# Patient Record
Sex: Female | Born: 1960 | Race: White | Hispanic: No | Marital: Married | Smoking: Former smoker
Health system: Southern US, Community
[De-identification: ages and names within clinical notes are randomized; demographics above are authoritative.]

## PROBLEM LIST (undated history)

## (undated) DIAGNOSIS — R1900 Intra-abdominal and pelvic swelling, mass and lump, unspecified site: Secondary | ICD-10-CM

## (undated) DIAGNOSIS — B029 Zoster without complications: Secondary | ICD-10-CM

## (undated) DIAGNOSIS — R05 Cough: Secondary | ICD-10-CM

## (undated) DIAGNOSIS — N200 Calculus of kidney: Secondary | ICD-10-CM

## (undated) DIAGNOSIS — M1990 Unspecified osteoarthritis, unspecified site: Secondary | ICD-10-CM

## (undated) DIAGNOSIS — B379 Candidiasis, unspecified: Secondary | ICD-10-CM

## (undated) DIAGNOSIS — C9001 Multiple myeloma in remission: Secondary | ICD-10-CM

## (undated) DIAGNOSIS — C9002 Multiple myeloma in relapse: Secondary | ICD-10-CM

## (undated) DIAGNOSIS — R300 Dysuria: Secondary | ICD-10-CM

## (undated) HISTORY — DX: Intra-abdominal and pelvic swelling, mass and lump, unspecified site: R19.00

## (undated) HISTORY — DX: Zoster without complications: B02.9

## (undated) HISTORY — DX: Multiple myeloma in remission: C90.01

## (undated) HISTORY — DX: Candidiasis, unspecified: B37.9

## (undated) HISTORY — DX: Multiple myeloma in relapse: C90.02

## (undated) HISTORY — PX: OTHER SURGICAL HISTORY: SHX169

## (undated) HISTORY — DX: Dysuria: R30.0

## (undated) HISTORY — PX: BACK SURGERY: SHX140

## (undated) HISTORY — DX: Cough: R05

---

## 1989-03-10 HISTORY — PX: PILONIDAL CYST DRAINAGE: SHX743

## 1998-09-10 ENCOUNTER — Inpatient Hospital Stay (HOSPITAL_COMMUNITY): Admission: EM | Admit: 1998-09-10 | Discharge: 1998-09-11 | Payer: Self-pay | Admitting: Emergency Medicine

## 2007-07-12 ENCOUNTER — Encounter: Admission: RE | Admit: 2007-07-12 | Discharge: 2007-07-12 | Payer: Self-pay | Admitting: Family Medicine

## 2007-07-15 ENCOUNTER — Ambulatory Visit: Payer: Self-pay | Admitting: Oncology

## 2007-07-15 ENCOUNTER — Inpatient Hospital Stay (HOSPITAL_COMMUNITY): Admission: AD | Admit: 2007-07-15 | Discharge: 2007-07-23 | Payer: Self-pay | Admitting: Neurosurgery

## 2007-07-21 ENCOUNTER — Encounter: Payer: Self-pay | Admitting: Oncology

## 2007-07-21 ENCOUNTER — Ambulatory Visit: Payer: Self-pay | Admitting: Oncology

## 2007-07-22 ENCOUNTER — Encounter (INDEPENDENT_AMBULATORY_CARE_PROVIDER_SITE_OTHER): Payer: Self-pay | Admitting: Neurological Surgery

## 2007-07-30 ENCOUNTER — Encounter: Admission: RE | Admit: 2007-07-30 | Discharge: 2007-07-30 | Payer: Self-pay | Admitting: Neurosurgery

## 2007-08-06 ENCOUNTER — Ambulatory Visit (HOSPITAL_COMMUNITY): Admission: RE | Admit: 2007-08-06 | Discharge: 2007-08-06 | Payer: Self-pay | Admitting: Oncology

## 2007-08-09 LAB — CBC WITH DIFFERENTIAL/PLATELET
Basophils Absolute: 0 10*3/uL (ref 0.0–0.1)
Eosinophils Absolute: 0 10*3/uL (ref 0.0–0.5)
HGB: 9.6 g/dL — ABNORMAL LOW (ref 11.6–15.9)
MONO#: 0.2 10*3/uL (ref 0.1–0.9)
NEUT#: 5.3 10*3/uL (ref 1.5–6.5)
RBC: 3.11 10*6/uL — ABNORMAL LOW (ref 3.70–5.32)
RDW: 15.9 % — ABNORMAL HIGH (ref 11.3–14.5)
WBC: 6.1 10*3/uL (ref 3.9–10.0)

## 2007-08-09 LAB — HCG, SERUM, QUALITATIVE: Preg, Serum: NEGATIVE

## 2007-08-12 LAB — PROTHROMBIN TIME
INR: 5 — ABNORMAL HIGH (ref 0.0–1.5)
Prothrombin Time: 48.9 seconds — ABNORMAL HIGH (ref 11.6–15.2)

## 2007-08-12 LAB — PROTIME-INR

## 2007-08-16 LAB — HCG, SERUM, QUALITATIVE: Preg, Serum: NEGATIVE

## 2007-08-16 LAB — CBC WITH DIFFERENTIAL/PLATELET
BASO%: 0.3 % (ref 0.0–2.0)
EOS%: 0.1 % (ref 0.0–7.0)
HCT: 29 % — ABNORMAL LOW (ref 34.8–46.6)
MCH: 30.7 pg (ref 26.0–34.0)
MCHC: 34 g/dL (ref 32.0–36.0)
MONO#: 0.2 10*3/uL (ref 0.1–0.9)
NEUT%: 84.4 % — ABNORMAL HIGH (ref 39.6–76.8)
RBC: 3.21 10*6/uL — ABNORMAL LOW (ref 3.70–5.32)
RDW: 15.9 % — ABNORMAL HIGH (ref 11.3–14.5)
WBC: 5.3 10*3/uL (ref 3.9–10.0)
lymph#: 0.6 10*3/uL — ABNORMAL LOW (ref 0.9–3.3)

## 2007-08-16 LAB — PROTIME-INR

## 2007-08-17 LAB — CBC WITH DIFFERENTIAL/PLATELET
Basophils Absolute: 0 10*3/uL (ref 0.0–0.1)
Eosinophils Absolute: 0.6 10*3/uL — ABNORMAL HIGH (ref 0.0–0.5)
HCT: 28.3 % — ABNORMAL LOW (ref 34.8–46.6)
HGB: 9.5 g/dL — ABNORMAL LOW (ref 11.6–15.9)
LYMPH%: 8.8 % — ABNORMAL LOW (ref 14.0–48.0)
MCV: 91.1 fL (ref 81.0–101.0)
MONO#: 0.5 10*3/uL (ref 0.1–0.9)
MONO%: 6.1 % (ref 0.0–13.0)
NEUT#: 6.1 10*3/uL (ref 1.5–6.5)
NEUT%: 77.4 % — ABNORMAL HIGH (ref 39.6–76.8)
Platelets: 101 10*3/uL — ABNORMAL LOW (ref 145–400)
WBC: 7.8 10*3/uL (ref 3.9–10.0)

## 2007-08-17 LAB — MORPHOLOGY: PLT EST: DECREASED

## 2007-08-19 LAB — PROTIME-INR
INR: 2.6 (ref 2.00–3.50)
Protime: 31.2 Seconds — ABNORMAL HIGH (ref 10.6–13.4)

## 2007-08-23 LAB — COMPREHENSIVE METABOLIC PANEL
ALT: 16 U/L (ref 0–35)
AST: 12 U/L (ref 0–37)
Albumin: 4.3 g/dL (ref 3.5–5.2)
Alkaline Phosphatase: 94 U/L (ref 39–117)
BUN: 8 mg/dL (ref 6–23)
Calcium: 8.8 mg/dL (ref 8.4–10.5)
Chloride: 98 mEq/L (ref 96–112)
Potassium: 3.4 mEq/L — ABNORMAL LOW (ref 3.5–5.3)
Sodium: 136 mEq/L (ref 135–145)

## 2007-08-23 LAB — LACTATE DEHYDROGENASE: LDH: 179 U/L (ref 94–250)

## 2007-08-23 LAB — PROTIME-INR
INR: 4.2 — ABNORMAL HIGH (ref 2.00–3.50)
Protime: 50.4 Seconds — ABNORMAL HIGH (ref 10.6–13.4)

## 2007-08-23 LAB — KAPPA/LAMBDA LIGHT CHAINS: Kappa:Lambda Ratio: 31.15 — ABNORMAL HIGH (ref 0.26–1.65)

## 2007-08-27 ENCOUNTER — Emergency Department (HOSPITAL_COMMUNITY): Admission: EM | Admit: 2007-08-27 | Discharge: 2007-08-27 | Payer: Self-pay | Admitting: Family Medicine

## 2007-08-30 LAB — CBC WITH DIFFERENTIAL/PLATELET
Basophils Absolute: 0.2 10*3/uL — ABNORMAL HIGH (ref 0.0–0.1)
EOS%: 1.9 % (ref 0.0–7.0)
HGB: 10.4 g/dL — ABNORMAL LOW (ref 11.6–15.9)
MCH: 31.1 pg (ref 26.0–34.0)
MCV: 90.3 fL (ref 81.0–101.0)
MONO%: 13 % (ref 0.0–13.0)
RBC: 3.35 10*6/uL — ABNORMAL LOW (ref 3.70–5.32)
RDW: 15.5 % — ABNORMAL HIGH (ref 11.3–14.5)

## 2007-08-30 LAB — PROTIME-INR: INR: 3.1 (ref 2.00–3.50)

## 2007-08-31 ENCOUNTER — Ambulatory Visit: Payer: Self-pay | Admitting: Oncology

## 2007-09-02 LAB — PROTIME-INR: INR: 1.4 — ABNORMAL LOW (ref 2.00–3.50)

## 2007-09-06 LAB — CBC WITH DIFFERENTIAL/PLATELET
Basophils Absolute: 0.1 10*3/uL (ref 0.0–0.1)
EOS%: 6.6 % (ref 0.0–7.0)
HCT: 27.8 % — ABNORMAL LOW (ref 34.8–46.6)
HGB: 9.5 g/dL — ABNORMAL LOW (ref 11.6–15.9)
LYMPH%: 16.9 % (ref 14.0–48.0)
MCH: 30.6 pg (ref 26.0–34.0)
MCV: 89.5 fL (ref 81.0–101.0)
MONO%: 8.2 % (ref 0.0–13.0)
NEUT%: 66.5 % (ref 39.6–76.8)
Platelets: 176 10*3/uL (ref 145–400)
lymph#: 1.3 10*3/uL (ref 0.9–3.3)

## 2007-09-13 LAB — COMPREHENSIVE METABOLIC PANEL
AST: 17 U/L (ref 0–37)
Alkaline Phosphatase: 102 U/L (ref 39–117)
BUN: 8 mg/dL (ref 6–23)
Calcium: 7.3 mg/dL — ABNORMAL LOW (ref 8.4–10.5)
Creatinine, Ser: 0.62 mg/dL (ref 0.40–1.20)

## 2007-09-13 LAB — CBC WITH DIFFERENTIAL/PLATELET
Basophils Absolute: 0.1 10*3/uL (ref 0.0–0.1)
EOS%: 4.9 % (ref 0.0–7.0)
HCT: 28.8 % — ABNORMAL LOW (ref 34.8–46.6)
HGB: 9.7 g/dL — ABNORMAL LOW (ref 11.6–15.9)
LYMPH%: 7.8 % — ABNORMAL LOW (ref 14.0–48.0)
MCH: 30.9 pg (ref 26.0–34.0)
MCV: 91.6 fL (ref 81.0–101.0)
MONO%: 6.3 % (ref 0.0–13.0)
NEUT%: 79.8 % — ABNORMAL HIGH (ref 39.6–76.8)

## 2007-09-14 LAB — KAPPA/LAMBDA LIGHT CHAINS: Lambda Free Lght Chn: <0.5 mg/dL — ABNORMAL LOW (ref 0.57–2.63)

## 2007-09-20 LAB — PROTIME-INR
INR: 1.4 — ABNORMAL LOW (ref 2.00–3.50)
Protime: 16.8 Seconds — ABNORMAL HIGH (ref 10.6–13.4)

## 2007-09-20 LAB — HCG, SERUM, QUALITATIVE: Preg, Serum: NEGATIVE

## 2007-09-20 LAB — CBC WITH DIFFERENTIAL/PLATELET
BASO%: 3 % — ABNORMAL HIGH (ref 0.0–2.0)
EOS%: 14.1 % — ABNORMAL HIGH (ref 0.0–7.0)
HCT: 26.2 % — ABNORMAL LOW (ref 34.8–46.6)
LYMPH%: 17.2 % (ref 14.0–48.0)
MCH: 31.7 pg (ref 26.0–34.0)
MCHC: 33.3 g/dL (ref 32.0–36.0)
MONO#: 0.7 10*3/uL (ref 0.1–0.9)
MONO%: 11.9 % (ref 0.0–13.0)
NEUT%: 53.8 % (ref 39.6–76.8)
Platelets: 223 10*3/uL (ref 145–400)
RBC: 2.75 10*6/uL — ABNORMAL LOW (ref 3.70–5.32)
WBC: 6.1 10*3/uL (ref 3.9–10.0)

## 2007-09-20 LAB — BASIC METABOLIC PANEL
BUN: 6 mg/dL (ref 6–23)
Calcium: 6.5 mg/dL — ABNORMAL LOW (ref 8.4–10.5)
Chloride: 102 mEq/L (ref 96–112)
Creatinine, Ser: 0.56 mg/dL (ref 0.40–1.20)

## 2007-09-27 LAB — BASIC METABOLIC PANEL
BUN: 5 mg/dL — ABNORMAL LOW (ref 6–23)
CO2: 25 mEq/L (ref 19–32)
Calcium: 7.3 mg/dL — ABNORMAL LOW (ref 8.4–10.5)
Creatinine, Ser: 0.64 mg/dL (ref 0.40–1.20)

## 2007-09-27 LAB — CBC WITH DIFFERENTIAL/PLATELET
Eosinophils Absolute: 1.2 10*3/uL — ABNORMAL HIGH (ref 0.0–0.5)
HGB: 9.6 g/dL — ABNORMAL LOW (ref 11.6–15.9)
LYMPH%: 16.2 % (ref 14.0–48.0)
MONO#: 0.4 10*3/uL (ref 0.1–0.9)
NEUT%: 51.6 % (ref 39.6–76.8)
Platelets: 184 10*3/uL (ref 145–400)

## 2007-09-27 LAB — PROTIME-INR
INR: 1.5 — ABNORMAL LOW (ref 2.00–3.50)
Protime: 18 Seconds — ABNORMAL HIGH (ref 10.6–13.4)

## 2007-09-29 LAB — CREATININE CLEARANCE, URINE, 24 HOUR
Collection Interval-CRCL: 24 hours
Urine Total Volume-CRCL: 1600 mL

## 2007-09-29 LAB — UIFE/LIGHT CHAINS/TP QN, 24-HR UR
Albumin, U: DETECTED
Beta, Urine: DETECTED — AB
Free Lambda Lt Chains,Ur: <0.05 mg/dL (ref 0.08–1.01)
Total Protein, Urine-Ur/day: 29 mg/d (ref 10–140)
Total Protein, Urine: 1.8 mg/dL
Volume, Urine: 1600 mL

## 2007-10-04 LAB — CBC WITH DIFFERENTIAL/PLATELET
BASO%: 2.8 % — ABNORMAL HIGH (ref 0.0–2.0)
EOS%: 7.7 % — ABNORMAL HIGH (ref 0.0–7.0)
HCT: 29.5 % — ABNORMAL LOW (ref 34.8–46.6)
MCH: 30.9 pg (ref 26.0–34.0)
MCHC: 32.7 g/dL (ref 32.0–36.0)
MONO#: 0.6 10*3/uL (ref 0.1–0.9)
RBC: 3.12 10*6/uL — ABNORMAL LOW (ref 3.70–5.32)
RDW: 16.7 % — ABNORMAL HIGH (ref 11.3–14.5)
WBC: 4.9 10*3/uL (ref 3.9–10.0)
lymph#: 0.8 10*3/uL — ABNORMAL LOW (ref 0.9–3.3)

## 2007-10-04 LAB — PROTIME-INR
INR: 1.7 — ABNORMAL LOW (ref 2.00–3.50)
Protime: 20.4 Seconds — ABNORMAL HIGH (ref 10.6–13.4)

## 2007-10-05 LAB — COMPREHENSIVE METABOLIC PANEL
ALT: 13 U/L (ref 0–35)
Alkaline Phosphatase: 68 U/L (ref 39–117)
Creatinine, Ser: 0.62 mg/dL (ref 0.40–1.20)
Sodium: 136 mEq/L (ref 135–145)
Total Bilirubin: 0.8 mg/dL (ref 0.3–1.2)
Total Protein: 5.6 g/dL — ABNORMAL LOW (ref 6.0–8.3)

## 2007-10-05 LAB — KAPPA/LAMBDA LIGHT CHAINS
Kappa:Lambda Ratio: 0.91 (ref 0.26–1.65)
Lambda Free Lght Chn: 0.7 mg/dL (ref 0.57–2.63)

## 2007-10-11 ENCOUNTER — Ambulatory Visit (HOSPITAL_COMMUNITY): Admission: RE | Admit: 2007-10-11 | Discharge: 2007-10-11 | Payer: Self-pay | Admitting: Oncology

## 2007-10-11 LAB — PROTIME-INR
INR: 1.9 — ABNORMAL LOW (ref 2.00–3.50)
Protime: 22.8 Seconds — ABNORMAL HIGH (ref 10.6–13.4)

## 2007-10-14 ENCOUNTER — Ambulatory Visit: Payer: Self-pay | Admitting: Oncology

## 2007-10-14 ENCOUNTER — Encounter: Payer: Self-pay | Admitting: Oncology

## 2007-10-14 ENCOUNTER — Ambulatory Visit (HOSPITAL_COMMUNITY): Admission: RE | Admit: 2007-10-14 | Discharge: 2007-10-14 | Payer: Self-pay | Admitting: Oncology

## 2007-10-18 ENCOUNTER — Ambulatory Visit: Payer: Self-pay | Admitting: Oncology

## 2007-10-18 LAB — CBC WITH DIFFERENTIAL/PLATELET
BASO%: 4.9 % — ABNORMAL HIGH (ref 0.0–2.0)
Basophils Absolute: 0.2 10*3/uL — ABNORMAL HIGH (ref 0.0–0.1)
EOS%: 10.8 % — ABNORMAL HIGH (ref 0.0–7.0)
HGB: 9.2 g/dL — ABNORMAL LOW (ref 11.6–15.9)
MCH: 31 pg (ref 26.0–34.0)
MCHC: 32.5 g/dL (ref 32.0–36.0)
MCV: 95.5 fL (ref 81.0–101.0)
MONO%: 15.1 % — ABNORMAL HIGH (ref 0.0–13.0)
RBC: 2.96 10*6/uL — ABNORMAL LOW (ref 3.70–5.32)
RDW: 16.9 % — ABNORMAL HIGH (ref 11.3–14.5)

## 2007-10-25 LAB — CBC WITH DIFFERENTIAL/PLATELET
BASO%: 2.2 % — ABNORMAL HIGH (ref 0.0–2.0)
EOS%: 10.7 % — ABNORMAL HIGH (ref 0.0–7.0)
HCT: 30.8 % — ABNORMAL LOW (ref 34.8–46.6)
LYMPH%: 19.6 % (ref 14.0–48.0)
MCH: 31.1 pg (ref 26.0–34.0)
MCHC: 32.4 g/dL (ref 32.0–36.0)
MCV: 95.9 fL (ref 81.0–101.0)
MONO%: 8.2 % (ref 0.0–13.0)
NEUT%: 59.3 % (ref 39.6–76.8)
Platelets: 150 10*3/uL (ref 145–400)
lymph#: 0.9 10*3/uL (ref 0.9–3.3)

## 2007-10-25 LAB — PROTIME-INR

## 2007-11-01 LAB — CBC WITH DIFFERENTIAL/PLATELET
BASO%: 0.8 % (ref 0.0–2.0)
Basophils Absolute: 0 10*3/uL (ref 0.0–0.1)
EOS%: 7.5 % — ABNORMAL HIGH (ref 0.0–7.0)
HCT: 28.8 % — ABNORMAL LOW (ref 34.8–46.6)
HGB: 9.7 g/dL — ABNORMAL LOW (ref 11.6–15.9)
LYMPH%: 18.8 % (ref 14.0–48.0)
MCH: 31.7 pg (ref 26.0–34.0)
MCHC: 33.7 g/dL (ref 32.0–36.0)
MCV: 94.1 fL (ref 81.0–101.0)
NEUT%: 55.1 % (ref 39.6–76.8)
Platelets: 142 10*3/uL — ABNORMAL LOW (ref 145–400)

## 2007-11-09 LAB — CBC WITH DIFFERENTIAL/PLATELET
BASO%: 0.9 % (ref 0.0–2.0)
EOS%: 2 % (ref 0.0–7.0)
HCT: 28.8 % — ABNORMAL LOW (ref 34.8–46.6)
MCH: 32.2 pg (ref 26.0–34.0)
MCHC: 33.9 g/dL (ref 32.0–36.0)
NEUT%: 58.4 % (ref 39.6–76.8)
RBC: 3.04 10*6/uL — ABNORMAL LOW (ref 3.70–5.32)
RDW: 18.4 % — ABNORMAL HIGH (ref 11.3–14.5)
lymph#: 0.9 10*3/uL (ref 0.9–3.3)

## 2007-11-09 LAB — PROTIME-INR: Protime: 16.8 Seconds — ABNORMAL HIGH (ref 10.6–13.4)

## 2007-11-09 LAB — COMPREHENSIVE METABOLIC PANEL
ALT: 8 U/L (ref 0–35)
CO2: 25 mEq/L (ref 19–32)
Creatinine, Ser: 0.87 mg/dL (ref 0.40–1.20)
Total Bilirubin: 0.5 mg/dL (ref 0.3–1.2)

## 2007-11-09 LAB — LACTATE DEHYDROGENASE: LDH: 201 U/L (ref 94–250)

## 2007-11-16 LAB — BASIC METABOLIC PANEL
BUN: 8 mg/dL (ref 6–23)
Calcium: 8.8 mg/dL (ref 8.4–10.5)
Creatinine, Ser: 0.74 mg/dL (ref 0.40–1.20)

## 2007-11-16 LAB — CBC WITH DIFFERENTIAL/PLATELET
EOS%: 2.2 % (ref 0.0–7.0)
HGB: 9.8 g/dL — ABNORMAL LOW (ref 11.6–15.9)
MCH: 31.5 pg (ref 26.0–34.0)
MCV: 94.8 fL (ref 81.0–101.0)
MONO%: 15 % — ABNORMAL HIGH (ref 0.0–13.0)
NEUT#: 1.7 10*3/uL (ref 1.5–6.5)
RBC: 3.11 10*6/uL — ABNORMAL LOW (ref 3.70–5.32)
RDW: 16.4 % — ABNORMAL HIGH (ref 11.3–14.5)
lymph#: 1.1 10*3/uL (ref 0.9–3.3)

## 2007-11-16 LAB — PROTIME-INR
INR: 1.2 — ABNORMAL LOW (ref 2.00–3.50)
Protime: 14.4 Seconds — ABNORMAL HIGH (ref 10.6–13.4)

## 2007-12-09 ENCOUNTER — Ambulatory Visit: Payer: Self-pay | Admitting: Oncology

## 2007-12-13 LAB — CBC WITH DIFFERENTIAL/PLATELET
BASO%: 1.1 % (ref 0.0–2.0)
EOS%: 3.8 % (ref 0.0–7.0)
HCT: 32.7 % — ABNORMAL LOW (ref 34.8–46.6)
LYMPH%: 32.9 % (ref 14.0–48.0)
MCH: 31.8 pg (ref 26.0–34.0)
MCHC: 33.5 g/dL (ref 32.0–36.0)
MCV: 95 fL (ref 81.0–101.0)
MONO%: 11.9 % (ref 0.0–13.0)
NEUT%: 50.3 % (ref 39.6–76.8)
Platelets: 127 10*3/uL — ABNORMAL LOW (ref 145–400)

## 2007-12-14 ENCOUNTER — Ambulatory Visit (HOSPITAL_COMMUNITY): Admission: RE | Admit: 2007-12-14 | Discharge: 2007-12-14 | Payer: Self-pay | Admitting: Oncology

## 2008-01-27 ENCOUNTER — Ambulatory Visit: Payer: Self-pay | Admitting: Oncology

## 2008-01-31 LAB — COMPREHENSIVE METABOLIC PANEL
ALT: 14 U/L (ref 0–35)
BUN: 6 mg/dL (ref 6–23)
CO2: 24 mEq/L (ref 19–32)
Calcium: 8.9 mg/dL (ref 8.4–10.5)
Chloride: 102 mEq/L (ref 96–112)
Creatinine, Ser: 0.7 mg/dL (ref 0.40–1.20)
Glucose, Bld: 125 mg/dL — ABNORMAL HIGH (ref 70–99)
Total Bilirubin: 0.8 mg/dL (ref 0.3–1.2)

## 2008-01-31 LAB — CBC WITH DIFFERENTIAL/PLATELET
BASO%: 1.3 % (ref 0.0–2.0)
EOS%: 2 % (ref 0.0–7.0)
HCT: 28.9 % — ABNORMAL LOW (ref 34.8–46.6)
LYMPH%: 20.8 % (ref 14.0–48.0)
MCH: 33.3 pg (ref 26.0–34.0)
MCHC: 35 g/dL (ref 32.0–36.0)
MONO%: 15.6 % — ABNORMAL HIGH (ref 0.0–13.0)
NEUT%: 60.3 % (ref 39.6–76.8)
Platelets: 113 10*3/uL — ABNORMAL LOW (ref 145–400)
RBC: 3.03 10*6/uL — ABNORMAL LOW (ref 3.70–5.32)
WBC: 3.5 10*3/uL — ABNORMAL LOW (ref 3.9–10.0)

## 2008-01-31 LAB — MORPHOLOGY

## 2008-03-13 ENCOUNTER — Ambulatory Visit: Payer: Self-pay | Admitting: Oncology

## 2008-03-15 ENCOUNTER — Ambulatory Visit: Payer: Self-pay | Admitting: Oncology

## 2008-03-15 LAB — CBC WITH DIFFERENTIAL/PLATELET
BASO%: 1 % (ref 0.0–2.0)
Basophils Absolute: 0 10*3/uL (ref 0.0–0.1)
EOS%: 3.8 % (ref 0.0–7.0)
Eosinophils Absolute: 0.1 10*3/uL (ref 0.0–0.5)
HCT: 30.2 % — ABNORMAL LOW (ref 34.8–46.6)
HGB: 10.5 g/dL — ABNORMAL LOW (ref 11.6–15.9)
LYMPH%: 30.9 % (ref 14.0–48.0)
MCH: 36.2 pg — ABNORMAL HIGH (ref 26.0–34.0)
MCHC: 34.8 g/dL (ref 32.0–36.0)
MCV: 104.1 fL — ABNORMAL HIGH (ref 81.0–101.0)
MONO#: 0.4 10*3/uL (ref 0.1–0.9)
MONO%: 13.7 % — ABNORMAL HIGH (ref 0.0–13.0)
NEUT#: 1.3 10*3/uL — ABNORMAL LOW (ref 1.5–6.5)
NEUT%: 50.6 % (ref 39.6–76.8)
Platelets: 127 10*3/uL — ABNORMAL LOW (ref 145–400)
RBC: 2.9 10*6/uL — ABNORMAL LOW (ref 3.70–5.32)
RDW: 16.4 % — ABNORMAL HIGH (ref 11.3–14.5)
WBC: 2.6 10*3/uL — ABNORMAL LOW (ref 3.9–10.0)
lymph#: 0.8 10*3/uL — ABNORMAL LOW (ref 0.9–3.3)

## 2008-03-15 LAB — MORPHOLOGY

## 2008-03-17 LAB — COMPREHENSIVE METABOLIC PANEL
Albumin: 3.7 g/dL (ref 3.5–5.2)
BUN: 5 mg/dL — ABNORMAL LOW (ref 6–23)
CO2: 25 mEq/L (ref 19–32)
Calcium: 8.6 mg/dL (ref 8.4–10.5)
Chloride: 106 mEq/L (ref 96–112)
Creatinine, Ser: 0.61 mg/dL (ref 0.40–1.20)

## 2008-03-17 LAB — BETA 2 MICROGLOBULIN, SERUM: Beta-2 Microglobulin: 2.02 mg/L — ABNORMAL HIGH (ref 1.01–1.73)

## 2008-03-17 LAB — LACTATE DEHYDROGENASE: LDH: 169 U/L (ref 94–250)

## 2008-03-17 LAB — IMMUNOFIXATION ELECTROPHORESIS
IgA: <7 mg/dL — ABNORMAL LOW (ref 68–378)
IgG (Immunoglobin G), Serum: 263 mg/dL — ABNORMAL LOW (ref 694–1618)
Total Protein, Serum Electrophoresis: 5.1 g/dL — ABNORMAL LOW (ref 6.0–8.3)

## 2008-03-17 LAB — KAPPA/LAMBDA LIGHT CHAINS: Kappa free light chain: 0.71 mg/dL (ref 0.33–1.94)

## 2008-03-21 LAB — T3: T3, Total: 143.6 ng/dL (ref 80.0–204.0)

## 2008-03-21 LAB — BASIC METABOLIC PANEL
CO2: 23 mEq/L (ref 19–32)
Calcium: 8.5 mg/dL (ref 8.4–10.5)
Glucose, Bld: 93 mg/dL (ref 70–99)
Potassium: 3.7 mEq/L (ref 3.5–5.3)
Sodium: 139 mEq/L (ref 135–145)

## 2008-04-14 ENCOUNTER — Encounter: Admission: RE | Admit: 2008-04-14 | Discharge: 2008-06-23 | Payer: Self-pay | Admitting: Oncology

## 2008-04-18 LAB — BASIC METABOLIC PANEL
BUN: 7 mg/dL (ref 6–23)
Glucose, Bld: 96 mg/dL (ref 70–99)
Potassium: 3.7 mEq/L (ref 3.5–5.3)

## 2008-05-05 ENCOUNTER — Ambulatory Visit: Payer: Self-pay | Admitting: Oncology

## 2008-05-09 LAB — CBC WITH DIFFERENTIAL/PLATELET
BASO%: 0.7 % (ref 0.0–2.0)
HCT: 34.4 % — ABNORMAL LOW (ref 34.8–46.6)
LYMPH%: 24.1 % (ref 14.0–49.7)
MCH: 34.9 pg — ABNORMAL HIGH (ref 25.1–34.0)
MCHC: 35.2 g/dL (ref 31.5–36.0)
MCV: 99.1 fL (ref 79.5–101.0)
MONO#: 0.4 10*3/uL (ref 0.1–0.9)
MONO%: 10.9 % (ref 0.0–14.0)
NEUT%: 60.5 % (ref 38.4–76.8)
Platelets: 127 10*3/uL — ABNORMAL LOW (ref 145–400)
WBC: 3.3 10*3/uL — ABNORMAL LOW (ref 3.9–10.3)

## 2008-05-11 LAB — LACTATE DEHYDROGENASE: LDH: 158 U/L (ref 94–250)

## 2008-05-11 LAB — COMPREHENSIVE METABOLIC PANEL
CO2: 29 mEq/L (ref 19–32)
Creatinine, Ser: 0.73 mg/dL (ref 0.40–1.20)
Glucose, Bld: 109 mg/dL — ABNORMAL HIGH (ref 70–99)
Total Bilirubin: 0.4 mg/dL (ref 0.3–1.2)
Total Protein: 6.1 g/dL (ref 6.0–8.3)

## 2008-05-11 LAB — IMMUNOFIXATION ELECTROPHORESIS
IgA: <7 mg/dL — ABNORMAL LOW (ref 68–378)
IgM, Serum: 12 mg/dL — ABNORMAL LOW (ref 60–263)
Total Protein, Serum Electrophoresis: 6.1 g/dL (ref 6.0–8.3)

## 2008-05-11 LAB — KAPPA/LAMBDA LIGHT CHAINS

## 2008-05-16 LAB — BASIC METABOLIC PANEL
BUN: 10 mg/dL (ref 6–23)
Chloride: 107 mEq/L (ref 96–112)
Potassium: 3.9 mEq/L (ref 3.5–5.3)

## 2008-05-30 ENCOUNTER — Encounter: Admission: RE | Admit: 2008-05-30 | Discharge: 2008-05-30 | Payer: Self-pay | Admitting: Neurological Surgery

## 2008-05-31 ENCOUNTER — Encounter: Admission: RE | Admit: 2008-05-31 | Discharge: 2008-05-31 | Payer: Self-pay | Admitting: Neurological Surgery

## 2008-06-08 ENCOUNTER — Encounter: Admission: RE | Admit: 2008-06-08 | Discharge: 2008-06-08 | Payer: Self-pay | Admitting: Neurological Surgery

## 2008-06-13 ENCOUNTER — Encounter: Payer: Self-pay | Admitting: Family Medicine

## 2008-06-13 LAB — CBC WITH DIFFERENTIAL/PLATELET
BASO%: 0.4 % (ref 0.0–2.0)
Eosinophils Absolute: 0.1 10*3/uL (ref 0.0–0.5)
HCT: 31 % — ABNORMAL LOW (ref 34.8–46.6)
LYMPH%: 15.4 % (ref 14.0–49.7)
MCHC: 34.7 g/dL (ref 31.5–36.0)
MCV: 96.3 fL (ref 79.5–101.0)
MONO#: 0.6 10*3/uL (ref 0.1–0.9)
MONO%: 8.5 % (ref 0.0–14.0)
NEUT%: 74.5 % (ref 38.4–76.8)
Platelets: 158 10*3/uL (ref 145–400)
WBC: 6.6 10*3/uL (ref 3.9–10.3)

## 2008-06-13 LAB — MORPHOLOGY: PLT EST: ADEQUATE

## 2008-06-14 LAB — COMPREHENSIVE METABOLIC PANEL
AST: 12 U/L (ref 0–37)
Alkaline Phosphatase: 68 U/L (ref 39–117)
BUN: 7 mg/dL (ref 6–23)
Glucose, Bld: 100 mg/dL — ABNORMAL HIGH (ref 70–99)
Total Bilirubin: 0.5 mg/dL (ref 0.3–1.2)

## 2008-06-14 LAB — KAPPA/LAMBDA LIGHT CHAINS
Kappa free light chain: <0.6 mg/dL (ref 0.33–1.94)
Lambda Free Lght Chn: <0.49 mg/dL — ABNORMAL LOW (ref 0.57–2.63)

## 2008-06-14 LAB — IGG: IgG (Immunoglobin G), Serum: 267 mg/dL — ABNORMAL LOW (ref 694–1618)

## 2008-06-15 LAB — UIFE/LIGHT CHAINS/TP QN, 24-HR UR
Free Kappa Lt Chains,Ur: 1.83 mg/dL — ABNORMAL HIGH (ref 0.04–1.51)
Free Lambda Lt Chains,Ur: 0.17 mg/dL (ref 0.08–1.01)
Free Lt Chn Excr Rate: 30.65 mg/d
Time: 24 hours
Volume, Urine: 1675 mL

## 2008-06-20 ENCOUNTER — Ambulatory Visit: Payer: Self-pay | Admitting: Oncology

## 2008-08-01 LAB — CBC WITH DIFFERENTIAL/PLATELET
BASO%: 0.5 % (ref 0.0–2.0)
Eosinophils Absolute: 0.1 10*3/uL (ref 0.0–0.5)
MCHC: 33.2 g/dL (ref 31.5–36.0)
MONO#: 0.4 10*3/uL (ref 0.1–0.9)
NEUT#: 2.4 10*3/uL (ref 1.5–6.5)
Platelets: 104 10*3/uL — ABNORMAL LOW (ref 145–400)
RBC: 3.45 10*6/uL — ABNORMAL LOW (ref 3.70–5.45)
RDW: 13.6 % (ref 11.2–14.5)
WBC: 3.9 10*3/uL (ref 3.9–10.3)
lymph#: 0.9 10*3/uL (ref 0.9–3.3)
nRBC: 0 % (ref 0–0)

## 2008-08-01 LAB — MORPHOLOGY
PLT EST: DECREASED
RBC Comments: NORMAL

## 2008-08-02 LAB — COMPREHENSIVE METABOLIC PANEL WITH GFR
ALT: 9 U/L (ref 0–35)
AST: 19 U/L (ref 0–37)
Albumin: 4.4 g/dL (ref 3.5–5.2)
Alkaline Phosphatase: 56 U/L (ref 39–117)
BUN: 15 mg/dL (ref 6–23)
CO2: 28 meq/L (ref 19–32)
Calcium: 9.1 mg/dL (ref 8.4–10.5)
Chloride: 105 meq/L (ref 96–112)
Creatinine, Ser: 0.78 mg/dL (ref 0.40–1.20)
Glucose, Bld: 77 mg/dL (ref 70–99)
Potassium: 4 meq/L (ref 3.5–5.3)
Sodium: 142 meq/L (ref 135–145)
Total Bilirubin: 0.4 mg/dL (ref 0.3–1.2)
Total Protein: 6.3 g/dL (ref 6.0–8.3)

## 2008-08-02 LAB — KAPPA/LAMBDA LIGHT CHAINS
Kappa free light chain: <0.6 mg/dL (ref 0.33–1.94)
Lambda Free Lght Chn: 0.94 mg/dL (ref 0.57–2.63)

## 2008-08-03 ENCOUNTER — Ambulatory Visit: Payer: Self-pay | Admitting: Oncology

## 2008-08-08 LAB — CBC WITH DIFFERENTIAL/PLATELET
Basophils Absolute: 0 10*3/uL (ref 0.0–0.1)
EOS%: 4.2 % (ref 0.0–7.0)
Eosinophils Absolute: 0.2 10*3/uL (ref 0.0–0.5)
HCT: 31.5 % — ABNORMAL LOW (ref 34.8–46.6)
HGB: 11 g/dL — ABNORMAL LOW (ref 11.6–15.9)
MCH: 33.9 pg (ref 25.1–34.0)
MCV: 97.1 fL (ref 79.5–101.0)
MONO%: 8.3 % (ref 0.0–14.0)
NEUT#: 3.4 10*3/uL (ref 1.5–6.5)
NEUT%: 70.3 % (ref 38.4–76.8)

## 2008-08-08 LAB — BASIC METABOLIC PANEL
BUN: 12 mg/dL (ref 6–23)
Chloride: 106 mEq/L (ref 96–112)
Creatinine, Ser: 0.73 mg/dL (ref 0.40–1.20)
Glucose, Bld: 97 mg/dL (ref 70–99)

## 2008-08-15 LAB — CBC WITH DIFFERENTIAL/PLATELET
EOS%: 2.7 % (ref 0.0–7.0)
Eosinophils Absolute: 0.1 10*3/uL (ref 0.0–0.5)
MCH: 34.3 pg — ABNORMAL HIGH (ref 25.1–34.0)
MCV: 97.9 fL (ref 79.5–101.0)
MONO%: 8.9 % (ref 0.0–14.0)
NEUT#: 1.6 10*3/uL (ref 1.5–6.5)
RBC: 3.52 10*6/uL — ABNORMAL LOW (ref 3.70–5.45)
RDW: 14.1 % (ref 11.2–14.5)
lymph#: 0.8 10*3/uL — ABNORMAL LOW (ref 0.9–3.3)

## 2008-08-22 LAB — CBC WITH DIFFERENTIAL/PLATELET
Eosinophils Absolute: 0.1 10*3/uL (ref 0.0–0.5)
MONO#: 0.4 10*3/uL (ref 0.1–0.9)
NEUT#: 3.1 10*3/uL (ref 1.5–6.5)
RBC: 3.44 10*6/uL — ABNORMAL LOW (ref 3.70–5.45)
RDW: 13.9 % (ref 11.2–14.5)
WBC: 4.4 10*3/uL (ref 3.9–10.3)

## 2008-08-22 LAB — MORPHOLOGY
PLT EST: DECREASED
RBC Comments: NORMAL

## 2008-09-05 ENCOUNTER — Ambulatory Visit: Payer: Self-pay | Admitting: Oncology

## 2008-09-05 LAB — CBC WITH DIFFERENTIAL/PLATELET
Eosinophils Absolute: 0.2 10*3/uL (ref 0.0–0.5)
MONO#: 0.4 10*3/uL (ref 0.1–0.9)
MONO%: 11.8 % (ref 0.0–14.0)
NEUT#: 1.9 10*3/uL (ref 1.5–6.5)
RBC: 4.12 10*6/uL (ref 3.70–5.45)
RDW: 13 % (ref 11.2–14.5)
WBC: 3.7 10*3/uL — ABNORMAL LOW (ref 3.9–10.3)
nRBC: 0 % (ref 0–0)

## 2008-09-07 LAB — COMPREHENSIVE METABOLIC PANEL
ALT: 8 U/L (ref 0–35)
CO2: 26 mEq/L (ref 19–32)
Sodium: 141 mEq/L (ref 135–145)
Total Bilirubin: 0.3 mg/dL (ref 0.3–1.2)
Total Protein: 6 g/dL (ref 6.0–8.3)

## 2008-09-07 LAB — IMMUNOFIXATION ELECTROPHORESIS: IgG (Immunoglobin G), Serum: 472 mg/dL — ABNORMAL LOW (ref 694–1618)

## 2008-09-07 LAB — KAPPA/LAMBDA LIGHT CHAINS
Kappa free light chain: <0.6 mg/dL (ref 0.33–1.94)
Lambda Free Lght Chn: 0.58 mg/dL (ref 0.57–2.63)

## 2008-09-07 LAB — LACTATE DEHYDROGENASE: LDH: 180 U/L (ref 94–250)

## 2008-09-26 LAB — CBC WITH DIFFERENTIAL/PLATELET
Basophils Absolute: 0 10*3/uL (ref 0.0–0.1)
Eosinophils Absolute: 0.1 10*3/uL (ref 0.0–0.5)
HCT: 34.3 % — ABNORMAL LOW (ref 34.8–46.6)
HGB: 11.8 g/dL (ref 11.6–15.9)
LYMPH%: 27.8 % (ref 14.0–49.7)
MCHC: 34.5 g/dL (ref 31.5–36.0)
MONO#: 0.3 10*3/uL (ref 0.1–0.9)
NEUT#: 1.8 10*3/uL (ref 1.5–6.5)
NEUT%: 59.6 % (ref 38.4–76.8)
Platelets: 90 10*3/uL — ABNORMAL LOW (ref 145–400)
WBC: 3.1 10*3/uL — ABNORMAL LOW (ref 3.9–10.3)

## 2008-09-26 LAB — MORPHOLOGY

## 2008-09-28 ENCOUNTER — Ambulatory Visit: Payer: Self-pay | Admitting: Oncology

## 2008-10-03 LAB — BASIC METABOLIC PANEL
CO2: 27 mEq/L (ref 19–32)
Chloride: 105 mEq/L (ref 96–112)
Potassium: 4.1 mEq/L (ref 3.5–5.3)
Sodium: 140 mEq/L (ref 135–145)

## 2008-10-10 LAB — CBC WITH DIFFERENTIAL/PLATELET
Basophils Absolute: 0.1 10*3/uL (ref 0.0–0.1)
Eosinophils Absolute: 0.1 10*3/uL (ref 0.0–0.5)
HGB: 12.8 g/dL (ref 11.6–15.9)
MCV: 95 fL (ref 79.5–101.0)
MONO%: 10.2 % (ref 0.0–14.0)
NEUT#: 2.2 10*3/uL (ref 1.5–6.5)
RBC: 3.99 10*6/uL (ref 3.70–5.45)
RDW: 12.5 % (ref 11.2–14.5)
WBC: 4.1 10*3/uL (ref 3.9–10.3)
lymph#: 1.3 10*3/uL (ref 0.9–3.3)
nRBC: 0 % (ref 0–0)

## 2008-10-10 LAB — MORPHOLOGY: PLT EST: DECREASED

## 2008-10-24 LAB — CBC WITH DIFFERENTIAL/PLATELET
BASO%: 0.4 % (ref 0.0–2.0)
EOS%: 1.9 % (ref 0.0–7.0)
HCT: 36.6 % (ref 34.8–46.6)
LYMPH%: 21.3 % (ref 14.0–49.7)
MCH: 32 pg (ref 25.1–34.0)
MCHC: 33.3 g/dL (ref 31.5–36.0)
NEUT%: 67.8 % (ref 38.4–76.8)
Platelets: 76 10*3/uL — ABNORMAL LOW (ref 145–400)
RBC: 3.81 10*6/uL (ref 3.70–5.45)
lymph#: 1 10*3/uL (ref 0.9–3.3)
nRBC: 0 % (ref 0–0)

## 2008-10-24 LAB — MORPHOLOGY: PLT EST: DECREASED

## 2008-10-27 ENCOUNTER — Ambulatory Visit: Payer: Self-pay | Admitting: Oncology

## 2008-10-31 LAB — BASIC METABOLIC PANEL
BUN: 16 mg/dL (ref 6–23)
CO2: 24 mEq/L (ref 19–32)
Calcium: 8.8 mg/dL (ref 8.4–10.5)
Chloride: 106 mEq/L (ref 96–112)
Creatinine, Ser: 0.75 mg/dL (ref 0.40–1.20)
Glucose, Bld: 99 mg/dL (ref 70–99)

## 2008-11-07 LAB — CBC WITH DIFFERENTIAL/PLATELET
BASO%: 0.6 % (ref 0.0–2.0)
Basophils Absolute: 0 10*3/uL (ref 0.0–0.1)
EOS%: 3.4 % (ref 0.0–7.0)
MCH: 33.5 pg (ref 25.1–34.0)
MCHC: 34.5 g/dL (ref 31.5–36.0)
MCV: 97.1 fL (ref 79.5–101.0)
MONO%: 8.3 % (ref 0.0–14.0)
RBC: 3.89 10*6/uL (ref 3.70–5.45)
RDW: 13.1 % (ref 11.2–14.5)
lymph#: 0.9 10*3/uL (ref 0.9–3.3)

## 2008-11-08 LAB — COMPREHENSIVE METABOLIC PANEL
ALT: 11 U/L (ref 0–35)
AST: 18 U/L (ref 0–37)
Albumin: 4.6 g/dL (ref 3.5–5.2)
Alkaline Phosphatase: 50 U/L (ref 39–117)
BUN: 16 mg/dL (ref 6–23)
Calcium: 9.5 mg/dL (ref 8.4–10.5)
Chloride: 104 mEq/L (ref 96–112)
Potassium: 3.9 mEq/L (ref 3.5–5.3)

## 2008-11-21 LAB — CBC WITH DIFFERENTIAL/PLATELET
Basophils Absolute: 0 10*3/uL (ref 0.0–0.1)
EOS%: 2.2 % (ref 0.0–7.0)
HCT: 37.5 % (ref 34.8–46.6)
HGB: 12.5 g/dL (ref 11.6–15.9)
LYMPH%: 24.7 % (ref 14.0–49.7)
MCH: 32.2 pg (ref 25.1–34.0)
MCHC: 33.3 g/dL (ref 31.5–36.0)
MCV: 96.6 fL (ref 79.5–101.0)
NEUT%: 64.1 % (ref 38.4–76.8)
Platelets: 82 10*3/uL — ABNORMAL LOW (ref 145–400)
lymph#: 1 10*3/uL (ref 0.9–3.3)

## 2008-11-21 LAB — MORPHOLOGY

## 2008-11-30 ENCOUNTER — Ambulatory Visit: Payer: Self-pay | Admitting: Oncology

## 2008-12-05 LAB — CBC WITH DIFFERENTIAL/PLATELET
Basophils Absolute: 0 10*3/uL (ref 0.0–0.1)
EOS%: 4.2 % (ref 0.0–7.0)
Eosinophils Absolute: 0.1 10*3/uL (ref 0.0–0.5)
HGB: 12.7 g/dL (ref 11.6–15.9)
LYMPH%: 34.1 % (ref 14.0–49.7)
MCH: 32.2 pg (ref 25.1–34.0)
MCV: 96.5 fL (ref 79.5–101.0)
MONO%: 7.4 % (ref 0.0–14.0)
NEUT#: 1.8 10*3/uL (ref 1.5–6.5)
NEUT%: 53.4 % (ref 38.4–76.8)
Platelets: 99 10*3/uL — ABNORMAL LOW (ref 145–400)
RDW: 12.5 % (ref 11.2–14.5)

## 2008-12-05 LAB — MORPHOLOGY: PLT EST: DECREASED

## 2008-12-15 LAB — UIFE/LIGHT CHAINS/TP QN, 24-HR UR
Albumin, U: DETECTED
Beta, Urine: DETECTED — AB
Free Kappa/Lambda Ratio: 17.8 ratio — ABNORMAL HIGH (ref 0.46–4.00)
Free Lambda Excretion/Day: 0.53 mg/d
Free Lambda Lt Chains,Ur: 0.05 mg/dL (ref 0.08–1.01)
Free Lt Chn Excr Rate: 9.35 mg/d
Total Protein, Urine-Ur/day: 29 mg/d (ref 10–140)
Total Protein, Urine: 2.8 mg/dL
Volume, Urine: 1050 mL

## 2008-12-19 LAB — CBC WITH DIFFERENTIAL/PLATELET
BASO%: 0.7 % (ref 0.0–2.0)
EOS%: 4.3 % (ref 0.0–7.0)
MCH: 33.9 pg (ref 25.1–34.0)
MCHC: 34.6 g/dL (ref 31.5–36.0)
MONO#: 0.2 10*3/uL (ref 0.1–0.9)
NEUT%: 55.6 % (ref 38.4–76.8)
RBC: 3.48 10*6/uL — ABNORMAL LOW (ref 3.70–5.45)
RDW: 13.2 % (ref 11.2–14.5)
WBC: 3 10*3/uL — ABNORMAL LOW (ref 3.9–10.3)
lymph#: 0.9 10*3/uL (ref 0.9–3.3)

## 2008-12-19 LAB — MORPHOLOGY: PLT EST: DECREASED

## 2008-12-26 ENCOUNTER — Ambulatory Visit (HOSPITAL_COMMUNITY): Admission: RE | Admit: 2008-12-26 | Discharge: 2008-12-26 | Payer: Self-pay | Admitting: Oncology

## 2008-12-29 ENCOUNTER — Ambulatory Visit: Payer: Self-pay | Admitting: Oncology

## 2009-01-02 LAB — CBC WITH DIFFERENTIAL/PLATELET
BASO%: 0.5 % (ref 0.0–2.0)
Basophils Absolute: 0 10*3/uL (ref 0.0–0.1)
EOS%: 3.9 % (ref 0.0–7.0)
HGB: 12.5 g/dL (ref 11.6–15.9)
MCH: 33.1 pg (ref 25.1–34.0)
MCHC: 33.4 g/dL (ref 31.5–36.0)
MCV: 98.9 fL (ref 79.5–101.0)
MONO%: 9 % (ref 0.0–14.0)
NEUT%: 58 % (ref 38.4–76.8)
RDW: 12.7 % (ref 11.2–14.5)
lymph#: 1.2 10*3/uL (ref 0.9–3.3)

## 2009-01-02 LAB — MORPHOLOGY

## 2009-01-04 LAB — COMPREHENSIVE METABOLIC PANEL
Albumin: 4.5 g/dL (ref 3.5–5.2)
BUN: 15 mg/dL (ref 6–23)
Calcium: 8.9 mg/dL (ref 8.4–10.5)
Chloride: 107 mEq/L (ref 96–112)
Creatinine, Ser: 0.71 mg/dL (ref 0.40–1.20)
Glucose, Bld: 96 mg/dL (ref 70–99)
Potassium: 4.1 mEq/L (ref 3.5–5.3)

## 2009-01-04 LAB — IMMUNOFIXATION ELECTROPHORESIS
IgA: 28 mg/dL — ABNORMAL LOW (ref 68–378)
IgG (Immunoglobin G), Serum: 387 mg/dL — ABNORMAL LOW (ref 694–1618)
IgM, Serum: 18 mg/dL — ABNORMAL LOW (ref 60–263)
Total Protein, Serum Electrophoresis: 6 g/dL (ref 6.0–8.3)

## 2009-01-04 LAB — KAPPA/LAMBDA LIGHT CHAINS

## 2009-01-04 LAB — BETA 2 MICROGLOBULIN, SERUM: Beta-2 Microglobulin: 1.31 mg/L (ref 1.01–1.73)

## 2009-01-25 ENCOUNTER — Ambulatory Visit: Payer: Self-pay | Admitting: Oncology

## 2009-01-30 LAB — CBC WITH DIFFERENTIAL/PLATELET
Basophils Absolute: 0 10*3/uL (ref 0.0–0.1)
Eosinophils Absolute: 0.1 10*3/uL (ref 0.0–0.5)
HGB: 12.4 g/dL (ref 11.6–15.9)
NEUT#: 2.2 10*3/uL (ref 1.5–6.5)
RDW: 13.1 % (ref 11.2–14.5)
WBC: 3.7 10*3/uL — ABNORMAL LOW (ref 3.9–10.3)
lymph#: 1.2 10*3/uL (ref 0.9–3.3)

## 2009-01-30 LAB — MORPHOLOGY: PLT EST: DECREASED

## 2009-02-13 LAB — CBC WITH DIFFERENTIAL/PLATELET
Basophils Absolute: 0 10*3/uL (ref 0.0–0.1)
Eosinophils Absolute: 0.1 10*3/uL (ref 0.0–0.5)
HCT: 36 % (ref 34.8–46.6)
HGB: 12 g/dL (ref 11.6–15.9)
LYMPH%: 25.3 % (ref 14.0–49.7)
MONO#: 0.4 10*3/uL (ref 0.1–0.9)
NEUT#: 2.2 10*3/uL (ref 1.5–6.5)
Platelets: 99 10*3/uL — ABNORMAL LOW (ref 145–400)
RBC: 3.66 10*6/uL — ABNORMAL LOW (ref 3.70–5.45)
WBC: 3.6 10*3/uL — ABNORMAL LOW (ref 3.9–10.3)
nRBC: 0 % (ref 0–0)

## 2009-02-13 LAB — MORPHOLOGY: PLT EST: DECREASED

## 2009-02-15 LAB — COMPREHENSIVE METABOLIC PANEL
BUN: 12 mg/dL (ref 6–23)
CO2: 22 mEq/L (ref 19–32)
Glucose, Bld: 102 mg/dL — ABNORMAL HIGH (ref 70–99)
Sodium: 142 mEq/L (ref 135–145)
Total Bilirubin: 0.5 mg/dL (ref 0.3–1.2)
Total Protein: 6 g/dL (ref 6.0–8.3)

## 2009-02-15 LAB — IMMUNOFIXATION ELECTROPHORESIS
IgM, Serum: 21 mg/dL — ABNORMAL LOW (ref 60–263)
Total Protein, Serum Electrophoresis: 6 g/dL (ref 6.0–8.3)

## 2009-02-15 LAB — LACTATE DEHYDROGENASE: LDH: 212 U/L (ref 94–250)

## 2009-02-15 LAB — KAPPA/LAMBDA LIGHT CHAINS: Lambda Free Lght Chn: 0.56 mg/dL — ABNORMAL LOW (ref 0.57–2.63)

## 2009-02-27 ENCOUNTER — Ambulatory Visit: Payer: Self-pay | Admitting: Oncology

## 2009-03-01 LAB — COMPREHENSIVE METABOLIC PANEL
BUN: 16 mg/dL (ref 6–23)
CO2: 28 mEq/L (ref 19–32)
Calcium: 9.3 mg/dL (ref 8.4–10.5)
Chloride: 105 mEq/L (ref 96–112)
Creatinine, Ser: 0.83 mg/dL (ref 0.40–1.20)
Potassium: 4.4 mEq/L (ref 3.5–5.3)
Sodium: 142 mEq/L (ref 135–145)

## 2009-03-01 LAB — MORPHOLOGY
PLT EST: DECREASED
RBC Comments: NORMAL

## 2009-03-01 LAB — CBC WITH DIFFERENTIAL/PLATELET
Basophils Absolute: 0 10*3/uL (ref 0.0–0.1)
Eosinophils Absolute: 0.1 10*3/uL (ref 0.0–0.5)
HGB: 12.6 g/dL (ref 11.6–15.9)
NEUT#: 1.7 10*3/uL (ref 1.5–6.5)
RDW: 13 % (ref 11.2–14.5)
lymph#: 1.3 10*3/uL (ref 0.9–3.3)

## 2009-03-01 LAB — LACTATE DEHYDROGENASE: LDH: 182 U/L (ref 94–250)

## 2009-03-30 ENCOUNTER — Ambulatory Visit: Payer: Self-pay | Admitting: Oncology

## 2009-04-03 LAB — CBC WITH DIFFERENTIAL/PLATELET
Basophils Absolute: 0 10*3/uL (ref 0.0–0.1)
EOS%: 3 % (ref 0.0–7.0)
Eosinophils Absolute: 0.1 10*3/uL (ref 0.0–0.5)
HCT: 38.4 % (ref 34.8–46.6)
HGB: 12.8 g/dL (ref 11.6–15.9)
LYMPH%: 28.5 % (ref 14.0–49.7)
MCH: 32.4 pg (ref 25.1–34.0)
MCV: 97.2 fL (ref 79.5–101.0)
MONO%: 7.6 % (ref 0.0–14.0)
NEUT#: 2.6 10*3/uL (ref 1.5–6.5)
NEUT%: 60 % (ref 38.4–76.8)
Platelets: 89 10*3/uL — ABNORMAL LOW (ref 145–400)
RDW: 12.3 % (ref 11.2–14.5)

## 2009-04-05 LAB — KAPPA/LAMBDA LIGHT CHAINS: Lambda Free Lght Chn: <0.47 mg/dL — ABNORMAL LOW (ref 0.57–2.63)

## 2009-04-05 LAB — COMPREHENSIVE METABOLIC PANEL
Albumin: 4.6 g/dL (ref 3.5–5.2)
Alkaline Phosphatase: 42 U/L (ref 39–117)
BUN: 22 mg/dL (ref 6–23)
CO2: 24 mEq/L (ref 19–32)
Calcium: 9.1 mg/dL (ref 8.4–10.5)
Chloride: 104 mEq/L (ref 96–112)
Glucose, Bld: 93 mg/dL (ref 70–99)
Potassium: 4 mEq/L (ref 3.5–5.3)
Sodium: 140 mEq/L (ref 135–145)
Total Protein: 6.2 g/dL (ref 6.0–8.3)

## 2009-04-05 LAB — IMMUNOFIXATION ELECTROPHORESIS
IgG (Immunoglobin G), Serum: 493 mg/dL — ABNORMAL LOW (ref 694–1618)
Total Protein, Serum Electrophoresis: 6.4 g/dL (ref 6.0–8.3)

## 2009-04-26 ENCOUNTER — Ambulatory Visit: Payer: Self-pay | Admitting: Oncology

## 2009-05-01 LAB — CBC WITH DIFFERENTIAL/PLATELET
BASO%: 0.2 % (ref 0.0–2.0)
EOS%: 1.5 % (ref 0.0–7.0)
HCT: 36.5 % (ref 34.8–46.6)
LYMPH%: 31.3 % (ref 14.0–49.7)
MCH: 32.4 pg (ref 25.1–34.0)
MCHC: 33.2 g/dL (ref 31.5–36.0)
MCV: 97.9 fL (ref 79.5–101.0)
MONO#: 0.3 10*3/uL (ref 0.1–0.9)
MONO%: 6.9 % (ref 0.0–14.0)
NEUT%: 60.1 % (ref 38.4–76.8)
Platelets: 101 10*3/uL — ABNORMAL LOW (ref 145–400)
RBC: 3.73 10*6/uL (ref 3.70–5.45)
WBC: 4 10*3/uL (ref 3.9–10.3)

## 2009-05-29 ENCOUNTER — Ambulatory Visit: Payer: Self-pay | Admitting: Oncology

## 2009-05-29 LAB — CBC WITH DIFFERENTIAL/PLATELET
Basophils Absolute: 0 10*3/uL (ref 0.0–0.1)
Eosinophils Absolute: 0.1 10*3/uL (ref 0.0–0.5)
HCT: 35.7 % (ref 34.8–46.6)
LYMPH%: 32.7 % (ref 14.0–49.7)
MCV: 99.1 fL (ref 79.5–101.0)
MONO#: 0.3 10*3/uL (ref 0.1–0.9)
MONO%: 8.9 % (ref 0.0–14.0)
NEUT#: 1.8 10*3/uL (ref 1.5–6.5)
NEUT%: 54.1 % (ref 38.4–76.8)
Platelets: 111 10*3/uL — ABNORMAL LOW (ref 145–400)
RBC: 3.6 10*6/uL — ABNORMAL LOW (ref 3.70–5.45)
WBC: 3.4 10*3/uL — ABNORMAL LOW (ref 3.9–10.3)

## 2009-06-26 LAB — CBC WITH DIFFERENTIAL/PLATELET
Basophils Absolute: 0 10*3/uL (ref 0.0–0.1)
EOS%: 3.5 % (ref 0.0–7.0)
Eosinophils Absolute: 0.1 10*3/uL (ref 0.0–0.5)
HCT: 38.9 % (ref 34.8–46.6)
HGB: 13 g/dL (ref 11.6–15.9)
MCH: 33.1 pg (ref 25.1–34.0)
MCV: 99 fL (ref 79.5–101.0)
NEUT#: 2.2 10*3/uL (ref 1.5–6.5)
NEUT%: 54.4 % (ref 38.4–76.8)
RDW: 12.5 % (ref 11.2–14.5)
lymph#: 1.3 10*3/uL (ref 0.9–3.3)

## 2009-06-27 LAB — COMPREHENSIVE METABOLIC PANEL
Albumin: 4.5 g/dL (ref 3.5–5.2)
BUN: 14 mg/dL (ref 6–23)
CO2: 24 mEq/L (ref 19–32)
Calcium: 8.7 mg/dL (ref 8.4–10.5)
Chloride: 105 mEq/L (ref 96–112)
Creatinine, Ser: 0.77 mg/dL (ref 0.40–1.20)
Potassium: 3.8 mEq/L (ref 3.5–5.3)

## 2009-06-27 LAB — IGG, IGA, IGM
IgA: 52 mg/dL — ABNORMAL LOW (ref 68–378)
IgM, Serum: 26 mg/dL — ABNORMAL LOW (ref 60–263)

## 2009-06-27 LAB — KAPPA/LAMBDA LIGHT CHAINS

## 2009-06-27 LAB — LACTATE DEHYDROGENASE: LDH: 175 U/L (ref 94–250)

## 2009-06-28 ENCOUNTER — Ambulatory Visit: Payer: Self-pay | Admitting: Oncology

## 2009-07-03 ENCOUNTER — Ambulatory Visit (HOSPITAL_COMMUNITY): Admission: RE | Admit: 2009-07-03 | Discharge: 2009-07-03 | Payer: Self-pay | Admitting: Oncology

## 2009-07-19 ENCOUNTER — Ambulatory Visit (HOSPITAL_COMMUNITY): Admission: RE | Admit: 2009-07-19 | Discharge: 2009-07-19 | Payer: Self-pay | Admitting: Oncology

## 2009-07-19 LAB — UIFE/LIGHT CHAINS/TP QN, 24-HR UR
Alpha 1, Urine: DETECTED — AB
Alpha 2, Urine: DETECTED — AB
Beta, Urine: DETECTED — AB
Free Lt Chn Excr Rate: 6.37 mg/d
Total Protein, Urine-Ur/day: 21 mg/d (ref 10–140)

## 2009-07-19 LAB — CREATININE CLEARANCE, URINE, 24 HOUR
Collection Interval-CRCL: 24 hours
Creatinine Clearance: 90 mL/min (ref 75–115)
Urine Total Volume-CRCL: 1225 mL

## 2009-07-24 LAB — CBC WITH DIFFERENTIAL/PLATELET
Basophils Absolute: 0 10*3/uL (ref 0.0–0.1)
Eosinophils Absolute: 0.1 10*3/uL (ref 0.0–0.5)
HCT: 36.9 % (ref 34.8–46.6)
HGB: 12.5 g/dL (ref 11.6–15.9)
MCH: 32.9 pg (ref 25.1–34.0)
MONO#: 0.3 10*3/uL (ref 0.1–0.9)
NEUT#: 1.9 10*3/uL (ref 1.5–6.5)
NEUT%: 54.6 % (ref 38.4–76.8)
WBC: 3.5 10*3/uL — ABNORMAL LOW (ref 3.9–10.3)
lymph#: 1.2 10*3/uL (ref 0.9–3.3)

## 2009-08-10 ENCOUNTER — Ambulatory Visit: Payer: Self-pay | Admitting: Oncology

## 2009-08-29 LAB — CBC WITH DIFFERENTIAL/PLATELET
Eosinophils Absolute: 0.1 10*3/uL (ref 0.0–0.5)
MONO#: 0.3 10*3/uL (ref 0.1–0.9)
NEUT#: 1.9 10*3/uL (ref 1.5–6.5)
RBC: 3.82 10*6/uL (ref 3.70–5.45)
RDW: 12.4 % (ref 11.2–14.5)
WBC: 3.5 10*3/uL — ABNORMAL LOW (ref 3.9–10.3)
lymph#: 1.2 10*3/uL (ref 0.9–3.3)

## 2009-08-29 LAB — MORPHOLOGY

## 2009-08-30 LAB — COMPREHENSIVE METABOLIC PANEL
AST: 20 U/L (ref 0–37)
Albumin: 4.8 g/dL (ref 3.5–5.2)
Alkaline Phosphatase: 55 U/L (ref 39–117)
BUN: 15 mg/dL (ref 6–23)
Potassium: 4.1 mEq/L (ref 3.5–5.3)
Sodium: 140 mEq/L (ref 135–145)
Total Protein: 7.2 g/dL (ref 6.0–8.3)

## 2009-08-30 LAB — KAPPA/LAMBDA LIGHT CHAINS: Lambda Free Lght Chn: 0.76 mg/dL (ref 0.57–2.63)

## 2009-09-21 ENCOUNTER — Ambulatory Visit: Payer: Self-pay | Admitting: Oncology

## 2009-09-25 LAB — BASIC METABOLIC PANEL
BUN: 15 mg/dL (ref 6–23)
Creatinine, Ser: 0.87 mg/dL (ref 0.40–1.20)

## 2009-10-15 LAB — CBC WITH DIFFERENTIAL/PLATELET
EOS%: 3.2 % (ref 0.0–7.0)
Eosinophils Absolute: 0.1 10*3/uL (ref 0.0–0.5)
LYMPH%: 36.2 % (ref 14.0–49.7)
MCH: 33.4 pg (ref 25.1–34.0)
MCHC: 34.4 g/dL (ref 31.5–36.0)
MCV: 97 fL (ref 79.5–101.0)
MONO%: 7.7 % (ref 0.0–14.0)
NEUT#: 1.7 10*3/uL (ref 1.5–6.5)
Platelets: 100 10*3/uL — ABNORMAL LOW (ref 145–400)
RBC: 3.74 10*6/uL (ref 3.70–5.45)

## 2009-10-15 LAB — MORPHOLOGY: RBC Comments: NORMAL

## 2009-10-16 LAB — COMPREHENSIVE METABOLIC PANEL
Albumin: 4.8 g/dL (ref 3.5–5.2)
Alkaline Phosphatase: 48 U/L (ref 39–117)
BUN: 13 mg/dL (ref 6–23)
Calcium: 9 mg/dL (ref 8.4–10.5)
Creatinine, Ser: 0.87 mg/dL (ref 0.40–1.20)
Glucose, Bld: 80 mg/dL (ref 70–99)
Potassium: 4 mEq/L (ref 3.5–5.3)

## 2009-10-16 LAB — KAPPA/LAMBDA LIGHT CHAINS
Kappa free light chain: 0.56 mg/dL (ref 0.33–1.94)
Lambda Free Lght Chn: 0.65 mg/dL (ref 0.57–2.63)

## 2009-11-02 ENCOUNTER — Ambulatory Visit: Payer: Self-pay | Admitting: Oncology

## 2009-12-21 ENCOUNTER — Ambulatory Visit: Payer: Self-pay | Admitting: Oncology

## 2009-12-25 LAB — CBC WITH DIFFERENTIAL/PLATELET
BASO%: 0.7 % (ref 0.0–2.0)
EOS%: 3.7 % (ref 0.0–7.0)
HCT: 40.5 % (ref 34.8–46.6)
HGB: 13.8 g/dL (ref 11.6–15.9)
MCH: 32.7 pg (ref 25.1–34.0)
MCHC: 34.1 g/dL (ref 31.5–36.0)
MONO#: 0.3 10*3/uL (ref 0.1–0.9)
RDW: 12.4 % (ref 11.2–14.5)
WBC: 4.3 10*3/uL (ref 3.9–10.3)
lymph#: 1.8 10*3/uL (ref 0.9–3.3)

## 2009-12-25 LAB — MORPHOLOGY: PLT EST: DECREASED

## 2009-12-27 LAB — COMPREHENSIVE METABOLIC PANEL
ALT: 10 U/L (ref 0–35)
AST: 20 U/L (ref 0–37)
Alkaline Phosphatase: 49 U/L (ref 39–117)
Creatinine, Ser: 0.87 mg/dL (ref 0.40–1.20)
Sodium: 140 mEq/L (ref 135–145)
Total Bilirubin: 0.5 mg/dL (ref 0.3–1.2)
Total Protein: 6.9 g/dL (ref 6.0–8.3)

## 2009-12-27 LAB — KAPPA/LAMBDA LIGHT CHAINS: Lambda Free Lght Chn: 0.7 mg/dL (ref 0.57–2.63)

## 2009-12-27 LAB — IMMUNOFIXATION ELECTROPHORESIS
IgA: 91 mg/dL (ref 68–378)
IgG (Immunoglobin G), Serum: 899 mg/dL (ref 694–1618)
IgM, Serum: 32 mg/dL — ABNORMAL LOW (ref 60–263)
Total Protein, Serum Electrophoresis: 6.9 g/dL (ref 6.0–8.3)

## 2010-02-13 ENCOUNTER — Ambulatory Visit: Payer: Self-pay | Admitting: Oncology

## 2010-02-15 LAB — CBC WITH DIFFERENTIAL/PLATELET
BASO%: 0.8 % (ref 0.0–2.0)
EOS%: 4.7 % (ref 0.0–7.0)
HCT: 37.6 % (ref 34.8–46.6)
LYMPH%: 40.1 % (ref 14.0–49.7)
MCH: 31.9 pg (ref 25.1–34.0)
MCHC: 33.2 g/dL (ref 31.5–36.0)
MONO#: 0.3 10*3/uL (ref 0.1–0.9)
NEUT%: 47.3 % (ref 38.4–76.8)
RBC: 3.92 10*6/uL (ref 3.70–5.45)
WBC: 3.8 10*3/uL — ABNORMAL LOW (ref 3.9–10.3)
lymph#: 1.5 10*3/uL (ref 0.9–3.3)

## 2010-02-15 LAB — MORPHOLOGY: PLT EST: DECREASED

## 2010-02-19 LAB — KAPPA/LAMBDA LIGHT CHAINS: Kappa free light chain: <0.66 mg/dL (ref 0.33–1.94)

## 2010-02-19 LAB — LACTATE DEHYDROGENASE: LDH: 164 U/L (ref 94–250)

## 2010-02-19 LAB — COMPREHENSIVE METABOLIC PANEL
Albumin: 4.4 g/dL (ref 3.5–5.2)
CO2: 26 mEq/L (ref 19–32)
Calcium: 8.9 mg/dL (ref 8.4–10.5)
Chloride: 104 mEq/L (ref 96–112)
Glucose, Bld: 86 mg/dL (ref 70–99)
Potassium: 4.2 mEq/L (ref 3.5–5.3)
Sodium: 141 mEq/L (ref 135–145)
Total Bilirubin: 0.4 mg/dL (ref 0.3–1.2)
Total Protein: 6.2 g/dL (ref 6.0–8.3)

## 2010-02-19 LAB — IMMUNOFIXATION ELECTROPHORESIS

## 2010-03-25 ENCOUNTER — Ambulatory Visit: Payer: Self-pay | Admitting: Oncology

## 2010-03-27 LAB — BASIC METABOLIC PANEL
BUN: 16 mg/dL (ref 6–23)
CO2: 26 mEq/L (ref 19–32)
Calcium: 8.9 mg/dL (ref 8.4–10.5)
Chloride: 105 mEq/L (ref 96–112)
Creatinine, Ser: 0.74 mg/dL (ref 0.40–1.20)
Glucose, Bld: 89 mg/dL (ref 70–99)
Potassium: 4.1 mEq/L (ref 3.5–5.3)
Sodium: 140 mEq/L (ref 135–145)

## 2010-03-29 ENCOUNTER — Other Ambulatory Visit: Payer: Self-pay | Admitting: Oncology

## 2010-03-29 DIAGNOSIS — C9000 Multiple myeloma not having achieved remission: Secondary | ICD-10-CM

## 2010-03-31 ENCOUNTER — Encounter: Payer: Self-pay | Admitting: Oncology

## 2010-03-31 ENCOUNTER — Encounter: Payer: Self-pay | Admitting: Family Medicine

## 2010-04-01 ENCOUNTER — Encounter: Payer: Self-pay | Admitting: Oncology

## 2010-05-15 ENCOUNTER — Encounter (HOSPITAL_BASED_OUTPATIENT_CLINIC_OR_DEPARTMENT_OTHER): Payer: 59 | Admitting: Oncology

## 2010-05-15 DIAGNOSIS — C9001 Multiple myeloma in remission: Secondary | ICD-10-CM

## 2010-05-15 DIAGNOSIS — Z452 Encounter for adjustment and management of vascular access device: Secondary | ICD-10-CM

## 2010-05-17 ENCOUNTER — Other Ambulatory Visit: Payer: Self-pay | Admitting: Oncology

## 2010-05-17 ENCOUNTER — Encounter (HOSPITAL_BASED_OUTPATIENT_CLINIC_OR_DEPARTMENT_OTHER): Payer: 59 | Admitting: Oncology

## 2010-05-17 ENCOUNTER — Ambulatory Visit (HOSPITAL_COMMUNITY)
Admission: RE | Admit: 2010-05-17 | Discharge: 2010-05-17 | Disposition: A | Discharge status: Home / Self Care | Payer: 59 | Source: Ambulatory Visit | Attending: Oncology | Admitting: Oncology

## 2010-05-17 DIAGNOSIS — S22009A Unspecified fracture of unspecified thoracic vertebra, initial encounter for closed fracture: Secondary | ICD-10-CM | POA: Insufficient documentation

## 2010-05-17 DIAGNOSIS — X58XXXA Exposure to other specified factors, initial encounter: Secondary | ICD-10-CM | POA: Insufficient documentation

## 2010-05-17 DIAGNOSIS — C9000 Multiple myeloma not having achieved remission: Secondary | ICD-10-CM | POA: Insufficient documentation

## 2010-05-17 DIAGNOSIS — M4000 Postural kyphosis, site unspecified: Secondary | ICD-10-CM

## 2010-05-17 DIAGNOSIS — C9001 Multiple myeloma in remission: Secondary | ICD-10-CM

## 2010-05-17 DIAGNOSIS — D696 Thrombocytopenia, unspecified: Secondary | ICD-10-CM

## 2010-05-17 DIAGNOSIS — M47812 Spondylosis without myelopathy or radiculopathy, cervical region: Secondary | ICD-10-CM | POA: Insufficient documentation

## 2010-05-17 LAB — CBC WITH DIFFERENTIAL/PLATELET
Basophils Absolute: 0 10*3/uL (ref 0.0–0.1)
HCT: 35.8 % (ref 34.8–46.6)
HGB: 12.3 g/dL (ref 11.6–15.9)
MONO#: 0.3 10*3/uL (ref 0.1–0.9)
NEUT#: 1.9 10*3/uL (ref 1.5–6.5)
NEUT%: 54.8 % (ref 38.4–76.8)
RDW: 12.7 % (ref 11.2–14.5)
WBC: 3.4 10*3/uL — ABNORMAL LOW (ref 3.9–10.3)
lymph#: 1.1 10*3/uL (ref 0.9–3.3)

## 2010-05-21 LAB — IMMUNOFIXATION ELECTROPHORESIS
IgA: 112 mg/dL (ref 68–378)
IgG (Immunoglobin G), Serum: 744 mg/dL (ref 694–1618)
IgM, Serum: 41 mg/dL — ABNORMAL LOW (ref 60–263)

## 2010-05-21 LAB — COMPREHENSIVE METABOLIC PANEL
Albumin: 4.6 g/dL (ref 3.5–5.2)
Alkaline Phosphatase: 46 U/L (ref 39–117)
Chloride: 103 mEq/L (ref 96–112)
Glucose, Bld: 83 mg/dL (ref 70–99)
Potassium: 3.9 mEq/L (ref 3.5–5.3)
Sodium: 141 mEq/L (ref 135–145)
Total Protein: 6.2 g/dL (ref 6.0–8.3)

## 2010-05-21 LAB — LACTATE DEHYDROGENASE: LDH: 182 U/L (ref 94–250)

## 2010-05-21 LAB — KAPPA/LAMBDA LIGHT CHAINS: Lambda Free Lght Chn: 1.07 mg/dL (ref 0.57–2.63)

## 2010-05-24 ENCOUNTER — Encounter (HOSPITAL_BASED_OUTPATIENT_CLINIC_OR_DEPARTMENT_OTHER): Payer: 59 | Admitting: Oncology

## 2010-05-24 DIAGNOSIS — D61818 Other pancytopenia: Secondary | ICD-10-CM

## 2010-05-24 DIAGNOSIS — C9001 Multiple myeloma in remission: Secondary | ICD-10-CM

## 2010-05-28 LAB — BONE MARROW EXAM

## 2010-05-28 LAB — DIFFERENTIAL
Basophils Absolute: 0 10*3/uL (ref 0.0–0.1)
Basophils Relative: 1 % (ref 0–1)
Eosinophils Absolute: 0.1 10*3/uL (ref 0.0–0.7)
Eosinophils Relative: 3 % (ref 0–5)
Monocytes Absolute: 0.4 10*3/uL (ref 0.1–1.0)
Monocytes Relative: 12 % (ref 3–12)
Neutro Abs: 1.8 10*3/uL (ref 1.7–7.7)

## 2010-05-28 LAB — TISSUE HYBRIDIZATION (BONE MARROW)-NCBH

## 2010-05-28 LAB — CHROMOSOME ANALYSIS, BONE MARROW

## 2010-05-28 LAB — CBC
Hemoglobin: 11.6 g/dL — ABNORMAL LOW (ref 12.0–15.0)
MCHC: 33.3 g/dL (ref 30.0–36.0)
MCV: 99.5 fL (ref 78.0–100.0)
RBC: 3.51 MIL/uL — ABNORMAL LOW (ref 3.87–5.11)
RDW: 12 % (ref 11.5–15.5)

## 2010-06-12 ENCOUNTER — Other Ambulatory Visit: Payer: Self-pay | Admitting: Oncology

## 2010-06-14 LAB — UIFE/LIGHT CHAINS/TP QN, 24-HR UR
Free Kappa Lt Chains,Ur: 0.79 mg/dL (ref 0.04–1.51)
Free Lambda Lt Chains,Ur: <0.04 mg/dL — ABNORMAL LOW (ref 0.08–1.01)
Free Lt Chn Excr Rate: 9.48 mg/d
Time: 24 hours
Volume, Urine: 1200 mL

## 2010-06-25 ENCOUNTER — Encounter (HOSPITAL_BASED_OUTPATIENT_CLINIC_OR_DEPARTMENT_OTHER): Payer: 59 | Admitting: Oncology

## 2010-06-25 ENCOUNTER — Other Ambulatory Visit: Payer: Self-pay | Admitting: Oncology

## 2010-06-25 DIAGNOSIS — C9001 Multiple myeloma in remission: Secondary | ICD-10-CM

## 2010-06-25 LAB — BASIC METABOLIC PANEL
BUN: 15 mg/dL (ref 6–23)
CO2: 25 mEq/L (ref 19–32)
Calcium: 9 mg/dL (ref 8.4–10.5)
Glucose, Bld: 86 mg/dL (ref 70–99)

## 2010-07-23 NOTE — Consult Note (Signed)
NAME:  Jessica Cochran, Jessica Cochran NO.:  [id number]   MEDICAL RECORD NO.:  [id number]          PATIENT TYPE:  INP   LOCATION:  3029                         FACILITY:  [facility]   PHYSICIAN:  Petra Kuba, M.D.    DATE OF BIRTH:  27-Aug-1960   DATE OF CONSULTATION:  DATE OF DISCHARGE:                                 CONSULTATION   HISTORY:  The patient is here at request of the [facility] for some nausea, vomiting, midepigastric discomfort and these  all resolved in the last 2 days.  She believes Reglan has helped her  although that was stopped by Dr. Cyndie Chime.   PAST MEDICAL HISTORY:  From a GI standpoint is essentially negative.  She has been sick for about a month, however.  She has lost about 15  pounds since she hurt her back and she has been constipated based on  inactivity and pain medicines, but Dulcolax does help.  She has never  had any previous GI tests.  Dr. Cyndie Chime was consulted for  hyperparathyroidism and pancytopenia, which was present on admission.  CAT scan was done, which showed little fluid around the gallbladder, and  a uterine probable polyp but no other obvious abnormalities.  She was  guaiac negative.  Not iron deficient based on iron studies and we were  consulted for further workup and plans.  Her past medical history is  essentially negative.  She did have pilonidal cyst.   FAMILY HISTORY:  Pertinent for some older family members with  gallbladder problems, but no other GI issues in the family.   SOCIAL HISTORY:  She does not drink or smoke.   ALLERGIES:  None.   MEDICATIONS:  Medicines at home none.  Currently in the hospital, she is  on morphine, potassium, Tylenol, Protonix, Reglan, magnesium, Cipro,  Compazine, Zofran, Benadryl, and Xanax.   REVIEW OF SYSTEMS:  Pertinent for her feeling better.   ASSESSMENT:  1. Hypercalcemia, questionable etiology.  2. Resolved nausea and vomiting, probably related to her increased     calcium levels with probable gastroparesis from that.  3. Resolved midepigastric discomfort.  4. Uterine abnormality and gallbladder abnormality on computed      tomography.  5. Anemia, not iron deficient, guaiac-negative, normal ferritin, low-      iron in percent sat.   PLAN:  Consider GI workup if symptoms continue.  Can hold for now since  CAT scan was okay, probably we will start with endoscopy, particularly  if upper tract symptoms continue.  She does need her hypercalcemia  explained   RECOMMENDATIONS:  Reglan seemed to be helpful, stopped by Dr.  Cyndie Chime.  We will see how she does off it, although hypercalcemia  does cause gastroparesis, might need a hepatic scan to make sure  cholecystitis is not playing a role.  We will follow with you.   Thank you very much.  I have discussed above with the patient and her  mother.           ______________________________  Petra Kuba, M.D.     MEM/MEDQ  D:  07/19/2007  T:  07/20/2007  Job:  [id number]   cc:   S. Kyra Manges, M.D.  Stacie Acres Cliffton Asters, M.D.

## 2010-07-23 NOTE — Discharge Summary (Signed)
NAMELAURINDA, Jessica Cochran NO.:  [id number]   MEDICAL RECORD NO.:  [id number]          PATIENT TYPE:  INP   LOCATION:  3029                         FACILITY:  [facility]   PHYSICIAN:  Hilda Lias, M.D.   DATE OF BIRTH:  1961/02/12   DATE OF ADMISSION:  07/15/2007  DATE OF DISCHARGE:  07/23/2007                               DISCHARGE SUMMARY   ADMISSION DIAGNOSES:  1. Fracture of the L1 vertebral body.  2. Nausea.  3. Vomiting.  4. Weight loss.   FINAL DIAGNOSES:  1. Multiple myeloma.  2. Fracture of L1.   CLINICAL HISTORY:  The patient was seen in my office because of  fractured L1 on Jul 15, 2007.  The patient gave me a history of losing  weight, nausea and vomiting, being unable to eat.  Also, she was  complaining of pain in the sternum.  Because of the finding, I was  suspicious of either myeloma or something else.  I decided to bring her  to the hospital.  She had been seen by several physicians for almost  three weeks.   LABORATORY:  Although they are not available in the chart to be seen;,  nevertheless, the potassium was elevated up to 16.  She has a potassium  of 3.7, phosphorus of 1.5, and white cell of 6.4.   COURSE IN THE HOSPITAL:  The patient was admitted, bedrest was given.  Because of the finding, we called the hospitalist, [facility], who will  followed her up and later on we called Dr. [facility]die [facility] who proceeded with a biopsy of the bone marrow.  Yesterday, Dr. Kayleen Memos went ahead and did a kyphoplasty of L1.  Today,  she is doing much better and she is going to go home.   CONDITION ON DISCHARGE:  Improving.   MEDICATIONS:  Percocet as well as the medication given by her medical  doctor.   DIET:  Regular.   ACTIVITY:  Not to drive nor any lifting.   FOLLOWUP:  She will be calling us to see her as well as Dr. Cyndie Chime  and her private medical doctor.           ______________________________  Hilda Lias,  M.D.    EB/MEDQ  D:  07/23/2007  T:  07/24/2007  Job:  [id number]

## 2010-07-23 NOTE — Op Note (Signed)
NAMESHAKINAH, Jessica Cochran NO.:  [id number]   MEDICAL RECORD NO.:  [id number]          PATIENT TYPE:  INP   LOCATION:  3029                         FACILITY:  [facility]   PHYSICIAN:  Stefani Dama, M.D.  DATE OF BIRTH:  [birth year]   DATE OF PROCEDURE:  07/22/2007  DATE OF DISCHARGE:                               OPERATIVE REPORT   PREOPERATIVE DIAGNOSIS:  L1 pathologic compression fracture secondary to  myeloma.   POSTOPERATIVE DIAGNOSIS:  L1 pathologic compression fracture secondary  to myeloma.   PROCEDURE:  1. L1 acrylic balloon kyphoplasty.  2. L1 vertebral body biopsy.   SURGEON:  Stefani Dama, MD   ANESTHESIA:  General endotracheal.   INDICATIONS:  Jessica Cochran is a 51 year old lady who sustained significant  back pain over the past number of weeks.  She was found to have a  pathologic compression fracture of the L1 vertebra.  Her workup noted  also that she had an extremely high calcium of 16 and further workup  revealed the fact that she had a light chain myeloma.  She is suspected  of having multiple myeloma in the L1 vertebra and kyphoplasty is now  being performed with further diagnostic biopsy of the bone.   PROCEDURE IN DETAIL:  The patient was brought to the operating room  supine on the stretcher.  After she was put under general endotracheal  anesthesia, she was turned prone.  The bony prominences were  appropriately padded and protected very carefully.  The back was then  cleansed with alcohol and DuraPrep, draped in sterile fashion.  Fluoroscopic guidance was used to localize the L1 vertebra.  Then  through the left far lateral approach, the lateral aspect of the pedicle  was entered with a bone biopsy needle, the Jamshidi type.  This trocar  was advanced towards the medial aspect of the pedicle and into the  vertebral body.  Then a bone biopsy needle was inserted through this and  a core of bone was obtained from the L1 vertebral body.  There  was  significant gelatinous component of the marrow that was aspirated also  and this was sent as a specimen also.  Once the core of bone was  removed, a balloon was inserted into this opening and the balloon was  inflated gradually to 120, then 140 mmHg.  This inflated the vertebral  body.  The superior endplate at the T12-L1 interspace was noted to have  broken through.  At this time, the kyphoplasty cement was mixed, placed  in tubes and allowed to partially harden.  When it reached the  appropriate consistency, filling of the vertebral defect was performed  after removing the balloon and inserting then a total of 3 of the  trocars full of glue for a total of 4.5 mL of glue filling the vertebral  body from left-to-right and front-to-back.  A singular lateral approach from the left side only needed be completed  to obtain adequate filling.  Once the filling was felt to be complete as  could be obtained on the fluoroscopic images through this approach, the  trocar needle was withdrawn.  A singular 3-0 stitch was placed into the  subcuticular skin.  The patient tolerated the procedure well.      Stefani Dama, M.D.  Electronically Signed     HJE/MEDQ  D:  07/22/2007  T:  07/23/2007  Job:  [id number]

## 2010-07-23 NOTE — Op Note (Signed)
NAME:  Jessica Cochran, Jessica Cochran NO.:  [id number]   MEDICAL RECORD NO.:  [id number]          PATIENT TYPE:  AMB   LOCATION:  OMED                         FACILITY:  [facility]   PHYSICIAN:  Genene Churn. Granfortuna, M.D.DATE OF BIRTH:  Aug 16, 1960   DATE OF PROCEDURE:  10/14/2007  DATE OF DISCHARGE:                               OPERATIVE REPORT   PROCEDURE:  Left posterior iliac crest bone marrow aspiration and biopsy  under 2% lidocaine local anesthesia.   PREMEDICATION:  Versed 4.5 mg IV.   AIRWAY EVALUATION:  Class I   INDICATIONS:  Evaluate response to treatment with Revlimid, Velcade, and  Decadron in a 50 year old woman with kappa light chain multiple myeloma.   COMPLICATIONS:  There were no complications.  Procedure technically  difficult due to patient body habitus.      Genene Churn. Cyndie Chime, M.D.  Electronically Signed     JMG/MEDQ  D:  10/14/2007  T:  10/14/2007  Job:  [id number]

## 2010-07-23 NOTE — Consult Note (Signed)
NAMEDAWNNA, GRITZ          ACCOUNT NO.:  [id number]   MEDICAL RECORD NO.:  [id number]          PATIENT TYPE:  INP   LOCATION:  5120                         FACILITY:  [facility]   PHYSICIAN:  Hollice Espy, M.D.DATE OF BIRTH:  August 28, 1960   DATE OF CONSULTATION:  07/15/2007  DATE OF DISCHARGE:                                 CONSULTATION   REASON FOR CONSULTATION:  Weakness and weight loss.   PRIMARY CARE PHYSICIAN:  Stacie Acres. Cliffton Asters, M.D.   ORTHOPEDIC SURGEON:  Lillia Carmel, M.D.   CHIEF COMPLAINT:  Lower back pain.   HISTORY OF PRESENT ILLNESS:  The patient is a 50 year old white female  with essentially no past medical history who for the past 4 months has  had a variety of symptoms that started initially with a mild cough that  ended up leading to an episode of pain, where it was assumed that the  patient possibly had a cracked rib or a sprained muscle from heavy  coughing.  The pain symptoms persisted but over the course of the past  few months it migrated into the front of the chest and other areas.  Initially she had a variety of other symptoms, worsening over the past  month, including nausea, weight loss, generalized weakness and then  severe back pain that has been getting worse.  The patient has seen her  physician, and at one point, had a chest x-ray done in February 2009  which was reportedly normal.  The patient has also seen an OB/GYN and  most of that workup was normal although reportedly a prolactin level was  elevated.  The patient was sent for an MRI, which she has had done in  the last day or 2.  In the interim, for this continuation of workup of  what has been going on, most of her lower back pain has gotten to the  point where she has had such severe pain she can barely move.  She ended  up seeing a chiropractor, who ordered x-rays.  Reportedly the x-rays  showed a compression fracture as well as signs of osteoporotic lesions.  There was a concern  why a patient at age 46 would have a compression  fracture and severe osteoporosis, and she was referred to an orthopedic  surgeon.  The patient underwent an MRI of her lumbar spine and after  discussion with Dr. Jeral Fruit, the patient was brought for further  evaluation.  When the patient arrived to the floor she had basic labs  done.  The labs came back notable for a potassium level of 3, a  creatinine of 1.99, and the most concerning thing was a calcium of 14.8.  With these findings, I had an extensive discussion with the patient and  she is likely suffering from severe hypercalcemia.  The cause is not  immediately clear but possibly hyperparathyroidism.  Currently the  patient is doing okay.  She continues to have severe back pain.  As long  as she does not move, it is not intolerable.  She complains of mild  nausea and no appetite, but no vomiting.  She denies  any headaches,  vision changes.  No dysphagia.  No chest pain or palpitations.  No  shortness of breath, wheeze, cough.  No abdominal pain.  No hematuria or  dysuria.  She has frequent episodes of urination.  She has constipation  but no diarrhea.  No focal numbness, weakness, or pain.  Overall she  feels generally weak.  Her review of systems is otherwise negative.   PAST MEDICAL HISTORY:  Essentially unremarkable.  She has a history of  pilonidal cyst removed in the past.   MEDICATIONS:  She has been on pain medication and p.r.n. Tylenol as of  late.  She also normally just takes a multivitamin and a calcium  supplement.   ALLERGIES:  SHE HAS NO KNOWN DRUG ALLERGIES.   SOCIAL HISTORY:  She denies any tobacco, alcohol, or drug use.   FAMILY HISTORY:  Noncontributory.   PHYSICAL EXAMINATION:  VITAL SIGNS:  On admission temperature 98.2,  heart rate 112, blood pressure 143/93, respirations 20, 02 sat 97% on  room air.  GENERAL:  She is alert and oriented x3.  In no apparent distress.  HEENT:  Normocephalic, atraumatic.  Her  mucous membranes are moist.  She  had no carotid bruits.  HEART:  Regular rate and rhythm.  S1 and S2.  LUNGS:  Clear to auscultation bilaterally.  ABDOMEN:  Soft, nontender, nondistended.  Positive bowel sounds.  EXTREMITIES:  No clubbing, cyanosis or edema.   LABORATORY DATA:  UA:  20-50 white cells and a few bacteria with  moderate leukocytes and small blood.  Her sodium was 134, potassium 3,  chloride 92, bicarb 29, BUN 29, creatinine 1.99, glucose 112.  She has a  serum calcium of 14.8.  Reportedly, this has been verified.  Her white  count is normal at 9.2, H&H 11.3 and 33.  MCV 87, platelet count 162.  Her MRI done of her lumbar spine on June 12, 2007 shows an acute  superior end plate compression fracture of L1 with approximately 90%  vertebral body height loss anteriorly and some bony retropulsion.  She  also was found to have an acute but moderate end plate compression  fracture of L2, far left protrusion at L4 and L5, appearing to compress  the exiting left 4 root, mild-to-moderate left foraminal narrowing of L3-  4 due to DJD, and facet arthropathy.   ASSESSMENT AND PLAN:  1. Hypercalcemia.  I think this would explain much of her symptoms      including the compression fracture, random pain, dehydration,      nausea and weight loss.  As far as the cause, it is likely      hyperparathyroidism although cancer cannot be overlooked.  Will      start by treatment with IV fluids as she is clearly dry, caused by      the calcium.  Await intact PTH and PO4 .  Have also ordered a      confirmatory ionized calcium and other additional testing.  If this      is indeed hyperparathyroidism short term she will need IV      medication to lower her calcium such as IV pamidronate and long-      term look into  surgical options for removal of the parathyroid      gland.  Will also, in the meantime, screen for cancer.  She has      already got a chest and abdominal CT ordered by  neurosurgery.  2. Compression  fracture.  She needs a kyphoplasty.  Will defer to Dr.      Jeral Fruit for who will perform this, whether it be neurosurgery or IR      or Ortho.  3. The patient has a urinary tract infection.  Will prophylactically      treat.  4. Hypokalemia.  Will replace.  5. Acute renal failure.  This is secondary to the hypercalcemia and      will aggressively hydrate.  6. Osteoporosis.  Assumed this is secondary to her hypercalcemia and      likely dependent on the cause but she will need followup.  She may      end up having to be on additional medication for short or long term      in terms of reversing the leached calcium from her bones.  7. Reportedly increased prolactin level.  The patient has had an MRI      done of the head.  This was done at a different facility then,      [facility], and her husband, however, has the films, so      we will review these films.      Hollice Espy, M.D.  Electronically Signed     SKK/MEDQ  D:  07/15/2007  T:  07/15/2007  Job:  [id number]   cc:   Hilda Lias, M.D.  Stacie Acres Cliffton Asters, M.D.  Lillia Carmel, M.D.

## 2010-07-23 NOTE — Consult Note (Signed)
NAME:  Jessica Cochran, Jessica Cochran NO.:  [id number]   MEDICAL RECORD NO.:  [id number]           PATIENT TYPE:   LOCATION:                                 FACILITY:   PHYSICIAN:  Genene Churn. Granfortuna, M.D.DATE OF BIRTH:  [birth year]   DATE OF CONSULTATION:  DATE OF DISCHARGE:                                 CONSULTATION   This is a hematology consultation to evaluate this lady for unexplained  anemia, mild thrombocytopenia, hypercalcemia, and multiple bone lesions.   Jessica Cochran is a 50 year old woman who has been in overall good health  and was on no chronic medications prior to January of this year.  She  developed a flu-like illness at that time.  She was treated with  antibiotics.  She had a persistent cough that lasted for 2 months.  She  was put on Mobic and Flexeril when she developed bilateral rib pain from  coughing so much.  In March, she began to develop low back pain.  She  was evaluated by her chiropractor and her primary care physician, and  ultimately she was referred to neurosurgery, Dr. Jeral Fruit.  MRI of the  spine done just prior to admission on Jul 12, 2007, which I have  personally reviewed with the radiologist, shows a number of lesions in  the lumbar spine most prominent at L1 where there is a 90% compression  fracture of the vertebral body.  There are additional changes at L2, L3,  L4, and L5.  These fractures appear subacute and do not enhance.  They  have characteristics of benign fractures.  There is no abnormal bone  marrow activity.   The patient was admitted to the hospital on Jul 15, 2007, for further  evaluation.  Baseline laboratory studies showed a number of  abnormalities.  Initial hemoglobin 11 and hematocrit 33 with fall in  hemoglobin to 8.4 following hydration.  Initial white count 9200 and  platelet count 162,000.  Repeat CBC done on Jul 18, 2007, with  hemoglobin 8.1, white count 3800, 85% neutrophil, 9 lymphocytes, 5  monocytes, 1  eosinophil, and platelet count of 76,000.  Initial  metabolic profile showed a BUN of 21, creatinine of 2, calcium of 14.8  with albumin of 4.1, and serum total protein of 6.9.  ESR 74 mm.  A TSH  of 2.8.  A parathyroid hormone intact level low at 3.9 (14-72) and serum  alkaline phosphatase normal at 94.   She was started on hydration and given calcitonin and Zometa to lower  the calcium.  Additional x-ray studies were done including an admission  chest radiograph which showed no infiltrate or effusion and no mass in  the lungs and no evidence for mediastinal adenopathy.  A CT scan of the  chest, abdomen, and pelvis done on Jul 19, 2007, which I personally  reviewed with the radiologist, shows some bibasilar linear atelectasis  and scarring, but no lung lesions or adenopathy.  In the abdomen, there  is no hepatosplenomegaly and no lymphadenopathy.  A number of bone  lesions are noted at multiple levels including T5, T10, L1, and L2.  A  fracture of the sternum noted.  In the pelvis, there is a soft tissue  mass within the endometrial cavity, polyp versus carcinoma.   At this point, her rib pain has almost completely resolved, but she is  having progressive back pain.  In the week prior to admission, she also  had anorexia and intermittent vomiting.  She has been constipated.  She  has had some nonspecific gastritis.   She has lost about 16 pounds over the last 3 weeks.   PAST MEDICAL HISTORY:  Unremarkable except as noted above.  Remote  surgery to remove a pilonidal cyst.  Recently told she had an elevated  prolactin level by her gynecologist.  MRI of the brain apparently did  not show any pituitary tumors.   She was on no medication except Tylenol at the time of admission.   No allergies.   FAMILY HISTORY:  Mother has diabetes.  Father died at age 50 of a heart  attack.  One sister and one brother who are healthy.   SOCIAL HISTORY:  She is married.  Husband accompanies her  today.  They  have no children.  She believes she was pregnant once, but the pregnancy  only lasted for a few weeks.  She does not smoke or use alcohol.  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  No  toxic or chemical exposures.   REVIEW OF SYSTEMS:  She has not had any night sweats or fevers.  No  headache or change in vision.  Cough has resolved.  No dyspnea.  No  chest pain or palpitations.  No paresthesias.  No edema.  No hematuria.  No change in bowel habit except for increasing constipation likely  related to hypercalcemia.  No hematochezia or melena.  Her periods were  irregular and scant over the last year with minimal bleeding and periods  approximately every 2 months.   PHYSICAL EXAM:  GENERAL:  A well-nourished Caucasian woman.  VITAL SIGNS:  Pulse 107 and regular, blood pressure 116/77, respirations  18, temperature 97.9, and weight not recorded.  SKIN:  Pale.  EYES:  Pupils equal and reactive to light.  PHARYNX:  No erythema or exudate.  LUNGS:  Clear.  Resonant to percussion.  CHEST:  Regular cardiac rhythm.  No murmur.  No lymphadenopathy.  No  breast masses.  ABDOMEN:  Soft and nontender.  No masses.  No organomegaly.  There is  point tenderness over the lumbar spine on palpation.  EXTREMITIES:  No edema.  No calf tenderness.  NEUROLOGIC:  Motor strength is 5/5.  Reflexes are 1+ and symmetric.   ADDITIONAL LAB:  A prolactin level is 173 units with postmenopausal  range 1.8-20.3.   Preliminary results on a serum protein electrophoresis shows a  possibility of a pain restrictive band cannot be completely excluded in  the gamma region.  This being said, the total serum gamma globulin is  5.5 which is lower than normal (11-18.8).   Review of the peripheral blood film shows normochromic, normocytic red  cells, benign-appearing neutrophils and lymphocytes, and normal-  appearing platelets with slight decrease in number.   IMPRESSION:  1.  Pancytopenia, hypercalcemia, anemia, and bone lesions.  Although      myeloma would certainly be a consideration at this point, this      seems unlikely given a normal serum total protein, low gamma      globulin fraction, serum protein electrophoresis, and lesions on  the MRI of these bone which appear to be benign osteoporotic      compression fractures with no enhancement and no abnormal marrow      signal.  She had a near normal complete blood count on admission      with development of pancytopenia within 48 hours.  White count has      already recovered and platelets are improving suggesting      possibility of a medication effect to explain the leukopenia and      thrombocytopenia.  She has a number of endocrine abnormalities, including a abnormally low  parathyroid hormone in the base of bone disease, hypercalcemia, and  elevated prolactin level.  At this point, I cannot arrive at a unifying  diagnosis.  1. Large endometrial lesion.  Currently not having any significant      vaginal bleeding.  In fact, she is almost amenorrheic.  She is      being followed closely by her gynecologist, Dr. Elana Alm.  I think      that even if she has an endometrial cancer, this would not      typically go to bone and I do not believe it is related to her      anemia, hypercalcemia, or bone lesions.   RECOMMENDATIONS:  1. At this point, I would stop any unnecessary medications that might      be having an effect on her bone marrow function including the Cipro      and Reglan.  2. Continue to monitor her CBC and chemistry profiles.  3. I would like to wait for the final results on her serum and urine      studies to rule out myeloma.  She could have a nonsecretory myeloma      or a light chain myeloma that would not be detected by the serum      studies done so far.  I am ordering a 24-hour urine collection for      total calcium, total protein, immuno fixation electrophoresis, and       creatinine clearance.   No immediate indication for bone marrow biopsy, but I will re-evaluate  when outstanding data is available.   Thank you for this consultation.      Genene Churn. Cyndie Chime, M.D.  Electronically Signed     JMG/MEDQ  D:  07/20/2007  T:  07/20/2007  Job:  [id number]   cc:   Michiel Cowboy, MD  Hilda Lias, M.D.  Stacie Acres Cliffton Asters, M.D.  Kyra Manges

## 2010-07-23 NOTE — Op Note (Signed)
NAMEGABRIELA, IRIGOYEN NO.:  [id number]   MEDICAL RECORD NO.:  [id number]          PATIENT TYPE:  INP   LOCATION:  3029                         FACILITY:  [facility]   PHYSICIAN:  Genene Churn. Granfortuna, M.D.DATE OF BIRTH:  [birth year]   DATE OF PROCEDURE:  07/21/2007  DATE OF DISCHARGE:                               OPERATIVE REPORT   TIME:  9 a.m.   PROCEDURE:  Bilateral posterior iliac crest bone marrow biopsies with an  unilateral right iliac crest bone marrow aspiration done with 2%  lidocaine local anesthesia.   PREMEDICATION:  1 mg IV Ativan and 5 mg IV morphine.   PREPROCEDURE CLASS:  Airway evaluation class I.   COMPLICATIONS:  None.   INDICATIONS:  Anemia, thrombocytopenia, bone lesions, and light-chain  proteinuria.  Rule out multiple myeloma.      Genene Churn. Cyndie Chime, M.D.  Electronically Signed     JMG/MEDQ  D:  07/21/2007  T:  07/22/2007  Job:  [id number]   cc:   Stacie Acres. Cliffton Asters, M.D.  Michiel Cowboy, MD  Hilda Lias, M.D.

## 2010-07-26 ENCOUNTER — Encounter (HOSPITAL_BASED_OUTPATIENT_CLINIC_OR_DEPARTMENT_OTHER): Payer: 59 | Admitting: Oncology

## 2010-07-26 DIAGNOSIS — D61818 Other pancytopenia: Secondary | ICD-10-CM

## 2010-07-26 DIAGNOSIS — C9001 Multiple myeloma in remission: Secondary | ICD-10-CM

## 2010-08-02 ENCOUNTER — Encounter (HOSPITAL_BASED_OUTPATIENT_CLINIC_OR_DEPARTMENT_OTHER): Payer: 59 | Admitting: Oncology

## 2010-08-02 DIAGNOSIS — B029 Zoster without complications: Secondary | ICD-10-CM

## 2010-08-02 DIAGNOSIS — C9001 Multiple myeloma in remission: Secondary | ICD-10-CM

## 2010-08-13 ENCOUNTER — Encounter (HOSPITAL_BASED_OUTPATIENT_CLINIC_OR_DEPARTMENT_OTHER): Payer: 59 | Admitting: Oncology

## 2010-08-13 DIAGNOSIS — C9001 Multiple myeloma in remission: Secondary | ICD-10-CM

## 2010-08-13 DIAGNOSIS — Z452 Encounter for adjustment and management of vascular access device: Secondary | ICD-10-CM

## 2010-08-23 ENCOUNTER — Other Ambulatory Visit: Payer: Self-pay | Admitting: Oncology

## 2010-08-23 ENCOUNTER — Encounter (HOSPITAL_BASED_OUTPATIENT_CLINIC_OR_DEPARTMENT_OTHER): Payer: 59 | Admitting: Oncology

## 2010-08-23 DIAGNOSIS — M4000 Postural kyphosis, site unspecified: Secondary | ICD-10-CM

## 2010-08-23 DIAGNOSIS — Z452 Encounter for adjustment and management of vascular access device: Secondary | ICD-10-CM

## 2010-08-23 DIAGNOSIS — C9001 Multiple myeloma in remission: Secondary | ICD-10-CM

## 2010-08-23 DIAGNOSIS — C9000 Multiple myeloma not having achieved remission: Secondary | ICD-10-CM

## 2010-08-23 LAB — CBC WITH DIFFERENTIAL/PLATELET
BASO%: 0.5 % (ref 0.0–2.0)
EOS%: 3.5 % (ref 0.0–7.0)
LYMPH%: 27 % (ref 14.0–49.7)
MCH: 32.1 pg (ref 25.1–34.0)
MCHC: 33.2 g/dL (ref 31.5–36.0)
MONO#: 0.3 10*3/uL (ref 0.1–0.9)
RBC: 3.77 10*6/uL (ref 3.70–5.45)
WBC: 4.2 10*3/uL (ref 3.9–10.3)
lymph#: 1.1 10*3/uL (ref 0.9–3.3)

## 2010-08-27 LAB — IMMUNOFIXATION ELECTROPHORESIS
IgA: 114 mg/dL (ref 68–380)
IgM, Serum: 33 mg/dL — ABNORMAL LOW (ref 52–322)

## 2010-08-27 LAB — KAPPA/LAMBDA LIGHT CHAINS
Kappa free light chain: 0.8 mg/dL (ref 0.33–1.94)
Kappa:Lambda Ratio: 0.94 (ref 0.26–1.65)
Lambda Free Lght Chn: 0.85 mg/dL (ref 0.57–2.63)

## 2010-08-27 LAB — COMPREHENSIVE METABOLIC PANEL
ALT: 12 U/L (ref 0–35)
AST: 27 U/L (ref 0–37)
CO2: 30 mEq/L (ref 19–32)
Chloride: 106 mEq/L (ref 96–112)
Creatinine, Ser: 0.84 mg/dL (ref 0.50–1.10)
Sodium: 141 mEq/L (ref 135–145)
Total Bilirubin: 0.2 mg/dL — ABNORMAL LOW (ref 0.3–1.2)
Total Protein: 6 g/dL (ref 6.0–8.3)

## 2010-08-27 LAB — LACTATE DEHYDROGENASE: LDH: 190 U/L (ref 94–250)

## 2010-08-30 ENCOUNTER — Encounter (HOSPITAL_BASED_OUTPATIENT_CLINIC_OR_DEPARTMENT_OTHER): Payer: 59 | Admitting: Oncology

## 2010-08-30 DIAGNOSIS — C9001 Multiple myeloma in remission: Secondary | ICD-10-CM

## 2010-08-30 DIAGNOSIS — B029 Zoster without complications: Secondary | ICD-10-CM

## 2010-10-10 ENCOUNTER — Other Ambulatory Visit: Payer: Self-pay | Admitting: Oncology

## 2010-10-10 ENCOUNTER — Ambulatory Visit (HOSPITAL_COMMUNITY): Payer: 59 | Attending: Oncology

## 2010-10-10 DIAGNOSIS — C9000 Multiple myeloma not having achieved remission: Secondary | ICD-10-CM | POA: Insufficient documentation

## 2010-10-10 LAB — DIFFERENTIAL
Basophils Relative: 1 % (ref 0–1)
Eosinophils Absolute: 0.1 10*3/uL (ref 0.0–0.7)
Eosinophils Relative: 3 % (ref 0–5)
Lymphocytes Relative: 39 % (ref 12–46)
Monocytes Relative: 11 % (ref 3–12)
Neutrophils Relative %: 46 % (ref 43–77)

## 2010-10-10 LAB — CBC
Hemoglobin: 12 g/dL (ref 12.0–15.0)
MCH: 33.4 pg (ref 26.0–34.0)
MCHC: 34.3 g/dL (ref 30.0–36.0)
Platelets: 90 10*3/uL — ABNORMAL LOW (ref 150–400)
RDW: 13.2 % (ref 11.5–15.5)

## 2010-10-15 NOTE — Op Note (Signed)
  NAMEMAIRA, CHRISTON NO.:  [id number]  MEDICAL RECORD NO.:  [id number]  LOCATION:  [facility]                          FACILITY:  [facility]  PHYSICIAN:  Levert Feinstein, M.D., F.A.C.P.DATE OF BIRTH: [birth year]  DATE OF PROCEDURE:  10/10/2010 DATE OF DISCHARGE:                              OPERATIVE REPORT   PROCEDURE NOTE:  Right posterior iliac crest bone marrow aspiration and biopsy done under 2% lidocaine anesthesia with no complication.  3 mg IV Versed premedication.  ASA status I, airway status I.  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     Levert Feinstein, M.D., F.A.C.P.     JMG/MEDQ  D:  10/10/2010  T:  10/11/2010  Job:  [id number]  Electronically Signed by Cephas Darby M.D. on 10/15/2010 02:03:55 PM

## 2010-11-22 ENCOUNTER — Other Ambulatory Visit: Payer: Self-pay | Admitting: Oncology

## 2010-11-22 ENCOUNTER — Encounter (HOSPITAL_BASED_OUTPATIENT_CLINIC_OR_DEPARTMENT_OTHER): Payer: 59 | Admitting: Oncology

## 2010-11-22 DIAGNOSIS — Z452 Encounter for adjustment and management of vascular access device: Secondary | ICD-10-CM

## 2010-11-22 DIAGNOSIS — C9001 Multiple myeloma in remission: Secondary | ICD-10-CM

## 2010-11-22 DIAGNOSIS — B029 Zoster without complications: Secondary | ICD-10-CM

## 2010-11-22 LAB — CBC WITH DIFFERENTIAL/PLATELET
Basophils Absolute: 0 10*3/uL (ref 0.0–0.1)
EOS%: 3.9 % (ref 0.0–7.0)
HGB: 13.1 g/dL (ref 11.6–15.9)
MCH: 34.5 pg — ABNORMAL HIGH (ref 25.1–34.0)
MCHC: 34.8 g/dL (ref 31.5–36.0)
MCV: 99.2 fL (ref 79.5–101.0)
MONO%: 8.8 % (ref 0.0–14.0)
RDW: 12.7 % (ref 11.2–14.5)

## 2010-11-26 LAB — KAPPA/LAMBDA LIGHT CHAINS: Kappa free light chain: 0.98 mg/dL (ref 0.33–1.94)

## 2010-11-26 LAB — IMMUNOFIXATION ELECTROPHORESIS
IgA: 109 mg/dL (ref 68–380)
IgG (Immunoglobin G), Serum: 554 mg/dL — ABNORMAL LOW (ref 690–1700)
IgM, Serum: 30 mg/dL — ABNORMAL LOW (ref 52–322)

## 2010-11-26 LAB — COMPREHENSIVE METABOLIC PANEL
AST: 21 U/L (ref 0–37)
Alkaline Phosphatase: 47 U/L (ref 39–117)
BUN: 15 mg/dL (ref 6–23)
Creatinine, Ser: 0.85 mg/dL (ref 0.50–1.10)

## 2010-11-29 ENCOUNTER — Encounter (HOSPITAL_BASED_OUTPATIENT_CLINIC_OR_DEPARTMENT_OTHER): Payer: 59 | Admitting: Oncology

## 2010-11-29 ENCOUNTER — Other Ambulatory Visit: Payer: Self-pay | Admitting: Oncology

## 2010-11-29 DIAGNOSIS — C9001 Multiple myeloma in remission: Secondary | ICD-10-CM

## 2010-12-03 LAB — UIFE/LIGHT CHAINS/TP QN, 24-HR UR
Beta, Urine: DETECTED — AB
Free Lambda Lt Chains,Ur: 0.03 mg/dL (ref 0.02–0.67)
Free Lt Chn Excr Rate: 11.47 mg/d
Volume, Urine: 1550 mL

## 2010-12-04 LAB — CBC
HCT: 28.4 — ABNORMAL LOW
MCV: 90
Platelets: 243
RDW: 18.8 — ABNORMAL HIGH

## 2010-12-06 LAB — DIFFERENTIAL
Basophils Relative: 3 — ABNORMAL HIGH
Eosinophils Absolute: 0
Eosinophils Relative: 1
Monocytes Relative: 14 — ABNORMAL HIGH
Neutrophils Relative %: 66

## 2010-12-06 LAB — CBC
HCT: 26 — ABNORMAL LOW
MCHC: 33.4
MCV: 95.9
Platelets: 227
RBC: 2.71 — ABNORMAL LOW

## 2010-12-06 LAB — CHROMOSOME ANALYSIS, BONE MARROW

## 2010-12-24 ENCOUNTER — Other Ambulatory Visit: Payer: Self-pay | Admitting: Oncology

## 2010-12-24 ENCOUNTER — Encounter (HOSPITAL_BASED_OUTPATIENT_CLINIC_OR_DEPARTMENT_OTHER): Payer: 59 | Admitting: Oncology

## 2010-12-24 DIAGNOSIS — C9001 Multiple myeloma in remission: Secondary | ICD-10-CM

## 2010-12-24 LAB — BASIC METABOLIC PANEL
BUN: 17 mg/dL (ref 6–23)
Chloride: 105 mEq/L (ref 96–112)
Creatinine, Ser: 0.89 mg/dL (ref 0.50–1.10)

## 2011-01-25 ENCOUNTER — Telehealth: Payer: Self-pay | Admitting: Oncology

## 2011-01-25 NOTE — Telephone Encounter (Signed)
Called pt, left message, for pt to call us to r/s appt cancelled from Dec due to Epic

## 2011-02-10 ENCOUNTER — Encounter: Payer: Self-pay | Admitting: *Deleted

## 2011-02-11 ENCOUNTER — Ambulatory Visit (HOSPITAL_BASED_OUTPATIENT_CLINIC_OR_DEPARTMENT_OTHER): Payer: 59

## 2011-02-11 ENCOUNTER — Other Ambulatory Visit: Payer: Self-pay

## 2011-02-11 ENCOUNTER — Other Ambulatory Visit: Payer: 59 | Admitting: Lab

## 2011-02-11 DIAGNOSIS — C801 Malignant (primary) neoplasm, unspecified: Secondary | ICD-10-CM

## 2011-02-11 DIAGNOSIS — Z452 Encounter for adjustment and management of vascular access device: Secondary | ICD-10-CM

## 2011-02-11 DIAGNOSIS — C9001 Multiple myeloma in remission: Secondary | ICD-10-CM

## 2011-02-11 MED ORDER — SODIUM CHLORIDE 0.9 % IJ SOLN
10.0000 mL | Freq: Once | INTRAMUSCULAR | Status: AC
  Administered 2011-02-11: 10 mL via INTRAVENOUS
  Filled 2011-02-11: qty 10

## 2011-02-11 MED ORDER — HEPARIN SOD (PORK) LOCK FLUSH 100 UNIT/ML IV SOLN
500.0000 [IU] | Freq: Once | INTRAVENOUS | Status: AC
Start: 2011-02-11 — End: 2011-02-11
  Administered 2011-02-11: 500 [IU] via INTRAVENOUS
  Filled 2011-02-11: qty 5

## 2011-02-11 NOTE — Progress Notes (Signed)
Patient refused lab draws, states she would prefer to reschedule with next flush appointment closer to visit with MD in February.  Empty tubes sent back to lab.  Patient made aware to reschedule appointment.

## 2011-02-11 NOTE — Progress Notes (Unsigned)
Kim in lab presents to nurse's desk stating that pt is refusing labs today, d/t having labs completed at [facility] in November.  Pt would like to r/s labs until next flush & MD appt on 04/08/11.   Note to Dr Cyndie Chime. dph

## 2011-02-12 ENCOUNTER — Telehealth: Payer: Self-pay | Admitting: Oncology

## 2011-02-12 NOTE — Telephone Encounter (Signed)
Talked to pt, gave her appt for 2/12 lab and flush, MD 2/19th

## 2011-02-13 ENCOUNTER — Telehealth: Payer: Self-pay | Admitting: *Deleted

## 2011-02-13 NOTE — Telephone Encounter (Signed)
Pt called reporting that she has her appt. r/s for feb with Dr. Cyndie Chime but now needs to schedule zometa & last done 12/24/10.  ] Returned call to pt @ 3:15pm & per MD notes zometa to be given q 6 mo, therefore not needed until April.  This will be scheduled at next MD visit.   She was OK with this message.

## 2011-03-03 ENCOUNTER — Other Ambulatory Visit: Payer: Self-pay

## 2011-03-03 MED ORDER — VALACYCLOVIR HCL 500 MG PO TABS: 500.0000 mg | ORAL_TABLET | Freq: Every day | ORAL | Status: DC

## 2011-04-21 ENCOUNTER — Other Ambulatory Visit: Payer: Self-pay

## 2011-04-22 ENCOUNTER — Other Ambulatory Visit: Payer: 59 | Admitting: Lab

## 2011-04-22 ENCOUNTER — Ambulatory Visit (HOSPITAL_BASED_OUTPATIENT_CLINIC_OR_DEPARTMENT_OTHER): Payer: 59

## 2011-04-22 DIAGNOSIS — C9000 Multiple myeloma not having achieved remission: Secondary | ICD-10-CM

## 2011-04-22 LAB — CBC WITH DIFFERENTIAL/PLATELET
BASO%: 0.3 % (ref 0.0–2.0)
Basophils Absolute: 0 10*3/uL (ref 0.0–0.1)
EOS%: 2.2 % (ref 0.0–7.0)
Eosinophils Absolute: 0.1 10*3/uL (ref 0.0–0.5)
HCT: 38 % (ref 34.8–46.6)
HGB: 13.1 g/dL (ref 11.6–15.9)
LYMPH%: 31.8 % (ref 14.0–49.7)
MCH: 33.7 pg (ref 25.1–34.0)
MCHC: 34.4 g/dL (ref 31.5–36.0)
MCV: 97.9 fL (ref 79.5–101.0)
MONO#: 0.3 10*3/uL (ref 0.1–0.9)
MONO%: 6.1 % (ref 0.0–14.0)
NEUT#: 2.4 10*3/uL (ref 1.5–6.5)
NEUT%: 59.6 % (ref 38.4–76.8)
Platelets: 129 10*3/uL — ABNORMAL LOW (ref 145–400)
RBC: 3.89 10*6/uL (ref 3.70–5.45)
RDW: 12.7 % (ref 11.2–14.5)
WBC: 4.1 10*3/uL (ref 3.9–10.3)
lymph#: 1.3 10*3/uL (ref 0.9–3.3)

## 2011-04-22 MED ORDER — HEPARIN SOD (PORK) LOCK FLUSH 100 UNIT/ML IV SOLN
500.0000 [IU] | Freq: Once | INTRAVENOUS | Status: AC
  Administered 2011-04-22: 500 [IU] via INTRAVENOUS
  Filled 2011-04-22: qty 5

## 2011-04-22 MED ORDER — SODIUM CHLORIDE 0.9 % IJ SOLN
10.0000 mL | INTRAMUSCULAR | Status: DC | PRN
  Administered 2011-04-22: 10 mL via INTRAVENOUS
  Filled 2011-04-22: qty 10

## 2011-04-24 LAB — KAPPA/LAMBDA LIGHT CHAINS
Kappa:Lambda Ratio: 2.16 — ABNORMAL HIGH (ref 0.26–1.65)
Lambda Free Lght Chn: 0.83 mg/dL (ref 0.57–2.63)

## 2011-04-24 LAB — UIFE/LIGHT CHAINS/TP QN, 24-HR UR
Alpha 2, Urine: DETECTED — AB
Free Kappa Lt Chains,Ur: 2.27 mg/dL (ref 0.14–2.42)
Free Kappa/Lambda Ratio: 16.21 ratio — ABNORMAL HIGH (ref 2.04–10.37)
Free Lt Chn Excr Rate: 38.59 mg/d
Gamma Globulin, Urine: DETECTED — AB
Time: 24 hours
Total Protein, Urine: 3.5 mg/dL

## 2011-04-24 LAB — COMPREHENSIVE METABOLIC PANEL
ALT: 10 U/L (ref 0–35)
AST: 22 U/L (ref 0–37)
Albumin: 4.5 g/dL (ref 3.5–5.2)
Alkaline Phosphatase: 49 U/L (ref 39–117)
Potassium: 3.6 mEq/L (ref 3.5–5.3)
Sodium: 142 mEq/L (ref 135–145)
Total Protein: 6.4 g/dL (ref 6.0–8.3)

## 2011-04-24 LAB — SPEP & IFE WITH QIG
Albumin ELP: 65.9 % (ref 55.8–66.1)
Alpha-2-Globulin: 10.3 % (ref 7.1–11.8)
Beta Globulin: 5.7 % (ref 4.7–7.2)
Total Protein, Serum Electrophoresis: 6.4 g/dL (ref 6.0–8.3)

## 2011-04-29 ENCOUNTER — Encounter: Payer: Self-pay | Admitting: Oncology

## 2011-04-29 ENCOUNTER — Ambulatory Visit (HOSPITAL_BASED_OUTPATIENT_CLINIC_OR_DEPARTMENT_OTHER): Payer: 59 | Admitting: Oncology

## 2011-04-29 DIAGNOSIS — G8929 Other chronic pain: Secondary | ICD-10-CM

## 2011-04-29 DIAGNOSIS — C9002 Multiple myeloma in relapse: Secondary | ICD-10-CM | POA: Insufficient documentation

## 2011-04-29 DIAGNOSIS — Z8619 Personal history of other infectious and parasitic diseases: Secondary | ICD-10-CM | POA: Insufficient documentation

## 2011-04-29 DIAGNOSIS — C9001 Multiple myeloma in remission: Secondary | ICD-10-CM

## 2011-04-29 DIAGNOSIS — B029 Zoster without complications: Secondary | ICD-10-CM

## 2011-04-29 DIAGNOSIS — M549 Dorsalgia, unspecified: Secondary | ICD-10-CM

## 2011-04-29 HISTORY — DX: Zoster without complications: B02.9

## 2011-04-29 HISTORY — DX: Multiple myeloma in remission: C90.01

## 2011-04-29 NOTE — Progress Notes (Signed)
Hematology and Oncology Follow Up Visit  Jessica Cochran [id number] 1961/02/08 51 y.o. 04/29/2011 8:42 PM   Principle Diagnosis: Encounter Diagnoses  Name Primary?  . Multiple myeloma in remission Yes  . Herpes zoster infection   . Chronic back pain      Interim History:   Followup visit for this 51 year old woman with kappa light chain multiple myeloma diagnosed in May of 2009 when she presented with severe back pain, hypercalcemia, anemia, and proteinuria. She had 16 g of protein in her urine. Initial creatinine of 2. She required a lumbar vertebral plasty for a pathologic fracture. She was treated with pulse high dose dexamethasone in the hospital and subsequently induced into a remission with a combination of Revlimid Velcade and dexamethasone. She went on to receive high-dose IV melphalan with autologous stem cell support at [facility] in [place] on 12/30/2007. She has not been on any maintenance therapy. She initially received Zometa monthly and is now on a q. 6 monthly schedule. She continues to have significant back pain requiring chronic narcotic analgesics. However overall she is doing extremely well. She is gaining weight for the first time since I have known her. All of her hair has grown back. 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 She is currently on Valtrex prophylaxis 500 mg daily. No subsequent infections. She is developing a number of cutaneous warts.   Medications: reviewed  Allergies: No Known Allergies  Review of Systems: Constitutional:   No constitutional symptoms at this time Respiratory: No cough or  dyspnea Cardiovascular:  No chest pain or palpitations previous idiopathic tachycardia has resolved Gastrointestinal: No abdominal pain or change in bowel habit Genito-Urinary: No urinary tract symptoms Musculoskeletal: Chronic back pain related to previous myeloma bone involvement well-controlled on current narcotic regimen which she is reluctant to discontinue or taper Neurologic: No focal weakness Skin: See above Remaining ROS negative.  Physical Exam: There were no vitals taken for this visit. Wt Readings from Last 3 Encounters:  No data found for Wt     General appearance: A thin Caucasian woman HENNT: Pharynx no erythema or exudate Lymph nodes: No lymphadenopathy Breasts: Not examined Lungs: Clear to auscultation resonant to percussion Heart: Regular cardiac rhythm no murmur Abdomen: Soft nontender no mass no organomegaly Extremities: No edema no calf tenderness Vascular: No cyanosis Neurologic: Motor strength 5 over 5 reflexes 1+ symmetric Skin: No rash or ecchymosis  Lab Results: Lab Results  Component Value Date   WBC 4.1 04/22/2011   HGB 13.1 04/22/2011   HCT 38.0 04/22/2011   MCV 97.9 04/22/2011   PLT 129* 04/22/2011     Chemistry      Component Value Date/Time   NA 142 04/22/2011 1145   K 3.6 04/22/2011 1145   CL 105 04/22/2011 1145   CO2 27 04/22/2011 1145   BUN 16 04/22/2011 1145   CREATININE 0.76 04/22/2011 1145   CREATININE 0.75 07/17/2009 1541      Component Value Date/Time   CALCIUM 9.5 04/22/2011 1145   ALKPHOS 49 04/22/2011 1145   AST 22 04/22/2011 1145   ALT 10 04/22/2011 1145   BILITOT 0.4 04/22/2011 1145       Radiological Studies:   Impression and Plan: #1.  Kappa light chain multiple myeloma. She remains in a hematologic remission. Total serum immunoglobulins remain suppressed. 24-hour urine collection done 04/22/2011 with only 60 mg of total protein and no monoclonal protein on IFE Serum kappa free light chains 1.79 mg percent done 04/22/11;  lambda 0.83; a ratio 2.16 Repeat skeletal bone survey pending Plan: Continue ongoing surveillance.  #2. History of lumbar vertebraoplasty for pathologic fracture to do myeloma  #3. Chronic back pain secondary to #2.  #4. History of recurrent herpes zoster infections currently not active. Continue long-term Valtrex prophylaxis      CC:. Dr. Aram Beecham white; Dr. Hilda Lias; Dr. Radene Ou at [facility], MD 2/19/20138:42 PM

## 2011-04-30 ENCOUNTER — Telehealth: Payer: Self-pay | Admitting: Oncology

## 2011-04-30 NOTE — Telephone Encounter (Signed)
Called pt, left message regarding Zometa on 06/13/11 and ML,lab visit on 08/26/11

## 2011-05-06 ENCOUNTER — Inpatient Hospital Stay (HOSPITAL_COMMUNITY): Admission: RE | Admit: 2011-05-06 | Payer: 59 | Source: Ambulatory Visit

## 2011-06-13 ENCOUNTER — Ambulatory Visit (HOSPITAL_BASED_OUTPATIENT_CLINIC_OR_DEPARTMENT_OTHER): Payer: 59

## 2011-06-13 VITALS — BP 124/82 | HR 76 | Temp 98.0°F

## 2011-06-13 DIAGNOSIS — C9001 Multiple myeloma in remission: Secondary | ICD-10-CM

## 2011-06-13 MED ORDER — ZOLEDRONIC ACID 4 MG/100ML IV SOLN
4.0000 mg | Freq: Once | INTRAVENOUS | Status: AC
  Administered 2011-06-13: 4 mg via INTRAVENOUS
  Filled 2011-06-13: qty 100

## 2011-06-13 MED ORDER — SODIUM CHLORIDE 0.9 % IV SOLN: Freq: Once | INTRAVENOUS | Status: DC

## 2011-06-13 MED ORDER — HEPARIN SOD (PORK) LOCK FLUSH 100 UNIT/ML IV SOLN
500.0000 [IU] | Freq: Once | INTRAVENOUS | Status: AC | PRN
  Administered 2011-06-13: 500 [IU]
  Filled 2011-06-13: qty 5

## 2011-06-13 MED ORDER — SODIUM CHLORIDE 0.9 % IJ SOLN
10.0000 mL | INTRAMUSCULAR | Status: DC | PRN
  Administered 2011-06-13: 10 mL
  Filled 2011-06-13: qty 10

## 2011-06-13 NOTE — Progress Notes (Signed)
Patient tolerated treatment well with no complaints. Discharged to home. Ambulating.

## 2011-07-22 ENCOUNTER — Other Ambulatory Visit: Payer: Self-pay | Admitting: *Deleted

## 2011-07-22 MED ORDER — VALACYCLOVIR HCL 500 MG PO TABS: 500.0000 mg | ORAL_TABLET | Freq: Every day | ORAL | Status: DC

## 2011-07-30 ENCOUNTER — Telehealth: Payer: Self-pay | Admitting: *Deleted

## 2011-07-30 NOTE — Telephone Encounter (Signed)
Received call from pt stating she was supposed to have some things done but has no received any calls to schedule them.  Called pt & she reports that she thought she was supposed to have a bone survey & port flush but hasn't received a call.  She also wants to know if she needs to get labs before appt with Lonna Cobb NP in June.  Computer note for bone survey shows that it was scheduled but pt didn't show.  Pt reports that no one called her.  Note to Dr. Cyndie Chime to see if & when this needs to be scheduled.

## 2011-07-31 ENCOUNTER — Other Ambulatory Visit: Payer: Self-pay | Admitting: Oncology

## 2011-07-31 DIAGNOSIS — C9001 Multiple myeloma in remission: Secondary | ICD-10-CM

## 2011-08-05 ENCOUNTER — Telehealth: Payer: Self-pay | Admitting: Oncology

## 2011-08-05 NOTE — Telephone Encounter (Signed)
Called pt and left message , regarding labs on 08/19/11

## 2011-08-19 ENCOUNTER — Other Ambulatory Visit (HOSPITAL_BASED_OUTPATIENT_CLINIC_OR_DEPARTMENT_OTHER): Payer: 59 | Admitting: Lab

## 2011-08-19 ENCOUNTER — Ambulatory Visit (HOSPITAL_BASED_OUTPATIENT_CLINIC_OR_DEPARTMENT_OTHER): Payer: 59

## 2011-08-19 DIAGNOSIS — M549 Dorsalgia, unspecified: Secondary | ICD-10-CM

## 2011-08-19 DIAGNOSIS — G8929 Other chronic pain: Secondary | ICD-10-CM

## 2011-08-19 DIAGNOSIS — Z452 Encounter for adjustment and management of vascular access device: Secondary | ICD-10-CM

## 2011-08-19 DIAGNOSIS — C9000 Multiple myeloma not having achieved remission: Secondary | ICD-10-CM

## 2011-08-19 DIAGNOSIS — C9001 Multiple myeloma in remission: Secondary | ICD-10-CM

## 2011-08-19 LAB — COMPREHENSIVE METABOLIC PANEL
ALT: 11 U/L (ref 0–35)
AST: 23 U/L (ref 0–37)
Albumin: 4.2 g/dL (ref 3.5–5.2)
Alkaline Phosphatase: 54 U/L (ref 39–117)
Potassium: 3.9 mEq/L (ref 3.5–5.3)
Sodium: 143 mEq/L (ref 135–145)
Total Bilirubin: 0.3 mg/dL (ref 0.3–1.2)
Total Protein: 6.7 g/dL (ref 6.0–8.3)

## 2011-08-19 LAB — CBC WITH DIFFERENTIAL/PLATELET
BASO%: 0.7 % (ref 0.0–2.0)
EOS%: 2.4 % (ref 0.0–7.0)
Eosinophils Absolute: 0.1 10*3/uL (ref 0.0–0.5)
LYMPH%: 31.2 % (ref 14.0–49.7)
MCH: 33.7 pg (ref 25.1–34.0)
MCHC: 33.7 g/dL (ref 31.5–36.0)
MCV: 99.8 fL (ref 79.5–101.0)
MONO%: 6.1 % (ref 0.0–14.0)
NEUT#: 2.9 10*3/uL (ref 1.5–6.5)
RBC: 4 10*6/uL (ref 3.70–5.45)
RDW: 12.7 % (ref 11.2–14.5)

## 2011-08-19 MED ORDER — HEPARIN SOD (PORK) LOCK FLUSH 100 UNIT/ML IV SOLN
500.0000 [IU] | Freq: Once | INTRAVENOUS | Status: AC
  Administered 2011-08-19: 500 [IU] via INTRAVENOUS
  Filled 2011-08-19: qty 5

## 2011-08-19 MED ORDER — ALTEPLASE 2 MG IJ SOLR
2.0000 mg | Freq: Once | INTRAMUSCULAR | Status: AC | PRN
  Administered 2011-08-19: 2 mg
  Filled 2011-08-19: qty 2

## 2011-08-19 MED ORDER — SODIUM CHLORIDE 0.9 % IJ SOLN
10.0000 mL | INTRAMUSCULAR | Status: DC | PRN
  Administered 2011-08-19: 10 mL via INTRAVENOUS
  Filled 2011-08-19: qty 10

## 2011-08-19 NOTE — Progress Notes (Signed)
Port without blood return, ativase at 1440.  Retried at 1600 without success. Tried again at 1630 no success.  Orders from dr Cyndie Chime to leave activase in, deaccess port and try again tomorrow.  appt set up for 10:00/  Pt agreeable with plan.  dmr

## 2011-08-20 ENCOUNTER — Ambulatory Visit (HOSPITAL_BASED_OUTPATIENT_CLINIC_OR_DEPARTMENT_OTHER): Payer: 59

## 2011-08-20 VITALS — BP 117/76 | HR 79 | Temp 97.6°F

## 2011-08-20 DIAGNOSIS — C9000 Multiple myeloma not having achieved remission: Secondary | ICD-10-CM

## 2011-08-20 LAB — KAPPA/LAMBDA LIGHT CHAINS
Kappa:Lambda Ratio: 7.04 — ABNORMAL HIGH (ref 0.26–1.65)
Lambda Free Lght Chn: 0.99 mg/dL (ref 0.57–2.63)

## 2011-08-20 MED ORDER — SODIUM CHLORIDE 0.9 % IJ SOLN
10.0000 mL | INTRAMUSCULAR | Status: DC | PRN
  Administered 2011-08-20: 10 mL via INTRAVENOUS
  Filled 2011-08-20: qty 10

## 2011-08-20 MED ORDER — HEPARIN SOD (PORK) LOCK FLUSH 100 UNIT/ML IV SOLN
500.0000 [IU] | Freq: Once | INTRAVENOUS | Status: AC
  Administered 2011-08-20: 500 [IU] via INTRAVENOUS
  Filled 2011-08-20: qty 5

## 2011-08-20 NOTE — Progress Notes (Signed)
1030- Port accessed, unable to draw back blood.  Port flushed with ease, flushed with saline and heparin.  Dr. Cyndie Chime made aware via Sabino Snipes, RN-dhp, rn

## 2011-08-26 ENCOUNTER — Other Ambulatory Visit: Payer: 59

## 2011-08-26 ENCOUNTER — Other Ambulatory Visit: Payer: Self-pay | Admitting: Oncology

## 2011-08-26 ENCOUNTER — Telehealth: Payer: Self-pay | Admitting: Oncology

## 2011-08-26 ENCOUNTER — Ambulatory Visit: Payer: 59 | Admitting: Lab

## 2011-08-26 ENCOUNTER — Ambulatory Visit (HOSPITAL_BASED_OUTPATIENT_CLINIC_OR_DEPARTMENT_OTHER): Payer: 59 | Admitting: Nurse Practitioner

## 2011-08-26 VITALS — BP 103/73 | HR 77 | Temp 98.3°F | Ht 59.0 in | Wt 126.9 lb

## 2011-08-26 DIAGNOSIS — C9001 Multiple myeloma in remission: Secondary | ICD-10-CM

## 2011-08-26 DIAGNOSIS — M549 Dorsalgia, unspecified: Secondary | ICD-10-CM

## 2011-08-26 NOTE — Progress Notes (Signed)
OFFICE PROGRESS NOTE  Interval history:  Jessica Cochran is a 51 year old woman with kappa light chain multiple myeloma. Initial diagnosis dates to May 2009 when she presented with severe back pain, hypercalcemia, anemia and proteinuria. She had 16 g of protein in her urine; initial creatinine of 2. She underwent a lumbar vertebroplasty for a pathologic fracture. She was treated with pulse high dose dexamethasone in the hospital and subsequently induced into a remission with a combination of Revlimid, Velcade and dexamethasone. She received high-dose IV melphalan with autologous stem cell support at [facility] on 12/30/2007. She has not been on maintenance therapy. She initially received Zometa monthly and is currently receiving on a 6 month schedule. She is seen today for scheduled followup.  Jessica Cochran reports that overall she is doing well. She has good appetite. She is gaining weight. Chronic back pain is stable to improved. She recently decreased the dose of OxyContin from 80 mg every 12 hours to 60 mg every 12 hours. She takes oxycodone as needed. She estimates taking the oxycodone 2 times a day. No new areas of pain. No interim illnesses or infections. She has intermittent constipation. No hematuria or dysuria. She continues to note "warts" on both hands.   Objective: Blood pressure 103/73, pulse 77, temperature 98.3 F (36.8 C), temperature source Oral, height 4\' 11"  (1.499 m), weight 126 lb 14.4 oz (57.561 kg).  Oropharynx is without thrush or ulceration. No palpable cervical, supraclavicular or axillary lymph nodes. Lungs are clear. No wheezes or rales. Regular cardiac rhythm. Port-A-Cath site is without erythema. Abdomen is soft and nontender. No organomegaly. Extremities are without edema. Calves are soft and nontender. Back is kyphotic. Motor strength 5 over 5. DTRs 1+, symmetric.  Lab Results: Lab Results  Component Value Date   WBC 4.8 08/19/2011   HGB 13.5 08/19/2011   HCT  39.9 08/19/2011   MCV 99.8 08/19/2011   PLT 115* 08/19/2011    Chemistry:    Chemistry      Component Value Date/Time   NA 143 08/19/2011 1353   K 3.9 08/19/2011 1353   CL 104 08/19/2011 1353   CO2 30 08/19/2011 1353   BUN 15 08/19/2011 1353   CREATININE 0.82 08/19/2011 1353   CREATININE 0.75 07/17/2009 1541      Component Value Date/Time   CALCIUM 9.1 08/19/2011 1353   ALKPHOS 54 08/19/2011 1353   AST 23 08/19/2011 1353   ALT 11 08/19/2011 1353   BILITOT 0.3 08/19/2011 1353    08/19/2011 Kappa free light chain 6.97 Lambda free light chain 0.99 Kappa lambda ratio 7.04  Studies/Results: No results found.  Medications: I have reviewed the patient's current medications.  Assessment/Plan:  1. Kappa light chain multiple myeloma with previous treatment as outlined above. Kappa free light chains returned elevated on 08/19/2011. 2. History of lumbar vertebroplasty for pathologic fracture related to myeloma. 3. Chronic back pain secondary to #2. She is followed at the pain clinic. 4. History of recurrent herpes zoster infections. She continues Valtrex prophylaxis.  Disposition-the kappa free light chains returned elevated on 08/19/2011. We are repeating today to verify the result. We will also obtain a 24-hour urine and refer for a skeletal bone survey. She will return for a followup visit in one month. She will contact the office in the interim with any problems.  Plan reviewed with Dr. Cyndie Chime.  Lonna Cobb ANP/GNP-BC

## 2011-08-26 NOTE — Telephone Encounter (Signed)
Gave pt calendar calendar appt for Bone survey and MD visit in July 2013

## 2011-08-28 LAB — IMMUNOFIXATION ELECTROPHORESIS
IgA: 135 mg/dL (ref 69–380)
IgG (Immunoglobin G), Serum: 645 mg/dL — ABNORMAL LOW (ref 690–1700)
Total Protein, Serum Electrophoresis: 6.2 g/dL (ref 6.0–8.3)

## 2011-09-01 ENCOUNTER — Telehealth: Payer: Self-pay

## 2011-09-01 NOTE — Telephone Encounter (Signed)
Received call from pt's husband, Jessica Cochran, wanting to know if pt's light chain results are back, which had to be repeated at office visit last week.  Informed will leave note for providers to review, and office will call him back tomorrow.  He verbalizes understanding.

## 2011-09-02 ENCOUNTER — Ambulatory Visit (HOSPITAL_COMMUNITY)
Admission: RE | Admit: 2011-09-02 | Discharge: 2011-09-02 | Disposition: A | Discharge status: Home / Self Care | Payer: 59 | Source: Ambulatory Visit | Attending: Nurse Practitioner | Admitting: Nurse Practitioner

## 2011-09-02 ENCOUNTER — Telehealth: Payer: Self-pay | Admitting: *Deleted

## 2011-09-02 DIAGNOSIS — M899 Disorder of bone, unspecified: Secondary | ICD-10-CM | POA: Insufficient documentation

## 2011-09-02 DIAGNOSIS — M545 Low back pain, unspecified: Secondary | ICD-10-CM | POA: Insufficient documentation

## 2011-09-02 DIAGNOSIS — C9001 Multiple myeloma in remission: Secondary | ICD-10-CM | POA: Insufficient documentation

## 2011-09-02 DIAGNOSIS — M959 Acquired deformity of musculoskeletal system, unspecified: Secondary | ICD-10-CM | POA: Insufficient documentation

## 2011-09-02 DIAGNOSIS — M5030 Other cervical disc degeneration, unspecified cervical region: Secondary | ICD-10-CM | POA: Insufficient documentation

## 2011-09-02 NOTE — Telephone Encounter (Signed)
Returned call to pt's husband, Baldo Ash after discussing labs with Lonna Cobb NP.  Notified that Light chains mildly elevated but stable per Misty Stanley & will report to Dr. Cyndie Chime upon his return Monday.

## 2011-09-10 ENCOUNTER — Telehealth: Payer: Self-pay | Admitting: *Deleted

## 2011-09-10 NOTE — Telephone Encounter (Signed)
Pt's husband notified of on bone x-ray results per Dr. Cyndie Chime.

## 2011-09-10 NOTE — Telephone Encounter (Signed)
Message copied by Sabino Snipes on Wed Sep 10, 2011  5:12 PM ------      Message from: Levert Feinstein      Created: Sun Sep 07, 2011  5:16 PM       Call pt bone X-rays stable; no new findings

## 2011-09-15 LAB — UIFE/LIGHT CHAINS/TP QN, 24-HR UR
Albumin, U: DETECTED
Alpha 1, Urine: DETECTED — AB
Free Kappa/Lambda Ratio: 43.2 ratio — ABNORMAL HIGH (ref 2.04–10.37)
Free Lambda Excretion/Day: 3.25 mg/d
Free Lambda Lt Chains,Ur: 0.25 mg/dL (ref 0.02–0.67)
Time: 24 hours
Total Protein, Urine: 12.1 mg/dL

## 2011-09-16 ENCOUNTER — Other Ambulatory Visit: Payer: Self-pay | Admitting: *Deleted

## 2011-09-16 ENCOUNTER — Other Ambulatory Visit: Payer: Self-pay | Admitting: Nurse Practitioner

## 2011-09-17 ENCOUNTER — Other Ambulatory Visit: Payer: Self-pay | Admitting: Oncology

## 2011-09-17 ENCOUNTER — Other Ambulatory Visit: Payer: Self-pay | Admitting: Nurse Practitioner

## 2011-09-18 ENCOUNTER — Other Ambulatory Visit: Payer: Self-pay | Admitting: *Deleted

## 2011-09-18 ENCOUNTER — Telehealth: Payer: Self-pay | Admitting: *Deleted

## 2011-09-18 NOTE — Telephone Encounter (Signed)
Called [facility] Short [facility] & scheduled BMBX @ 8am 09/25/11 with Dee.  Cancelled BMBX at [facility] & called [facility] flow/cyto to schedule BMBX.  Pt notified not to come 09/23/11 for MD appt.  This will be cancelled & r/s to 09/30/10 @ 4:30pm.  Pt also notified to be NPO after MN 09/24/11  & to arrive @ 7am 7/18 with a driver.

## 2011-09-18 NOTE — Telephone Encounter (Signed)
R/s MD visit per Dr. Reece Agar, called pt,notified nurse

## 2011-09-19 ENCOUNTER — Encounter (HOSPITAL_COMMUNITY): Payer: Self-pay | Admitting: Pharmacy Technician

## 2011-09-23 ENCOUNTER — Ambulatory Visit: Payer: 59 | Admitting: Oncology

## 2011-09-24 ENCOUNTER — Telehealth: Payer: Self-pay | Admitting: *Deleted

## 2011-09-24 ENCOUNTER — Other Ambulatory Visit: Payer: 59 | Admitting: Lab

## 2011-09-24 NOTE — Telephone Encounter (Signed)
Orders for BMBX faxed to Short Stay/[facility] per Janet's request.

## 2011-09-25 ENCOUNTER — Inpatient Hospital Stay (HOSPITAL_COMMUNITY)
Admission: RE | Admit: 2011-09-25 | Discharge: 2011-09-25 | Disposition: A | Discharge status: Home / Self Care | Payer: 59 | Source: Ambulatory Visit

## 2011-09-25 ENCOUNTER — Other Ambulatory Visit: Payer: 59 | Admitting: Lab

## 2011-09-25 ENCOUNTER — Ambulatory Visit (HOSPITAL_COMMUNITY)
Admission: RE | Admit: 2011-09-25 | Discharge: 2011-09-25 | Disposition: A | Discharge status: Home / Self Care | Payer: 59 | Source: Ambulatory Visit | Attending: Oncology | Admitting: Oncology

## 2011-09-25 ENCOUNTER — Encounter (HOSPITAL_COMMUNITY): Payer: Self-pay

## 2011-09-25 ENCOUNTER — Other Ambulatory Visit (HOSPITAL_COMMUNITY): Payer: Self-pay | Admitting: Oncology

## 2011-09-25 DIAGNOSIS — D696 Thrombocytopenia, unspecified: Secondary | ICD-10-CM | POA: Insufficient documentation

## 2011-09-25 DIAGNOSIS — C9000 Multiple myeloma not having achieved remission: Secondary | ICD-10-CM

## 2011-09-25 HISTORY — DX: Calculus of kidney: N20.0

## 2011-09-25 HISTORY — DX: Unspecified osteoarthritis, unspecified site: M19.90

## 2011-09-25 LAB — CBC WITH DIFFERENTIAL/PLATELET
Basophils Relative: 1 % (ref 0–1)
Eosinophils Relative: 4 % (ref 0–5)
Lymphs Abs: 1.2 10*3/uL (ref 0.7–4.0)
MCH: 32.7 pg (ref 26.0–34.0)
Neutrophils Relative %: 53 % (ref 43–77)
Platelets: 95 10*3/uL — ABNORMAL LOW (ref 150–400)
RBC: 3.95 MIL/uL (ref 3.87–5.11)
RDW: 12.2 % (ref 11.5–15.5)
WBC: 4 10*3/uL (ref 4.0–10.5)

## 2011-09-25 MED ORDER — OXYCODONE HCL 5 MG PO TABS: 5.0000 mg | ORAL_TABLET | ORAL | Status: DC | PRN

## 2011-09-25 MED ORDER — MIDAZOLAM HCL 5 MG/5ML IJ SOLN
INTRAMUSCULAR | Status: DC | PRN
  Administered 2011-09-25 (×5): 1 mg via INTRAVENOUS

## 2011-09-25 MED ORDER — ALTEPLASE 100 MG IV SOLR
5.0000 mg | Freq: Once | INTRAVENOUS | Status: DC
  Filled 2011-09-25: qty 5

## 2011-09-25 MED ORDER — IOHEXOL 300 MG/ML  SOLN
5.0000 mL | Freq: Once | INTRAMUSCULAR | Status: AC | PRN
  Administered 2011-09-25: 5 mL via INTRAVENOUS

## 2011-09-25 MED ORDER — MIDAZOLAM HCL 10 MG/2ML IJ SOLN
INTRAMUSCULAR | Status: AC
  Filled 2011-09-25: qty 2

## 2011-09-25 MED ORDER — HEPARIN SOD (PORK) LOCK FLUSH 100 UNIT/ML IV SOLN
500.0000 [IU] | Freq: Once | INTRAVENOUS | Status: AC
  Administered 2011-09-25: 500 [IU] via INTRAVENOUS

## 2011-09-25 MED ORDER — OXYCODONE HCL 40 MG PO TB12
60.0000 mg | ORAL_TABLET | Freq: Two times a day (BID) | ORAL | Status: AC
  Administered 2011-09-25: 60 mg via ORAL

## 2011-09-25 MED ORDER — SODIUM CHLORIDE 0.9 % IV SOLN
Freq: Once | INTRAVENOUS | Status: AC
  Administered 2011-09-25: 08:00:00 via INTRAVENOUS

## 2011-09-25 NOTE — ED Notes (Signed)
Family updated as to patient's status.

## 2011-09-25 NOTE — ED Notes (Signed)
Orders obtained for evaluation in Interventional Radiology for [facility] evaluation

## 2011-09-25 NOTE — ED Notes (Signed)
Patient denies pain and is resting comfortably.  

## 2011-09-25 NOTE — ED Notes (Signed)
Ambulated in hall with minimal assist. Spot on post iliac dressing size of dime ,no oozinf. Coordinated with Interventional Radiology that pt is to be discharged from Short Stay with [facility] accessed, Saline well intact and pt has a gown to Rm #7 at 1000 for her evaluation of PAC

## 2011-09-25 NOTE — ED Notes (Signed)
Dressing to post iliac area with gauze and hypafix

## 2011-09-25 NOTE — ED Notes (Signed)
Procedure ends. pt remains supine and  sleeping

## 2011-09-25 NOTE — Progress Notes (Signed)
Pt had bone marrow biopsy this AM and PAC was accessed; however, no blood return. She was sent to IR for [facility] evaluation and brought back to short stay for 4 hour TPA infusion .  Patient voices concern that she is not sure she wants TPA based on her platelets and she is not sure she wants TPA infusion without Dr Cyndie Chime knowing she was to receive it. Paged Dr Cyndie Chime. Informed him of above. He states pt had TPA at cancer center last week and it was unsuccessful. Writer informs him that was a dwell and this was infusion. He does not want pt to have TPA infusion today and will address her PAC needs next Tuesday at her office visit to review bone marrow biopsy results and need for [facility] in future. Went over this with pt. Only thing done during this visit was oxycontin 60 mg PO to keep pt on her pain medication schedule.

## 2011-09-25 NOTE — ED Notes (Signed)
Dressing on post iliac ares CDI pt placed supine with HOB 45 degrees

## 2011-09-25 NOTE — Procedures (Signed)
Interventional Radiology Procedure Note  Procedure: Port injection Complications: None Recommendations: - TPA infusion for tx of fibrin sheath - Recheck port in 4 hrs Signed,  Sterling Big, MD Vascular & Interventional Radiologist [facility] Radiology

## 2011-09-25 NOTE — Progress Notes (Signed)
Pt arrived today for Bone Marrow Biopsy. Requests that her PAC be used but "but last time they tried to access it there was no blood return and I want you to try today and see if you can get a blood return" Pt states 3 weeks ago it was accessed and also had TPA instilled but still no blood return. Today accessed PAC per protocol and no blood return but flushes easily. Left PAC accessed and capped and will inform Dr Cyndie Chime when he arrives for procedure today

## 2011-09-25 NOTE — ED Notes (Signed)
Patient is resting comfortably. 

## 2011-09-25 NOTE — CV Procedure (Signed)
Bone Marrow Biopsy and Aspiration Procedure Note   Informed consent was obtained and potential risks including bleeding, infection and pain were reviewed with the patient.  Posterior iliac crest(s) prepped with Betadine.  Lidocaine 2% local anesthesia infiltrated into the subcutaneous tissue. Premedication: Versed 3 mg  Left bone marrow biopsy and  aspirate was obtained.   The procedure was tolerated well and there were no complications.  Specimens sent for: routine histopathologic stains and sectioning,  and cytogenetics  Indications:  Re-evaluate rising lambda light chains in patient previously treated for multiple myeloma.  Physician: Levert Feinstein

## 2011-09-25 NOTE — Sedation Documentation (Signed)
Medication dose calculated and verified for: Versed 4mg 

## 2011-09-30 ENCOUNTER — Ambulatory Visit (HOSPITAL_BASED_OUTPATIENT_CLINIC_OR_DEPARTMENT_OTHER): Payer: 59 | Admitting: Oncology

## 2011-09-30 VITALS — BP 116/81 | HR 97 | Temp 97.0°F | Ht 59.0 in | Wt 126.5 lb

## 2011-09-30 DIAGNOSIS — C9001 Multiple myeloma in remission: Secondary | ICD-10-CM

## 2011-09-30 NOTE — Progress Notes (Signed)
Hematology and Oncology Follow Up Visit  Jessica Cochran [id number] 10-31-1960 51 y.o. 09/30/2011 6:28 PM   Principle Diagnosis: Encounter Diagnosis  Name Primary?  . Multiple myeloma in remission Yes     Interim History:  The patient comes in with her husband today to review results of a complete restaging evaluation for her myeloma. 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 She had 16 g of protein in her urine. Initial creatinine of 2. She required a lumbar vertebroplasty for a pathologic fracture. She was treated with pulse high dose dexamethasone in the hospital and subsequently induced into a remission with a combination of Revlimid Velcade and dexamethasone. She went on to receive high-dose IV melphalan with autologous stem cell support at [facility] in [place] on 12/30/2007. She has not been on any maintenance therapy. She initially received Zometa monthly and is now on a q. 6 monthly schedule  Recent routine survey laboratory studies done on 08/19/2011 showed a rise in her kappa serum free light chains from 1.79 recorded on February 12 26.97 on June 11. Kappa to lambda ratio 7.04. This prompted additional testing. Repeat kappa free light chains to assess reproducibility done on June 18 showed value of 6.75 mg percent. 24 urine collection done July 3 showed 157 mg total protein in a volume of 1300 mL compared with 60 mg of protein on February 12 study. Urine was positive for monoclonal free kappa light chains on immunofixation electrophoresis. A skeletal bone survey showed no change in chronic bone lesions. I did a bone marrow aspiration and biopsy on July 18. which shows 4% plasma cells. No large aggregates or sheets of plasma cells. Immunohistochemical staining showed positive polyclonal staining pattern although there is kappa light chain excess "most  suggestive of subtle/early involvement by plasma cell dyscrasia". Cytogenetic studies are pending.   Medications: reviewed    Physical Exam: Blood pressure 116/81, pulse 97, temperature 97 F (36.1 C), temperature source Oral, height 4\' 11"  (1.499 m), weight 126 lb 8 oz (57.38 kg). Wt Readings from Last 3 Encounters:  09/30/11 126 lb 8 oz (57.38 kg)  09/25/11 130 lb (58.968 kg)  08/26/11 126 lb 14.4 oz (57.561 kg)     Lab Results: Lab Results  Component Value Date   WBC 4.0 09/25/2011   HGB 12.9 09/25/2011   HCT 38.8 09/25/2011   MCV 98.2 09/25/2011   PLT 95* 09/25/2011     Chemistry      Component Value Date/Time   NA 143 08/19/2011 1353   K 3.9 08/19/2011 1353   CL 104 08/19/2011 1353   CO2 30 08/19/2011 1353   BUN 15 08/19/2011 1353   CREATININE 0.82 08/19/2011 1353   CREATININE 0.75 07/17/2009 1541      Component Value Date/Time   CALCIUM 9.1 08/19/2011 1353   ALKPHOS 54 08/19/2011 1353   AST 23 08/19/2011 1353   ALT 11 08/19/2011 1353   BILITOT 0.3 08/19/2011 1353       Radiological Studies: Ir Transcath/rx/inf Non Thrombolysis  09/25/2011  *RADIOLOGY REPORT*  Clinical Data: 51 year old female with a history of multiple myeloma and a right IJ approach portacatheter which was placed in 2009.  She recently began experiencing difficulty with aspiration of blood through the port.  It continues to flush well.  She presents for port injection.  CV CATH DECLOT W/THROMBOLYTIC INFUSION  Comparison: Port placement radiographs  08/06/2007  Findings: Spot radiographs of the portacatheter demonstrate there is migrated inferiorly slightly, and the tip of the catheter is now at the superior cavoatrial junction than was previously in the upper right atrium. An injection of contrast material through the portacatheter under direct fluoroscopic visualization reveals a fibrin sheath at the catheter tip.  There are several mobile filling defects within the port reservoir which are strongly favored to  represent air bubbles rather than focal thrombus.  These were successfully aspirated.  IMPRESSION:  1.  There is a fibrin sheath at the tip of the Port-A-Cath catheter.  This is likely impeding flow during aspiration, but would not prevent infusion. 2.  Recommend routine TPA infusion for treatment of the fibrin sheath. Infusion of 5 mg of TPA over 4 hours is a very safe, and noninvasive initial treatment of a fibrin sheath.  If unsuccessful, the next options include catheter stripping from a right femoral venous approach, or portacatheter removal and replacement.  Signed,  Sterling Big, MD Vascular & Interventional Radiologist [facility]  Original Report Authenticated By: Orlando Penner Line Injection  09/25/2011  *RADIOLOGY REPORT*  Clinical Data: 51 year old female with a history of multiple myeloma and a right IJ approach portacatheter which was placed in 2009.  She recently began experiencing difficulty with aspiration of blood through the port.  It continues to flush well.  She presents for port injection.  CV CATH DECLOT W/THROMBOLYTIC INFUSION  Comparison: Port placement radiographs 08/06/2007  Findings: Spot radiographs of the portacatheter demonstrate there is migrated inferiorly slightly, and the tip of the catheter is now at the superior cavoatrial junction than was previously in the upper right atrium. An injection of contrast material through the portacatheter under direct fluoroscopic visualization reveals a fibrin sheath at the catheter tip.  There are several mobile filling defects within the port reservoir which are strongly favored to represent air bubbles rather than focal thrombus.  These were successfully aspirated.  IMPRESSION:  1.  There is a fibrin sheath at the tip of the Port-A-Cath catheter.  This is likely impeding flow during aspiration, but would not prevent infusion. 2.  Recommend routine TPA infusion for treatment of the fibrin sheath. Infusion of 5 mg of TPA over 4  hours is a very safe, and noninvasive initial treatment of a fibrin sheath.  If unsuccessful, the next options include catheter stripping from a right femoral venous approach, or portacatheter removal and replacement.  Signed,  Sterling Big, MD Vascular & Interventional Radiologist [facility] Radiology  Original Report Authenticated By: Alvino Blood Bone Survey Met  09/02/2011  *RADIOLOGY REPORT*  Clinical Data: History of multiple myeloma.  Pain in upper and lower back.  Pain under breasts.  METASTATIC BONE SURVEY  Comparison: 05/17/2010  Findings: Lateral view of the calvarium demonstrates  tiny nonspecific lucent lesions which are not significantly changed.  AP, swimmers and lateral views of the cervical spine demonstrate no focal lytic lesion.  C5-C6 degenerative disc disease is age advanced.  Unchanged.  AP and lateral views of the thoracic spine demonstrate accentuation of expected thoracic kyphosis.  Prior vertebral augmentation at L1.  Compression deformities T9 through T12 are similar.  There also mild T3 and moderate T5 compression deformities which are felt to be similar.  Diffuse osteopenia.  AP and lateral views of the lumbar spine demonstrate mild L2 compression deformity, which is unchanged.  Mild focal kyphosis about the T12-L1 level.  Suspect mild L4 compression deformity, also  similar.  AP view pelvis demonstrates no focal lytic lesion.  AP views of bilateral femurs demonstrate no focal lytic lesion. Mild right hip osteoarthritis.  AP views of bilateral humeri demonstrate subtle heterogeneous appearance of the proximal humeri, similar to on the prior.  IMPRESSION: Overall similar appearance of the osseous structures.  Diffuse osteopenia with subtle lucent foci within the proximal humeri and calvarium.  Cannot exclude residual myeloma.  Similar appearance of thoracolumbar compression deformities, status post vertebral augmentation at L1.  Original Report Authenticated By: Consuello Bossier,  M.D.    Impression: Early progression of kappa light chain myeloma  I had a lengthy 45 minute discussion with the patient and her husband with respect to treatment options. I think if we begin treatment now at a low tumor burden that we might not have to use as aggressive an approach. I would consider using low-dose daily Revlimid 10 mg along with weekly Decadron. 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 We reviewed the availability of the 2 new drugs pomalidomide and carfilzomab. My inclination would be to reserve these drugs for the future.  I would like to discuss her situation with Dr. Radene Ou at [facility] to get his opinion on management. She does have additional stem cells frozen away that might be integrated into a treatment program.  I will schedule a short interval followup visit for the patient after my discussions with Dr. Lance Bosch.  CC: Dr. Radene Ou [facility]; Dr. Laurann Montana; Dr. Hilda Lias; Dr. Thyra Breed    Levert Feinstein, MD 7/23/20136:28 PM

## 2011-10-01 ENCOUNTER — Telehealth: Payer: Self-pay | Admitting: *Deleted

## 2011-10-01 NOTE — Telephone Encounter (Signed)
left voice message to inform the patient of the new date and time 

## 2011-10-08 ENCOUNTER — Telehealth: Payer: Self-pay | Admitting: Oncology

## 2011-10-08 NOTE — Telephone Encounter (Signed)
pt called to verify appt time  aom

## 2011-10-10 ENCOUNTER — Other Ambulatory Visit: Payer: Self-pay | Admitting: *Deleted

## 2011-10-10 ENCOUNTER — Telehealth: Payer: Self-pay | Admitting: Oncology

## 2011-10-10 ENCOUNTER — Other Ambulatory Visit (HOSPITAL_BASED_OUTPATIENT_CLINIC_OR_DEPARTMENT_OTHER): Payer: 59 | Admitting: Lab

## 2011-10-10 ENCOUNTER — Ambulatory Visit (HOSPITAL_BASED_OUTPATIENT_CLINIC_OR_DEPARTMENT_OTHER): Payer: 59 | Admitting: Oncology

## 2011-10-10 VITALS — BP 114/84 | HR 90 | Temp 96.8°F | Resp 20 | Ht 59.0 in | Wt 126.7 lb

## 2011-10-10 DIAGNOSIS — C9002 Multiple myeloma in relapse: Secondary | ICD-10-CM

## 2011-10-10 DIAGNOSIS — C9000 Multiple myeloma not having achieved remission: Secondary | ICD-10-CM

## 2011-10-10 DIAGNOSIS — C9001 Multiple myeloma in remission: Secondary | ICD-10-CM

## 2011-10-10 LAB — CBC WITH DIFFERENTIAL/PLATELET
BASO%: 1 % (ref 0.0–2.0)
Basophils Absolute: 0 10*3/uL (ref 0.0–0.1)
EOS%: 3.9 % (ref 0.0–7.0)
MCH: 33.4 pg (ref 25.1–34.0)
MCHC: 33.4 g/dL (ref 31.5–36.0)
MCV: 100.2 fL (ref 79.5–101.0)
MONO%: 8.3 % (ref 0.0–14.0)
RBC: 3.84 10*6/uL (ref 3.70–5.45)
RDW: 12.4 % (ref 11.2–14.5)
lymph#: 1.1 10*3/uL (ref 0.9–3.3)

## 2011-10-10 MED ORDER — SULFAMETHOXAZOLE-TRIMETHOPRIM 800-160 MG PO TABS: 1.0000 | ORAL_TABLET | ORAL | Status: DC

## 2011-10-10 MED ORDER — LENALIDOMIDE 25 MG PO CAPS: 25.0000 mg | ORAL_CAPSULE | Freq: Every day | ORAL | Status: DC

## 2011-10-10 MED ORDER — DEXAMETHASONE 4 MG PO TABS: 20.0000 mg | ORAL_TABLET | Freq: Two times a day (BID) | ORAL | Status: AC

## 2011-10-10 NOTE — Telephone Encounter (Signed)
Gave pt apt for August 23 lab only and Md visit with lab in September 2014

## 2011-10-10 NOTE — Telephone Encounter (Signed)
Information given to pt on revlimid & pt agreement form reviewed with pt & signed by pt & Dr Cyndie Chime & faxed to [employer].

## 2011-10-12 ENCOUNTER — Encounter: Payer: Self-pay | Admitting: Oncology

## 2011-10-12 DIAGNOSIS — C9002 Multiple myeloma in relapse: Secondary | ICD-10-CM

## 2011-10-12 HISTORY — DX: Multiple myeloma in relapse: C90.02

## 2011-10-13 ENCOUNTER — Telehealth: Payer: Self-pay | Admitting: *Deleted

## 2011-10-13 NOTE — Progress Notes (Signed)
CC:   Stacie Acres. Cliffton Asters, M.D. Hilda Lias, M.D. Kathrin Penner. Vear Clock, M.D. Radene Ou, MD  Short interval followup visit for this pleasant 51 year old woman with early relapse of kappa light chain multiple myeloma.  Please see my 07/23 note for full details of previous diagnosis and treatment.  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  Email Correspondence from Dr. Radene Ou and I spoke with Dr. Elmon Kirschner by phone.  Given her early relapse and what I believe is a low tumor burden, Dr. Vicente Serene was in agreement that a reasonable re-induction regimen would be full-dose lenalidomide plus weekly dexamethasone.  I discussed this and other options with the patient and her husband again today.  PHYSICAL EXAM:  Unchanged from 2 weeks ago.  Head and neck are normal. Lungs are clear, resonant to percussion.  Regular cardiac rhythm.  No murmur.  No lymphadenopathy.  Abdomen soft, nontender.  No mass, no organomegaly.  Extremities no edema.  No calf tenderness.  Neurologic, back pain when she tries to lie supine which is chronic and requires narcotic analgesics.  Motor strength is 5/5.  Reflexes 1+ symmetric. Sensation intact to vibration over the fingertips by tuning fork exam.  Additional data from her bone marrow biopsy done 3 weeks ago, cytogenetic studies including FISH for common myeloma deletions came back normal.  IMPRESSION:  Kappa light chain multiple myeloma, early relapse.  PLAN: 1. Begin Revlimid 25 mg 21 days on/7 day rest and Decadron 20 mg     b.i.d. weekly.  Anticipate this will start on August 12. 2. Continue Valtrex prophylaxis in view of recurrent herpes zoster     infections. 3. Resume double strength Septra tablets Monday, Wednesday and Friday     for Pneumocystis prophylaxis. 4. I had decreased her Zometa infusions to every 6 months since she     had been receiving Zometa monthly for 2  years.  I will keep her on     a q.6 month schedule at this time.  Followup skeletal bone survey     done as part of her restaging evaluation did not show any new     lesions.    ______________________________ Levert Feinstein, M.D., F.A.C.P. JMG/MEDQ  D:  10/10/2011  T:  10/10/2011  Job:  [id number]

## 2011-10-13 NOTE — Telephone Encounter (Signed)
Pt. Reports her last menstrual period was December 2008.   Biologics called and patient will need to get her Revlimid from [facility].  Phone number is [phone]. Dr. Cyndie Chime would like her to be on ASA 81mg  with revlimid. LM on patients VM in response to her question. Pt. Has been on Revlimid before.

## 2011-10-14 LAB — BASIC METABOLIC PANEL
BUN: 16 mg/dL (ref 6–23)
Chloride: 105 mEq/L (ref 96–112)
Creatinine, Ser: 0.83 mg/dL (ref 0.50–1.10)
Glucose, Bld: 74 mg/dL (ref 70–99)

## 2011-10-14 LAB — KAPPA/LAMBDA LIGHT CHAINS
Kappa:Lambda Ratio: 11.88 — ABNORMAL HIGH (ref 0.26–1.65)
Lambda Free Lght Chn: 0.76 mg/dL (ref 0.57–2.63)

## 2011-10-14 LAB — IMMUNOFIXATION ELECTROPHORESIS: IgA: 149 mg/dL (ref 69–380)

## 2011-10-15 ENCOUNTER — Encounter: Payer: Self-pay | Admitting: Oncology

## 2011-10-15 NOTE — Progress Notes (Signed)
[facility] [id number] approved revlimid from 10/14/11-10/13/13.

## 2011-10-17 ENCOUNTER — Telehealth: Payer: Self-pay | Admitting: *Deleted

## 2011-10-17 NOTE — Telephone Encounter (Signed)
Pt left vm stating that revlimid will be delivered on Sat & she will start mon.  She wants to know if she should take in the pm or does it matter & is it OK for her to take her aspirin with the revlimid.   Message left on pt's identified vm with OK to take revlimid in the pm since it can make her sleepy & OK to take aspirin 81mg  daily with revlimid per Dr Cyndie Chime.

## 2011-10-20 ENCOUNTER — Telehealth: Payer: Self-pay | Admitting: *Deleted

## 2011-10-31 ENCOUNTER — Telehealth: Payer: Self-pay | Admitting: *Deleted

## 2011-10-31 ENCOUNTER — Other Ambulatory Visit (HOSPITAL_BASED_OUTPATIENT_CLINIC_OR_DEPARTMENT_OTHER): Payer: 59 | Admitting: Lab

## 2011-10-31 DIAGNOSIS — C9002 Multiple myeloma in relapse: Secondary | ICD-10-CM

## 2011-10-31 LAB — CBC WITH DIFFERENTIAL/PLATELET
BASO%: 0.3 % (ref 0.0–2.0)
HCT: 36.9 % (ref 34.8–46.6)
LYMPH%: 14.6 % (ref 14.0–49.7)
MCH: 34 pg (ref 25.1–34.0)
MCHC: 34.1 g/dL (ref 31.5–36.0)
MCV: 99.6 fL (ref 79.5–101.0)
MONO#: 0.4 10*3/uL (ref 0.1–0.9)
MONO%: 7.3 % (ref 0.0–14.0)
NEUT%: 73.6 % (ref 38.4–76.8)
Platelets: 102 10*3/uL — ABNORMAL LOW (ref 145–400)
WBC: 6.1 10*3/uL (ref 3.9–10.3)

## 2011-10-31 NOTE — Telephone Encounter (Signed)
Pt. Here for lab and is concerned about her left ankle.  It is sore, slightly pink and warm to touch.  Vein looks swollen. Would like nurse to check.   Ankle does not appear swollen, it is warm and slightly pink.  Ramond Craver NP examined pt in lobby.  Pt. Does not remember hitting it.   Recommended ice pack for 30 min.  Area should resolve completely by Tuesday.  If any further sx- increase swelling,redness, pain... Let us know.  Husband took picture of ankle on cell phone to compare if sx worsen.

## 2011-11-11 ENCOUNTER — Telehealth: Payer: Self-pay | Admitting: *Deleted

## 2011-11-11 NOTE — Telephone Encounter (Signed)
Patient called [id number] her revlimid.  Called her back and left message that yes, she needs to call number listed.  Our office has a part to do and it takes both of [place] doing our part to get the medication refilled.  Asked her to call us if she has any questions.

## 2011-11-12 ENCOUNTER — Other Ambulatory Visit: Payer: Self-pay | Admitting: *Deleted

## 2011-11-12 ENCOUNTER — Other Ambulatory Visit (HOSPITAL_BASED_OUTPATIENT_CLINIC_OR_DEPARTMENT_OTHER): Payer: 59 | Admitting: Lab

## 2011-11-12 DIAGNOSIS — C9002 Multiple myeloma in relapse: Secondary | ICD-10-CM

## 2011-11-12 DIAGNOSIS — C9000 Multiple myeloma not having achieved remission: Secondary | ICD-10-CM

## 2011-11-12 LAB — CBC WITH DIFFERENTIAL/PLATELET
BASO%: 0.8 % (ref 0.0–2.0)
EOS%: 6.4 % (ref 0.0–7.0)
HCT: 35.5 % (ref 34.8–46.6)
LYMPH%: 27.1 % (ref 14.0–49.7)
MCH: 33 pg (ref 25.1–34.0)
MCHC: 33.5 g/dL (ref 31.5–36.0)
NEUT%: 50.4 % (ref 38.4–76.8)
lymph#: 1.3 10*3/uL (ref 0.9–3.3)

## 2011-11-12 MED ORDER — LENALIDOMIDE 25 MG PO CAPS: 25.0000 mg | ORAL_CAPSULE | Freq: Every day | ORAL | Status: DC

## 2011-11-14 LAB — IMMUNOFIXATION ELECTROPHORESIS: IgM, Serum: 37 mg/dL — ABNORMAL LOW (ref 52–322)

## 2011-11-14 LAB — KAPPA/LAMBDA LIGHT CHAINS: Kappa:Lambda Ratio: 3.25 — ABNORMAL HIGH (ref 0.26–1.65)

## 2011-11-17 ENCOUNTER — Ambulatory Visit (HOSPITAL_BASED_OUTPATIENT_CLINIC_OR_DEPARTMENT_OTHER): Payer: 59 | Admitting: Oncology

## 2011-11-17 ENCOUNTER — Telehealth: Payer: Self-pay | Admitting: Oncology

## 2011-11-17 VITALS — BP 118/79 | HR 85 | Temp 98.0°F | Resp 18 | Ht 59.0 in | Wt 129.4 lb

## 2011-11-17 DIAGNOSIS — C9002 Multiple myeloma in relapse: Secondary | ICD-10-CM

## 2011-11-17 NOTE — Telephone Encounter (Signed)
appts made and printed for pt aom °

## 2011-11-17 NOTE — Progress Notes (Signed)
Hematology and Oncology Follow Up Visit  Jessica Cochran [id number] 04/03/1960 51 y.o. 11/17/2011 12:49 PM   Principle Diagnosis: Encounter Diagnosis  Name Primary?  . Multiple myeloma in relapse Yes     Interim History:   Followup visit for this pleasant 51 year old woman with early relapse of kappa light chain multiple myeloma. Or free light chains rose from normal to 9.03 by August 2 of this year. She was started on Revlimid 25 mg daily 3 weeks on one-week off and dexamethasone 40 mg weekly beginning on August 12. I am very pleased to report that after only one cycle her Or free light chain level is down to 2.47 mg percent as of September 4. She is tolerating the drug well except for some increased fatigue. No skin rash. No constipation. And most importantly, no significant myelosuppression so far.  She just caught a cold from her husband but was able to fight this off actually better than he was and he is not immunocompromised. She had a cough productive of yellow sputum, sinusitis, no fever. Sputum already clearing and cough resolving.  Medications: reviewed  Allergies: No Known Allergies  Review of Systems: Constitutional:   See above Respiratory: See above Cardiovascular:  No chest pain or palpitations Gastrointestinal: See above Genito-Urinary: No urinary tract symptoms Musculoskeletal: No muscle or bone pain Neurologic: No headache or change in vision Skin: No rash or ecchymosis Remaining ROS negative.  Physical Exam: Blood pressure 118/79, pulse 85, temperature 98 F (36.7 C), temperature source Oral, resp. rate 18, height 4\' 11"  (1.499 m), weight 129 lb 6.4 oz (58.695 kg). Wt Readings from Last 3 Encounters:  11/17/11 129 lb 6.4 oz (58.695 kg)  10/10/11 126 lb 11.2 oz (57.471 kg)  09/30/11 126 lb 8 oz (57.38 kg)     General appearance: Thin Caucasian woman HENNT: Pharynx no erythema or exudate Lymph nodes: No lymphadenopathy Breasts: Lungs: Clear to  auscultation resonant to percussion Heart: Regular rhythm no murmur Abdomen: Soft nontender Extremities: No edema no calf tenderness Vascular: No cyanosis Neurologic: Motor strength 5 over 5 reflexes 1+ symmetric minimal decrease in vibration sense over the fingertips by tuning fork exam Skin: No rash or ecchymosis  Lab Results: Lab Results  Component Value Date   WBC 4.7 11/12/2011   HGB 11.9 11/12/2011   HCT 35.5 11/12/2011   MCV 98.3 11/12/2011   PLT 112* 11/12/2011     Chemistry      Component Value Date/Time   NA 143 10/10/2011 1023   K 3.8 10/10/2011 1023   CL 105 10/10/2011 1023   CO2 29 10/10/2011 1023   BUN 16 10/10/2011 1023   CREATININE 0.83 10/10/2011 1023   CREATININE 0.75 07/17/2009 1541      Component Value Date/Time   CALCIUM 9.3 10/10/2011 1023   ALKPHOS 54 08/19/2011 1353   AST 23 08/19/2011 1353   ALT 11 08/19/2011 1353   BILITOT 0.3 08/19/2011 1353       Radiological Studies: No results found.  Impression and Plan: #1. Kappa light chain myeloma initially diagnosed  in May of 2009 when she presented with severe back pain, hypercalcemia, anemia, and proteinuria. She had 16 g of protein in her urine. Initial creatinine of 2. She required a lumbar vertebroplasty for a pathologic fracture. She was treated with pulse high dose dexamethasone in the hospital and subsequently induced into a remission with a combination of Revlimid Velcade and dexamethasone. She went on to receive high-dose IV melphalan with autologous stem cell  support at [facility] in [place] on 12/30/2007. She has not been on any maintenance therapy. She initially received Zometa monthly and is now on a q. 6 monthly schedule  Recent routine survey laboratory studies done on 08/19/2011 showed a rise in her kappa serum free light chains from 1.79 recorded on February 12 26.97 on June 11. Kappa to lambda ratio 7.04. This prompted additional testing. Repeat kappa free light chains to assess reproducibility done on  June 18 showed value of 6.75 mg percent. 24 urine collection done July 3 showed 157 mg total protein in a volume of 1300 mL compared with 60 mg of protein on February 12 study. Urine was positive for monoclonal free kappa light chains on immunofixation electrophoresis.  A skeletal bone survey showed no change in chronic bone lesions.  I did a bone marrow aspiration and biopsy on July 18. which showed 4% plasma cells. No large aggregates or sheets of plasma cells. Immunohistochemical staining showed positive polyclonal staining pattern although there is kappa light chain excess "most suggestive of subtle/early involvement by plasma cell dyscrasia". Cytogenetic studies including Fish for most common cytogenetic abnormalities associated with myeloma was normal.  She is now showing a early positive response to Revlimid and dexamethasone started on August 12. Plan: Continue Revlimid/dexamethasone  #2. History of recurrent herpes zoster infections. She will continue on prophylactic Valtrex.  #3. Status post initial lumbar vertebroplasty for pathologic fracture related to myeloma at diagnosis 4 years ago. She has had chronic pain and is still on narcotic analgesics at a stable dose.  #4. Resolving mild bronchitis    CC:. Dr. Laurann Montana; Dr. Hilda Lias; Dr. Radene Ou [facility]   Levert Feinstein, MD 9/9/201312:49 PM

## 2011-11-21 ENCOUNTER — Telehealth: Payer: Self-pay | Admitting: Oncology

## 2011-11-21 NOTE — Telephone Encounter (Signed)
Talked to patient and gave her appt for September and October 2013

## 2011-12-01 ENCOUNTER — Other Ambulatory Visit (HOSPITAL_BASED_OUTPATIENT_CLINIC_OR_DEPARTMENT_OTHER): Payer: 59 | Admitting: Lab

## 2011-12-01 DIAGNOSIS — C9002 Multiple myeloma in relapse: Secondary | ICD-10-CM

## 2011-12-01 LAB — CBC WITH DIFFERENTIAL/PLATELET
BASO%: 0.8 % (ref 0.0–2.0)
LYMPH%: 21.9 % (ref 14.0–49.7)
MCH: 34.3 pg — ABNORMAL HIGH (ref 25.1–34.0)
MCHC: 33.8 g/dL (ref 31.5–36.0)
MCV: 101.4 fL — ABNORMAL HIGH (ref 79.5–101.0)
MONO%: 7.9 % (ref 0.0–14.0)
Platelets: 106 10*3/uL — ABNORMAL LOW (ref 145–400)
RBC: 3.92 10*6/uL (ref 3.70–5.45)

## 2011-12-01 LAB — COMPREHENSIVE METABOLIC PANEL (CC13)
ALT: 16 U/L (ref 0–55)
Albumin: 3.7 g/dL (ref 3.5–5.0)
CO2: 27 mEq/L (ref 22–29)
Calcium: 9 mg/dL (ref 8.4–10.4)
Chloride: 106 mEq/L (ref 98–107)
Creatinine: 0.8 mg/dL (ref 0.6–1.1)
Sodium: 142 mEq/L (ref 136–145)
Total Protein: 5.7 g/dL — ABNORMAL LOW (ref 6.4–8.3)

## 2011-12-02 ENCOUNTER — Other Ambulatory Visit: Payer: Self-pay | Admitting: *Deleted

## 2011-12-02 DIAGNOSIS — C9000 Multiple myeloma not having achieved remission: Secondary | ICD-10-CM

## 2011-12-02 LAB — KAPPA/LAMBDA LIGHT CHAINS: Kappa free light chain: 3.45 mg/dL — ABNORMAL HIGH (ref 0.33–1.94)

## 2011-12-02 MED ORDER — VALACYCLOVIR HCL 500 MG PO TABS: 500.0000 mg | ORAL_TABLET | Freq: Every day | ORAL | Status: DC

## 2011-12-09 ENCOUNTER — Other Ambulatory Visit: Payer: Self-pay | Admitting: *Deleted

## 2011-12-09 DIAGNOSIS — C9000 Multiple myeloma not having achieved remission: Secondary | ICD-10-CM

## 2011-12-09 MED ORDER — LENALIDOMIDE 25 MG PO CAPS: ORAL_CAPSULE | ORAL | Status: DC

## 2011-12-29 ENCOUNTER — Other Ambulatory Visit (HOSPITAL_BASED_OUTPATIENT_CLINIC_OR_DEPARTMENT_OTHER): Payer: 59 | Admitting: Lab

## 2011-12-29 DIAGNOSIS — C9002 Multiple myeloma in relapse: Secondary | ICD-10-CM

## 2011-12-29 LAB — CBC WITH DIFFERENTIAL/PLATELET
Basophils Absolute: 0.1 10*3/uL (ref 0.0–0.1)
Eosinophils Absolute: 0.5 10*3/uL (ref 0.0–0.5)
HGB: 13 g/dL (ref 11.6–15.9)
MCV: 101.7 fL — ABNORMAL HIGH (ref 79.5–101.0)
MONO#: 0.5 10*3/uL (ref 0.1–0.9)
NEUT#: 2.4 10*3/uL (ref 1.5–6.5)
RBC: 3.83 10*6/uL (ref 3.70–5.45)
RDW: 13.9 % (ref 11.2–14.5)
WBC: 4.2 10*3/uL (ref 3.9–10.3)
lymph#: 0.8 10*3/uL — ABNORMAL LOW (ref 0.9–3.3)

## 2011-12-29 LAB — COMPREHENSIVE METABOLIC PANEL (CC13)
ALT: 17 U/L (ref 0–55)
Alkaline Phosphatase: 43 U/L (ref 40–150)
Creatinine: 0.8 mg/dL (ref 0.6–1.1)
Sodium: 140 mEq/L (ref 136–145)
Total Bilirubin: 0.6 mg/dL (ref 0.20–1.20)
Total Protein: 5.4 g/dL — ABNORMAL LOW (ref 6.4–8.3)

## 2011-12-29 LAB — UIFE/LIGHT CHAINS/TP QN, 24-HR UR
Free Lt Chn Excr Rate: 29.82 mg/d
Total Protein, Urine-Ur/day: 42 mg/d (ref 10–140)

## 2011-12-30 LAB — KAPPA/LAMBDA LIGHT CHAINS
Kappa free light chain: 2.33 mg/dL — ABNORMAL HIGH (ref 0.33–1.94)
Lambda Free Lght Chn: 0.28 mg/dL — ABNORMAL LOW (ref 0.57–2.63)

## 2012-01-05 ENCOUNTER — Telehealth: Payer: Self-pay | Admitting: Oncology

## 2012-01-05 ENCOUNTER — Ambulatory Visit (HOSPITAL_BASED_OUTPATIENT_CLINIC_OR_DEPARTMENT_OTHER): Payer: 59 | Admitting: Nurse Practitioner

## 2012-01-05 ENCOUNTER — Ambulatory Visit (HOSPITAL_COMMUNITY)
Admission: RE | Admit: 2012-01-05 | Discharge: 2012-01-05 | Disposition: A | Discharge status: Home / Self Care | Payer: 59 | Source: Ambulatory Visit | Attending: Oncology | Admitting: Oncology

## 2012-01-05 VITALS — BP 105/74 | HR 91 | Temp 97.3°F | Resp 20 | Ht 59.0 in | Wt 130.2 lb

## 2012-01-05 DIAGNOSIS — C9002 Multiple myeloma in relapse: Secondary | ICD-10-CM | POA: Insufficient documentation

## 2012-01-05 DIAGNOSIS — R609 Edema, unspecified: Secondary | ICD-10-CM

## 2012-01-05 DIAGNOSIS — M79609 Pain in unspecified limb: Secondary | ICD-10-CM

## 2012-01-05 DIAGNOSIS — R600 Localized edema: Secondary | ICD-10-CM

## 2012-01-05 DIAGNOSIS — D696 Thrombocytopenia, unspecified: Secondary | ICD-10-CM

## 2012-01-05 NOTE — Progress Notes (Signed)
Bilateral:  No evidence of DVT, superficial thrombosis, or Baker's Cyst.   

## 2012-01-05 NOTE — Progress Notes (Signed)
OFFICE PROGRESS NOTE  Interval history:  Jessica Cochran is a 51 year old woman with relapsed kappa light chain multiple myeloma. She was started on Revlimid 25 mg daily 3 weeks on/1 week off and dexamethasone 40 mg weekly beginning 10/20/2011. The serum free kappa light chain value was 9.03 on 10/10/2011. On 11/12/2011 the value returned at 2.47; on 12/01/2011 the value returned at 3.45; and 12/29/2011 the value returned at 2.33. She is seen today for scheduled followup.  Jessica Cochran feels she is tolerating the Revlimid/dexamethasone well except for progressive fatigue. She denies nausea/vomiting. No mouth sores. No numbness or tingling in her hands or feet. She intermittently has "cramps" in the toes. She denies leg swelling or calf pain. No shortness of breath or chest pain. No diarrhea. She has periodic constipation which she attributes to pain medication. Back pain is stable. She continues OxyContin.   Objective: Blood pressure 105/74, pulse 91, temperature 97.3 F (36.3 C), temperature source Oral, resp. rate 20, height 4\' 11"  (1.499 m), weight 130 lb 3.2 oz (59.058 kg).  Oropharynx is without thrush or ulceration. No palpable cervical, supraclavicular or axillary lymph nodes. Lungs are clear. No wheezes or rales. Regular cardiac rhythm. Abdomen is soft and nontender. No organomegaly. Bilateral pitting edema at the pretibial regions bilaterally right greater than left. Calves are nontender. Motor strength intact  Lab Results: Lab Results  Component Value Date   WBC 4.2 12/29/2011   HGB 13.0 12/29/2011   HCT 38.9 12/29/2011   MCV 101.7* 12/29/2011   PLT 91* 12/29/2011    Chemistry:    Chemistry      Component Value Date/Time   NA 140 12/29/2011 1115   NA 143 10/10/2011 1023   K 3.6 12/29/2011 1115   K 3.8 10/10/2011 1023   CL 105 12/29/2011 1115   CL 105 10/10/2011 1023   CO2 27 12/29/2011 1115   CO2 29 10/10/2011 1023   BUN 12.0 12/29/2011 1115   BUN 16 10/10/2011 1023   CREATININE  0.8 12/29/2011 1115   CREATININE 0.83 10/10/2011 1023   CREATININE 0.75 07/17/2009 1541      Component Value Date/Time   CALCIUM 8.7 12/29/2011 1115   CALCIUM 9.3 10/10/2011 1023   ALKPHOS 43 12/29/2011 1115   ALKPHOS 54 08/19/2011 1353   AST 18 12/29/2011 1115   AST 23 08/19/2011 1353   ALT 17 12/29/2011 1115   ALT 11 08/19/2011 1353   BILITOT 0.60 12/29/2011 1115   BILITOT 0.3 08/19/2011 1353       Studies/Results: No results found.  Medications: I have reviewed the patient's current medications.  Assessment/Plan:  1. Kappa light chain myeloma initially diagnosed May 2009 at which time she presented with severe back pain, hypercalcemia, anemia and proteinuria. She had 16 g of protein in her urine. Initial creatinine of 2. She underwent a lumbar vertebroplasty for a pathologic fracture. She was treated with pulse high dose dexamethasone and subsequently induced into a remission with a combination of Revlimid/Velcade/dexamethasone. She went on to receive high-dose IV melphalan with autologous stem cell support at [facility] on 12/30/2007. Routine survey laboratory studies done 08/19/2011 showed a rise the serum kappa free light chains. Bone marrow aspiration and biopsy on 09/25/2011 showed 4% plasma cells. No large aggregates or sheets of plasma cells. Immunohistochemical staining showed positive polyclonal staining pattern although there was kappa light chain excess "most suggestive of subtle/early involvement by plasma cell dyscrasia". Cytogenetic analysis was normal. She began Revlimid 25 mg daily 3 weeks on/1  week off and dexamethasone 40 mg weekly beginning 10/20/2011. There has been improvement in the serum free kappa light chain level. 2. History of recurrent herpes zoster infections. She will continue prophylactic Valtrex. 3. Status post initial lumbar vertebroplasty for pathologic fracture related to myeloma at diagnosis 4 years ago. She has stable chronic back pain. 4. Mild  thrombocytopenia. She began the 7 day break from Revlimid today. We will repeat a CBC on 01/12/2012 prior to her beginning the next cycle of Revlimid. 5. New asymmetric leg edema right greater than left. We are referring her for bilateral venous Doppler studies.  Disposition-Jessica Cochran appears stable. As noted above, she will return for a CBC on 01/12/2012 to followup the thrombocytopenia prior to beginning the next cycle of Revlimid. She is also being referred for bilateral venous Doppler studies. We will continue to check labs on a 3 week schedule. She will return for a followup visit in 6 weeks. She will contact the office in the interim with any problems.     Lonna Cobb ANP/GNP-BC

## 2012-01-05 NOTE — Telephone Encounter (Signed)
Pt is going for doppler of both extremity today, no precert per Arline Asp at the Vascular lab. Gave pt lab appts with MD visit in December 2013

## 2012-01-06 ENCOUNTER — Other Ambulatory Visit: Payer: Self-pay | Admitting: *Deleted

## 2012-01-06 NOTE — Telephone Encounter (Signed)
THIS REFILL REQUEST FOR REVLIMID WAS PLACED IN DR.GRANFORTUNA'S ACTIVE WORK BOX. 

## 2012-01-07 ENCOUNTER — Other Ambulatory Visit: Payer: Self-pay | Admitting: *Deleted

## 2012-01-07 ENCOUNTER — Other Ambulatory Visit: Payer: Self-pay | Admitting: Nurse Practitioner

## 2012-01-07 DIAGNOSIS — C9000 Multiple myeloma not having achieved remission: Secondary | ICD-10-CM

## 2012-01-07 MED ORDER — LENALIDOMIDE 25 MG PO CAPS: ORAL_CAPSULE | ORAL | Status: DC

## 2012-01-08 ENCOUNTER — Telehealth: Payer: Self-pay | Admitting: *Deleted

## 2012-01-08 NOTE — Telephone Encounter (Signed)
Per staff message and POF I have scheduled appt.  [name]  

## 2012-01-08 NOTE — Telephone Encounter (Signed)
nfusion for TPA to port (pt request 11/7 or 11/8)  Sent email to [facility] to set up patient's treatment  Patient aware of the date and time

## 2012-01-12 ENCOUNTER — Other Ambulatory Visit (HOSPITAL_BASED_OUTPATIENT_CLINIC_OR_DEPARTMENT_OTHER): Payer: 59 | Admitting: Lab

## 2012-01-12 DIAGNOSIS — C9002 Multiple myeloma in relapse: Secondary | ICD-10-CM

## 2012-01-12 LAB — CBC WITH DIFFERENTIAL/PLATELET
Basophils Absolute: 0.1 10*3/uL (ref 0.0–0.1)
EOS%: 6.1 % (ref 0.0–7.0)
Eosinophils Absolute: 0.2 10*3/uL (ref 0.0–0.5)
HGB: 11.9 g/dL (ref 11.6–15.9)
LYMPH%: 33 % (ref 14.0–49.7)
MCH: 33.2 pg (ref 25.1–34.0)
MCV: 98.9 fL (ref 79.5–101.0)
MONO%: 13.1 % (ref 0.0–14.0)
Platelets: 124 10*3/uL — ABNORMAL LOW (ref 145–400)
RBC: 3.58 10*6/uL — ABNORMAL LOW (ref 3.70–5.45)
RDW: 13.7 % (ref 11.2–14.5)

## 2012-01-16 ENCOUNTER — Ambulatory Visit (HOSPITAL_BASED_OUTPATIENT_CLINIC_OR_DEPARTMENT_OTHER): Payer: 59

## 2012-01-16 ENCOUNTER — Other Ambulatory Visit: Payer: 59 | Admitting: Lab

## 2012-01-16 VITALS — BP 110/79 | HR 87 | Temp 97.6°F | Resp 20

## 2012-01-16 DIAGNOSIS — Z452 Encounter for adjustment and management of vascular access device: Secondary | ICD-10-CM

## 2012-01-16 DIAGNOSIS — C9001 Multiple myeloma in remission: Secondary | ICD-10-CM

## 2012-01-16 DIAGNOSIS — C9002 Multiple myeloma in relapse: Secondary | ICD-10-CM

## 2012-01-16 MED ORDER — HEPARIN SOD (PORK) LOCK FLUSH 100 UNIT/ML IV SOLN
500.0000 [IU] | Freq: Once | INTRAVENOUS | Status: AC | PRN
  Administered 2012-01-16: 500 [IU]
  Filled 2012-01-16: qty 5

## 2012-01-16 MED ORDER — SODIUM CHLORIDE 0.9 % IJ SOLN
10.0000 mL | INTRAMUSCULAR | Status: DC | PRN
  Administered 2012-01-16: 10 mL
  Filled 2012-01-16: qty 10

## 2012-01-16 MED ORDER — ALTEPLASE 2 MG IJ SOLR
2.0000 mg | Freq: Once | INTRAMUSCULAR | Status: AC | PRN
  Administered 2012-01-16: 2 mg
  Filled 2012-01-16: qty 2

## 2012-01-16 NOTE — Progress Notes (Signed)
1120 Cathflo initiated to Right PAC 1150 PAC with no blood return. 1220 PAC with no blood return. 1250 PAC with no blood return. 1320 PAC with no blood return. Cathflo removed. PAC flushes good, just no blood return. Dr. Patsy Lager nurse Sabino Snipes RN notified. Dr. Cyndie Chime to decided on further treatment to [facility]. Patient informed and expressed understanding. All questions anwered.

## 2012-01-26 ENCOUNTER — Other Ambulatory Visit (HOSPITAL_BASED_OUTPATIENT_CLINIC_OR_DEPARTMENT_OTHER): Payer: 59

## 2012-01-26 DIAGNOSIS — C9002 Multiple myeloma in relapse: Secondary | ICD-10-CM

## 2012-01-26 LAB — COMPREHENSIVE METABOLIC PANEL (CC13)
Albumin: 3.6 g/dL (ref 3.5–5.0)
Alkaline Phosphatase: 53 U/L (ref 40–150)
BUN: 13 mg/dL (ref 7.0–26.0)
CO2: 30 mEq/L — ABNORMAL HIGH (ref 22–29)
Calcium: 8.4 mg/dL (ref 8.4–10.4)
Chloride: 106 mEq/L (ref 98–107)
Glucose: 92 mg/dl (ref 70–99)
Potassium: 3.4 mEq/L — ABNORMAL LOW (ref 3.5–5.1)
Sodium: 140 mEq/L (ref 136–145)
Total Protein: 5.6 g/dL — ABNORMAL LOW (ref 6.4–8.3)

## 2012-01-26 LAB — CBC WITH DIFFERENTIAL/PLATELET
Basophils Absolute: 0 10*3/uL (ref 0.0–0.1)
Eosinophils Absolute: 0.3 10*3/uL (ref 0.0–0.5)
HGB: 12.3 g/dL (ref 11.6–15.9)
MCV: 102.3 fL — ABNORMAL HIGH (ref 79.5–101.0)
MONO#: 0.3 10*3/uL (ref 0.1–0.9)
MONO%: 7.9 % (ref 0.0–14.0)
NEUT#: 2.5 10*3/uL (ref 1.5–6.5)
RBC: 3.48 10*6/uL — ABNORMAL LOW (ref 3.70–5.45)
RDW: 14.4 % (ref 11.2–14.5)
WBC: 3.7 10*3/uL — ABNORMAL LOW (ref 3.9–10.3)
lymph#: 0.6 10*3/uL — ABNORMAL LOW (ref 0.9–3.3)

## 2012-01-27 LAB — KAPPA/LAMBDA LIGHT CHAINS
Kappa free light chain: 2.17 mg/dL — ABNORMAL HIGH (ref 0.33–1.94)
Lambda Free Lght Chn: 1.1 mg/dL (ref 0.57–2.63)

## 2012-02-03 ENCOUNTER — Other Ambulatory Visit: Payer: Self-pay | Admitting: *Deleted

## 2012-02-03 DIAGNOSIS — C9000 Multiple myeloma not having achieved remission: Secondary | ICD-10-CM

## 2012-02-03 MED ORDER — LENALIDOMIDE 25 MG PO CAPS: ORAL_CAPSULE | ORAL | Status: DC

## 2012-02-03 NOTE — Telephone Encounter (Signed)
THIS REFILL REQUEST FOR REVLIMID WAS PLACED IN DR.GRANFORTUNA'S ACTIVE WORK BOX. 

## 2012-02-09 ENCOUNTER — Other Ambulatory Visit: Payer: 59

## 2012-02-16 ENCOUNTER — Other Ambulatory Visit (HOSPITAL_BASED_OUTPATIENT_CLINIC_OR_DEPARTMENT_OTHER): Payer: 59 | Admitting: Lab

## 2012-02-16 DIAGNOSIS — C9002 Multiple myeloma in relapse: Secondary | ICD-10-CM

## 2012-02-16 LAB — CBC WITH DIFFERENTIAL/PLATELET
Basophils Absolute: 0.1 10*3/uL (ref 0.0–0.1)
EOS%: 7.1 % — ABNORMAL HIGH (ref 0.0–7.0)
Eosinophils Absolute: 0.2 10*3/uL (ref 0.0–0.5)
LYMPH%: 26.5 % (ref 14.0–49.7)
MCH: 34.9 pg — ABNORMAL HIGH (ref 25.1–34.0)
MCV: 103 fL — ABNORMAL HIGH (ref 79.5–101.0)
MONO%: 8.3 % (ref 0.0–14.0)
NEUT#: 1.9 10*3/uL (ref 1.5–6.5)
Platelets: 112 10*3/uL — ABNORMAL LOW (ref 145–400)
RBC: 3.66 10*6/uL — ABNORMAL LOW (ref 3.70–5.45)

## 2012-02-16 LAB — COMPREHENSIVE METABOLIC PANEL (CC13)
AST: 19 U/L (ref 5–34)
Alkaline Phosphatase: 44 U/L (ref 40–150)
BUN: 12 mg/dL (ref 7.0–26.0)
Glucose: 94 mg/dl (ref 70–99)
Sodium: 140 mEq/L (ref 136–145)
Total Bilirubin: 0.7 mg/dL (ref 0.20–1.20)

## 2012-02-17 LAB — KAPPA/LAMBDA LIGHT CHAINS
Kappa free light chain: 2.19 mg/dL — ABNORMAL HIGH (ref 0.33–1.94)
Lambda Free Lght Chn: 0.74 mg/dL (ref 0.57–2.63)

## 2012-02-20 ENCOUNTER — Telehealth: Payer: Self-pay | Admitting: Oncology

## 2012-02-20 ENCOUNTER — Ambulatory Visit (HOSPITAL_BASED_OUTPATIENT_CLINIC_OR_DEPARTMENT_OTHER): Payer: 59 | Admitting: Oncology

## 2012-02-20 VITALS — BP 122/76 | HR 95 | Temp 99.8°F | Resp 18 | Ht 59.0 in | Wt 131.3 lb

## 2012-02-20 DIAGNOSIS — M8448XA Pathological fracture, other site, initial encounter for fracture: Secondary | ICD-10-CM

## 2012-02-20 DIAGNOSIS — B029 Zoster without complications: Secondary | ICD-10-CM

## 2012-02-20 DIAGNOSIS — C9002 Multiple myeloma in relapse: Secondary | ICD-10-CM

## 2012-02-20 DIAGNOSIS — J209 Acute bronchitis, unspecified: Secondary | ICD-10-CM

## 2012-02-20 MED ORDER — SODIUM CHLORIDE 0.9 % IJ SOLN
10.0000 mL | INTRAMUSCULAR | Status: DC | PRN
  Administered 2012-02-20: 10 mL via INTRAVENOUS
  Filled 2012-02-20: qty 10

## 2012-02-20 MED ORDER — HEPARIN SOD (PORK) LOCK FLUSH 100 UNIT/ML IV SOLN
500.0000 [IU] | Freq: Once | INTRAVENOUS | Status: AC
  Administered 2012-02-20: 500 [IU] via INTRAVENOUS
  Filled 2012-02-20: qty 5

## 2012-02-20 NOTE — Patient Instructions (Signed)
Lab and 24 hour urine on 110/14 Visit with NP, Misty Stanley on 04/02/12 Finish current Revlimid at same dose For next prescription, we will change to 10 mg every day Continue steroids at current dose

## 2012-02-20 NOTE — Addendum Note (Signed)
Addended by: Sabino Snipes on: 02/20/2012 02:40 PM   Modules accepted: Orders, SmartSet

## 2012-02-20 NOTE — Progress Notes (Signed)
Hematology and Oncology Follow Up Visit  Jessica Cochran [id number] [birth year] 51 y.o. 02/20/2012 2:13 PM   Principle Diagnosis: Encounter Diagnosis  Name Primary?  . Multiple myeloma in relapse Yes     Interim History:   Followup visit for this 51 year old woman with kappa light chain multiple myeloma diagnosed in May of 2009 when she presented with severe back pain, hypercalcemia, anemia, and proteinuria. She had 16 g of protein in her urine. Initial creatinine of 2. She required a lumbar vertebroplasty for a pathologic fracture. She was treated with pulse high dose dexamethasone in the hospital and subsequently induced into a remission with a combination of Revlimid, Velcade, and dexamethasone. She went on to receive high-dose IV melphalan with autologous stem cell support at [facility] in [place] on 12/30/2007. She was not put on any maintenance therapy. She initially received Zometa monthly and is now on a q. 6 monthly schedule. She continues to have significant back pain requiring chronic narcotic analgesics. However overall she is doing extremely well.  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 She is currently on Valtrex prophylaxis 500 mg daily.  Routine survey laboratory studies done on 08/19/2011 showed a rise in her kappa serum free light chains from 1.79 recorded on February 12 to 6.97 on June 11. Kappa to lambda ratio 7.04. This prompted additional testing. Repeat kappa free light chains to assess reproducibility done on June 18 showed value of 6.75 mg percent. With repeat value of 9.03 mg% on 10/10/11.  24 urine collection done July 3 showed  157 mg total protein in a volume of 1300 mL compared with 60 mg of protein on February 12 study. Urine was positive for monoclonal free kappa light chains on immunofixation electrophoresis.  A skeletal bone survey showed no change in chronic bone lesions.  I did a bone marrow aspiration and biopsy on July 18. which showed 4% plasma cells. No large aggregates or sheets of plasma cells. Immunohistochemical staining showed positive polyclonal staining pattern although there was kappa light chain excess "most suggestive of subtle/early involvement by plasma cell dyscrasia". Cytogenetic studies did not show any bad prognosis chromosome changes. After discussion with her transplant physicians at [facility], I elected to put her back on treatment with Revlimid 25 mg 3 weeks on, one-week off plus dexamethasone 40 mg by mouth weekly beginning 10/20/2011. Overall she has tolerated the Revlimid well except for fatigue. She has not developed any constipation or skin rash. She has had only mild myelosuppression primarily affecting the platelets which have gone down but no lower than 91,000. She has a mild leukopenia as well.  Kappa free light chains have fallen from peak value of 9.03 mg percent to 2.17 as of November 18 and remained stable at 2.19 as of 02/16/12. Repeat 24-hour urine protein done October 17 shows fall in total 24-hour protein from 157 mg on July 3 down to 42 mg. With no monoclonal free kappa light chains on IFE.  She contracted a viral bronchitis this week. No fevers. No flareups of her herpes zoster infections. No change in her chronic back pain still requiring intermittent long-acting narcotics. No new areas of pain.         Medications: reviewed  Allergies: No Known Allergies  Review of Systems: Constitutional:   No constitutional symptoms Respiratory: Active bronchitis Cardiovascular:  No chest pain or palpitations Gastrointestinal: No abdominal pain, change in bowel  habit Genito-Urinary: No urinary tract symptoms Musculoskeletal: See above Neurologic: No headache or change in vision Skin: No rash or ecchymosis Remaining ROS negative.  Physical Exam: Blood pressure 122/76, pulse 95, temperature 99.8 F (37.7 C), temperature source Oral, resp. rate 18, height 4\' 11"  (1.499 m), weight 131 lb 4.8 oz (59.557 kg). Wt Readings from Last 3 Encounters:  02/20/12 131 lb 4.8 oz (59.557 kg)  01/05/12 130 lb 3.2 oz (59.058 kg)  11/17/11 129 lb 6.4 oz (58.695 kg)     General appearance: Thin Caucasian woman HENNT: Pharynx no erythema or exudate Lymph nodes: No adenopathy Breasts: Lungs: Clear to auscultation resonant to percussion Heart: Regular rhythm no murmur Abdomen: Soft, nontender, no mass, no organomegaly Extremities: No edema, no calf tenderness Vascular: No cyanosis Neurologic: Motor strength 5 over 5, reflexes trace at the knees 1+ at the biceps Skin: No rash or ecchymosis  Lab Results: Lab Results  Component Value Date   WBC 3.4* 02/16/2012   HGB 12.8 02/16/2012   HCT 37.7 02/16/2012   MCV 103.0* 02/16/2012   PLT 112* 02/16/2012     Chemistry      Component Value Date/Time   NA 140 02/16/2012 1357   NA 143 10/10/2011 1023   K 3.9 02/16/2012 1357   K 3.8 10/10/2011 1023   CL 105 02/16/2012 1357   CL 105 10/10/2011 1023   CO2 27 02/16/2012 1357   CO2 29 10/10/2011 1023   BUN 12.0 02/16/2012 1357   BUN 16 10/10/2011 1023   CREATININE 0.8 02/16/2012 1357   CREATININE 0.83 10/10/2011 1023   CREATININE 0.75 07/17/2009 1541      Component Value Date/Time   CALCIUM 8.8 02/16/2012 1357   CALCIUM 9.3 10/10/2011 1023   ALKPHOS 44 02/16/2012 1357   ALKPHOS 54 08/19/2011 1353   AST 19 02/16/2012 1357   AST 23 08/19/2011 1353   ALT 19 02/16/2012 1357   ALT 11 08/19/2011 1353   BILITOT 0.70 02/16/2012 1357   BILITOT 0.3 08/19/2011 1353     kappa free light chains 2.19 mg percent 02/16/2012    Radiological Studies: No results found.  Impression and  Plan: #1.Early relapse of kappa light chain myeloma She appears to have had a nice response to Revlimid dexamethasone. She had a recent followup visit at [facility]. They suggested that we could decrease her Revlimid down to a maintenance dose. She will complete current cycle in one week. When she resumes treatment next month I will prescribe 10 mg of Revlimid on a daily basis without any break and continue the dexamethasone 40 mg weekly.  #2. History of recurrent herpes zoster infections. She continues prophylactic Valtrex.  #3. Chronic back pain due to initial pathologic fracture of lumbar spine at time of diagnosis status post vertebroplasty.  #4. Acute bronchitis. Likely viral and contracted from her husband.   CC:. Dr. Laurann Montana; Dr. Radene Ou   Levert Feinstein, MD 12/13/20132:13 PM

## 2012-02-20 NOTE — Telephone Encounter (Signed)
Gave pt appt for lab and ML on January 2014

## 2012-03-02 ENCOUNTER — Other Ambulatory Visit: Payer: Self-pay | Admitting: *Deleted

## 2012-03-02 DIAGNOSIS — C9000 Multiple myeloma not having achieved remission: Secondary | ICD-10-CM

## 2012-03-02 MED ORDER — LENALIDOMIDE 10 MG PO CAPS: 10.0000 mg | ORAL_CAPSULE | Freq: Every day | ORAL | Status: DC

## 2012-03-12 ENCOUNTER — Telehealth: Payer: Self-pay | Admitting: Oncology

## 2012-03-12 NOTE — Telephone Encounter (Signed)
Talk to patient and she is aware of appt on January 2014

## 2012-03-19 ENCOUNTER — Other Ambulatory Visit (HOSPITAL_BASED_OUTPATIENT_CLINIC_OR_DEPARTMENT_OTHER): Payer: 59 | Admitting: Lab

## 2012-03-19 ENCOUNTER — Other Ambulatory Visit: Payer: 59

## 2012-03-19 ENCOUNTER — Ambulatory Visit (HOSPITAL_BASED_OUTPATIENT_CLINIC_OR_DEPARTMENT_OTHER): Payer: 59

## 2012-03-19 VITALS — BP 123/72 | HR 88 | Temp 98.4°F

## 2012-03-19 DIAGNOSIS — C9000 Multiple myeloma not having achieved remission: Secondary | ICD-10-CM

## 2012-03-19 DIAGNOSIS — C9002 Multiple myeloma in relapse: Secondary | ICD-10-CM

## 2012-03-19 LAB — CBC WITH DIFFERENTIAL/PLATELET
BASO%: 1.2 % (ref 0.0–2.0)
Basophils Absolute: 0.1 10*3/uL (ref 0.0–0.1)
EOS%: 7 % (ref 0.0–7.0)
HCT: 32.1 % — ABNORMAL LOW (ref 34.8–46.6)
HGB: 11.3 g/dL — ABNORMAL LOW (ref 11.6–15.9)
MCH: 35.4 pg — ABNORMAL HIGH (ref 25.1–34.0)
MCHC: 35.1 g/dL (ref 31.5–36.0)
MONO#: 0.5 10*3/uL (ref 0.1–0.9)
NEUT%: 52.4 % (ref 38.4–76.8)
RDW: 15 % — ABNORMAL HIGH (ref 11.2–14.5)
WBC: 4.2 10*3/uL (ref 3.9–10.3)
lymph#: 1.1 10*3/uL (ref 0.9–3.3)

## 2012-03-19 LAB — COMPREHENSIVE METABOLIC PANEL (CC13)
ALT: 11 U/L (ref 0–55)
AST: 16 U/L (ref 5–34)
Albumin: 3.4 g/dL — ABNORMAL LOW (ref 3.5–5.0)
Alkaline Phosphatase: 49 U/L (ref 40–150)
Calcium: 8.3 mg/dL — ABNORMAL LOW (ref 8.4–10.4)
Chloride: 106 mEq/L (ref 98–107)
Potassium: 3.6 mEq/L (ref 3.5–5.1)
Sodium: 139 mEq/L (ref 136–145)
Total Protein: 5.6 g/dL — ABNORMAL LOW (ref 6.4–8.3)

## 2012-03-19 MED ORDER — SODIUM CHLORIDE 0.9 % IJ SOLN
10.0000 mL | INTRAMUSCULAR | Status: DC | PRN
  Administered 2012-03-19: 10 mL via INTRAVENOUS
  Filled 2012-03-19: qty 10

## 2012-03-19 MED ORDER — HEPARIN SOD (PORK) LOCK FLUSH 100 UNIT/ML IV SOLN
500.0000 [IU] | Freq: Once | INTRAVENOUS | Status: AC
  Administered 2012-03-19: 500 [IU] via INTRAVENOUS
  Filled 2012-03-19: qty 5

## 2012-03-19 NOTE — Patient Instructions (Signed)
Call MD for problems 

## 2012-03-22 ENCOUNTER — Telehealth: Payer: Self-pay | Admitting: *Deleted

## 2012-03-22 NOTE — Telephone Encounter (Signed)
Message copied by Sabino Snipes on Mon Mar 22, 2012  5:10 PM ------      Message from: Levert Feinstein      Created: Mon Mar 22, 2012  2:14 PM       Call pt antibody levels good - now back in normal range

## 2012-03-22 NOTE — Telephone Encounter (Signed)
Message left on pt home vm regarding antibody levels with message to call back if questions.

## 2012-03-23 LAB — KAPPA/LAMBDA LIGHT CHAINS: Lambda Free Lght Chn: 0.62 mg/dL (ref 0.57–2.63)

## 2012-03-23 LAB — IMMUNOFIXATION ELECTROPHORESIS
IgA: 21 mg/dL — ABNORMAL LOW (ref 69–380)
IgG (Immunoglobin G), Serum: 310 mg/dL — ABNORMAL LOW (ref 690–1700)
IgM, Serum: 20 mg/dL — ABNORMAL LOW (ref 52–322)

## 2012-03-30 ENCOUNTER — Other Ambulatory Visit: Payer: Self-pay | Admitting: *Deleted

## 2012-03-30 MED ORDER — PANTOPRAZOLE SODIUM 40 MG PO TBEC: 40.0000 mg | DELAYED_RELEASE_TABLET | Freq: Every day | ORAL | Status: DC | PRN

## 2012-03-31 ENCOUNTER — Telehealth: Payer: Self-pay | Admitting: Oncology

## 2012-03-31 NOTE — Telephone Encounter (Signed)
Pt called and gave her appt for lab before MD visit on 04/02/12

## 2012-04-02 ENCOUNTER — Ambulatory Visit (HOSPITAL_BASED_OUTPATIENT_CLINIC_OR_DEPARTMENT_OTHER): Payer: 59 | Admitting: Lab

## 2012-04-02 ENCOUNTER — Other Ambulatory Visit: Payer: Self-pay | Admitting: *Deleted

## 2012-04-02 ENCOUNTER — Other Ambulatory Visit (HOSPITAL_BASED_OUTPATIENT_CLINIC_OR_DEPARTMENT_OTHER): Payer: 59 | Admitting: Lab

## 2012-04-02 ENCOUNTER — Ambulatory Visit (HOSPITAL_BASED_OUTPATIENT_CLINIC_OR_DEPARTMENT_OTHER): Payer: 59 | Admitting: Nurse Practitioner

## 2012-04-02 ENCOUNTER — Telehealth: Payer: Self-pay | Admitting: *Deleted

## 2012-04-02 VITALS — BP 117/77 | HR 104 | Temp 98.6°F | Resp 20 | Ht 59.0 in | Wt 133.8 lb

## 2012-04-02 DIAGNOSIS — C9002 Multiple myeloma in relapse: Secondary | ICD-10-CM

## 2012-04-02 DIAGNOSIS — C9000 Multiple myeloma not having achieved remission: Secondary | ICD-10-CM

## 2012-04-02 DIAGNOSIS — R609 Edema, unspecified: Secondary | ICD-10-CM

## 2012-04-02 DIAGNOSIS — D696 Thrombocytopenia, unspecified: Secondary | ICD-10-CM

## 2012-04-02 LAB — UIFE/LIGHT CHAINS/TP QN, 24-HR UR
Albumin, U: DETECTED
Alpha 1, Urine: DETECTED — AB
Alpha 2, Urine: DETECTED — AB
Free Kappa/Lambda Ratio: 41.5 ratio — ABNORMAL HIGH (ref 2.04–10.37)
Free Lambda Excretion/Day: 0.68 mg/d
Time: 24 hours
Total Protein, Urine: 2.4 mg/dL

## 2012-04-02 LAB — CBC WITH DIFFERENTIAL/PLATELET
BASO%: 1.3 % (ref 0.0–2.0)
EOS%: 8.5 % — ABNORMAL HIGH (ref 0.0–7.0)
HCT: 35.1 % (ref 34.8–46.6)
HGB: 12 g/dL (ref 11.6–15.9)
MCH: 35.2 pg — ABNORMAL HIGH (ref 25.1–34.0)
MCHC: 34.2 g/dL (ref 31.5–36.0)
MCV: 103 fL — ABNORMAL HIGH (ref 79.5–101.0)
MONO%: 17.2 % — ABNORMAL HIGH (ref 0.0–14.0)
NEUT#: 1.5 10*3/uL (ref 1.5–6.5)
RBC: 3.41 10*6/uL — ABNORMAL LOW (ref 3.70–5.45)
RDW: 15 % — ABNORMAL HIGH (ref 11.2–14.5)
WBC: 3.6 10*3/uL — ABNORMAL LOW (ref 3.9–10.3)
lymph#: 1.1 10*3/uL (ref 0.9–3.3)

## 2012-04-02 MED ORDER — LENALIDOMIDE 10 MG PO CAPS: 10.0000 mg | ORAL_CAPSULE | Freq: Every day | ORAL | Status: DC

## 2012-04-02 NOTE — Progress Notes (Signed)
OFFICE PROGRESS NOTE  Interval history:  Jessica Cochran is a 52 year old woman with a relapsed kappa light chain multiple myeloma. She was started back on treatment with Revlimid 25 mg 3 weeks on/1 week off plus dexamethasone 40 mg weekly beginning 10/20/2011. The Revlimid dose was adjusted to a maintenance dose of 10 mg daily with continuation of dexamethasone 40 mg weekly beginning approximately 03/07/2012.  Serum free kappa light chains returned at 1.22 mg with a kappa lambda ratio 1.97 on 03/19/2012 as compared to 2.19 and 2.96 on 02/16/2012. Results of a 24-hour urine are pending.  She notes significant improvement in the fatigue she was experiencing since decreasing the Revlimid dose. She feels well. No interim illnesses or infections. She denies any nausea/vomiting. No mouth sores. No diarrhea. She is intermittently mildly constipated. No numbness or tingling in her hands or feet. Back pain is stable. She continues OxyContin. She has a good appetite. She denies shortness of breath. No chest pain. No leg swelling or calf pain.   Objective: Blood pressure 117/77, pulse 104, temperature 98.6 F (37 C), temperature source Oral, resp. rate 20, height 4\' 11"  (1.499 m), weight 133 lb 12.8 oz (60.691 kg).  Oropharynx is without thrush or ulceration. No palpable cervical or supraclavicular lymph nodes. Lungs are clear. Regular cardiac rhythm. Port-A-Cath site is without erythema. Abdomen is soft and nontender. No organomegaly. Extremities are without edema. Calves are soft and nontender. Motor strength 5 over 5. Vibratory sense mildly decreased at the fingertips per tuning fork exam.  Lab Results: Lab Results  Component Value Date   WBC 4.2 03/19/2012   HGB 11.3* 03/19/2012   HCT 32.1* 03/19/2012   MCV 100.8 03/19/2012   PLT 99* 03/19/2012    Chemistry:    Chemistry      Component Value Date/Time   NA 139 03/19/2012 1102   NA 143 10/10/2011 1023   K 3.6 03/19/2012 1102   K 3.8 10/10/2011 1023   CL  106 03/19/2012 1102   CL 105 10/10/2011 1023   CO2 27 03/19/2012 1102   CO2 29 10/10/2011 1023   BUN 13.0 03/19/2012 1102   BUN 16 10/10/2011 1023   CREATININE 0.8 03/19/2012 1102   CREATININE 0.83 10/10/2011 1023   CREATININE 0.75 07/17/2009 1541      Component Value Date/Time   CALCIUM 8.3* 03/19/2012 1102   CALCIUM 9.3 10/10/2011 1023   ALKPHOS 49 03/19/2012 1102   ALKPHOS 54 08/19/2011 1353   AST 16 03/19/2012 1102   AST 23 08/19/2011 1353   ALT 11 03/19/2012 1102   ALT 11 08/19/2011 1353   BILITOT 0.53 03/19/2012 1102   BILITOT 0.3 08/19/2011 1353       Studies/Results: No results found.  Medications: I have reviewed the patient's current medications.  Assessment/Plan:  1. Kappa light chain myeloma initially diagnosed May 2009 at which time she presented with severe back pain, hypercalcemia, anemia and proteinuria. She had 16 g of protein in her urine. Initial creatinine of 2. She underwent a lumbar vertebroplasty for a pathologic fracture. She was treated with pulse high dose dexamethasone and subsequently induced into a remission with a combination of Revlimid/Velcade/dexamethasone. She went on to receive high-dose IV melphalan with autologous stem cell support at [facility] on 12/30/2007. Routine survey laboratory studies done 08/19/2011 showed a rise the serum kappa free light chains. Bone marrow aspiration and biopsy on 09/25/2011 showed 4% plasma cells. No large aggregates or sheets of plasma cells. Immunohistochemical staining showed positive polyclonal  staining pattern although there was kappa light chain excess "most suggestive of subtle/early involvement by plasma cell dyscrasia". Cytogenetic analysis was normal. She began Revlimid 25 mg daily 3 weeks on/1 week off and dexamethasone 40 mg weekly beginning 10/20/2011 with a good response. She began a maintenance dose of Revlimid 10 mg daily with continuation of dexamethasone 40 mg weekly around 03/07/2012. 2. History of recurrent herpes  zoster infections. She  continues prophylactic Valtrex. 3. Status post initial lumbar vertebroplasty for pathologic fracture related to myeloma at diagnosis 4 years ago. She has stable chronic back pain. 4. Mild thrombocytopenia. Stable. 5. New asymmetric leg edema right greater than left noted 01/05/2012. Bilateral lower extremity venous Doppler negative for DVT.  Disposition-[name] appears stable. Recent restaging myeloma labs showed continued improvement. She will continue Revlimid 10 mg daily and dexamethasone 40 mg weekly. She will return for labs, Zometa and a port flush on 04/30/2012. She will return for a followup visit with Dr. Cyndie Chime on 05/14/2012. She will contact the office in the interim with any problems.  Jessica Cochran ANP/GNP-BC    CC Dr. Laurann Montana and Dr. Radene Ou

## 2012-04-02 NOTE — Telephone Encounter (Signed)
Called patient and left VM to call [employer] and do her survey.

## 2012-04-02 NOTE — Telephone Encounter (Signed)
Per staff message and POF I have scheduled appts.  [name]  

## 2012-04-07 ENCOUNTER — Other Ambulatory Visit: Payer: Self-pay | Admitting: *Deleted

## 2012-04-07 DIAGNOSIS — C9000 Multiple myeloma not having achieved remission: Secondary | ICD-10-CM

## 2012-04-07 MED ORDER — VALACYCLOVIR HCL 500 MG PO TABS: 500.0000 mg | ORAL_TABLET | Freq: Every day | ORAL | Status: DC

## 2012-04-28 ENCOUNTER — Other Ambulatory Visit: Payer: Self-pay | Admitting: *Deleted

## 2012-04-28 DIAGNOSIS — C9000 Multiple myeloma not having achieved remission: Secondary | ICD-10-CM

## 2012-04-28 MED ORDER — LENALIDOMIDE 10 MG PO CAPS: 10.0000 mg | ORAL_CAPSULE | Freq: Every day | ORAL | Status: DC

## 2012-04-30 ENCOUNTER — Other Ambulatory Visit (HOSPITAL_BASED_OUTPATIENT_CLINIC_OR_DEPARTMENT_OTHER): Payer: 59 | Admitting: Lab

## 2012-04-30 ENCOUNTER — Ambulatory Visit (HOSPITAL_BASED_OUTPATIENT_CLINIC_OR_DEPARTMENT_OTHER): Payer: 59

## 2012-04-30 VITALS — BP 108/75 | HR 72 | Temp 98.3°F

## 2012-04-30 DIAGNOSIS — C9002 Multiple myeloma in relapse: Secondary | ICD-10-CM

## 2012-04-30 DIAGNOSIS — C9000 Multiple myeloma not having achieved remission: Secondary | ICD-10-CM

## 2012-04-30 DIAGNOSIS — C9001 Multiple myeloma in remission: Secondary | ICD-10-CM

## 2012-04-30 LAB — COMPREHENSIVE METABOLIC PANEL (CC13)
ALT: 13 U/L (ref 0–55)
Albumin: 3.4 g/dL — ABNORMAL LOW (ref 3.5–5.0)
Alkaline Phosphatase: 40 U/L (ref 40–150)
CO2: 30 mEq/L — ABNORMAL HIGH (ref 22–29)
Potassium: 3.6 mEq/L (ref 3.5–5.1)
Sodium: 140 mEq/L (ref 136–145)
Total Bilirubin: 0.52 mg/dL (ref 0.20–1.20)
Total Protein: 5.3 g/dL — ABNORMAL LOW (ref 6.4–8.3)

## 2012-04-30 LAB — CBC WITH DIFFERENTIAL/PLATELET
Eosinophils Absolute: 0.3 10*3/uL (ref 0.0–0.5)
MCV: 101.4 fL — ABNORMAL HIGH (ref 79.5–101.0)
MONO#: 0.5 10*3/uL (ref 0.1–0.9)
MONO%: 14.7 % — ABNORMAL HIGH (ref 0.0–14.0)
NEUT#: 1.5 10*3/uL (ref 1.5–6.5)
RBC: 3.46 10*6/uL — ABNORMAL LOW (ref 3.70–5.45)
RDW: 13.7 % (ref 11.2–14.5)
WBC: 3.4 10*3/uL — ABNORMAL LOW (ref 3.9–10.3)
lymph#: 1 10*3/uL (ref 0.9–3.3)
nRBC: 0 % (ref 0–0)

## 2012-04-30 LAB — LACTATE DEHYDROGENASE (CC13): LDH: 202 U/L (ref 125–245)

## 2012-04-30 MED ORDER — ALTEPLASE 2 MG IJ SOLR
2.0000 mg | Freq: Once | INTRAMUSCULAR | Status: DC | PRN
  Filled 2012-04-30: qty 2

## 2012-04-30 MED ORDER — ZOLEDRONIC ACID 4 MG/100ML IV SOLN
4.0000 mg | Freq: Once | INTRAVENOUS | Status: AC
Start: 2012-04-30 — End: 2012-04-30
  Administered 2012-04-30: 4 mg via INTRAVENOUS
  Filled 2012-04-30: qty 100

## 2012-04-30 MED ORDER — SODIUM CHLORIDE 0.9 % IJ SOLN
10.0000 mL | INTRAMUSCULAR | Status: DC | PRN
  Administered 2012-04-30: 10 mL
  Filled 2012-04-30: qty 10

## 2012-04-30 MED ORDER — SODIUM CHLORIDE 0.9 % IV SOLN
Freq: Once | INTRAVENOUS | Status: AC
  Administered 2012-04-30: 12:00:00 via INTRAVENOUS

## 2012-04-30 MED ORDER — SODIUM CHLORIDE 0.9 % IJ SOLN
3.0000 mL | Freq: Once | INTRAMUSCULAR | Status: DC | PRN
  Filled 2012-04-30: qty 10

## 2012-04-30 MED ORDER — HEPARIN SOD (PORK) LOCK FLUSH 100 UNIT/ML IV SOLN
500.0000 [IU] | Freq: Once | INTRAVENOUS | Status: AC | PRN
  Administered 2012-04-30: 500 [IU]
  Filled 2012-04-30: qty 5

## 2012-04-30 NOTE — Patient Instructions (Signed)
Zoledronic Acid injection (Hypercalcemia, Oncology) What is this medicine? ZOLEDRONIC ACID (ZOE le dron ik AS id) lowers the amount of calcium loss from bone. It is used to treat too much calcium in your blood from cancer. It is also used to prevent complications of cancer that has spread to the bone. This medicine may be used for other purposes; ask your health care provider or pharmacist if you have questions. What should I tell my health care provider before I take this medicine? They need to know if you have any of these conditions: -aspirin-sensitive asthma -dental disease -kidney disease -an unusual or allergic reaction to zoledronic acid, other medicines, foods, dyes, or preservatives -pregnant or trying to get pregnant -breast-feeding How should I use this medicine? This medicine is for infusion into a vein. It is given by a health care professional in a hospital or clinic setting. Talk to your pediatrician regarding the use of this medicine in children. Special care may be needed. Overdosage: If you think you have taken too much of this medicine contact a poison control center or emergency room at once. NOTE: This medicine is only for you. Do not share this medicine with others. What if I miss a dose? It is important not to miss your dose. Call your doctor or health care professional if you are unable to keep an appointment. What may interact with this medicine? -certain antibiotics given by injection -NSAIDs, medicines for pain and inflammation, like ibuprofen or naproxen -some diuretics like bumetanide, furosemide -teriparatide -thalidomide This list may not describe all possible interactions. Give your health care provider a list of all the medicines, herbs, non-prescription drugs, or dietary supplements you use. Also tell them if you smoke, drink alcohol, or use illegal drugs. Some items may interact with your medicine. What should I watch for while using this medicine? Visit  your doctor or health care professional for regular checkups. It may be some time before you see the benefit from this medicine. Do not stop taking your medicine unless your doctor tells you to. Your doctor may order blood tests or other tests to see how you are doing. Women should inform their doctor if they wish to become pregnant or think they might be pregnant. There is a potential for serious side effects to an unborn child. Talk to your health care professional or pharmacist for more information. You should make sure that you get enough calcium and vitamin D while you are taking this medicine. Discuss the foods you eat and the vitamins you take with your health care professional. Some people who take this medicine have severe bone, joint, and/or muscle pain. This medicine may also increase your risk for a broken thigh bone. Tell your doctor right away if you have pain in your upper leg or groin. Tell your doctor if you have any pain that does not go away or that gets worse. What side effects may I notice from receiving this medicine? Side effects that you should report to your doctor or health care professional as soon as possible: -allergic reactions like skin rash, itching or hives, swelling of the face, lips, or tongue -anxiety, confusion, or depression -breathing problems -changes in vision -feeling faint or lightheaded, falls -jaw burning, cramping, pain -muscle cramps, stiffness, or weakness -trouble passing urine or change in the amount of urine Side effects that usually do not require medical attention (report to your doctor or health care professional if they continue or are bothersome): -bone, joint, or muscle pain -  fever -hair loss -irritation at site where injected -loss of appetite -nausea, vomiting -stomach upset -tired This list may not describe all possible side effects. Call your doctor for medical advice about side effects. You may report side effects to FDA at  1-800-FDA-1088. Where should I keep my medicine? This drug is given in a hospital or clinic and will not be stored at home. NOTE: This sheet is a summary. It may not cover all possible information. If you have questions about this medicine, talk to your doctor, pharmacist, or health care provider.  2013, [employer]. (08/23/2010 9:06:58 AM)  

## 2012-05-04 LAB — KAPPA/LAMBDA LIGHT CHAINS: Lambda Free Lght Chn: 0.61 mg/dL (ref 0.57–2.63)

## 2012-05-04 LAB — IMMUNOFIXATION ELECTROPHORESIS: IgA: 17 mg/dL — ABNORMAL LOW (ref 69–380)

## 2012-05-05 ENCOUNTER — Telehealth: Payer: Self-pay | Admitting: *Deleted

## 2012-05-05 NOTE — Telephone Encounter (Signed)
Unable to reach pt. At home.  Called mobile number and spoke with husband, Baldo Ash.  Asked him to let pt. Know that myeloma protein levels are normal. He appreciated the call.

## 2012-05-05 NOTE — Telephone Encounter (Signed)
Message copied by Orbie Hurst on Wed May 05, 2012 10:04 AM ------      Message from: Levert Feinstein      Created: Tue May 04, 2012  7:38 AM       Call pt - myeloma protein levels normal ------

## 2012-05-14 ENCOUNTER — Ambulatory Visit: Payer: 59 | Admitting: Oncology

## 2012-05-18 ENCOUNTER — Telehealth: Payer: Self-pay | Admitting: *Deleted

## 2012-05-18 ENCOUNTER — Other Ambulatory Visit: Payer: Self-pay | Admitting: *Deleted

## 2012-05-18 NOTE — Telephone Encounter (Signed)
Pt called to r/s missed appt on fri 04/16/12.  She states she called on fri to r/s but was told that no one was there & someone would call her.  Will discuss with Dr Cyndie Chime.

## 2012-05-19 ENCOUNTER — Telehealth: Payer: Self-pay | Admitting: *Deleted

## 2012-05-19 ENCOUNTER — Other Ambulatory Visit: Payer: Self-pay | Admitting: Oncology

## 2012-05-19 DIAGNOSIS — C9002 Multiple myeloma in relapse: Secondary | ICD-10-CM

## 2012-05-19 NOTE — Telephone Encounter (Signed)
sw pt appt d/t was given for 05/26/2012@ 11:45 am

## 2012-05-20 ENCOUNTER — Telehealth: Payer: Self-pay | Admitting: Oncology

## 2012-05-20 NOTE — Telephone Encounter (Signed)
Talked to pt husband, appt for lab and MD for 3/18

## 2012-05-25 ENCOUNTER — Other Ambulatory Visit: Payer: Self-pay | Admitting: *Deleted

## 2012-05-25 DIAGNOSIS — C9000 Multiple myeloma not having achieved remission: Secondary | ICD-10-CM

## 2012-05-25 MED ORDER — LENALIDOMIDE 10 MG PO CAPS: 10.0000 mg | ORAL_CAPSULE | Freq: Every day | ORAL | Status: DC

## 2012-05-25 NOTE — Telephone Encounter (Signed)
THIS REFILL REQUEST FOR REVLIMID WAS PLACED IN DR.GRANFORTUNA'S ACTIVE WORK BOX. 

## 2012-05-26 ENCOUNTER — Ambulatory Visit (HOSPITAL_BASED_OUTPATIENT_CLINIC_OR_DEPARTMENT_OTHER): Payer: 59 | Admitting: Nurse Practitioner

## 2012-05-26 ENCOUNTER — Other Ambulatory Visit: Payer: Self-pay | Admitting: Oncology

## 2012-05-26 ENCOUNTER — Ambulatory Visit: Payer: 59 | Admitting: Lab

## 2012-05-26 ENCOUNTER — Other Ambulatory Visit (HOSPITAL_BASED_OUTPATIENT_CLINIC_OR_DEPARTMENT_OTHER): Payer: 59 | Admitting: Lab

## 2012-05-26 ENCOUNTER — Other Ambulatory Visit: Payer: Self-pay | Admitting: Nurse Practitioner

## 2012-05-26 ENCOUNTER — Telehealth: Payer: Self-pay | Admitting: Oncology

## 2012-05-26 VITALS — BP 122/81 | HR 103 | Temp 98.3°F | Resp 18 | Ht 59.0 in | Wt 129.7 lb

## 2012-05-26 DIAGNOSIS — D696 Thrombocytopenia, unspecified: Secondary | ICD-10-CM

## 2012-05-26 DIAGNOSIS — M549 Dorsalgia, unspecified: Secondary | ICD-10-CM

## 2012-05-26 DIAGNOSIS — R609 Edema, unspecified: Secondary | ICD-10-CM

## 2012-05-26 DIAGNOSIS — C9000 Multiple myeloma not having achieved remission: Secondary | ICD-10-CM

## 2012-05-26 DIAGNOSIS — C9002 Multiple myeloma in relapse: Secondary | ICD-10-CM

## 2012-05-26 LAB — COMPREHENSIVE METABOLIC PANEL (CC13)
AST: 18 U/L (ref 5–34)
Albumin: 3.8 g/dL (ref 3.5–5.0)
BUN: 15.4 mg/dL (ref 7.0–26.0)
CO2: 27 mEq/L (ref 22–29)
Calcium: 8.7 mg/dL (ref 8.4–10.4)
Chloride: 105 mEq/L (ref 98–107)
Creatinine: 0.8 mg/dL (ref 0.6–1.1)
Glucose: 76 mg/dl (ref 70–99)
Potassium: 3.1 mEq/L — ABNORMAL LOW (ref 3.5–5.1)

## 2012-05-26 LAB — CBC WITH DIFFERENTIAL/PLATELET
Basophils Absolute: 0.1 10*3/uL (ref 0.0–0.1)
EOS%: 4.9 % (ref 0.0–7.0)
HGB: 12.1 g/dL (ref 11.6–15.9)
MCH: 35.4 pg — ABNORMAL HIGH (ref 25.1–34.0)
MCV: 102.6 fL — ABNORMAL HIGH (ref 79.5–101.0)
MONO%: 24 % — ABNORMAL HIGH (ref 0.0–14.0)
NEUT#: 1.7 10*3/uL (ref 1.5–6.5)
RBC: 3.42 10*6/uL — ABNORMAL LOW (ref 3.70–5.45)
RDW: 14.2 % (ref 11.2–14.5)
lymph#: 1.1 10*3/uL (ref 0.9–3.3)

## 2012-05-26 NOTE — Progress Notes (Signed)
OFFICE PROGRESS NOTE  Interval history:   Ms. Jessica Cochran is a 52 year old woman with relapsed kappa light chain multiple myeloma. She was started back on treatment with Revlimid 25 mg 3 weeks on/1 week off plus dexamethasone 40 mg weekly beginning 10/20/2011. The Revlimid dose was adjusted to a maintenance dose of 10 mg daily with continuation of dexamethasone 40 mg weekly beginning approximately 03/07/2012. Serum free kappa light chains returned at 1.22 mg with a kappa lambda ratio 1.97 on 03/19/2012 as compared to 2.19 and 2.96 on 02/16/2012. 24-hour urine 03/31/2012 showed 41 mg of protein excretion as compared to 42 mg on 12/25/2011 and 157 mg on 09/10/2011. Serum free kappa light chains returned at 0.72 on 04/30/2012, lambda free light chains 0.61 and kappa lambda ratio 1.18.  Last Zometa infusion was on 04/30/2012.  She overall is feeling well. No interim illnesses or infections. She notes less fatigue with the lower dose of Revlimid. She has occasional mild nausea. No vomiting. No mouth sores. Bowels moving regularly. No diarrhea. Stable chronic back pain. She continues OxyContin. No numbness or tingling in her hands or feet.   Objective: Blood pressure 122/81, pulse 103, temperature 98.3 F (36.8 C), temperature source Oral, resp. rate 18, height 4\' 11"  (1.499 m), weight 129 lb 11.2 oz (58.832 kg).  Oropharynx is without thrush or ulceration. No palpable cervical or supraclavicular lymph nodes. Lungs are clear. Regular cardiac rhythm. Port-A-Cath site is without erythema. Abdomen is soft and nontender. No organomegaly. Extremities are without edema. Calves are soft and nontender. Motor strength 5 over 5. Vibratory sense mildly decreased over the fingertips per tuning fork exam. Back is kyphotic.  Lab Results: Lab Results  Component Value Date   WBC 3.4* 04/30/2012   HGB 11.9 04/30/2012   HCT 35.1 04/30/2012   MCV 101.4* 04/30/2012   PLT 104* 04/30/2012    Chemistry:    Chemistry       Component Value Date/Time   NA 140 04/30/2012 1057   NA 143 10/10/2011 1023   K 3.6 04/30/2012 1057   K 3.8 10/10/2011 1023   CL 105 04/30/2012 1057   CL 105 10/10/2011 1023   CO2 30* 04/30/2012 1057   CO2 29 10/10/2011 1023   BUN 7.1 04/30/2012 1057   BUN 16 10/10/2011 1023   CREATININE 0.8 04/30/2012 1057   CREATININE 0.83 10/10/2011 1023   CREATININE 0.75 07/17/2009 1541      Component Value Date/Time   CALCIUM 8.4 04/30/2012 1057   CALCIUM 9.3 10/10/2011 1023   ALKPHOS 40 04/30/2012 1057   ALKPHOS 54 08/19/2011 1353   AST 17 04/30/2012 1057   AST 23 08/19/2011 1353   ALT 13 04/30/2012 1057   ALT 11 08/19/2011 1353   BILITOT 0.52 04/30/2012 1057   BILITOT 0.3 08/19/2011 1353       Studies/Results: No results found.  Medications: I have reviewed the patient's current medications.  Assessment/Plan:  1. Kappa light chain myeloma initially diagnosed May 2009 at which time she presented with severe back pain, hypercalcemia, anemia and proteinuria. She had 16 g of protein in her urine. Initial creatinine of 2. She underwent a lumbar vertebroplasty for a pathologic fracture. She was treated with pulse high dose dexamethasone and subsequently induced into a remission with a combination of Revlimid/Velcade/dexamethasone. She went on to receive high-dose IV melphalan with autologous stem cell support at [facility] on 12/30/2007. Routine survey laboratory studies done 08/19/2011 showed a rise the serum kappa free light chains. Bone marrow  aspiration and biopsy on 09/25/2011 showed 4% plasma cells. No large aggregates or sheets of plasma cells. Immunohistochemical staining showed positive polyclonal staining pattern although there was kappa light chain excess "most suggestive of subtle/early involvement by plasma cell dyscrasia". Cytogenetic analysis was normal. She began Revlimid 25 mg daily 3 weeks on/1 week off and dexamethasone 40 mg weekly beginning 10/20/2011 with a good response. She began a maintenance  dose of Revlimid 10 mg daily with continuation of dexamethasone 40 mg weekly around 03/07/2012. 2. History of recurrent herpes zoster infections. She continues prophylactic Valtrex. 3. Status post initial lumbar vertebroplasty for pathologic fracture related to myeloma at diagnosis 4 years ago. She has stable chronic back pain. 4. Mild thrombocytopenia. Stable. 5. New asymmetric leg edema right greater than left noted 01/05/2012. Bilateral lower extremity venous Doppler negative for DVT.  Disposition-Ms. Shanker appears stable. Myeloma labs 04/30/2012 showed continued improvement. She will continue Revlimid/dexamethasone at the current dose/schedule. She is due to return for a Port-A-Cath flush on 06/11/2012. She has a lab appointment 06/30/2012 and a followup visit with Dr. Cyndie Chime on 07/12/2012. She will contact the office in the interim with any problems.  Lonna Cobb ANP/GNP-BC

## 2012-05-26 NOTE — Telephone Encounter (Signed)
Gave pt appt for lab,and MD , pt sent to labs today

## 2012-05-27 ENCOUNTER — Telehealth: Payer: Self-pay | Admitting: Oncology

## 2012-05-27 LAB — KAPPA/LAMBDA LIGHT CHAINS
Kappa free light chain: 0.8 mg/dL (ref 0.33–1.94)
Kappa:Lambda Ratio: 1 (ref 0.26–1.65)
Lambda Free Lght Chn: 0.8 mg/dL (ref 0.57–2.63)

## 2012-05-27 NOTE — Telephone Encounter (Signed)
Called pt and left message regarding lab and flush on April and advise pt to get calendar for April and May 2014

## 2012-06-11 ENCOUNTER — Ambulatory Visit: Payer: 59

## 2012-06-11 ENCOUNTER — Other Ambulatory Visit (HOSPITAL_BASED_OUTPATIENT_CLINIC_OR_DEPARTMENT_OTHER): Payer: 59 | Admitting: Lab

## 2012-06-11 VITALS — BP 124/88 | HR 96 | Temp 98.0°F | Resp 20

## 2012-06-11 DIAGNOSIS — C9000 Multiple myeloma not having achieved remission: Secondary | ICD-10-CM

## 2012-06-11 LAB — CBC WITH DIFFERENTIAL/PLATELET
BASO%: 2.6 % — ABNORMAL HIGH (ref 0.0–2.0)
Basophils Absolute: 0.1 10*3/uL (ref 0.0–0.1)
EOS%: 8.9 % — ABNORMAL HIGH (ref 0.0–7.0)
HCT: 35.3 % (ref 34.8–46.6)
HGB: 11.9 g/dL (ref 11.6–15.9)
MCH: 34.3 pg — ABNORMAL HIGH (ref 25.1–34.0)
MONO#: 0.5 10*3/uL (ref 0.1–0.9)
RDW: 13.3 % (ref 11.2–14.5)
WBC: 3.1 10*3/uL — ABNORMAL LOW (ref 3.9–10.3)
lymph#: 1 10*3/uL (ref 0.9–3.3)

## 2012-06-11 MED ORDER — HEPARIN SOD (PORK) LOCK FLUSH 100 UNIT/ML IV SOLN
500.0000 [IU] | Freq: Once | INTRAVENOUS | Status: AC
  Administered 2012-06-11: 500 [IU] via INTRAVENOUS
  Filled 2012-06-11: qty 5

## 2012-06-11 MED ORDER — SODIUM CHLORIDE 0.9 % IJ SOLN
10.0000 mL | INTRAMUSCULAR | Status: DC | PRN
  Administered 2012-06-11: 10 mL via INTRAVENOUS
  Filled 2012-06-11: qty 10

## 2012-06-11 NOTE — Patient Instructions (Signed)
You had your port flushed today. Continue to have flushed every 6-8 weeks. 

## 2012-06-13 ENCOUNTER — Other Ambulatory Visit: Payer: Self-pay | Admitting: Oncology

## 2012-06-13 DIAGNOSIS — C9002 Multiple myeloma in relapse: Secondary | ICD-10-CM

## 2012-06-16 ENCOUNTER — Telehealth: Payer: Self-pay | Admitting: Oncology

## 2012-06-16 ENCOUNTER — Telehealth: Payer: Self-pay | Admitting: *Deleted

## 2012-06-16 NOTE — Telephone Encounter (Signed)
Per staff message and POF I have scheduled appts.  [name]  

## 2012-06-16 NOTE — Telephone Encounter (Signed)
Called pt and left message for appt for April and May 2014 , advised pt to get new appt calendar

## 2012-06-22 ENCOUNTER — Other Ambulatory Visit: Payer: Self-pay | Admitting: *Deleted

## 2012-06-22 DIAGNOSIS — C9000 Multiple myeloma not having achieved remission: Secondary | ICD-10-CM

## 2012-06-22 MED ORDER — LENALIDOMIDE 10 MG PO CAPS: 10.0000 mg | ORAL_CAPSULE | Freq: Every day | ORAL | Status: DC

## 2012-06-30 ENCOUNTER — Other Ambulatory Visit (HOSPITAL_BASED_OUTPATIENT_CLINIC_OR_DEPARTMENT_OTHER): Payer: 59 | Admitting: Lab

## 2012-06-30 DIAGNOSIS — C9002 Multiple myeloma in relapse: Secondary | ICD-10-CM

## 2012-06-30 LAB — COMPREHENSIVE METABOLIC PANEL (CC13)
Albumin: 3.7 g/dL (ref 3.5–5.0)
CO2: 27 mEq/L (ref 22–29)
Calcium: 8.5 mg/dL (ref 8.4–10.4)
Chloride: 104 mEq/L (ref 98–107)
Glucose: 85 mg/dl (ref 70–99)
Potassium: 3.3 mEq/L — ABNORMAL LOW (ref 3.5–5.1)
Sodium: 141 mEq/L (ref 136–145)
Total Protein: 5.9 g/dL — ABNORMAL LOW (ref 6.4–8.3)

## 2012-06-30 LAB — CBC WITH DIFFERENTIAL/PLATELET
Eosinophils Absolute: 0.2 10*3/uL (ref 0.0–0.5)
HGB: 12.2 g/dL (ref 11.6–15.9)
MONO#: 0.8 10*3/uL (ref 0.1–0.9)
NEUT#: 1.7 10*3/uL (ref 1.5–6.5)
RBC: 3.5 10*6/uL — ABNORMAL LOW (ref 3.70–5.45)
RDW: 14.1 % (ref 11.2–14.5)
WBC: 4 10*3/uL (ref 3.9–10.3)

## 2012-07-02 LAB — UIFE/LIGHT CHAINS/TP QN, 24-HR UR
Alpha 1, Urine: DETECTED — AB
Alpha 2, Urine: DETECTED — AB
Beta, Urine: DETECTED — AB
Free Kappa Lt Chains,Ur: 1.03 mg/dL (ref 0.14–2.42)
Free Kappa/Lambda Ratio: 12.88 ratio — ABNORMAL HIGH (ref 2.04–10.37)
Free Lt Chn Excr Rate: 17.51 mg/d
Gamma Globulin, Urine: DETECTED — AB
Total Protein, Urine: 1.8 mg/dL

## 2012-07-12 ENCOUNTER — Telehealth: Payer: Self-pay | Admitting: Oncology

## 2012-07-12 ENCOUNTER — Ambulatory Visit (HOSPITAL_BASED_OUTPATIENT_CLINIC_OR_DEPARTMENT_OTHER): Payer: 59 | Admitting: Oncology

## 2012-07-12 DIAGNOSIS — C9002 Multiple myeloma in relapse: Secondary | ICD-10-CM

## 2012-07-12 DIAGNOSIS — M549 Dorsalgia, unspecified: Secondary | ICD-10-CM

## 2012-07-12 DIAGNOSIS — G8929 Other chronic pain: Secondary | ICD-10-CM

## 2012-07-12 NOTE — Telephone Encounter (Signed)
Gave pt appt for May, June and August 2014 labs, ML and chemo

## 2012-07-13 NOTE — Progress Notes (Signed)
Hematology and Oncology Follow Up Visit  Jessica Cochran [id number] Sep 11, 1960 52 y.o. 07/13/2012 9:43 PM   Principle Diagnosis: Encounter Diagnoses  Name Primary?  . Multiple myeloma in relapse Yes  . Chronic back pain      Interim History:   Followup visit for this 52 year old woman initially diagnosed with HA multiple myeloma in May 2009 when she presented with back pain, anemia, hypercalcemia, and proteinuria. Please see my summary note dated 02/20/2012 for full details. Briefly, she received induction therapy with RVD and achieved an excellent response. She went on to receive high-dose IV melphalan with autologous stem cell support in October 2009. She received monthly Zometa for the first 2 years and then changed to an every 6 month schedule. She showed signs of early progression in June 2013 with rising kappa free light chains in the serum and appearance of monoclonal proteins in the urine. Bone marrow aspirate and biopsy done 09/25/2011 showed only 4% plasma cells. Negative cytogenetic studies. I put her back on treatment with a combination of Revlimid 25 mg 3 weeks on one-week off plus dexamethasone 40 mg by mouth weekly beginning in August 2013. She has had a nice response with steady fall in her paraprotein levels in both serum and urine. Most recent studies done March 19 showed normal levels of both kappa and lambda serum free light chains with a normal ratio of 1.0. 24-hour urine collection done April 23 shows only 31 mg total protein although free kappa light chains are still detectable on immunofixation electrophoresis. Her hematologic profile has remained stable with hemoglobin of 12 g, white count 4000, with chronic, borderline decreased platelet count currently at 107,000. Revlimid decreased to 10 mg on a daily basis at that time.  She continues to take narcotic analgesics for chronic back pain related to the initial pathologic fracture. She required an initial  vertebroplasty. She's had no interim fever or infection. Due to recurrent herpes zoster infection she is on chronic Valtrex prophylaxis.  Medications: reviewed  Allergies: No Known Allergies  Review of Systems: Constitutional:   No constitutional symptoms Respiratory: No cough or dyspnea Cardiovascular:  No chest pain or palpitations Gastrointestinal: No abdominal pain or change in bowel habits Genito-Urinary: No urinary tract symptoms Musculoskeletal: No new areas of bone pain Neurologic: No headache or change in vision Skin: No rash or ecchymosis Remaining ROS negative.  Physical Exam: There were no vitals taken for this visit. Wt Readings from Last 3 Encounters:  05/26/12 129 lb 11.2 oz (58.832 kg)  04/02/12 133 lb 12.8 oz (60.691 kg)  02/20/12 131 lb 4.8 oz (59.557 kg)     General appearance: Thin Caucasian woman HENNT: Pharynx no erythema, exudate, or ulcer Lymph nodes: No adenopathy Breasts: Lungs: Clear to auscultation resonant to percussion Heart: Regular rhythm no murmur Abdomen: Soft, nontender, no mass, no organomegaly Extremities: No edema, no calf tenderness Musculoskeletal:  GU: Vascular: No cyanosis Neurologic: No focal deficit. Vibration sensation intact over the fingertips Skin: No rash or ecchymosis  Lab Results: Lab Results  Component Value Date   WBC 4.0 06/30/2012   HGB 12.2 06/30/2012   HCT 36.0 06/30/2012   MCV 103.1* 06/30/2012   PLT 107* 06/30/2012     Chemistry      Component Value Date/Time   NA 141 06/30/2012 1536   NA 143 10/10/2011 1023   K 3.3* 06/30/2012 1536   K 3.8 10/10/2011 1023   CL 104 06/30/2012 1536   CL 105 10/10/2011 1023   CO2  27 06/30/2012 1536   CO2 29 10/10/2011 1023   BUN 13.9 06/30/2012 1536   BUN 16 10/10/2011 1023   CREATININE 0.8 06/30/2012 1536   CREATININE 0.83 10/10/2011 1023   CREATININE 0.75 07/17/2009 1541      Component Value Date/Time   CALCIUM 8.5 06/30/2012 1536   CALCIUM 9.3 10/10/2011 1023   ALKPHOS 38* 06/30/2012  1536   ALKPHOS 54 08/19/2011 1353   AST 16 06/30/2012 1536   AST 23 08/19/2011 1353   ALT 13 06/30/2012 1536   ALT 11 08/19/2011 1353   BILITOT 0.58 06/30/2012 1536   BILITOT 0.3 08/19/2011 1353       Impression: #1. Relapsed kappa light chain myeloma responding to current therapy. She is currently receiving Revlimid 10 mg daily and Decadron 40 mg weekly along with Zometa every 6 months. Plan: Continue the same  #2. Recurrent herpes asked her infection. Stable on prophylactic Valtrex. No recent flareups.  #3. Chronic back pain due to initial pathologic fracture of lumbar spine requiring vertebroplasty. She is due for followup skeletal survey and I will order this now.   CC:. Dr. Laurann Montana; Dr. Leafy Ro   Levert Feinstein, MD 5/6/20149:43 PM

## 2012-07-14 ENCOUNTER — Ambulatory Visit (HOSPITAL_COMMUNITY)
Admission: RE | Admit: 2012-07-14 | Discharge: 2012-07-14 | Disposition: A | Discharge status: Home / Self Care | Payer: 59 | Source: Ambulatory Visit | Attending: Oncology | Admitting: Oncology

## 2012-07-14 DIAGNOSIS — M8448XA Pathological fracture, other site, initial encounter for fracture: Secondary | ICD-10-CM | POA: Insufficient documentation

## 2012-07-14 DIAGNOSIS — C9000 Multiple myeloma not having achieved remission: Secondary | ICD-10-CM | POA: Insufficient documentation

## 2012-07-14 DIAGNOSIS — C9002 Multiple myeloma in relapse: Secondary | ICD-10-CM

## 2012-07-15 ENCOUNTER — Telehealth: Payer: Self-pay | Admitting: Oncology

## 2012-07-15 NOTE — Telephone Encounter (Signed)
Called pt and left message, advise pt to get calendar for  june 2014 labs, chemo and  ML

## 2012-07-16 ENCOUNTER — Telehealth: Payer: Self-pay | Admitting: *Deleted

## 2012-07-16 NOTE — Telephone Encounter (Signed)
Message copied by Sabino Snipes on Fri Jul 16, 2012  3:52 PM ------      Message from: Levert Feinstein      Created: Wed Jul 14, 2012  1:30 PM       Call pt - bone Xrays good no areas of concern ------

## 2012-07-16 NOTE — Telephone Encounter (Signed)
Informed pt's husband of good bone x-ray results per Dr Cyndie Chime.

## 2012-07-23 ENCOUNTER — Other Ambulatory Visit: Payer: Self-pay | Admitting: *Deleted

## 2012-07-23 DIAGNOSIS — C9000 Multiple myeloma not having achieved remission: Secondary | ICD-10-CM

## 2012-07-23 MED ORDER — LENALIDOMIDE 10 MG PO CAPS: 10.0000 mg | ORAL_CAPSULE | Freq: Every day | ORAL | Status: DC

## 2012-07-28 ENCOUNTER — Other Ambulatory Visit: Payer: 59 | Admitting: Lab

## 2012-07-28 ENCOUNTER — Other Ambulatory Visit (HOSPITAL_COMMUNITY): Payer: Self-pay | Admitting: Oncology

## 2012-07-28 ENCOUNTER — Other Ambulatory Visit (HOSPITAL_BASED_OUTPATIENT_CLINIC_OR_DEPARTMENT_OTHER): Payer: 59 | Admitting: Lab

## 2012-07-28 ENCOUNTER — Ambulatory Visit (HOSPITAL_BASED_OUTPATIENT_CLINIC_OR_DEPARTMENT_OTHER): Payer: 59

## 2012-07-28 VITALS — BP 137/78 | HR 94 | Temp 98.1°F | Resp 20

## 2012-07-28 DIAGNOSIS — C9002 Multiple myeloma in relapse: Secondary | ICD-10-CM

## 2012-07-28 LAB — COMPREHENSIVE METABOLIC PANEL (CC13)
Albumin: 3.6 g/dL (ref 3.5–5.0)
CO2: 28 mEq/L (ref 22–29)
Chloride: 106 mEq/L (ref 98–107)
Glucose: 82 mg/dl (ref 70–99)
Potassium: 3.1 mEq/L — ABNORMAL LOW (ref 3.5–5.1)
Sodium: 142 mEq/L (ref 136–145)
Total Protein: 5.6 g/dL — ABNORMAL LOW (ref 6.4–8.3)

## 2012-07-28 LAB — CBC WITH DIFFERENTIAL/PLATELET
Eosinophils Absolute: 0.1 10*3/uL (ref 0.0–0.5)
MCV: 102.7 fL — ABNORMAL HIGH (ref 79.5–101.0)
MONO#: 0.7 10*3/uL (ref 0.1–0.9)
MONO%: 18.8 % — ABNORMAL HIGH (ref 0.0–14.0)
NEUT#: 1.7 10*3/uL (ref 1.5–6.5)
RBC: 3.43 10*6/uL — ABNORMAL LOW (ref 3.70–5.45)
RDW: 14.1 % (ref 11.2–14.5)
WBC: 3.9 10*3/uL (ref 3.9–10.3)

## 2012-07-28 MED ORDER — SODIUM CHLORIDE 0.9 % IJ SOLN
10.0000 mL | INTRAMUSCULAR | Status: DC | PRN
  Administered 2012-07-28: 10 mL via INTRAVENOUS
  Filled 2012-07-28: qty 10

## 2012-07-28 MED ORDER — HEPARIN SOD (PORK) LOCK FLUSH 100 UNIT/ML IV SOLN
500.0000 [IU] | Freq: Once | INTRAVENOUS | Status: AC
  Administered 2012-07-28: 500 [IU] via INTRAVENOUS
  Filled 2012-07-28: qty 5

## 2012-07-28 NOTE — Patient Instructions (Signed)
Call MF for problems or concerns

## 2012-07-29 ENCOUNTER — Telehealth: Payer: Self-pay | Admitting: *Deleted

## 2012-07-29 ENCOUNTER — Other Ambulatory Visit: Payer: Self-pay | Admitting: *Deleted

## 2012-07-29 DIAGNOSIS — C9002 Multiple myeloma in relapse: Secondary | ICD-10-CM

## 2012-07-29 LAB — KAPPA/LAMBDA LIGHT CHAINS
Kappa free light chain: 0.83 mg/dL (ref 0.33–1.94)
Lambda Free Lght Chn: 0.76 mg/dL (ref 0.57–2.63)

## 2012-07-29 MED ORDER — POTASSIUM CHLORIDE CRYS ER 20 MEQ PO TBCR: EXTENDED_RELEASE_TABLET | ORAL | Status: DC

## 2012-07-29 NOTE — Telephone Encounter (Signed)
Called patient.  Let her know her K+ is low. To start on KDUR- already called in to pharmacy.  Also discussed K+ in diet.  She knows schedulers will call her for lab appt. To recheck K+ in one week.

## 2012-07-29 NOTE — Telephone Encounter (Signed)
Message copied by Orbie Hurst on Thu Jul 29, 2012  1:30 PM ------      Message from: Levert Feinstein      Created: Wed Jul 28, 2012  6:42 PM       Call pt - potassium low.  Start Kdur 20 meq PO BID x 1 week then once daily # 60 refill x 4      Bmet in 1 week ------

## 2012-08-03 ENCOUNTER — Telehealth: Payer: Self-pay | Admitting: Oncology

## 2012-08-04 ENCOUNTER — Other Ambulatory Visit: Payer: 59 | Admitting: Lab

## 2012-08-04 ENCOUNTER — Other Ambulatory Visit: Payer: 59

## 2012-08-04 ENCOUNTER — Telehealth: Payer: Self-pay | Admitting: Oncology

## 2012-08-04 NOTE — Telephone Encounter (Signed)
Pt called and left messsage regarding cancelling lab due to Pot[place], refered the call to nurse, pt notified

## 2012-08-05 ENCOUNTER — Other Ambulatory Visit (HOSPITAL_BASED_OUTPATIENT_CLINIC_OR_DEPARTMENT_OTHER): Payer: 59 | Admitting: Lab

## 2012-08-05 DIAGNOSIS — C9002 Multiple myeloma in relapse: Secondary | ICD-10-CM

## 2012-08-05 LAB — BASIC METABOLIC PANEL (CC13)
Calcium: 8.9 mg/dL (ref 8.4–10.4)
Potassium: 4 mEq/L (ref 3.5–5.1)
Sodium: 142 mEq/L (ref 136–145)

## 2012-08-17 ENCOUNTER — Other Ambulatory Visit: Payer: Self-pay | Admitting: *Deleted

## 2012-08-17 DIAGNOSIS — C9000 Multiple myeloma not having achieved remission: Secondary | ICD-10-CM

## 2012-08-17 MED ORDER — LENALIDOMIDE 10 MG PO CAPS: 10.0000 mg | ORAL_CAPSULE | Freq: Every day | ORAL | Status: DC

## 2012-08-27 ENCOUNTER — Other Ambulatory Visit: Payer: Self-pay | Admitting: *Deleted

## 2012-08-27 DIAGNOSIS — C9000 Multiple myeloma not having achieved remission: Secondary | ICD-10-CM

## 2012-08-27 MED ORDER — VALACYCLOVIR HCL 500 MG PO TABS: 500.0000 mg | ORAL_TABLET | Freq: Every day | ORAL | Status: DC

## 2012-08-30 ENCOUNTER — Telehealth: Payer: Self-pay | Admitting: Oncology

## 2012-08-30 ENCOUNTER — Ambulatory Visit (HOSPITAL_BASED_OUTPATIENT_CLINIC_OR_DEPARTMENT_OTHER): Payer: 59 | Admitting: Nurse Practitioner

## 2012-08-30 ENCOUNTER — Ambulatory Visit (HOSPITAL_BASED_OUTPATIENT_CLINIC_OR_DEPARTMENT_OTHER): Payer: 59

## 2012-08-30 ENCOUNTER — Telehealth: Payer: Self-pay | Admitting: *Deleted

## 2012-08-30 ENCOUNTER — Ambulatory Visit: Payer: 59 | Admitting: Nurse Practitioner

## 2012-08-30 ENCOUNTER — Other Ambulatory Visit (HOSPITAL_BASED_OUTPATIENT_CLINIC_OR_DEPARTMENT_OTHER): Payer: 59 | Admitting: Lab

## 2012-08-30 ENCOUNTER — Ambulatory Visit: Payer: 59

## 2012-08-30 VITALS — BP 133/73 | HR 75 | Temp 98.7°F | Resp 20 | Ht 59.0 in | Wt 126.6 lb

## 2012-08-30 DIAGNOSIS — Z452 Encounter for adjustment and management of vascular access device: Secondary | ICD-10-CM

## 2012-08-30 DIAGNOSIS — C9000 Multiple myeloma not having achieved remission: Secondary | ICD-10-CM

## 2012-08-30 DIAGNOSIS — C9002 Multiple myeloma in relapse: Secondary | ICD-10-CM

## 2012-08-30 LAB — COMPREHENSIVE METABOLIC PANEL (CC13)
ALT: 14 U/L (ref 0–55)
CO2: 25 mEq/L (ref 22–29)
Calcium: 8.4 mg/dL (ref 8.4–10.4)
Chloride: 107 mEq/L (ref 98–107)
Sodium: 139 mEq/L (ref 136–145)
Total Protein: 5.7 g/dL — ABNORMAL LOW (ref 6.4–8.3)

## 2012-08-30 LAB — CBC WITH DIFFERENTIAL/PLATELET
BASO%: 1.4 % (ref 0.0–2.0)
HCT: 34.7 % — ABNORMAL LOW (ref 34.8–46.6)
MCHC: 34.7 g/dL (ref 31.5–36.0)
MONO#: 0.2 10*3/uL (ref 0.1–0.9)
NEUT#: 2.7 10*3/uL (ref 1.5–6.5)
NEUT%: 73.3 % (ref 38.4–76.8)
RBC: 3.36 10*6/uL — ABNORMAL LOW (ref 3.70–5.45)
WBC: 3.7 10*3/uL — ABNORMAL LOW (ref 3.9–10.3)
lymph#: 0.6 10*3/uL — ABNORMAL LOW (ref 0.9–3.3)

## 2012-08-30 MED ORDER — SODIUM CHLORIDE 0.9 % IJ SOLN
10.0000 mL | INTRAMUSCULAR | Status: DC | PRN
  Administered 2012-08-30: 10 mL via INTRAVENOUS
  Filled 2012-08-30: qty 10

## 2012-08-30 MED ORDER — HEPARIN SOD (PORK) LOCK FLUSH 100 UNIT/ML IV SOLN
500.0000 [IU] | Freq: Once | INTRAVENOUS | Status: AC
  Administered 2012-08-30: 500 [IU] via INTRAVENOUS
  Filled 2012-08-30: qty 5

## 2012-08-30 NOTE — Progress Notes (Signed)
OFFICE PROGRESS NOTE  Interval history:   Jessica Cochran is a 52 year old woman with relapsed kappa light chain multiple myeloma. She was started back on treatment with Revlimid 25 mg 3 weeks on/1 week off plus dexamethasone 40 mg weekly beginning 10/20/2011. The Revlimid dose was adjusted to a maintenance dose of 10 mg daily with continuation of dexamethasone 40 mg weekly beginning approximately 03/07/2012. Serum free kappa light chains returned at 1.22 mg with a kappa lambda ratio 1.97 on 03/19/2012 as compared to 2.19 and 2.96 on 02/16/2012. 24-hour urine 03/31/2012 showed 41 mg of protein excretion as compared to 42 mg on 12/25/2011 and 157 mg on 09/10/2011. Serum free kappa light chains returned at 0.72 on 04/30/2012, lambda free light chains 0.61 and kappa lambda ratio 1.18. Most recent light chain analysis on 07/28/2012 showed kappa free light chains 0.83, lambda free light chains 0.76 and ratio 1.09. Last Zometa infusion was on 04/30/2012.  She is seen today for scheduled followup. She is overall feeling well. She notes intermittent fatigue which she relates to the Revlimid. No nausea or vomiting. No mouth sores. She has occasional mild tingling in the fingertips. Stable chronic back pain. She continues OxyContin with oxycodone as needed. No shortness of breath. No leg swelling or calf pain.   Objective: Blood pressure 133/73, pulse 75, temperature 98.7 F (37.1 C), temperature source Oral, resp. rate 20, height 4\' 11"  (1.499 m), weight 126 lb 9.6 oz (57.425 kg).  Oropharynx is without thrush or ulceration. Lungs are clear. Regular cardiac rhythm. Abdomen is soft and nontender. No organomegaly. Extremities are without edema. Calves soft and nontender. Motor strength 5 over 5. Knee DTRs absent symmetrically. Vibratory sense intact over the fingertips bilaterally per tuning fork exam.  Lab Results: Lab Results  Component Value Date   WBC 3.7* 08/30/2012   HGB 12.0 08/30/2012   HCT 34.7*  08/30/2012   MCV 103.2* 08/30/2012   PLT 96* 08/30/2012    Chemistry:    Chemistry      Component Value Date/Time   NA 142 08/05/2012 1319   NA 143 10/10/2011 1023   K 4.0 08/05/2012 1319   K 3.8 10/10/2011 1023   CL 104 08/05/2012 1319   CL 105 10/10/2011 1023   CO2 30* 08/05/2012 1319   CO2 29 10/10/2011 1023   BUN 12.5 08/05/2012 1319   BUN 16 10/10/2011 1023   CREATININE 0.9 08/05/2012 1319   CREATININE 0.83 10/10/2011 1023   CREATININE 0.75 07/17/2009 1541      Component Value Date/Time   CALCIUM 8.9 08/05/2012 1319   CALCIUM 9.3 10/10/2011 1023   ALKPHOS 35* 07/28/2012 1130   ALKPHOS 54 08/19/2011 1353   AST 33 07/28/2012 1130   AST 23 08/19/2011 1353   ALT 45 07/28/2012 1130   ALT 11 08/19/2011 1353   BILITOT 0.70 07/28/2012 1130   BILITOT 0.3 08/19/2011 1353       Studies/Results: No results found.  Medications: I have reviewed the patient's current medications.  Assessment/Plan:  1. Kappa light chain myeloma initially diagnosed May 2009 at which time she presented with severe back pain, hypercalcemia, anemia and proteinuria. She had 16 g of protein in her urine. Initial creatinine of 2. She underwent a lumbar vertebroplasty for a pathologic fracture. She was treated with pulse high dose dexamethasone and subsequently induced into a remission with a combination of Revlimid/Velcade/dexamethasone. She went on to receive high-dose IV melphalan with autologous stem cell support at [facility] on 12/30/2007. Routine survey  laboratory studies done 08/19/2011 showed a rise the serum kappa free light chains. Bone marrow aspiration and biopsy on 09/25/2011 showed 4% plasma cells. No large aggregates or sheets of plasma cells. Immunohistochemical staining showed positive polyclonal staining pattern although there was kappa light chain excess "most suggestive of subtle/early involvement by plasma cell dyscrasia". Cytogenetic analysis was normal. She began Revlimid 25 mg daily 3 weeks on/1 week off and  dexamethasone 40 mg weekly beginning 10/20/2011 with a good response. She began a maintenance dose of Revlimid 10 mg daily with continuation of dexamethasone 40 mg weekly around 03/07/2012. 2. History of recurrent herpes zoster infections. She continues prophylactic Valtrex. 3. Status post initial lumbar vertebroplasty for pathologic fracture related to myeloma at diagnosis 4 years ago. She has stable chronic back pain. 4. Mild thrombocytopenia. Stable. 5. New asymmetric leg edema right greater than left noted 01/05/2012. Bilateral lower extremity venous Doppler negative for DVT.  Disposition-Jessica Cochran appears stable. Plan to continue Revlimid 10 mg daily and dexamethasone 40 mg weekly. She receives Zometa every 6 months with the last infusion on 04/30/2012. Port-A-Cath was flushed today. We will schedule the next flush at a 6 week interval. Plan to continue monthly labs. She will return for a followup visit in 8 weeks. She will contact the office in the interim with any problems.  Lonna Cobb ANP/GNP-BC

## 2012-08-30 NOTE — Patient Instructions (Addendum)
Call MD for problems 

## 2012-08-30 NOTE — Telephone Encounter (Signed)
Gave pt appt for lab and flush on July and see Md with labs And Zometa on 10/29/12

## 2012-08-30 NOTE — Telephone Encounter (Signed)
Per staff phone call and POF I have adjusted appt.  [name]

## 2012-08-31 ENCOUNTER — Other Ambulatory Visit: Payer: Self-pay | Admitting: *Deleted

## 2012-08-31 ENCOUNTER — Telehealth: Payer: Self-pay | Admitting: *Deleted

## 2012-08-31 NOTE — Telephone Encounter (Signed)
Appt moved to August 2014 lab, flush and Zometa per pt rqst 2014, called pt left message

## 2012-08-31 NOTE — Telephone Encounter (Signed)
Per staff phone call and POF I have schedueld appts.  [name]  

## 2012-08-31 NOTE — Telephone Encounter (Signed)
Pt called asking to speak to Lonna Cobb NP.  She was seen yest by her.  She states that labs were scheduled for July 31 & her husband reminded her that Dr Cyndie Chime said we were going to go to every other month labs.  She wants to know if we can cancel 7/31 labs.  She also reports that she is scheduled for 10/29/12 lab/flush/MD visit/zometa & she doesn't want to do all of this in one day.  She would like to keep MD appt & move lab/flush/infusion appt to @ 8/13 or 14 @ 10 or 10:30am  Discussed with Misty Stanley & deferred to Dr Cyndie Chime regarding labs & whether monthly or every other month.

## 2012-09-01 ENCOUNTER — Telehealth: Payer: Self-pay | Admitting: *Deleted

## 2012-09-01 NOTE — Telephone Encounter (Signed)
Late Entry:  Pt notified yest that Dr. Cyndie Chime OK with every other month labs.  She was informed of her potassium level & to cont. KCL as she has been taking.

## 2012-09-14 ENCOUNTER — Other Ambulatory Visit: Payer: Self-pay | Admitting: *Deleted

## 2012-09-14 DIAGNOSIS — C9002 Multiple myeloma in relapse: Secondary | ICD-10-CM

## 2012-09-14 MED ORDER — DEXAMETHASONE 4 MG PO TABS: 20.0000 mg | ORAL_TABLET | ORAL | Status: DC

## 2012-09-15 ENCOUNTER — Other Ambulatory Visit: Payer: Self-pay | Admitting: *Deleted

## 2012-09-15 DIAGNOSIS — C9000 Multiple myeloma not having achieved remission: Secondary | ICD-10-CM

## 2012-09-15 MED ORDER — LENALIDOMIDE 10 MG PO CAPS: 10.0000 mg | ORAL_CAPSULE | Freq: Every day | ORAL | Status: DC

## 2012-10-07 ENCOUNTER — Other Ambulatory Visit: Payer: 59 | Admitting: Lab

## 2012-10-13 ENCOUNTER — Other Ambulatory Visit: Payer: Self-pay | Admitting: *Deleted

## 2012-10-13 DIAGNOSIS — C9000 Multiple myeloma not having achieved remission: Secondary | ICD-10-CM

## 2012-10-13 MED ORDER — LENALIDOMIDE 10 MG PO CAPS: 10.0000 mg | ORAL_CAPSULE | Freq: Every day | ORAL | Status: DC

## 2012-10-13 NOTE — Telephone Encounter (Signed)
Signed refill request for Revlimid faxed to [facility] at fax # [phone]

## 2012-10-15 ENCOUNTER — Other Ambulatory Visit: Payer: Self-pay | Admitting: *Deleted

## 2012-10-15 DIAGNOSIS — C9002 Multiple myeloma in relapse: Secondary | ICD-10-CM

## 2012-10-15 MED ORDER — SULFAMETHOXAZOLE-TMP DS 800-160 MG PO TABS: 1.0000 | ORAL_TABLET | ORAL | Status: DC

## 2012-10-21 ENCOUNTER — Ambulatory Visit (HOSPITAL_BASED_OUTPATIENT_CLINIC_OR_DEPARTMENT_OTHER): Payer: 59

## 2012-10-21 ENCOUNTER — Other Ambulatory Visit (HOSPITAL_BASED_OUTPATIENT_CLINIC_OR_DEPARTMENT_OTHER): Payer: 59 | Admitting: Lab

## 2012-10-21 ENCOUNTER — Ambulatory Visit: Payer: 59

## 2012-10-21 ENCOUNTER — Other Ambulatory Visit: Payer: Self-pay | Admitting: Medical Oncology

## 2012-10-21 VITALS — BP 106/74 | HR 105 | Temp 98.2°F | Resp 20

## 2012-10-21 DIAGNOSIS — C9002 Multiple myeloma in relapse: Secondary | ICD-10-CM

## 2012-10-21 DIAGNOSIS — C9000 Multiple myeloma not having achieved remission: Secondary | ICD-10-CM

## 2012-10-21 LAB — CBC WITH DIFFERENTIAL/PLATELET
Eosinophils Absolute: 0.3 10*3/uL (ref 0.0–0.5)
HCT: 37.5 % (ref 34.8–46.6)
LYMPH%: 40.3 % (ref 14.0–49.7)
MONO#: 0.8 10*3/uL (ref 0.1–0.9)
NEUT#: 1 10*3/uL — ABNORMAL LOW (ref 1.5–6.5)
Platelets: 110 10*3/uL — ABNORMAL LOW (ref 145–400)
RBC: 3.66 10*6/uL — ABNORMAL LOW (ref 3.70–5.45)
WBC: 3.7 10*3/uL — ABNORMAL LOW (ref 3.9–10.3)
lymph#: 1.5 10*3/uL (ref 0.9–3.3)

## 2012-10-21 LAB — COMPREHENSIVE METABOLIC PANEL (CC13)
ALT: 35 U/L (ref 0–55)
AST: 20 U/L (ref 5–34)
Albumin: 3.8 g/dL (ref 3.5–5.0)
Alkaline Phosphatase: 44 U/L (ref 40–150)
Glucose: 102 mg/dl (ref 70–140)
Potassium: 3.7 mEq/L (ref 3.5–5.1)
Sodium: 143 mEq/L (ref 136–145)
Total Bilirubin: 0.52 mg/dL (ref 0.20–1.20)
Total Protein: 6.1 g/dL — ABNORMAL LOW (ref 6.4–8.3)

## 2012-10-21 MED ORDER — HEPARIN SOD (PORK) LOCK FLUSH 100 UNIT/ML IV SOLN
500.0000 [IU] | Freq: Once | INTRAVENOUS | Status: AC
  Administered 2012-10-21: 500 [IU] via INTRAVENOUS
  Filled 2012-10-21: qty 5

## 2012-10-21 MED ORDER — PANTOPRAZOLE SODIUM 40 MG PO TBEC: 40.0000 mg | DELAYED_RELEASE_TABLET | Freq: Every day | ORAL | Status: DC | PRN

## 2012-10-21 MED ORDER — ZOLEDRONIC ACID 4 MG/100ML IV SOLN
4.0000 mg | Freq: Once | INTRAVENOUS | Status: AC
  Administered 2012-10-21: 4 mg via INTRAVENOUS
  Filled 2012-10-21: qty 100

## 2012-10-21 MED ORDER — SODIUM CHLORIDE 0.9 % IJ SOLN
10.0000 mL | INTRAMUSCULAR | Status: DC | PRN
  Administered 2012-10-21: 10 mL via INTRAVENOUS
  Filled 2012-10-21: qty 10

## 2012-10-21 MED ORDER — SODIUM CHLORIDE 0.9 % IV SOLN: Freq: Once | INTRAVENOUS | Status: DC

## 2012-10-21 MED ORDER — SODIUM CHLORIDE 0.9 % IJ SOLN
10.0000 mL | INTRAMUSCULAR | Status: DC | PRN
  Filled 2012-10-21: qty 10

## 2012-10-21 NOTE — Progress Notes (Signed)
Patient accessed in the E section patient never came to flush room.

## 2012-10-21 NOTE — Patient Instructions (Addendum)
Zoledronic Acid injection (Hypercalcemia, Oncology) What is this medicine? ZOLEDRONIC ACID (ZOE le dron ik AS id) lowers the amount of calcium loss from bone. It is used to treat too much calcium in your blood from cancer. It is also used to prevent complications of cancer that has spread to the bone. This medicine may be used for other purposes; ask your health care provider or pharmacist if you have questions. What should I tell my health care provider before I take this medicine? They need to know if you have any of these conditions: -aspirin-sensitive asthma -dental disease -kidney disease -an unusual or allergic reaction to zoledronic acid, other medicines, foods, dyes, or preservatives -pregnant or trying to get pregnant -breast-feeding How should I use this medicine? This medicine is for infusion into a vein. It is given by a health care professional in a hospital or clinic setting. Talk to your pediatrician regarding the use of this medicine in children. Special care may be needed. Overdosage: If you think you have taken too much of this medicine contact a poison control center or emergency room at once. NOTE: This medicine is only for you. Do not share this medicine with others. What if I miss a dose? It is important not to miss your dose. Call your doctor or health care professional if you are unable to keep an appointment. What may interact with this medicine? -certain antibiotics given by injection -NSAIDs, medicines for pain and inflammation, like ibuprofen or naproxen -some diuretics like bumetanide, furosemide -teriparatide -thalidomide This list may not describe all possible interactions. Give your health care provider a list of all the medicines, herbs, non-prescription drugs, or dietary supplements you use. Also tell them if you smoke, drink alcohol, or use illegal drugs. Some items may interact with your medicine. What should I watch for while using this medicine? Visit  your doctor or health care professional for regular checkups. It may be some time before you see the benefit from this medicine. Do not stop taking your medicine unless your doctor tells you to. Your doctor may order blood tests or other tests to see how you are doing. Women should inform their doctor if they wish to become pregnant or think they might be pregnant. There is a potential for serious side effects to an unborn child. Talk to your health care professional or pharmacist for more information. You should make sure that you get enough calcium and vitamin D while you are taking this medicine. Discuss the foods you eat and the vitamins you take with your health care professional. Some people who take this medicine have severe bone, joint, and/or muscle pain. This medicine may also increase your risk for a broken thigh bone. Tell your doctor right away if you have pain in your upper leg or groin. Tell your doctor if you have any pain that does not go away or that gets worse. What side effects may I notice from receiving this medicine? Side effects that you should report to your doctor or health care professional as soon as possible: -allergic reactions like skin rash, itching or hives, swelling of the face, lips, or tongue -anxiety, confusion, or depression -breathing problems -changes in vision -feeling faint or lightheaded, falls -jaw burning, cramping, pain -muscle cramps, stiffness, or weakness -trouble passing urine or change in the amount of urine Side effects that usually do not require medical attention (report to your doctor or health care professional if they continue or are bothersome): -bone, joint, or muscle pain -  fever -hair loss -irritation at site where injected -loss of appetite -nausea, vomiting -stomach upset -tired This list may not describe all possible side effects. Call your doctor for medical advice about side effects. You may report side effects to FDA at  1-800-FDA-1088. Where should I keep my medicine? This drug is given in a hospital or clinic and will not be stored at home. NOTE: This sheet is a summary. It may not cover all possible information. If you have questions about this medicine, talk to your doctor, pharmacist, or health care provider.  2013, [employer]. (08/23/2010 9:06:58 AM)  

## 2012-10-22 LAB — KAPPA/LAMBDA LIGHT CHAINS
Kappa free light chain: 0.16 mg/dL — ABNORMAL LOW (ref 0.33–1.94)
Kappa:Lambda Ratio: 0.31 (ref 0.26–1.65)

## 2012-10-28 ENCOUNTER — Ambulatory Visit: Payer: 59

## 2012-10-28 ENCOUNTER — Other Ambulatory Visit: Payer: 59 | Admitting: Lab

## 2012-10-29 ENCOUNTER — Other Ambulatory Visit: Payer: 59 | Admitting: Lab

## 2012-10-29 ENCOUNTER — Ambulatory Visit (HOSPITAL_BASED_OUTPATIENT_CLINIC_OR_DEPARTMENT_OTHER): Payer: 59 | Admitting: Oncology

## 2012-10-29 ENCOUNTER — Ambulatory Visit: Payer: 59

## 2012-10-29 ENCOUNTER — Telehealth: Payer: Self-pay | Admitting: Oncology

## 2012-10-29 VITALS — BP 114/83 | HR 106 | Temp 98.9°F | Resp 20 | Ht 59.0 in | Wt 123.3 lb

## 2012-10-29 DIAGNOSIS — C9001 Multiple myeloma in remission: Secondary | ICD-10-CM

## 2012-10-29 DIAGNOSIS — C9002 Multiple myeloma in relapse: Secondary | ICD-10-CM

## 2012-10-29 NOTE — Telephone Encounter (Signed)
gv and printed appt sched and avs for pt  °

## 2012-10-30 NOTE — Progress Notes (Signed)
Hematology and Oncology Follow Up Visit  Jessica Cochran [id number] 05-Oct-1960 52 y.o. 10/30/2012 1:53 PM   Principle Diagnosis: Encounter Diagnoses  Name Primary?  . Multiple myeloma in remission   . Multiple myeloma in relapse Yes     Interim History:   Followup visit for this 52 year old woman diagnosed with kappa light chain multiple myeloma in May 2009 when she presented with back pain, anemia, hypercalcemia, and proteinuria. Please see my summary note dated 02/20/2012 for full details. Briefly, she received induction therapy with RVD and achieved an excellent response. She went on to receive high-dose IV melphalan with autologous stem cell support in October 2009. She received monthly Zometa for the first 2 years and then changed to an every 6 month schedule.  She showed signs of early progression in June 2013 with rising kappa free light chains in the serum and appearance of monoclonal proteins in the urine. Bone marrow aspirate and biopsy done 09/25/2011 showed only 4% plasma cells. Negative cytogenetic studies.  I put her back on treatment with a combination of Revlimid 25 mg 3 weeks on one-week off plus dexamethasone 40 mg by mouth weekly beginning in August 2013. She  had a nice response with fall in her paraprotein levels back to normal and in January 2014, I modified her scheduled by deleting the steroids and dropping the Revlimid down to a single 10 mg daily dose. She is receiving Zometa on an every six-month basis.  Serum free light chains remain low as of the values obtained on 10/21/2012:  kappa 0.16 mg percent, kappa/lambda ratio 0.31. Most recent 24-hour urine done on April 23 showed a total 24-hour urine protein of 31 mg. Monoclonal free kappa light chains still seen on IFE.  Overall she is doing well. She still takes chronic narcotic analgesics for her back. I have not been able to get her off them completely. She has had no interim fever or  infection.      Medications: reviewed  Allergies: No Known Allergies  Review of Systems: Constitutional:   No constitutional symptoms Respiratory: No cough or dyspnea Cardiovascular:  No chest pain or palpitations Gastrointestinal: No abdominal pain or change in bowel habit Genito-Urinary: No urinary tract symptoms Musculoskeletal: Stable back pain controlled by meds Neurologic: No headache or change in vision Skin: No rash or ecchymosis Remaining ROS negative.  Physical Exam: Blood pressure 114/83, pulse 106, temperature 98.9 F (37.2 C), temperature source Oral, resp. rate 20, height 4\' 11"  (1.499 m), weight 123 lb 4.8 oz (55.929 kg). Wt Readings from Last 3 Encounters:  10/29/12 123 lb 4.8 oz (55.929 kg)  08/30/12 126 lb 9.6 oz (57.425 kg)  05/26/12 129 lb 11.2 oz (58.832 kg)     General appearance: Frail Caucasian woman, NAD HENNT: Pharynx no erythema or exudate Lymph nodes: No adenopathy Breasts: Lungs: Clear to auscultation resonant to percussion Heart: Regular rhythm no murmur Abdomen: Soft, nontender Extremities: No edema, no calf tenderness Musculoskeletal: No joint deformities GU: Vascular: No carotid bruits, no cyanosis Neurologic: Alert and oriented, PERRLA, motor strength 5 over 5, reflexes 1+ symmetric, sensation intact to vibration over the fingertips Skin: Very dry skin on her arms and hands  Lab Results: Lab Results  Component Value Date   WBC 3.7* 10/21/2012   HGB 12.7 10/21/2012   HCT 37.5 10/21/2012   MCV 102.5* 10/21/2012   PLT 110* 10/21/2012     Chemistry      Component Value Date/Time   NA 143 10/21/2012 1013  NA 143 10/10/2011 1023   K 3.7 10/21/2012 1013   K 3.8 10/10/2011 1023   CL 107 08/30/2012 1055   CL 105 10/10/2011 1023   CO2 25 10/21/2012 1013   CO2 29 10/10/2011 1023   BUN 14.9 10/21/2012 1013   BUN 16 10/10/2011 1023   CREATININE 0.9 10/21/2012 1013   CREATININE 0.83 10/10/2011 1023   CREATININE 0.75 07/17/2009 1541      Component  Value Date/Time   CALCIUM 8.8 10/21/2012 1013   CALCIUM 9.3 10/10/2011 1023   ALKPHOS 44 10/21/2012 1013   ALKPHOS 54 08/19/2011 1353   AST 20 10/21/2012 1013   AST 23 08/19/2011 1353   ALT 35 10/21/2012 1013   ALT 11 08/19/2011 1353   BILITOT 0.52 10/21/2012 1013   BILITOT 0.3 08/19/2011 1353       Radiological Studies: Most recent skeletal survey 07/14/2012 with no new myeloma lesions. Most recent skeletal survey done 07/14/2012 with no new bone lesions. .  Impression: #1.Kappa light chain multiple myeloma in second remission. I told her that if her lab parameters and a 24-hour urine protein remain stable, I will decrease her Revlimid to 10 mg 3 weeks on one week rest.  continue Zometa every 6 months.  #2. 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 Dr. Laurann Montana; Dr. Hilda Lias; Dr. Radene Ou   Levert Feinstein, MD 8/23/20141:53 PM

## 2012-11-04 ENCOUNTER — Ambulatory Visit: Payer: 59 | Admitting: Lab

## 2012-11-04 DIAGNOSIS — C9002 Multiple myeloma in relapse: Secondary | ICD-10-CM

## 2012-11-09 ENCOUNTER — Telehealth: Payer: Self-pay | Admitting: Oncology

## 2012-11-09 ENCOUNTER — Other Ambulatory Visit: Payer: Self-pay | Admitting: *Deleted

## 2012-11-09 ENCOUNTER — Telehealth: Payer: Self-pay | Admitting: *Deleted

## 2012-11-09 DIAGNOSIS — C9000 Multiple myeloma not having achieved remission: Secondary | ICD-10-CM

## 2012-11-09 LAB — UIFE/LIGHT CHAINS/TP QN, 24-HR UR
Albumin, U: DETECTED
Free Kappa Lt Chains,Ur: 0.5 mg/dL (ref 0.14–2.42)
Free Kappa/Lambda Ratio: 12.5 ratio — ABNORMAL HIGH (ref 2.04–10.37)
Free Lambda Excretion/Day: 0.64 mg/d
Time: 24 hours
Total Protein, Urine-Ur/day: 21 mg/d (ref 10–140)
Total Protein, Urine: 1.3 mg/dL

## 2012-11-09 MED ORDER — LENALIDOMIDE 10 MG PO CAPS: 10.0000 mg | ORAL_CAPSULE | Freq: Every day | ORAL | Status: DC

## 2012-11-09 NOTE — Telephone Encounter (Signed)
lvm for pt regarding to all appts and confirming that she has mthly labs with flush

## 2012-11-09 NOTE — Telephone Encounter (Signed)
Patient left message requesting nurse Sabino Snipes call her when she is back in office. Would not give this nurse any information, saying it could wait. Not urgent. Is aware of physician being out of the office all week.

## 2012-11-10 ENCOUNTER — Other Ambulatory Visit: Payer: Self-pay | Admitting: *Deleted

## 2012-11-12 ENCOUNTER — Telehealth: Payer: Self-pay | Admitting: *Deleted

## 2012-11-12 NOTE — Telephone Encounter (Signed)
Message copied by Orbie Hurst on Fri Nov 12, 2012  1:07 PM ------      Message from: Levert Feinstein      Created: Thu Nov 11, 2012  4:27 PM       Call pt  No abnormal protein in urine  OK to go to 3 weeks on 1 week off on Revlimid ------

## 2012-11-12 NOTE — Telephone Encounter (Signed)
Patient called and gave her the same information.  When revlimid comes she will start at normal time and just take for 21 days and skip the next 7 days.

## 2012-11-12 NOTE — Telephone Encounter (Signed)
Unable to reach patient at home.  Spoke to husband on cell phone listed.  Let him know that there was no abnormal protein in her urine.  OK to go to 3 weeks on and 1 week off of the Revlimid.  He appreciated my call.  He will relay the information and knows to call us for any questions or clarification.  (Also LM on home # that we were trying to reach patient with results)

## 2012-12-09 ENCOUNTER — Ambulatory Visit (HOSPITAL_BASED_OUTPATIENT_CLINIC_OR_DEPARTMENT_OTHER): Payer: 59

## 2012-12-09 ENCOUNTER — Other Ambulatory Visit (HOSPITAL_BASED_OUTPATIENT_CLINIC_OR_DEPARTMENT_OTHER): Payer: 59 | Admitting: Lab

## 2012-12-09 VITALS — BP 115/88 | HR 104 | Temp 96.7°F

## 2012-12-09 DIAGNOSIS — C9000 Multiple myeloma not having achieved remission: Secondary | ICD-10-CM

## 2012-12-09 DIAGNOSIS — Z452 Encounter for adjustment and management of vascular access device: Secondary | ICD-10-CM

## 2012-12-09 DIAGNOSIS — C9002 Multiple myeloma in relapse: Secondary | ICD-10-CM

## 2012-12-09 LAB — COMPREHENSIVE METABOLIC PANEL (CC13)
ALT: 13 U/L (ref 0–55)
AST: 15 U/L (ref 5–34)
Albumin: 3.6 g/dL (ref 3.5–5.0)
Alkaline Phosphatase: 41 U/L (ref 40–150)
BUN: 12.7 mg/dL (ref 7.0–26.0)
Calcium: 8.8 mg/dL (ref 8.4–10.4)
Creatinine: 0.8 mg/dL (ref 0.6–1.1)
Glucose: 101 mg/dl (ref 70–140)
Sodium: 143 mEq/L (ref 136–145)
Total Bilirubin: 0.4 mg/dL (ref 0.20–1.20)
Total Protein: 5.8 g/dL — ABNORMAL LOW (ref 6.4–8.3)

## 2012-12-09 LAB — CBC WITH DIFFERENTIAL/PLATELET
EOS%: 6.8 % (ref 0.0–7.0)
Eosinophils Absolute: 0.3 10*3/uL (ref 0.0–0.5)
HGB: 12.4 g/dL (ref 11.6–15.9)
MCV: 104.2 fL — ABNORMAL HIGH (ref 79.5–101.0)
MONO%: 15.8 % — ABNORMAL HIGH (ref 0.0–14.0)
NEUT#: 2.1 10*3/uL (ref 1.5–6.5)
RBC: 3.46 10*6/uL — ABNORMAL LOW (ref 3.70–5.45)
RDW: 13.6 % (ref 11.2–14.5)
WBC: 4.5 10*3/uL (ref 3.9–10.3)
lymph#: 1.3 10*3/uL (ref 0.9–3.3)

## 2012-12-09 MED ORDER — HEPARIN SOD (PORK) LOCK FLUSH 100 UNIT/ML IV SOLN
500.0000 [IU] | Freq: Once | INTRAVENOUS | Status: AC
  Administered 2012-12-09: 500 [IU] via INTRAVENOUS
  Filled 2012-12-09: qty 5

## 2012-12-09 MED ORDER — SODIUM CHLORIDE 0.9 % IJ SOLN
10.0000 mL | INTRAMUSCULAR | Status: DC | PRN
  Administered 2012-12-09: 10 mL via INTRAVENOUS
  Filled 2012-12-09: qty 10

## 2012-12-09 NOTE — Patient Instructions (Addendum)
Implanted Port Instructions  An implanted port is a central line that has a round shape and is placed under the skin. It is used for long-term IV (intravenous) access for:  · Medicine.  · Fluids.  · Liquid nutrition, such as TPN (total parenteral nutrition).  · Blood samples.  Ports can be placed:  · In the chest area just below the collarbone (this is the most common place.)  · In the arms.  · In the belly (abdomen) area.  · In the legs.  PARTS OF THE PORT  A port has 2 main parts:  · The reservoir. The reservoir is round, disc-shaped, and will be a small, raised area under your skin.  · The reservoir is the part where a needle is inserted (accessed) to either give medicines or to draw blood.  · The catheter. The catheter is a long, slender tube that extends from the reservoir. The catheter is placed into a large vein.  · Medicine that is inserted into the reservoir goes into the catheter and then into the vein.  INSERTION OF THE PORT  · The port is surgically placed in either an operating room or in a procedural area (interventional radiology).  · Medicine may be given to help you relax during the procedure.  · The skin where the port will be inserted is numbed (local anesthetic).  · 1 or 2 small cuts (incisions) will be made in the skin to insert the port.  · The port can be used after it has been inserted.  INCISION SITE CARE  · The incision site may have small adhesive strips on it. This helps keep the incision site closed. Sometimes, no adhesive strips are placed. Instead of adhesive strips, a special kind of surgical glue is used to keep the incision closed.  · If adhesive strips were placed on the incision sites, do not take them off. They will fall off on their own.  · The incision site may be sore for 1 to 2 days. Pain medicine can help.  · Do not get the incision site wet. Bathe or shower as directed by your caregiver.  · The incision site should heal in 5 to 7 days. A small scar may form after the  incision has healed.  ACCESSING THE PORT  Special steps must be taken to access the port:  · Before the port is accessed, a numbing cream can be placed on the skin. This helps numb the skin over the port site.  · A sterile technique is used to access the port.  · The port is accessed with a needle. Only "non-coring" port needles should be used to access the port. Once the port is accessed, a blood return should be checked. This helps ensure the port is in the vein and is not clogged (clotted).  · If your caregiver believes your port should remain accessed, a clear (transparent) bandage will be placed over the needle site. The bandage and needle will need to be changed every week or as directed by your caregiver.  · Keep the bandage covering the needle clean and dry. Do not get it wet. Follow your caregiver's instructions on how to take a shower or bath when the port is accessed.  · If your port does not need to stay accessed, no bandage is needed over the port.  FLUSHING THE PORT  Flushing the port keeps it from getting clogged. How often the port is flushed depends on:  · If a   constant infusion is running. If a constant infusion is running, the port may not need to be flushed.  · If intermittent medicines are given.  · If the port is not being used.  For intermittent medicines:  · The port will need to be flushed:  · After medicines have been given.  · After blood has been drawn.  · As part of routine maintenance.  · A port is normally flushed with:  · Normal saline.  · Heparin.  · Follow your caregiver's advice on how often, how much, and the type of flush to use on your port.  IMPORTANT PORT INFORMATION  · Tell your caregiver if you are allergic to heparin.  · After your port is placed, you will get a manufacturer's information card. The card has information about your port. Keep this card with you at all times.  · There are many types of ports available. Know what kind of port you have.  · In case of an  emergency, it may be helpful to wear a medical alert bracelet. This can help alert health care workers that you have a port.  · The port can stay in for as long as your caregiver believes it is necessary.  · When it is time for the port to come out, surgery will be done to remove it. The surgery will be similar to how the port was put in.  · If you are in the hospital or clinic:  · Your port will be taken care of and flushed by a nurse.  · If you are at home:  · A home health care nurse may give medicines and take care of the port.  · You or a family member can get special training and directions for giving medicine and taking care of the port at home.  SEEK IMMEDIATE MEDICAL CARE IF:   · Your port does not flush or you are unable to get a blood return.  · New drainage or pus is coming from the incision.  · A bad smell is coming from the incision site.  · You develop swelling or increased redness at the incision site.  · You develop increased swelling or pain at the port site.  · You develop swelling or pain in the surrounding skin near the port.  · You have an oral temperature above 102° F (38.9° C), not controlled by medicine.  MAKE SURE YOU:   · Understand these instructions.  · Will watch your condition.  · Will get help right away if you are not doing well or get worse.  Document Released: 02/24/2005 Document Revised: 05/19/2011 Document Reviewed: 05/18/2008  [employer]® Patient Information ©2014 [employer].

## 2012-12-10 ENCOUNTER — Other Ambulatory Visit: Payer: Self-pay | Admitting: *Deleted

## 2012-12-10 DIAGNOSIS — C9000 Multiple myeloma not having achieved remission: Secondary | ICD-10-CM

## 2012-12-10 LAB — KAPPA/LAMBDA LIGHT CHAINS
Kappa free light chain: 0.64 mg/dL (ref 0.33–1.94)
Lambda Free Lght Chn: 0.87 mg/dL (ref 0.57–2.63)

## 2012-12-10 MED ORDER — LENALIDOMIDE 10 MG PO CAPS: ORAL_CAPSULE | ORAL | Status: DC

## 2012-12-13 ENCOUNTER — Telehealth: Payer: Self-pay | Admitting: *Deleted

## 2012-12-13 NOTE — Telephone Encounter (Signed)
Called patient back and left message on machine as requested.  Her antibody levels are good.

## 2012-12-13 NOTE — Telephone Encounter (Signed)
Message copied by Orbie Hurst on Mon Dec 13, 2012  4:17 PM ------      Message from: Levert Feinstein      Created: Fri Dec 10, 2012  3:42 PM       Call antibody levels good ------

## 2012-12-14 ENCOUNTER — Other Ambulatory Visit: Payer: Self-pay | Admitting: Oncology

## 2012-12-14 DIAGNOSIS — C9000 Multiple myeloma not having achieved remission: Secondary | ICD-10-CM

## 2012-12-14 DIAGNOSIS — C9002 Multiple myeloma in relapse: Secondary | ICD-10-CM

## 2012-12-24 ENCOUNTER — Other Ambulatory Visit: Payer: 59

## 2012-12-24 ENCOUNTER — Ambulatory Visit (HOSPITAL_BASED_OUTPATIENT_CLINIC_OR_DEPARTMENT_OTHER): Payer: 59 | Admitting: Nurse Practitioner

## 2012-12-24 ENCOUNTER — Encounter: Payer: Self-pay | Admitting: *Deleted

## 2012-12-24 ENCOUNTER — Telehealth: Payer: Self-pay | Admitting: Oncology

## 2012-12-24 VITALS — BP 137/85 | HR 101 | Temp 97.9°F | Resp 20 | Ht 59.0 in | Wt 123.6 lb

## 2012-12-24 DIAGNOSIS — D696 Thrombocytopenia, unspecified: Secondary | ICD-10-CM

## 2012-12-24 DIAGNOSIS — Z23 Encounter for immunization: Secondary | ICD-10-CM

## 2012-12-24 DIAGNOSIS — C9002 Multiple myeloma in relapse: Secondary | ICD-10-CM

## 2012-12-24 DIAGNOSIS — G8929 Other chronic pain: Secondary | ICD-10-CM

## 2012-12-24 DIAGNOSIS — C9001 Multiple myeloma in remission: Secondary | ICD-10-CM

## 2012-12-24 DIAGNOSIS — M549 Dorsalgia, unspecified: Secondary | ICD-10-CM

## 2012-12-24 MED ORDER — INFLUENZA VAC SPLIT QUAD 0.5 ML IM SUSP
0.5000 mL | Freq: Once | INTRAMUSCULAR | Status: AC
  Administered 2012-12-24: 0.5 mL via INTRAMUSCULAR
  Filled 2012-12-24: qty 0.5

## 2012-12-24 NOTE — Progress Notes (Signed)
OFFICE PROGRESS NOTE  Interval history:   Jessica Cochran is a 52 year old woman with relapsed kappa light chain multiple myeloma. She was started back on treatment with Revlimid 25 mg 3 weeks on/1 week off plus dexamethasone 40 mg weekly beginning 10/20/2011. The Revlimid dose was adjusted to a maintenance dose of 10 mg daily with continuation of dexamethasone 40 mg weekly beginning approximately 03/07/2012. Serum light chain analysis from 10/21/2012 was stable. 24-hour urine done 11/04/2012 showed no monoclonal free light chains. The Revlimid schedule was adjusted to 10 mg 3 weeks on/1 week off. Most recent serum light chain analysis on 12/09/2012 was stable.  She is seen today for scheduled followup.  No interim illnesses or infections. She overall feels well. She occasionally notes that she is "tired". She denies nausea/vomiting. No mouth sores. No diarrhea. She is periodically constipated. No skin rash. She denies shortness breath, chest pain and cough. No leg swelling or calf pain. She has stable chronic back pain. She continues OxyContin 60 mg every 12 hours with oxycodone every 4 hours as needed. She denies any neuropathy symptoms. She periodically has cramps in the toes and hands.   Objective: Blood pressure 137/85, pulse 101, temperature 97.9 F (36.6 C), temperature source Oral, resp. rate 20, height 4\' 11"  (1.499 m), weight 123 lb 9.6 oz (56.065 kg).  Oropharynx is without thrush or ulceration. No palpable cervical, supraclavicular or axillary lymph nodes. Lungs are clear. Regular cardiac rhythm. No murmur. Abdomen is soft and nontender. No hepatomegaly. Extremities are without edema. Calves soft and nontender. Motor strength 5 over 5. Knee DTRs 1+, symmetric. Vibratory sense intact over the fingertips per tuning fork exam. No skin rash.  Lab Results: Lab Results  Component Value Date   WBC 4.5 12/09/2012   HGB 12.4 12/09/2012   HCT 36.0 12/09/2012   MCV 104.2* 12/09/2012   PLT 117*  12/09/2012    Chemistry:    Chemistry      Component Value Date/Time   NA 143 12/09/2012 1108   NA 143 10/10/2011 1023   K 3.5 12/09/2012 1108   K 3.8 10/10/2011 1023   CL 107 08/30/2012 1055   CL 105 10/10/2011 1023   CO2 27 12/09/2012 1108   CO2 29 10/10/2011 1023   BUN 12.7 12/09/2012 1108   BUN 16 10/10/2011 1023   CREATININE 0.8 12/09/2012 1108   CREATININE 0.83 10/10/2011 1023   CREATININE 0.75 07/17/2009 1541      Component Value Date/Time   CALCIUM 8.8 12/09/2012 1108   CALCIUM 9.3 10/10/2011 1023   ALKPHOS 41 12/09/2012 1108   ALKPHOS 54 08/19/2011 1353   AST 15 12/09/2012 1108   AST 23 08/19/2011 1353   ALT 13 12/09/2012 1108   ALT 11 08/19/2011 1353   BILITOT 0.40 12/09/2012 1108   BILITOT 0.3 08/19/2011 1353       Studies/Results: No results found.  Medications: I have reviewed the patient's current medications.  Assessment/Plan:  1. Kappa light chain myeloma initially diagnosed May 2009 at which time she presented with severe back pain, hypercalcemia, anemia and proteinuria. She had 16 g of protein in her urine. Initial creatinine of 2. She underwent a lumbar vertebroplasty for a pathologic fracture. She was treated with pulse high dose dexamethasone and subsequently induced into a remission with a combination of Revlimid/Velcade/dexamethasone. She went on to receive high-dose IV melphalan with autologous stem cell support at [facility] on 12/30/2007. Routine survey laboratory studies done 08/19/2011 showed a rise the serum kappa  free light chains. Bone marrow aspiration and biopsy on 09/25/2011 showed 4% plasma cells. No large aggregates or sheets of plasma cells. Immunohistochemical staining showed positive polyclonal staining pattern although there was kappa light chain excess "most suggestive of subtle/early involvement by plasma cell dyscrasia". Cytogenetic analysis was normal. She began Revlimid 25 mg daily 3 weeks on/1 week off and dexamethasone 40 mg weekly beginning 10/20/2011  with a good response. She began a maintenance dose of Revlimid 10 mg daily with continuation of dexamethasone 40 mg weekly around 03/07/2012. Revlimid schedule further adjusted to 10 mg 3 weeks on/1 week off following office visit 10/30/2012.  2. History of recurrent herpes zoster infections. She continues prophylactic Valtrex. 3. Status post initial lumbar vertebroplasty for pathologic fracture related to myeloma at diagnosis 4 years ago. She has stable chronic back pain. 4. Mild thrombocytopenia. Stable. 5. Asymmetric leg edema right greater than left noted 01/05/2012. Bilateral lower extremity venous Doppler negative for DVT.  Disposition-Ms. Panas appears stable. Most recent serum light chain analysis was stable. She will continue Revlimid 10 mg 3 weeks on/1 week off. She receives Zometa every 6 month with the last infusion given on 10/21/2012. Labs are being checked on a 4 week schedule. She received the influenza vaccine in the office today. She will return for a followup visit in approximately 8 weeks. She knows to contact the office between visits with any problems.  Lonna Cobb ANP/GNP-BC

## 2012-12-24 NOTE — Telephone Encounter (Signed)
gave pt appt for lab and MD and flush for November and december 2014

## 2012-12-31 ENCOUNTER — Other Ambulatory Visit: Payer: Self-pay | Admitting: *Deleted

## 2012-12-31 DIAGNOSIS — C9000 Multiple myeloma not having achieved remission: Secondary | ICD-10-CM

## 2012-12-31 NOTE — Telephone Encounter (Signed)
THIS REFILL REQUEST FOR REVLIMID WAS PLACED ON DR.GRANFORTUNA'S DESK. 

## 2013-01-04 MED ORDER — LENALIDOMIDE 10 MG PO CAPS: ORAL_CAPSULE | ORAL | Status: DC

## 2013-01-04 NOTE — Addendum Note (Signed)
Addended by: Arvilla Meres on: 01/04/2013 10:35 AM   Modules accepted: Orders

## 2013-01-06 ENCOUNTER — Other Ambulatory Visit: Payer: Self-pay | Admitting: Oncology

## 2013-01-06 DIAGNOSIS — C9001 Multiple myeloma in remission: Secondary | ICD-10-CM

## 2013-01-06 DIAGNOSIS — C9002 Multiple myeloma in relapse: Secondary | ICD-10-CM

## 2013-01-20 ENCOUNTER — Other Ambulatory Visit: Payer: 59 | Admitting: Lab

## 2013-01-21 ENCOUNTER — Other Ambulatory Visit (HOSPITAL_BASED_OUTPATIENT_CLINIC_OR_DEPARTMENT_OTHER): Payer: 59 | Admitting: Lab

## 2013-01-21 ENCOUNTER — Ambulatory Visit: Payer: 59

## 2013-01-21 VITALS — BP 112/76 | HR 86 | Temp 97.6°F | Resp 18

## 2013-01-21 DIAGNOSIS — C9000 Multiple myeloma not having achieved remission: Secondary | ICD-10-CM

## 2013-01-21 DIAGNOSIS — C9002 Multiple myeloma in relapse: Secondary | ICD-10-CM

## 2013-01-21 DIAGNOSIS — C9001 Multiple myeloma in remission: Secondary | ICD-10-CM

## 2013-01-21 LAB — COMPREHENSIVE METABOLIC PANEL (CC13)
ALT: 12 U/L (ref 0–55)
Albumin: 3.8 g/dL (ref 3.5–5.0)
Anion Gap: 10 mEq/L (ref 3–11)
BUN: 8.8 mg/dL (ref 7.0–26.0)
CO2: 29 mEq/L (ref 22–29)
Calcium: 8.8 mg/dL (ref 8.4–10.4)
Chloride: 105 mEq/L (ref 98–109)
Creatinine: 0.8 mg/dL (ref 0.6–1.1)
Glucose: 97 mg/dl (ref 70–140)
Total Bilirubin: 0.57 mg/dL (ref 0.20–1.20)
Total Protein: 5.9 g/dL — ABNORMAL LOW (ref 6.4–8.3)

## 2013-01-21 LAB — CBC WITH DIFFERENTIAL/PLATELET
Basophils Absolute: 0 10*3/uL (ref 0.0–0.1)
Eosinophils Absolute: 0.2 10*3/uL (ref 0.0–0.5)
HCT: 39 % (ref 34.8–46.6)
HGB: 13.1 g/dL (ref 11.6–15.9)
MCV: 105 fL — ABNORMAL HIGH (ref 79.5–101.0)
MONO%: 10.7 % (ref 0.0–14.0)
NEUT#: 2.9 10*3/uL (ref 1.5–6.5)
NEUT%: 57 % (ref 38.4–76.8)
RBC: 3.71 10*6/uL (ref 3.70–5.45)
RDW: 14 % (ref 11.2–14.5)

## 2013-01-21 MED ORDER — SODIUM CHLORIDE 0.9 % IJ SOLN
10.0000 mL | INTRAMUSCULAR | Status: DC | PRN
  Administered 2013-01-21: 10 mL via INTRAVENOUS
  Filled 2013-01-21: qty 10

## 2013-01-21 MED ORDER — HEPARIN SOD (PORK) LOCK FLUSH 100 UNIT/ML IV SOLN
500.0000 [IU] | Freq: Once | INTRAVENOUS | Status: AC
  Administered 2013-01-21: 500 [IU] via INTRAVENOUS
  Filled 2013-01-21: qty 5

## 2013-01-24 LAB — KAPPA/LAMBDA LIGHT CHAINS: Kappa:Lambda Ratio: 0.63 (ref 0.26–1.65)

## 2013-01-26 ENCOUNTER — Telehealth: Payer: Self-pay | Admitting: *Deleted

## 2013-01-26 NOTE — Telephone Encounter (Signed)
Message copied by Sabino Snipes on Wed Jan 26, 2013 12:44 PM ------      Message from: Levert Feinstein      Created: Tue Jan 25, 2013  8:44 PM       Call pt antibody levels remain normal ------

## 2013-01-26 NOTE — Telephone Encounter (Signed)
Notified pt of normal antibody results per Dr Cyndie Chime.

## 2013-02-02 ENCOUNTER — Other Ambulatory Visit: Payer: Self-pay | Admitting: *Deleted

## 2013-02-02 ENCOUNTER — Other Ambulatory Visit: Payer: Self-pay | Admitting: Oncology

## 2013-02-02 DIAGNOSIS — C9000 Multiple myeloma not having achieved remission: Secondary | ICD-10-CM

## 2013-02-02 MED ORDER — LENALIDOMIDE 10 MG PO CAPS: ORAL_CAPSULE | ORAL | Status: DC

## 2013-02-23 ENCOUNTER — Other Ambulatory Visit: Payer: 59 | Admitting: Lab

## 2013-02-24 ENCOUNTER — Other Ambulatory Visit: Payer: 59 | Admitting: Lab

## 2013-02-25 ENCOUNTER — Ambulatory Visit: Payer: 59

## 2013-02-25 ENCOUNTER — Other Ambulatory Visit (HOSPITAL_BASED_OUTPATIENT_CLINIC_OR_DEPARTMENT_OTHER): Payer: 59

## 2013-02-25 ENCOUNTER — Ambulatory Visit (HOSPITAL_BASED_OUTPATIENT_CLINIC_OR_DEPARTMENT_OTHER): Payer: 59 | Admitting: Oncology

## 2013-02-25 ENCOUNTER — Telehealth: Payer: Self-pay | Admitting: Oncology

## 2013-02-25 VITALS — BP 127/94 | HR 120 | Temp 97.3°F | Resp 18 | Ht 59.0 in | Wt 122.7 lb

## 2013-02-25 DIAGNOSIS — C9001 Multiple myeloma in remission: Secondary | ICD-10-CM

## 2013-02-25 DIAGNOSIS — C9002 Multiple myeloma in relapse: Secondary | ICD-10-CM

## 2013-02-25 DIAGNOSIS — M549 Dorsalgia, unspecified: Secondary | ICD-10-CM

## 2013-02-25 DIAGNOSIS — G8929 Other chronic pain: Secondary | ICD-10-CM

## 2013-02-25 LAB — COMPREHENSIVE METABOLIC PANEL (CC13)
ALT: 13 U/L (ref 0–55)
Albumin: 4.1 g/dL (ref 3.5–5.0)
Alkaline Phosphatase: 39 U/L — ABNORMAL LOW (ref 40–150)
CO2: 26 mEq/L (ref 22–29)
Glucose: 88 mg/dl (ref 70–140)
Potassium: 3.6 mEq/L (ref 3.5–5.1)
Sodium: 143 mEq/L (ref 136–145)
Total Bilirubin: 0.75 mg/dL (ref 0.20–1.20)
Total Protein: 6.1 g/dL — ABNORMAL LOW (ref 6.4–8.3)

## 2013-02-25 LAB — CBC WITH DIFFERENTIAL/PLATELET
BASO%: 0.3 % (ref 0.0–2.0)
HCT: 38.1 % (ref 34.8–46.6)
HGB: 12.9 g/dL (ref 11.6–15.9)
MCHC: 33.9 g/dL (ref 31.5–36.0)
MONO#: 0.8 10*3/uL (ref 0.1–0.9)
MONO%: 21.8 % — ABNORMAL HIGH (ref 0.0–14.0)
NEUT#: 1.5 10*3/uL (ref 1.5–6.5)
NEUT%: 39.5 % (ref 38.4–76.8)
WBC: 3.9 10*3/uL (ref 3.9–10.3)
lymph#: 1.3 10*3/uL (ref 0.9–3.3)

## 2013-02-25 MED ORDER — HEPARIN SOD (PORK) LOCK FLUSH 100 UNIT/ML IV SOLN
500.0000 [IU] | Freq: Once | INTRAVENOUS | Status: AC
  Administered 2013-02-25: 500 [IU] via INTRAVENOUS
  Filled 2013-02-25: qty 5

## 2013-02-25 MED ORDER — SODIUM CHLORIDE 0.9 % IJ SOLN
10.0000 mL | Freq: Once | INTRAMUSCULAR | Status: AC
  Administered 2013-02-25: 10 mL via INTRAVENOUS
  Filled 2013-02-25: qty 10

## 2013-02-25 NOTE — Patient Instructions (Signed)

## 2013-02-25 NOTE — Telephone Encounter (Signed)
Gave pt appt for lab,ML and zometa for February , pt wants labs ty be drawn on 2/20 3 weeks after 1/30th , left message for nurse regarding labs drawn  3 weeks after 30th of January 2015

## 2013-02-26 NOTE — Progress Notes (Signed)
Hematology and Oncology Follow Up Visit  Jessica Cochran [id number] May 20, 1960 52 y.o. 02/26/2013 3:52 PM   Principle Diagnosis: Encounter Diagnoses  Name Primary?  . Multiple myeloma in relapse Yes  . Chronic back pain      Interim History:  Followup visit for this 52 year old woman diagnosed with kappa light chain multiple myeloma in May 2009 when she presented with back pain, anemia, hypercalcemia, and proteinuria. Please see my summary note dated 02/20/2012 for full details. Briefly, she received induction therapy with RVD and achieved an excellent response. She went on to receive high-dose IV melphalan with autologous stem cell support in October 2009 at [facility]. She received monthly Zometa for the first 2 years and then changed to an every 6 month schedule.  She showed signs of early progression in June 2013 with rising kappa free light chains in the serum and appearance of monoclonal proteins in the urine. Bone marrow aspirate and biopsy done 09/25/2011 showed only 4% plasma cells. Negative cytogenetic studies.  I put her back on treatment with a combination of Revlimid 25 mg 3 weeks on one-week off plus dexamethasone 40 mg by mouth weekly beginning in August 2013. She had a nice response with fall in her paraprotein levels back to normal and in January 2014, I modified her scheduled by deleting the steroids and dropping the Revlimid down to a single 10 mg daily dose. At her request, I made another change recently in August 2014, and decreased her Revlimid to 3 weeks on 1 week off.  She is receiving Zometa on an every six-month basis.  Serum free light chains remain low as of the values obtained on an 01/21/13: kappa 0.15 mg%, lambda 0.24, mg percent, kappa/lambda ratio 0.63 . Most recent 24-hour urine done on November 04, 2012,  showed a total 24-hour urine protein of 21 mg. Monoclonal free kappa light chains not seen on IFE.   Overall she is doing well. She still  takes chronic narcotic analgesics for her back. I have not been able to get her off them completely.   She has had no interim fevers or infection.  She received the flu vaccine the time of her followup visit at [facility] in October.    Medications: reviewed  Allergies: No Known Allergies  Review of Systems: Hematology:  No bleeding or bruising ENT ROS: No sore throat Breast ROS:  Respiratory ROS: No cough or dyspnea Cardiovascular ROS: No chest pain or palpitations Gastrointestinal ROS:  No abdominal pain or change in bowel habit Genito-Urinary ROS: No urinary tract symptoms Musculoskeletal ROS: Chronic, stable, back pain Neurological ROS no headache or change in vision, no paresthesias Dermatological ROS: No rash Remaining ROS negative  Physical Exam: Blood pressure 127/94, pulse 120, temperature 97.3 F (36.3 C), temperature source Oral, resp. rate 18, height 4\' 11"  (1.499 m), weight 122 lb 11.2 oz (55.656 kg). Wt Readings from Last 3 Encounters:  02/25/13 122 lb 11.2 oz (55.656 kg)  12/24/12 123 lb 9.6 oz (56.065 kg)  10/29/12 123 lb 4.8 oz (55.929 kg)     General appearance: Petite Caucasian woman HENNT: Pharynx no erythema, exudate, mass, or ulcer. No thyromegaly or thyroid nodules Lymph nodes: No cervical, supraclavicular, or axillary lymphadenopathy Breasts: Lungs: Clear to auscultation, resonant to percussion throughout Heart: Regular rhythm, no murmur, no gallop, no rub, no click, no edema Abdomen: Soft, nontender, normal bowel sounds, no mass, no organomegaly Extremities: No edema, no calf tenderness Musculoskeletal: no joint deformities GU:  Vascular: Carotid pulses 2+, no bruits Neurologic: Alert, oriented, PERRLA,  , cranial nerves grossly normal, motor strength 5 over 5, reflexes 1+ symmetric, upper body coordination normal, gait normal, sensation intact to vibration by tuning fork exam over the fingertips he Skin: No rash or ecchymosis  Lab  Results: CBC W/Diff    Component Value Date/Time   WBC 3.9 02/25/2013 1150   WBC 4.0 09/25/2011 0730   RBC 3.66* 02/25/2013 1150   RBC 3.95 09/25/2011 0730   HGB 12.9 02/25/2013 1150   HGB 12.9 09/25/2011 0730   HCT 38.1 02/25/2013 1150   HCT 38.8 09/25/2011 0730   PLT 125* 02/25/2013 1150   PLT 95* 09/25/2011 0730   MCV 104.1* 02/25/2013 1150   MCV 98.2 09/25/2011 0730   MCH 35.3* 02/25/2013 1150   MCH 32.7 09/25/2011 0730   MCHC 33.9 02/25/2013 1150   MCHC 33.2 09/25/2011 0730   RDW 13.4 02/25/2013 1150   RDW 12.2 09/25/2011 0730   LYMPHSABS 1.3 02/25/2013 1150   LYMPHSABS 1.2 09/25/2011 0730   MONOABS 0.8 02/25/2013 1150   MONOABS 0.5 09/25/2011 0730   EOSABS 0.2 02/25/2013 1150   EOSABS 0.1 09/25/2011 0730   BASOSABS 0.0 02/25/2013 1150   BASOSABS 0.0 09/25/2011 0730     Chemistry      Component Value Date/Time   NA 143 02/25/2013 1150   NA 143 10/10/2011 1023   K 3.6 02/25/2013 1150   K 3.8 10/10/2011 1023   CL 107 08/30/2012 1055   CL 105 10/10/2011 1023   CO2 26 02/25/2013 1150   CO2 29 10/10/2011 1023   BUN 12.6 02/25/2013 1150   BUN 16 10/10/2011 1023   CREATININE 0.8 02/25/2013 1150   CREATININE 0.83 10/10/2011 1023   CREATININE 0.75 07/17/2009 1541      Component Value Date/Time   CALCIUM 9.3 02/25/2013 1150   CALCIUM 9.3 10/10/2011 1023   ALKPHOS 39* 02/25/2013 1150   ALKPHOS 54 08/19/2011 1353   AST 19 02/25/2013 1150   AST 23 08/19/2011 1353   ALT 13 02/25/2013 1150   ALT 11 08/19/2011 1353   BILITOT 0.75 02/25/2013 1150   BILITOT 0.3 08/19/2011 1353       Radiological Studies: Bone Survey due in May, 2015   Impression: #1.Kappa light chain multiple myeloma in second remission.  I will continue Revlimid to 10 mg 3 weeks on one week rest. continue Zometa every 6 months.   #2. 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 Chronic back pain due to initial pathologic fracture lumbar spine on chronic narcotic analgesics     CC:. Dr. Laurann Montana; Dr. Hilda Lias; Dr. Radene Ou    CC: Patient Care Team: Cala Bradford, MD as PCP - General Levert Feinstein, MD as Consulting Physician (Oncology) Chancy Milroy as Consulting Physician (Oncology) Karn Cassis, MD as Consulting Physician (Neurosurgery)   Levert Feinstein, MD 12/20/20143:52 PM

## 2013-02-28 LAB — KAPPA/LAMBDA LIGHT CHAINS: Kappa:Lambda Ratio: 0.29 (ref 0.26–1.65)

## 2013-03-02 ENCOUNTER — Telehealth: Payer: Self-pay | Admitting: Oncology

## 2013-03-02 ENCOUNTER — Other Ambulatory Visit: Payer: Self-pay | Admitting: *Deleted

## 2013-03-02 DIAGNOSIS — C9000 Multiple myeloma not having achieved remission: Secondary | ICD-10-CM

## 2013-03-02 MED ORDER — LENALIDOMIDE 10 MG PO CAPS: ORAL_CAPSULE | ORAL | Status: DC

## 2013-03-02 NOTE — Telephone Encounter (Signed)
Called pt andf lefct messagerregarding lab, flush, Zometa and ML appt for January and February 2015

## 2013-03-30 ENCOUNTER — Other Ambulatory Visit: Payer: Self-pay | Admitting: *Deleted

## 2013-03-30 DIAGNOSIS — C9000 Multiple myeloma not having achieved remission: Secondary | ICD-10-CM

## 2013-03-30 MED ORDER — LENALIDOMIDE 10 MG PO CAPS: ORAL_CAPSULE | ORAL | Status: DC

## 2013-04-01 ENCOUNTER — Other Ambulatory Visit: Payer: Self-pay | Admitting: *Deleted

## 2013-04-01 DIAGNOSIS — C9002 Multiple myeloma in relapse: Secondary | ICD-10-CM

## 2013-04-01 MED ORDER — DEXAMETHASONE 4 MG PO TABS: ORAL_TABLET | ORAL | Status: DC

## 2013-04-08 ENCOUNTER — Other Ambulatory Visit (HOSPITAL_BASED_OUTPATIENT_CLINIC_OR_DEPARTMENT_OTHER): Payer: 59

## 2013-04-08 ENCOUNTER — Other Ambulatory Visit: Payer: 59

## 2013-04-08 ENCOUNTER — Ambulatory Visit (HOSPITAL_BASED_OUTPATIENT_CLINIC_OR_DEPARTMENT_OTHER): Payer: 59

## 2013-04-08 VITALS — BP 102/65 | Temp 99.1°F

## 2013-04-08 DIAGNOSIS — C9001 Multiple myeloma in remission: Secondary | ICD-10-CM

## 2013-04-08 DIAGNOSIS — Z95828 Presence of other vascular implants and grafts: Secondary | ICD-10-CM

## 2013-04-08 DIAGNOSIS — D001 Carcinoma in situ of esophagus: Secondary | ICD-10-CM

## 2013-04-08 DIAGNOSIS — C9002 Multiple myeloma in relapse: Secondary | ICD-10-CM

## 2013-04-08 DIAGNOSIS — Z452 Encounter for adjustment and management of vascular access device: Secondary | ICD-10-CM

## 2013-04-08 LAB — COMPREHENSIVE METABOLIC PANEL (CC13)
ALT: 19 U/L (ref 0–55)
AST: 17 U/L (ref 5–34)
Albumin: 3.9 g/dL (ref 3.5–5.0)
Alkaline Phosphatase: 39 U/L — ABNORMAL LOW (ref 40–150)
Anion Gap: 10 mEq/L (ref 3–11)
BUN: 18.4 mg/dL (ref 7.0–26.0)
CO2: 26 mEq/L (ref 22–29)
CREATININE: 0.8 mg/dL (ref 0.6–1.1)
Calcium: 9 mg/dL (ref 8.4–10.4)
Chloride: 107 mEq/L (ref 98–109)
Glucose: 94 mg/dl (ref 70–140)
Potassium: 3.6 mEq/L (ref 3.5–5.1)
Sodium: 143 mEq/L (ref 136–145)
Total Bilirubin: 0.59 mg/dL (ref 0.20–1.20)
Total Protein: 5.8 g/dL — ABNORMAL LOW (ref 6.4–8.3)

## 2013-04-08 LAB — CBC WITH DIFFERENTIAL/PLATELET
BASO%: 2.3 % — AB (ref 0.0–2.0)
Basophils Absolute: 0.1 10*3/uL (ref 0.0–0.1)
EOS%: 1 % (ref 0.0–7.0)
Eosinophils Absolute: 0 10*3/uL (ref 0.0–0.5)
HEMATOCRIT: 37.1 % (ref 34.8–46.6)
HGB: 12.5 g/dL (ref 11.6–15.9)
LYMPH%: 37.3 % (ref 14.0–49.7)
MCH: 34.6 pg — AB (ref 25.1–34.0)
MCHC: 33.6 g/dL (ref 31.5–36.0)
MCV: 103 fL — ABNORMAL HIGH (ref 79.5–101.0)
MONO#: 0.7 10*3/uL (ref 0.1–0.9)
MONO%: 18.8 % — ABNORMAL HIGH (ref 0.0–14.0)
NEUT#: 1.4 10*3/uL — ABNORMAL LOW (ref 1.5–6.5)
NEUT%: 40.6 % (ref 38.4–76.8)
PLATELETS: 127 10*3/uL — AB (ref 145–400)
RBC: 3.61 10*6/uL — ABNORMAL LOW (ref 3.70–5.45)
RDW: 14.3 % (ref 11.2–14.5)
WBC: 3.6 10*3/uL — ABNORMAL LOW (ref 3.9–10.3)
lymph#: 1.3 10*3/uL (ref 0.9–3.3)

## 2013-04-08 MED ORDER — SODIUM CHLORIDE 0.9 % IJ SOLN
10.0000 mL | INTRAMUSCULAR | Status: DC | PRN
  Administered 2013-04-08: 10 mL via INTRAVENOUS
  Filled 2013-04-08: qty 10

## 2013-04-08 MED ORDER — HEPARIN SOD (PORK) LOCK FLUSH 100 UNIT/ML IV SOLN
500.0000 [IU] | Freq: Once | INTRAVENOUS | Status: AC
  Administered 2013-04-08: 500 [IU] via INTRAVENOUS
  Filled 2013-04-08: qty 5

## 2013-04-08 NOTE — Patient Instructions (Signed)

## 2013-04-25 LAB — UIFE/LIGHT CHAINS/TP QN, 24-HR UR
Albumin, U: DETECTED
FREE KAPPA LT CHAINS, UR: 0.27 mg/dL (ref 0.14–2.42)
FREE LT CHN EXCR RATE: 5.4 mg/d
Free Kappa/Lambda Ratio: 9 ratio (ref 2.04–10.37)
Free Lambda Excretion/Day: 0.6 mg/d
Free Lambda Lt Chains,Ur: <0.03 mg/dL (ref 0.02–0.67)
TOTAL PROTEIN, URINE-UR/DAY: 16 mg/d (ref 10–140)
Time: 24 hours
Total Protein, Urine: 0.8 mg/dL
VOLUME, URINE-UPE24: 2000 mL

## 2013-04-26 ENCOUNTER — Telehealth: Payer: Self-pay | Admitting: *Deleted

## 2013-04-26 NOTE — Telephone Encounter (Signed)
Message copied by Jesse Fall on Tue Apr 26, 2013  3:42 PM ------      Message from: Annia Belt      Created: Tue Apr 26, 2013 10:21 AM       Call pt: no abnormal antibody protein in urine ------

## 2013-04-26 NOTE — Telephone Encounter (Signed)
Pt returned call & informed that she had no abnormal antibody protein in her urine.  She was very appreciative of call.

## 2013-04-28 ENCOUNTER — Other Ambulatory Visit: Payer: Self-pay | Admitting: *Deleted

## 2013-04-28 DIAGNOSIS — C9000 Multiple myeloma not having achieved remission: Secondary | ICD-10-CM

## 2013-04-28 MED ORDER — LENALIDOMIDE 10 MG PO CAPS: ORAL_CAPSULE | ORAL | Status: DC

## 2013-04-29 ENCOUNTER — Ambulatory Visit (HOSPITAL_BASED_OUTPATIENT_CLINIC_OR_DEPARTMENT_OTHER): Payer: 59

## 2013-04-29 ENCOUNTER — Ambulatory Visit (HOSPITAL_BASED_OUTPATIENT_CLINIC_OR_DEPARTMENT_OTHER): Payer: 59 | Admitting: Nurse Practitioner

## 2013-04-29 ENCOUNTER — Other Ambulatory Visit (HOSPITAL_BASED_OUTPATIENT_CLINIC_OR_DEPARTMENT_OTHER): Payer: 59

## 2013-04-29 ENCOUNTER — Telehealth: Payer: Self-pay | Admitting: Oncology

## 2013-04-29 VITALS — BP 123/77 | HR 88 | Temp 97.3°F | Resp 19 | Ht 59.0 in | Wt 123.9 lb

## 2013-04-29 DIAGNOSIS — C9002 Multiple myeloma in relapse: Secondary | ICD-10-CM

## 2013-04-29 DIAGNOSIS — C9001 Multiple myeloma in remission: Secondary | ICD-10-CM

## 2013-04-29 DIAGNOSIS — D696 Thrombocytopenia, unspecified: Secondary | ICD-10-CM

## 2013-04-29 DIAGNOSIS — R609 Edema, unspecified: Secondary | ICD-10-CM

## 2013-04-29 LAB — CBC WITH DIFFERENTIAL/PLATELET
BASO%: 1.7 % (ref 0.0–2.0)
Basophils Absolute: 0.1 10*3/uL (ref 0.0–0.1)
EOS ABS: 0.2 10*3/uL (ref 0.0–0.5)
EOS%: 4.9 % (ref 0.0–7.0)
HCT: 35.2 % (ref 34.8–46.6)
HEMOGLOBIN: 12 g/dL (ref 11.6–15.9)
LYMPH%: 29.8 % (ref 14.0–49.7)
MCH: 34.2 pg — ABNORMAL HIGH (ref 25.1–34.0)
MCHC: 34.1 g/dL (ref 31.5–36.0)
MCV: 100.3 fL (ref 79.5–101.0)
MONO#: 0.7 10*3/uL (ref 0.1–0.9)
MONO%: 15.5 % — ABNORMAL HIGH (ref 0.0–14.0)
NEUT#: 2.2 10*3/uL (ref 1.5–6.5)
NEUT%: 48.1 % (ref 38.4–76.8)
NRBC: 0 % (ref 0–0)
Platelets: 109 10*3/uL — ABNORMAL LOW (ref 145–400)
RBC: 3.51 10*6/uL — AB (ref 3.70–5.45)
RDW: 14.1 % (ref 11.2–14.5)
WBC: 4.7 10*3/uL (ref 3.9–10.3)
lymph#: 1.4 10*3/uL (ref 0.9–3.3)

## 2013-04-29 LAB — COMPREHENSIVE METABOLIC PANEL (CC13)
ALK PHOS: 40 U/L (ref 40–150)
ALT: 14 U/L (ref 0–55)
AST: 17 U/L (ref 5–34)
Albumin: 3.9 g/dL (ref 3.5–5.0)
Anion Gap: 8 mEq/L (ref 3–11)
BILIRUBIN TOTAL: 0.4 mg/dL (ref 0.20–1.20)
BUN: 14 mg/dL (ref 7.0–26.0)
CO2: 25 mEq/L (ref 22–29)
Calcium: 8.6 mg/dL (ref 8.4–10.4)
Chloride: 107 mEq/L (ref 98–109)
Creatinine: 0.7 mg/dL (ref 0.6–1.1)
Glucose: 83 mg/dl (ref 70–140)
Potassium: 3.7 mEq/L (ref 3.5–5.1)
SODIUM: 140 meq/L (ref 136–145)
TOTAL PROTEIN: 5.6 g/dL — AB (ref 6.4–8.3)

## 2013-04-29 MED ORDER — SODIUM CHLORIDE 0.9 % IJ SOLN
10.0000 mL | INTRAMUSCULAR | Status: DC | PRN
  Administered 2013-04-29: 10 mL
  Filled 2013-04-29: qty 10

## 2013-04-29 MED ORDER — HEPARIN SOD (PORK) LOCK FLUSH 100 UNIT/ML IV SOLN
500.0000 [IU] | Freq: Once | INTRAVENOUS | Status: AC | PRN
  Administered 2013-04-29: 500 [IU]
  Filled 2013-04-29: qty 5

## 2013-04-29 MED ORDER — ZOLEDRONIC ACID 4 MG/100ML IV SOLN
4.0000 mg | Freq: Once | INTRAVENOUS | Status: AC
  Administered 2013-04-29: 4 mg via INTRAVENOUS
  Filled 2013-04-29: qty 100

## 2013-04-29 MED ORDER — SODIUM CHLORIDE 0.9 % IV SOLN
Freq: Once | INTRAVENOUS | Status: AC
  Administered 2013-04-29: 13:00:00 via INTRAVENOUS

## 2013-04-29 NOTE — Patient Instructions (Signed)

## 2013-04-29 NOTE — Telephone Encounter (Signed)
Gave pt appt for lab and MD for March and 2015

## 2013-04-29 NOTE — Progress Notes (Signed)
OFFICE PROGRESS NOTE  Interval history:   Jessica Cochran is a 53 year old woman with relapsed kappa light chain multiple myeloma. She was started back on treatment with Revlimid 25 mg 3 weeks on/1 week off plus dexamethasone 40 mg weekly beginning 10/20/2011. The Revlimid dose was adjusted to a maintenance dose of 10 mg daily with continuation of dexamethasone 40 mg weekly beginning approximately 03/07/2012. Serum light chain analysis from 10/21/2012 was stable. 24-hour urine done 11/04/2012 showed no monoclonal free light chains. The Revlimid schedule was adjusted to 10 mg 3 weeks on/1 week off.  Most recent serum light chain analysis 02/25/2013 was stable with kappa free light chains 0.18, lambda 0.63 and ratio 0.29. 24-hour urine 04/21/2013 showed 16 mg of total protein excretion. No monoclonal free light chains.  She overall feels well. She had a recent cold. Symptoms have resolved. She continues to have a good appetite. She reports her weight is stable. She has stable chronic back pain. She continues OxyContin with oxycodone as needed. No nausea or vomiting. No mouth sores. No diarrhea. She is intermittently constipated. She takes Colace as needed. She occasionally notes a tingling sensation in the hands.  Objective: Filed Vitals:   04/29/13 1144  BP: 123/77  Pulse: 88  Temp: 97.3 F (36.3 C)  Resp: 19   Oropharynx is without thrush or ulceration. No palpable cervical or supraclavicular lymph nodes. Lungs are clear. No wheezes or rales. Regular cardiac rhythm. No murmur. Abdomen is soft and nontender. No organomegaly. Trace lower leg edema bilaterally. Calves soft and nontender. Motor strength 5 over 5. Knee DTRs 2+, symmetric. Vibratory sense intact over the fingertips per tuning fork exam. Skin is dry. No rash.   Lab Results: Lab Results  Component Value Date   WBC 4.7 04/29/2013   HGB 12.0 04/29/2013   HCT 35.2 04/29/2013   MCV 100.3 04/29/2013   PLT 109* 04/29/2013   NEUTROABS 2.2  04/29/2013    Chemistry:    Chemistry      Component Value Date/Time   NA 143 04/08/2013 1125   NA 143 10/10/2011 1023   K 3.6 04/08/2013 1125   K 3.8 10/10/2011 1023   CL 107 08/30/2012 1055   CL 105 10/10/2011 1023   CO2 26 04/08/2013 1125   CO2 29 10/10/2011 1023   BUN 18.4 04/08/2013 1125   BUN 16 10/10/2011 1023   CREATININE 0.8 04/08/2013 1125   CREATININE 0.83 10/10/2011 1023   CREATININE 0.75 07/17/2009 1541      Component Value Date/Time   CALCIUM 9.0 04/08/2013 1125   CALCIUM 9.3 10/10/2011 1023   ALKPHOS 39* 04/08/2013 1125   ALKPHOS 54 08/19/2011 1353   AST 17 04/08/2013 1125   AST 23 08/19/2011 1353   ALT 19 04/08/2013 1125   ALT 11 08/19/2011 1353   BILITOT 0.59 04/08/2013 1125   BILITOT 0.3 08/19/2011 1353       Studies/Results: No results found.  Medications: I have reviewed the patient's current medications.  Assessment/Plan: 1. Kappa light chain myeloma initially diagnosed May 2009 at which time she presented with severe back pain, hypercalcemia, anemia and proteinuria. She had 16 g of protein in her urine. Initial creatinine of 2. She underwent a lumbar vertebroplasty for a pathologic fracture. She was treated with pulse high dose dexamethasone and subsequently induced into a remission with a combination of Revlimid/Velcade/dexamethasone. She went on to receive high-dose IV melphalan with autologous stem cell support at [facility] on 12/30/2007. Routine survey laboratory studies done  08/19/2011 showed a rise the serum kappa free light chains. Bone marrow aspiration and biopsy on 09/25/2011 showed 4% plasma cells. No large aggregates or sheets of plasma cells. Immunohistochemical staining showed positive polyclonal staining pattern although there was kappa light chain excess "most suggestive of subtle/early involvement by plasma cell dyscrasia". Cytogenetic analysis was normal. She began Revlimid 25 mg daily 3 weeks on/1 week off and dexamethasone 40 mg weekly beginning 10/20/2011  with a good response. She began a maintenance dose of Revlimid 10 mg daily with continuation of dexamethasone 40 mg weekly around 03/07/2012. Revlimid schedule further adjusted to 10 mg 3 weeks on/1 week off following office visit 10/30/2012.  2. History of recurrent herpes zoster infections. She continues prophylactic Valtrex. 3. Status post initial lumbar vertebroplasty for pathologic fracture related to myeloma at diagnosis 4 years ago. She has stable chronic back pain. 4. Mild thrombocytopenia. Stable. 5. Asymmetric leg edema right greater than left noted 01/05/2012. Bilateral lower extremity venous Doppler negative for DVT.   Dispositon-Jessica Cochran appears stable. She will continue Revlimid 10 mg 3 weeks on/1 week off. She receives Zometa every 6 months and is scheduled for an infusion today. We will continue every 4 week labs. She will return for a followup visit in 8 weeks with Dr. Alvy Bimler.  Plan reviewed with Dr. Beryle Beams.   Ned Card ANP/GNP-BC

## 2013-05-03 LAB — KAPPA/LAMBDA LIGHT CHAINS
KAPPA FREE LGHT CHN: 0.13 mg/dL — AB (ref 0.33–1.94)
KAPPA LAMBDA RATIO: 0.22 — AB (ref 0.26–1.65)
Lambda Free Lght Chn: 0.59 mg/dL (ref 0.57–2.63)

## 2013-05-07 ENCOUNTER — Encounter: Payer: Self-pay | Admitting: Oncology

## 2013-05-20 ENCOUNTER — Other Ambulatory Visit: Payer: Self-pay | Admitting: Oncology

## 2013-05-20 DIAGNOSIS — C9001 Multiple myeloma in remission: Secondary | ICD-10-CM

## 2013-05-26 ENCOUNTER — Other Ambulatory Visit: Payer: Self-pay | Admitting: Hematology and Oncology

## 2013-05-26 ENCOUNTER — Other Ambulatory Visit: Payer: Self-pay | Admitting: *Deleted

## 2013-05-26 DIAGNOSIS — C9000 Multiple myeloma not having achieved remission: Secondary | ICD-10-CM

## 2013-05-26 DIAGNOSIS — C9001 Multiple myeloma in remission: Secondary | ICD-10-CM

## 2013-05-26 DIAGNOSIS — C9002 Multiple myeloma in relapse: Secondary | ICD-10-CM

## 2013-05-26 MED ORDER — VALACYCLOVIR HCL 500 MG PO TABS: ORAL_TABLET | ORAL | Status: DC

## 2013-05-26 MED ORDER — LENALIDOMIDE 10 MG PO CAPS: ORAL_CAPSULE | ORAL | Status: DC

## 2013-05-27 ENCOUNTER — Other Ambulatory Visit (HOSPITAL_BASED_OUTPATIENT_CLINIC_OR_DEPARTMENT_OTHER): Payer: 59

## 2013-05-27 DIAGNOSIS — C9001 Multiple myeloma in remission: Secondary | ICD-10-CM

## 2013-05-27 DIAGNOSIS — C9000 Multiple myeloma not having achieved remission: Secondary | ICD-10-CM

## 2013-05-27 LAB — COMPREHENSIVE METABOLIC PANEL (CC13)
ALBUMIN: 3.8 g/dL (ref 3.5–5.0)
ALT: 13 U/L (ref 0–55)
AST: 22 U/L (ref 5–34)
Alkaline Phosphatase: 47 U/L (ref 40–150)
Anion Gap: 8 mEq/L (ref 3–11)
BUN: 8.6 mg/dL (ref 7.0–26.0)
CALCIUM: 8.5 mg/dL (ref 8.4–10.4)
CHLORIDE: 108 meq/L (ref 98–109)
CO2: 26 meq/L (ref 22–29)
Creatinine: 0.8 mg/dL (ref 0.6–1.1)
GLUCOSE: 93 mg/dL (ref 70–140)
POTASSIUM: 4 meq/L (ref 3.5–5.1)
Sodium: 142 mEq/L (ref 136–145)
TOTAL PROTEIN: 5.7 g/dL — AB (ref 6.4–8.3)
Total Bilirubin: 0.37 mg/dL (ref 0.20–1.20)

## 2013-05-27 LAB — CBC WITH DIFFERENTIAL/PLATELET
BASO%: 2.3 % — AB (ref 0.0–2.0)
Basophils Absolute: 0.1 10*3/uL (ref 0.0–0.1)
EOS ABS: 0.2 10*3/uL (ref 0.0–0.5)
EOS%: 7.2 % — ABNORMAL HIGH (ref 0.0–7.0)
HCT: 37 % (ref 34.8–46.6)
HGB: 12.2 g/dL (ref 11.6–15.9)
LYMPH%: 32.5 % (ref 14.0–49.7)
MCH: 33.7 pg (ref 25.1–34.0)
MCHC: 32.8 g/dL (ref 31.5–36.0)
MCV: 102.5 fL — ABNORMAL HIGH (ref 79.5–101.0)
MONO#: 0.3 10*3/uL (ref 0.1–0.9)
MONO%: 12.7 % (ref 0.0–14.0)
NEUT#: 1.1 10*3/uL — ABNORMAL LOW (ref 1.5–6.5)
NEUT%: 45.3 % (ref 38.4–76.8)
Platelets: 90 10*3/uL — ABNORMAL LOW (ref 145–400)
RBC: 3.61 10*6/uL — AB (ref 3.70–5.45)
RDW: 15 % — AB (ref 11.2–14.5)
WBC: 2.4 10*3/uL — AB (ref 3.9–10.3)
lymph#: 0.8 10*3/uL — ABNORMAL LOW (ref 0.9–3.3)

## 2013-06-17 ENCOUNTER — Other Ambulatory Visit: Payer: 59

## 2013-06-17 ENCOUNTER — Ambulatory Visit (HOSPITAL_BASED_OUTPATIENT_CLINIC_OR_DEPARTMENT_OTHER): Payer: 59

## 2013-06-17 VITALS — BP 127/78 | HR 102 | Temp 98.2°F

## 2013-06-17 DIAGNOSIS — C9001 Multiple myeloma in remission: Secondary | ICD-10-CM

## 2013-06-17 DIAGNOSIS — Z95828 Presence of other vascular implants and grafts: Secondary | ICD-10-CM

## 2013-06-17 DIAGNOSIS — Z452 Encounter for adjustment and management of vascular access device: Secondary | ICD-10-CM

## 2013-06-17 MED ORDER — SODIUM CHLORIDE 0.9 % IJ SOLN
10.0000 mL | INTRAMUSCULAR | Status: DC | PRN
Start: 2013-06-17 — End: 2013-06-17
  Administered 2013-06-17: 10 mL via INTRAVENOUS
  Filled 2013-06-17: qty 10

## 2013-06-17 MED ORDER — HEPARIN SOD (PORK) LOCK FLUSH 100 UNIT/ML IV SOLN
500.0000 [IU] | Freq: Once | INTRAVENOUS | Status: AC
  Administered 2013-06-17: 500 [IU] via INTRAVENOUS
  Filled 2013-06-17: qty 5

## 2013-06-20 LAB — IGG, IGA, IGM
IGA: 14 mg/dL — AB (ref 69–380)
IgG (Immunoglobin G), Serum: 359 mg/dL — ABNORMAL LOW (ref 690–1700)
IgM, Serum: 14 mg/dL — ABNORMAL LOW (ref 52–322)

## 2013-06-20 LAB — KAPPA/LAMBDA LIGHT CHAINS
KAPPA FREE LGHT CHN: 0.79 mg/dL (ref 0.33–1.94)
Kappa:Lambda Ratio: 0.68 (ref 0.26–1.65)
LAMBDA FREE LGHT CHN: 1.16 mg/dL (ref 0.57–2.63)

## 2013-06-23 ENCOUNTER — Other Ambulatory Visit: Payer: Self-pay | Admitting: *Deleted

## 2013-06-23 DIAGNOSIS — C9000 Multiple myeloma not having achieved remission: Secondary | ICD-10-CM

## 2013-06-23 MED ORDER — LENALIDOMIDE 10 MG PO CAPS: ORAL_CAPSULE | ORAL | Status: DC

## 2013-06-24 ENCOUNTER — Telehealth: Payer: Self-pay | Admitting: Hematology and Oncology

## 2013-06-24 ENCOUNTER — Ambulatory Visit (HOSPITAL_BASED_OUTPATIENT_CLINIC_OR_DEPARTMENT_OTHER): Payer: 59 | Admitting: Hematology and Oncology

## 2013-06-24 ENCOUNTER — Encounter: Payer: Self-pay | Admitting: Hematology and Oncology

## 2013-06-24 VITALS — BP 115/73 | HR 70 | Temp 97.9°F | Resp 18 | Ht 59.0 in | Wt 123.2 lb

## 2013-06-24 DIAGNOSIS — D61818 Other pancytopenia: Secondary | ICD-10-CM

## 2013-06-24 DIAGNOSIS — C9001 Multiple myeloma in remission: Secondary | ICD-10-CM

## 2013-06-24 DIAGNOSIS — C9000 Multiple myeloma not having achieved remission: Secondary | ICD-10-CM

## 2013-06-24 NOTE — Progress Notes (Signed)
[facility] FOLLOW-UP progress notes  Patient Care Team: Vidal Schwalbe, MD as PCP - General Annia Belt, MD as Consulting Physician (Oncology) [name], MD as Consulting Physician (Oncology) Floyce Stakes, MD as Consulting Physician (Neurosurgery)  CHIEF COMPLAINTS/PURPOSE OF VISIT:   Kappa light chain multiple myeloma, on maintenance therapy with Revlimid HISTORY OF PRESENTING ILLNESS:  Jessica Cochran 53 y.o. female was transferred to my care after her prior physician has left.  I reviewed the patient's records extensive and collaborated the history with the patient. Summary of her history is as follows:  She was diagnosed with kappa light chain multiple myeloma in May 2009 when she presented with back pain, anemia, hypercalcemia, and proteinuria. Briefly, she received induction therapy with RVD and achieved an excellent response. She went on to receive high-dose IV melphalan with autologous stem cell support in October 2009 at [facility]. She received monthly Zometa for the first 2 years and then changed to an every 6 month schedule.  She showed signs of early progression in June 2013 with rising kappa free light chains in the serum and appearance of monoclonal proteins in the urine. Bone marrow aspirate and biopsy done 09/25/2011 showed only 4% plasma cells. Negative cytogenetic studies. She was put her on treatment with a combination of Revlimid 25 mg 3 weeks on one-week off plus dexamethasone 40 mg by mouth weekly beginning in August 2013. She had a nice response with fall in her paraprotein levels back to normal and in January 2014, with modified schedule by deleting the steroids and dropping the Revlimid down to a single 10 mg daily dose. At her request, prior hematologist further decreased her Revlimid to 3 weeks on 1 week off.  She is receiving Zometa on an every six-month basis.   She has had no interim fevers or infection. She  denies any new bone pain. She still complaining of persistent back pain and occasional numbness and tingling in the tips of fingers.  MEDICAL HISTORY:  Past Medical History  Diagnosis Date  . Herpes zoster infection 04/29/2011  . Renal stone   . Multiple myeloma in remission 04/29/2011  . Arthritis   . Multiple myeloma in relapse 10/12/2011    08/19/11: increase in kappa serum free light chains; 7/11: elevation urine protein (157 mg/24 hr) and reappearance monoclonal kappa light chains; 7/18: bone marrow biopsy 4% plasma cells; bone X-rays: no new lesions    SURGICAL HISTORY: Past Surgical History  Procedure Laterality Date  . Pilonidal cyst drainage  1991  . Uterine fibroid tumor removed      1990's  . Back surgery      2009  . Rectal abcess    . Stem cell transplant autologus      SOCIAL HISTORY: History   Social History  . Marital Status: Married    Spouse Name: N/A    Number of Children: N/A  . Years of Education: N/A   Occupational History  . Not on file.   Social History Main Topics  . Smoking status: Former Smoker -- 0.25 packs/day for 5 years  . Smokeless tobacco: Never Used  . Alcohol Use: No  . Drug Use: No  . Sexual Activity: Not on file   Other Topics Concern  . Not on file   Social History Narrative  . No narrative on file    FAMILY HISTORY: Family History  Problem Relation Age of Onset  . Cancer Sister  skin cancer (melanoma)    ALLERGIES:  has No Known Allergies.  MEDICATIONS:  Current Outpatient Prescriptions  Medication Sig Dispense Refill  . aspirin 81 MG tablet Take 81 mg by mouth daily.      Marland Kitchen lenalidomide (REVLIMID) 10 MG capsule One capsule daily x 21 days then 7 day rest & repeat cycle  21 capsule  0  . oxycodone (OXY-IR) 5 MG capsule Take 5 mg by mouth every 4 (four) hours as needed. pain      . Oxycodone HCl (OXYCONTIN) 60 MG TB12 Take 1 tablet by mouth 2 (two) times daily.      . pantoprazole (PROTONIX) 40 MG tablet Take 1  tablet (40 mg total) by mouth daily as needed.  30 tablet  3  . potassium chloride SA (K-DUR,KLOR-CON) 20 MEQ tablet PO  BID for one week then daily  60 tablet  4  . sulfamethoxazole-trimethoprim (BACTRIM DS) 800-160 MG per tablet TAKE 1 TABLET ON MONDAY, WEDNESDAY, AND FRIDAY.  12 tablet  1  . valACYclovir (VALTREX) 500 MG tablet TAKE 1 TABLET BY MOUTH DAILY.  30 tablet  3   No current facility-administered medications for this visit.    REVIEW OF SYSTEMS:   Constitutional: Denies fevers, chills or abnormal night sweats Eyes: Denies blurriness of vision, double vision or watery eyes Ears, nose, mouth, throat, and face: Denies mucositis or sore throat Respiratory: Denies cough, dyspnea or wheezes Cardiovascular: Denies palpitation, chest discomfort or lower extremity swelling Gastrointestinal:  Denies nausea, heartburn or change in bowel habits Skin: Denies abnormal skin rashes Lymphatics: Denies new lymphadenopathy or easy bruising Neurological:Denies numbness, tingling or new weaknesses Behavioral/Psych: Mood is stable, no new changes  All other systems were reviewed with the patient and are negative.  PHYSICAL EXAMINATION: ECOG PERFORMANCE STATUS: 1 - Symptomatic but completely ambulatory  Filed Vitals:   06/24/13 0946  BP: 115/73  Pulse: 70  Temp: 97.9 F (36.6 C)  Resp: 18   Filed Weights   06/24/13 0946  Weight: 123 lb 3.2 oz (55.883 kg)    GENERAL:alert, no distress and comfortable. She looks thin and mildly cachectic SKIN: skin color, texture, turgor are normal, no rashes or significant lesions EYES: normal, conjunctiva are pink and non-injected, sclera clear OROPHARYNX:no exudate, normal lips, buccal mucosa, and tongue  NECK: supple, thyroid normal size, non-tender, without nodularity LYMPH:  no palpable lymphadenopathy in the cervical, axillary or inguinal LUNGS: clear to auscultation and percussion with normal breathing effort HEART: regular rate & rhythm and no  murmurs without lower extremity edema ABDOMEN:abdomen soft, non-tender and normal bowel sounds Musculoskeletal:no cyanosis of digits and no clubbing  PSYCH: alert & oriented x 3 with fluent speech NEURO: no focal motor/sensory deficits  LABORATORY DATA:  I have reviewed the data as listed Lab Results  Component Value Date   WBC 2.4* 05/27/2013   HGB 12.2 05/27/2013   HCT 37.0 05/27/2013   MCV 102.5* 05/27/2013   PLT 90* 05/27/2013    Recent Labs  07/28/12 1130 08/05/12 1319 08/30/12 1055  04/08/13 1125 04/29/13 1106 05/27/13 1204  NA 142 142 139  < > 143 140 142  K 3.1* 4.0 3.7  < > 3.6 3.7 4.0  CL 106 104 107  --   --   --   --   CO2 28 30* 25  < > _0 GLUCOSE 82 79 102*  < > 94 83 93  BUN 12.8 12.5 10.7  < > 18.4 14.0 8.6  CREATININE 0.7 0.9 0.8  < > 0.8 0.7 0.8  CALCIUM 8.3* 8.9 8.4  < > 9.0 8.6 8.5  PROT 5.6*  --  5.7*  < > 5.8* 5.6* 5.7*  ALBUMIN 3.6  --  3.5  < > 3.9 3.9 3.8  AST 33  --  19  < > _0 ALT 45  --  14  < > _1 ALKPHOS 35*  --  45  < > 39* 40 47  BILITOT 0.70  --  0.43  < > 0.59 0.40 0.37  < > = values in this interval not displayed.  ASSESSMENT & PLAN:  #1 multiple myeloma She is responding to treatment. Her most recent serum light chains were within normal limits. I recommend we continue the same treatment for now #2 mild pancytopenia Her last blood work done showed pancytopenia but she was on treatment at that time. She is currently off treatment. I recommend scheduling a lab appointment next week to reassess her blood count before she restarts another round of treatment. I plan to see her back in 5 weeks for further evaluation #3 History of compression fracture I recommend vitamin D and calcium supplements  Orders Placed This Encounter  Procedures  . CBC with Differential    Standing Status: Standing     Number of Occurrences: 2     Standing Expiration Date: 06/25/2014  . Comprehensive metabolic panel    Standing Status:  Future     Number of Occurrences:      Standing Expiration Date: 06/25/2014    Order Specific Question:  Has the patient fasted?    Answer:  No    All questions were answered. The patient knows to call the clinic with any problems, questions or concerns. I spent 25 minutes counseling the patient face to face. The total time spent in the appointment was 30 minutes and more than 50% was on counseling.     [name], MD 06/24/2013 4:52 PM

## 2013-06-24 NOTE — Telephone Encounter (Signed)
Gave pt appt for lab and MD and flush for APril and May 2015

## 2013-06-29 ENCOUNTER — Other Ambulatory Visit (HOSPITAL_BASED_OUTPATIENT_CLINIC_OR_DEPARTMENT_OTHER): Payer: 59

## 2013-06-29 DIAGNOSIS — C9000 Multiple myeloma not having achieved remission: Secondary | ICD-10-CM

## 2013-06-29 DIAGNOSIS — D61818 Other pancytopenia: Secondary | ICD-10-CM

## 2013-06-29 DIAGNOSIS — C9001 Multiple myeloma in remission: Secondary | ICD-10-CM

## 2013-06-29 LAB — CBC WITH DIFFERENTIAL/PLATELET
BASO%: 2.5 % — AB (ref 0.0–2.0)
Basophils Absolute: 0.1 10*3/uL (ref 0.0–0.1)
EOS%: 2.5 % (ref 0.0–7.0)
Eosinophils Absolute: 0.1 10*3/uL (ref 0.0–0.5)
HEMATOCRIT: 38.2 % (ref 34.8–46.6)
HGB: 12.7 g/dL (ref 11.6–15.9)
LYMPH%: 44.3 % (ref 14.0–49.7)
MCH: 33.8 pg (ref 25.1–34.0)
MCHC: 33.2 g/dL (ref 31.5–36.0)
MCV: 102 fL — AB (ref 79.5–101.0)
MONO#: 0.3 10*3/uL (ref 0.1–0.9)
MONO%: 9.4 % (ref 0.0–14.0)
NEUT#: 1.2 10*3/uL — ABNORMAL LOW (ref 1.5–6.5)
NEUT%: 41.3 % (ref 38.4–76.8)
PLATELETS: 144 10*3/uL — AB (ref 145–400)
RBC: 3.75 10*6/uL (ref 3.70–5.45)
RDW: 14.4 % (ref 11.2–14.5)
WBC: 2.9 10*3/uL — ABNORMAL LOW (ref 3.9–10.3)
lymph#: 1.3 10*3/uL (ref 0.9–3.3)

## 2013-07-19 ENCOUNTER — Other Ambulatory Visit: Payer: Self-pay | Admitting: *Deleted

## 2013-07-19 DIAGNOSIS — C9000 Multiple myeloma not having achieved remission: Secondary | ICD-10-CM

## 2013-07-19 MED ORDER — LENALIDOMIDE 10 MG PO CAPS: ORAL_CAPSULE | ORAL | Status: DC

## 2013-07-27 ENCOUNTER — Other Ambulatory Visit: Payer: 59

## 2013-07-27 ENCOUNTER — Telehealth: Payer: Self-pay | Admitting: Hematology and Oncology

## 2013-07-27 ENCOUNTER — Other Ambulatory Visit (HOSPITAL_BASED_OUTPATIENT_CLINIC_OR_DEPARTMENT_OTHER): Payer: 59

## 2013-07-27 ENCOUNTER — Ambulatory Visit (HOSPITAL_BASED_OUTPATIENT_CLINIC_OR_DEPARTMENT_OTHER): Payer: 59 | Admitting: Hematology and Oncology

## 2013-07-27 ENCOUNTER — Ambulatory Visit (HOSPITAL_BASED_OUTPATIENT_CLINIC_OR_DEPARTMENT_OTHER): Payer: 59

## 2013-07-27 ENCOUNTER — Encounter: Payer: Self-pay | Admitting: Hematology and Oncology

## 2013-07-27 VITALS — BP 120/76 | HR 86 | Temp 98.0°F | Resp 20 | Ht 59.0 in | Wt 121.7 lb

## 2013-07-27 DIAGNOSIS — D801 Nonfamilial hypogammaglobulinemia: Secondary | ICD-10-CM

## 2013-07-27 DIAGNOSIS — C9001 Multiple myeloma in remission: Secondary | ICD-10-CM

## 2013-07-27 DIAGNOSIS — Z95828 Presence of other vascular implants and grafts: Secondary | ICD-10-CM

## 2013-07-27 DIAGNOSIS — C9000 Multiple myeloma not having achieved remission: Secondary | ICD-10-CM

## 2013-07-27 DIAGNOSIS — D61818 Other pancytopenia: Secondary | ICD-10-CM

## 2013-07-27 DIAGNOSIS — Z452 Encounter for adjustment and management of vascular access device: Secondary | ICD-10-CM

## 2013-07-27 LAB — COMPREHENSIVE METABOLIC PANEL (CC13)
ALT: 14 U/L (ref 0–55)
AST: 23 U/L (ref 5–34)
Albumin: 4.1 g/dL (ref 3.5–5.0)
Alkaline Phosphatase: 61 U/L (ref 40–150)
Anion Gap: 10 mEq/L (ref 3–11)
BILIRUBIN TOTAL: 0.29 mg/dL (ref 0.20–1.20)
BUN: 12.8 mg/dL (ref 7.0–26.0)
CALCIUM: 8.7 mg/dL (ref 8.4–10.4)
CHLORIDE: 107 meq/L (ref 98–109)
CO2: 24 mEq/L (ref 22–29)
Creatinine: 0.8 mg/dL (ref 0.6–1.1)
Glucose: 88 mg/dl (ref 70–140)
Potassium: 4.2 mEq/L (ref 3.5–5.1)
Sodium: 141 mEq/L (ref 136–145)
Total Protein: 6 g/dL — ABNORMAL LOW (ref 6.4–8.3)

## 2013-07-27 LAB — CBC WITH DIFFERENTIAL/PLATELET
BASO%: 0.7 % (ref 0.0–2.0)
Basophils Absolute: 0 10*3/uL (ref 0.0–0.1)
EOS%: 2 % (ref 0.0–7.0)
Eosinophils Absolute: 0.1 10*3/uL (ref 0.0–0.5)
HCT: 36.9 % (ref 34.8–46.6)
HGB: 12.3 g/dL (ref 11.6–15.9)
LYMPH#: 1.2 10*3/uL (ref 0.9–3.3)
LYMPH%: 35.4 % (ref 14.0–49.7)
MCH: 34.1 pg — AB (ref 25.1–34.0)
MCHC: 33.5 g/dL (ref 31.5–36.0)
MCV: 101.9 fL — ABNORMAL HIGH (ref 79.5–101.0)
MONO#: 0.4 10*3/uL (ref 0.1–0.9)
MONO%: 10.7 % (ref 0.0–14.0)
NEUT#: 1.7 10*3/uL (ref 1.5–6.5)
NEUT%: 51.2 % (ref 38.4–76.8)
Platelets: 127 10*3/uL — ABNORMAL LOW (ref 145–400)
RBC: 3.62 10*6/uL — ABNORMAL LOW (ref 3.70–5.45)
RDW: 13.9 % (ref 11.2–14.5)
WBC: 3.4 10*3/uL — ABNORMAL LOW (ref 3.9–10.3)

## 2013-07-27 MED ORDER — SODIUM CHLORIDE 0.9 % IJ SOLN
10.0000 mL | INTRAMUSCULAR | Status: DC | PRN
  Administered 2013-07-27: 10 mL via INTRAVENOUS
  Filled 2013-07-27: qty 10

## 2013-07-27 MED ORDER — HEPARIN SOD (PORK) LOCK FLUSH 100 UNIT/ML IV SOLN
500.0000 [IU] | Freq: Once | INTRAVENOUS | Status: AC
  Administered 2013-07-27: 500 [IU] via INTRAVENOUS
  Filled 2013-07-27: qty 5

## 2013-07-27 NOTE — Progress Notes (Signed)
[place] OFFICE PROGRESS NOTE  Patient Care Team: Vidal Schwalbe, MD as PCP - General Annia Belt, MD as Consulting Physician (Oncology) [name], MD as Consulting Physician (Oncology) Floyce Stakes, MD as Consulting Physician (Neurosurgery)  DIAGNOSIS: Kappa light chain multiple myeloma, on observation  SUMMARY OF ONCOLOGIC HISTORY:  She was diagnosed with kappa light chain multiple myeloma in May 2009 when she presented with back pain, anemia, hypercalcemia, and proteinuria. Briefly, she received induction therapy with RVD and achieved an excellent response. She went on to receive high-dose IV melphalan with autologous stem cell support in October 2009 at [facility]. She received monthly Zometa for the first 2 years and then changed to an every 6 month schedule.  She showed signs of early progression in June 2013 with rising kappa free light chains in the serum and appearance of monoclonal proteins in the urine. Bone marrow aspirate and biopsy done 09/25/2011 showed only 4% plasma cells. Negative cytogenetic studies. She was put her on treatment with a combination of Revlimid 25 mg 3 weeks on one-week off plus dexamethasone 40 mg by mouth weekly beginning in August 2013. She had a nice response with fall in her paraprotein levels back to normal and in January 2014, with modified schedule by deleting the steroids and dropping the Revlimid down to a single 10 mg daily dose. At her request, prior hematologist further decreased her Revlimid to 3 weeks on 1 week off.  She was receiving Zometa on an every six-month basis.  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 She made an informed decision to stop Revlimid and go further followup.  INTERVAL HISTORY: Jessica Cochran 53 y.o. female returns for further followup. She feels well. She is to have intermittent back pain. She is compliant  taking calcium with vitamin D. Denies any recent infection. Her energy level has improved. on observation only.  I have reviewed the past medical history, past surgical history, social history and family history with the patient and they are unchanged from previous note.  ALLERGIES:  has No Known Allergies.  MEDICATIONS:  Current Outpatient Prescriptions  Medication Sig Dispense Refill  . cholecalciferol (VITAMIN D) 1000 UNITS tablet Take 1,000 Units by mouth daily.      . Multiple Vitamins-Minerals (ONE-A-DAY 50 PLUS PO) Take by mouth daily.      Marland Kitchen oxycodone (OXY-IR) 5 MG capsule Take 5 mg by mouth every 4 (four) hours as needed. pain      . Oxycodone HCl (OXYCONTIN) 60 MG TB12 Take 1 tablet by mouth 2 (two) times daily.      . pantoprazole (PROTONIX) 40 MG tablet Take 1 tablet (40 mg total) by mouth daily as needed.  30 tablet  3  . potassium chloride SA (K-DUR,KLOR-CON) 20 MEQ tablet PO  BID for one week then daily  60 tablet  4  . sulfamethoxazole-trimethoprim (BACTRIM DS) 800-160 MG per tablet TAKE 1 TABLET ON MONDAY, WEDNESDAY, AND FRIDAY.  12 tablet  1  . valACYclovir (VALTREX) 500 MG tablet TAKE 1 TABLET BY MOUTH DAILY.  30 tablet  3   No current facility-administered medications for this visit.    REVIEW OF SYSTEMS:   Constitutional: Denies fevers, chills or abnormal weight loss Eyes: Denies blurriness of vision Ears, nose, mouth, throat, and face: Denies mucositis or sore throat Respiratory: Denies cough, dyspnea or wheezes Cardiovascular: Denies palpitation, chest discomfort or lower extremity swelling Gastrointestinal:  Denies nausea, heartburn or change in bowel habits Skin: Denies abnormal skin rashes Lymphatics: Denies new lymphadenopathy or easy bruising Neurological:Denies numbness, tingling or new weaknesses Behavioral/Psych: Mood is stable, no new changes  All other systems were reviewed with the patient and are negative.  PHYSICAL EXAMINATION: ECOG PERFORMANCE  STATUS: 0 - Asymptomatic  Filed Vitals:   07/27/13 1205  BP: 120/76  Pulse: 86  Temp: 98 F (36.7 C)  Resp: 20   Filed Weights   07/27/13 1205  Weight: 121 lb 11.2 oz (55.203 kg)    GENERAL:alert, no distress and comfortable. She looks thin and mildly cachectic SKIN: skin color, texture, turgor are normal, no rashes or significant lesions Musculoskeletal:no cyanosis of digits and no clubbing  NEURO: alert & oriented x 3 with fluent speech, no focal motor/sensory deficits  LABORATORY DATA:  I have reviewed the data as listed    Component Value Date/Time   NA 141 07/27/2013 1142   NA 143 10/10/2011 1023   K 4.2 07/27/2013 1142   K 3.8 10/10/2011 1023   CL 107 08/30/2012 1055   CL 105 10/10/2011 1023   CO2 24 07/27/2013 1142   CO2 29 10/10/2011 1023   GLUCOSE 88 07/27/2013 1142   GLUCOSE 102* 08/30/2012 1055   GLUCOSE 74 10/10/2011 1023   BUN 12.8 07/27/2013 1142   BUN 16 10/10/2011 1023   CREATININE 0.8 07/27/2013 1142   CREATININE 0.83 10/10/2011 1023   CREATININE 0.75 07/17/2009 1541   CALCIUM 8.7 07/27/2013 1142   CALCIUM 9.3 10/10/2011 1023   PROT 6.0* 07/27/2013 1142   PROT 6.7 08/19/2011 1353   ALBUMIN 4.1 07/27/2013 1142   ALBUMIN 4.2 08/19/2011 1353   AST 23 07/27/2013 1142   AST 23 08/19/2011 1353   ALT 14 07/27/2013 1142   ALT 11 08/19/2011 1353   ALKPHOS 61 07/27/2013 1142   ALKPHOS 54 08/19/2011 1353   BILITOT 0.29 07/27/2013 1142   BILITOT 0.3 08/19/2011 1353    No results found for this basename: SPEP,  UPEP,   kappa and lambda light chains    Lab Results  Component Value Date   WBC 3.4* 07/27/2013   NEUTROABS 1.7 07/27/2013   HGB 12.3 07/27/2013   HCT 36.9 07/27/2013   MCV 101.9* 07/27/2013   PLT 127* 07/27/2013      Chemistry      Component Value Date/Time   NA 141 07/27/2013 1142   NA 143 10/10/2011 1023   K 4.2 07/27/2013 1142   K 3.8 10/10/2011 1023   CL 107 08/30/2012 1055   CL 105 10/10/2011 1023   CO2 24 07/27/2013 1142   CO2 29 10/10/2011 1023   BUN 12.8 07/27/2013 1142    BUN 16 10/10/2011 1023   CREATININE 0.8 07/27/2013 1142   CREATININE 0.83 10/10/2011 1023   CREATININE 0.75 07/17/2009 1541      Component Value Date/Time   CALCIUM 8.7 07/27/2013 1142   CALCIUM 9.3 10/10/2011 1023   ALKPHOS 61 07/27/2013 1142   ALKPHOS 54 08/19/2011 1353   AST 23 07/27/2013 1142   AST 23 08/19/2011 1353   ALT 14 07/27/2013 1142   ALT 11 08/19/2011 1353   BILITOT 0.29 07/27/2013 1142   BILITOT 0.3 08/19/2011 1353     ASSESSMENT & PLAN:  #1 multiple myeloma Her most recent serum light chains were within normal limits. We discussed recently about risk and benefits of going on chemotherapy holiday. After prolonged discussion, she is in agreement not to go on  maintainence Revlimid. I will see her back in 3 months with history, physical examination and blood work. #2 mild pancytopenia Her last blood work done showed pancytopenia but she was on treatment at that time. She is currently off treatment and it is improving. She is not symptomatic. Recommend observation #3 History of compression fracture I recommend vitamin D and calcium supplements #4 hypogammaglobulinemia The patient is not symptomatic. Recommend observation for now. #5 increased bruising Recommend she resume aspirin. Her platelet count is adequate.  Orders Placed This Encounter  Procedures  . Comprehensive metabolic panel    Standing Status: Future     Number of Occurrences:      Standing Expiration Date: 07/27/2014  . CBC with Differential    Standing Status: Future     Number of Occurrences:      Standing Expiration Date: 07/27/2014  . Lactate dehydrogenase    Standing Status: Future     Number of Occurrences:      Standing Expiration Date: 07/27/2014  . SPEP & IFE with QIG    Standing Status: Future     Number of Occurrences:      Standing Expiration Date: 07/27/2014  . Kappa/lambda light chains    Standing Status: Future     Number of Occurrences:      Standing Expiration Date: 07/27/2014  . Beta 2  microglobulin, serum    Standing Status: Future     Number of Occurrences:      Standing Expiration Date: 07/28/2014   All questions were answered. The patient knows to call the clinic with any problems, questions or concerns. No barriers to learning was detected. I spent 25 minutes counseling the patient face to face. The total time spent in the appointment was 30 minutes and more than 50% was on counseling and review of test results     [name], MD 07/27/2013 4:04 PM

## 2013-07-27 NOTE — Patient Instructions (Signed)

## 2013-07-27 NOTE — Telephone Encounter (Signed)
gv adn printed appt sched and avs for pt for July and Aug °

## 2013-08-03 ENCOUNTER — Other Ambulatory Visit: Payer: Self-pay | Admitting: Oncology

## 2013-08-05 ENCOUNTER — Other Ambulatory Visit: Payer: Self-pay | Admitting: Hematology and Oncology

## 2013-08-05 ENCOUNTER — Other Ambulatory Visit: Payer: Self-pay | Admitting: Oncology

## 2013-08-09 ENCOUNTER — Other Ambulatory Visit: Payer: Self-pay | Admitting: Oncology

## 2013-08-11 ENCOUNTER — Telehealth: Payer: Self-pay | Admitting: *Deleted

## 2013-08-11 NOTE — Telephone Encounter (Signed)
Husband left VM this morning asking what pt's light chains were on last lab 5/20?  Called back and left him a VM informing light chains were not checked on pt's last visit in May.  They are ordered to be checked again on her next visit in August.  Asked him to please call back if any further questions.

## 2013-08-12 ENCOUNTER — Telehealth: Payer: Self-pay | Admitting: *Deleted

## 2013-08-12 NOTE — Telephone Encounter (Signed)
Spoke with patient's husband. Dr Alvy Bimler does not feel patient needs potassium at this time. Is on low dose and her level has been increasing. To call if has questions

## 2013-09-01 ENCOUNTER — Other Ambulatory Visit: Payer: Self-pay | Admitting: *Deleted

## 2013-09-01 ENCOUNTER — Telehealth: Payer: Self-pay | Admitting: *Deleted

## 2013-09-01 NOTE — Telephone Encounter (Signed)
Spoke with pt and informed pt that per Dr. Alvy Bimler - it is not necessary to check labs with port flush on 09/08/13.  Pt voiced understanding.

## 2013-09-01 NOTE — Telephone Encounter (Signed)
Pt called and left message re: pt will have port flush only on 09/08/13.  Pt would like to know if Dr. Alvy Bimler would consider checking general labs since pt will not have another appt for lab until  10/19/13 , and office visit on  10/26/13. Pt's  Phone    [phone].

## 2013-09-08 ENCOUNTER — Ambulatory Visit (HOSPITAL_BASED_OUTPATIENT_CLINIC_OR_DEPARTMENT_OTHER): Payer: 59

## 2013-09-08 ENCOUNTER — Telehealth: Payer: Self-pay | Admitting: *Deleted

## 2013-09-08 VITALS — BP 126/81 | HR 96 | Temp 98.4°F

## 2013-09-08 DIAGNOSIS — C9002 Multiple myeloma in relapse: Secondary | ICD-10-CM

## 2013-09-08 DIAGNOSIS — Z95828 Presence of other vascular implants and grafts: Secondary | ICD-10-CM

## 2013-09-08 DIAGNOSIS — Z452 Encounter for adjustment and management of vascular access device: Secondary | ICD-10-CM

## 2013-09-08 MED ORDER — HEPARIN SOD (PORK) LOCK FLUSH 100 UNIT/ML IV SOLN
500.0000 [IU] | Freq: Once | INTRAVENOUS | Status: AC
  Administered 2013-09-08: 500 [IU] via INTRAVENOUS
  Filled 2013-09-08: qty 5

## 2013-09-08 MED ORDER — SODIUM CHLORIDE 0.9 % IJ SOLN
10.0000 mL | INTRAMUSCULAR | Status: DC | PRN
  Administered 2013-09-08: 10 mL via INTRAVENOUS
  Filled 2013-09-08: qty 10

## 2013-09-08 NOTE — Patient Instructions (Signed)

## 2013-09-08 NOTE — Telephone Encounter (Signed)
Pt confirmed her appts for August.  Is she supposed to get Zometa when she sees Dr. Alvy Bimler on 8/19?  She is not scheduled for infusion on that day.

## 2013-09-11 NOTE — Telephone Encounter (Signed)
Pls schedule zometa same day Thanks

## 2013-09-12 ENCOUNTER — Other Ambulatory Visit: Payer: Self-pay | Admitting: *Deleted

## 2013-09-12 ENCOUNTER — Telehealth: Payer: Self-pay | Admitting: Hematology and Oncology

## 2013-09-12 NOTE — Telephone Encounter (Signed)
added zometa after 8/19 f/u per 7/6 pof- start time remains the same.

## 2013-09-16 ENCOUNTER — Telehealth: Payer: Self-pay | Admitting: *Deleted

## 2013-09-16 NOTE — Telephone Encounter (Signed)
VM from [place] at [employer] requesting a Prior Auth on Revlimid.  Called her back and left VM informing that Revlimid was d/c'd and no prior auth needed.

## 2013-10-18 ENCOUNTER — Other Ambulatory Visit: Payer: Self-pay | Admitting: Oncology

## 2013-10-19 ENCOUNTER — Other Ambulatory Visit (HOSPITAL_BASED_OUTPATIENT_CLINIC_OR_DEPARTMENT_OTHER): Payer: 59

## 2013-10-19 ENCOUNTER — Other Ambulatory Visit: Payer: 59

## 2013-10-19 ENCOUNTER — Ambulatory Visit: Payer: 59 | Admitting: Hematology and Oncology

## 2013-10-19 ENCOUNTER — Ambulatory Visit (HOSPITAL_BASED_OUTPATIENT_CLINIC_OR_DEPARTMENT_OTHER): Payer: 59

## 2013-10-19 VITALS — BP 114/86 | HR 96 | Temp 99.1°F

## 2013-10-19 DIAGNOSIS — Z95828 Presence of other vascular implants and grafts: Secondary | ICD-10-CM

## 2013-10-19 DIAGNOSIS — C9001 Multiple myeloma in remission: Secondary | ICD-10-CM

## 2013-10-19 DIAGNOSIS — Z452 Encounter for adjustment and management of vascular access device: Secondary | ICD-10-CM

## 2013-10-19 DIAGNOSIS — C9000 Multiple myeloma not having achieved remission: Secondary | ICD-10-CM

## 2013-10-19 LAB — COMPREHENSIVE METABOLIC PANEL (CC13)
ALT: 11 U/L (ref 0–55)
AST: 25 U/L (ref 5–34)
Albumin: 3.9 g/dL (ref 3.5–5.0)
Alkaline Phosphatase: 43 U/L (ref 40–150)
Anion Gap: 9 mEq/L (ref 3–11)
BUN: 9.1 mg/dL (ref 7.0–26.0)
CALCIUM: 8.9 mg/dL (ref 8.4–10.4)
CHLORIDE: 106 meq/L (ref 98–109)
CO2: 26 meq/L (ref 22–29)
Creatinine: 0.8 mg/dL (ref 0.6–1.1)
Glucose: 98 mg/dl (ref 70–140)
POTASSIUM: 4.2 meq/L (ref 3.5–5.1)
Sodium: 141 mEq/L (ref 136–145)
Total Bilirubin: 0.38 mg/dL (ref 0.20–1.20)
Total Protein: 5.9 g/dL — ABNORMAL LOW (ref 6.4–8.3)

## 2013-10-19 LAB — CBC WITH DIFFERENTIAL/PLATELET
BASO%: 0.5 % (ref 0.0–2.0)
BASOS ABS: 0 10*3/uL (ref 0.0–0.1)
EOS%: 1.7 % (ref 0.0–7.0)
Eosinophils Absolute: 0.1 10*3/uL (ref 0.0–0.5)
HCT: 37.5 % (ref 34.8–46.6)
HGB: 12.9 g/dL (ref 11.6–15.9)
LYMPH%: 24.8 % (ref 14.0–49.7)
MCH: 34.7 pg — ABNORMAL HIGH (ref 25.1–34.0)
MCHC: 34.4 g/dL (ref 31.5–36.0)
MCV: 100.8 fL (ref 79.5–101.0)
MONO#: 0.3 10*3/uL (ref 0.1–0.9)
MONO%: 6.6 % (ref 0.0–14.0)
NEUT#: 2.8 10*3/uL (ref 1.5–6.5)
NEUT%: 66.4 % (ref 38.4–76.8)
Platelets: 121 10*3/uL — ABNORMAL LOW (ref 145–400)
RBC: 3.72 10*6/uL (ref 3.70–5.45)
RDW: 12 % (ref 11.2–14.5)
WBC: 4.2 10*3/uL (ref 3.9–10.3)
lymph#: 1.1 10*3/uL (ref 0.9–3.3)

## 2013-10-19 LAB — LACTATE DEHYDROGENASE (CC13): LDH: 209 U/L (ref 125–245)

## 2013-10-19 MED ORDER — SODIUM CHLORIDE 0.9 % IJ SOLN
10.0000 mL | INTRAMUSCULAR | Status: DC | PRN
  Administered 2013-10-19: 10 mL via INTRAVENOUS
  Filled 2013-10-19: qty 10

## 2013-10-19 MED ORDER — HEPARIN SOD (PORK) LOCK FLUSH 100 UNIT/ML IV SOLN
500.0000 [IU] | Freq: Once | INTRAVENOUS | Status: AC
  Administered 2013-10-19: 500 [IU] via INTRAVENOUS
  Filled 2013-10-19: qty 5

## 2013-10-19 NOTE — Patient Instructions (Signed)

## 2013-10-20 ENCOUNTER — Other Ambulatory Visit: Payer: Self-pay | Admitting: Hematology and Oncology

## 2013-10-20 ENCOUNTER — Telehealth: Payer: Self-pay | Admitting: *Deleted

## 2013-10-20 NOTE — Telephone Encounter (Signed)
Husband requesting refill on Valtrex for pt..  Per Dr. Alvy Bimler, pt does not need to continue on Valtrex as she is not on chemotherapy at this time.  Informed husband and encouraged him to discuss w/ Dr. Alvy Bimler on appt next week if he has any questions.  He verbalized understanding.

## 2013-10-21 ENCOUNTER — Other Ambulatory Visit: Payer: Self-pay | Admitting: Hematology and Oncology

## 2013-10-21 LAB — SPEP & IFE WITH QIG
ALPHA-2-GLOBULIN: 9.3 % (ref 7.1–11.8)
Albumin ELP: 70.2 % — ABNORMAL HIGH (ref 55.8–66.1)
Alpha-1-Globulin: 4.3 % (ref 2.9–4.9)
BETA 2: 3.5 % (ref 3.2–6.5)
BETA GLOBULIN: 5.6 % (ref 4.7–7.2)
Gamma Globulin: 7.1 % — ABNORMAL LOW (ref 11.1–18.8)
IgA: 16 mg/dL — ABNORMAL LOW (ref 69–380)
IgG (Immunoglobin G), Serum: 392 mg/dL — ABNORMAL LOW (ref 690–1700)
IgM, Serum: 23 mg/dL — ABNORMAL LOW (ref 52–322)
TOTAL PROTEIN, SERUM ELECTROPHOR: 5.8 g/dL — AB (ref 6.0–8.3)

## 2013-10-21 LAB — KAPPA/LAMBDA LIGHT CHAINS
KAPPA LAMBDA RATIO: 1.04 (ref 0.26–1.65)
Kappa free light chain: 0.74 mg/dL (ref 0.33–1.94)
LAMBDA FREE LGHT CHN: 0.71 mg/dL (ref 0.57–2.63)

## 2013-10-21 LAB — BETA 2 MICROGLOBULIN, SERUM: Beta-2 Microglobulin: 1.81 mg/L (ref <=2.51)

## 2013-10-26 ENCOUNTER — Ambulatory Visit: Payer: 59

## 2013-10-26 ENCOUNTER — Telehealth: Payer: Self-pay | Admitting: Hematology and Oncology

## 2013-10-26 ENCOUNTER — Telehealth: Payer: Self-pay | Admitting: *Deleted

## 2013-10-26 ENCOUNTER — Encounter: Payer: Self-pay | Admitting: Hematology and Oncology

## 2013-10-26 ENCOUNTER — Ambulatory Visit (HOSPITAL_BASED_OUTPATIENT_CLINIC_OR_DEPARTMENT_OTHER): Payer: 59 | Admitting: Hematology and Oncology

## 2013-10-26 VITALS — BP 103/88 | HR 94 | Temp 98.2°F | Resp 18 | Ht 59.0 in | Wt 125.1 lb

## 2013-10-26 DIAGNOSIS — D801 Nonfamilial hypogammaglobulinemia: Secondary | ICD-10-CM | POA: Insufficient documentation

## 2013-10-26 DIAGNOSIS — B029 Zoster without complications: Secondary | ICD-10-CM

## 2013-10-26 DIAGNOSIS — C9001 Multiple myeloma in remission: Secondary | ICD-10-CM

## 2013-10-26 DIAGNOSIS — D696 Thrombocytopenia, unspecified: Secondary | ICD-10-CM | POA: Insufficient documentation

## 2013-10-26 DIAGNOSIS — N390 Urinary tract infection, site not specified: Secondary | ICD-10-CM

## 2013-10-26 DIAGNOSIS — M549 Dorsalgia, unspecified: Secondary | ICD-10-CM

## 2013-10-26 DIAGNOSIS — C9002 Multiple myeloma in relapse: Secondary | ICD-10-CM

## 2013-10-26 DIAGNOSIS — G8929 Other chronic pain: Secondary | ICD-10-CM

## 2013-10-26 DIAGNOSIS — Z87440 Personal history of urinary (tract) infections: Secondary | ICD-10-CM

## 2013-10-26 MED ORDER — VALACYCLOVIR HCL 500 MG PO TABS: ORAL_TABLET | ORAL | Status: DC

## 2013-10-26 NOTE — Assessment & Plan Note (Signed)
She will continue chronic pain management with the pain clinic.

## 2013-10-26 NOTE — Assessment & Plan Note (Signed)
She would like to continue taking Bactrim prophylactically due to her history of recurrent urinary tract infection.

## 2013-10-26 NOTE — Telephone Encounter (Signed)
Per staff message and POF I have scheduled appts. Advised scheduler of appts. [name]  

## 2013-10-26 NOTE — Progress Notes (Signed)
[place] OFFICE PROGRESS NOTE  Patient Care Team: Vidal Schwalbe, MD as PCP - General [name], MD as Consulting Physician (Oncology) Floyce Stakes, MD as Consulting Physician (Neurosurgery) [name], MD as Consulting Physician (Hematology and Oncology)  SUMMARY OF ONCOLOGIC HISTORY: She was diagnosed with kappa light chain multiple myeloma in May 2009 when she presented with back pain, anemia, hypercalcemia, and proteinuria. Briefly, she received induction therapy with RVD and achieved an excellent response. She went on to receive high-dose IV melphalan with autologous stem cell support in October 2009 at [facility]. She received monthly Zometa for the first 2 years and then changed to an every 6 month schedule.  She showed signs of early progression in June 2013 with rising kappa free light chains in the serum and appearance of monoclonal proteins in the urine. Bone marrow aspirate and biopsy done 09/25/2011 showed only 4% plasma cells. Negative cytogenetic studies. She was put her on treatment with a combination of Revlimid 25 mg 3 weeks on one-week off plus dexamethasone 40 mg by mouth weekly beginning in August 2013. She had a nice response with fall in her paraprotein levels back to normal and in January 2014, with modified schedule by deleting the steroids and dropping the Revlimid down to a single 10 mg daily dose. At her request, prior hematologist further decreased her Revlimid to 3 weeks on 1 week off.  She was receiving Zometa on an every six-month basis.  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 She made an informed decision to stop Revlimid and go on observation only.  INTERVAL HISTORY: Please see below for problem oriented charting. She feels well since discontinuation of chemotherapy. She has better energy level. She denies new bone pain. Denies recent infection.  REVIEW OF  SYSTEMS:   Constitutional: Denies fevers, chills or abnormal weight loss Eyes: Denies blurriness of vision Ears, nose, mouth, throat, and face: Denies mucositis or sore throat Respiratory: Denies cough, dyspnea or wheezes Cardiovascular: Denies palpitation, chest discomfort or lower extremity swelling Gastrointestinal:  Denies nausea, heartburn or change in bowel habits Skin: Denies abnormal skin rashes Lymphatics: Denies new lymphadenopathy or easy bruising Neurological:Denies numbness, tingling or new weaknesses Behavioral/Psych: Mood is stable, no new changes  All other systems were reviewed with the patient and are negative.  I have reviewed the past medical history, past surgical history, social history and family history with the patient and they are unchanged from previous note.  ALLERGIES:  is allergic to hydromorphone.  MEDICATIONS:  Current Outpatient Prescriptions  Medication Sig Dispense Refill  . aspirin 81 MG tablet Take 81 mg by mouth.      . cholecalciferol (VITAMIN D) 1000 UNITS tablet Take 1,000 Units by mouth daily.      . Multiple Vitamins-Minerals (ONE-A-DAY 50 PLUS PO) Take by mouth daily.      Marland Kitchen oxycodone (OXY-IR) 5 MG capsule Take 5 mg by mouth every 4 (four) hours as needed. pain      . Oxycodone HCl (OXYCONTIN) 60 MG TB12 Take 1 tablet by mouth 2 (two) times daily.      . pantoprazole (PROTONIX) 40 MG tablet Take 1 tablet (40 mg total) by mouth daily as needed.  30 tablet  3  . sulfamethoxazole-trimethoprim (BACTRIM DS) 800-160 MG per tablet TAKE 1 TABLET ON MONDAY, WEDNESDAY, AND FRIDAY.  12 tablet  1  . valACYclovir (VALTREX) 500 MG tablet TAKE 1 TABLET BY  MOUTH DAILY.  30 tablet  3   No current facility-administered medications for this visit.    PHYSICAL EXAMINATION: ECOG PERFORMANCE STATUS: 0 - Asymptomatic  Filed Vitals:   10/26/13 1126  BP: 103/88  Pulse: 94  Temp: 98.2 F (36.8 C)  Resp: 18   Filed Weights   10/26/13 1126  Weight: 125 lb  1.6 oz (56.745 kg)    GENERAL:alert, no distress and comfortable. She looks thin but not cachectic SKIN: skin color, texture, turgor are normal, no rashes or significant lesions EYES: normal, Conjunctiva are pink and non-injected, sclera clear Musculoskeletal:no cyanosis of digits and no clubbing  NEURO: alert & oriented x 3 with fluent speech, no focal motor/sensory deficits  LABORATORY DATA:  I have reviewed the data as listed    Component Value Date/Time   NA 141 10/19/2013 1034   NA 143 10/10/2011 1023   K 4.2 10/19/2013 1034   K 3.8 10/10/2011 1023   CL 107 08/30/2012 1055   CL 105 10/10/2011 1023   CO2 26 10/19/2013 1034   CO2 29 10/10/2011 1023   GLUCOSE 98 10/19/2013 1034   GLUCOSE 102* 08/30/2012 1055   GLUCOSE 74 10/10/2011 1023   BUN 9.1 10/19/2013 1034   BUN 16 10/10/2011 1023   CREATININE 0.8 10/19/2013 1034   CREATININE 0.83 10/10/2011 1023   CREATININE 0.75 07/17/2009 1541   CALCIUM 8.9 10/19/2013 1034   CALCIUM 9.3 10/10/2011 1023   PROT 5.9* 10/19/2013 1034   PROT 6.7 08/19/2011 1353   ALBUMIN 3.9 10/19/2013 1034   ALBUMIN 4.2 08/19/2011 1353   AST 25 10/19/2013 1034   AST 23 08/19/2011 1353   ALT 11 10/19/2013 1034   ALT 11 08/19/2011 1353   ALKPHOS 43 10/19/2013 1034   ALKPHOS 54 08/19/2011 1353   BILITOT 0.38 10/19/2013 1034   BILITOT 0.3 08/19/2011 1353    No results found for this basename: SPEP,  UPEP,   kappa and lambda light chains    Lab Results  Component Value Date   WBC 4.2 10/19/2013   NEUTROABS 2.8 10/19/2013   HGB 12.9 10/19/2013   HCT 37.5 10/19/2013   MCV 100.8 10/19/2013   PLT 121* 10/19/2013      Chemistry      Component Value Date/Time   NA 141 10/19/2013 1034   NA 143 10/10/2011 1023   K 4.2 10/19/2013 1034   K 3.8 10/10/2011 1023   CL 107 08/30/2012 1055   CL 105 10/10/2011 1023   CO2 26 10/19/2013 1034   CO2 29 10/10/2011 1023   BUN 9.1 10/19/2013 1034   BUN 16 10/10/2011 1023   CREATININE 0.8 10/19/2013 1034   CREATININE 0.83 10/10/2011 1023   CREATININE 0.75  07/17/2009 1541      Component Value Date/Time   CALCIUM 8.9 10/19/2013 1034   CALCIUM 9.3 10/10/2011 1023   ALKPHOS 43 10/19/2013 1034   ALKPHOS 54 08/19/2011 1353   AST 25 10/19/2013 1034   AST 23 08/19/2011 1353   ALT 11 10/19/2013 1034   ALT 11 08/19/2011 1353   BILITOT 0.38 10/19/2013 1034   BILITOT 0.3 08/19/2011 1353      ASSESSMENT & PLAN:  Multiple myeloma in remission Clinically and from her recent blood work, the patient has no signs of recurrence. She is in complete remission. I recommend we continue on chemotherapy holiday. I will get her blood rechecked in 3 months and see her early next year. In the meantime, we will continue the Zometa  every 6 months.  Herpes zoster infection She has history of recurrent herpes infection. She will continue on Valtrex indefinitely.  Chronic back pain She will continue chronic pain management with the pain clinic.  History of recurrent UTI (urinary tract infection) She would like to continue taking Bactrim prophylactically due to her history of recurrent urinary tract infection.  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 The level of IgG level is improving. I recommend continuing chemotherapy holiday. There is no indication for IVIG for now.  Thrombocytopenia, unspecified The cause is unknown. It is mild and there is little change compared from previous platelet count. The patient denies recent history of bleeding such as epistaxis, hematuria or hematochezia. She is asymptomatic from the thrombocytopenia. I will observe for now.     No orders of the defined types were placed in this encounter.   All questions were answered. The patient knows to call the clinic with any problems, questions or concerns. No barriers to learning was detected. I spent 25 minutes counseling the patient face to face. The total time spent in the appointment was 30 minutes and more than 50% was on counseling and review of test  results     [facility], [place], MD 10/26/2013 9:41 PM

## 2013-10-26 NOTE — Assessment & Plan Note (Signed)
She has history of recurrent herpes infection. She will continue on Valtrex indefinitely.

## 2013-10-26 NOTE — Assessment & Plan Note (Signed)
This is related to her prior treatment. The level of IgG level is improving. I recommend continuing chemotherapy holiday. There is no indication for IVIG for now. 

## 2013-10-26 NOTE — Telephone Encounter (Signed)
Pt confirmed labs/ov per 08/19 POF, sent msg to add chemo, gave pt AVS....KJ

## 2013-10-26 NOTE — Assessment & Plan Note (Signed)
The cause is unknown. It is mild and there is little change compared from previous platelet count. The patient denies recent history of bleeding such as epistaxis, hematuria or hematochezia. She is asymptomatic from the thrombocytopenia. I will observe for now.  

## 2013-10-26 NOTE — Assessment & Plan Note (Signed)
Clinically and from her recent blood work, the patient has no signs of recurrence. She is in complete remission. I recommend we continue on chemotherapy holiday. I will get her blood rechecked in 3 months and see her early next year. In the meantime, we will continue the Zometa every 6 months.

## 2013-11-16 ENCOUNTER — Ambulatory Visit (HOSPITAL_BASED_OUTPATIENT_CLINIC_OR_DEPARTMENT_OTHER): Payer: 59

## 2013-11-16 ENCOUNTER — Ambulatory Visit: Payer: 59

## 2013-11-16 VITALS — BP 127/64 | HR 101 | Temp 98.2°F | Resp 16

## 2013-11-16 DIAGNOSIS — Z95828 Presence of other vascular implants and grafts: Secondary | ICD-10-CM

## 2013-11-16 DIAGNOSIS — C9002 Multiple myeloma in relapse: Secondary | ICD-10-CM

## 2013-11-16 DIAGNOSIS — C9001 Multiple myeloma in remission: Secondary | ICD-10-CM

## 2013-11-16 MED ORDER — HEPARIN SOD (PORK) LOCK FLUSH 100 UNIT/ML IV SOLN
250.0000 [IU] | Freq: Once | INTRAVENOUS | Status: DC | PRN
  Filled 2013-11-16: qty 5

## 2013-11-16 MED ORDER — ZOLEDRONIC ACID 4 MG/100ML IV SOLN
4.0000 mg | Freq: Once | INTRAVENOUS | Status: AC
  Administered 2013-11-16: 4 mg via INTRAVENOUS
  Filled 2013-11-16: qty 100

## 2013-11-16 MED ORDER — SODIUM CHLORIDE 0.9 % IV SOLN
Freq: Once | INTRAVENOUS | Status: AC
  Administered 2013-11-16: 11:00:00 via INTRAVENOUS

## 2013-11-16 MED ORDER — SODIUM CHLORIDE 0.9 % IJ SOLN
3.0000 mL | Freq: Once | INTRAMUSCULAR | Status: DC | PRN
  Filled 2013-11-16: qty 10

## 2013-11-16 MED ORDER — ALTEPLASE 2 MG IJ SOLR
2.0000 mg | Freq: Once | INTRAMUSCULAR | Status: DC | PRN
  Filled 2013-11-16: qty 2

## 2013-11-16 MED ORDER — HEPARIN SOD (PORK) LOCK FLUSH 100 UNIT/ML IV SOLN
500.0000 [IU] | Freq: Once | INTRAVENOUS | Status: AC
  Administered 2013-11-16: 500 [IU] via INTRAVENOUS
  Filled 2013-11-16: qty 5

## 2013-11-16 MED ORDER — SODIUM CHLORIDE 0.9 % IJ SOLN
10.0000 mL | INTRAMUSCULAR | Status: DC | PRN
  Filled 2013-11-16: qty 10

## 2013-11-16 MED ORDER — SODIUM CHLORIDE 0.9 % IJ SOLN
10.0000 mL | INTRAMUSCULAR | Status: DC | PRN
  Administered 2013-11-16: 10 mL via INTRAVENOUS
  Filled 2013-11-16: qty 10

## 2013-11-16 NOTE — Progress Notes (Signed)
Pt refused flush.

## 2013-11-16 NOTE — Patient Instructions (Signed)

## 2013-12-28 ENCOUNTER — Ambulatory Visit (HOSPITAL_BASED_OUTPATIENT_CLINIC_OR_DEPARTMENT_OTHER): Payer: 59

## 2013-12-28 VITALS — BP 127/83 | HR 121 | Temp 99.1°F | Resp 20

## 2013-12-28 DIAGNOSIS — Z95828 Presence of other vascular implants and grafts: Secondary | ICD-10-CM

## 2013-12-28 DIAGNOSIS — C9001 Multiple myeloma in remission: Secondary | ICD-10-CM

## 2013-12-28 DIAGNOSIS — Z452 Encounter for adjustment and management of vascular access device: Secondary | ICD-10-CM

## 2013-12-28 MED ORDER — HEPARIN SOD (PORK) LOCK FLUSH 100 UNIT/ML IV SOLN
500.0000 [IU] | Freq: Once | INTRAVENOUS | Status: AC
  Administered 2013-12-28: 500 [IU] via INTRAVENOUS
  Filled 2013-12-28: qty 5

## 2013-12-28 MED ORDER — SODIUM CHLORIDE 0.9 % IJ SOLN
10.0000 mL | INTRAMUSCULAR | Status: DC | PRN
  Administered 2013-12-28: 10 mL via INTRAVENOUS
  Filled 2013-12-28: qty 10

## 2013-12-28 NOTE — Patient Instructions (Signed)

## 2014-01-02 ENCOUNTER — Telehealth: Payer: Self-pay | Admitting: *Deleted

## 2014-01-02 NOTE — Telephone Encounter (Signed)
Pt has labs here on 01/18/14.  She sees Dr. Ok Edwards in [place] on 11/13 also w/ labs. She asks if she can cancel her lab appt here on 11/11?

## 2014-01-03 ENCOUNTER — Telehealth: Payer: Self-pay | Admitting: *Deleted

## 2014-01-03 NOTE — Telephone Encounter (Signed)
Yes, OK to cancel labs here in Nov

## 2014-01-03 NOTE — Telephone Encounter (Signed)
Canceled lab here on 11/11 since pt seeing Dr. Ok Edwards in [place] on 11/13 and having labs done there.  Pt notified.

## 2014-01-13 ENCOUNTER — Other Ambulatory Visit: Payer: Self-pay | Admitting: Nurse Practitioner

## 2014-01-18 ENCOUNTER — Other Ambulatory Visit: Payer: 59

## 2014-01-25 ENCOUNTER — Other Ambulatory Visit: Payer: Self-pay | Admitting: Hematology and Oncology

## 2014-01-25 ENCOUNTER — Telehealth: Payer: Self-pay | Admitting: *Deleted

## 2014-01-25 NOTE — Telephone Encounter (Signed)
-----   Message from [name], MD sent at 01/25/2014  1:45 PM EST ----- Looks normal to me ----- Message -----    From: Patton Salles, RN    Sent: 01/25/2014   1:07 PM      To: [name], MD  Were you able to see labs on [place] from [facility]? Husband has called for results. Thanks, [employer]

## 2014-01-25 NOTE — Telephone Encounter (Signed)
Notified of message below

## 2014-02-06 ENCOUNTER — Other Ambulatory Visit: Payer: Self-pay | Admitting: Hematology and Oncology

## 2014-02-06 ENCOUNTER — Telehealth: Payer: Self-pay | Admitting: *Deleted

## 2014-02-06 NOTE — Telephone Encounter (Signed)
Labs are stable. Her WBC and platelets are a little low but not much different than before. All labs indicated she is still in remission.

## 2014-02-06 NOTE — Telephone Encounter (Signed)
Husband calling for results of labs from [facility].   He says there are more results back now and he is asking if Dr. Alvy Bimler has had a chance to review them and if the results can be called to patient?

## 2014-02-07 ENCOUNTER — Telehealth: Payer: Self-pay | Admitting: *Deleted

## 2014-02-08 ENCOUNTER — Ambulatory Visit (HOSPITAL_BASED_OUTPATIENT_CLINIC_OR_DEPARTMENT_OTHER): Payer: 59

## 2014-02-08 VITALS — BP 131/76 | HR 111 | Temp 98.3°F

## 2014-02-08 DIAGNOSIS — Z95828 Presence of other vascular implants and grafts: Secondary | ICD-10-CM

## 2014-02-08 DIAGNOSIS — Z452 Encounter for adjustment and management of vascular access device: Secondary | ICD-10-CM

## 2014-02-08 DIAGNOSIS — C9001 Multiple myeloma in remission: Secondary | ICD-10-CM

## 2014-02-08 MED ORDER — HEPARIN SOD (PORK) LOCK FLUSH 100 UNIT/ML IV SOLN
500.0000 [IU] | Freq: Once | INTRAVENOUS | Status: AC
  Administered 2014-02-08: 500 [IU] via INTRAVENOUS
  Filled 2014-02-08: qty 5

## 2014-02-08 MED ORDER — SODIUM CHLORIDE 0.9 % IJ SOLN
10.0000 mL | INTRAMUSCULAR | Status: DC | PRN
  Administered 2014-02-08: 10 mL via INTRAVENOUS
  Filled 2014-02-08: qty 10

## 2014-02-08 NOTE — Patient Instructions (Signed)

## 2014-02-08 NOTE — Telephone Encounter (Signed)
Pt's husband had left VM requesting lab results from [facility].   Called back and left VM that Dr. Alvy Bimler did review them and said "Labs are stable. Her WBC and platelets are a little low but not much different than before. All labs indicated she is still in remission."   Asked pt or husband to call back if they have any questions.

## 2014-03-22 ENCOUNTER — Telehealth: Payer: Self-pay | Admitting: Hematology and Oncology

## 2014-03-22 NOTE — Telephone Encounter (Signed)
returned call and s.w. pt and r/s missed appt....pt ok and aware of new d.t °

## 2014-03-23 ENCOUNTER — Ambulatory Visit (HOSPITAL_BASED_OUTPATIENT_CLINIC_OR_DEPARTMENT_OTHER): Payer: 59

## 2014-03-23 DIAGNOSIS — Z452 Encounter for adjustment and management of vascular access device: Secondary | ICD-10-CM

## 2014-03-23 DIAGNOSIS — Z95828 Presence of other vascular implants and grafts: Secondary | ICD-10-CM

## 2014-03-23 DIAGNOSIS — C9001 Multiple myeloma in remission: Secondary | ICD-10-CM

## 2014-03-23 MED ORDER — HEPARIN SOD (PORK) LOCK FLUSH 100 UNIT/ML IV SOLN
500.0000 [IU] | Freq: Once | INTRAVENOUS | Status: AC
  Administered 2014-03-23: 500 [IU] via INTRAVENOUS
  Filled 2014-03-23: qty 5

## 2014-03-23 MED ORDER — SODIUM CHLORIDE 0.9 % IJ SOLN
10.0000 mL | INTRAMUSCULAR | Status: DC | PRN
  Administered 2014-03-23: 10 mL via INTRAVENOUS
  Filled 2014-03-23: qty 10

## 2014-03-23 NOTE — Patient Instructions (Signed)

## 2014-04-19 ENCOUNTER — Other Ambulatory Visit (HOSPITAL_BASED_OUTPATIENT_CLINIC_OR_DEPARTMENT_OTHER): Payer: 59

## 2014-04-19 ENCOUNTER — Other Ambulatory Visit: Payer: Self-pay | Admitting: *Deleted

## 2014-04-19 ENCOUNTER — Ambulatory Visit (HOSPITAL_BASED_OUTPATIENT_CLINIC_OR_DEPARTMENT_OTHER): Payer: 59

## 2014-04-19 DIAGNOSIS — C9001 Multiple myeloma in remission: Secondary | ICD-10-CM

## 2014-04-19 DIAGNOSIS — Z95828 Presence of other vascular implants and grafts: Secondary | ICD-10-CM

## 2014-04-19 LAB — COMPREHENSIVE METABOLIC PANEL (CC13)
ALBUMIN: 4 g/dL (ref 3.5–5.0)
ALK PHOS: 55 U/L (ref 40–150)
ALT: 11 U/L (ref 0–55)
AST: 21 U/L (ref 5–34)
Anion Gap: 10 mEq/L (ref 3–11)
BUN: 14.4 mg/dL (ref 7.0–26.0)
CALCIUM: 8.7 mg/dL (ref 8.4–10.4)
CO2: 26 mEq/L (ref 22–29)
Chloride: 107 mEq/L (ref 98–109)
Creatinine: 0.8 mg/dL (ref 0.6–1.1)
EGFR: 85 mL/min/{1.73_m2} — ABNORMAL LOW (ref >90)
Glucose: 99 mg/dl (ref 70–140)
POTASSIUM: 3.9 meq/L (ref 3.5–5.1)
Sodium: 143 mEq/L (ref 136–145)
Total Bilirubin: 0.49 mg/dL (ref 0.20–1.20)
Total Protein: 6.1 g/dL — ABNORMAL LOW (ref 6.4–8.3)

## 2014-04-19 LAB — CBC WITH DIFFERENTIAL/PLATELET
BASO%: 0.9 % (ref 0.0–2.0)
BASOS ABS: 0 10*3/uL (ref 0.0–0.1)
EOS ABS: 0.1 10*3/uL (ref 0.0–0.5)
EOS%: 2.9 % (ref 0.0–7.0)
HCT: 40 % (ref 34.8–46.6)
HEMOGLOBIN: 13.1 g/dL (ref 11.6–15.9)
LYMPH%: 27.3 % (ref 14.0–49.7)
MCH: 32.6 pg (ref 25.1–34.0)
MCHC: 32.6 g/dL (ref 31.5–36.0)
MCV: 100 fL (ref 79.5–101.0)
MONO#: 0.5 10*3/uL (ref 0.1–0.9)
MONO%: 9.7 % (ref 0.0–14.0)
NEUT#: 3 10*3/uL (ref 1.5–6.5)
NEUT%: 59.2 % (ref 38.4–76.8)
PLATELETS: 135 10*3/uL — AB (ref 145–400)
RBC: 4 10*6/uL (ref 3.70–5.45)
RDW: 12.2 % (ref 11.2–14.5)
WBC: 5.1 10*3/uL (ref 3.9–10.3)
lymph#: 1.4 10*3/uL (ref 0.9–3.3)

## 2014-04-19 LAB — LACTATE DEHYDROGENASE (CC13): LDH: 209 U/L (ref 125–245)

## 2014-04-19 MED ORDER — SODIUM CHLORIDE 0.9 % IJ SOLN
10.0000 mL | INTRAMUSCULAR | Status: DC | PRN
  Administered 2014-04-19: 10 mL via INTRAVENOUS
  Filled 2014-04-19: qty 10

## 2014-04-19 MED ORDER — HEPARIN SOD (PORK) LOCK FLUSH 100 UNIT/ML IV SOLN
500.0000 [IU] | Freq: Once | INTRAVENOUS | Status: AC
  Administered 2014-04-19: 500 [IU] via INTRAVENOUS
  Filled 2014-04-19: qty 5

## 2014-04-21 LAB — SPEP & IFE WITH QIG
ALBUMIN ELP: 67.9 % — AB (ref 55.8–66.1)
ALPHA-2-GLOBULIN: 10.7 % (ref 7.1–11.8)
Alpha-1-Globulin: 4.6 % (ref 2.9–4.9)
BETA 2: 3.7 % (ref 3.2–6.5)
Beta Globulin: 5.8 % (ref 4.7–7.2)
GAMMA GLOBULIN: 7.3 % — AB (ref 11.1–18.8)
IGM, SERUM: 41 mg/dL — AB (ref 52–322)
IgA: 28 mg/dL — ABNORMAL LOW (ref 69–380)
IgG (Immunoglobin G), Serum: 474 mg/dL — ABNORMAL LOW (ref 690–1700)
Total Protein, Serum Electrophoresis: 6.1 g/dL (ref 6.0–8.3)

## 2014-04-21 LAB — KAPPA/LAMBDA LIGHT CHAINS
KAPPA LAMBDA RATIO: 2.22 — AB (ref 0.26–1.65)
Kappa free light chain: 1.64 mg/dL (ref 0.33–1.94)
LAMBDA FREE LGHT CHN: 0.74 mg/dL (ref 0.57–2.63)

## 2014-04-25 ENCOUNTER — Other Ambulatory Visit: Payer: Self-pay | Admitting: *Deleted

## 2014-04-25 DIAGNOSIS — B029 Zoster without complications: Secondary | ICD-10-CM

## 2014-04-25 MED ORDER — VALACYCLOVIR HCL 500 MG PO TABS: ORAL_TABLET | ORAL | Status: DC

## 2014-04-26 ENCOUNTER — Ambulatory Visit (HOSPITAL_BASED_OUTPATIENT_CLINIC_OR_DEPARTMENT_OTHER): Payer: 59 | Admitting: Hematology and Oncology

## 2014-04-26 ENCOUNTER — Telehealth: Payer: Self-pay | Admitting: *Deleted

## 2014-04-26 ENCOUNTER — Encounter: Payer: Self-pay | Admitting: Hematology and Oncology

## 2014-04-26 ENCOUNTER — Telehealth: Payer: Self-pay | Admitting: Hematology and Oncology

## 2014-04-26 VITALS — BP 117/84 | HR 102 | Temp 98.0°F | Resp 18 | Ht 59.0 in | Wt 128.3 lb

## 2014-04-26 DIAGNOSIS — D696 Thrombocytopenia, unspecified: Secondary | ICD-10-CM

## 2014-04-26 DIAGNOSIS — D801 Nonfamilial hypogammaglobulinemia: Secondary | ICD-10-CM

## 2014-04-26 DIAGNOSIS — C9001 Multiple myeloma in remission: Secondary | ICD-10-CM

## 2014-04-26 NOTE — Telephone Encounter (Signed)
Per staff message and POF I have scheduled appts. Advised scheduler of appts. [name]  

## 2014-04-26 NOTE — Telephone Encounter (Signed)
Gave avs & calendar for March thru September.Sent message to schedule treatment.

## 2014-04-27 NOTE — Progress Notes (Signed)
[place] OFFICE PROGRESS NOTE  Patient Care Team: Vidal Schwalbe, MD as PCP - General [name], MD as Consulting Physician (Oncology) Floyce Stakes, MD as Consulting Physician (Neurosurgery) [name], MD as Consulting Physician (Hematology and Oncology)  SUMMARY OF ONCOLOGIC HISTORY:  She was diagnosed with kappa light chain multiple myeloma in May 2009 when she presented with back pain, anemia, hypercalcemia, and proteinuria. Briefly, she received induction therapy with RVD and achieved an excellent response. She went on to receive high-dose IV melphalan with autologous stem cell support in October 2009 at [facility]. She received monthly Zometa for the first 2 years and then changed to an every 6 month schedule.  She showed signs of early progression in June 2013 with rising kappa free light chains in the serum and appearance of monoclonal proteins in the urine. Bone marrow aspirate and biopsy done 09/25/2011 showed only 4% plasma cells. Negative cytogenetic studies. She was put her on treatment with a combination of Revlimid 25 mg 3 weeks on one-week off plus dexamethasone 40 mg by mouth weekly beginning in August 2013. She had a nice response with fall in her paraprotein levels back to normal and in January 2014, with modified schedule by deleting the steroids and dropping the Revlimid down to a single 10 mg daily dose. At her request, prior hematologist further decreased her Revlimid to 3 weeks on 1 week off.  She was receiving Zometa on an every six-month basis.  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 She made an informed decision to stop Revlimid and go on observation only.  INTERVAL HISTORY: Please see below for problem oriented charting. She feels well. Denies recent bone pain. No recent infection.  REVIEW OF SYSTEMS:   Constitutional: Denies fevers, chills or abnormal weight  loss Eyes: Denies blurriness of vision Ears, nose, mouth, throat, and face: Denies mucositis or sore throat Respiratory: Denies cough, dyspnea or wheezes Cardiovascular: Denies palpitation, chest discomfort or lower extremity swelling Gastrointestinal:  Denies nausea, heartburn or change in bowel habits Skin: Denies abnormal skin rashes Lymphatics: Denies new lymphadenopathy or easy bruising Neurological:Denies numbness, tingling or new weaknesses Behavioral/Psych: Mood is stable, no new changes  All other systems were reviewed with the patient and are negative.  I have reviewed the past medical history, past surgical history, social history and family history with the patient and they are unchanged from previous note.  ALLERGIES:  is allergic to hydromorphone.  MEDICATIONS:  Current Outpatient Prescriptions  Medication Sig Dispense Refill  . aspirin 81 MG tablet Take 81 mg by mouth.    . cholecalciferol (VITAMIN D) 1000 UNITS tablet Take 1,000 Units by mouth daily.    . Multiple Vitamins-Minerals (ONE-A-DAY 50 PLUS PO) Take by mouth daily.    Marland Kitchen oxycodone (OXY-IR) 5 MG capsule Take 5 mg by mouth every 4 (four) hours as needed. pain    . Oxycodone HCl (OXYCONTIN) 60 MG TB12 Take 1 tablet by mouth 2 (two) times daily.    . pantoprazole (PROTONIX) 40 MG tablet TAKE (1) TABLET DAILY AS NEEDED. 30 tablet 3  . valACYclovir (VALTREX) 500 MG tablet TAKE 1 TABLET BY MOUTH DAILY. 30 tablet 11   No current facility-administered medications for this visit.    PHYSICAL EXAMINATION: ECOG PERFORMANCE STATUS: 0 - Asymptomatic  Filed Vitals:   04/26/14 1126  BP: 117/84  Pulse: 102  Temp: 98 F (36.7 C)  Resp: 18  Filed Weights   04/26/14 1126  Weight: 128 lb 4.8 oz (58.196 kg)    GENERAL:alert, no distress and comfortable SKIN: skin color, texture, turgor are normal, no rashes or significant lesions EYES: normal, Conjunctiva are pink and non-injected, sclera clear OROPHARYNX:no  exudate, no erythema and lips, buccal mucosa, and tongue normal  NECK: supple, thyroid normal size, non-tender, without nodularity LYMPH:  no palpable lymphadenopathy in the cervical, axillary or inguinal LUNGS: clear to auscultation and percussion with normal breathing effort HEART: regular rate & rhythm and no murmurs and no lower extremity edema ABDOMEN:abdomen soft, non-tender and normal bowel sounds Musculoskeletal:no cyanosis of digits and no clubbing  NEURO: alert & oriented x 3 with fluent speech, no focal motor/sensory deficits  LABORATORY DATA:  I have reviewed the data as listed    Component Value Date/Time   NA 143 04/19/2014 1128   NA 143 10/10/2011 1023   K 3.9 04/19/2014 1128   K 3.8 10/10/2011 1023   CL 107 08/30/2012 1055   CL 105 10/10/2011 1023   CO2 26 04/19/2014 1128   CO2 29 10/10/2011 1023   GLUCOSE 99 04/19/2014 1128   GLUCOSE 102* 08/30/2012 1055   GLUCOSE 74 10/10/2011 1023   BUN 14.4 04/19/2014 1128   BUN 16 10/10/2011 1023   CREATININE 0.8 04/19/2014 1128   CREATININE 0.83 10/10/2011 1023   CREATININE 0.75 07/17/2009 1541   CALCIUM 8.7 04/19/2014 1128   CALCIUM 9.3 10/10/2011 1023   PROT 6.1* 04/19/2014 1128   PROT 6.7 08/19/2011 1353   ALBUMIN 4.0 04/19/2014 1128   ALBUMIN 4.2 08/19/2011 1353   AST 21 04/19/2014 1128   AST 23 08/19/2011 1353   ALT 11 04/19/2014 1128   ALT 11 08/19/2011 1353   ALKPHOS 55 04/19/2014 1128   ALKPHOS 54 08/19/2011 1353   BILITOT 0.49 04/19/2014 1128   BILITOT 0.3 08/19/2011 1353    No results found for: SPEP, UPEP  Lab Results  Component Value Date   WBC 5.1 04/19/2014   NEUTROABS 3.0 04/19/2014   HGB 13.1 04/19/2014   HCT 40.0 04/19/2014   MCV 100.0 04/19/2014   PLT 135* 04/19/2014      Chemistry      Component Value Date/Time   NA 143 04/19/2014 1128   NA 143 10/10/2011 1023   K 3.9 04/19/2014 1128   K 3.8 10/10/2011 1023   CL 107 08/30/2012 1055   CL 105 10/10/2011 1023   CO2 26 04/19/2014  1128   CO2 29 10/10/2011 1023   BUN 14.4 04/19/2014 1128   BUN 16 10/10/2011 1023   CREATININE 0.8 04/19/2014 1128   CREATININE 0.83 10/10/2011 1023   CREATININE 0.75 07/17/2009 1541      Component Value Date/Time   CALCIUM 8.7 04/19/2014 1128   CALCIUM 9.3 10/10/2011 1023   ALKPHOS 55 04/19/2014 1128   ALKPHOS 54 08/19/2011 1353   AST 21 04/19/2014 1128   AST 23 08/19/2011 1353   ALT 11 04/19/2014 1128   ALT 11 08/19/2011 1353   BILITOT 0.49 04/19/2014 1128   BILITOT 0.3 08/19/2011 1353     ASSESSMENT & PLAN:  Multiple myeloma in remission Clinically and from her recent blood work, the patient has no signs of recurrence. She is in complete remission. I recommend we continue on chemotherapy holiday. I will get her blood rechecked in 3 months and see her in 6 months. In the meantime, we will continue the Zometa every 6 months.    Thrombocytopenia The cause  is unknown. It is mild and there is little change compared from previous platelet count. The patient denies recent history of bleeding such as epistaxis, hematuria or hematochezia. She is asymptomatic from the thrombocytopenia. I will observe for now.    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 The level of IgG level is improving. I recommend continuing chemotherapy holiday. There is no indication for IVIG for now.    Orders Placed This Encounter  Procedures  . CBC with Differential/Platelet    Standing Status: Future     Number of Occurrences:      Standing Expiration Date: 05/31/2015  . Comprehensive metabolic panel    Standing Status: Future     Number of Occurrences:      Standing Expiration Date: 05/31/2015  . SPEP & IFE with QIG    Standing Status: Future     Number of Occurrences:      Standing Expiration Date: 05/31/2015  . Kappa/lambda light chains    Standing Status: Future     Number of Occurrences:      Standing Expiration Date: 05/31/2015  . Beta 2 microglobulin,  serum    Standing Status: Future     Number of Occurrences:      Standing Expiration Date: 05/31/2015   All questions were answered. The patient knows to call the clinic with any problems, questions or concerns. No barriers to learning was detected. I spent 15 minutes counseling the patient face to face. The total time spent in the appointment was 20 minutes and more than 50% was on counseling and review of test results     [facility], [place], MD 04/27/2014 1:30 PM

## 2014-04-27 NOTE — Assessment & Plan Note (Signed)
The cause is unknown. It is mild and there is little change compared from previous platelet count. The patient denies recent history of bleeding such as epistaxis, hematuria or hematochezia. She is asymptomatic from the thrombocytopenia. I will observe for now.  

## 2014-04-27 NOTE — Assessment & Plan Note (Signed)
Clinically and from her recent blood work, the patient has no signs of recurrence. She is in complete remission. I recommend we continue on chemotherapy holiday. I will get her blood rechecked in 3 months and see her in 6 months. In the meantime, we will continue the Zometa every 6 months.

## 2014-04-27 NOTE — Assessment & Plan Note (Signed)
This is related to her prior treatment. The level of IgG level is improving. I recommend continuing chemotherapy holiday. There is no indication for IVIG for now.

## 2014-04-28 ENCOUNTER — Telehealth: Payer: Self-pay | Admitting: *Deleted

## 2014-04-28 NOTE — Telephone Encounter (Signed)
Informed pt of Dr. Calton Dach response. She verbalized understanding.

## 2014-04-28 NOTE — Telephone Encounter (Signed)
Pt states her Kappa:Lambda ratio was high and she wants to know what this means? Is this a bad thing?

## 2014-04-28 NOTE — Telephone Encounter (Signed)
THIS NOTE WAS SENT TO [place] AND [facility] [place].

## 2014-04-28 NOTE — Telephone Encounter (Signed)
Ratio does not mean anything since her total kappa and lambda are normal.

## 2014-04-28 NOTE — Telephone Encounter (Signed)
Can you pls find out what the prob was?

## 2014-05-17 ENCOUNTER — Ambulatory Visit: Payer: 59

## 2014-05-31 ENCOUNTER — Ambulatory Visit (HOSPITAL_BASED_OUTPATIENT_CLINIC_OR_DEPARTMENT_OTHER): Payer: 59

## 2014-05-31 ENCOUNTER — Other Ambulatory Visit: Payer: Self-pay

## 2014-05-31 DIAGNOSIS — C9001 Multiple myeloma in remission: Secondary | ICD-10-CM

## 2014-05-31 MED ORDER — SODIUM CHLORIDE 0.9 % IJ SOLN
10.0000 mL | INTRAMUSCULAR | Status: AC | PRN
  Administered 2014-05-31: 10 mL
  Filled 2014-05-31: qty 10

## 2014-05-31 MED ORDER — HEPARIN SOD (PORK) LOCK FLUSH 100 UNIT/ML IV SOLN
500.0000 [IU] | INTRAVENOUS | Status: AC | PRN
  Administered 2014-05-31: 500 [IU]
  Filled 2014-05-31: qty 5

## 2014-05-31 MED ORDER — HEPARIN SOD (PORK) LOCK FLUSH 100 UNIT/ML IV SOLN
250.0000 [IU] | Freq: Once | INTRAVENOUS | Status: DC | PRN
  Filled 2014-05-31: qty 5

## 2014-05-31 MED ORDER — ZOLEDRONIC ACID 4 MG/100ML IV SOLN
4.0000 mg | Freq: Once | INTRAVENOUS | Status: AC
  Administered 2014-05-31: 4 mg via INTRAVENOUS
  Filled 2014-05-31: qty 100

## 2014-05-31 MED ORDER — SODIUM CHLORIDE 0.9 % IV SOLN
INTRAVENOUS | Status: DC
  Administered 2014-05-31: 13:00:00 via INTRAVENOUS

## 2014-05-31 NOTE — Patient Instructions (Signed)

## 2014-06-09 ENCOUNTER — Other Ambulatory Visit: Payer: Self-pay | Admitting: Hematology and Oncology

## 2014-06-09 ENCOUNTER — Telehealth: Payer: Self-pay

## 2014-06-09 ENCOUNTER — Encounter: Payer: Self-pay | Admitting: Hematology and Oncology

## 2014-06-09 ENCOUNTER — Ambulatory Visit (HOSPITAL_BASED_OUTPATIENT_CLINIC_OR_DEPARTMENT_OTHER): Payer: 59 | Admitting: Hematology and Oncology

## 2014-06-09 ENCOUNTER — Other Ambulatory Visit (HOSPITAL_BASED_OUTPATIENT_CLINIC_OR_DEPARTMENT_OTHER): Payer: 59

## 2014-06-09 ENCOUNTER — Telehealth: Payer: Self-pay | Admitting: *Deleted

## 2014-06-09 VITALS — BP 129/79 | HR 92 | Temp 98.9°F | Resp 18 | Wt 127.3 lb

## 2014-06-09 DIAGNOSIS — C9001 Multiple myeloma in remission: Secondary | ICD-10-CM

## 2014-06-09 DIAGNOSIS — R6883 Chills (without fever): Secondary | ICD-10-CM

## 2014-06-09 DIAGNOSIS — R05 Cough: Secondary | ICD-10-CM

## 2014-06-09 DIAGNOSIS — R300 Dysuria: Secondary | ICD-10-CM

## 2014-06-09 DIAGNOSIS — R198 Other specified symptoms and signs involving the digestive system and abdomen: Secondary | ICD-10-CM | POA: Diagnosis not present

## 2014-06-09 DIAGNOSIS — R059 Cough, unspecified: Secondary | ICD-10-CM

## 2014-06-09 HISTORY — DX: Dysuria: R30.0

## 2014-06-09 HISTORY — DX: Cough, unspecified: R05.9

## 2014-06-09 LAB — CBC WITH DIFFERENTIAL/PLATELET
BASO%: 0.7 % (ref 0.0–2.0)
BASOS ABS: 0 10*3/uL (ref 0.0–0.1)
EOS ABS: 0.1 10*3/uL (ref 0.0–0.5)
EOS%: 1.2 % (ref 0.0–7.0)
HEMATOCRIT: 40.1 % (ref 34.8–46.6)
HEMOGLOBIN: 13.3 g/dL (ref 11.6–15.9)
LYMPH#: 1.1 10*3/uL (ref 0.9–3.3)
LYMPH%: 19.3 % (ref 14.0–49.7)
MCH: 32.4 pg (ref 25.1–34.0)
MCHC: 33.1 g/dL (ref 31.5–36.0)
MCV: 97.7 fL (ref 79.5–101.0)
MONO#: 0.3 10*3/uL (ref 0.1–0.9)
MONO%: 6.1 % (ref 0.0–14.0)
NEUT%: 72.7 % (ref 38.4–76.8)
NEUTROS ABS: 4 10*3/uL (ref 1.5–6.5)
Platelets: 143 10*3/uL — ABNORMAL LOW (ref 145–400)
RBC: 4.1 10*6/uL (ref 3.70–5.45)
RDW: 12.1 % (ref 11.2–14.5)
WBC: 5.5 10*3/uL (ref 3.9–10.3)

## 2014-06-09 LAB — URINALYSIS, MICROSCOPIC - CHCC
BILIRUBIN (URINE): NEGATIVE
Blood: NEGATIVE
Glucose: NEGATIVE mg/dL
Ketones: NEGATIVE mg/dL
NITRITE: NEGATIVE
Protein: NEGATIVE mg/dL
Specific Gravity, Urine: 1.015 (ref 1.003–1.035)
UROBILINOGEN UR: 0.2 mg/dL (ref 0.2–1)
pH: 6 (ref 4.6–8.0)

## 2014-06-09 LAB — COMPREHENSIVE METABOLIC PANEL (CC13)
ALK PHOS: 56 U/L (ref 40–150)
ALT: 12 U/L (ref 0–55)
AST: 22 U/L (ref 5–34)
Albumin: 4.1 g/dL (ref 3.5–5.0)
Anion Gap: 9 mEq/L (ref 3–11)
BUN: 9.7 mg/dL (ref 7.0–26.0)
CHLORIDE: 108 meq/L (ref 98–109)
CO2: 25 mEq/L (ref 22–29)
Calcium: 8.5 mg/dL (ref 8.4–10.4)
Creatinine: 0.8 mg/dL (ref 0.6–1.1)
EGFR: 85 mL/min/{1.73_m2} — ABNORMAL LOW (ref >90)
GLUCOSE: 97 mg/dL (ref 70–140)
POTASSIUM: 3.7 meq/L (ref 3.5–5.1)
Sodium: 141 mEq/L (ref 136–145)
Total Bilirubin: 0.81 mg/dL (ref 0.20–1.20)
Total Protein: 6.2 g/dL — ABNORMAL LOW (ref 6.4–8.3)

## 2014-06-09 LAB — LACTATE DEHYDROGENASE (CC13): LDH: 224 U/L (ref 125–245)

## 2014-06-09 NOTE — Telephone Encounter (Signed)
Saw Dr. Calton Dach second reply about seeing pt today after I spoke w/ pt.   I  Called pt back and informed her Dr. Alvy Bimler has agreed to see her today if she can come in and get CXR and lab before 2:30 p.m.  Pt questions why she needs CXR and says she thinks this has all been "blown out of proportion."  She denies any resp symptoms,  States she is scared if she goes to PCP she will get a resp infection. She is c/o of decreased appetite, nausea,  Rectal discharge, chills and heavy feeling in head.

## 2014-06-09 NOTE — Telephone Encounter (Signed)
Pt called stating she has not felt well for about 1 week. She has chills and heavy head feeling. Her thermometer is not working. She has also been having mucousy rectal discharge off and on for the last week. Has decreased appetite. No stomach cramping but it does have a rolling feeling. She received zometa on 3/23. She recently stopped her bactim thrice weekly and is weaning off her valcyclovir and taking about 2-3x/week.

## 2014-06-09 NOTE — Telephone Encounter (Signed)
Informed Dr. Alvy Bimler of below and she says pt does not need to have CXR done,  Just lab first and then she will see her.   Called pt back and left VM informing pt of this and to please be here by 2 pm latest for lab, then Dr. Alvy Bimler.  Also called call phone number and s/w husband, gave him same instructions.  He will try to call pt and give her the information as well.

## 2014-06-09 NOTE — Telephone Encounter (Signed)
Called pt regarding her symptoms and Dr. Calton Dach recommendation she see her PCP.   Explained to pt it is very appropriate for her to see her PCP in this situation and urged her to call them for appt.  Pt verbalized understanding, although she basically said she probably wouldn't do this.  She does not want to be exposed to other people that are sick by going to PCP.   Explained it is up to her but Dr. Alvy Bimler recommends she be seen by PCP.

## 2014-06-09 NOTE — Telephone Encounter (Signed)
Selena Lesser NP symptom management is not in the office today. Pt given instructions from dr Alvy Bimler. Pt stated, "I am adamant about not going to PCP. 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  I am afraid of getting a respiratory infection that will progress to pneumonia. I  had a friend who recently got resp infection and died of pneumonia. Thank you for no help at all. I will just stay home and die in my bed." This is routed back to Dr Alvy Bimler.

## 2014-06-09 NOTE — Telephone Encounter (Signed)
Send to PCP Stay on clear liquids for now Try Claritin 10 mg daily for next 3 days If not better call back next Monday

## 2014-06-09 NOTE — Telephone Encounter (Signed)
Duplicate, see previous phone note.  

## 2014-06-09 NOTE — Telephone Encounter (Signed)
It looks like I do not have a choice but to add her on today. I will order CXR & have her come in for labs & CXR before her appt Please get her to come in before 230 pm because I am on call

## 2014-06-11 DIAGNOSIS — R6883 Chills (without fever): Secondary | ICD-10-CM | POA: Insufficient documentation

## 2014-06-11 DIAGNOSIS — R198 Other specified symptoms and signs involving the digestive system and abdomen: Secondary | ICD-10-CM | POA: Insufficient documentation

## 2014-06-11 LAB — URINE CULTURE

## 2014-06-11 NOTE — Progress Notes (Signed)
[place] OFFICE PROGRESS NOTE  Patient Care Team: [name], MD as PCP - General [name], MD as Consulting Physician (Oncology) [name], MD as Consulting Physician (Neurosurgery) [name], MD as Consulting Physician (Hematology and Oncology)  SUMMARY OF ONCOLOGIC HISTORY:  She was diagnosed with kappa light chain multiple myeloma in May 2009 when she presented with back pain, anemia, hypercalcemia, and proteinuria. Briefly, she received induction therapy with RVD and achieved an excellent response. She went on to receive high-dose IV melphalan with autologous stem cell support in October 2009 at [facility]. She received monthly Zometa for the first 2 years and then changed to an every 6 month schedule.  She showed signs of early progression in June 2013 with rising kappa free light chains in the serum and appearance of monoclonal proteins in the urine. Bone marrow aspirate and biopsy done 09/25/2011 showed only 4% plasma cells. Negative cytogenetic studies. She was put her on treatment with a combination of Revlimid 25 mg 3 weeks on one-week off plus dexamethasone 40 mg by mouth weekly beginning in August 2013. She had a nice response with fall in her paraprotein levels back to normal and in January 2014, with modified schedule by deleting the steroids and dropping the Revlimid down to a single 10 mg daily dose. At her request, prior hematologist further decreased her Revlimid to 3 weeks on 1 week off.  She was receiving Zometa on an every six-month basis.  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 She made an informed decision to stop Revlimid and go on observation only.  INTERVAL HISTORY: Please see below for problem oriented charting. She is seen urgently today because of complaints of chills & rectal discharge. She did not want to go to PCP for evaluation because she thinks she will  catch infection if she sees her PCP. After a very elaborate discussion, I was able to summary her complaints into 2 issues: 1) chills. This is not accompanied by fever, cough, sore throat, dysuria or diarrhea. This started a few days ago after her recent zometa infusion 2) Intermittent rectal discharge. This is distressing to her and embarrassing. She did not think she has fecal incontinence. Not vaginal discharge. The amount is small and described as mixed with mucous. No blood. Has happened intermittently since last week  REVIEW OF SYSTEMS:   Constitutional: Denies fevers or abnormal weight loss Eyes: Denies blurriness of vision Ears, nose, mouth, throat, and face: Denies mucositis or sore throat Respiratory: Denies cough, dyspnea or wheezes Cardiovascular: Denies palpitation, chest discomfort or lower extremity swelling Gastrointestinal:  Denies nausea, heartburn or change in bowel habits Skin: Denies abnormal skin rashes Lymphatics: Denies new lymphadenopathy or easy bruising Neurological:Denies numbness, tingling or new weaknesses Behavioral/Psych: Mood is stable, no new changes  All other systems were reviewed with the patient and are negative.  I have reviewed the past medical history, past surgical history, social history and family history with the patient and they are unchanged from previous note.  ALLERGIES:  is allergic to hydromorphone.  MEDICATIONS:  Current Outpatient Prescriptions  Medication Sig Dispense Refill  . aspirin 81 MG tablet Take 81 mg by mouth.    . cholecalciferol (VITAMIN D) 1000 UNITS tablet Take 1,000 Units by mouth daily.    . Multiple Vitamins-Minerals (ONE-A-DAY 50 PLUS PO) Take by mouth daily.    Marland Kitchen oxycodone (OXY-IR) 5 MG capsule Take 5 mg by mouth every  4 (four) hours as needed. pain    . Oxycodone HCl (OXYCONTIN) 60 MG TB12 Take 1 tablet by mouth 2 (two) times daily.    . pantoprazole (PROTONIX) 40 MG tablet TAKE (1) TABLET DAILY AS NEEDED. 30  tablet 3  . valACYclovir (VALTREX) 500 MG tablet TAKE 1 TABLET BY MOUTH DAILY. 30 tablet 11   No current facility-administered medications for this visit.    PHYSICAL EXAMINATION: ECOG PERFORMANCE STATUS: 0 - Asymptomatic  Filed Vitals:   06/09/14 1447  BP: 129/79  Pulse: 92  Temp: 98.9 F (37.2 C)  Resp: 18   Filed Weights   06/09/14 1447  Weight: 127 lb 4.8 oz (57.743 kg)    GENERAL:alert, no distress and comfortable SKIN: skin color, texture, turgor are normal, no rashes or significant lesions EYES: normal, Conjunctiva are pink and non-injected, sclera clear OROPHARYNX:no exudate, no erythema and lips, buccal mucosa, and tongue normal  NECK: supple, thyroid normal size, non-tender, without nodularity LYMPH:  no palpable lymphadenopathy in the cervical, axillary or inguinal LUNGS: clear to auscultation and percussion with normal breathing effort HEART: regular rate & rhythm and no murmurs and no lower extremity edema ABDOMEN:abdomen soft, non-tender and normal bowel sounds Musculoskeletal:no cyanosis of digits and no clubbing  NEURO: alert & oriented x 3 with fluent speech, no focal motor/sensory deficits Rectal examination is normal, no surrounding fistula or hemorrhoids LABORATORY DATA:  I have reviewed the data as listed    Component Value Date/Time   NA 141 06/09/2014 1430   NA 143 10/10/2011 1023   K 3.7 06/09/2014 1430   K 3.8 10/10/2011 1023   CL 107 08/30/2012 1055   CL 105 10/10/2011 1023   CO2 25 06/09/2014 1430   CO2 29 10/10/2011 1023   GLUCOSE 97 06/09/2014 1430   GLUCOSE 102* 08/30/2012 1055   GLUCOSE 74 10/10/2011 1023   BUN 9.7 06/09/2014 1430   BUN 16 10/10/2011 1023   CREATININE 0.8 06/09/2014 1430   CREATININE 0.83 10/10/2011 1023   CREATININE 0.75 07/17/2009 1541   CALCIUM 8.5 06/09/2014 1430   CALCIUM 9.3 10/10/2011 1023   PROT 6.2* 06/09/2014 1430   PROT 6.7 08/19/2011 1353   ALBUMIN 4.1 06/09/2014 1430   ALBUMIN 4.2 08/19/2011 1353    AST 22 06/09/2014 1430   AST 23 08/19/2011 1353   ALT 12 06/09/2014 1430   ALT 11 08/19/2011 1353   ALKPHOS 56 06/09/2014 1430   ALKPHOS 54 08/19/2011 1353   BILITOT 0.81 06/09/2014 1430   BILITOT 0.3 08/19/2011 1353    No results found for: SPEP, UPEP  Lab Results  Component Value Date   WBC 5.5 06/09/2014   NEUTROABS 4.0 06/09/2014   HGB 13.3 06/09/2014   HCT 40.1 06/09/2014   MCV 97.7 06/09/2014   PLT 143* 06/09/2014      Chemistry      Component Value Date/Time   NA 141 06/09/2014 1430   NA 143 10/10/2011 1023   K 3.7 06/09/2014 1430   K 3.8 10/10/2011 1023   CL 107 08/30/2012 1055   CL 105 10/10/2011 1023   CO2 25 06/09/2014 1430   CO2 29 10/10/2011 1023   BUN 9.7 06/09/2014 1430   BUN 16 10/10/2011 1023   CREATININE 0.8 06/09/2014 1430   CREATININE 0.83 10/10/2011 1023   CREATININE 0.75 07/17/2009 1541      Component Value Date/Time   CALCIUM 8.5 06/09/2014 1430   CALCIUM 9.3 10/10/2011 1023   ALKPHOS 56 06/09/2014 1430  ALKPHOS 54 08/19/2011 1353   AST 22 06/09/2014 1430   AST 23 08/19/2011 1353   ALT 12 06/09/2014 1430   ALT 11 08/19/2011 1353   BILITOT 0.81 06/09/2014 1430   BILITOT 0.3 08/19/2011 1353      ASSESSMENT & PLAN:  Multiple myeloma in remission No acute issues in this regard. Continue observation   Chills (without fever) This unlikely due to infection but just to be sure will order infectious work-up including urine test and culture. Another possibility is whether this could be mild reaction to recent Zometa infusion. Recommend a trial of Claritin   Rectal discharge The history is peculiar. Her old CT scan suggested uterine fibroids and I'm wondering whether she could be confusing this with vaginal discharge but she is adamant she felt this is most compatible with rectal discharge. Examination did not reveal any signs of fistula. I recommend either observation versus CT scan. She would like to go home and think about  it    Orders Placed This Encounter  Procedures  . Urine culture    Standing Status: Future     Number of Occurrences: 1     Standing Expiration Date: 07/14/2015  . Urinalysis, Microscopic - [facility]    Standing Status: Future     Number of Occurrences: 1     Standing Expiration Date: 07/14/2015   All questions were answered. The patient knows to call the clinic with any problems, questions or concerns. No barriers to learning was detected. I spent 25 minutes counseling the patient face to face. The total time spent in the appointment was 30 minutes and more than 50% was on counseling and review of test results     [facility], [place], MD 06/11/2014 4:49 PM

## 2014-06-11 NOTE — Assessment & Plan Note (Signed)
The history is peculiar. Her old CT scan suggested uterine fibroids and I'm wondering whether she could be confusing this with vaginal discharge but she is adamant she felt this is most compatible with rectal discharge. Examination did not reveal any signs of fistula. I recommend either observation versus CT scan. She would like to go home and think about it

## 2014-06-11 NOTE — Assessment & Plan Note (Signed)
This unlikely due to infection but just to be sure will order infectious work-up including urine test and culture. Another possibility is whether this could be mild reaction to recent Zometa infusion. Recommend a trial of Claritin

## 2014-06-11 NOTE — Assessment & Plan Note (Signed)
No acute issues in this regard. Continue observation

## 2014-06-23 ENCOUNTER — Telehealth: Payer: Self-pay | Admitting: *Deleted

## 2014-06-23 NOTE — Telephone Encounter (Signed)
Pt called to report to Dr. Alvy Bimler she felt better the day after she saw Dr. Alvy Bimler 2 weeks ago and her symptoms have not returned.   Pt asks Dr. Alvy Bimler if she will order a Pelvic CT in June?  States it was discussed on her office visit. Pt also requested some changes to her flush and lab appts in May.  POF has been sent to r/s lab/flush per pt request.

## 2014-06-26 ENCOUNTER — Telehealth: Payer: Self-pay | Admitting: Hematology and Oncology

## 2014-06-26 NOTE — Telephone Encounter (Signed)
s.w. pt and confirmed 5.25 appt cx and moved to 5.11...pt ok and aware

## 2014-06-26 NOTE — Telephone Encounter (Signed)
If her symptoms have resolved there is no need for CT unless she is still concerned about her old CT findings. If she wants CT I should order before she returns to see me next visit

## 2014-06-30 ENCOUNTER — Telehealth: Payer: Self-pay | Admitting: *Deleted

## 2014-06-30 NOTE — Telephone Encounter (Signed)
Pt had inquired about getting CT scan done in June and Dr. Alvy Bimler replied; "If her symptoms have resolved there is no need for CT unless she is still concerned about her old CT findings. If she wants CT I should order before she returns to see me next visit." I called pt to notify her of Dr. Calton Dach response and to see if she still wants to have CT ordered.   I left VM for pt to return nurse's call.

## 2014-06-30 NOTE — Telephone Encounter (Signed)
Pt returning call to [place] - " she can call me at home - I will be here all afternoon "  This RN informed [place] of above.

## 2014-06-30 NOTE — Telephone Encounter (Signed)
Pt states she thinks she would feel better if Dr. Alvy Bimler would order a CT scan and she would prefer to have it done in June maybe same day as her flush appt which is scheduled for 6/22.

## 2014-07-03 ENCOUNTER — Telehealth: Payer: Self-pay | Admitting: *Deleted

## 2014-07-03 ENCOUNTER — Other Ambulatory Visit: Payer: Self-pay | Admitting: Hematology and Oncology

## 2014-07-03 DIAGNOSIS — R198 Other specified symptoms and signs involving the digestive system and abdomen: Secondary | ICD-10-CM

## 2014-07-03 DIAGNOSIS — R1900 Intra-abdominal and pelvic swelling, mass and lump, unspecified site: Secondary | ICD-10-CM

## 2014-07-03 NOTE — Telephone Encounter (Signed)
Left message -Dr Alvy Bimler has placed an order for CT on 6/22

## 2014-07-03 NOTE — Telephone Encounter (Signed)
I will place order for CT to be done on 6/22

## 2014-07-19 ENCOUNTER — Ambulatory Visit (HOSPITAL_BASED_OUTPATIENT_CLINIC_OR_DEPARTMENT_OTHER): Payer: 59

## 2014-07-19 ENCOUNTER — Other Ambulatory Visit (HOSPITAL_BASED_OUTPATIENT_CLINIC_OR_DEPARTMENT_OTHER): Payer: 59

## 2014-07-19 VITALS — BP 127/77 | HR 100 | Temp 98.7°F

## 2014-07-19 DIAGNOSIS — Z452 Encounter for adjustment and management of vascular access device: Secondary | ICD-10-CM

## 2014-07-19 DIAGNOSIS — Z95828 Presence of other vascular implants and grafts: Secondary | ICD-10-CM

## 2014-07-19 DIAGNOSIS — C9001 Multiple myeloma in remission: Secondary | ICD-10-CM

## 2014-07-19 LAB — CBC WITH DIFFERENTIAL/PLATELET
BASO%: 0.9 % (ref 0.0–2.0)
Basophils Absolute: 0 10*3/uL (ref 0.0–0.1)
EOS%: 5.3 % (ref 0.0–7.0)
Eosinophils Absolute: 0.2 10*3/uL (ref 0.0–0.5)
HCT: 40.2 % (ref 34.8–46.6)
HEMOGLOBIN: 13.4 g/dL (ref 11.6–15.9)
LYMPH%: 34 % (ref 14.0–49.7)
MCH: 32.8 pg (ref 25.1–34.0)
MCHC: 33.5 g/dL (ref 31.5–36.0)
MCV: 98.2 fL (ref 79.5–101.0)
MONO#: 0.3 10*3/uL (ref 0.1–0.9)
MONO%: 6.8 % (ref 0.0–14.0)
NEUT#: 2.2 10*3/uL (ref 1.5–6.5)
NEUT%: 53 % (ref 38.4–76.8)
Platelets: 140 10*3/uL — ABNORMAL LOW (ref 145–400)
RBC: 4.09 10*6/uL (ref 3.70–5.45)
RDW: 12.5 % (ref 11.2–14.5)
WBC: 4.1 10*3/uL (ref 3.9–10.3)
lymph#: 1.4 10*3/uL (ref 0.9–3.3)

## 2014-07-19 LAB — COMPREHENSIVE METABOLIC PANEL (CC13)
ALBUMIN: 4.2 g/dL (ref 3.5–5.0)
ALT: 12 U/L (ref 0–55)
AST: 24 U/L (ref 5–34)
Alkaline Phosphatase: 60 U/L (ref 40–150)
Anion Gap: 8 mEq/L (ref 3–11)
BILIRUBIN TOTAL: 0.49 mg/dL (ref 0.20–1.20)
BUN: 15.8 mg/dL (ref 7.0–26.0)
CO2: 27 meq/L (ref 22–29)
CREATININE: 0.8 mg/dL (ref 0.6–1.1)
Calcium: 8.4 mg/dL (ref 8.4–10.4)
Chloride: 106 mEq/L (ref 98–109)
Glucose: 87 mg/dl (ref 70–140)
Potassium: 4.1 mEq/L (ref 3.5–5.1)
Sodium: 141 mEq/L (ref 136–145)
TOTAL PROTEIN: 6.4 g/dL (ref 6.4–8.3)

## 2014-07-19 MED ORDER — HEPARIN SOD (PORK) LOCK FLUSH 100 UNIT/ML IV SOLN
500.0000 [IU] | Freq: Once | INTRAVENOUS | Status: AC
  Administered 2014-07-19: 500 [IU] via INTRAVENOUS
  Filled 2014-07-19: qty 5

## 2014-07-19 MED ORDER — SODIUM CHLORIDE 0.9 % IJ SOLN
10.0000 mL | INTRAMUSCULAR | Status: DC | PRN
  Administered 2014-07-19: 10 mL via INTRAVENOUS
  Filled 2014-07-19: qty 10

## 2014-07-19 NOTE — Patient Instructions (Signed)

## 2014-07-20 LAB — KAPPA/LAMBDA LIGHT CHAINS
Kappa free light chain: 1.27 mg/dL (ref 0.33–1.94)
Kappa:Lambda Ratio: 4.23 — ABNORMAL HIGH (ref 0.26–1.65)
Lambda Free Lght Chn: 0.3 mg/dL — ABNORMAL LOW (ref 0.57–2.63)

## 2014-07-21 LAB — SPEP & IFE WITH QIG
ALPHA-1-GLOBULIN: 0.3 g/dL (ref 0.2–0.3)
ALPHA-2-GLOBULIN: 0.7 g/dL (ref 0.5–0.9)
Albumin ELP: 4.4 g/dL (ref 3.8–4.8)
Beta 2: 0.2 g/dL (ref 0.2–0.5)
Beta Globulin: 0.4 g/dL (ref 0.4–0.6)
Gamma Globulin: 0.5 g/dL — ABNORMAL LOW (ref 0.8–1.7)
IGG (IMMUNOGLOBIN G), SERUM: 563 mg/dL — AB (ref 690–1700)
IGM, SERUM: 53 mg/dL (ref 52–322)
IgA: 29 mg/dL — ABNORMAL LOW (ref 69–380)
Total Protein, Serum Electrophoresis: 6.5 g/dL (ref 6.1–8.1)

## 2014-07-21 LAB — BETA 2 MICROGLOBULIN, SERUM: Beta-2 Microglobulin: 1.77 mg/L (ref <=2.51)

## 2014-08-02 ENCOUNTER — Other Ambulatory Visit: Payer: 59

## 2014-08-25 ENCOUNTER — Telehealth: Payer: Self-pay | Admitting: Hematology and Oncology

## 2014-08-25 NOTE — Telephone Encounter (Signed)
returned call and confirmed appts.Marland KitchenMarland KitchenMarland KitchenMarland Kitchenpt wanted flush to be moved done

## 2014-08-28 ENCOUNTER — Other Ambulatory Visit: Payer: Self-pay | Admitting: Hematology and Oncology

## 2014-08-28 ENCOUNTER — Encounter: Payer: Self-pay | Admitting: Hematology and Oncology

## 2014-08-28 DIAGNOSIS — R198 Other specified symptoms and signs involving the digestive system and abdomen: Secondary | ICD-10-CM

## 2014-08-28 DIAGNOSIS — R1900 Intra-abdominal and pelvic swelling, mass and lump, unspecified site: Secondary | ICD-10-CM

## 2014-08-28 HISTORY — DX: Intra-abdominal and pelvic swelling, mass and lump, unspecified site: R19.00

## 2014-08-30 ENCOUNTER — Telehealth: Payer: Self-pay | Admitting: *Deleted

## 2014-08-30 NOTE — Telephone Encounter (Signed)
Pt had some questions about her CT scan.  She wants to make sure Dr. Alvy Bimler will be able to look at her "uterus" on scan.  Pt is concerned since she was instructed to drink Contrast and she thought that was only to look at her intestines.   Explained to pt that the contrast does highlight the intestines but also provides a contrast to be able to see her other organs better in contrast to her intestines.  Informed pt CT ordered is for abd and pelvis so radiologist will be able to see pt's Uterus on the scan.   Pt verbalized understanding.

## 2014-08-31 ENCOUNTER — Telehealth: Payer: Self-pay | Admitting: *Deleted

## 2014-08-31 ENCOUNTER — Encounter (HOSPITAL_COMMUNITY): Payer: Self-pay

## 2014-08-31 ENCOUNTER — Ambulatory Visit (HOSPITAL_COMMUNITY)
Admission: RE | Admit: 2014-08-31 | Discharge: 2014-08-31 | Disposition: A | Discharge status: Home / Self Care | Payer: 59 | Source: Ambulatory Visit | Attending: Hematology and Oncology | Admitting: Hematology and Oncology

## 2014-08-31 ENCOUNTER — Ambulatory Visit (HOSPITAL_COMMUNITY): Payer: 59

## 2014-08-31 DIAGNOSIS — Z9221 Personal history of antineoplastic chemotherapy: Secondary | ICD-10-CM | POA: Diagnosis not present

## 2014-08-31 DIAGNOSIS — R1900 Intra-abdominal and pelvic swelling, mass and lump, unspecified site: Secondary | ICD-10-CM

## 2014-08-31 DIAGNOSIS — C9000 Multiple myeloma not having achieved remission: Secondary | ICD-10-CM | POA: Insufficient documentation

## 2014-08-31 DIAGNOSIS — N840 Polyp of corpus uteri: Secondary | ICD-10-CM | POA: Diagnosis present

## 2014-08-31 DIAGNOSIS — Z9484 Stem cells transplant status: Secondary | ICD-10-CM | POA: Diagnosis not present

## 2014-08-31 DIAGNOSIS — R198 Other specified symptoms and signs involving the digestive system and abdomen: Secondary | ICD-10-CM

## 2014-08-31 DIAGNOSIS — R938 Abnormal findings on diagnostic imaging of other specified body structures: Secondary | ICD-10-CM | POA: Diagnosis not present

## 2014-08-31 MED ORDER — IOHEXOL 300 MG/ML  SOLN
100.0000 mL | Freq: Once | INTRAMUSCULAR | Status: AC | PRN
  Administered 2014-08-31: 100 mL via INTRAVENOUS

## 2014-08-31 NOTE — Telephone Encounter (Signed)
Informed pt of results and will mail her a copy.  She verbalized understanding.

## 2014-08-31 NOTE — Telephone Encounter (Signed)
-----   Message from [name], MD sent at 08/31/2014 12:49 PM EDT ----- Regarding: CT scan neg Likely fibroids, stable from 2009, no change Please mail her a copy of report

## 2014-09-06 ENCOUNTER — Ambulatory Visit (HOSPITAL_BASED_OUTPATIENT_CLINIC_OR_DEPARTMENT_OTHER): Payer: 59

## 2014-09-06 VITALS — BP 120/80 | HR 112 | Temp 98.9°F | Resp 18

## 2014-09-06 DIAGNOSIS — C9001 Multiple myeloma in remission: Secondary | ICD-10-CM | POA: Diagnosis not present

## 2014-09-06 DIAGNOSIS — Z95828 Presence of other vascular implants and grafts: Secondary | ICD-10-CM

## 2014-09-06 DIAGNOSIS — Z452 Encounter for adjustment and management of vascular access device: Secondary | ICD-10-CM

## 2014-09-06 MED ORDER — HEPARIN SOD (PORK) LOCK FLUSH 100 UNIT/ML IV SOLN
500.0000 [IU] | Freq: Once | INTRAVENOUS | Status: AC
  Administered 2014-09-06: 500 [IU] via INTRAVENOUS
  Filled 2014-09-06: qty 5

## 2014-09-06 MED ORDER — SODIUM CHLORIDE 0.9 % IJ SOLN
10.0000 mL | INTRAMUSCULAR | Status: DC | PRN
  Administered 2014-09-06: 10 mL via INTRAVENOUS
  Filled 2014-09-06: qty 10

## 2014-09-06 NOTE — Patient Instructions (Signed)

## 2014-10-11 ENCOUNTER — Ambulatory Visit (HOSPITAL_BASED_OUTPATIENT_CLINIC_OR_DEPARTMENT_OTHER): Payer: 59

## 2014-10-11 VITALS — BP 115/84 | HR 91 | Temp 97.8°F | Resp 16

## 2014-10-11 DIAGNOSIS — Z452 Encounter for adjustment and management of vascular access device: Secondary | ICD-10-CM | POA: Diagnosis not present

## 2014-10-11 DIAGNOSIS — Z95828 Presence of other vascular implants and grafts: Secondary | ICD-10-CM

## 2014-10-11 DIAGNOSIS — C9001 Multiple myeloma in remission: Secondary | ICD-10-CM | POA: Diagnosis not present

## 2014-10-11 MED ORDER — HEPARIN SOD (PORK) LOCK FLUSH 100 UNIT/ML IV SOLN
500.0000 [IU] | Freq: Once | INTRAVENOUS | Status: AC
  Administered 2014-10-11: 500 [IU] via INTRAVENOUS
  Filled 2014-10-11: qty 5

## 2014-10-11 MED ORDER — SODIUM CHLORIDE 0.9 % IJ SOLN
10.0000 mL | INTRAMUSCULAR | Status: DC | PRN
  Administered 2014-10-11: 10 mL via INTRAVENOUS
  Filled 2014-10-11: qty 10

## 2014-10-11 NOTE — Patient Instructions (Signed)

## 2014-10-24 ENCOUNTER — Telehealth: Payer: Self-pay | Admitting: *Deleted

## 2014-10-24 ENCOUNTER — Other Ambulatory Visit: Payer: Self-pay | Admitting: Hematology and Oncology

## 2014-10-24 ENCOUNTER — Telehealth: Payer: Self-pay | Admitting: Hematology and Oncology

## 2014-10-24 DIAGNOSIS — C9002 Multiple myeloma in relapse: Secondary | ICD-10-CM

## 2014-10-24 NOTE — Telephone Encounter (Signed)
TC from patient this am requesting a lab appt for her every 3 month 'big' labs needed by [facility] to be done @ the end of August, while still keeping lab, MD appt and  Zometa treatment for 11/30/14.

## 2014-10-24 NOTE — Telephone Encounter (Signed)
I could not see any notes I cancelled her lab appt on 9/22 and ordered labs on 8/31 I typically don't order 24 hour urine collection unless the [facility] "big labs" also wants to include 24 hour urine collection

## 2014-10-24 NOTE — Telephone Encounter (Signed)
Appointments made and patient called  Jessica Cochran °

## 2014-11-08 ENCOUNTER — Other Ambulatory Visit (HOSPITAL_BASED_OUTPATIENT_CLINIC_OR_DEPARTMENT_OTHER): Payer: 59

## 2014-11-08 DIAGNOSIS — C9001 Multiple myeloma in remission: Secondary | ICD-10-CM

## 2014-11-08 DIAGNOSIS — C9002 Multiple myeloma in relapse: Secondary | ICD-10-CM

## 2014-11-08 LAB — COMPREHENSIVE METABOLIC PANEL (CC13)
ALBUMIN: 4 g/dL (ref 3.5–5.0)
ALT: 10 U/L (ref 0–55)
AST: 23 U/L (ref 5–34)
Alkaline Phosphatase: 68 U/L (ref 40–150)
Anion Gap: 7 mEq/L (ref 3–11)
BUN: 14.7 mg/dL (ref 7.0–26.0)
CHLORIDE: 104 meq/L (ref 98–109)
CO2: 30 mEq/L — ABNORMAL HIGH (ref 22–29)
CREATININE: 0.8 mg/dL (ref 0.6–1.1)
Calcium: 8.8 mg/dL (ref 8.4–10.4)
EGFR: 82 mL/min/{1.73_m2} — ABNORMAL LOW (ref >90)
GLUCOSE: 86 mg/dL (ref 70–140)
POTASSIUM: 3.8 meq/L (ref 3.5–5.1)
SODIUM: 141 meq/L (ref 136–145)
Total Bilirubin: 0.43 mg/dL (ref 0.20–1.20)
Total Protein: 6.1 g/dL — ABNORMAL LOW (ref 6.4–8.3)

## 2014-11-08 LAB — CBC WITH DIFFERENTIAL/PLATELET
BASO%: 1 % (ref 0.0–2.0)
BASOS ABS: 0 10*3/uL (ref 0.0–0.1)
EOS ABS: 0.2 10*3/uL (ref 0.0–0.5)
EOS%: 4.1 % (ref 0.0–7.0)
HCT: 38.5 % (ref 34.8–46.6)
HGB: 12.8 g/dL (ref 11.6–15.9)
LYMPH%: 33.1 % (ref 14.0–49.7)
MCH: 32.5 pg (ref 25.1–34.0)
MCHC: 33.4 g/dL (ref 31.5–36.0)
MCV: 97.5 fL (ref 79.5–101.0)
MONO#: 0.6 10*3/uL (ref 0.1–0.9)
MONO%: 11.6 % (ref 0.0–14.0)
NEUT#: 2.4 10*3/uL (ref 1.5–6.5)
NEUT%: 50.2 % (ref 38.4–76.8)
PLATELETS: 137 10*3/uL — AB (ref 145–400)
RBC: 3.95 10*6/uL (ref 3.70–5.45)
RDW: 12.3 % (ref 11.2–14.5)
WBC: 4.8 10*3/uL (ref 3.9–10.3)
lymph#: 1.6 10*3/uL (ref 0.9–3.3)

## 2014-11-08 LAB — LACTATE DEHYDROGENASE (CC13): LDH: 217 U/L (ref 125–245)

## 2014-11-10 LAB — SPEP & IFE WITH QIG
ALPHA-1-GLOBULIN: 0.3 g/dL (ref 0.2–0.3)
ALPHA-2-GLOBULIN: 0.6 g/dL (ref 0.5–0.9)
Albumin ELP: 4.1 g/dL (ref 3.8–4.8)
BETA GLOBULIN: 0.3 g/dL — AB (ref 0.4–0.6)
Beta 2: 0.2 g/dL (ref 0.2–0.5)
GAMMA GLOBULIN: 0.5 g/dL — AB (ref 0.8–1.7)
IgA: 29 mg/dL — ABNORMAL LOW (ref 69–380)
IgG (Immunoglobin G), Serum: 499 mg/dL — ABNORMAL LOW (ref 690–1700)
IgM, Serum: 47 mg/dL — ABNORMAL LOW (ref 52–322)
Total Protein, Serum Electrophoresis: 5.9 g/dL — ABNORMAL LOW (ref 6.1–8.1)

## 2014-11-10 LAB — KAPPA/LAMBDA LIGHT CHAINS
KAPPA LAMBDA RATIO: 8.88 — AB (ref 0.26–1.65)
Kappa free light chain: 3.55 mg/dL — ABNORMAL HIGH (ref 0.33–1.94)
Lambda Free Lght Chn: 0.4 mg/dL — ABNORMAL LOW (ref 0.57–2.63)

## 2014-11-10 LAB — BETA 2 MICROGLOBULIN, SERUM: BETA 2 MICROGLOBULIN: 1.89 mg/L (ref <=2.51)

## 2014-11-30 ENCOUNTER — Telehealth: Payer: Self-pay | Admitting: Hematology and Oncology

## 2014-11-30 ENCOUNTER — Ambulatory Visit (HOSPITAL_BASED_OUTPATIENT_CLINIC_OR_DEPARTMENT_OTHER): Payer: 59 | Admitting: Hematology and Oncology

## 2014-11-30 ENCOUNTER — Encounter: Payer: Self-pay | Admitting: Hematology and Oncology

## 2014-11-30 ENCOUNTER — Other Ambulatory Visit: Payer: 59

## 2014-11-30 ENCOUNTER — Ambulatory Visit (HOSPITAL_BASED_OUTPATIENT_CLINIC_OR_DEPARTMENT_OTHER): Payer: 59

## 2014-11-30 VITALS — BP 127/82 | HR 89 | Temp 98.1°F | Resp 18 | Ht 59.0 in | Wt 136.1 lb

## 2014-11-30 DIAGNOSIS — C9001 Multiple myeloma in remission: Secondary | ICD-10-CM | POA: Diagnosis not present

## 2014-11-30 DIAGNOSIS — D696 Thrombocytopenia, unspecified: Secondary | ICD-10-CM

## 2014-11-30 DIAGNOSIS — C9002 Multiple myeloma in relapse: Secondary | ICD-10-CM

## 2014-11-30 DIAGNOSIS — D801 Nonfamilial hypogammaglobulinemia: Secondary | ICD-10-CM

## 2014-11-30 DIAGNOSIS — D839 Common variable immunodeficiency, unspecified: Secondary | ICD-10-CM | POA: Diagnosis not present

## 2014-11-30 MED ORDER — ZOLEDRONIC ACID 4 MG/100ML IV SOLN
4.0000 mg | Freq: Once | INTRAVENOUS | Status: AC
  Administered 2014-11-30: 4 mg via INTRAVENOUS
  Filled 2014-11-30: qty 100

## 2014-11-30 NOTE — Patient Instructions (Signed)

## 2014-11-30 NOTE — Telephone Encounter (Signed)
Pt confirmed labs/ov/Zometa per 09/22 POF, gave pt AVS and Calendar.Cherylann Banas, sent msg to verify Zometa

## 2014-12-01 NOTE — Assessment & Plan Note (Signed)
The cause is unknown. It is mild and there is little change compared from previous platelet count. The patient denies recent history of bleeding such as epistaxis, hematuria or hematochezia. She is asymptomatic from the thrombocytopenia. I will observe for now.  

## 2014-12-01 NOTE — Assessment & Plan Note (Signed)
This is related to her prior treatment. The level of IgG level is improving. I recommend continuing chemotherapy holiday. There is no indication for IVIG for now. 

## 2014-12-01 NOTE — Progress Notes (Signed)
[place] OFFICE PROGRESS NOTE  Patient Care Team: [name], MD as PCP - General [name], MD as Consulting Physician (Oncology) [name], MD as Consulting Physician (Neurosurgery) [name], MD as Consulting Physician (Hematology and Oncology)  SUMMARY OF ONCOLOGIC HISTORY:  She was diagnosed with kappa light chain multiple myeloma in May 2009 when she presented with back pain, anemia, hypercalcemia, and proteinuria. Briefly, she received induction therapy with RVD and achieved an excellent response. She went on to receive high-dose IV melphalan with autologous stem cell support in October 2009 at [facility]. She received monthly Zometa for the first 2 years and then changed to an every 6 month schedule.  She showed signs of early progression in June 2013 with rising kappa free light chains in the serum and appearance of monoclonal proteins in the urine. Bone marrow aspirate and biopsy done 09/25/2011 showed only 4% plasma cells. Negative cytogenetic studies. She was put her on treatment with a combination of Revlimid 25 mg 3 weeks on one-week off plus dexamethasone 40 mg by mouth weekly beginning in August 2013. She had a nice response with fall in her paraprotein levels back to normal and in January 2014, with modified schedule by deleting the steroids and dropping the Revlimid down to a single 10 mg daily dose. At her request, prior hematologist further decreased her Revlimid to 3 weeks on 1 week off.    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 She made an informed decision to stop Revlimid and go on observation only. She continues to receive Zometa every 6 months and she follows frequently at [place]: Please see below for problem oriented charting. She feels well. She continues a mild chronic back pain. Denies recent infection. She denies recent dental  issues.  REVIEW OF SYSTEMS:   Constitutional: Denies fevers, chills or abnormal weight loss Eyes: Denies blurriness of vision Ears, nose, mouth, throat, and face: Denies mucositis or sore throat Respiratory: Denies cough, dyspnea or wheezes Cardiovascular: Denies palpitation, chest discomfort or lower extremity swelling Gastrointestinal:  Denies nausea, heartburn or change in bowel habits Skin: Denies abnormal skin rashes Lymphatics: Denies new lymphadenopathy or easy bruising Neurological:Denies numbness, tingling or new weaknesses Behavioral/Psych: Mood is stable, no new changes  All other systems were reviewed with the patient and are negative.  I have reviewed the past medical history, past surgical history, social history and family history with the patient and they are unchanged from previous note.  ALLERGIES:  is allergic to hydromorphone.  MEDICATIONS:  Current Outpatient Prescriptions  Medication Sig Dispense Refill  . aspirin 81 MG tablet Take 81 mg by mouth.    . cholecalciferol (VITAMIN D) 1000 UNITS tablet Take 1,000 Units by mouth daily.    . Multiple Vitamins-Minerals (ONE-A-DAY 50 PLUS PO) Take by mouth daily.    Marland Kitchen oxycodone (OXY-IR) 5 MG capsule Take 5 mg by mouth every 4 (four) hours as needed. pain    . Oxycodone HCl (OXYCONTIN) 60 MG TB12 Take 1 tablet by mouth 2 (two) times daily.    . pantoprazole (PROTONIX) 40 MG tablet TAKE (1) TABLET DAILY AS NEEDED. 30 tablet 3  . valACYclovir (VALTREX) 500 MG tablet TAKE 1 TABLET BY MOUTH DAILY. 30 tablet 11   No current facility-administered medications for this visit.    PHYSICAL EXAMINATION: ECOG PERFORMANCE STATUS: 0 - Asymptomatic  Filed Vitals:   11/30/14 1128  BP: 127/82  Pulse: 89  Temp: 98.1 F (36.7 C)  Resp: 18   Filed Weights   11/30/14 1128  Weight: 136 lb 1.6 oz (61.735 kg)    GENERAL:alert, no distress and comfortable SKIN: skin color, texture, turgor are normal, no rashes or significant  lesions EYES: normal, Conjunctiva are pink and non-injected, sclera clear OROPHARYNX:no exudate, no erythema and lips, buccal mucosa, and tongue normal  NECK: supple, thyroid normal size, non-tender, without nodularity LYMPH:  no palpable lymphadenopathy in the cervical, axillary or inguinal LUNGS: clear to auscultation and percussion with normal breathing effort HEART: regular rate & rhythm and no murmurs and no lower extremity edema ABDOMEN:abdomen soft, non-tender and normal bowel sounds Musculoskeletal:no cyanosis of digits and no clubbing  NEURO: alert & oriented x 3 with fluent speech, no focal motor/sensory deficits  LABORATORY DATA:  I have reviewed the data as listed    Component Value Date/Time   NA 141 11/08/2014 1026   NA 143 10/10/2011 1023   K 3.8 11/08/2014 1026   K 3.8 10/10/2011 1023   CL 107 08/30/2012 1055   CL 105 10/10/2011 1023   CO2 30* 11/08/2014 1026   CO2 29 10/10/2011 1023   GLUCOSE 86 11/08/2014 1026   GLUCOSE 102* 08/30/2012 1055   GLUCOSE 74 10/10/2011 1023   BUN 14.7 11/08/2014 1026   BUN 16 10/10/2011 1023   CREATININE 0.8 11/08/2014 1026   CREATININE 0.83 10/10/2011 1023   CREATININE 0.75 07/17/2009 1541   CALCIUM 8.8 11/08/2014 1026   CALCIUM 9.3 10/10/2011 1023   PROT 6.1* 11/08/2014 1026   PROT 6.7 08/19/2011 1353   ALBUMIN 4.0 11/08/2014 1026   ALBUMIN 4.2 08/19/2011 1353   AST 23 11/08/2014 1026   AST 23 08/19/2011 1353   ALT 10 11/08/2014 1026   ALT 11 08/19/2011 1353   ALKPHOS 68 11/08/2014 1026   ALKPHOS 54 08/19/2011 1353   BILITOT 0.43 11/08/2014 1026   BILITOT 0.3 08/19/2011 1353    No results found for: SPEP, UPEP  Lab Results  Component Value Date   WBC 4.8 11/08/2014   NEUTROABS 2.4 11/08/2014   HGB 12.8 11/08/2014   HCT 38.5 11/08/2014   MCV 97.5 11/08/2014   PLT 137* 11/08/2014      Chemistry      Component Value Date/Time   NA 141 11/08/2014 1026   NA 143 10/10/2011 1023   K 3.8 11/08/2014 1026   K 3.8  10/10/2011 1023   CL 107 08/30/2012 1055   CL 105 10/10/2011 1023   CO2 30* 11/08/2014 1026   CO2 29 10/10/2011 1023   BUN 14.7 11/08/2014 1026   BUN 16 10/10/2011 1023   CREATININE 0.8 11/08/2014 1026   CREATININE 0.83 10/10/2011 1023   CREATININE 0.75 07/17/2009 1541      Component Value Date/Time   CALCIUM 8.8 11/08/2014 1026   CALCIUM 9.3 10/10/2011 1023   ALKPHOS 68 11/08/2014 1026   ALKPHOS 54 08/19/2011 1353   AST 23 11/08/2014 1026   AST 23 08/19/2011 1353   ALT 10 11/08/2014 1026   ALT 11 08/19/2011 1353   BILITOT 0.43 11/08/2014 1026   BILITOT 0.3 08/19/2011 1353     ASSESSMENT & PLAN:  Multiple myeloma in remission Clinically and from her recent blood work, the patient has minimum elevated light chain but not to the degree that would warrant treatment She has appointment to go back to [facility] for evaluation in November. I recommend we continue on chemotherapy holiday. I will  get her blood rechecked in 6 months. In the meantime, we will continue the Zometa every 6 months. 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 The level of IgG level is improving. I recommend continuing chemotherapy holiday. There is no indication for IVIG for now.  Thrombocytopenia The cause is unknown. It is mild and there is little change compared from previous platelet count. The patient denies recent history of bleeding such as epistaxis, hematuria or hematochezia. She is asymptomatic from the thrombocytopenia. I will observe for now.      Orders Placed This Encounter  Procedures  . CBC with Differential/Platelet    Standing Status: Future     Number of Occurrences:      Standing Expiration Date: 01/04/2016  . Comprehensive metabolic panel    Standing Status: Future     Number of Occurrences:      Standing Expiration Date: 01/04/2016  . SPEP & IFE with QIG    Standing  Status: Future     Number of Occurrences:      Standing Expiration Date: 01/04/2016  . Kappa/lambda light chains    Standing Status: Future     Number of Occurrences:      Standing Expiration Date: 01/04/2016   All questions were answered. The patient knows to call the clinic with any problems, questions or concerns. No barriers to learning was detected. I spent 15 minutes counseling the patient face to face. The total time spent in the appointment was 20 minutes and more than 50% was on counseling and review of test results     [facility], [place], MD 12/01/2014 7:38 AM

## 2014-12-01 NOTE — Assessment & Plan Note (Signed)
Clinically and from her recent blood work, the patient has minimum elevated light chain but not to the degree that would warrant treatment She has appointment to go back to [facility] for evaluation in November. I recommend we continue on chemotherapy holiday. I will get her blood rechecked in 6 months. In the meantime, we will continue the Zometa every 6 months. She had no recent dental issues and is on calcium and vitamin D supplement

## 2015-01-04 ENCOUNTER — Telehealth: Payer: Self-pay | Admitting: Hematology and Oncology

## 2015-01-04 NOTE — Telephone Encounter (Signed)
returned call adn s.w. pt and r/s appt to later time....pt ok and aware

## 2015-01-11 ENCOUNTER — Ambulatory Visit (HOSPITAL_BASED_OUTPATIENT_CLINIC_OR_DEPARTMENT_OTHER): Payer: 59

## 2015-01-11 DIAGNOSIS — C9001 Multiple myeloma in remission: Secondary | ICD-10-CM | POA: Diagnosis not present

## 2015-01-11 DIAGNOSIS — Z452 Encounter for adjustment and management of vascular access device: Secondary | ICD-10-CM

## 2015-01-11 DIAGNOSIS — Z95828 Presence of other vascular implants and grafts: Secondary | ICD-10-CM

## 2015-01-11 MED ORDER — SODIUM CHLORIDE 0.9 % IJ SOLN
10.0000 mL | INTRAMUSCULAR | Status: DC | PRN
  Administered 2015-01-11: 10 mL via INTRAVENOUS
  Filled 2015-01-11: qty 10

## 2015-01-11 MED ORDER — HEPARIN SOD (PORK) LOCK FLUSH 100 UNIT/ML IV SOLN
500.0000 [IU] | Freq: Once | INTRAVENOUS | Status: AC
  Administered 2015-01-11: 500 [IU] via INTRAVENOUS
  Filled 2015-01-11: qty 5

## 2015-01-11 NOTE — Patient Instructions (Signed)

## 2015-01-26 ENCOUNTER — Other Ambulatory Visit: Payer: Self-pay | Admitting: Hematology and Oncology

## 2015-02-08 ENCOUNTER — Telehealth: Payer: Self-pay | Admitting: Hematology and Oncology

## 2015-02-08 ENCOUNTER — Other Ambulatory Visit: Payer: Self-pay | Admitting: Hematology and Oncology

## 2015-02-08 ENCOUNTER — Telehealth: Payer: Self-pay | Admitting: *Deleted

## 2015-02-08 DIAGNOSIS — C9001 Multiple myeloma in remission: Secondary | ICD-10-CM

## 2015-02-08 NOTE — Telephone Encounter (Signed)
per pof to sch pt lab prior to flush-pt aware

## 2015-02-08 NOTE — Telephone Encounter (Signed)
Placed order and POF

## 2015-02-08 NOTE — Telephone Encounter (Signed)
Pt went to [facility] this past Tuesday.  They were unable to get enough blood from her arms to do labs. She didn't want them to stick any more.  She has Flush appt here on 12/15 and asks if Dr. Alvy Bimler ok to order the "every 3 month labs" for 12/15 w/ Flush appt?  Nurse to fax results to Dr. Ok Edwards at [facility].

## 2015-02-22 ENCOUNTER — Ambulatory Visit (HOSPITAL_BASED_OUTPATIENT_CLINIC_OR_DEPARTMENT_OTHER): Payer: 59

## 2015-02-22 ENCOUNTER — Other Ambulatory Visit (HOSPITAL_BASED_OUTPATIENT_CLINIC_OR_DEPARTMENT_OTHER): Payer: 59

## 2015-02-22 VITALS — BP 130/76 | HR 94 | Temp 98.4°F

## 2015-02-22 DIAGNOSIS — C9001 Multiple myeloma in remission: Secondary | ICD-10-CM

## 2015-02-22 DIAGNOSIS — Z95828 Presence of other vascular implants and grafts: Secondary | ICD-10-CM

## 2015-02-22 DIAGNOSIS — Z452 Encounter for adjustment and management of vascular access device: Secondary | ICD-10-CM

## 2015-02-22 LAB — CBC WITH DIFFERENTIAL/PLATELET
BASO%: 0.4 % (ref 0.0–2.0)
Basophils Absolute: 0 10*3/uL (ref 0.0–0.1)
EOS%: 2.7 % (ref 0.0–7.0)
Eosinophils Absolute: 0.1 10*3/uL (ref 0.0–0.5)
HCT: 38.1 % (ref 34.8–46.6)
HGB: 12.8 g/dL (ref 11.6–15.9)
LYMPH#: 1.8 10*3/uL (ref 0.9–3.3)
LYMPH%: 40.1 % (ref 14.0–49.7)
MCH: 32.6 pg (ref 25.1–34.0)
MCHC: 33.6 g/dL (ref 31.5–36.0)
MCV: 96.9 fL (ref 79.5–101.0)
MONO#: 0.5 10*3/uL (ref 0.1–0.9)
MONO%: 12 % (ref 0.0–14.0)
NEUT%: 44.8 % (ref 38.4–76.8)
NEUTROS ABS: 2 10*3/uL (ref 1.5–6.5)
PLATELETS: 125 10*3/uL — AB (ref 145–400)
RBC: 3.93 10*6/uL (ref 3.70–5.45)
RDW: 12.3 % (ref 11.2–14.5)
WBC: 4.5 10*3/uL (ref 3.9–10.3)

## 2015-02-22 LAB — COMPREHENSIVE METABOLIC PANEL
ALT: 10 U/L (ref 0–55)
ANION GAP: 9 meq/L (ref 3–11)
AST: 21 U/L (ref 5–34)
Albumin: 4 g/dL (ref 3.5–5.0)
Alkaline Phosphatase: 48 U/L (ref 40–150)
BUN: 14.9 mg/dL (ref 7.0–26.0)
CHLORIDE: 104 meq/L (ref 98–109)
CO2: 26 meq/L (ref 22–29)
Calcium: 8.8 mg/dL (ref 8.4–10.4)
Creatinine: 0.8 mg/dL (ref 0.6–1.1)
EGFR: 79 mL/min/{1.73_m2} — ABNORMAL LOW (ref >90)
GLUCOSE: 92 mg/dL (ref 70–140)
Potassium: 3.6 mEq/L (ref 3.5–5.1)
Sodium: 140 mEq/L (ref 136–145)
TOTAL PROTEIN: 6.3 g/dL — AB (ref 6.4–8.3)
Total Bilirubin: 0.59 mg/dL (ref 0.20–1.20)

## 2015-02-22 MED ORDER — SODIUM CHLORIDE 0.9 % IJ SOLN
10.0000 mL | INTRAMUSCULAR | Status: DC | PRN
  Administered 2015-02-22: 10 mL via INTRAVENOUS
  Filled 2015-02-22: qty 10

## 2015-02-22 MED ORDER — HEPARIN SOD (PORK) LOCK FLUSH 100 UNIT/ML IV SOLN
500.0000 [IU] | Freq: Once | INTRAVENOUS | Status: AC
  Administered 2015-02-22: 500 [IU] via INTRAVENOUS
  Filled 2015-02-22: qty 5

## 2015-02-27 LAB — SPEP & IFE WITH QIG
ALPHA-1-GLOBULIN: 0.3 g/dL (ref 0.2–0.3)
ALPHA-2-GLOBULIN: 0.6 g/dL (ref 0.5–0.9)
Albumin ELP: 4 g/dL (ref 3.8–4.8)
BETA 2: 0.2 g/dL (ref 0.2–0.5)
BETA GLOBULIN: 0.3 g/dL — AB (ref 0.4–0.6)
Gamma Globulin: 0.5 g/dL — ABNORMAL LOW (ref 0.8–1.7)
IGG (IMMUNOGLOBIN G), SERUM: 577 mg/dL — AB (ref 690–1700)
IgA: 33 mg/dL — ABNORMAL LOW (ref 69–380)
IgM, Serum: 63 mg/dL (ref 52–322)
Total Protein, Serum Electrophoresis: 5.9 g/dL — ABNORMAL LOW (ref 6.1–8.1)

## 2015-02-27 LAB — KAPPA/LAMBDA LIGHT CHAINS
KAPPA FREE LGHT CHN: 13.5 mg/dL — AB (ref 0.33–1.94)
KAPPA LAMBDA RATIO: 33.75 — AB (ref 0.26–1.65)
LAMBDA FREE LGHT CHN: 0.4 mg/dL — AB (ref 0.57–2.63)

## 2015-03-02 ENCOUNTER — Other Ambulatory Visit: Payer: Self-pay | Admitting: Hematology and Oncology

## 2015-03-13 ENCOUNTER — Telehealth: Payer: Self-pay | Admitting: *Deleted

## 2015-03-13 NOTE — Telephone Encounter (Signed)
Husband left VM asking for return call to him by Dr. Alvy Bimler or Nurse to explain results of recent lab work.  He says he is concerned about elevated Kappa:Lambda Ratio.

## 2015-03-14 ENCOUNTER — Telehealth: Payer: Self-pay | Admitting: *Deleted

## 2015-03-14 ENCOUNTER — Ambulatory Visit (HOSPITAL_BASED_OUTPATIENT_CLINIC_OR_DEPARTMENT_OTHER): Payer: 59 | Admitting: Hematology and Oncology

## 2015-03-14 VITALS — BP 138/81 | HR 105 | Temp 98.2°F | Resp 18 | Ht 59.0 in | Wt 137.0 lb

## 2015-03-14 DIAGNOSIS — C9002 Multiple myeloma in relapse: Secondary | ICD-10-CM

## 2015-03-14 NOTE — Telephone Encounter (Signed)
LVM for pt's husband informing him Dr. Alvy Bimler can see pt today at either 1:00 pm,  1:30 pm or 2:30 pm.  Or tomorrow at 2 pm to discuss test results.  Please call back to let nurse know if pt can come today or tomorrow and what time.

## 2015-03-14 NOTE — Telephone Encounter (Signed)
The simple answer is her disease is probably relapsed  If they want to talk further I can see her this afternoon: 1 pm, 130, 230 or tomorrow at 2 pm

## 2015-03-14 NOTE — Telephone Encounter (Signed)
Husband called back and LVM they will come to see Dr. Alvy Bimler this afternoon at 2:30 pm.  Urgent POF sent.

## 2015-03-15 ENCOUNTER — Encounter: Payer: Self-pay | Admitting: Hematology and Oncology

## 2015-03-15 NOTE — Assessment & Plan Note (Signed)
I reviewed test results with her. The elevated light chains are consistent with disease relapse. She has no evidence of organ damage and is completely asymptomatic. I recommend observation and recheck blood work as scheduled in 2 months and I would add skeletal survey. We discussed potential treatment options in the future including repeat bone marrow transplant. I addressed all her questions and concerns.

## 2015-03-15 NOTE — Progress Notes (Signed)
[place] OFFICE PROGRESS NOTE  Patient Care Team: [name], MD as PCP - General [name], MD as Consulting Physician (Oncology) [name], MD as Consulting Physician (Neurosurgery) [name], MD as Consulting Physician (Hematology and Oncology)  SUMMARY OF ONCOLOGIC HISTORY:  She was diagnosed with kappa light chain multiple myeloma in May 2009 when she presented with back pain, anemia, hypercalcemia, and proteinuria. Briefly, she received induction therapy with RVD and achieved an excellent response. She went on to receive high-dose IV melphalan with autologous stem cell support in October 2009 at [facility]. She received monthly Zometa for the first 2 years and then changed to an every 6 month schedule.  She showed signs of early progression in June 2013 with rising kappa free light chains in the serum and appearance of monoclonal proteins in the urine. Bone marrow aspirate and biopsy done 09/25/2011 showed only 4% plasma cells. Negative cytogenetic studies. She was put her on treatment with a combination of Revlimid 25 mg 3 weeks on one-week off plus dexamethasone 40 mg by mouth weekly beginning in August 2013. She had a nice response with fall in her paraprotein levels back to normal and in January 2014, with modified schedule by deleting the steroids and dropping the Revlimid down to a single 10 mg daily dose. At her request, prior hematologist further decreased her Revlimid to 3 weeks on 1 week off.    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 She made an informed decision to stop Revlimid and go on observation only. She continues to receive Zometa every 6 months and she follows frequently at [place]: Please see below for problem oriented charting. She have chronic bone pain, stable. She went to [facility] last year but blood work was not repeated due to poor  venous access. She denies recent infection.  REVIEW OF SYSTEMS:   Constitutional: Denies fevers, chills or abnormal weight loss Eyes: Denies blurriness of vision Ears, nose, mouth, throat, and face: Denies mucositis or sore throat Respiratory: Denies cough, dyspnea or wheezes Cardiovascular: Denies palpitation, chest discomfort or lower extremity swelling Gastrointestinal:  Denies nausea, heartburn or change in bowel habits Skin: Denies abnormal skin rashes Lymphatics: Denies new lymphadenopathy or easy bruising Neurological:Denies numbness, tingling or new weaknesses Behavioral/Psych: Mood is stable, no new changes  All other systems were reviewed with the patient and are negative.  I have reviewed the past medical history, past surgical history, social history and family history with the patient and they are unchanged from previous note.  ALLERGIES:  is allergic to hydromorphone.  MEDICATIONS:  Current Outpatient Prescriptions  Medication Sig Dispense Refill  . aspirin 81 MG tablet Take 81 mg by mouth.    . cholecalciferol (VITAMIN D) 1000 UNITS tablet Take 1,000 Units by mouth daily.    . Multiple Vitamins-Minerals (ONE-A-DAY 50 PLUS PO) Take by mouth daily.    Marland Kitchen oxycodone (OXY-IR) 5 MG capsule Take 5 mg by mouth every 4 (four) hours as needed. pain    . Oxycodone HCl (OXYCONTIN) 60 MG TB12 Take 1 tablet by mouth 2 (two) times daily.    . pantoprazole (PROTONIX) 40 MG tablet TAKE (1) TABLET DAILY AS NEEDED. 30 tablet 0  . valACYclovir (VALTREX) 500 MG tablet TAKE 1 TABLET BY MOUTH DAILY. (Patient taking differently: daily as needed. TAKE 1 TABLET BY MOUTH DAILY.) 30 tablet 11   No current facility-administered medications for  this visit.    PHYSICAL EXAMINATION: ECOG PERFORMANCE STATUS: 0 - Asymptomatic  Filed Vitals:   03/14/15 1439  BP: 138/81  Pulse: 105  Temp: 98.2 F (36.8 C)  Resp: 18   Filed Weights   03/14/15 1439  Weight: 137 lb (62.143 kg)     GENERAL:alert, no distress and comfortable SKIN: skin color, texture, turgor are normal, no rashes or significant lesions EYES: normal, Conjunctiva are pink and non-injected, sclera clear Musculoskeletal:no cyanosis of digits and no clubbing  NEURO: alert & oriented x 3 with fluent speech, no focal motor/sensory deficits  LABORATORY DATA:  I have reviewed the data as listed    Component Value Date/Time   NA 140 02/22/2015 1124   NA 143 10/10/2011 1023   K 3.6 02/22/2015 1124   K 3.8 10/10/2011 1023   CL 107 08/30/2012 1055   CL 105 10/10/2011 1023   CO2 26 02/22/2015 1124   CO2 29 10/10/2011 1023   GLUCOSE 92 02/22/2015 1124   GLUCOSE 102* 08/30/2012 1055   GLUCOSE 74 10/10/2011 1023   BUN 14.9 02/22/2015 1124   BUN 16 10/10/2011 1023   CREATININE 0.8 02/22/2015 1124   CREATININE 0.83 10/10/2011 1023   CREATININE 0.75 07/17/2009 1541   CALCIUM 8.8 02/22/2015 1124   CALCIUM 9.3 10/10/2011 1023   PROT 6.3* 02/22/2015 1124   PROT 6.7 08/19/2011 1353   ALBUMIN 4.0 02/22/2015 1124   ALBUMIN 4.2 08/19/2011 1353   AST 21 02/22/2015 1124   AST 23 08/19/2011 1353   ALT 10 02/22/2015 1124   ALT 11 08/19/2011 1353   ALKPHOS 48 02/22/2015 1124   ALKPHOS 54 08/19/2011 1353   BILITOT 0.59 02/22/2015 1124   BILITOT 0.3 08/19/2011 1353    No results found for: SPEP, UPEP  Lab Results  Component Value Date   WBC 4.5 02/22/2015   NEUTROABS 2.0 02/22/2015   HGB 12.8 02/22/2015   HCT 38.1 02/22/2015   MCV 96.9 02/22/2015   PLT 125* 02/22/2015      Chemistry      Component Value Date/Time   NA 140 02/22/2015 1124   NA 143 10/10/2011 1023   K 3.6 02/22/2015 1124   K 3.8 10/10/2011 1023   CL 107 08/30/2012 1055   CL 105 10/10/2011 1023   CO2 26 02/22/2015 1124   CO2 29 10/10/2011 1023   BUN 14.9 02/22/2015 1124   BUN 16 10/10/2011 1023   CREATININE 0.8 02/22/2015 1124   CREATININE 0.83 10/10/2011 1023   CREATININE 0.75 07/17/2009 1541      Component Value  Date/Time   CALCIUM 8.8 02/22/2015 1124   CALCIUM 9.3 10/10/2011 1023   ALKPHOS 48 02/22/2015 1124   ALKPHOS 54 08/19/2011 1353   AST 21 02/22/2015 1124   AST 23 08/19/2011 1353   ALT 10 02/22/2015 1124   ALT 11 08/19/2011 1353   BILITOT 0.59 02/22/2015 1124   BILITOT 0.3 08/19/2011 1353      ASSESSMENT & PLAN:  Multiple myeloma in relapse I reviewed test results with her. The elevated light chains are consistent with disease relapse. She has no evidence of organ damage and is completely asymptomatic. I recommend observation and recheck blood work as scheduled in 2 months and I would add skeletal survey. We discussed potential treatment options in the future including repeat bone marrow transplant. I addressed all her questions and concerns.   Orders Placed This Encounter  Procedures  . DG Bone Survey Met    Standing Status:  Future     Number of Occurrences:      Standing Expiration Date: 05/14/2016    Order Specific Question:  Reason for Exam (SYMPTOM  OR DIAGNOSIS REQUIRED)    Answer:  myeloma staging    Order Specific Question:  Is the patient pregnant?    Answer:  No    Order Specific Question:  Preferred imaging location?    Answer:  [facility]   All questions were answered. The patient knows to call the clinic with any problems, questions or concerns. No barriers to learning was detected. I spent 15 minutes counseling the patient face to face. The total time spent in the appointment was 20 minutes and more than 50% was on counseling and review of test results     [facility], [place], MD 03/15/2015 4:31 PM

## 2015-04-06 ENCOUNTER — Other Ambulatory Visit (HOSPITAL_COMMUNITY): Payer: Self-pay | Admitting: Anesthesiology

## 2015-04-06 ENCOUNTER — Ambulatory Visit (HOSPITAL_COMMUNITY)
Admission: RE | Admit: 2015-04-06 | Discharge: 2015-04-06 | Disposition: A | Discharge status: Home / Self Care | Payer: 59 | Source: Ambulatory Visit | Attending: Anesthesiology | Admitting: Anesthesiology

## 2015-04-06 DIAGNOSIS — Z8572 Personal history of non-Hodgkin lymphomas: Secondary | ICD-10-CM | POA: Insufficient documentation

## 2015-04-06 DIAGNOSIS — M4854XD Collapsed vertebra, not elsewhere classified, thoracic region, subsequent encounter for fracture with routine healing: Secondary | ICD-10-CM | POA: Diagnosis not present

## 2015-04-06 DIAGNOSIS — M4856XD Collapsed vertebra, not elsewhere classified, lumbar region, subsequent encounter for fracture with routine healing: Secondary | ICD-10-CM | POA: Diagnosis not present

## 2015-04-06 DIAGNOSIS — M47814 Spondylosis without myelopathy or radiculopathy, thoracic region: Secondary | ICD-10-CM | POA: Diagnosis not present

## 2015-04-06 DIAGNOSIS — Z8579 Personal history of other malignant neoplasms of lymphoid, hematopoietic and related tissues: Secondary | ICD-10-CM

## 2015-04-06 DIAGNOSIS — M549 Dorsalgia, unspecified: Secondary | ICD-10-CM | POA: Insufficient documentation

## 2015-04-06 DIAGNOSIS — M8580 Other specified disorders of bone density and structure, unspecified site: Secondary | ICD-10-CM | POA: Diagnosis not present

## 2015-04-11 ENCOUNTER — Ambulatory Visit (HOSPITAL_BASED_OUTPATIENT_CLINIC_OR_DEPARTMENT_OTHER): Payer: 59

## 2015-04-11 VITALS — BP 130/80 | HR 99 | Temp 97.5°F

## 2015-04-11 DIAGNOSIS — Z95828 Presence of other vascular implants and grafts: Secondary | ICD-10-CM

## 2015-04-11 DIAGNOSIS — Z452 Encounter for adjustment and management of vascular access device: Secondary | ICD-10-CM

## 2015-04-11 DIAGNOSIS — C9002 Multiple myeloma in relapse: Secondary | ICD-10-CM

## 2015-04-11 MED ORDER — SODIUM CHLORIDE 0.9% FLUSH
10.0000 mL | INTRAVENOUS | Status: DC | PRN
  Administered 2015-04-11: 10 mL via INTRAVENOUS
  Filled 2015-04-11: qty 10

## 2015-04-11 MED ORDER — HEPARIN SOD (PORK) LOCK FLUSH 100 UNIT/ML IV SOLN
500.0000 [IU] | Freq: Once | INTRAVENOUS | Status: AC
  Administered 2015-04-11: 500 [IU] via INTRAVENOUS
  Filled 2015-04-11: qty 5

## 2015-05-23 ENCOUNTER — Other Ambulatory Visit: Payer: Self-pay | Admitting: Hematology and Oncology

## 2015-05-24 ENCOUNTER — Other Ambulatory Visit (HOSPITAL_BASED_OUTPATIENT_CLINIC_OR_DEPARTMENT_OTHER): Payer: 59

## 2015-05-24 ENCOUNTER — Ambulatory Visit (HOSPITAL_BASED_OUTPATIENT_CLINIC_OR_DEPARTMENT_OTHER): Payer: 59

## 2015-05-24 ENCOUNTER — Ambulatory Visit: Payer: 59

## 2015-05-24 VITALS — BP 143/88 | HR 110 | Temp 97.8°F | Resp 20

## 2015-05-24 DIAGNOSIS — C9001 Multiple myeloma in remission: Secondary | ICD-10-CM

## 2015-05-24 DIAGNOSIS — C9002 Multiple myeloma in relapse: Secondary | ICD-10-CM | POA: Diagnosis not present

## 2015-05-24 DIAGNOSIS — Z95828 Presence of other vascular implants and grafts: Secondary | ICD-10-CM

## 2015-05-24 LAB — CBC WITH DIFFERENTIAL/PLATELET
BASO%: 0.6 % (ref 0.0–2.0)
Basophils Absolute: 0 10*3/uL (ref 0.0–0.1)
EOS%: 2.8 % (ref 0.0–7.0)
Eosinophils Absolute: 0.1 10*3/uL (ref 0.0–0.5)
HEMATOCRIT: 39.4 % (ref 34.8–46.6)
HEMOGLOBIN: 13.4 g/dL (ref 11.6–15.9)
LYMPH#: 1.2 10*3/uL (ref 0.9–3.3)
LYMPH%: 26.8 % (ref 14.0–49.7)
MCH: 32.7 pg (ref 25.1–34.0)
MCHC: 34 g/dL (ref 31.5–36.0)
MCV: 96.1 fL (ref 79.5–101.0)
MONO#: 0.3 10*3/uL (ref 0.1–0.9)
MONO%: 7.4 % (ref 0.0–14.0)
NEUT%: 62.4 % (ref 38.4–76.8)
NEUTROS ABS: 2.9 10*3/uL (ref 1.5–6.5)
Platelets: 136 10*3/uL — ABNORMAL LOW (ref 145–400)
RBC: 4.1 10*6/uL (ref 3.70–5.45)
RDW: 12.2 % (ref 11.2–14.5)
WBC: 4.6 10*3/uL (ref 3.9–10.3)

## 2015-05-24 LAB — COMPREHENSIVE METABOLIC PANEL
ALBUMIN: 4.2 g/dL (ref 3.5–5.0)
ALK PHOS: 53 U/L (ref 40–150)
ALT: 12 U/L (ref 0–55)
AST: 22 U/L (ref 5–34)
Anion Gap: 8 mEq/L (ref 3–11)
BILIRUBIN TOTAL: 0.56 mg/dL (ref 0.20–1.20)
BUN: 10 mg/dL (ref 7.0–26.0)
CALCIUM: 9.1 mg/dL (ref 8.4–10.4)
CO2: 28 mEq/L (ref 22–29)
Chloride: 104 mEq/L (ref 98–109)
Creatinine: 0.8 mg/dL (ref 0.6–1.1)
EGFR: 80 mL/min/{1.73_m2} — AB (ref >90)
GLUCOSE: 103 mg/dL (ref 70–140)
POTASSIUM: 3.9 meq/L (ref 3.5–5.1)
SODIUM: 140 meq/L (ref 136–145)
TOTAL PROTEIN: 6.7 g/dL (ref 6.4–8.3)

## 2015-05-24 MED ORDER — SODIUM CHLORIDE 0.9% FLUSH
10.0000 mL | INTRAVENOUS | Status: DC | PRN
  Filled 2015-05-24: qty 10

## 2015-05-24 MED ORDER — HEPARIN SOD (PORK) LOCK FLUSH 100 UNIT/ML IV SOLN
250.0000 [IU] | Freq: Once | INTRAVENOUS | Status: AC | PRN
  Administered 2015-05-24: 500 [IU]
  Filled 2015-05-24: qty 5

## 2015-05-24 MED ORDER — SODIUM CHLORIDE 0.9 % IJ SOLN
3.0000 mL | Freq: Once | INTRAMUSCULAR | Status: AC | PRN
  Administered 2015-05-24: 10 mL via INTRAVENOUS
  Filled 2015-05-24: qty 10

## 2015-05-24 MED ORDER — ZOLEDRONIC ACID 4 MG/100ML IV SOLN
4.0000 mg | Freq: Once | INTRAVENOUS | Status: AC
  Administered 2015-05-24: 4 mg via INTRAVENOUS
  Filled 2015-05-24: qty 100

## 2015-05-24 NOTE — Patient Instructions (Signed)

## 2015-05-24 NOTE — Patient Instructions (Signed)

## 2015-05-24 NOTE — Progress Notes (Signed)
Labs to be drawn in chemo room  Per patient request. Called and informed Leonard Downing, Therapist, sports. Lab tubes sent with patient.

## 2015-05-25 LAB — KAPPA/LAMBDA LIGHT CHAINS
Ig Kappa Free Light Chain: 432.28 mg/L — ABNORMAL HIGH (ref 3.30–19.40)
Ig Lambda Free Light Chain: 5.72 mg/L (ref 5.71–26.30)
Kappa/Lambda FluidC Ratio: 75.57 — ABNORMAL HIGH (ref 0.26–1.65)

## 2015-05-30 ENCOUNTER — Other Ambulatory Visit: Payer: Self-pay | Admitting: Hematology and Oncology

## 2015-05-30 LAB — MULTIPLE MYELOMA PANEL, SERUM
ALBUMIN SERPL ELPH-MCNC: 4 g/dL (ref 2.9–4.4)
ALPHA2 GLOB SERPL ELPH-MCNC: 0.7 g/dL (ref 0.4–1.0)
Albumin/Glob SerPl: 1.7 (ref 0.7–1.7)
Alpha 1: 0.2 g/dL (ref 0.0–0.4)
B-GLOBULIN SERPL ELPH-MCNC: 0.9 g/dL (ref 0.7–1.3)
Gamma Glob SerPl Elph-Mcnc: 0.5 g/dL (ref 0.4–1.8)
Globulin, Total: 2.4 g/dL (ref 2.2–3.9)
IGG (IMMUNOGLOBIN G), SERUM: 500 mg/dL — AB (ref 700–1600)
IGM (IMMUNOGLOBIN M), SRM: 39 mg/dL (ref 26–217)
IgA, Qn, Serum: 27 mg/dL — ABNORMAL LOW (ref 87–352)
TOTAL PROTEIN: 6.4 g/dL (ref 6.0–8.5)

## 2015-05-31 ENCOUNTER — Ambulatory Visit: Payer: 59

## 2015-05-31 ENCOUNTER — Telehealth: Payer: Self-pay | Admitting: Hematology and Oncology

## 2015-05-31 ENCOUNTER — Ambulatory Visit (HOSPITAL_BASED_OUTPATIENT_CLINIC_OR_DEPARTMENT_OTHER): Payer: 59 | Admitting: Hematology and Oncology

## 2015-05-31 ENCOUNTER — Encounter: Payer: Self-pay | Admitting: Hematology and Oncology

## 2015-05-31 VITALS — BP 130/89 | HR 107 | Temp 98.1°F | Resp 18 | Ht 59.0 in | Wt 137.8 lb

## 2015-05-31 DIAGNOSIS — C9002 Multiple myeloma in relapse: Secondary | ICD-10-CM | POA: Diagnosis not present

## 2015-05-31 DIAGNOSIS — M549 Dorsalgia, unspecified: Secondary | ICD-10-CM | POA: Diagnosis not present

## 2015-05-31 DIAGNOSIS — G8929 Other chronic pain: Secondary | ICD-10-CM | POA: Insufficient documentation

## 2015-05-31 NOTE — Patient Instructions (Signed)
Ixazomib oral capsules What is this medicine? IXAZOMIB (ix az oh mib) is a medicine that targets proteins in cancer cells and stops the cancer cells from growing. It is used to treat multiple myeloma. This medicine may be used for other purposes; ask your health care provider or pharmacist if you have questions. What should I tell my health care provider before I take this medicine? They need to know if you have any of these conditions: -kidney disease -liver disease -an unusual or allergic reaction to ixazomib, or other medicines, foods, dyes, or preservatives -pregnant or trying to get pregnant -breast-feeding How should I use this medicine? Take this medicine by mouth with a glass of water. Follow the directions on the prescription label. Take this medicine on an empty stomach, at least 1 hour before or 2 hours after food. Do not take with food. Do not take at the same time as dexamethasone. Do not cut, crush or chew this medicine. Take your medicine at regular intervals. Do not take it more often than directed. Do not stop taking except on your doctor's advice. Talk to your pediatrician regarding the use of this medicine in children. Special care may be needed. Overdosage: If you think you have taken too much of this medicine contact a poison control center or emergency room at once. NOTE: This medicine is only for you. Do not share this medicine with others. What if I miss a dose? If you miss a dose, take it as soon as you can. If your next dose is to be taken in less than 72 hours, then do not take the missed dose. Take the next dose at your regular time. If you vomit after taking your medicine, do not take another dose. Take the next dose at your regular time. Do not take double or extra doses. What may interact with this medicine? This medicine may interact with the following medications: -carbamazepine -phenytoin -rifampin -St. John's Wort This list may not describe all possible  interactions. Give your health care provider a list of all the medicines, herbs, non-prescription drugs, or dietary supplements you use. Also tell them if you smoke, drink alcohol, or use illegal drugs. Some items may interact with your medicine. What should I watch for while using this medicine? Your condition will be monitored carefully while you are receiving this medicine. Report any side effects. Continue your course of treatment even though you feel ill unless your doctor tells you to stop. This medicine may increase your risk to bruise or bleed. Call your doctor or health care professional if you notice any unusual bleeding. You may need blood work done while you are taking this medicine. Do not become pregnant while taking this medicine or for 90 days after stopping it. Women should inform their doctor if they wish to become pregnant or think they might be pregnant. Men should not father a child while taking this medicine and for 90 days after stopping it. There is a potential for serious side effects to an unborn child. Talk to your health care professional or pharmacist for more information. Do not breast-feed an infant while taking this medicine. What side effects may I notice from receiving this medicine? Side effects that you should report to your doctor or health care professional as soon as possible: -allergic reactions like skin rash, itching or hives, swelling of the face, lips, or tongue -constipation -diarrhea -nausea/vomiting -pain, tingling, numbness in the hands or feet -signs and symptoms of liver injury like dark  yellow or brown urine; general ill feeling or flu-like symptoms; light-colored stools; loss of appetite; nausea; right upper belly pain; unusually weak or tired; yellowing of the eyes or skin -swelling of the ankles, feet, hands -unusual bleeding or bruising Side effects that usually do not require medical attention (Report these to your doctor or health care  professional if they continue or are bothersome.): -back pain -blurred vision This list may not describe all possible side effects. Call your doctor for medical advice about side effects. You may report side effects to FDA at 1-800-FDA-1088. Where should I keep my medicine? Keep out of the reach of children. Store in original packaging at room temperature between 15 and 30 degrees C (59 and 86 degrees F). Throw away any unused medicine after the expiration date. NOTE: This sheet is a summary. It may not cover all possible information. If you have questions about this medicine, talk to your doctor, pharmacist, or health care provider.    2016, Elsevier/Gold Standard. (2014-04-25 14:54:53)

## 2015-05-31 NOTE — Assessment & Plan Note (Signed)
She has severe, acute on chronic back pain which she thought could be related to recent medical changes. However, I'm concerned about disease relapse. I recommend PET CT scan for evaluation. I recommend she continues to work with her other physician for pain management. I will see her back next week to discuss treatment option

## 2015-05-31 NOTE — Assessment & Plan Note (Signed)
I am concerned about her progressive black pain and significant elevated light chain. I felt that her worsening back pain is related to disease relapse and not due to recent medical changes. I recommend consideration to restart treatment as soon as possible. I will order a PET CT scan for staging. She is interested to return back to [facility] for further evaluation and I encouraged her to call the office tomorrow to set up an appointment.

## 2015-05-31 NOTE — Progress Notes (Signed)
[facility] OFFICE PROGRESS NOTE  Patient Care Team: [name], MD as PCP - General [name], MD as Consulting Physician (Oncology) [name], MD as Consulting Physician (Neurosurgery) [name], MD as Consulting Physician (Hematology and Oncology)  SUMMARY OF ONCOLOGIC HISTORY:  She was diagnosed with kappa light chain multiple myeloma in May 2009 when she presented with back pain, anemia, hypercalcemia, and proteinuria. Briefly, she received induction therapy with RVD and achieved an excellent response. She went on to receive high-dose IV melphalan with autologous stem cell support in October 2009 at [facility]. She received monthly Zometa for the first 2 years and then changed to an every 6 month schedule.  She showed signs of early progression in June 2013 with rising kappa free light chains in the serum and appearance of monoclonal proteins in the urine. Bone marrow aspirate and biopsy done 09/25/2011 showed only 4% plasma cells. Negative cytogenetic studies. She was put her on treatment with a combination of Revlimid 25 mg 3 weeks on one-week off plus dexamethasone 40 mg by mouth weekly beginning in August 2013. She had a nice response with fall in her paraprotein levels back to normal and in January 2014, with modified schedule by deleting the steroids and dropping the Revlimid down to a single 10 mg daily dose. At her request, prior hematologist further decreased her Revlimid to 3 weeks on 1 week off.    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 She made an informed decision to stop Revlimid and go on observation only. She continues to receive Zometa every 6 months and she follows frequently at [facility]: Please see below for problem oriented charting. She had recent acute on chronic back pain that she thought could be related to changes in her pain management. She  denies recent infection. Denies recent weight loss. She does not want to go back to infusional type treatment if possible  REVIEW OF SYSTEMS:   Constitutional: Denies fevers, chills or abnormal weight loss Eyes: Denies blurriness of vision Ears, nose, mouth, throat, and face: Denies mucositis or sore throat Respiratory: Denies cough, dyspnea or wheezes Cardiovascular: Denies palpitation, chest discomfort or lower extremity swelling Gastrointestinal:  Denies nausea, heartburn or change in bowel habits Skin: Denies abnormal skin rashes Lymphatics: Denies new lymphadenopathy or easy bruising Neurological:Denies numbness, tingling or new weaknesses Behavioral/Psych: Mood is stable, no new changes  All other systems were reviewed with the patient and are negative.  I have reviewed the past medical history, past surgical history, social history and family history with the patient and they are unchanged from previous note.  ALLERGIES:  is allergic to hydromorphone.  MEDICATIONS:  Current Outpatient Prescriptions  Medication Sig Dispense Refill  . aspirin 81 MG tablet Take 81 mg by mouth.    . cholecalciferol (VITAMIN D) 1000 UNITS tablet Take 1,000 Units by mouth daily.    . Multiple Vitamins-Minerals (ONE-A-DAY 50 PLUS PO) Take by mouth daily.    Marland Kitchen oxycodone (OXY-IR) 5 MG capsule Take 5 mg by mouth every 4 (four) hours as needed. pain    . Oxycodone HCl (OXYCONTIN) 60 MG TB12 Take 1 tablet by mouth 2 (two) times daily.    . pantoprazole (PROTONIX) 40 MG tablet TAKE (1) TABLET DAILY AS NEEDED. 30 tablet 0  . valACYclovir (VALTREX) 500 MG tablet TAKE 1 TABLET BY MOUTH DAILY. (Patient taking differently: daily as needed. TAKE 1 TABLET BY  MOUTH DAILY.) 30 tablet 11   No current facility-administered medications for this visit.    PHYSICAL EXAMINATION: ECOG PERFORMANCE STATUS: 1 - Symptomatic but completely ambulatory  Filed Vitals:   05/31/15 1132  BP: 130/89  Pulse: 107  Temp: 98.1  F (36.7 C)  Resp: 18   Filed Weights   05/31/15 1132  Weight: 137 lb 12.8 oz (62.506 kg)    GENERAL:alert, no distress and comfortable SKIN: skin color, texture, turgor are normal, no rashes or significant lesions EYES: normal, Conjunctiva are pink and non-injected, sclera clear Musculoskeletal:no cyanosis of digits and no clubbing  NEURO: alert & oriented x 3 with fluent speech, no focal motor/sensory deficits  LABORATORY DATA:  I have reviewed the data as listed    Component Value Date/Time   NA 140 05/24/2015 1059   NA 143 10/10/2011 1023   K 3.9 05/24/2015 1059   K 3.8 10/10/2011 1023   CL 107 08/30/2012 1055   CL 105 10/10/2011 1023   CO2 28 05/24/2015 1059   CO2 29 10/10/2011 1023   GLUCOSE 103 05/24/2015 1059   GLUCOSE 102* 08/30/2012 1055   GLUCOSE 74 10/10/2011 1023   BUN 10.0 05/24/2015 1059   BUN 16 10/10/2011 1023   CREATININE 0.8 05/24/2015 1059   CREATININE 0.83 10/10/2011 1023   CREATININE 0.75 07/17/2009 1541   CALCIUM 9.1 05/24/2015 1059   CALCIUM 9.3 10/10/2011 1023   PROT 6.4 05/24/2015 1059   PROT 6.7 05/24/2015 1059   PROT 6.7 08/19/2011 1353   ALBUMIN 4.2 05/24/2015 1059   ALBUMIN 4.2 08/19/2011 1353   AST 22 05/24/2015 1059   AST 23 08/19/2011 1353   ALT 12 05/24/2015 1059   ALT 11 08/19/2011 1353   ALKPHOS 53 05/24/2015 1059   ALKPHOS 54 08/19/2011 1353   BILITOT 0.56 05/24/2015 1059   BILITOT 0.3 08/19/2011 1353    No results found for: SPEP, UPEP  Lab Results  Component Value Date   WBC 4.6 05/24/2015   NEUTROABS 2.9 05/24/2015   HGB 13.4 05/24/2015   HCT 39.4 05/24/2015   MCV 96.1 05/24/2015   PLT 136* 05/24/2015      Chemistry      Component Value Date/Time   NA 140 05/24/2015 1059   NA 143 10/10/2011 1023   K 3.9 05/24/2015 1059   K 3.8 10/10/2011 1023   CL 107 08/30/2012 1055   CL 105 10/10/2011 1023   CO2 28 05/24/2015 1059   CO2 29 10/10/2011 1023   BUN 10.0 05/24/2015 1059   BUN 16 10/10/2011 1023    CREATININE 0.8 05/24/2015 1059   CREATININE 0.83 10/10/2011 1023   CREATININE 0.75 07/17/2009 1541      Component Value Date/Time   CALCIUM 9.1 05/24/2015 1059   CALCIUM 9.3 10/10/2011 1023   ALKPHOS 53 05/24/2015 1059   ALKPHOS 54 08/19/2011 1353   AST 22 05/24/2015 1059   AST 23 08/19/2011 1353   ALT 12 05/24/2015 1059   ALT 11 08/19/2011 1353   BILITOT 0.56 05/24/2015 1059   BILITOT 0.3 08/19/2011 1353      ASSESSMENT & PLAN:  Multiple myeloma in relapse ([place]) I am concerned about her progressive black pain and significant elevated light chain. I felt that her worsening back pain is related to disease relapse and not due to recent medical changes. I recommend consideration to restart treatment as soon as possible. I will order a PET CT scan for staging. She is interested to return back to [facility] for further evaluation and I encouraged her to call the office tomorrow to set up an appointment.  Chronic back pain greater than 3 months duration She has severe, acute on chronic back pain which she thought could be related to recent medical changes. However, I'm concerned about disease relapse. I recommend PET CT scan for evaluation. I recommend she continues to work with her other physician for pain management. I will see her back next week to discuss treatment option   Orders Placed This Encounter  Procedures  . NM PET Image Restage (PS) Whole Body    Standing Status: Future     Number of Occurrences:      Standing Expiration Date: 07/30/2016    Order Specific Question:  Reason for Exam (SYMPTOM  OR DIAGNOSIS REQUIRED)    Answer:  staging myeloma, severe back pain, prior compression fractures    Order Specific Question:  Is the patient pregnant?    Answer:  No    Order Specific Question:  Preferred imaging location?    Answer:  [facility]   All questions were answered. The patient knows to call the clinic with any problems, questions or concerns. No  barriers to learning was detected. I spent 25 minutes counseling the patient face to face. The total time spent in the appointment was 30 minutes and more than 50% was on counseling and review of test results     [facility], [place], MD 05/31/2015 4:27 PM

## 2015-05-31 NOTE — Telephone Encounter (Signed)
Gave and printed appt sched and avs for pt for March. °

## 2015-06-01 ENCOUNTER — Telehealth: Payer: Self-pay | Admitting: *Deleted

## 2015-06-01 ENCOUNTER — Encounter: Payer: Self-pay | Admitting: *Deleted

## 2015-06-01 NOTE — Progress Notes (Signed)
Faxed Dr. Calton Dach office note from yesterday and labwork from last week to Dr. Ok Edwards at [facility] fax [phone].

## 2015-06-01 NOTE — Telephone Encounter (Signed)
Pt called concerned about the risk of Radiation exposure w/ getting PET scan done.  Discussed w/ pt the Radiation exposure is minimal, and is less than the risk of having undiagnosed/ untreated progression of Multiple Myeloma.   Explained PET will help Dr. Alvy Bimler assess stage of her cancer and help determine need for and guide treatment plan.   She should not feel any side effects of radiation exposure.  Pt verbalized understanding and states feels better about getting PET done now.  It is scheduled for next week 3/28.

## 2015-06-05 ENCOUNTER — Ambulatory Visit (HOSPITAL_COMMUNITY)
Admission: RE | Admit: 2015-06-05 | Discharge: 2015-06-05 | Disposition: A | Discharge status: Home / Self Care | Payer: 59 | Source: Ambulatory Visit | Attending: Hematology and Oncology | Admitting: Hematology and Oncology

## 2015-06-05 DIAGNOSIS — C9002 Multiple myeloma in relapse: Secondary | ICD-10-CM | POA: Diagnosis present

## 2015-06-05 DIAGNOSIS — M545 Low back pain: Secondary | ICD-10-CM | POA: Diagnosis present

## 2015-06-05 DIAGNOSIS — M899 Disorder of bone, unspecified: Secondary | ICD-10-CM | POA: Insufficient documentation

## 2015-06-05 DIAGNOSIS — M4855XS Collapsed vertebra, not elsewhere classified, thoracolumbar region, sequela of fracture: Secondary | ICD-10-CM | POA: Diagnosis not present

## 2015-06-05 DIAGNOSIS — G8929 Other chronic pain: Secondary | ICD-10-CM | POA: Insufficient documentation

## 2015-06-05 LAB — GLUCOSE, CAPILLARY: Glucose-Capillary: 81 mg/dL (ref 65–99)

## 2015-06-05 MED ORDER — FLUDEOXYGLUCOSE F - 18 (FDG) INJECTION
6.7400 | Freq: Once | INTRAVENOUS | Status: AC | PRN
  Administered 2015-06-05: 6.74 via INTRAVENOUS

## 2015-06-07 ENCOUNTER — Telehealth: Payer: Self-pay | Admitting: *Deleted

## 2015-06-07 NOTE — Telephone Encounter (Signed)
Faxed labs and office note to [facility]- Dr Octaviano Batty office

## 2015-06-08 ENCOUNTER — Telehealth: Payer: Self-pay | Admitting: Hematology and Oncology

## 2015-06-08 ENCOUNTER — Ambulatory Visit (HOSPITAL_BASED_OUTPATIENT_CLINIC_OR_DEPARTMENT_OTHER): Payer: 59 | Admitting: Hematology and Oncology

## 2015-06-08 ENCOUNTER — Other Ambulatory Visit: Payer: Self-pay | Admitting: Hematology and Oncology

## 2015-06-08 ENCOUNTER — Encounter: Payer: Self-pay | Admitting: Hematology and Oncology

## 2015-06-08 ENCOUNTER — Telehealth: Payer: Self-pay | Admitting: *Deleted

## 2015-06-08 ENCOUNTER — Other Ambulatory Visit: Payer: Self-pay | Admitting: *Deleted

## 2015-06-08 VITALS — BP 120/77 | HR 100 | Temp 98.2°F | Resp 18 | Ht 59.0 in | Wt 136.4 lb

## 2015-06-08 DIAGNOSIS — B372 Candidiasis of skin and nail: Secondary | ICD-10-CM

## 2015-06-08 DIAGNOSIS — G8929 Other chronic pain: Secondary | ICD-10-CM

## 2015-06-08 DIAGNOSIS — C9002 Multiple myeloma in relapse: Secondary | ICD-10-CM | POA: Diagnosis not present

## 2015-06-08 DIAGNOSIS — M549 Dorsalgia, unspecified: Secondary | ICD-10-CM

## 2015-06-08 DIAGNOSIS — B379 Candidiasis, unspecified: Secondary | ICD-10-CM

## 2015-06-08 HISTORY — DX: Candidiasis, unspecified: B37.9

## 2015-06-08 MED ORDER — DEXAMETHASONE 4 MG PO TABS: 20.0000 mg | ORAL_TABLET | ORAL | Status: DC

## 2015-06-08 MED ORDER — NYSTATIN 100000 UNIT/GM EX OINT: 1.0000 "application " | TOPICAL_OINTMENT | Freq: Two times a day (BID) | CUTANEOUS | Status: DC

## 2015-06-08 MED ORDER — DEXAMETHASONE 4 MG PO TABS: 40.0000 mg | ORAL_TABLET | ORAL | Status: DC

## 2015-06-08 MED ORDER — LENALIDOMIDE 15 MG PO CAPS: ORAL_CAPSULE | ORAL | Status: DC

## 2015-06-08 NOTE — Telephone Encounter (Signed)
Gave and printed appt sched and avs for pt for may °

## 2015-06-08 NOTE — Telephone Encounter (Signed)
My fault 20 mg, 5 tabs a week is the correct dose

## 2015-06-08 NOTE — Telephone Encounter (Signed)
Husband wants to clarify Dexamethasone dose.  Bottle says take 10 tablets (40 mg) once a week but he thinks Dr. Alvy Bimler told pt to take 5 tablets (20 mg) once a week?

## 2015-06-08 NOTE — Telephone Encounter (Signed)
Informed husband correct dose is 5 tablets (20 mg) of Dexamethasone once a week (not 40 mg).   He verbalized understanding.

## 2015-06-08 NOTE — Progress Notes (Signed)
[facility] OFFICE PROGRESS NOTE  Patient Care Team: Jessica Stains, MD as PCP - General Jessica Pilgrim, MD as Consulting Physician (Oncology) Jessica Cha, MD as Consulting Physician (Neurosurgery) Jessica Lark, MD as Consulting Physician (Hematology and Oncology)  SUMMARY OF ONCOLOGIC HISTORY:  She was diagnosed with kappa light chain multiple myeloma in May 2009 when she presented with back pain, anemia, hypercalcemia, and proteinuria. Briefly, she received induction therapy with RVD and achieved an excellent response. She went on to receive high-dose IV melphalan with autologous stem cell support in October 2009 at [facility]. She received monthly Zometa for the first 2 years and then changed to an every 6 month schedule.  She showed signs of early progression in June 2013 with rising kappa free light chains in the serum and appearance of monoclonal proteins in the urine. Bone marrow aspirate and biopsy done 09/25/2011 showed only 4% plasma cells. Negative cytogenetic studies. She was put her on treatment with a combination of Revlimid 25 mg 3 weeks on one-week off plus dexamethasone 40 mg by mouth weekly beginning in August 2013. She had a nice response with fall in her paraprotein levels back to normal and in January 2014, with modified schedule by deleting the steroids and dropping the Revlimid down to a single 10 mg daily dose. At her request, prior hematologist further decreased her Revlimid to 3 weeks on 1 week off.    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 She made an informed decision to stop Revlimid and go on observation only. She continues to receive Zometa every 6 months and she follows frequently at [facility] In March 2017, she is noted to have progressive elevated light chain. PET CT scan showed no evidence of imminent bone fracture. Decision is made to resume treatment with Revlimid and  dexamethasone.  INTERVAL HISTORY: Please see below for problem oriented charting. She returns for further follow-up. Chronic back pain is about the same. Her only new complaint is new yeast infection under her left breast started recently. She denies rash elsewhere. No recent fevers, chills, or changes in appetite.  REVIEW OF SYSTEMS:   Constitutional: Denies fevers, chills or abnormal weight loss Eyes: Denies blurriness of vision Ears, nose, mouth, throat, and face: Denies mucositis or sore throat Respiratory: Denies cough, dyspnea or wheezes Cardiovascular: Denies palpitation, chest discomfort or lower extremity swelling Gastrointestinal:  Denies nausea, heartburn or change in bowel habits Lymphatics: Denies new lymphadenopathy or easy bruising Neurological:Denies numbness, tingling or new weaknesses Behavioral/Psych: Mood is stable, no new changes  All other systems were reviewed with the patient and are negative.  I have reviewed the past medical history, past surgical history, social history and family history with the patient and they are unchanged from previous note.  ALLERGIES:  is allergic to hydromorphone.  MEDICATIONS:  Current Outpatient Prescriptions  Medication Sig Dispense Refill  . aspirin 81 MG tablet Take 81 mg by mouth.    . cholecalciferol (VITAMIN D) 1000 UNITS tablet Take 1,000 Units by mouth daily.    Marland Kitchen dexamethasone (DECADRON) 4 MG tablet Take 10 tablets (40 mg total) by mouth once a week. 90 tablet 0  . lenalidomide (REVLIMID) 15 MG capsule Take 1 capsule 21 days on, 7 days off 21 capsule 0  . Multiple Vitamins-Minerals (ONE-A-DAY 50 PLUS PO) Take by mouth daily.    Marland Kitchen nystatin ointment (MYCOSTATIN) Apply 1 application topically 2 (two) times daily. 30 g  0  . oxycodone (OXY-IR) 5 MG capsule Take 5 mg by mouth every 4 (four) hours as needed. pain    . Oxycodone HCl (OXYCONTIN) 60 MG TB12 Take 1 tablet by mouth 2 (two) times daily.    . pantoprazole  (PROTONIX) 40 MG tablet TAKE (1) TABLET DAILY AS NEEDED. 30 tablet 0  . valACYclovir (VALTREX) 500 MG tablet TAKE 1 TABLET BY MOUTH DAILY. (Patient taking differently: daily as needed. TAKE 1 TABLET BY MOUTH DAILY.) 30 tablet 11   No current facility-administered medications for this visit.    PHYSICAL EXAMINATION: ECOG PERFORMANCE STATUS: 1 - Symptomatic but completely ambulatory  Filed Vitals:   06/08/15 1314  BP: 120/77  Pulse: 100  Temp: 98.2 F (36.8 C)  Resp: 18   Filed Weights   06/08/15 1314  Weight: 136 lb 6.4 oz (61.871 kg)    GENERAL:alert, no distress and comfortable SKIN: Noted is infection under her breast EYES: normal, Conjunctiva are pink and non-injected, sclera clear Musculoskeletal:no cyanosis of digits and no clubbing  NEURO: alert & oriented x 3 with fluent speech, no focal motor/sensory deficits  LABORATORY DATA:  I have reviewed the data as listed    Component Value Date/Time   NA 140 05/24/2015 1059   NA 143 10/10/2011 1023   K 3.9 05/24/2015 1059   K 3.8 10/10/2011 1023   CL 107 08/30/2012 1055   CL 105 10/10/2011 1023   CO2 28 05/24/2015 1059   CO2 29 10/10/2011 1023   GLUCOSE 103 05/24/2015 1059   GLUCOSE 102* 08/30/2012 1055   GLUCOSE 74 10/10/2011 1023   BUN 10.0 05/24/2015 1059   BUN 16 10/10/2011 1023   CREATININE 0.8 05/24/2015 1059   CREATININE 0.83 10/10/2011 1023   CREATININE 0.75 07/17/2009 1541   CALCIUM 9.1 05/24/2015 1059   CALCIUM 9.3 10/10/2011 1023   PROT 6.4 05/24/2015 1059   PROT 6.7 05/24/2015 1059   PROT 6.7 08/19/2011 1353   ALBUMIN 4.2 05/24/2015 1059   ALBUMIN 4.2 08/19/2011 1353   AST 22 05/24/2015 1059   AST 23 08/19/2011 1353   ALT 12 05/24/2015 1059   ALT 11 08/19/2011 1353   ALKPHOS 53 05/24/2015 1059   ALKPHOS 54 08/19/2011 1353   BILITOT 0.56 05/24/2015 1059   BILITOT 0.3 08/19/2011 1353    No results found for: SPEP, UPEP  Lab Results  Component Value Date   WBC 4.6 05/24/2015   NEUTROABS  2.9 05/24/2015   HGB 13.4 05/24/2015   HCT 39.4 05/24/2015   MCV 96.1 05/24/2015   PLT 136* 05/24/2015      Chemistry      Component Value Date/Time   NA 140 05/24/2015 1059   NA 143 10/10/2011 1023   K 3.9 05/24/2015 1059   K 3.8 10/10/2011 1023   CL 107 08/30/2012 1055   CL 105 10/10/2011 1023   CO2 28 05/24/2015 1059   CO2 29 10/10/2011 1023   BUN 10.0 05/24/2015 1059   BUN 16 10/10/2011 1023   CREATININE 0.8 05/24/2015 1059   CREATININE 0.83 10/10/2011 1023   CREATININE 0.75 07/17/2009 1541      Component Value Date/Time   CALCIUM 9.1 05/24/2015 1059   CALCIUM 9.3 10/10/2011 1023   ALKPHOS 53 05/24/2015 1059   ALKPHOS 54 08/19/2011 1353   AST 22 05/24/2015 1059   AST 23 08/19/2011 1353   ALT 12 05/24/2015 1059   ALT 11 08/19/2011 1353   BILITOT 0.56 05/24/2015 1059   BILITOT 0.3  08/19/2011 1353       RADIOGRAPHIC STUDIES:I reviewed the PET CT scan with the patient and her husband I have personally reviewed the radiological images as listed and agreed with the findings in the report.    ASSESSMENT & PLAN:  Multiple myeloma in relapse [facility]) I discussed the case with her hematologist at [facility]. I reviewed the PET CT scan with her which did not show any discrete lesions and no imminent signs of bone fractures. We discussed treatment options. Ultimately, we agreed to proceed with weekly dexamethasone at 20 mg by mouth along with Revlimid at moderate dose, 15 mg daily for 21 days, off 7 days. We discussed the risks, benefits, side effects of treatment including risk of thrombosis, pancytopenia, risks of infection etc. and she agreed to proceed. She will continue aspirin for DVT prophylaxis along with her calcium and vitamin D. I suspect she will get her treatment delivered next week and I plan to see her back within the month for toxicity reviewed. She will continue Zometa every 3 months.  Chronic back pain Her back pain is about the same. PET/CT scan  showed no evidence of new lytic lesions of bone fractures. She will continue close follow-up at the pain clinic. She will continue calcium and vitamin D. I suspect, if there is a component of disease causing chronic back pain, weekly dexamethasone will help with the pain tremendously. I will reassess her pain control next month. If her chronic pack pain is persistent despite 1 months of treatment, we will consider MRI of her back for further evaluation. She agreed with the plan of care.  Yeast infection She has evidence of yeast infection under the skin fold beneath her left breast. This is related to her immunocompromised state. I recommend topical nystatin cream for 1 week prior to start of treatment.   Orders Placed This Encounter  Procedures  . CBC with Differential/Platelet    Standing Status: Future     Number of Occurrences:      Standing Expiration Date: 07/12/2016  . Comprehensive metabolic panel    Standing Status: Future     Number of Occurrences:      Standing Expiration Date: 07/12/2016  . Kappa/lambda light chains    Standing Status: Future     Number of Occurrences:      Standing Expiration Date: 07/12/2016   All questions were answered. The patient knows to call the clinic with any problems, questions or concerns. No barriers to learning was detected. I spent 30 minutes counseling the patient face to face. The total time spent in the appointment was 40 minutes and more than 50% was on counseling and review of test results     [facility], [place], MD 06/08/2015 1:36 PM

## 2015-06-08 NOTE — Telephone Encounter (Signed)
Reviewed Revlimid/ [employer] information w/ pt and husband.  Pt understands risk of Birth Defects.  Pt "reactivated" with [employer] REMs program.  Josem Kaufmann [id number].  [facility] states uses [facility] per her insurance.  I [facility]ed Rx to [facility] along w/ copy [facility].

## 2015-06-08 NOTE — Assessment & Plan Note (Signed)
I discussed the case with her hematologist at [facility]. I reviewed the PET CT scan with her which did not show any discrete lesions and no imminent signs of bone fractures. We discussed treatment options. Ultimately, we agreed to proceed with weekly dexamethasone at 20 mg by mouth along with Revlimid at moderate dose, 15 mg daily for 21 days, off 7 days. We discussed the risks, benefits, side effects of treatment including risk of thrombosis, pancytopenia, risks of infection etc. and she agreed to proceed. She will continue aspirin for DVT prophylaxis along with her calcium and vitamin D. I suspect she will get her treatment delivered next week and I plan to see her back within the month for toxicity reviewed. She will continue Zometa every 3 months.

## 2015-06-08 NOTE — Assessment & Plan Note (Signed)
Her back pain is about the same. PET/CT scan showed no evidence of new lytic lesions of bone fractures. She will continue close follow-up at the pain clinic. She will continue calcium and vitamin D. I suspect, if there is a component of disease causing chronic back pain, weekly dexamethasone will help with the pain tremendously. I will reassess her pain control next month. If her chronic pack pain is persistent despite 1 months of treatment, we will consider MRI of her back for further evaluation. She agreed with the plan of care.

## 2015-06-08 NOTE — Assessment & Plan Note (Signed)
She has evidence of yeast infection under the skin fold beneath her left breast. This is related to her immunocompromised state. I recommend topical nystatin cream for 1 week prior to start of treatment.

## 2015-06-11 ENCOUNTER — Encounter (HOSPITAL_COMMUNITY): Payer: 59

## 2015-06-14 ENCOUNTER — Encounter: Payer: Self-pay | Admitting: Hematology and Oncology

## 2015-06-14 NOTE — Progress Notes (Signed)
[facility] revlimid approved 06/12/15-06/11/16 pa# [id number] PN-sent to medical records

## 2015-07-02 ENCOUNTER — Other Ambulatory Visit: Payer: Self-pay | Admitting: Hematology and Oncology

## 2015-07-11 ENCOUNTER — Telehealth: Payer: Self-pay | Admitting: *Deleted

## 2015-07-11 NOTE — Telephone Encounter (Signed)
-----   Message from [name], MD sent at 07/11/2015  2:51 PM EDT ----- Regarding: come early Can you call to see if she can come early tomorrow to see me at 70? If not, leave it as is Thanks

## 2015-07-11 NOTE — Telephone Encounter (Signed)
Spoke with husband, [name] to see if patient can come in at 1000 for lab/flush and then see Dr Alvy Bimler at 1030.  Will call us back

## 2015-07-12 ENCOUNTER — Other Ambulatory Visit: Payer: Self-pay | Admitting: *Deleted

## 2015-07-12 ENCOUNTER — Ambulatory Visit (HOSPITAL_BASED_OUTPATIENT_CLINIC_OR_DEPARTMENT_OTHER): Payer: 59

## 2015-07-12 ENCOUNTER — Other Ambulatory Visit (HOSPITAL_BASED_OUTPATIENT_CLINIC_OR_DEPARTMENT_OTHER): Payer: 59

## 2015-07-12 ENCOUNTER — Encounter: Payer: Self-pay | Admitting: Hematology and Oncology

## 2015-07-12 ENCOUNTER — Telehealth: Payer: Self-pay | Admitting: Hematology and Oncology

## 2015-07-12 ENCOUNTER — Ambulatory Visit (HOSPITAL_BASED_OUTPATIENT_CLINIC_OR_DEPARTMENT_OTHER): Payer: 59 | Admitting: Hematology and Oncology

## 2015-07-12 VITALS — BP 124/72 | HR 71 | Temp 98.2°F | Resp 18 | Wt 137.0 lb

## 2015-07-12 DIAGNOSIS — C9002 Multiple myeloma in relapse: Secondary | ICD-10-CM

## 2015-07-12 DIAGNOSIS — Z95828 Presence of other vascular implants and grafts: Secondary | ICD-10-CM | POA: Insufficient documentation

## 2015-07-12 DIAGNOSIS — T451X5A Adverse effect of antineoplastic and immunosuppressive drugs, initial encounter: Secondary | ICD-10-CM

## 2015-07-12 DIAGNOSIS — D61810 Antineoplastic chemotherapy induced pancytopenia: Secondary | ICD-10-CM

## 2015-07-12 DIAGNOSIS — Z452 Encounter for adjustment and management of vascular access device: Secondary | ICD-10-CM

## 2015-07-12 DIAGNOSIS — M549 Dorsalgia, unspecified: Secondary | ICD-10-CM | POA: Diagnosis not present

## 2015-07-12 DIAGNOSIS — C9001 Multiple myeloma in remission: Secondary | ICD-10-CM

## 2015-07-12 DIAGNOSIS — G8929 Other chronic pain: Secondary | ICD-10-CM | POA: Diagnosis not present

## 2015-07-12 LAB — CBC WITH DIFFERENTIAL/PLATELET
BASO%: 1.1 % (ref 0.0–2.0)
Basophils Absolute: 0 10*3/uL (ref 0.0–0.1)
EOS%: 6.7 % (ref 0.0–7.0)
Eosinophils Absolute: 0.3 10*3/uL (ref 0.0–0.5)
HEMATOCRIT: 35 % (ref 34.8–46.6)
HEMOGLOBIN: 11.5 g/dL — AB (ref 11.6–15.9)
LYMPH#: 1.4 10*3/uL (ref 0.9–3.3)
LYMPH%: 31.8 % (ref 14.0–49.7)
MCH: 31.5 pg (ref 25.1–34.0)
MCHC: 32.9 g/dL (ref 31.5–36.0)
MCV: 95.8 fL (ref 79.5–101.0)
MONO#: 0.7 10*3/uL (ref 0.1–0.9)
MONO%: 15.9 % — ABNORMAL HIGH (ref 0.0–14.0)
NEUT%: 44.5 % (ref 38.4–76.8)
NEUTROS ABS: 2 10*3/uL (ref 1.5–6.5)
Platelets: 120 10*3/uL — ABNORMAL LOW (ref 145–400)
RBC: 3.65 10*6/uL — ABNORMAL LOW (ref 3.70–5.45)
RDW: 12.5 % (ref 11.2–14.5)
WBC: 4.4 10*3/uL (ref 3.9–10.3)

## 2015-07-12 LAB — COMPREHENSIVE METABOLIC PANEL
ALBUMIN: 3.8 g/dL (ref 3.5–5.0)
ALT: 16 U/L (ref 0–55)
AST: 21 U/L (ref 5–34)
Alkaline Phosphatase: 43 U/L (ref 40–150)
Anion Gap: 7 mEq/L (ref 3–11)
BILIRUBIN TOTAL: 0.51 mg/dL (ref 0.20–1.20)
BUN: 12.2 mg/dL (ref 7.0–26.0)
CHLORIDE: 107 meq/L (ref 98–109)
CO2: 27 mEq/L (ref 22–29)
CREATININE: 0.8 mg/dL (ref 0.6–1.1)
Calcium: 8.7 mg/dL (ref 8.4–10.4)
EGFR: 87 mL/min/{1.73_m2} — ABNORMAL LOW (ref >90)
Glucose: 83 mg/dl (ref 70–140)
Potassium: 3.3 mEq/L — ABNORMAL LOW (ref 3.5–5.1)
Sodium: 141 mEq/L (ref 136–145)
TOTAL PROTEIN: 5.9 g/dL — AB (ref 6.4–8.3)

## 2015-07-12 MED ORDER — HEPARIN SOD (PORK) LOCK FLUSH 100 UNIT/ML IV SOLN
250.0000 [IU] | Freq: Once | INTRAVENOUS | Status: DC | PRN
  Filled 2015-07-12: qty 5

## 2015-07-12 MED ORDER — LENALIDOMIDE 15 MG PO CAPS: 15.0000 mg | ORAL_CAPSULE | Freq: Every day | ORAL | Status: DC

## 2015-07-12 MED ORDER — SODIUM CHLORIDE 0.9 % IJ SOLN
3.0000 mL | Freq: Once | INTRAMUSCULAR | Status: DC | PRN
  Filled 2015-07-12: qty 10

## 2015-07-12 MED ORDER — SODIUM CHLORIDE 0.9 % IJ SOLN
10.0000 mL | INTRAMUSCULAR | Status: DC | PRN
  Administered 2015-07-12: 10 mL via INTRAVENOUS
  Filled 2015-07-12: qty 10

## 2015-07-12 MED ORDER — HEPARIN SOD (PORK) LOCK FLUSH 100 UNIT/ML IV SOLN
500.0000 [IU] | Freq: Once | INTRAVENOUS | Status: AC | PRN
  Administered 2015-07-12: 500 [IU] via INTRAVENOUS
  Filled 2015-07-12: qty 5

## 2015-07-12 NOTE — Progress Notes (Signed)
[place] OFFICE PROGRESS NOTE  Patient Care Team: [name], MD as PCP - General [name], MD as Consulting Physician (Oncology) [name], MD as Consulting Physician (Neurosurgery) [name], MD as Consulting Physician (Hematology and Oncology)  SUMMARY OF ONCOLOGIC HISTORY:   Multiple myeloma in relapse [facility])   06/18/2015 -  Chemotherapy She resumed taking Revlimid and dexamethasone    She was diagnosed with kappa light chain multiple myeloma in May 2009 when she presented with back pain, anemia, hypercalcemia, and proteinuria. Briefly, she received induction therapy with RVD and achieved an excellent response. She went on to receive high-dose IV melphalan with autologous stem cell support in October 2009 at [facility]. She received monthly Zometa for the first 2 years and then changed to an every 6 month schedule.  She showed signs of early progression in June 2013 with rising kappa free light chains in the serum and appearance of monoclonal proteins in the urine. Bone marrow aspirate and biopsy done 09/25/2011 showed only 4% plasma cells. Negative cytogenetic studies. She was put her on treatment with a combination of Revlimid 25 mg 3 weeks on one-week off plus dexamethasone 40 mg by mouth weekly beginning in August 2013. She had a nice response with fall in her paraprotein levels back to normal and in January 2014, with modified schedule by deleting the steroids and dropping the Revlimid down to a single 10 mg daily dose. At her request, prior hematologist further decreased her Revlimid to 3 weeks on 1 week off.    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 She made an informed decision to stop Revlimid and go on observation only. She continues to receive Zometa every 6 months and she follows frequently at [facility] In March 2017, she is noted to have progressive elevated light chain.  PET CT scan showed no evidence of imminent bone fracture. Decision is made to resume treatment with Revlimid and dexamethasone.  INTERVAL HISTORY: Please see below for problem oriented charting. She had great relief and better pain control since she started on treatment. Her only side effects is mild fatigue. Denies recent infection. The patient denies any recent signs or symptoms of bleeding such as spontaneous epistaxis, hematuria or hematochezia.  REVIEW OF SYSTEMS:   Constitutional: Denies fevers, chills or abnormal weight loss Eyes: Denies blurriness of vision Ears, nose, mouth, throat, and face: Denies mucositis or sore throat Respiratory: Denies cough, dyspnea or wheezes Cardiovascular: Denies palpitation, chest discomfort or lower extremity swelling Gastrointestinal:  Denies nausea, heartburn or change in bowel habits Skin: Denies abnormal skin rashes Lymphatics: Denies new lymphadenopathy or easy bruising Neurological:Denies numbness, tingling or new weaknesses Behavioral/Psych: Mood is stable, no new changes  All other systems were reviewed with the patient and are negative.  I have reviewed the past medical history, past surgical history, social history and family history with the patient and they are unchanged from previous note.  ALLERGIES:  is allergic to hydromorphone.  MEDICATIONS:  Current Outpatient Prescriptions  Medication Sig Dispense Refill  . aspirin 81 MG tablet Take 81 mg by mouth.    . cholecalciferol (VITAMIN D) 1000 UNITS tablet Take 1,000 Units by mouth daily.    Marland Kitchen dexamethasone (DECADRON) 4 MG tablet Take 5 tablets (20 mg total) by mouth once a week. 90 tablet 0  . Multiple Vitamins-Minerals (ONE-A-DAY 50 PLUS PO) Take by mouth daily.    Marland Kitchen oxycodone (OXY-IR) 5 MG  capsule Take 5 mg by mouth every 4 (four) hours as needed. pain    . Oxycodone HCl (OXYCONTIN) 60 MG TB12 Take 1 tablet by mouth 2 (two) times daily.    . pantoprazole (PROTONIX) 40 MG tablet  TAKE (1) TABLET DAILY AS NEEDED. 30 tablet 0  . REVLIMID 15 MG capsule TAKE 1 CAPSULE BY MOUTH DAILY FOR 21 DAYS ON, THEN 7 DAYS OFF. 21 capsule 0   No current facility-administered medications for this visit.    PHYSICAL EXAMINATION: ECOG PERFORMANCE STATUS: 1 - Symptomatic but completely ambulatory  Filed Vitals:   07/12/15 1211  BP: 124/72  Pulse: 71  Temp: 98.2 F (36.8 C)  Resp: 18   Filed Weights   07/12/15 1211  Weight: 137 lb (62.143 kg)    GENERAL:alert, no distress and comfortable SKIN: skin color, texture, turgor are normal, no rashes or significant lesions EYES: normal, Conjunctiva are pink and non-injected, sclera clear Musculoskeletal:no cyanosis of digits and no clubbing  NEURO: alert & oriented x 3 with fluent speech, no focal motor/sensory deficits  LABORATORY DATA:  I have reviewed the data as listed    Component Value Date/Time   NA 141 07/12/2015 1142   NA 143 10/10/2011 1023   K 3.3* 07/12/2015 1142   K 3.8 10/10/2011 1023   CL 107 08/30/2012 1055   CL 105 10/10/2011 1023   CO2 27 07/12/2015 1142   CO2 29 10/10/2011 1023   GLUCOSE 83 07/12/2015 1142   GLUCOSE 102* 08/30/2012 1055   GLUCOSE 74 10/10/2011 1023   BUN 12.2 07/12/2015 1142   BUN 16 10/10/2011 1023   CREATININE 0.8 07/12/2015 1142   CREATININE 0.83 10/10/2011 1023   CREATININE 0.75 07/17/2009 1541   CALCIUM 8.7 07/12/2015 1142   CALCIUM 9.3 10/10/2011 1023   PROT 5.9* 07/12/2015 1142   PROT 6.4 05/24/2015 1059   PROT 6.7 08/19/2011 1353   ALBUMIN 3.8 07/12/2015 1142   ALBUMIN 4.2 08/19/2011 1353   AST 21 07/12/2015 1142   AST 23 08/19/2011 1353   ALT 16 07/12/2015 1142   ALT 11 08/19/2011 1353   ALKPHOS 43 07/12/2015 1142   ALKPHOS 54 08/19/2011 1353   BILITOT 0.51 07/12/2015 1142   BILITOT 0.3 08/19/2011 1353    No results found for: SPEP, UPEP  Lab Results  Component Value Date   WBC 4.4 07/12/2015   NEUTROABS 2.0 07/12/2015   HGB 11.5* 07/12/2015   HCT 35.0  07/12/2015   MCV 95.8 07/12/2015   PLT 120* 07/12/2015      Chemistry      Component Value Date/Time   NA 141 07/12/2015 1142   NA 143 10/10/2011 1023   K 3.3* 07/12/2015 1142   K 3.8 10/10/2011 1023   CL 107 08/30/2012 1055   CL 105 10/10/2011 1023   CO2 27 07/12/2015 1142   CO2 29 10/10/2011 1023   BUN 12.2 07/12/2015 1142   BUN 16 10/10/2011 1023   CREATININE 0.8 07/12/2015 1142   CREATININE 0.83 10/10/2011 1023   CREATININE 0.75 07/17/2009 1541      Component Value Date/Time   CALCIUM 8.7 07/12/2015 1142   CALCIUM 9.3 10/10/2011 1023   ALKPHOS 43 07/12/2015 1142   ALKPHOS 54 08/19/2011 1353   AST 21 07/12/2015 1142   AST 23 08/19/2011 1353   ALT 16 07/12/2015 1142   ALT 11 08/19/2011 1353   BILITOT 0.51 07/12/2015 1142   BILITOT 0.3 08/19/2011 1353      ASSESSMENT &  PLAN:  Multiple myeloma in relapse ([place]) She tolerated treatment very well so far with improvement of back pain. The only side effects is mild fatigue. I recommend she continue the same dose with weekly dexamethasone, Revlimid along with intermittent doses of IV Zometa. I reinforced the importance of vitamin D, calcium supplement, and aspirin therapy. I plan to see her back in 2 months to repeat blood work prior to her return appointment.  Pancytopenia due to antineoplastic chemotherapy [facility]) This is likely treatment effects. The patient denies recent history of bleeding such as epistaxis, hematuria or hematochezia. She is asymptomatic from the anemia apart from mild fatigue. We will observe for now.   Chronic back pain Her back pain is drastically improved She will continue close follow-up at the pain clinic. She will continue calcium and vitamin D. I will start to initiate dexamethasone taper when I see her back in 2 months.   Orders Placed This Encounter  Procedures  . CBC with Differential/Platelet    Standing Status: Future     Number of Occurrences:      Standing Expiration Date:  08/15/2016  . Comprehensive metabolic panel    Standing Status: Future     Number of Occurrences:      Standing Expiration Date: 08/15/2016  . Kappa/lambda light chains    Standing Status: Future     Number of Occurrences:      Standing Expiration Date: 08/15/2016  . Multiple Myeloma Panel (SPEP&IFE w/QIG)    Standing Status: Future     Number of Occurrences:      Standing Expiration Date: 08/15/2016   All questions were answered. The patient knows to call the clinic with any problems, questions or concerns. No barriers to learning was detected. I spent 15 minutes counseling the patient face to face. The total time spent in the appointment was 20 minutes and more than 50% was on counseling and review of test results     [facility], [place], MD 07/12/2015 12:57 PM

## 2015-07-12 NOTE — Assessment & Plan Note (Signed)
She tolerated treatment very well so far with improvement of back pain. The only side effects is mild fatigue. I recommend she continue the same dose with weekly dexamethasone, Revlimid along with intermittent doses of IV Zometa. I reinforced the importance of vitamin D, calcium supplement, and aspirin therapy. I plan to see her back in 2 months to repeat blood work prior to her return appointment.

## 2015-07-12 NOTE — Patient Instructions (Signed)

## 2015-07-12 NOTE — Telephone Encounter (Signed)
Gave pt appt & avs °

## 2015-07-12 NOTE — Assessment & Plan Note (Signed)
Her back pain is drastically improved She will continue close follow-up at the pain clinic. She will continue calcium and vitamin D. I will start to initiate dexamethasone taper when I see her back in 2 months.

## 2015-07-12 NOTE — Assessment & Plan Note (Signed)
This is likely treatment effects. The patient denies recent history of bleeding such as epistaxis, hematuria or hematochezia. She is asymptomatic from the anemia apart from mild fatigue. We will observe for now.

## 2015-07-13 ENCOUNTER — Telehealth: Payer: Self-pay | Admitting: *Deleted

## 2015-07-13 LAB — KAPPA/LAMBDA LIGHT CHAINS
Ig Kappa Free Light Chain: 349.23 mg/L — ABNORMAL HIGH (ref 3.30–19.40)
Ig Lambda Free Light Chain: 8.91 mg/L (ref 5.71–26.30)
Kappa/Lambda FluidC Ratio: 39.2 — ABNORMAL HIGH (ref 0.26–1.65)

## 2015-07-13 NOTE — Telephone Encounter (Signed)
-----   Message from [name], MD sent at 07/13/2015  1:06 PM EDT ----- Regarding: light chain results Pls call her and let her know results are better I did not do the whole myeloma panel since her disease is more related to light chain Continue Rx as discussed ----- Message -----    From: [facility] Interface    Sent: 07/12/2015  12:11 PM      To: [name], MD

## 2015-07-13 NOTE — Telephone Encounter (Signed)
LM with note below 

## 2015-07-23 ENCOUNTER — Other Ambulatory Visit: Payer: Self-pay | Admitting: Hematology and Oncology

## 2015-08-02 ENCOUNTER — Other Ambulatory Visit: Payer: Self-pay | Admitting: *Deleted

## 2015-08-02 MED ORDER — REVLIMID 15 MG PO CAPS
15.0000 mg | ORAL_CAPSULE | Freq: Every day | ORAL | Status: DC
Start: 2015-08-02 — End: 2015-08-16

## 2015-08-02 NOTE — Telephone Encounter (Signed)
[facility]  They need [employer] Auth number for recent Revlimid refill.  Rx was sent w/o Auth number.   [employer] Auth number obtained and [facility]

## 2015-08-10 ENCOUNTER — Other Ambulatory Visit: Payer: Self-pay | Admitting: Hematology and Oncology

## 2015-08-16 ENCOUNTER — Other Ambulatory Visit: Payer: Self-pay | Admitting: Hematology and Oncology

## 2015-08-30 ENCOUNTER — Other Ambulatory Visit: Payer: Self-pay | Admitting: *Deleted

## 2015-08-30 MED ORDER — LENALIDOMIDE 15 MG PO CAPS: 15.0000 mg | ORAL_CAPSULE | Freq: Every day | ORAL | Status: DC

## 2015-09-06 ENCOUNTER — Other Ambulatory Visit (HOSPITAL_BASED_OUTPATIENT_CLINIC_OR_DEPARTMENT_OTHER): Payer: 59

## 2015-09-06 ENCOUNTER — Ambulatory Visit (HOSPITAL_BASED_OUTPATIENT_CLINIC_OR_DEPARTMENT_OTHER): Payer: 59

## 2015-09-06 DIAGNOSIS — C9002 Multiple myeloma in relapse: Secondary | ICD-10-CM | POA: Diagnosis not present

## 2015-09-06 DIAGNOSIS — Z452 Encounter for adjustment and management of vascular access device: Secondary | ICD-10-CM | POA: Diagnosis not present

## 2015-09-06 DIAGNOSIS — Z95828 Presence of other vascular implants and grafts: Secondary | ICD-10-CM

## 2015-09-06 LAB — COMPREHENSIVE METABOLIC PANEL
ALT: 50 U/L (ref 0–55)
ANION GAP: 9 meq/L (ref 3–11)
AST: 28 U/L (ref 5–34)
Albumin: 3.7 g/dL (ref 3.5–5.0)
Alkaline Phosphatase: 55 U/L (ref 40–150)
BUN: 12.5 mg/dL (ref 7.0–26.0)
CHLORIDE: 105 meq/L (ref 98–109)
CO2: 28 meq/L (ref 22–29)
Calcium: 8.8 mg/dL (ref 8.4–10.4)
Creatinine: 0.8 mg/dL (ref 0.6–1.1)
EGFR: 83 mL/min/{1.73_m2} — AB (ref >90)
Glucose: 85 mg/dl (ref 70–140)
Potassium: 3.5 mEq/L (ref 3.5–5.1)
Sodium: 142 mEq/L (ref 136–145)
Total Bilirubin: 0.44 mg/dL (ref 0.20–1.20)
Total Protein: 5.9 g/dL — ABNORMAL LOW (ref 6.4–8.3)

## 2015-09-06 LAB — CBC WITH DIFFERENTIAL/PLATELET
BASO%: 1.7 % (ref 0.0–2.0)
Basophils Absolute: 0.1 10*3/uL (ref 0.0–0.1)
EOS%: 10.5 % — ABNORMAL HIGH (ref 0.0–7.0)
Eosinophils Absolute: 0.4 10*3/uL (ref 0.0–0.5)
HCT: 36.1 % (ref 34.8–46.6)
HGB: 12 g/dL (ref 11.6–15.9)
LYMPH%: 38.4 % (ref 14.0–49.7)
MCH: 31.8 pg (ref 25.1–34.0)
MCHC: 33.2 g/dL (ref 31.5–36.0)
MCV: 95.8 fL (ref 79.5–101.0)
MONO#: 0.8 10*3/uL (ref 0.1–0.9)
MONO%: 20 % — AB (ref 0.0–14.0)
NEUT#: 1.2 10*3/uL — ABNORMAL LOW (ref 1.5–6.5)
NEUT%: 29.4 % — AB (ref 38.4–76.8)
PLATELETS: 84 10*3/uL — AB (ref 145–400)
RBC: 3.77 10*6/uL (ref 3.70–5.45)
RDW: 13.6 % (ref 11.2–14.5)
WBC: 4.1 10*3/uL (ref 3.9–10.3)
lymph#: 1.6 10*3/uL (ref 0.9–3.3)

## 2015-09-06 MED ORDER — HEPARIN SOD (PORK) LOCK FLUSH 100 UNIT/ML IV SOLN
500.0000 [IU] | Freq: Once | INTRAVENOUS | Status: AC | PRN
  Administered 2015-09-06: 500 [IU] via INTRAVENOUS
  Filled 2015-09-06: qty 5

## 2015-09-06 MED ORDER — SODIUM CHLORIDE 0.9 % IJ SOLN
10.0000 mL | INTRAMUSCULAR | Status: DC | PRN
  Administered 2015-09-06: 10 mL via INTRAVENOUS
  Filled 2015-09-06: qty 10

## 2015-09-06 NOTE — Patient Instructions (Signed)

## 2015-09-07 LAB — KAPPA/LAMBDA LIGHT CHAINS
IG KAPPA FREE LIGHT CHAIN: 339.7 mg/L — AB (ref 3.3–19.4)
IG LAMBDA FREE LIGHT CHAIN: 7.5 mg/L (ref 5.7–26.3)
KAPPA/LAMBDA FLC RATIO: 45.29 — AB (ref 0.26–1.65)

## 2015-09-10 LAB — MULTIPLE MYELOMA PANEL, SERUM
ALBUMIN SERPL ELPH-MCNC: 3.6 g/dL (ref 2.9–4.4)
ALPHA 1: 0.3 g/dL (ref 0.0–0.4)
ALPHA2 GLOB SERPL ELPH-MCNC: 0.6 g/dL (ref 0.4–1.0)
Albumin/Glob SerPl: 1.9 — ABNORMAL HIGH (ref 0.7–1.7)
B-GLOBULIN SERPL ELPH-MCNC: 0.8 g/dL (ref 0.7–1.3)
GAMMA GLOB SERPL ELPH-MCNC: 0.3 g/dL — AB (ref 0.4–1.8)
GLOBULIN, TOTAL: 2 g/dL — AB (ref 2.2–3.9)
IGG (IMMUNOGLOBIN G), SERUM: 359 mg/dL — AB (ref 700–1600)
IgA, Qn, Serum: 13 mg/dL — ABNORMAL LOW (ref 87–352)
IgM, Qn, Serum: 22 mg/dL — ABNORMAL LOW (ref 26–217)
Total Protein: 5.6 g/dL — ABNORMAL LOW (ref 6.0–8.5)

## 2015-09-13 ENCOUNTER — Ambulatory Visit (HOSPITAL_BASED_OUTPATIENT_CLINIC_OR_DEPARTMENT_OTHER): Payer: 59 | Admitting: Hematology and Oncology

## 2015-09-13 ENCOUNTER — Encounter: Payer: Self-pay | Admitting: Hematology and Oncology

## 2015-09-13 ENCOUNTER — Other Ambulatory Visit: Payer: Self-pay | Admitting: Hematology and Oncology

## 2015-09-13 VITALS — BP 113/89 | HR 100 | Temp 98.4°F | Resp 18 | Wt 128.0 lb

## 2015-09-13 DIAGNOSIS — D61810 Antineoplastic chemotherapy induced pancytopenia: Secondary | ICD-10-CM | POA: Diagnosis not present

## 2015-09-13 DIAGNOSIS — T451X5A Adverse effect of antineoplastic and immunosuppressive drugs, initial encounter: Secondary | ICD-10-CM

## 2015-09-13 DIAGNOSIS — C9002 Multiple myeloma in relapse: Secondary | ICD-10-CM | POA: Diagnosis not present

## 2015-09-13 NOTE — Assessment & Plan Note (Signed)
The patient has no symptoms from side effects of Revlimid. I am disappointed to see lack of progress in the last 2 months. The serum light chains had reduced but less than 50% since we started on treatment. With less than robust response, I recommend either addition of the second agent such as Daratumumab or Velcade or to switch treatment to combination therapy of Daratumumab, dexamethasone with either Velcade or Pomalidomide. I review with her the current NCCN guidelines. I gave her multiple literature handouts and discussed with the some of the expected risk and side effects of treatment. Ultimately, she cannot make a decision today. She would like to go home and think about it. I recommend she hold her Revlimid treatment now to allow blood count recovery The patient will call me within a week after she made a decision

## 2015-09-13 NOTE — Assessment & Plan Note (Signed)
This is likely treatment effects. The patient denies recent history of bleeding such as epistaxis, hematuria or hematochezia. She is asymptomatic from the neutropenia or thrombocytopenia. We will observe for now.  I recommend she hold her treatment.

## 2015-09-13 NOTE — Progress Notes (Signed)
[place] OFFICE PROGRESS NOTE  Patient Care Team: [name], MD as PCP - General [name], MD as Consulting Physician (Oncology) [name], MD as Consulting Physician (Neurosurgery) [name], MD as Consulting Physician (Hematology and Oncology)  SUMMARY OF ONCOLOGIC HISTORY:   Multiple myeloma in relapse [facility])   07/21/2007 Bone Marrow Biopsy Case #: [id number] Bone marrow showed myeloma   07/21/2007 Miscellaneous She was diagnosed in May 2009. Had several cycles of RVD   10/14/2007 Bone Marrow Biopsy Case #: [id number] repeat bone marrow biopsy showed good response to Rx   12/31/2007 Bone Marrow Transplant She had autologous BMT at [facility]   07/19/2009 Bone Marrow Biopsy Case #: [id number] Bone marrow biopsy showed only 1% plasma cells   10/10/2010 Bone Marrow Biopsy [id number] Bone marrow biopsy showed 2% plasma cells   09/25/2011 Bone Marrow Biopsy Accession #: [id number] Bone marrow only showed 4% plasma cells with ligh chain excess   10/14/2011 - 06/13/2013 Chemotherapy She received Revlimid only, discontinued due to pancytopenia   06/18/2015 -  Chemotherapy She resumed taking Revlimid and dexamethasone    INTERVAL HISTORY: Please see below for problem oriented charting. She returns today to review test results. She is asymptomatic with mild pancytopenia. She denies worsening back pain.  REVIEW OF SYSTEMS:   Constitutional: Denies fevers, chills or abnormal weight loss Eyes: Denies blurriness of vision Ears, nose, mouth, throat, and face: Denies mucositis or sore throat Respiratory: Denies cough, dyspnea or wheezes Cardiovascular: Denies palpitation, chest discomfort or lower extremity swelling Gastrointestinal:  Denies nausea, heartburn or change in bowel habits Skin: Denies abnormal skin rashes Lymphatics: Denies new lymphadenopathy or easy bruising Neurological:Denies numbness, tingling or new weaknesses Behavioral/Psych: Mood is stable, no new  changes  All other systems were reviewed with the patient and are negative.  I have reviewed the past medical history, past surgical history, social history and family history with the patient and they are unchanged from previous note.  ALLERGIES:  is allergic to hydromorphone.  MEDICATIONS:  Current Outpatient Prescriptions  Medication Sig Dispense Refill  . aspirin 81 MG tablet Take 81 mg by mouth.    . cholecalciferol (VITAMIN D) 1000 UNITS tablet Take 1,000 Units by mouth daily.    Marland Kitchen dexamethasone (DECADRON) 4 MG tablet TAKE 10 TABLETS BY MOUTH ONCE A WEEK. 40 tablet 0  . lenalidomide (REVLIMID) 15 MG capsule Take 1 capsule (15 mg total) by mouth daily. X 21 days on and 7 days off 21 capsule 0  . Multiple Vitamins-Minerals (ONE-A-DAY 50 PLUS PO) Take by mouth daily.    Marland Kitchen oxycodone (OXY-IR) 5 MG capsule Take 5 mg by mouth every 4 (four) hours as needed. pain    . Oxycodone HCl (OXYCONTIN) 60 MG TB12 Take 1 tablet by mouth 2 (two) times daily.    . pantoprazole (PROTONIX) 40 MG tablet TAKE (1) TABLET DAILY AS NEEDED. 30 tablet 0   No current facility-administered medications for this visit.    PHYSICAL EXAMINATION: ECOG PERFORMANCE STATUS: 1 - Symptomatic but completely ambulatory  Filed Vitals:   09/13/15 1124  BP: 113/89  Pulse: 100  Temp: 98.4 F (36.9 C)  Resp: 18   Filed Weights   09/13/15 1124  Weight: 128 lb (58.06 kg)    GENERAL:alert, no distress and comfortable SKIN: skin color, texture, turgor are normal, no rashes or significant lesions EYES: normal, Conjunctiva are pink and non-injected, sclera clear Musculoskeletal:no cyanosis of digits and no clubbing  NEURO: alert &  oriented x 3 with fluent speech, no focal motor/sensory deficits  LABORATORY DATA:  I have reviewed the data as listed    Component Value Date/Time   NA 142 09/06/2015 1107   NA 143 10/10/2011 1023   K 3.5 09/06/2015 1107   K 3.8 10/10/2011 1023   CL 107 08/30/2012 1055   CL 105  10/10/2011 1023   CO2 28 09/06/2015 1107   CO2 29 10/10/2011 1023   GLUCOSE 85 09/06/2015 1107   GLUCOSE 102* 08/30/2012 1055   GLUCOSE 74 10/10/2011 1023   BUN 12.5 09/06/2015 1107   BUN 16 10/10/2011 1023   CREATININE 0.8 09/06/2015 1107   CREATININE 0.83 10/10/2011 1023   CREATININE 0.75 07/17/2009 1541   CALCIUM 8.8 09/06/2015 1107   CALCIUM 9.3 10/10/2011 1023   PROT 5.9* 09/06/2015 1107   PROT 5.6* 09/06/2015 1107   PROT 6.7 08/19/2011 1353   ALBUMIN 3.7 09/06/2015 1107   ALBUMIN 4.2 08/19/2011 1353   AST 28 09/06/2015 1107   AST 23 08/19/2011 1353   ALT 50 09/06/2015 1107   ALT 11 08/19/2011 1353   ALKPHOS 55 09/06/2015 1107   ALKPHOS 54 08/19/2011 1353   BILITOT 0.44 09/06/2015 1107   BILITOT 0.3 08/19/2011 1353    No results found for: SPEP, UPEP  Lab Results  Component Value Date   WBC 4.1 09/06/2015   NEUTROABS 1.2* 09/06/2015   HGB 12.0 09/06/2015   HCT 36.1 09/06/2015   MCV 95.8 09/06/2015   PLT 84* 09/06/2015      Chemistry      Component Value Date/Time   NA 142 09/06/2015 1107   NA 143 10/10/2011 1023   K 3.5 09/06/2015 1107   K 3.8 10/10/2011 1023   CL 107 08/30/2012 1055   CL 105 10/10/2011 1023   CO2 28 09/06/2015 1107   CO2 29 10/10/2011 1023   BUN 12.5 09/06/2015 1107   BUN 16 10/10/2011 1023   CREATININE 0.8 09/06/2015 1107   CREATININE 0.83 10/10/2011 1023   CREATININE 0.75 07/17/2009 1541      Component Value Date/Time   CALCIUM 8.8 09/06/2015 1107   CALCIUM 9.3 10/10/2011 1023   ALKPHOS 55 09/06/2015 1107   ALKPHOS 54 08/19/2011 1353   AST 28 09/06/2015 1107   AST 23 08/19/2011 1353   ALT 50 09/06/2015 1107   ALT 11 08/19/2011 1353   BILITOT 0.44 09/06/2015 1107   BILITOT 0.3 08/19/2011 1353       ASSESSMENT & PLAN:  Multiple myeloma in relapse ([place]) The patient has no symptoms from side effects of Revlimid. I am disappointed to see lack of progress in the last 2 months. The serum light chains had reduced but less  than 50% since we started on treatment. With less than robust response, I recommend either addition of the second agent such as Daratumumab or Velcade or to switch treatment to combination therapy of Daratumumab, dexamethasone with either Velcade or Pomalidomide. I review with her the current NCCN guidelines. I gave her multiple literature handouts and discussed with the some of the expected risk and side effects of treatment. Ultimately, she cannot make a decision today. She would like to go home and think about it. I recommend she hold her Revlimid treatment now to allow blood count recovery The patient will call me within a week after she made a decision  Pancytopenia due to antineoplastic chemotherapy [facility]) This is likely treatment effects. The patient denies recent history of bleeding such as epistaxis,  hematuria or hematochezia. She is asymptomatic from the neutropenia or thrombocytopenia. We will observe for now.  I recommend she hold her treatment.    No orders of the defined types were placed in this encounter.   All questions were answered. The patient knows to call the clinic with any problems, questions or concerns. No barriers to learning was detected. I spent 40 minutes counseling the patient face to face. The total time spent in the appointment was 60 minutes and more than 50% was on counseling and review of test results     [facility], Nobuko Gsell, MD 09/13/2015 1:42 PM

## 2015-09-14 ENCOUNTER — Other Ambulatory Visit: Payer: Self-pay | Admitting: Hematology and Oncology

## 2015-09-18 ENCOUNTER — Telehealth: Payer: Self-pay | Admitting: *Deleted

## 2015-09-18 NOTE — Telephone Encounter (Signed)
Pt's husband called to discuss pt's anxiety about getting treatment.  He does not want his wife to know he called [place]. He wants Dr. Alvy Bimler and nursing staff to be aware pt is anxious about germs and possibly OCD about cleanliness. She is concerned about being in treatment area for 8 hrs to receive treatment.  It worries her when she sees inconsistencies in how different nurses do things.  He asks if any way pt can have more private and quiet area and with only one nurse?  He says pt is planning on calling Dr. Alvy Bimler to discuss which treatment she has decided on but he thinks her anxiety is hindering her decision.  Explained to husband we can request a private room for her first treatment and we can try to accommodate her anxiety as best as we can.  We certainly want her to be comfortable, but we can't promise she will be able to have private room for all her treatments or only have one nurse helping her.  We work as a Therapist, occupational.  Husband says he has encouraged his wife to discuss her concerns w/ Dr. Alvy Bimler but he is not sure she will.  He asks we do not tell her he called about this.  Informed husband I will share with Dr. Alvy Bimler and we will be expecting her call about her treatment decision.

## 2015-09-24 ENCOUNTER — Telehealth: Payer: Self-pay | Admitting: *Deleted

## 2015-09-24 NOTE — Telephone Encounter (Signed)
-----   Message from [name], MD sent at 09/24/2015  9:41 AM EDT ----- Regarding: call her tomorrow Pls call her tomorrow what she wants to do with treatment

## 2015-09-24 NOTE — Telephone Encounter (Signed)
Pt called to speak with Dr Alvy Bimler, Has decided to do 8 hour infusion.

## 2015-10-01 ENCOUNTER — Other Ambulatory Visit: Payer: Self-pay | Admitting: Hematology and Oncology

## 2015-10-01 DIAGNOSIS — C9002 Multiple myeloma in relapse: Secondary | ICD-10-CM

## 2015-10-02 ENCOUNTER — Telehealth: Payer: Self-pay | Admitting: *Deleted

## 2015-10-02 NOTE — Telephone Encounter (Signed)
Pt asked if she should continue to take dexamethasone even though she is done w/ Revlimid and taking a new treatment in a few weeks.  Instructed pt, per Dr. Alvy Bimler, stop Dexamethasone until she sees her again in August.  Informed pt will send request to Scheduler to see Dr. Alvy Bimler on Friday 8/4 at 2;30 pm w/ lab and flush prior.  Start treatment with Daratumumab on Friday 8/11.    Pt verbalized understanding.

## 2015-10-03 ENCOUNTER — Telehealth: Payer: Self-pay | Admitting: Hematology and Oncology

## 2015-10-03 ENCOUNTER — Telehealth: Payer: Self-pay | Admitting: *Deleted

## 2015-10-03 NOTE — Telephone Encounter (Signed)
spoke w/ pt confirmed aug apt dates & times

## 2015-10-03 NOTE — Telephone Encounter (Signed)
Per staff message and POF I have scheduled appts. Advised scheduler of appts. [name]  

## 2015-10-12 ENCOUNTER — Encounter: Payer: Self-pay | Admitting: Hematology and Oncology

## 2015-10-12 ENCOUNTER — Other Ambulatory Visit (HOSPITAL_BASED_OUTPATIENT_CLINIC_OR_DEPARTMENT_OTHER): Payer: 59

## 2015-10-12 ENCOUNTER — Ambulatory Visit (HOSPITAL_BASED_OUTPATIENT_CLINIC_OR_DEPARTMENT_OTHER): Payer: 59

## 2015-10-12 ENCOUNTER — Ambulatory Visit (HOSPITAL_BASED_OUTPATIENT_CLINIC_OR_DEPARTMENT_OTHER): Payer: 59 | Admitting: Hematology and Oncology

## 2015-10-12 ENCOUNTER — Telehealth: Payer: Self-pay | Admitting: Hematology and Oncology

## 2015-10-12 VITALS — BP 117/89 | HR 95 | Temp 98.3°F | Resp 18 | Wt 128.0 lb

## 2015-10-12 DIAGNOSIS — C9002 Multiple myeloma in relapse: Secondary | ICD-10-CM

## 2015-10-12 DIAGNOSIS — Z452 Encounter for adjustment and management of vascular access device: Secondary | ICD-10-CM

## 2015-10-12 DIAGNOSIS — Z95828 Presence of other vascular implants and grafts: Secondary | ICD-10-CM

## 2015-10-12 DIAGNOSIS — D61810 Antineoplastic chemotherapy induced pancytopenia: Secondary | ICD-10-CM | POA: Diagnosis not present

## 2015-10-12 DIAGNOSIS — T451X5A Adverse effect of antineoplastic and immunosuppressive drugs, initial encounter: Secondary | ICD-10-CM

## 2015-10-12 LAB — COMPREHENSIVE METABOLIC PANEL
ALT: 26 U/L (ref 0–55)
ANION GAP: 10 meq/L (ref 3–11)
AST: 26 U/L (ref 5–34)
Albumin: 3.8 g/dL (ref 3.5–5.0)
Alkaline Phosphatase: 51 U/L (ref 40–150)
BILIRUBIN TOTAL: 0.47 mg/dL (ref 0.20–1.20)
BUN: 12.8 mg/dL (ref 7.0–26.0)
CO2: 27 meq/L (ref 22–29)
Calcium: 9.4 mg/dL (ref 8.4–10.4)
Chloride: 108 mEq/L (ref 98–109)
Creatinine: 0.7 mg/dL (ref 0.6–1.1)
Glucose: 92 mg/dl (ref 70–140)
POTASSIUM: 3.7 meq/L (ref 3.5–5.1)
Sodium: 145 mEq/L (ref 136–145)
TOTAL PROTEIN: 6 g/dL — AB (ref 6.4–8.3)

## 2015-10-12 LAB — CBC WITH DIFFERENTIAL/PLATELET
BASO%: 4.2 % — ABNORMAL HIGH (ref 0.0–2.0)
Basophils Absolute: 0.1 10e3/uL (ref 0.0–0.1)
EOS%: 4.2 % (ref 0.0–7.0)
Eosinophils Absolute: 0.1 10e3/uL (ref 0.0–0.5)
HCT: 38.1 % (ref 34.8–46.6)
HGB: 12.4 g/dL (ref 11.6–15.9)
LYMPH%: 43.8 % (ref 14.0–49.7)
MCH: 31.1 pg (ref 25.1–34.0)
MCHC: 32.5 g/dL (ref 31.5–36.0)
MCV: 95.6 fL (ref 79.5–101.0)
MONO#: 0.4 10e3/uL (ref 0.1–0.9)
MONO%: 13.4 % (ref 0.0–14.0)
NEUT#: 1 10e3/uL — ABNORMAL LOW (ref 1.5–6.5)
NEUT%: 34.4 % — ABNORMAL LOW (ref 38.4–76.8)
Platelets: 109 10e3/uL — ABNORMAL LOW (ref 145–400)
RBC: 3.99 10e6/uL (ref 3.70–5.45)
RDW: 15.8 % — ABNORMAL HIGH (ref 11.2–14.5)
WBC: 2.9 10e3/uL — ABNORMAL LOW (ref 3.9–10.3)
lymph#: 1.3 10e3/uL (ref 0.9–3.3)

## 2015-10-12 MED ORDER — HEPARIN SOD (PORK) LOCK FLUSH 100 UNIT/ML IV SOLN
500.0000 [IU] | Freq: Once | INTRAVENOUS | Status: AC | PRN
  Administered 2015-10-12: 500 [IU] via INTRAVENOUS
  Filled 2015-10-12: qty 5

## 2015-10-12 MED ORDER — DEXAMETHASONE 4 MG PO TABS: 20.0000 mg | ORAL_TABLET | ORAL | 1 refills | Status: DC

## 2015-10-12 MED ORDER — PROCHLORPERAZINE MALEATE 10 MG PO TABS: 10.0000 mg | ORAL_TABLET | Freq: Four times a day (QID) | ORAL | 1 refills | Status: DC | PRN

## 2015-10-12 MED ORDER — SODIUM CHLORIDE 0.9 % IJ SOLN
10.0000 mL | INTRAMUSCULAR | Status: DC | PRN
  Administered 2015-10-12: 10 mL via INTRAVENOUS
  Filled 2015-10-12: qty 10

## 2015-10-12 MED ORDER — ACYCLOVIR 400 MG PO TABS: 400.0000 mg | ORAL_TABLET | Freq: Two times a day (BID) | ORAL | 3 refills | Status: DC

## 2015-10-12 NOTE — Telephone Encounter (Signed)
Gave pt cal & avs °

## 2015-10-12 NOTE — Assessment & Plan Note (Addendum)
The decision was made based on recent publication on NEJM and is a category 1 recommendation in NCCN guideline Role of treatment is palliative.  Daratumumab, Bortezomib, and Dexamethasone for Multiple Myeloma Lucienne Capers, M.D., Rulon Abide, M.D., Christophe Louis, M.D., Kern Reap. Luciana Axe, M.D., Miquel Dunn, M.D., Rolanda Jay, M.D., Mickel Crow, M.D., Desma Paganini, M.D., Charmayne Sheer, M.D., Nona Dell, M.D., Docia Chuck, M.D., Magda Kiel, M.D., Martinique Schecter, M.D., [place], B.S., Brayton Caves, M.S., Rande Lawman, Ph.D., Lynetta Mare, M.D., Judie Bonus, M.D., and Lilian Kapur, M.D., for the [employer]*  [employer] [id number]. DOI: 10.1056/NEJMoa1606038  In this phase 3 trial, 498 patients with relapsed or relapsed and refractory multiple myeloma was randomized to receive bortezomib (1.3 mg per square meter of body surface area) and dexamethasone (20 mg) alone (control group) or in combination with daratumumab (16 mg per kilogram of body weight) (daratumumab group). The primary end point was progression-free survival.  RESULTS A prespecified interim analysis showed that the rate of progression-free survival was significantly higher in the daratumumab group than in the control group; the 63-monthrate of progression-free survival was 60.7% in the daratumumab group versus 26.9% in the control group. After a median follow-up period of 7.4 months, the median progression-free survival was not reached in the daratumumab group and was 7.2 months in the control group (hazard ratio for progression or death with daratumumab vs. control, 0.39; 95% confidence interval, 0.28 to 0.53; P<0.001). The rate of overall response was higher in the daratumumab group than in the control group (82.9% vs. 63.2%, P<0.001), as were the rates of very good partial response or better (59.2% vs. 29.1%, P<0.001) and complete response or better (19.2% vs. 9.0%, P = 0.001). Three of the  most common grade 3 or 4 adverse events reported in the daratumumab group and the control group were thrombocytopenia (45.3% and 32.9%, respectively), anemia (14.4% and 16.0%, respectively), and neutropenia (12.8% and 4.2%, respectively). Infusion-related reactions that were associated with daratumumab treatment were reported in 45.3% of the patients in the daratumumab group; these reactions were mostly grade 1 or 2 (grade 3 in 8.6% of the patients), and in 98.2% of these patients, they occurred during the first infusion.  Some of the short term side-effects included, though not limited to, risk of fatigue, weight loss, risk of allergic reactions, pancytopenia, life-threatening infections, need for transfusions of blood products, admission to hospital for various reasons, and risks of death.   The patient is aware that the response rates discussed earlier is not guaranteed.    After a long discussion, patient made an informed decision to proceed with the prescribed plan of care.  I will start her on pulsed dexamethasone 20 mg weekly to be taken on Wednesdays and prophylaxis with acyclovir Despite recent pancytopenia, I will continue full dose Daratumumab and weekly Velcade I will see her prior to second treatment for toxicity review She will continue calcium with vitamin D supplement. Her next dose of Zometa is not due until September 2017

## 2015-10-12 NOTE — Progress Notes (Signed)
[place] OFFICE PROGRESS NOTE  Patient Care Team: [name], MD as PCP - General [name], MD as Consulting Physician (Oncology) [name], MD as Consulting Physician (Neurosurgery) [name], MD as Consulting Physician (Hematology and Oncology)  SUMMARY OF ONCOLOGIC HISTORY:   Multiple myeloma in relapse [facility])   07/21/2007 Bone Marrow Biopsy    Case #: [id number] Bone marrow showed myeloma     07/21/2007 Miscellaneous    She was diagnosed in May 2009. 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 Treatment is stopped due to minimum response and pancytopenia      INTERVAL HISTORY: Please see below for problem oriented charting. She returns to review treatment recommendation. Since discontinuation of Revlimid, she feels well. Denies recent infection. The patient denies any recent signs or symptoms of bleeding such as spontaneous epistaxis, hematuria or hematochezia. Denies worsening bone pain REVIEW OF SYSTEMS:   Constitutional: Denies fevers, chills or abnormal weight loss Eyes: Denies blurriness of vision Ears, nose, mouth, throat, and face: Denies mucositis or sore throat Respiratory: Denies cough, dyspnea or wheezes Cardiovascular: Denies palpitation, chest discomfort or  lower extremity swelling Gastrointestinal:  Denies nausea, heartburn or change in bowel habits Skin: Denies abnormal skin rashes Lymphatics: Denies new lymphadenopathy or easy bruising Neurological:Denies numbness, tingling or new weaknesses Behavioral/Psych: Mood is stable, no new changes  All other systems were reviewed with the patient and are negative.  I have reviewed the past medical history, past surgical history, social history and family history with the patient and they are unchanged from previous note.  ALLERGIES:  is allergic to hydromorphone.  MEDICATIONS:  Current Outpatient Prescriptions  Medication Sig Dispense Refill  . aspirin 81 MG tablet Take 81 mg by mouth.    . cholecalciferol (VITAMIN D) 1000 UNITS tablet Take 1,000 Units by mouth daily.    . Multiple Vitamins-Minerals (ONE-A-DAY 50 PLUS PO) Take by mouth daily.    Marland Kitchen oxycodone (OXY-IR) 5 MG capsule Take 5 mg by mouth every 4 (four) hours as needed. pain    . Oxycodone HCl (OXYCONTIN) 60 MG TB12 Take 1 tablet by mouth 2 (two) times daily.    . pantoprazole (PROTONIX) 40 MG tablet TAKE (1) TABLET DAILY AS NEEDED. 30 tablet 0  . acyclovir (ZOVIRAX) 400 MG tablet Take 1 tablet (400 mg total) by mouth 2 (two) times daily. 60 tablet 3  . dexamethasone (DECADRON) 4 MG tablet Take 5 tablets (20 mg total) by mouth once a week. 60 tablet 1  . prochlorperazine (COMPAZINE) 10 MG tablet Take 1 tablet (10 mg total) by mouth every 6 (six) hours as needed (Nausea or vomiting). 30 tablet 1  No current facility-administered medications for this visit.     PHYSICAL EXAMINATION: ECOG PERFORMANCE STATUS: 1 - Symptomatic but completely ambulatory  Vitals:   10/12/15 1428  BP: 117/89  Pulse: 95  Resp: 18  Temp: 98.3 F (36.8 C)   Filed Weights   10/12/15 1428  Weight: 128 lb (58.1 kg)    GENERAL:alert, no distress and comfortable SKIN: skin color, texture, turgor are normal, no rashes or significant lesions EYES:  normal, Conjunctiva are pink and non-injected, sclera clear Musculoskeletal:no cyanosis of digits and no clubbing  NEURO: alert & oriented x 3 with fluent speech, no focal motor/sensory deficits  LABORATORY DATA:  I have reviewed the data as listed    Component Value Date/Time   NA 145 10/12/2015 1346   K 3.7 10/12/2015 1346   CL 107 08/30/2012 1055   CO2 27 10/12/2015 1346   GLUCOSE 92 10/12/2015 1346   GLUCOSE 102 (H) 08/30/2012 1055   BUN 12.8 10/12/2015 1346   CREATININE 0.7 10/12/2015 1346   CALCIUM 9.4 10/12/2015 1346   PROT 6.0 (L) 10/12/2015 1346   ALBUMIN 3.8 10/12/2015 1346   AST 26 10/12/2015 1346   ALT 26 10/12/2015 1346   ALKPHOS 51 10/12/2015 1346   BILITOT 0.47 10/12/2015 1346    No results found for: SPEP, UPEP  Lab Results  Component Value Date   WBC 2.9 (L) 10/12/2015   NEUTROABS 1.0 (L) 10/12/2015   HGB 12.4 10/12/2015   HCT 38.1 10/12/2015   MCV 95.6 10/12/2015   PLT 109 (L) 10/12/2015      Chemistry      Component Value Date/Time   NA 145 10/12/2015 1346   K 3.7 10/12/2015 1346   CL 107 08/30/2012 1055   CO2 27 10/12/2015 1346   BUN 12.8 10/12/2015 1346   CREATININE 0.7 10/12/2015 1346      Component Value Date/Time   CALCIUM 9.4 10/12/2015 1346   ALKPHOS 51 10/12/2015 1346   AST 26 10/12/2015 1346   ALT 26 10/12/2015 1346   BILITOT 0.47 10/12/2015 1346      ASSESSMENT & PLAN:  Multiple myeloma in relapse [facility]) The decision was made based on recent publication on NEJM and is a category 1 recommendation in NCCN guideline Role of treatment is palliative.  Daratumumab, Bortezomib, and Dexamethasone for Multiple Myeloma Lucienne Capers, M.D., Rulon Abide, M.D., Christophe Louis, M.D., Kern Reap. Luciana Axe, M.D., Miquel Dunn, M.D., Rolanda Jay, M.D., Mickel Crow, M.D., Desma Paganini, M.D., Charmayne Sheer, M.D., Nona Dell, M.D., Docia Chuck, M.D., Magda Kiel, M.D., [place] Schecter, M.D., [place], B.S., Brayton Caves, M.S., Rande Lawman, Ph.D., Lynetta Mare, M.D., Judie Bonus, M.D., and Lilian Kapur, M.D., for the [employer] [employer]*  [employer] [phone]. DOI: 10.1056/NEJMoa1606038  In this phase 3 trial, 498 patients with relapsed or relapsed and refractory multiple myeloma was randomized to receive bortezomib (1.3 mg per square meter of body surface area) and dexamethasone (20 mg) alone (control group) or in combination with daratumumab (16 mg per kilogram of body weight) (daratumumab group). The primary end point was progression-free survival.  RESULTS A prespecified interim analysis showed that the rate of progression-free survival was significantly higher in the daratumumab group than in the control group; the 29-monthrate of progression-free survival was 60.7% in the daratumumab group versus 26.9% in the control group. After a median follow-up period of 7.4 months, the median progression-free survival was not reached in the daratumumab group and was 7.2 months in the  control group (hazard ratio for progression or death with daratumumab vs. control, 0.39; 95% confidence interval, 0.28 to 0.53; P<0.001). The rate of overall response was higher in the daratumumab group than in the control group (82.9% vs. 63.2%, P<0.001), as were the rates of very good partial response or better (59.2% vs. 29.1%, P<0.001) and complete response or better (19.2% vs. 9.0%, P = 0.001). Three of the most common grade 3 or 4 adverse events reported in the daratumumab group and the control group were thrombocytopenia (45.3% and 32.9%, respectively), anemia (14.4% and 16.0%, respectively), and neutropenia (12.8% and 4.2%, respectively). Infusion-related reactions that were associated with daratumumab treatment were reported in 45.3% of the patients in the daratumumab group; these reactions were mostly grade 1 or 2 (grade 3 in 8.6% of the patients), and in 98.2% of these patients, they occurred during the first infusion.  Some  of the short term side-effects included, though not limited to, risk of fatigue, weight loss, risk of allergic reactions, pancytopenia, life-threatening infections, need for transfusions of blood products, admission to hospital for various reasons, and risks of death.   The patient is aware that the response rates discussed earlier is not guaranteed.    After a long discussion, patient made an informed decision to proceed with the prescribed plan of care.  I will start her on pulsed dexamethasone 20 mg weekly to be taken on Wednesdays and prophylaxis with acyclovir Despite recent pancytopenia, I will continue full dose Daratumumab and weekly Velcade I will see her prior to second treatment for toxicity review She will continue calcium with vitamin D supplement. Her next dose of Zometa is not due until September 2017     Pancytopenia due to antineoplastic chemotherapy [facility]) This is likely treatment effects. The patient denies recent history of bleeding such as epistaxis, hematuria or hematochezia. She is asymptomatic from the neutropenia or thrombocytopenia. We will observe for now.    Orders Placed This Encounter  Procedures  . CBC with Differential    Standing Status:   Standing    Number of Occurrences:   20    Standing Expiration Date:   10/12/2016  . Comprehensive metabolic panel    Standing Status:   Standing    Number of Occurrences:   20    Standing Expiration Date:   10/12/2016  . Type and screen    Standing Status:   Future    Standing Expiration Date:   10/11/2016   All questions were answered. The patient knows to call the clinic with any problems, questions or concerns. No barriers to learning was detected. I spent 30 minutes counseling the patient face to face. The total time spent in the appointment was 40 minutes and more than 50% was on counseling and review of test results     [facility], [place], MD 10/12/2015 6:08 PM

## 2015-10-12 NOTE — Assessment & Plan Note (Signed)
This is likely treatment effects. The patient denies recent history of bleeding such as epistaxis, hematuria or hematochezia. She is asymptomatic from the neutropenia or thrombocytopenia. We will observe for now.

## 2015-10-15 LAB — KAPPA/LAMBDA LIGHT CHAINS
Ig Kappa Free Light Chain: 559.9 mg/L — ABNORMAL HIGH (ref 3.3–19.4)
Ig Lambda Free Light Chain: 6.2 mg/L (ref 5.7–26.3)
Kappa/Lambda FluidC Ratio: 90.31 — ABNORMAL HIGH (ref 0.26–1.65)

## 2015-10-16 ENCOUNTER — Telehealth: Payer: Self-pay | Admitting: *Deleted

## 2015-10-16 NOTE — Telephone Encounter (Signed)
Pt reports a "Summer Cold."  Started feeling cold on Sunday night.   Sore throat on one side of her throat and congestion one nostril started on Monday.  Denies any fevers.   Continues to have some congestion and a little sore throat today. Tylenol helped her symptoms.  Pt asks if ok to start treatment on Friday as scheduled?  Informed pt per Dr. Alvy Bimler ok to start treatment as planned with above cold symptoms.   Call us back if she develops fever or symptoms worsen before Friday.  She verbalized understanding.

## 2015-10-17 LAB — MULTIPLE MYELOMA PANEL, SERUM
Albumin SerPl Elph-Mcnc: 3.8 g/dL (ref 2.9–4.4)
Albumin/Glob SerPl: 2.1 — ABNORMAL HIGH (ref 0.7–1.7)
Alpha 1: 0.3 g/dL (ref 0.0–0.4)
Alpha2 Glob SerPl Elph-Mcnc: 0.6 g/dL (ref 0.4–1.0)
B-Globulin SerPl Elph-Mcnc: 0.8 g/dL (ref 0.7–1.3)
Gamma Glob SerPl Elph-Mcnc: 0.3 g/dL — ABNORMAL LOW (ref 0.4–1.8)
Globulin, Total: 1.9 g/dL — ABNORMAL LOW (ref 2.2–3.9)
IgA, Qn, Serum: 12 mg/dL — ABNORMAL LOW (ref 87–352)
IgG, Qn, Serum: 370 mg/dL — ABNORMAL LOW (ref 700–1600)
IgM, Qn, Serum: 17 mg/dL — ABNORMAL LOW (ref 26–217)
TOTAL PROTEIN: 5.7 g/dL — AB (ref 6.0–8.5)

## 2015-10-18 ENCOUNTER — Encounter: Payer: Self-pay | Admitting: Pharmacist

## 2015-10-19 ENCOUNTER — Other Ambulatory Visit (HOSPITAL_BASED_OUTPATIENT_CLINIC_OR_DEPARTMENT_OTHER): Payer: 59

## 2015-10-19 ENCOUNTER — Ambulatory Visit (HOSPITAL_COMMUNITY)
Admission: RE | Admit: 2015-10-19 | Discharge: 2015-10-19 | Disposition: A | Discharge status: Home / Self Care | Payer: 59 | Source: Ambulatory Visit | Attending: Hematology and Oncology | Admitting: Hematology and Oncology

## 2015-10-19 ENCOUNTER — Ambulatory Visit (HOSPITAL_BASED_OUTPATIENT_CLINIC_OR_DEPARTMENT_OTHER): Payer: 59

## 2015-10-19 VITALS — BP 106/69 | HR 82 | Temp 98.0°F | Resp 17

## 2015-10-19 DIAGNOSIS — Z5112 Encounter for antineoplastic immunotherapy: Secondary | ICD-10-CM | POA: Diagnosis not present

## 2015-10-19 DIAGNOSIS — C9002 Multiple myeloma in relapse: Secondary | ICD-10-CM | POA: Insufficient documentation

## 2015-10-19 LAB — COMPREHENSIVE METABOLIC PANEL
ALT: 19 U/L (ref 0–55)
AST: 24 U/L (ref 5–34)
Albumin: 3.9 g/dL (ref 3.5–5.0)
Alkaline Phosphatase: 56 U/L (ref 40–150)
Anion Gap: 10 mEq/L (ref 3–11)
BUN: 17.8 mg/dL (ref 7.0–26.0)
CHLORIDE: 103 meq/L (ref 98–109)
CO2: 27 meq/L (ref 22–29)
Calcium: 9.4 mg/dL (ref 8.4–10.4)
Creatinine: 0.8 mg/dL (ref 0.6–1.1)
EGFR: 81 mL/min/{1.73_m2} — ABNORMAL LOW (ref >90)
GLUCOSE: 85 mg/dL (ref 70–140)
Potassium: 4 mEq/L (ref 3.5–5.1)
SODIUM: 140 meq/L (ref 136–145)
Total Bilirubin: 0.45 mg/dL (ref 0.20–1.20)
Total Protein: 6.5 g/dL (ref 6.4–8.3)

## 2015-10-19 LAB — CBC WITH DIFFERENTIAL/PLATELET
BASO%: 1.1 % (ref 0.0–2.0)
Basophils Absolute: 0.1 10*3/uL (ref 0.0–0.1)
EOS%: 1.1 % (ref 0.0–7.0)
Eosinophils Absolute: 0.1 10*3/uL (ref 0.0–0.5)
HCT: 37.5 % (ref 34.8–46.6)
HGB: 12.4 g/dL (ref 11.6–15.9)
LYMPH%: 38.4 % (ref 14.0–49.7)
MCH: 31.3 pg (ref 25.1–34.0)
MCHC: 33 g/dL (ref 31.5–36.0)
MCV: 94.8 fL (ref 79.5–101.0)
MONO#: 0.5 10*3/uL (ref 0.1–0.9)
MONO%: 9.4 % (ref 0.0–14.0)
NEUT#: 2.4 10*3/uL (ref 1.5–6.5)
NEUT%: 50 % (ref 38.4–76.8)
Platelets: 128 10*3/uL — ABNORMAL LOW (ref 145–400)
RBC: 3.95 10*6/uL (ref 3.70–5.45)
RDW: 16 % — ABNORMAL HIGH (ref 11.2–14.5)
WBC: 4.8 10*3/uL (ref 3.9–10.3)
lymph#: 1.9 10*3/uL (ref 0.9–3.3)

## 2015-10-19 LAB — TYPE AND SCREEN
ABO/RH(D): O POS
Antibody Screen: NEGATIVE

## 2015-10-19 MED ORDER — DIPHENHYDRAMINE HCL 25 MG PO CAPS
ORAL_CAPSULE | ORAL | Status: AC
  Filled 2015-10-19: qty 2

## 2015-10-19 MED ORDER — SODIUM CHLORIDE 0.9% FLUSH
10.0000 mL | INTRAVENOUS | Status: DC | PRN
  Administered 2015-10-19: 10 mL
  Filled 2015-10-19: qty 10

## 2015-10-19 MED ORDER — PROCHLORPERAZINE MALEATE 10 MG PO TABS
ORAL_TABLET | ORAL | Status: AC
  Filled 2015-10-19: qty 1

## 2015-10-19 MED ORDER — SODIUM CHLORIDE 0.9 % IV SOLN
15.4000 mg/kg | Freq: Once | INTRAVENOUS | Status: AC
  Administered 2015-10-19: 900 mg via INTRAVENOUS
  Filled 2015-10-19: qty 40

## 2015-10-19 MED ORDER — MONTELUKAST SODIUM 10 MG PO TABS
10.0000 mg | ORAL_TABLET | Freq: Once | ORAL | Status: AC
  Administered 2015-10-19: 10 mg via ORAL
  Filled 2015-10-19: qty 1

## 2015-10-19 MED ORDER — PROCHLORPERAZINE MALEATE 10 MG PO TABS: 10.0000 mg | ORAL_TABLET | Freq: Once | ORAL | Status: DC

## 2015-10-19 MED ORDER — METHYLPREDNISOLONE SODIUM SUCC 125 MG IJ SOLR
125.0000 mg | Freq: Once | INTRAMUSCULAR | Status: AC
  Administered 2015-10-19: 125 mg via INTRAVENOUS

## 2015-10-19 MED ORDER — ACETAMINOPHEN 325 MG PO TABS
650.0000 mg | ORAL_TABLET | Freq: Once | ORAL | Status: AC
  Administered 2015-10-19: 650 mg via ORAL

## 2015-10-19 MED ORDER — ACETAMINOPHEN 325 MG PO TABS
ORAL_TABLET | ORAL | Status: AC
  Filled 2015-10-19: qty 2

## 2015-10-19 MED ORDER — SODIUM CHLORIDE 0.9 % IV SOLN
Freq: Once | INTRAVENOUS | Status: AC
  Administered 2015-10-19: 10:00:00 via INTRAVENOUS

## 2015-10-19 MED ORDER — HEPARIN SOD (PORK) LOCK FLUSH 100 UNIT/ML IV SOLN
500.0000 [IU] | Freq: Once | INTRAVENOUS | Status: AC | PRN
  Administered 2015-10-19: 500 [IU]
  Filled 2015-10-19: qty 5

## 2015-10-19 MED ORDER — PROCHLORPERAZINE MALEATE 10 MG PO TABS
10.0000 mg | ORAL_TABLET | Freq: Once | ORAL | Status: AC
  Administered 2015-10-19: 10 mg via ORAL

## 2015-10-19 MED ORDER — DIPHENHYDRAMINE HCL 25 MG PO CAPS
50.0000 mg | ORAL_CAPSULE | Freq: Once | ORAL | Status: AC
  Administered 2015-10-19: 50 mg via ORAL

## 2015-10-19 MED ORDER — BORTEZOMIB CHEMO SQ INJECTION 3.5 MG (2.5MG/ML)
1.3000 mg/m2 | Freq: Once | INTRAMUSCULAR | Status: AC
  Administered 2015-10-19: 2 mg via SUBCUTANEOUS
  Filled 2015-10-19: qty 2

## 2015-10-19 MED ORDER — METHYLPREDNISOLONE SODIUM SUCC 125 MG IJ SOLR
INTRAMUSCULAR | Status: AC
  Filled 2015-10-19: qty 2

## 2015-10-19 NOTE — Patient Instructions (Signed)
[facility] Discharge Instructions for Patients Receiving Chemotherapy  Today you received the following chemotherapy agents: daratumumab, velcade  To help prevent nausea and vomiting after your treatment, we encourage you to take your nausea medication.  Take it as often as prescribed.     If you develop nausea and vomiting that is not controlled by your nausea medication, call the clinic. If it is after clinic hours your family physician or the after hours number for the clinic or go to the Emergency Department.   BELOW ARE SYMPTOMS THAT SHOULD BE REPORTED IMMEDIATELY:  *FEVER GREATER THAN 100.5 F  *CHILLS WITH OR WITHOUT FEVER  NAUSEA AND VOMITING THAT IS NOT CONTROLLED WITH YOUR NAUSEA MEDICATION  *UNUSUAL SHORTNESS OF BREATH  *UNUSUAL BRUISING OR BLEEDING  TENDERNESS IN MOUTH AND THROAT WITH OR WITHOUT PRESENCE OF ULCERS  *URINARY PROBLEMS  *BOWEL PROBLEMS  UNUSUAL RASH Items with * indicate a potential emergency and should be followed up as soon as possible.  One of the nurses will contact you 24 hours after your treatment. Please let the nurse know about any problems that you may have experienced. Feel free to call the clinic you have any questions or concerns. The clinic phone number is [phone].   I have been informed and understand all the instructions given to me. I know to contact the clinic, my physician, or go to the Emergency Department if any problems should occur. I do not have any questions at this time, but understand that I may call the clinic during office hours   should I have any questions or need assistance in obtaining follow up care.    __________________________________________  _____________  __________ Signature of Patient or Authorized Representative            Date                   Time    __________________________________________ Nurse's Signature   Daratumumab injection What is this medicine? DARATUMUMAB (dar a  toom ue mab) is a monoclonal antibody. It is used to treat multiple myeloma. This medicine may be used for other purposes; ask your health care provider or pharmacist if you have questions. What should I tell my health care provider before I take this medicine? They need to know if you have any of these conditions: -infection (especially a virus infection such as chickenpox, cold sores, or herpes) -lung or breathing disease -pregnant or trying to get pregnant -breast-feeding -an unusual or allergic reaction to daratumumab, other medicines, foods, dyes, or preservatives How should I use this medicine? This medicine is for infusion into a vein. It is given by a health care professional in a hospital or clinic setting. Talk to your pediatrician regarding the use of this medicine in children. Special care may be needed. Overdosage: If you think you have taken too much of this medicine contact a poison control center or emergency room at once. NOTE: This medicine is only for you. Do not share this medicine with others. What if I miss a dose? Keep appointments for follow-up doses as directed. It is important not to miss your dose. Call your doctor or health care professional if you are unable to keep an appointment. What may interact with this medicine? Interactions have not been studied. Give your health care provider a list of all the medicines, herbs, non-prescription drugs, or dietary supplements you use. Also tell them if you smoke, drink alcohol, or use illegal drugs. Some items  may interact with your medicine. This list may not describe all possible interactions. Give your health care provider a list of all the medicines, herbs, non-prescription drugs, or dietary supplements you use. Also tell them if you smoke, drink alcohol, or use illegal drugs. Some items may interact with your medicine. What should I watch for while using this medicine? This drug may make you feel generally unwell. Report  any side effects. Continue your course of treatment even though you feel ill unless your doctor tells you to stop. This medicine can cause serious allergic reactions. To reduce your risk you may need to take medicine before treatment with this medicine. Take your medicine as directed. This medicine can affect the results of blood tests to match your blood type. These changes can last for up to 6 months after the final dose. Your healthcare provider will do blood tests to match your blood type before you start treatment. Tell all of your healthcare providers that you are being treated with this medicine before receiving a blood transfusion. This medicine can affect the results of some tests used to determine treatment response; extra tests may be needed to evaluate response. Do not become pregnant while taking this medicine or for 3 months after stopping it. Women should inform their doctor if they wish to become pregnant or think they might be pregnant. There is a potential for serious side effects to an unborn child. Talk to your health care professional or pharmacist for more information. What side effects may I notice from receiving this medicine? Side effects that you should report to your doctor or health care professional as soon as possible: -allergic reactions like skin rash, itching or hives, swelling of the face, lips, or tongue -breathing problems -chills -cough -dizziness -feeling faint or lightheaded -headache -nausea, vomiting -shortness of breath Side effects that usually do not require medical attention (Report these to your doctor or health care professional if they continue or are bothersome.): -back pain -fever -joint pain -loss of appetite -tiredness This list may not describe all possible side effects. Call your doctor for medical advice about side effects. You may report side effects to FDA at 1-800-FDA-1088. Where should I keep my medicine? Keep out of the reach of  children. This drug is given in a hospital or clinic and will not be stored at home. NOTE: This sheet is a summary. It may not cover all possible information. If you have questions about this medicine, talk to your doctor, pharmacist, or health care provider.    2016, Elsevier/Gold Standard. (2014-04-25 17:02:23)    Bortezomib injection (velcade) What is this medicine? BORTEZOMIB (bor TEZ oh mib) is a medicine that targets proteins in cancer cells and stops the cancer cells from growing. It is used to treat multiple myeloma and mantle-cell lymphoma. This medicine may be used for other purposes; ask your health care provider or pharmacist if you have questions. What should I tell my health care provider before I take this medicine? They need to know if you have any of these conditions: -diabetes -heart disease -irregular heartbeat -liver disease -on hemodialysis -low blood counts, like low white blood cells, platelets, or hemoglobin -peripheral neuropathy -taking medicine for blood pressure -an unusual or allergic reaction to bortezomib, mannitol, boron, other medicines, foods, dyes, or preservatives -pregnant or trying to get pregnant -breast-feeding How should I use this medicine? This medicine is for injection into a vein or for injection under the skin. It is given by a health care  professional in a hospital or clinic setting. Talk to your pediatrician regarding the use of this medicine in children. Special care may be needed. Overdosage: If you think you have taken too much of this medicine contact a poison control center or emergency room at once. NOTE: This medicine is only for you. Do not share this medicine with others. What if I miss a dose? It is important not to miss your dose. Call your doctor or health care professional if you are unable to keep an appointment. What may interact with this medicine? This medicine may interact with the following  medications: -ketoconazole -rifampin -ritonavir -St. John's Wort This list may not describe all possible interactions. Give your health care provider a list of all the medicines, herbs, non-prescription drugs, or dietary supplements you use. Also tell them if you smoke, drink alcohol, or use illegal drugs. Some items may interact with your medicine. What should I watch for while using this medicine? Visit your doctor for checks on your progress. This drug may make you feel generally unwell. This is not uncommon, as chemotherapy can affect healthy cells as well as cancer cells. Report any side effects. Continue your course of treatment even though you feel ill unless your doctor tells you to stop. You may get drowsy or dizzy. Do not drive, use machinery, or do anything that needs mental alertness until you know how this medicine affects you. Do not stand or sit up quickly, especially if you are an older patient. This reduces the risk of dizzy or fainting spells. In some cases, you may be given additional medicines to help with side effects. Follow all directions for their use. Call your doctor or health care professional for advice if you get a fever, chills or sore throat, or other symptoms of a cold or flu. Do not treat yourself. This drug decreases your body's ability to fight infections. Try to avoid being around people who are sick. This medicine may increase your risk to bruise or bleed. Call your doctor or health care professional if you notice any unusual bleeding. You may need blood work done while you are taking this medicine. In some patients, this medicine may cause a serious brain infection that may cause death. If you have any problems seeing, thinking, speaking, walking, or standing, tell your doctor right away. If you cannot reach your doctor, urgently seek other source of medical care. Do not become pregnant while taking this medicine. Women should inform their doctor if they wish to  become pregnant or think they might be pregnant. There is a potential for serious side effects to an unborn child. Talk to your health care professional or pharmacist for more information. Do not breast-feed an infant while taking this medicine. Check with your doctor or health care professional if you get an attack of severe diarrhea, nausea and vomiting, or if you sweat a lot. The loss of too much body fluid can make it dangerous for you to take this medicine. What side effects may I notice from receiving this medicine? Side effects that you should report to your doctor or health care professional as soon as possible: -allergic reactions like skin rash, itching or hives, swelling of the face, lips, or tongue -breathing problems -changes in hearing -changes in vision -fast, irregular heartbeat -feeling faint or lightheaded, falls -pain, tingling, numbness in the hands or feet -right upper belly pain -seizures -swelling of the ankles, feet, hands -unusual bleeding or bruising -unusually weak or tired -vomiting -yellowing  of the eyes or skin Side effects that usually do not require medical attention (report to your doctor or health care professional if they continue or are bothersome): -changes in emotions or moods -constipation -diarrhea -loss of appetite -headache -irritation at site where injected -nausea This list may not describe all possible side effects. Call your doctor for medical advice about side effects. You may report side effects to FDA at 1-800-FDA-1088. Where should I keep my medicine? This drug is given in a hospital or clinic and will not be stored at home. NOTE: This sheet is a summary. It may not cover all possible information. If you have questions about this medicine, talk to your doctor, pharmacist, or health care provider.    2016, Elsevier/Gold Standard. (2014-04-25 14:47:04)

## 2015-10-19 NOTE — Progress Notes (Signed)
Per Dr. Alvy Bimler, ok to begin treatment prior to Cmet results.

## 2015-10-23 ENCOUNTER — Telehealth: Payer: Self-pay | Admitting: *Deleted

## 2015-10-23 NOTE — Telephone Encounter (Signed)
Husband called requesting [place] call patient regarding pain/ follow up.  Called patient to follow up. States she has been achy and stiff in upper back and neck since Sunday. Deep pain on Sunday and Monday, feeling better today but has been resting today. Was feeling OK on Saturday. Has been using Oxycontin every 12 hours and then using Oxy IR in between.  Used tylenol with some relief.  Dr Alvy Bimler states this is normal. Pt verbalized understanding

## 2015-10-26 ENCOUNTER — Ambulatory Visit (HOSPITAL_BASED_OUTPATIENT_CLINIC_OR_DEPARTMENT_OTHER): Payer: 59

## 2015-10-26 ENCOUNTER — Other Ambulatory Visit (HOSPITAL_BASED_OUTPATIENT_CLINIC_OR_DEPARTMENT_OTHER): Payer: 59

## 2015-10-26 ENCOUNTER — Ambulatory Visit (HOSPITAL_BASED_OUTPATIENT_CLINIC_OR_DEPARTMENT_OTHER): Payer: 59 | Admitting: Hematology and Oncology

## 2015-10-26 ENCOUNTER — Encounter: Payer: Self-pay | Admitting: Hematology and Oncology

## 2015-10-26 VITALS — BP 96/71 | HR 74 | Temp 97.0°F | Resp 18

## 2015-10-26 VITALS — BP 119/85 | HR 72 | Temp 97.6°F | Resp 18

## 2015-10-26 DIAGNOSIS — G8929 Other chronic pain: Secondary | ICD-10-CM

## 2015-10-26 DIAGNOSIS — M549 Dorsalgia, unspecified: Secondary | ICD-10-CM | POA: Diagnosis not present

## 2015-10-26 DIAGNOSIS — D696 Thrombocytopenia, unspecified: Secondary | ICD-10-CM

## 2015-10-26 DIAGNOSIS — C9002 Multiple myeloma in relapse: Secondary | ICD-10-CM | POA: Diagnosis not present

## 2015-10-26 DIAGNOSIS — Z5112 Encounter for antineoplastic immunotherapy: Secondary | ICD-10-CM | POA: Diagnosis not present

## 2015-10-26 LAB — COMPREHENSIVE METABOLIC PANEL
ALT: 14 U/L (ref 0–55)
AST: 19 U/L (ref 5–34)
Albumin: 4 g/dL (ref 3.5–5.0)
Alkaline Phosphatase: 58 U/L (ref 40–150)
Anion Gap: 10 mEq/L (ref 3–11)
BUN: 20 mg/dL (ref 7.0–26.0)
CHLORIDE: 105 meq/L (ref 98–109)
CO2: 25 meq/L (ref 22–29)
CREATININE: 0.8 mg/dL (ref 0.6–1.1)
Calcium: 9 mg/dL (ref 8.4–10.4)
EGFR: 88 mL/min/{1.73_m2} — ABNORMAL LOW (ref >90)
GLUCOSE: 81 mg/dL (ref 70–140)
Potassium: 3.8 mEq/L (ref 3.5–5.1)
SODIUM: 140 meq/L (ref 136–145)
Total Bilirubin: 0.49 mg/dL (ref 0.20–1.20)
Total Protein: 6.2 g/dL — ABNORMAL LOW (ref 6.4–8.3)

## 2015-10-26 LAB — CBC WITH DIFFERENTIAL/PLATELET
BASO%: 0.5 % (ref 0.0–2.0)
Basophils Absolute: 0 10*3/uL (ref 0.0–0.1)
EOS%: 1.7 % (ref 0.0–7.0)
Eosinophils Absolute: 0.1 10*3/uL (ref 0.0–0.5)
HEMATOCRIT: 36.6 % (ref 34.8–46.6)
HGB: 12.2 g/dL (ref 11.6–15.9)
LYMPH#: 2.1 10*3/uL (ref 0.9–3.3)
LYMPH%: 34.9 % (ref 14.0–49.7)
MCH: 31.7 pg (ref 25.1–34.0)
MCHC: 33.3 g/dL (ref 31.5–36.0)
MCV: 95.1 fL (ref 79.5–101.0)
MONO#: 0.7 10*3/uL (ref 0.1–0.9)
MONO%: 11.7 % (ref 0.0–14.0)
NEUT#: 3 10*3/uL (ref 1.5–6.5)
NEUT%: 51.2 % (ref 38.4–76.8)
Platelets: 141 10*3/uL — ABNORMAL LOW (ref 145–400)
RBC: 3.85 10*6/uL (ref 3.70–5.45)
RDW: 15 % — ABNORMAL HIGH (ref 11.2–14.5)
WBC: 5.9 10*3/uL (ref 3.9–10.3)

## 2015-10-26 MED ORDER — BORTEZOMIB CHEMO SQ INJECTION 3.5 MG (2.5MG/ML)
1.3000 mg/m2 | Freq: Once | INTRAMUSCULAR | Status: AC
  Administered 2015-10-26: 2 mg via SUBCUTANEOUS
  Filled 2015-10-26: qty 2

## 2015-10-26 MED ORDER — METHYLPREDNISOLONE SODIUM SUCC 125 MG IJ SOLR
125.0000 mg | Freq: Once | INTRAMUSCULAR | Status: AC
  Administered 2015-10-26: 125 mg via INTRAVENOUS

## 2015-10-26 MED ORDER — DARATUMUMAB CHEMO INJECTION 400 MG/20ML
15.5000 mg/kg | Freq: Once | INTRAVENOUS | Status: AC
  Administered 2015-10-26: 900 mg via INTRAVENOUS
  Filled 2015-10-26: qty 40

## 2015-10-26 MED ORDER — ACETAMINOPHEN 325 MG PO TABS
650.0000 mg | ORAL_TABLET | Freq: Once | ORAL | Status: AC
  Administered 2015-10-26: 650 mg via ORAL

## 2015-10-26 MED ORDER — SODIUM CHLORIDE 0.9 % IV SOLN
Freq: Once | INTRAVENOUS | Status: AC
  Administered 2015-10-26: 10:00:00 via INTRAVENOUS

## 2015-10-26 MED ORDER — DIPHENHYDRAMINE HCL 25 MG PO CAPS
50.0000 mg | ORAL_CAPSULE | Freq: Once | ORAL | Status: AC
  Administered 2015-10-26: 50 mg via ORAL

## 2015-10-26 MED ORDER — DIPHENHYDRAMINE HCL 25 MG PO CAPS
ORAL_CAPSULE | ORAL | Status: AC
Start: 2015-10-26 — End: 2015-10-26
  Filled 2015-10-26: qty 2

## 2015-10-26 MED ORDER — SODIUM CHLORIDE 0.9% FLUSH
10.0000 mL | INTRAVENOUS | Status: DC | PRN
  Administered 2015-10-26: 10 mL
  Filled 2015-10-26: qty 10

## 2015-10-26 MED ORDER — PROCHLORPERAZINE MALEATE 10 MG PO TABS
ORAL_TABLET | ORAL | Status: AC
  Filled 2015-10-26: qty 1

## 2015-10-26 MED ORDER — PROCHLORPERAZINE MALEATE 10 MG PO TABS
10.0000 mg | ORAL_TABLET | Freq: Once | ORAL | Status: AC
  Administered 2015-10-26: 10 mg via ORAL

## 2015-10-26 MED ORDER — PROCHLORPERAZINE MALEATE 10 MG PO TABS: 10.0000 mg | ORAL_TABLET | Freq: Once | ORAL | Status: DC

## 2015-10-26 MED ORDER — HEPARIN SOD (PORK) LOCK FLUSH 100 UNIT/ML IV SOLN
500.0000 [IU] | Freq: Once | INTRAVENOUS | Status: AC | PRN
  Administered 2015-10-26: 500 [IU]
  Filled 2015-10-26: qty 5

## 2015-10-26 MED ORDER — ACETAMINOPHEN 325 MG PO TABS
ORAL_TABLET | ORAL | Status: AC
  Filled 2015-10-26: qty 2

## 2015-10-26 MED ORDER — METHYLPREDNISOLONE SODIUM SUCC 125 MG IJ SOLR
INTRAMUSCULAR | Status: AC
  Filled 2015-10-26: qty 2

## 2015-10-26 MED ORDER — MONTELUKAST SODIUM 10 MG PO TABS
10.0000 mg | ORAL_TABLET | Freq: Once | ORAL | Status: AC
  Administered 2015-10-26: 10 mg via ORAL
  Filled 2015-10-26: qty 1

## 2015-10-26 NOTE — Assessment & Plan Note (Signed)
The cause is unknown. It is mild and there is little change compared from previous platelet count. The patient denies recent history of bleeding such as epistaxis, hematuria or hematochezia. She is asymptomatic from the thrombocytopenia. I will observe for now.  

## 2015-10-26 NOTE — Assessment & Plan Note (Signed)
So far, she tolerated treatment well. She will continue pulsed dose dexamethasone every week, Velcade every week along with Daratumumab She will be due to Zometa next month. She will continue antimicrobial prophylaxis with acyclovir and calcium with vitamin D supplement

## 2015-10-26 NOTE — Progress Notes (Signed)
[facility] OFFICE PROGRESS NOTE  Patient Care Team: [name], MD as PCP - General [name], MD as Consulting Physician (Oncology) [name], MD as Consulting Physician (Neurosurgery) [name], MD as Consulting Physician (Hematology and Oncology)  SUMMARY OF ONCOLOGIC HISTORY:   Multiple myeloma in relapse [facility])   07/21/2007 Bone Marrow Biopsy    Case #: [id number] Bone marrow showed myeloma      07/21/2007 Miscellaneous    She was diagnosed in May 2009. 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 Treatment is stopped due to minimum response and pancytopenia      10/19/2015 -  Chemotherapy    The patient had bortezomib SQ (VELCADE) chemo injection 2 mg, 1.3 mg/m2 = 2 mg, Subcutaneous,  Once, 2 of 8 cycles  for chemotherapy treatment along with weekly Daratumumab       INTERVAL HISTORY: Please see below for problem oriented charting. She is seen in the infusion room She tolerated last week's treatment well without any side effects. Denies peripheral neuropathy. Her back pain remained the same. The patient denies any recent signs or symptoms of bleeding such as spontaneous epistaxis, hematuria or hematochezia.  REVIEW OF SYSTEMS:   Constitutional: Denies  fevers, chills or abnormal weight loss Eyes: Denies blurriness of vision Ears, nose, mouth, throat, and face: Denies mucositis or sore throat Respiratory: Denies cough, dyspnea or wheezes Cardiovascular: Denies palpitation, chest discomfort or lower extremity swelling Gastrointestinal:  Denies nausea, heartburn or change in bowel habits Skin: Denies abnormal skin rashes Lymphatics: Denies new lymphadenopathy or easy bruising Neurological:Denies numbness, tingling or new weaknesses Behavioral/Psych: Mood is stable, no new changes  All other systems were reviewed with the patient and are negative.  I have reviewed the past medical history, past surgical history, social history and family history with the patient and they are unchanged from previous note.  ALLERGIES:  is allergic to hydromorphone.  MEDICATIONS:  Current Outpatient Prescriptions  Medication Sig Dispense Refill  . acyclovir (ZOVIRAX) 400 MG tablet Take 1 tablet (400 mg total) by mouth 2 (two) times daily. 60 tablet 3  . aspirin 81 MG tablet Take 81 mg by mouth.    . cholecalciferol (VITAMIN D) 1000 UNITS tablet Take 1,000 Units by mouth daily.    Marland Kitchen dexamethasone (DECADRON) 4 MG tablet Take 5 tablets (20 mg total) by mouth once a week. 60 tablet 1  . Multiple Vitamins-Minerals (ONE-A-DAY 50 PLUS PO) Take by mouth daily.    Marland Kitchen oxycodone (OXY-IR) 5 MG capsule Take 5 mg by mouth every 4 (four) hours as needed. pain    . Oxycodone HCl (OXYCONTIN)  60 MG TB12 Take 1 tablet by mouth 2 (two) times daily.    . pantoprazole (PROTONIX) 40 MG tablet TAKE (1) TABLET DAILY AS NEEDED. 30 tablet 0  . prochlorperazine (COMPAZINE) 10 MG tablet Take 1 tablet (10 mg total) by mouth every 6 (six) hours as needed (Nausea or vomiting). 30 tablet 1   No current facility-administered medications for this visit.    Facility-Administered Medications Ordered in Other Visits  Medication Dose Route Frequency Provider Last Rate Last Dose  . heparin lock  flush 100 unit/mL  500 Units Intracatheter Once PRN [name], MD      . prochlorperazine (COMPAZINE) tablet 10 mg  10 mg Oral Once [name], MD      . sodium chloride flush (NS) 0.9 % injection 10 mL  10 mL Intracatheter PRN [name], MD        PHYSICAL EXAMINATION: ECOG PERFORMANCE STATUS: 1 - Symptomatic but completely ambulatory  Vitals:   10/26/15 0945  BP: 119/85  Pulse: 72  Resp: 18  Temp: 97.6 F (36.4 C)   There were no vitals filed for this visit.  GENERAL:alert, no distress and comfortable SKIN: skin color, texture, turgor are normal, no rashes or significant lesions EYES: normal, Conjunctiva are pink and non-injected, sclera clear OROPHARYNX:no exudate, no erythema and lips, buccal mucosa, and tongue normal  NECK: supple, thyroid normal size, non-tender, without nodularity LYMPH:  no palpable lymphadenopathy in the cervical, axillary or inguinal LUNGS: clear to auscultation and percussion with normal breathing effort HEART: regular rate & rhythm and no murmurs and no lower extremity edema ABDOMEN:abdomen soft, non-tender and normal bowel sounds Musculoskeletal:no cyanosis of digits and no clubbing  NEURO: alert & oriented x 3 with fluent speech, no focal motor/sensory deficits  LABORATORY DATA:  I have reviewed the data as listed    Component Value Date/Time   NA 140 10/26/2015 0852   K 3.8 10/26/2015 0852   CL 107 08/30/2012 1055   CO2 25 10/26/2015 0852   GLUCOSE 81 10/26/2015 0852   GLUCOSE 102 (H) 08/30/2012 1055   BUN 20.0 10/26/2015 0852   CREATININE 0.8 10/26/2015 0852   CALCIUM 9.0 10/26/2015 0852   PROT 6.2 (L) 10/26/2015 0852   ALBUMIN 4.0 10/26/2015 0852   AST 19 10/26/2015 0852   ALT 14 10/26/2015 0852   ALKPHOS 58 10/26/2015 0852   BILITOT 0.49 10/26/2015 0852    No results found for: SPEP, UPEP  Lab Results  Component Value Date   WBC 5.9 10/26/2015   NEUTROABS 3.0 10/26/2015   HGB 12.2 10/26/2015   HCT 36.6 10/26/2015   MCV  95.1 10/26/2015   PLT 141 (L) 10/26/2015      Chemistry      Component Value Date/Time   NA 140 10/26/2015 0852   K 3.8 10/26/2015 0852   CL 107 08/30/2012 1055   CO2 25 10/26/2015 0852   BUN 20.0 10/26/2015 0852   CREATININE 0.8 10/26/2015 0852      Component Value Date/Time   CALCIUM 9.0 10/26/2015 0852   ALKPHOS 58 10/26/2015 0852   AST 19 10/26/2015 0852   ALT 14 10/26/2015 0852   BILITOT 0.49 10/26/2015 0852      ASSESSMENT & PLAN:  Multiple myeloma in relapse ([place]) So far, she tolerated treatment well. She will continue pulsed dose dexamethasone every week, Velcade every week along with Daratumumab She will be due to Zometa next month. She will continue antimicrobial prophylaxis with acyclovir and calcium with vitamin  D supplement  Thrombocytopenia The cause is unknown. It is mild and there is little change compared from previous platelet count. The patient denies recent history of bleeding such as epistaxis, hematuria or hematochezia. She is asymptomatic from the thrombocytopenia. I will observe for now.   Chronic back pain greater than 3 months duration Her back pain is drastically improved She will continue close follow-up at the pain clinic. She will continue calcium and vitamin D. I will start to initiate dexamethasone taper when I see her back in next month   Orders Placed This Encounter  Procedures  . Kappa/lambda light chains    Standing Status:   Future    Standing Expiration Date:   11/29/2016  . Multiple Myeloma Panel (SPEP&IFE w/QIG)    Standing Status:   Future    Standing Expiration Date:   11/29/2016   All questions were answered. The patient knows to call the clinic with any problems, questions or concerns. No barriers to learning was detected. I spent 15 minutes counseling the patient face to face. The total time spent in the appointment was 20 minutes and more than 50% was on counseling and review of test results     [facility], [place],  MD 10/26/2015 11:23 AM

## 2015-10-26 NOTE — Patient Instructions (Signed)
[facility] Discharge Instructions for Patients Receiving Chemotherapy  Today you received the following chemotherapy agents: Daratumumab and Velcade.  To help prevent nausea and vomiting after your treatment, we encourage you to take your nausea medication.  Take it as often as prescribed.     If you develop nausea and vomiting that is not controlled by your nausea medication, call the clinic. If it is after clinic hours your family physician or the after hours number for the clinic or go to the Emergency Department.   BELOW ARE SYMPTOMS THAT SHOULD BE REPORTED IMMEDIATELY:  *FEVER GREATER THAN 100.5 F  *CHILLS WITH OR WITHOUT FEVER  NAUSEA AND VOMITING THAT IS NOT CONTROLLED WITH YOUR NAUSEA MEDICATION  *UNUSUAL SHORTNESS OF BREATH  *UNUSUAL BRUISING OR BLEEDING  TENDERNESS IN MOUTH AND THROAT WITH OR WITHOUT PRESENCE OF ULCERS  *URINARY PROBLEMS  *BOWEL PROBLEMS  UNUSUAL RASH Items with * indicate a potential emergency and should be followed up as soon as possible.  One of the nurses will contact you 24 hours after your treatment. Please let the nurse know about any problems that you may have experienced. Feel free to call the clinic you have any questions or concerns. The clinic phone number is [phone].   I have been informed and understand all the instructions given to me. I know to contact the clinic, my physician, or go to the Emergency Department if any problems should occur. I do not have any questions at this time, but understand that I may call the clinic during office hours   should I have any questions or need assistance in obtaining follow up care.    __________________________________________  _____________  __________ Signature of Patient or Authorized Representative            Date                   Time    __________________________________________ Nurse's Signature   Daratumumab injection What is this medicine? DARATUMUMAB (dar  a toom ue mab) is a monoclonal antibody. It is used to treat multiple myeloma. This medicine may be used for other purposes; ask your health care provider or pharmacist if you have questions. What should I tell my health care provider before I take this medicine? They need to know if you have any of these conditions: -infection (especially a virus infection such as chickenpox, cold sores, or herpes) -lung or breathing disease -pregnant or trying to get pregnant -breast-feeding -an unusual or allergic reaction to daratumumab, other medicines, foods, dyes, or preservatives How should I use this medicine? This medicine is for infusion into a vein. It is given by a health care professional in a hospital or clinic setting. Talk to your pediatrician regarding the use of this medicine in children. Special care may be needed. Overdosage: If you think you have taken too much of this medicine contact a poison control center or emergency room at once. NOTE: This medicine is only for you. Do not share this medicine with others. What if I miss a dose? Keep appointments for follow-up doses as directed. It is important not to miss your dose. Call your doctor or health care professional if you are unable to keep an appointment. What may interact with this medicine? Interactions have not been studied. Give your health care provider a list of all the medicines, herbs, non-prescription drugs, or dietary supplements you use. Also tell them if you smoke, drink alcohol, or use illegal drugs. Some  items may interact with your medicine. This list may not describe all possible interactions. Give your health care provider a list of all the medicines, herbs, non-prescription drugs, or dietary supplements you use. Also tell them if you smoke, drink alcohol, or use illegal drugs. Some items may interact with your medicine. What should I watch for while using this medicine? This drug may make you feel generally unwell.  Report any side effects. Continue your course of treatment even though you feel ill unless your doctor tells you to stop. This medicine can cause serious allergic reactions. To reduce your risk you may need to take medicine before treatment with this medicine. Take your medicine as directed. This medicine can affect the results of blood tests to match your blood type. These changes can last for up to 6 months after the final dose. Your healthcare provider will do blood tests to match your blood type before you start treatment. Tell all of your healthcare providers that you are being treated with this medicine before receiving a blood transfusion. This medicine can affect the results of some tests used to determine treatment response; extra tests may be needed to evaluate response. Do not become pregnant while taking this medicine or for 3 months after stopping it. Women should inform their doctor if they wish to become pregnant or think they might be pregnant. There is a potential for serious side effects to an unborn child. Talk to your health care professional or pharmacist for more information. What side effects may I notice from receiving this medicine? Side effects that you should report to your doctor or health care professional as soon as possible: -allergic reactions like skin rash, itching or hives, swelling of the face, lips, or tongue -breathing problems -chills -cough -dizziness -feeling faint or lightheaded -headache -nausea, vomiting -shortness of breath Side effects that usually do not require medical attention (Report these to your doctor or health care professional if they continue or are bothersome.): -back pain -fever -joint pain -loss of appetite -tiredness This list may not describe all possible side effects. Call your doctor for medical advice about side effects. You may report side effects to FDA at 1-800-FDA-1088. Where should I keep my medicine? Keep out of the reach  of children. This drug is given in a hospital or clinic and will not be stored at home. NOTE: This sheet is a summary. It may not cover all possible information. If you have questions about this medicine, talk to your doctor, pharmacist, or health care provider.    2016, [employer]. (2014-04-25 17:02:23)    Bortezomib injection (velcade) What is this medicine? BORTEZOMIB (bor TEZ oh mib) is a medicine that targets proteins in cancer cells and stops the cancer cells from growing. It is used to treat multiple myeloma and mantle-cell lymphoma. This medicine may be used for other purposes; ask your health care provider or pharmacist if you have questions. What should I tell my health care provider before I take this medicine? They need to know if you have any of these conditions: -diabetes -heart disease -irregular heartbeat -liver disease -on hemodialysis -low blood counts, like low white blood cells, platelets, or hemoglobin -peripheral neuropathy -taking medicine for blood pressure -an unusual or allergic reaction to bortezomib, mannitol, boron, other medicines, foods, dyes, or preservatives -pregnant or trying to get pregnant -breast-feeding How should I use this medicine? This medicine is for injection into a vein or for injection under the skin. It is given by a health  care professional in a hospital or clinic setting. Talk to your pediatrician regarding the use of this medicine in children. Special care may be needed. Overdosage: If you think you have taken too much of this medicine contact a poison control center or emergency room at once. NOTE: This medicine is only for you. Do not share this medicine with others. What if I miss a dose? It is important not to miss your dose. Call your doctor or health care professional if you are unable to keep an appointment. What may interact with this medicine? This medicine may interact with the following  medications: -ketoconazole -rifampin -ritonavir -St. John's Wort This list may not describe all possible interactions. Give your health care provider a list of all the medicines, herbs, non-prescription drugs, or dietary supplements you use. Also tell them if you smoke, drink alcohol, or use illegal drugs. Some items may interact with your medicine. What should I watch for while using this medicine? Visit your doctor for checks on your progress. This drug may make you feel generally unwell. This is not uncommon, as chemotherapy can affect healthy cells as well as cancer cells. Report any side effects. Continue your course of treatment even though you feel ill unless your doctor tells you to stop. You may get drowsy or dizzy. Do not drive, use machinery, or do anything that needs mental alertness until you know how this medicine affects you. Do not stand or sit up quickly, especially if you are an older patient. This reduces the risk of dizzy or fainting spells. In some cases, you may be given additional medicines to help with side effects. Follow all directions for their use. Call your doctor or health care professional for advice if you get a fever, chills or sore throat, or other symptoms of a cold or flu. Do not treat yourself. This drug decreases your body's ability to fight infections. Try to avoid being around people who are sick. This medicine may increase your risk to bruise or bleed. Call your doctor or health care professional if you notice any unusual bleeding. You may need blood work done while you are taking this medicine. In some patients, this medicine may cause a serious brain infection that may cause death. If you have any problems seeing, thinking, speaking, walking, or standing, tell your doctor right away. If you cannot reach your doctor, urgently seek other source of medical care. Do not become pregnant while taking this medicine. Women should inform their doctor if they wish to  become pregnant or think they might be pregnant. There is a potential for serious side effects to an unborn child. Talk to your health care professional or pharmacist for more information. Do not breast-feed an infant while taking this medicine. Check with your doctor or health care professional if you get an attack of severe diarrhea, nausea and vomiting, or if you sweat a lot. The loss of too much body fluid can make it dangerous for you to take this medicine. What side effects may I notice from receiving this medicine? Side effects that you should report to your doctor or health care professional as soon as possible: -allergic reactions like skin rash, itching or hives, swelling of the face, lips, or tongue -breathing problems -changes in hearing -changes in vision -fast, irregular heartbeat -feeling faint or lightheaded, falls -pain, tingling, numbness in the hands or feet -right upper belly pain -seizures -swelling of the ankles, feet, hands -unusual bleeding or bruising -unusually weak or tired -vomiting -  yellowing of the eyes or skin Side effects that usually do not require medical attention (report to your doctor or health care professional if they continue or are bothersome): -changes in emotions or moods -constipation -diarrhea -loss of appetite -headache -irritation at site where injected -nausea This list may not describe all possible side effects. Call your doctor for medical advice about side effects. You may report side effects to FDA at 1-800-FDA-1088. Where should I keep my medicine? This drug is given in a hospital or clinic and will not be stored at home. NOTE: This sheet is a summary. It may not cover all possible information. If you have questions about this medicine, talk to your doctor, pharmacist, or health care provider.    2016, [employer]. (2014-04-25 14:47:04)

## 2015-10-26 NOTE — Assessment & Plan Note (Signed)
Her back pain is drastically improved She will continue close follow-up at the pain clinic. She will continue calcium and vitamin D. I will start to initiate dexamethasone taper when I see her back in next month

## 2015-10-30 ENCOUNTER — Telehealth: Payer: Self-pay | Admitting: Hematology and Oncology

## 2015-10-30 NOTE — Telephone Encounter (Signed)
CALLED PATIENT TO CONF APPTS PER 10/26/15 LOS. L/M. APPT LTR AND SCHD MAILED. 10/30/15

## 2015-11-02 ENCOUNTER — Other Ambulatory Visit (HOSPITAL_BASED_OUTPATIENT_CLINIC_OR_DEPARTMENT_OTHER): Payer: 59

## 2015-11-02 ENCOUNTER — Ambulatory Visit (HOSPITAL_BASED_OUTPATIENT_CLINIC_OR_DEPARTMENT_OTHER): Payer: 59

## 2015-11-02 VITALS — BP 112/80 | HR 78 | Temp 98.2°F | Resp 17

## 2015-11-02 DIAGNOSIS — Z5112 Encounter for antineoplastic immunotherapy: Secondary | ICD-10-CM | POA: Diagnosis not present

## 2015-11-02 DIAGNOSIS — C9002 Multiple myeloma in relapse: Secondary | ICD-10-CM

## 2015-11-02 LAB — COMPREHENSIVE METABOLIC PANEL
ALT: 13 U/L (ref 0–55)
ANION GAP: 11 meq/L (ref 3–11)
AST: 19 U/L (ref 5–34)
Albumin: 3.9 g/dL (ref 3.5–5.0)
Alkaline Phosphatase: 65 U/L (ref 40–150)
BILIRUBIN TOTAL: 0.61 mg/dL (ref 0.20–1.20)
BUN: 21.2 mg/dL (ref 7.0–26.0)
CHLORIDE: 105 meq/L (ref 98–109)
CO2: 24 meq/L (ref 22–29)
CREATININE: 0.7 mg/dL (ref 0.6–1.1)
Calcium: 8.9 mg/dL (ref 8.4–10.4)
GLUCOSE: 89 mg/dL (ref 70–140)
Potassium: 3.9 mEq/L (ref 3.5–5.1)
SODIUM: 139 meq/L (ref 136–145)
TOTAL PROTEIN: 6.3 g/dL — AB (ref 6.4–8.3)

## 2015-11-02 LAB — CBC WITH DIFFERENTIAL/PLATELET
BASO%: 0.2 % (ref 0.0–2.0)
Basophils Absolute: 0 10*3/uL (ref 0.0–0.1)
EOS%: 0.6 % (ref 0.0–7.0)
Eosinophils Absolute: 0 10*3/uL (ref 0.0–0.5)
HCT: 37.7 % (ref 34.8–46.6)
HGB: 12.3 g/dL (ref 11.6–15.9)
LYMPH#: 1.4 10*3/uL (ref 0.9–3.3)
LYMPH%: 23.3 % (ref 14.0–49.7)
MCH: 31.5 pg (ref 25.1–34.0)
MCHC: 32.5 g/dL (ref 31.5–36.0)
MCV: 96.7 fL (ref 79.5–101.0)
MONO#: 0.5 10*3/uL (ref 0.1–0.9)
MONO%: 7.5 % (ref 0.0–14.0)
NEUT%: 68.4 % (ref 38.4–76.8)
NEUTROS ABS: 4.2 10*3/uL (ref 1.5–6.5)
PLATELETS: 104 10*3/uL — AB (ref 145–400)
RBC: 3.9 10*6/uL (ref 3.70–5.45)
RDW: 16.5 % — AB (ref 11.2–14.5)
WBC: 6.2 10*3/uL (ref 3.9–10.3)

## 2015-11-02 MED ORDER — DIPHENHYDRAMINE HCL 25 MG PO CAPS
ORAL_CAPSULE | ORAL | Status: AC
  Filled 2015-11-02: qty 2

## 2015-11-02 MED ORDER — PROCHLORPERAZINE MALEATE 10 MG PO TABS
ORAL_TABLET | ORAL | Status: AC
  Filled 2015-11-02: qty 1

## 2015-11-02 MED ORDER — METHYLPREDNISOLONE SODIUM SUCC 125 MG IJ SOLR
125.0000 mg | Freq: Once | INTRAMUSCULAR | Status: AC
  Administered 2015-11-02: 125 mg via INTRAVENOUS

## 2015-11-02 MED ORDER — BORTEZOMIB CHEMO SQ INJECTION 3.5 MG (2.5MG/ML)
1.3000 mg/m2 | Freq: Once | INTRAMUSCULAR | Status: AC
  Administered 2015-11-02: 2 mg via SUBCUTANEOUS
  Filled 2015-11-02: qty 2

## 2015-11-02 MED ORDER — DARATUMUMAB CHEMO INJECTION 400 MG/20ML
15.6000 mg/kg | Freq: Once | INTRAVENOUS | Status: AC
  Administered 2015-11-02: 900 mg via INTRAVENOUS
  Filled 2015-11-02: qty 40

## 2015-11-02 MED ORDER — HEPARIN SOD (PORK) LOCK FLUSH 100 UNIT/ML IV SOLN
500.0000 [IU] | Freq: Once | INTRAVENOUS | Status: AC | PRN
  Administered 2015-11-02: 500 [IU]
  Filled 2015-11-02: qty 5

## 2015-11-02 MED ORDER — SODIUM CHLORIDE 0.9% FLUSH
10.0000 mL | INTRAVENOUS | Status: DC | PRN
  Administered 2015-11-02: 10 mL
  Filled 2015-11-02: qty 10

## 2015-11-02 MED ORDER — ACETAMINOPHEN 325 MG PO TABS
ORAL_TABLET | ORAL | Status: AC
  Filled 2015-11-02: qty 2

## 2015-11-02 MED ORDER — ACETAMINOPHEN 325 MG PO TABS
650.0000 mg | ORAL_TABLET | Freq: Once | ORAL | Status: AC
Start: 2015-11-02 — End: 2015-11-02
  Administered 2015-11-02: 650 mg via ORAL

## 2015-11-02 MED ORDER — PROCHLORPERAZINE MALEATE 10 MG PO TABS
10.0000 mg | ORAL_TABLET | Freq: Once | ORAL | Status: AC
  Administered 2015-11-02: 10 mg via ORAL

## 2015-11-02 MED ORDER — DIPHENHYDRAMINE HCL 25 MG PO CAPS
50.0000 mg | ORAL_CAPSULE | Freq: Once | ORAL | Status: AC
  Administered 2015-11-02: 50 mg via ORAL

## 2015-11-02 MED ORDER — MONTELUKAST SODIUM 10 MG PO TABS
10.0000 mg | ORAL_TABLET | Freq: Once | ORAL | Status: AC
  Administered 2015-11-02: 10 mg via ORAL
  Filled 2015-11-02: qty 1

## 2015-11-02 MED ORDER — METHYLPREDNISOLONE SODIUM SUCC 125 MG IJ SOLR
INTRAMUSCULAR | Status: AC
  Filled 2015-11-02: qty 2

## 2015-11-02 MED ORDER — PROCHLORPERAZINE MALEATE 10 MG PO TABS: 10.0000 mg | ORAL_TABLET | Freq: Once | ORAL | Status: DC

## 2015-11-02 NOTE — Patient Instructions (Signed)
[facility] Discharge Instructions for Patients Receiving Chemotherapy  Today you received the following chemotherapy agents :  Darzalex  To help prevent nausea and vomiting after your treatment, we encourage you to take your nausea medication as prescribed.   If you develop nausea and vomiting that is not controlled by your nausea medication, call the clinic.   BELOW ARE SYMPTOMS THAT SHOULD BE REPORTED IMMEDIATELY:  *FEVER GREATER THAN 100.5 F  *CHILLS WITH OR WITHOUT FEVER  NAUSEA AND VOMITING THAT IS NOT CONTROLLED WITH YOUR NAUSEA MEDICATION  *UNUSUAL SHORTNESS OF BREATH  *UNUSUAL BRUISING OR BLEEDING  TENDERNESS IN MOUTH AND THROAT WITH OR WITHOUT PRESENCE OF ULCERS  *URINARY PROBLEMS  *BOWEL PROBLEMS  UNUSUAL RASH Items with * indicate a potential emergency and should be followed up as soon as possible.  Feel free to call the clinic you have any questions or concerns. The clinic phone number is [phone].  Please show the CHEMO ALERT CARD at check-in to the Emergency Department and triage nurse.   

## 2015-11-09 ENCOUNTER — Other Ambulatory Visit (HOSPITAL_BASED_OUTPATIENT_CLINIC_OR_DEPARTMENT_OTHER): Payer: 59

## 2015-11-09 ENCOUNTER — Ambulatory Visit (HOSPITAL_BASED_OUTPATIENT_CLINIC_OR_DEPARTMENT_OTHER): Payer: 59

## 2015-11-09 ENCOUNTER — Ambulatory Visit: Payer: 59

## 2015-11-09 VITALS — BP 114/74 | HR 72 | Temp 98.3°F | Resp 18

## 2015-11-09 DIAGNOSIS — C9002 Multiple myeloma in relapse: Secondary | ICD-10-CM | POA: Diagnosis not present

## 2015-11-09 DIAGNOSIS — C9001 Multiple myeloma in remission: Secondary | ICD-10-CM

## 2015-11-09 DIAGNOSIS — Z5112 Encounter for antineoplastic immunotherapy: Secondary | ICD-10-CM

## 2015-11-09 LAB — COMPREHENSIVE METABOLIC PANEL
ALBUMIN: 3.9 g/dL (ref 3.5–5.0)
ALK PHOS: 58 U/L (ref 40–150)
ALT: 11 U/L (ref 0–55)
ANION GAP: 10 meq/L (ref 3–11)
AST: 17 U/L (ref 5–34)
BILIRUBIN TOTAL: 0.47 mg/dL (ref 0.20–1.20)
BUN: 16.2 mg/dL (ref 7.0–26.0)
CALCIUM: 8.8 mg/dL (ref 8.4–10.4)
CO2: 25 meq/L (ref 22–29)
CREATININE: 0.8 mg/dL (ref 0.6–1.1)
Chloride: 104 mEq/L (ref 98–109)
EGFR: 87 mL/min/{1.73_m2} — ABNORMAL LOW (ref >90)
Glucose: 90 mg/dl (ref 70–140)
Potassium: 3.8 mEq/L (ref 3.5–5.1)
Sodium: 139 mEq/L (ref 136–145)
TOTAL PROTEIN: 6.1 g/dL — AB (ref 6.4–8.3)

## 2015-11-09 LAB — CBC WITH DIFFERENTIAL/PLATELET
BASO%: 0.3 % (ref 0.0–2.0)
Basophils Absolute: 0 10*3/uL (ref 0.0–0.1)
EOS ABS: 0 10*3/uL (ref 0.0–0.5)
EOS%: 0.3 % (ref 0.0–7.0)
HEMATOCRIT: 35.2 % (ref 34.8–46.6)
HEMOGLOBIN: 11.6 g/dL (ref 11.6–15.9)
LYMPH#: 1.4 10*3/uL (ref 0.9–3.3)
LYMPH%: 22 % (ref 14.0–49.7)
MCH: 31.8 pg (ref 25.1–34.0)
MCHC: 32.9 g/dL (ref 31.5–36.0)
MCV: 96.8 fL (ref 79.5–101.0)
MONO#: 0.5 10*3/uL (ref 0.1–0.9)
MONO%: 8.3 % (ref 0.0–14.0)
NEUT%: 69.1 % (ref 38.4–76.8)
NEUTROS ABS: 4.5 10*3/uL (ref 1.5–6.5)
PLATELETS: 109 10*3/uL — AB (ref 145–400)
RBC: 3.63 10*6/uL — AB (ref 3.70–5.45)
RDW: 16.7 % — AB (ref 11.2–14.5)
WBC: 6.5 10*3/uL (ref 3.9–10.3)

## 2015-11-09 MED ORDER — SODIUM CHLORIDE 0.9% FLUSH
10.0000 mL | INTRAVENOUS | Status: DC | PRN
  Administered 2015-11-09: 10 mL
  Filled 2015-11-09: qty 10

## 2015-11-09 MED ORDER — ACETAMINOPHEN 325 MG PO TABS
ORAL_TABLET | ORAL | Status: AC
  Filled 2015-11-09: qty 2

## 2015-11-09 MED ORDER — HEPARIN SOD (PORK) LOCK FLUSH 100 UNIT/ML IV SOLN
500.0000 [IU] | Freq: Once | INTRAVENOUS | Status: AC | PRN
  Administered 2015-11-09: 500 [IU]
  Filled 2015-11-09: qty 5

## 2015-11-09 MED ORDER — METHYLPREDNISOLONE SODIUM SUCC 125 MG IJ SOLR
125.0000 mg | Freq: Once | INTRAMUSCULAR | Status: AC
  Administered 2015-11-09: 125 mg via INTRAVENOUS

## 2015-11-09 MED ORDER — SODIUM CHLORIDE 0.9 % IV SOLN
Freq: Once | INTRAVENOUS | Status: AC
  Administered 2015-11-09: 10:00:00 via INTRAVENOUS

## 2015-11-09 MED ORDER — BORTEZOMIB CHEMO SQ INJECTION 3.5 MG (2.5MG/ML)
1.3000 mg/m2 | Freq: Once | INTRAMUSCULAR | Status: AC
  Administered 2015-11-09: 2 mg via SUBCUTANEOUS
  Filled 2015-11-09: qty 2

## 2015-11-09 MED ORDER — METHYLPREDNISOLONE SODIUM SUCC 125 MG IJ SOLR
INTRAMUSCULAR | Status: AC
  Filled 2015-11-09: qty 2

## 2015-11-09 MED ORDER — DIPHENHYDRAMINE HCL 25 MG PO CAPS
ORAL_CAPSULE | ORAL | Status: AC
  Filled 2015-11-09: qty 2

## 2015-11-09 MED ORDER — PROCHLORPERAZINE MALEATE 10 MG PO TABS
ORAL_TABLET | ORAL | Status: AC
  Filled 2015-11-09: qty 1

## 2015-11-09 MED ORDER — PROCHLORPERAZINE MALEATE 10 MG PO TABS: 10.0000 mg | ORAL_TABLET | Freq: Once | ORAL | Status: DC

## 2015-11-09 MED ORDER — ACETAMINOPHEN 325 MG PO TABS
650.0000 mg | ORAL_TABLET | Freq: Once | ORAL | Status: AC
  Administered 2015-11-09: 650 mg via ORAL

## 2015-11-09 MED ORDER — SODIUM CHLORIDE 0.9 % IV SOLN
15.4000 mg/kg | Freq: Once | INTRAVENOUS | Status: AC
  Administered 2015-11-09: 900 mg via INTRAVENOUS
  Filled 2015-11-09: qty 40

## 2015-11-09 MED ORDER — DIPHENHYDRAMINE HCL 25 MG PO CAPS
50.0000 mg | ORAL_CAPSULE | Freq: Once | ORAL | Status: AC
  Administered 2015-11-09: 50 mg via ORAL

## 2015-11-09 MED ORDER — PROCHLORPERAZINE MALEATE 10 MG PO TABS
10.0000 mg | ORAL_TABLET | Freq: Once | ORAL | Status: AC
  Administered 2015-11-09: 10 mg via ORAL

## 2015-11-09 MED ORDER — MONTELUKAST SODIUM 10 MG PO TABS
10.0000 mg | ORAL_TABLET | Freq: Once | ORAL | Status: AC
  Administered 2015-11-09: 10 mg via ORAL
  Filled 2015-11-09: qty 1

## 2015-11-09 NOTE — Patient Instructions (Signed)
[place] Discharge Instructions for Patients Receiving Chemotherapy  Today you received the following chemotherapy agents: Daratumumab and Velcade.  To help prevent nausea and vomiting after your treatment, we encourage you to take your nausea medication.  Take it as often as prescribed.     If you develop nausea and vomiting that is not controlled by your nausea medication, call the clinic. If it is after clinic hours your family physician or the after hours number for the clinic or go to the Emergency Department.   BELOW ARE SYMPTOMS THAT SHOULD BE REPORTED IMMEDIATELY:  *FEVER GREATER THAN 100.5 F  *CHILLS WITH OR WITHOUT FEVER  NAUSEA AND VOMITING THAT IS NOT CONTROLLED WITH YOUR NAUSEA MEDICATION  *UNUSUAL SHORTNESS OF BREATH  *UNUSUAL BRUISING OR BLEEDING  TENDERNESS IN MOUTH AND THROAT WITH OR WITHOUT PRESENCE OF ULCERS  *URINARY PROBLEMS  *BOWEL PROBLEMS  UNUSUAL RASH Items with * indicate a potential emergency and should be followed up as soon as possible.  One of the nurses will contact you 24 hours after your treatment. Please let the nurse know about any problems that you may have experienced. Feel free to call the clinic you have any questions or concerns. The clinic phone number is [phone].   I have been informed and understand all the instructions given to me. I know to contact the clinic, my physician, or go to the Emergency Department if any problems should occur. I do not have any questions at this time, but understand that I may call the clinic during office hours   should I have any questions or need assistance in obtaining follow up care.    __________________________________________  _____________  __________ Signature of Patient or Authorized Representative            Date                   Time    __________________________________________ Nurse's Signature   Daratumumab injection What is this medicine? DARATUMUMAB (dar  a toom ue mab) is a monoclonal antibody. It is used to treat multiple myeloma. This medicine may be used for other purposes; ask your health care provider or pharmacist if you have questions. What should I tell my health care provider before I take this medicine? They need to know if you have any of these conditions: -infection (especially a virus infection such as chickenpox, cold sores, or herpes) -lung or breathing disease -pregnant or trying to get pregnant -breast-feeding -an unusual or allergic reaction to daratumumab, other medicines, foods, dyes, or preservatives How should I use this medicine? This medicine is for infusion into a vein. It is given by a health care professional in a hospital or clinic setting. Talk to your pediatrician regarding the use of this medicine in children. Special care may be needed. Overdosage: If you think you have taken too much of this medicine contact a poison control center or emergency room at once. NOTE: This medicine is only for you. Do not share this medicine with others. What if I miss a dose? Keep appointments for follow-up doses as directed. It is important not to miss your dose. Call your doctor or health care professional if you are unable to keep an appointment. What may interact with this medicine? Interactions have not been studied. Give your health care provider a list of all the medicines, herbs, non-prescription drugs, or dietary supplements you use. Also tell them if you smoke, drink alcohol, or use illegal drugs. Some  items may interact with your medicine. This list may not describe all possible interactions. Give your health care provider a list of all the medicines, herbs, non-prescription drugs, or dietary supplements you use. Also tell them if you smoke, drink alcohol, or use illegal drugs. Some items may interact with your medicine. What should I watch for while using this medicine? This drug may make you feel generally unwell.  Report any side effects. Continue your course of treatment even though you feel ill unless your doctor tells you to stop. This medicine can cause serious allergic reactions. To reduce your risk you may need to take medicine before treatment with this medicine. Take your medicine as directed. This medicine can affect the results of blood tests to match your blood type. These changes can last for up to 6 months after the final dose. Your healthcare provider will do blood tests to match your blood type before you start treatment. Tell all of your healthcare providers that you are being treated with this medicine before receiving a blood transfusion. This medicine can affect the results of some tests used to determine treatment response; extra tests may be needed to evaluate response. Do not become pregnant while taking this medicine or for 3 months after stopping it. Women should inform their doctor if they wish to become pregnant or think they might be pregnant. There is a potential for serious side effects to an unborn child. Talk to your health care professional or pharmacist for more information. What side effects may I notice from receiving this medicine? Side effects that you should report to your doctor or health care professional as soon as possible: -allergic reactions like skin rash, itching or hives, swelling of the face, lips, or tongue -breathing problems -chills -cough -dizziness -feeling faint or lightheaded -headache -nausea, vomiting -shortness of breath Side effects that usually do not require medical attention (Report these to your doctor or health care professional if they continue or are bothersome.): -back pain -fever -joint pain -loss of appetite -tiredness This list may not describe all possible side effects. Call your doctor for medical advice about side effects. You may report side effects to FDA at 1-800-FDA-1088. Where should I keep my medicine? Keep out of the reach  of children. This drug is given in a hospital or clinic and will not be stored at home. NOTE: This sheet is a summary. It may not cover all possible information. If you have questions about this medicine, talk to your doctor, pharmacist, or health care provider.    2016, Elsevier/Gold Standard. (2014-04-25 17:02:23)    Bortezomib injection (velcade) What is this medicine? BORTEZOMIB (bor TEZ oh mib) is a medicine that targets proteins in cancer cells and stops the cancer cells from growing. It is used to treat multiple myeloma and mantle-cell lymphoma. This medicine may be used for other purposes; ask your health care provider or pharmacist if you have questions. What should I tell my health care provider before I take this medicine? They need to know if you have any of these conditions: -diabetes -heart disease -irregular heartbeat -liver disease -on hemodialysis -low blood counts, like low white blood cells, platelets, or hemoglobin -peripheral neuropathy -taking medicine for blood pressure -an unusual or allergic reaction to bortezomib, mannitol, boron, other medicines, foods, dyes, or preservatives -pregnant or trying to get pregnant -breast-feeding How should I use this medicine? This medicine is for injection into a vein or for injection under the skin. It is given by a health  care professional in a hospital or clinic setting. Talk to your pediatrician regarding the use of this medicine in children. Special care may be needed. Overdosage: If you think you have taken too much of this medicine contact a poison control center or emergency room at once. NOTE: This medicine is only for you. Do not share this medicine with others. What if I miss a dose? It is important not to miss your dose. Call your doctor or health care professional if you are unable to keep an appointment. What may interact with this medicine? This medicine may interact with the following  medications: -ketoconazole -rifampin -ritonavir -St. John's Wort This list may not describe all possible interactions. Give your health care provider a list of all the medicines, herbs, non-prescription drugs, or dietary supplements you use. Also tell them if you smoke, drink alcohol, or use illegal drugs. Some items may interact with your medicine. What should I watch for while using this medicine? Visit your doctor for checks on your progress. This drug may make you feel generally unwell. This is not uncommon, as chemotherapy can affect healthy cells as well as cancer cells. Report any side effects. Continue your course of treatment even though you feel ill unless your doctor tells you to stop. You may get drowsy or dizzy. Do not drive, use machinery, or do anything that needs mental alertness until you know how this medicine affects you. Do not stand or sit up quickly, especially if you are an older patient. This reduces the risk of dizzy or fainting spells. In some cases, you may be given additional medicines to help with side effects. Follow all directions for their use. Call your doctor or health care professional for advice if you get a fever, chills or sore throat, or other symptoms of a cold or flu. Do not treat yourself. This drug decreases your body's ability to fight infections. Try to avoid being around people who are sick. This medicine may increase your risk to bruise or bleed. Call your doctor or health care professional if you notice any unusual bleeding. You may need blood work done while you are taking this medicine. In some patients, this medicine may cause a serious brain infection that may cause death. If you have any problems seeing, thinking, speaking, walking, or standing, tell your doctor right away. If you cannot reach your doctor, urgently seek other source of medical care. Do not become pregnant while taking this medicine. Women should inform their doctor if they wish to  become pregnant or think they might be pregnant. There is a potential for serious side effects to an unborn child. Talk to your health care professional or pharmacist for more information. Do not breast-feed an infant while taking this medicine. Check with your doctor or health care professional if you get an attack of severe diarrhea, nausea and vomiting, or if you sweat a lot. The loss of too much body fluid can make it dangerous for you to take this medicine. What side effects may I notice from receiving this medicine? Side effects that you should report to your doctor or health care professional as soon as possible: -allergic reactions like skin rash, itching or hives, swelling of the face, lips, or tongue -breathing problems -changes in hearing -changes in vision -fast, irregular heartbeat -feeling faint or lightheaded, falls -pain, tingling, numbness in the hands or feet -right upper belly pain -seizures -swelling of the ankles, feet, hands -unusual bleeding or bruising -unusually weak or tired -vomiting -  yellowing of the eyes or skin Side effects that usually do not require medical attention (report to your doctor or health care professional if they continue or are bothersome): -changes in emotions or moods -constipation -diarrhea -loss of appetite -headache -irritation at site where injected -nausea This list may not describe all possible side effects. Call your doctor for medical advice about side effects. You may report side effects to FDA at 1-800-FDA-1088. Where should I keep my medicine? This drug is given in a hospital or clinic and will not be stored at home. NOTE: This sheet is a summary. It may not cover all possible information. If you have questions about this medicine, talk to your doctor, pharmacist, or health care provider.    2016, Elsevier/Gold Standard. (2014-04-25 14:47:04)

## 2015-11-13 LAB — KAPPA/LAMBDA LIGHT CHAINS
IG KAPPA FREE LIGHT CHAIN: 46.6 mg/L — AB (ref 3.3–19.4)
IG LAMBDA FREE LIGHT CHAIN: 2.1 mg/L — AB (ref 5.7–26.3)
KAPPA/LAMBDA FLC RATIO: 22.19 — AB (ref 0.26–1.65)

## 2015-11-16 ENCOUNTER — Ambulatory Visit (HOSPITAL_BASED_OUTPATIENT_CLINIC_OR_DEPARTMENT_OTHER): Payer: 59 | Admitting: Hematology and Oncology

## 2015-11-16 ENCOUNTER — Ambulatory Visit: Payer: 59

## 2015-11-16 ENCOUNTER — Other Ambulatory Visit (HOSPITAL_BASED_OUTPATIENT_CLINIC_OR_DEPARTMENT_OTHER): Payer: 59

## 2015-11-16 ENCOUNTER — Ambulatory Visit (HOSPITAL_BASED_OUTPATIENT_CLINIC_OR_DEPARTMENT_OTHER): Payer: 59

## 2015-11-16 VITALS — BP 109/79 | HR 80 | Temp 98.3°F | Resp 12

## 2015-11-16 DIAGNOSIS — M549 Dorsalgia, unspecified: Secondary | ICD-10-CM | POA: Diagnosis not present

## 2015-11-16 DIAGNOSIS — C9002 Multiple myeloma in relapse: Secondary | ICD-10-CM

## 2015-11-16 DIAGNOSIS — D696 Thrombocytopenia, unspecified: Secondary | ICD-10-CM

## 2015-11-16 DIAGNOSIS — L270 Generalized skin eruption due to drugs and medicaments taken internally: Secondary | ICD-10-CM

## 2015-11-16 DIAGNOSIS — Z95828 Presence of other vascular implants and grafts: Secondary | ICD-10-CM

## 2015-11-16 DIAGNOSIS — Z5112 Encounter for antineoplastic immunotherapy: Secondary | ICD-10-CM

## 2015-11-16 DIAGNOSIS — G8929 Other chronic pain: Secondary | ICD-10-CM

## 2015-11-16 LAB — CBC WITH DIFFERENTIAL/PLATELET
BASO%: 0.2 % (ref 0.0–2.0)
BASOS ABS: 0 10*3/uL (ref 0.0–0.1)
EOS%: 0.2 % (ref 0.0–7.0)
Eosinophils Absolute: 0 10*3/uL (ref 0.0–0.5)
HCT: 35.7 % (ref 34.8–46.6)
HEMOGLOBIN: 11.8 g/dL (ref 11.6–15.9)
LYMPH#: 1.6 10*3/uL (ref 0.9–3.3)
LYMPH%: 26.3 % (ref 14.0–49.7)
MCH: 32.4 pg (ref 25.1–34.0)
MCHC: 33.1 g/dL (ref 31.5–36.0)
MCV: 97.8 fL (ref 79.5–101.0)
MONO#: 0.7 10*3/uL (ref 0.1–0.9)
MONO%: 11.1 % (ref 0.0–14.0)
NEUT#: 3.8 10*3/uL (ref 1.5–6.5)
NEUT%: 62.2 % (ref 38.4–76.8)
Platelets: 132 10*3/uL — ABNORMAL LOW (ref 145–400)
RBC: 3.65 10*6/uL — ABNORMAL LOW (ref 3.70–5.45)
RDW: 17.1 % — AB (ref 11.2–14.5)
WBC: 6.2 10*3/uL (ref 3.9–10.3)

## 2015-11-16 LAB — COMPREHENSIVE METABOLIC PANEL
ALBUMIN: 4.1 g/dL (ref 3.5–5.0)
ALT: 14 U/L (ref 0–55)
AST: 22 U/L (ref 5–34)
Alkaline Phosphatase: 52 U/L (ref 40–150)
Anion Gap: 11 mEq/L (ref 3–11)
BUN: 14.7 mg/dL (ref 7.0–26.0)
CHLORIDE: 105 meq/L (ref 98–109)
CO2: 24 meq/L (ref 22–29)
Calcium: 9 mg/dL (ref 8.4–10.4)
Creatinine: 0.8 mg/dL (ref 0.6–1.1)
EGFR: 88 mL/min/{1.73_m2} — ABNORMAL LOW (ref >90)
GLUCOSE: 97 mg/dL (ref 70–140)
POTASSIUM: 3.3 meq/L — AB (ref 3.5–5.1)
SODIUM: 140 meq/L (ref 136–145)
Total Bilirubin: 0.65 mg/dL (ref 0.20–1.20)
Total Protein: 6.3 g/dL — ABNORMAL LOW (ref 6.4–8.3)

## 2015-11-16 LAB — MULTIPLE MYELOMA PANEL, SERUM
ALBUMIN SERPL ELPH-MCNC: 3.9 g/dL (ref 2.9–4.4)
ALPHA 1: 0.2 g/dL (ref 0.0–0.4)
Albumin/Glob SerPl: 2.1 — ABNORMAL HIGH (ref 0.7–1.7)
Alpha2 Glob SerPl Elph-Mcnc: 0.6 g/dL (ref 0.4–1.0)
B-Globulin SerPl Elph-Mcnc: 0.7 g/dL (ref 0.7–1.3)
Gamma Glob SerPl Elph-Mcnc: 0.4 g/dL (ref 0.4–1.8)
Globulin, Total: 1.9 g/dL — ABNORMAL LOW (ref 2.2–3.9)
IGA/IMMUNOGLOBULIN A, SERUM: 7 mg/dL — AB (ref 87–352)
IGG (IMMUNOGLOBIN G), SERUM: 368 mg/dL — AB (ref 700–1600)
IGM (IMMUNOGLOBIN M), SRM: 10 mg/dL — AB (ref 26–217)
M Protein SerPl Elph-Mcnc: 0.1 g/dL — ABNORMAL HIGH
TOTAL PROTEIN: 5.8 g/dL — AB (ref 6.0–8.5)

## 2015-11-16 MED ORDER — ACETAMINOPHEN 325 MG PO TABS
650.0000 mg | ORAL_TABLET | Freq: Once | ORAL | Status: AC
  Administered 2015-11-16: 650 mg via ORAL

## 2015-11-16 MED ORDER — SODIUM CHLORIDE 0.9 % IJ SOLN
10.0000 mL | INTRAMUSCULAR | Status: DC | PRN
  Administered 2015-11-16: 10 mL via INTRAVENOUS
  Filled 2015-11-16: qty 10

## 2015-11-16 MED ORDER — DARATUMUMAB CHEMO INJECTION 400 MG/20ML
15.6000 mg/kg | Freq: Once | INTRAVENOUS | Status: AC
  Administered 2015-11-16: 900 mg via INTRAVENOUS
  Filled 2015-11-16: qty 5

## 2015-11-16 MED ORDER — DIPHENHYDRAMINE HCL 25 MG PO CAPS
ORAL_CAPSULE | ORAL | Status: AC
  Filled 2015-11-16: qty 2

## 2015-11-16 MED ORDER — ACETAMINOPHEN 325 MG PO TABS
ORAL_TABLET | ORAL | Status: AC
  Filled 2015-11-16: qty 2

## 2015-11-16 MED ORDER — DIPHENHYDRAMINE HCL 25 MG PO CAPS
50.0000 mg | ORAL_CAPSULE | Freq: Once | ORAL | Status: AC
  Administered 2015-11-16: 50 mg via ORAL

## 2015-11-16 MED ORDER — SODIUM CHLORIDE 0.9 % IV SOLN
Freq: Once | INTRAVENOUS | Status: AC
  Administered 2015-11-16: 11:00:00 via INTRAVENOUS

## 2015-11-16 MED ORDER — PROCHLORPERAZINE MALEATE 10 MG PO TABS
ORAL_TABLET | ORAL | Status: AC
  Filled 2015-11-16: qty 1

## 2015-11-16 MED ORDER — BORTEZOMIB CHEMO SQ INJECTION 3.5 MG (2.5MG/ML)
1.3000 mg/m2 | Freq: Once | INTRAMUSCULAR | Status: AC
  Administered 2015-11-16: 2 mg via SUBCUTANEOUS
  Filled 2015-11-16: qty 2

## 2015-11-16 MED ORDER — PROCHLORPERAZINE MALEATE 10 MG PO TABS
10.0000 mg | ORAL_TABLET | Freq: Once | ORAL | Status: AC
  Administered 2015-11-16: 10 mg via ORAL

## 2015-11-16 MED ORDER — HEPARIN SOD (PORK) LOCK FLUSH 100 UNIT/ML IV SOLN
500.0000 [IU] | Freq: Once | INTRAVENOUS | Status: AC | PRN
  Administered 2015-11-16: 500 [IU]
  Filled 2015-11-16: qty 5

## 2015-11-16 MED ORDER — SODIUM CHLORIDE 0.9% FLUSH
10.0000 mL | INTRAVENOUS | Status: DC | PRN
  Administered 2015-11-16: 10 mL
  Filled 2015-11-16: qty 10

## 2015-11-16 MED ORDER — METHYLPREDNISOLONE SODIUM SUCC 125 MG IJ SOLR
INTRAMUSCULAR | Status: AC
  Filled 2015-11-16: qty 2

## 2015-11-16 MED ORDER — METHYLPREDNISOLONE SODIUM SUCC 125 MG IJ SOLR
125.0000 mg | Freq: Once | INTRAMUSCULAR | Status: AC
  Administered 2015-11-16: 125 mg via INTRAVENOUS

## 2015-11-16 NOTE — Patient Instructions (Signed)
[facility] Discharge Instructions for Patients Receiving Chemotherapy  Today you received the following chemotherapy agents Darzalex and Velcade   To help prevent nausea and vomiting after your treatment, we encourage you to take your nausea medication as directed.    If you develop nausea and vomiting that is not controlled by your nausea medication, call the clinic.   BELOW ARE SYMPTOMS THAT SHOULD BE REPORTED IMMEDIATELY:  *FEVER GREATER THAN 100.5 F  *CHILLS WITH OR WITHOUT FEVER  NAUSEA AND VOMITING THAT IS NOT CONTROLLED WITH YOUR NAUSEA MEDICATION  *UNUSUAL SHORTNESS OF BREATH  *UNUSUAL BRUISING OR BLEEDING  TENDERNESS IN MOUTH AND THROAT WITH OR WITHOUT PRESENCE OF ULCERS  *URINARY PROBLEMS  *BOWEL PROBLEMS  UNUSUAL RASH Items with * indicate a potential emergency and should be followed up as soon as possible.  Feel free to call the clinic you have any questions or concerns. The clinic phone number is [phone].  Please show the CHEMO ALERT CARD at check-in to the Emergency Department and triage nurse.   

## 2015-11-16 NOTE — Patient Instructions (Signed)

## 2015-11-17 ENCOUNTER — Encounter: Payer: Self-pay | Admitting: Hematology and Oncology

## 2015-11-17 DIAGNOSIS — L270 Generalized skin eruption due to drugs and medicaments taken internally: Secondary | ICD-10-CM | POA: Insufficient documentation

## 2015-11-17 NOTE — Assessment & Plan Note (Signed)
She has minus skin rash at prior Velcade injection site. Clinically, it does not look infected. I reassured the patient.

## 2015-11-17 NOTE — Assessment & Plan Note (Signed)
Her back pain is drastically improved She will continue close follow-up at the pain clinic. She will continue calcium and vitamin D. I will start to initiate dexamethasone taper when I see her back at the end of the month

## 2015-11-17 NOTE — Progress Notes (Signed)
[facility] OFFICE PROGRESS NOTE  Patient Care Team: [name], MD as PCP - General [name], MD as Consulting Physician (Oncology) [name], MD as Consulting Physician (Neurosurgery) [name], MD as Consulting Physician (Hematology and Oncology)  SUMMARY OF ONCOLOGIC HISTORY:   Multiple myeloma in relapse [facility])   07/21/2007 Bone Marrow Biopsy    Case #: [id number] Bone marrow showed myeloma      07/21/2007 Miscellaneous    She was diagnosed in May 2009. 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 Treatment is stopped due to minimum response and pancytopenia      10/19/2015 -  Chemotherapy    The patient had bortezomib SQ (VELCADE) chemo injection 2 mg, 1.3 mg/m2 = 2 mg, Subcutaneous,  Once, 2 of 8 cycles  for chemotherapy treatment along with weekly Daratumumab       INTERVAL HISTORY: Please see below for problem oriented charting. She is seen prior to her treatment. The patient is very distressed because she cannot be treated in a certain area of the infusion room. She tolerated treatment well apart from skin reaction from prior Velcade injections. She denies neuropathy. No recent infection. No worsening bone pain. The patient denies any recent signs or  symptoms of bleeding such as spontaneous epistaxis, hematuria or hematochezia.   REVIEW OF SYSTEMS:   Constitutional: Denies fevers, chills or abnormal weight loss Eyes: Denies blurriness of vision Ears, nose, mouth, throat, and face: Denies mucositis or sore throat Respiratory: Denies cough, dyspnea or wheezes Cardiovascular: Denies palpitation, chest discomfort or lower extremity swelling Gastrointestinal:  Denies nausea, heartburn or change in bowel habits Lymphatics: Denies new lymphadenopathy or easy bruising Neurological:Denies numbness, tingling or new weaknesses Behavioral/Psych: Mood is stable, no new changes  All other systems were reviewed with the patient and are negative.  I have reviewed the past medical history, past surgical history, social history and family history with the patient and they are unchanged from previous note.  ALLERGIES:  is allergic to hydromorphone.  MEDICATIONS:  Current Outpatient Prescriptions  Medication Sig Dispense Refill  . acyclovir (ZOVIRAX) 400 MG tablet Take 1 tablet (400 mg total) by mouth 2 (two) times daily. 60 tablet 3  . aspirin 81 MG tablet Take 81 mg by mouth.    . cholecalciferol (VITAMIN D) 1000 UNITS tablet Take 1,000 Units by mouth daily.    Marland Kitchen dexamethasone (DECADRON) 4 MG tablet Take 5 tablets (20 mg total) by mouth once a week. 60 tablet 1  . Multiple Vitamins-Minerals (ONE-A-DAY 50 PLUS PO) Take by mouth daily.    Marland Kitchen oxycodone (OXY-IR) 5 MG capsule Take 5  mg by mouth every 4 (four) hours as needed. pain    . Oxycodone HCl (OXYCONTIN) 60 MG TB12 Take 1 tablet by mouth 2 (two) times daily.    . pantoprazole (PROTONIX) 40 MG tablet TAKE (1) TABLET DAILY AS NEEDED. 30 tablet 0  . prochlorperazine (COMPAZINE) 10 MG tablet Take 1 tablet (10 mg total) by mouth every 6 (six) hours as needed (Nausea or vomiting). 30 tablet 1   No current facility-administered medications for this visit.     PHYSICAL EXAMINATION: ECOG PERFORMANCE  STATUS: 1 - Symptomatic but completely ambulatory GENERAL:alert, no distress and comfortable SKIN: Noted minus skin rash at prior injection site  EYES: normal, Conjunctiva are pink and non-injected, sclera clear OROPHARYNX:no exudate, no erythema and lips, buccal mucosa, and tongue normal  NECK: supple, thyroid normal size, non-tender, without nodularity LYMPH:  no palpable lymphadenopathy in the cervical, axillary or inguinal LUNGS: clear to auscultation and percussion with normal breathing effort HEART: regular rate & rhythm and no murmurs and no lower extremity edema ABDOMEN:abdomen soft, non-tender and normal bowel sounds Musculoskeletal:no cyanosis of digits and no clubbing  NEURO: alert & oriented x 3 with fluent speech, no focal motor/sensory deficits  LABORATORY DATA:  I have reviewed the data as listed    Component Value Date/Time   NA 140 11/16/2015 0900   K 3.3 (L) 11/16/2015 0900   CL 107 08/30/2012 1055   CO2 24 11/16/2015 0900   GLUCOSE 97 11/16/2015 0900   GLUCOSE 102 (H) 08/30/2012 1055   BUN 14.7 11/16/2015 0900   CREATININE 0.8 11/16/2015 0900   CALCIUM 9.0 11/16/2015 0900   PROT 6.3 (L) 11/16/2015 0900   ALBUMIN 4.1 11/16/2015 0900   AST 22 11/16/2015 0900   ALT 14 11/16/2015 0900   ALKPHOS 52 11/16/2015 0900   BILITOT 0.65 11/16/2015 0900    No results found for: SPEP, UPEP  Lab Results  Component Value Date   WBC 6.2 11/16/2015   NEUTROABS 3.8 11/16/2015   HGB 11.8 11/16/2015   HCT 35.7 11/16/2015   MCV 97.8 11/16/2015   PLT 132 (L) 11/16/2015      Chemistry      Component Value Date/Time   NA 140 11/16/2015 0900   K 3.3 (L) 11/16/2015 0900   CL 107 08/30/2012 1055   CO2 24 11/16/2015 0900   BUN 14.7 11/16/2015 0900   CREATININE 0.8 11/16/2015 0900      Component Value Date/Time   CALCIUM 9.0 11/16/2015 0900   ALKPHOS 52 11/16/2015 0900   AST 22 11/16/2015 0900   ALT 14 11/16/2015 0900   BILITOT 0.65 11/16/2015 0900     ASSESSMENT &  PLAN:  Multiple myeloma in relapse [facility]) Her recent myeloma panel showed excellent response to treatment. I recommend we continue the same without dosage adjustment. The patient is a little distressed due to inability to be treated in a certain area at the infusion room. I attempted to accommodate her request but due to a busy schedule, I was not able to accommodate for her needs to be treated in a certain infusion room area. Ultimately, the patient proceed with treatment without delay  Thrombocytopenia The cause is unknown. It is mild and there is little change compared from previous platelet count. The patient denies recent history of bleeding such as epistaxis, hematuria or hematochezia. She is asymptomatic from the thrombocytopenia. I will observe for now.   Chronic back pain greater than 3 months duration Her back pain is drastically improved  She will continue close follow-up at the pain clinic. She will continue calcium and vitamin D. I will start to initiate dexamethasone taper when I see her back at the end of the month  Drug-induced skin rash She has minus skin rash at prior Velcade injection site. Clinically, it does not look infected. I reassured the patient.   Orders Placed This Encounter  Procedures  . Kappa/lambda light chains    Standing Status:   Future    Standing Expiration Date:   12/20/2016  . Multiple Myeloma Panel (SPEP&IFE w/QIG)    Standing Status:   Future    Standing Expiration Date:   12/20/2016   All questions were answered. The patient knows to call the clinic with any problems, questions or concerns. No barriers to learning was detected. I spent 25 minutes counseling the patient face to face. The total time spent in the appointment was 30 minutes and more than 50% was on counseling and review of test results     [facility], Dior Dominik, MD 11/17/2015 1:57 PM

## 2015-11-17 NOTE — Assessment & Plan Note (Signed)
The cause is unknown. It is mild and there is little change compared from previous platelet count. The patient denies recent history of bleeding such as epistaxis, hematuria or hematochezia. She is asymptomatic from the thrombocytopenia. I will observe for now.  

## 2015-11-17 NOTE — Assessment & Plan Note (Signed)
Her recent myeloma panel showed excellent response to treatment. I recommend we continue the same without dosage adjustment. The patient is a little distressed due to inability to be treated in a certain area at the infusion room. I attempted to accommodate her request but due to a busy schedule, I was not able to accommodate for her needs to be treated in a certain infusion room area. Ultimately, the patient proceed with treatment without delay

## 2015-11-23 ENCOUNTER — Other Ambulatory Visit (HOSPITAL_BASED_OUTPATIENT_CLINIC_OR_DEPARTMENT_OTHER): Payer: 59

## 2015-11-23 ENCOUNTER — Ambulatory Visit (HOSPITAL_BASED_OUTPATIENT_CLINIC_OR_DEPARTMENT_OTHER): Payer: 59

## 2015-11-23 VITALS — BP 113/81 | HR 71 | Temp 98.4°F | Resp 18

## 2015-11-23 DIAGNOSIS — C9002 Multiple myeloma in relapse: Secondary | ICD-10-CM

## 2015-11-23 DIAGNOSIS — Z5112 Encounter for antineoplastic immunotherapy: Secondary | ICD-10-CM

## 2015-11-23 LAB — CBC WITH DIFFERENTIAL/PLATELET
BASO%: 0.4 % (ref 0.0–2.0)
BASOS ABS: 0 10*3/uL (ref 0.0–0.1)
EOS%: 0.2 % (ref 0.0–7.0)
Eosinophils Absolute: 0 10*3/uL (ref 0.0–0.5)
HEMATOCRIT: 36.9 % (ref 34.8–46.6)
HGB: 12.2 g/dL (ref 11.6–15.9)
LYMPH#: 1.2 10*3/uL (ref 0.9–3.3)
LYMPH%: 20.1 % (ref 14.0–49.7)
MCH: 32.7 pg (ref 25.1–34.0)
MCHC: 33.2 g/dL (ref 31.5–36.0)
MCV: 98.4 fL (ref 79.5–101.0)
MONO#: 0.5 10*3/uL (ref 0.1–0.9)
MONO%: 9.5 % (ref 0.0–14.0)
NEUT#: 4 10*3/uL (ref 1.5–6.5)
NEUT%: 69.8 % (ref 38.4–76.8)
PLATELETS: 116 10*3/uL — AB (ref 145–400)
RBC: 3.75 10*6/uL (ref 3.70–5.45)
RDW: 16.8 % — ABNORMAL HIGH (ref 11.2–14.5)
WBC: 5.8 10*3/uL (ref 3.9–10.3)

## 2015-11-23 LAB — COMPREHENSIVE METABOLIC PANEL
ALT: 14 U/L (ref 0–55)
AST: 18 U/L (ref 5–34)
Albumin: 4 g/dL (ref 3.5–5.0)
Alkaline Phosphatase: 53 U/L (ref 40–150)
Anion Gap: 11 mEq/L (ref 3–11)
BILIRUBIN TOTAL: 0.68 mg/dL (ref 0.20–1.20)
BUN: 20.4 mg/dL (ref 7.0–26.0)
CHLORIDE: 106 meq/L (ref 98–109)
CO2: 25 meq/L (ref 22–29)
Calcium: 9.1 mg/dL (ref 8.4–10.4)
Creatinine: 0.8 mg/dL (ref 0.6–1.1)
EGFR: 83 mL/min/{1.73_m2} — AB (ref >90)
GLUCOSE: 91 mg/dL (ref 70–140)
Potassium: 3.6 mEq/L (ref 3.5–5.1)
SODIUM: 141 meq/L (ref 136–145)
TOTAL PROTEIN: 6.1 g/dL — AB (ref 6.4–8.3)

## 2015-11-23 MED ORDER — DIPHENHYDRAMINE HCL 25 MG PO CAPS
ORAL_CAPSULE | ORAL | Status: AC
  Filled 2015-11-23: qty 2

## 2015-11-23 MED ORDER — DARATUMUMAB CHEMO INJECTION 400 MG/20ML
15.5000 mg/kg | Freq: Once | INTRAVENOUS | Status: AC
  Administered 2015-11-23: 900 mg via INTRAVENOUS
  Filled 2015-11-23: qty 40

## 2015-11-23 MED ORDER — METHYLPREDNISOLONE SODIUM SUCC 125 MG IJ SOLR
INTRAMUSCULAR | Status: AC
  Filled 2015-11-23: qty 2

## 2015-11-23 MED ORDER — PROCHLORPERAZINE MALEATE 10 MG PO TABS: 10.0000 mg | ORAL_TABLET | Freq: Once | ORAL | Status: DC

## 2015-11-23 MED ORDER — METHYLPREDNISOLONE SODIUM SUCC 125 MG IJ SOLR
125.0000 mg | Freq: Once | INTRAMUSCULAR | Status: AC
  Administered 2015-11-23: 125 mg via INTRAVENOUS

## 2015-11-23 MED ORDER — HEPARIN SOD (PORK) LOCK FLUSH 100 UNIT/ML IV SOLN
500.0000 [IU] | Freq: Once | INTRAVENOUS | Status: AC | PRN
  Administered 2015-11-23: 500 [IU]
  Filled 2015-11-23: qty 5

## 2015-11-23 MED ORDER — DIPHENHYDRAMINE HCL 25 MG PO CAPS
50.0000 mg | ORAL_CAPSULE | Freq: Once | ORAL | Status: AC
  Administered 2015-11-23: 50 mg via ORAL

## 2015-11-23 MED ORDER — BORTEZOMIB CHEMO SQ INJECTION 3.5 MG (2.5MG/ML)
1.3000 mg/m2 | Freq: Once | INTRAMUSCULAR | Status: AC
  Administered 2015-11-23: 2 mg via SUBCUTANEOUS
  Filled 2015-11-23: qty 2

## 2015-11-23 MED ORDER — PROCHLORPERAZINE MALEATE 10 MG PO TABS
10.0000 mg | ORAL_TABLET | Freq: Once | ORAL | Status: AC
  Administered 2015-11-23: 10 mg via ORAL

## 2015-11-23 MED ORDER — PROCHLORPERAZINE MALEATE 10 MG PO TABS
ORAL_TABLET | ORAL | Status: AC
  Filled 2015-11-23: qty 1

## 2015-11-23 MED ORDER — ACETAMINOPHEN 325 MG PO TABS
ORAL_TABLET | ORAL | Status: AC
  Filled 2015-11-23: qty 2

## 2015-11-23 MED ORDER — SODIUM CHLORIDE 0.9% FLUSH
10.0000 mL | INTRAVENOUS | Status: DC | PRN
  Administered 2015-11-23: 10 mL
  Filled 2015-11-23: qty 10

## 2015-11-23 MED ORDER — SODIUM CHLORIDE 0.9 % IV SOLN
Freq: Once | INTRAVENOUS | Status: AC
  Administered 2015-11-23: 09:00:00 via INTRAVENOUS

## 2015-11-23 MED ORDER — ACETAMINOPHEN 325 MG PO TABS
650.0000 mg | ORAL_TABLET | Freq: Once | ORAL | Status: AC
  Administered 2015-11-23: 650 mg via ORAL

## 2015-11-23 NOTE — Patient Instructions (Signed)
[facility] Discharge Instructions for Patients Receiving Chemotherapy  Today you received the following chemotherapy agents Darzalex and Velcade   To help prevent nausea and vomiting after your treatment, we encourage you to take your nausea medication as directed.    If you develop nausea and vomiting that is not controlled by your nausea medication, call the clinic.   BELOW ARE SYMPTOMS THAT SHOULD BE REPORTED IMMEDIATELY:  *FEVER GREATER THAN 100.5 F  *CHILLS WITH OR WITHOUT FEVER  NAUSEA AND VOMITING THAT IS NOT CONTROLLED WITH YOUR NAUSEA MEDICATION  *UNUSUAL SHORTNESS OF BREATH  *UNUSUAL BRUISING OR BLEEDING  TENDERNESS IN MOUTH AND THROAT WITH OR WITHOUT PRESENCE OF ULCERS  *URINARY PROBLEMS  *BOWEL PROBLEMS  UNUSUAL RASH Items with * indicate a potential emergency and should be followed up as soon as possible.  Feel free to call the clinic you have any questions or concerns. The clinic phone number is [phone].  Please show the CHEMO ALERT CARD at check-in to the Emergency Department and triage nurse.   

## 2015-11-30 ENCOUNTER — Other Ambulatory Visit (HOSPITAL_BASED_OUTPATIENT_CLINIC_OR_DEPARTMENT_OTHER): Payer: 59

## 2015-11-30 ENCOUNTER — Other Ambulatory Visit: Payer: Self-pay | Admitting: Hematology and Oncology

## 2015-11-30 ENCOUNTER — Ambulatory Visit (HOSPITAL_BASED_OUTPATIENT_CLINIC_OR_DEPARTMENT_OTHER): Payer: 59

## 2015-11-30 ENCOUNTER — Ambulatory Visit: Payer: 59

## 2015-11-30 VITALS — BP 106/72 | HR 78 | Temp 98.1°F | Resp 18

## 2015-11-30 DIAGNOSIS — C9002 Multiple myeloma in relapse: Secondary | ICD-10-CM

## 2015-11-30 DIAGNOSIS — Z5112 Encounter for antineoplastic immunotherapy: Secondary | ICD-10-CM | POA: Diagnosis not present

## 2015-11-30 DIAGNOSIS — D801 Nonfamilial hypogammaglobulinemia: Secondary | ICD-10-CM

## 2015-11-30 LAB — COMPREHENSIVE METABOLIC PANEL
ALBUMIN: 4 g/dL (ref 3.5–5.0)
ALK PHOS: 47 U/L (ref 40–150)
ALT: 13 U/L (ref 0–55)
AST: 23 U/L (ref 5–34)
Anion Gap: 11 mEq/L (ref 3–11)
BILIRUBIN TOTAL: 0.68 mg/dL (ref 0.20–1.20)
BUN: 17.9 mg/dL (ref 7.0–26.0)
CO2: 23 mEq/L (ref 22–29)
Calcium: 9 mg/dL (ref 8.4–10.4)
Chloride: 103 mEq/L (ref 98–109)
Creatinine: 0.8 mg/dL (ref 0.6–1.1)
EGFR: 87 mL/min/{1.73_m2} — ABNORMAL LOW (ref >90)
GLUCOSE: 91 mg/dL (ref 70–140)
POTASSIUM: 3.8 meq/L (ref 3.5–5.1)
SODIUM: 137 meq/L (ref 136–145)
TOTAL PROTEIN: 6.3 g/dL — AB (ref 6.4–8.3)

## 2015-11-30 LAB — CBC WITH DIFFERENTIAL/PLATELET
BASO%: 0.1 % (ref 0.0–2.0)
Basophils Absolute: 0 10*3/uL (ref 0.0–0.1)
EOS%: 0.1 % (ref 0.0–7.0)
Eosinophils Absolute: 0 10*3/uL (ref 0.0–0.5)
HCT: 35.1 % (ref 34.8–46.6)
HEMOGLOBIN: 11.8 g/dL (ref 11.6–15.9)
LYMPH%: 20.8 % (ref 14.0–49.7)
MCH: 32.9 pg (ref 25.1–34.0)
MCHC: 33.6 g/dL (ref 31.5–36.0)
MCV: 97.8 fL (ref 79.5–101.0)
MONO#: 0.6 10*3/uL (ref 0.1–0.9)
MONO%: 8.5 % (ref 0.0–14.0)
NEUT%: 70.5 % (ref 38.4–76.8)
NEUTROS ABS: 4.9 10*3/uL (ref 1.5–6.5)
Platelets: 123 10*3/uL — ABNORMAL LOW (ref 145–400)
RBC: 3.59 10*6/uL — ABNORMAL LOW (ref 3.70–5.45)
RDW: 15.6 % — AB (ref 11.2–14.5)
WBC: 7 10*3/uL (ref 3.9–10.3)
lymph#: 1.5 10*3/uL (ref 0.9–3.3)

## 2015-11-30 MED ORDER — DIPHENHYDRAMINE HCL 25 MG PO CAPS
ORAL_CAPSULE | ORAL | Status: AC
  Filled 2015-11-30: qty 2

## 2015-11-30 MED ORDER — SODIUM CHLORIDE 0.9% FLUSH
10.0000 mL | INTRAVENOUS | Status: DC | PRN
  Administered 2015-11-30: 10 mL
  Filled 2015-11-30: qty 10

## 2015-11-30 MED ORDER — PROCHLORPERAZINE MALEATE 10 MG PO TABS
ORAL_TABLET | ORAL | Status: AC
  Filled 2015-11-30: qty 1

## 2015-11-30 MED ORDER — DIPHENHYDRAMINE HCL 25 MG PO CAPS
50.0000 mg | ORAL_CAPSULE | Freq: Once | ORAL | Status: AC
  Administered 2015-11-30: 50 mg via ORAL

## 2015-11-30 MED ORDER — BORTEZOMIB CHEMO SQ INJECTION 3.5 MG (2.5MG/ML)
1.3000 mg/m2 | Freq: Once | INTRAMUSCULAR | Status: AC
  Administered 2015-11-30: 2 mg via SUBCUTANEOUS
  Filled 2015-11-30: qty 2

## 2015-11-30 MED ORDER — SODIUM CHLORIDE 0.9 % IV SOLN
INTRAVENOUS | Status: DC
  Administered 2015-11-30: 09:00:00 via INTRAVENOUS

## 2015-11-30 MED ORDER — ACETAMINOPHEN 325 MG PO TABS
650.0000 mg | ORAL_TABLET | Freq: Once | ORAL | Status: AC
  Administered 2015-11-30: 650 mg via ORAL

## 2015-11-30 MED ORDER — ACETAMINOPHEN 325 MG PO TABS
ORAL_TABLET | ORAL | Status: AC
Start: 2015-11-30 — End: 2015-11-30
  Filled 2015-11-30: qty 2

## 2015-11-30 MED ORDER — HEPARIN SOD (PORK) LOCK FLUSH 100 UNIT/ML IV SOLN
500.0000 [IU] | Freq: Once | INTRAVENOUS | Status: AC | PRN
  Administered 2015-11-30: 500 [IU]
  Filled 2015-11-30: qty 5

## 2015-11-30 MED ORDER — PROCHLORPERAZINE MALEATE 10 MG PO TABS
10.0000 mg | ORAL_TABLET | Freq: Once | ORAL | Status: AC
  Administered 2015-11-30: 10 mg via ORAL

## 2015-11-30 MED ORDER — METHYLPREDNISOLONE SODIUM SUCC 125 MG IJ SOLR
125.0000 mg | Freq: Once | INTRAMUSCULAR | Status: AC
  Administered 2015-11-30: 125 mg via INTRAVENOUS

## 2015-11-30 MED ORDER — METHYLPREDNISOLONE SODIUM SUCC 125 MG IJ SOLR
INTRAMUSCULAR | Status: AC
  Filled 2015-11-30: qty 2

## 2015-11-30 MED ORDER — SODIUM CHLORIDE 0.9 % IV SOLN
15.4000 mg/kg | Freq: Once | INTRAVENOUS | Status: AC
  Administered 2015-11-30: 900 mg via INTRAVENOUS
  Filled 2015-11-30: qty 40

## 2015-11-30 NOTE — Patient Instructions (Signed)
[facility] Discharge Instructions for Patients Receiving Chemotherapy  Today you received the following chemotherapy agents Darzalex and Velcade   To help prevent nausea and vomiting after your treatment, we encourage you to take your nausea medication as directed.    If you develop nausea and vomiting that is not controlled by your nausea medication, call the clinic.   BELOW ARE SYMPTOMS THAT SHOULD BE REPORTED IMMEDIATELY:  *FEVER GREATER THAN 100.5 F  *CHILLS WITH OR WITHOUT FEVER  NAUSEA AND VOMITING THAT IS NOT CONTROLLED WITH YOUR NAUSEA MEDICATION  *UNUSUAL SHORTNESS OF BREATH  *UNUSUAL BRUISING OR BLEEDING  TENDERNESS IN MOUTH AND THROAT WITH OR WITHOUT PRESENCE OF ULCERS  *URINARY PROBLEMS  *BOWEL PROBLEMS  UNUSUAL RASH Items with * indicate a potential emergency and should be followed up as soon as possible.  Feel free to call the clinic you have any questions or concerns. The clinic phone number is [phone].  Please show the CHEMO ALERT CARD at check-in to the Emergency Department and triage nurse.   

## 2015-12-03 ENCOUNTER — Other Ambulatory Visit: Payer: Self-pay | Admitting: Hematology and Oncology

## 2015-12-07 ENCOUNTER — Encounter: Payer: Self-pay | Admitting: Hematology and Oncology

## 2015-12-07 ENCOUNTER — Other Ambulatory Visit (HOSPITAL_BASED_OUTPATIENT_CLINIC_OR_DEPARTMENT_OTHER): Payer: 59

## 2015-12-07 ENCOUNTER — Ambulatory Visit (HOSPITAL_BASED_OUTPATIENT_CLINIC_OR_DEPARTMENT_OTHER): Payer: 59

## 2015-12-07 ENCOUNTER — Ambulatory Visit (HOSPITAL_BASED_OUTPATIENT_CLINIC_OR_DEPARTMENT_OTHER): Payer: 59 | Admitting: Hematology and Oncology

## 2015-12-07 VITALS — BP 110/83 | HR 98 | Temp 98.6°F | Resp 18

## 2015-12-07 VITALS — BP 128/95 | HR 91 | Temp 98.4°F | Resp 18

## 2015-12-07 DIAGNOSIS — Z5112 Encounter for antineoplastic immunotherapy: Secondary | ICD-10-CM

## 2015-12-07 DIAGNOSIS — D696 Thrombocytopenia, unspecified: Secondary | ICD-10-CM | POA: Diagnosis not present

## 2015-12-07 DIAGNOSIS — L270 Generalized skin eruption due to drugs and medicaments taken internally: Secondary | ICD-10-CM | POA: Diagnosis not present

## 2015-12-07 DIAGNOSIS — C9002 Multiple myeloma in relapse: Secondary | ICD-10-CM

## 2015-12-07 LAB — CBC WITH DIFFERENTIAL/PLATELET
BASO%: 0.1 % (ref 0.0–2.0)
BASOS ABS: 0 10*3/uL (ref 0.0–0.1)
EOS%: 0.1 % (ref 0.0–7.0)
Eosinophils Absolute: 0 10*3/uL (ref 0.0–0.5)
HCT: 35.4 % (ref 34.8–46.6)
HGB: 11.8 g/dL (ref 11.6–15.9)
LYMPH%: 15.7 % (ref 14.0–49.7)
MCH: 33.1 pg (ref 25.1–34.0)
MCHC: 33.3 g/dL (ref 31.5–36.0)
MCV: 99.2 fL (ref 79.5–101.0)
MONO#: 0.6 10*3/uL (ref 0.1–0.9)
MONO%: 7.5 % (ref 0.0–14.0)
NEUT#: 6.3 10*3/uL (ref 1.5–6.5)
NEUT%: 76.6 % (ref 38.4–76.8)
Platelets: 118 10*3/uL — ABNORMAL LOW (ref 145–400)
RBC: 3.57 10*6/uL — ABNORMAL LOW (ref 3.70–5.45)
RDW: 15.7 % — ABNORMAL HIGH (ref 11.2–14.5)
WBC: 8.2 10*3/uL (ref 3.9–10.3)
lymph#: 1.3 10*3/uL (ref 0.9–3.3)

## 2015-12-07 LAB — COMPREHENSIVE METABOLIC PANEL
ALT: 16 U/L (ref 0–55)
AST: 20 U/L (ref 5–34)
Albumin: 4 g/dL (ref 3.5–5.0)
Alkaline Phosphatase: 45 U/L (ref 40–150)
Anion Gap: 10 mEq/L (ref 3–11)
BUN: 20.6 mg/dL (ref 7.0–26.0)
CHLORIDE: 106 meq/L (ref 98–109)
CO2: 24 mEq/L (ref 22–29)
Calcium: 8.9 mg/dL (ref 8.4–10.4)
Creatinine: 0.8 mg/dL (ref 0.6–1.1)
EGFR: 89 mL/min/{1.73_m2} — AB (ref >90)
GLUCOSE: 90 mg/dL (ref 70–140)
POTASSIUM: 3.5 meq/L (ref 3.5–5.1)
SODIUM: 140 meq/L (ref 136–145)
Total Bilirubin: 0.87 mg/dL (ref 0.20–1.20)
Total Protein: 6.2 g/dL — ABNORMAL LOW (ref 6.4–8.3)

## 2015-12-07 MED ORDER — PROCHLORPERAZINE MALEATE 10 MG PO TABS
ORAL_TABLET | ORAL | Status: AC
  Filled 2015-12-07: qty 1

## 2015-12-07 MED ORDER — DIPHENHYDRAMINE HCL 25 MG PO CAPS
50.0000 mg | ORAL_CAPSULE | Freq: Once | ORAL | Status: AC
  Administered 2015-12-07: 50 mg via ORAL

## 2015-12-07 MED ORDER — METHYLPREDNISOLONE SODIUM SUCC 125 MG IJ SOLR
INTRAMUSCULAR | Status: AC
  Filled 2015-12-07: qty 2

## 2015-12-07 MED ORDER — BORTEZOMIB CHEMO SQ INJECTION 3.5 MG (2.5MG/ML)
1.3000 mg/m2 | Freq: Once | INTRAMUSCULAR | Status: AC
  Administered 2015-12-07: 2 mg via SUBCUTANEOUS
  Filled 2015-12-07: qty 2

## 2015-12-07 MED ORDER — DEXAMETHASONE 4 MG PO TABS: 20.0000 mg | ORAL_TABLET | ORAL | 1 refills | Status: DC

## 2015-12-07 MED ORDER — SODIUM CHLORIDE 0.9% FLUSH
10.0000 mL | INTRAVENOUS | Status: DC | PRN
  Administered 2015-12-07: 10 mL
  Filled 2015-12-07: qty 10

## 2015-12-07 MED ORDER — PROCHLORPERAZINE MALEATE 10 MG PO TABS
10.0000 mg | ORAL_TABLET | Freq: Once | ORAL | Status: AC
Start: 2015-12-07 — End: 2015-12-07
  Administered 2015-12-07: 10 mg via ORAL

## 2015-12-07 MED ORDER — DIPHENHYDRAMINE HCL 25 MG PO CAPS
ORAL_CAPSULE | ORAL | Status: AC
  Filled 2015-12-07: qty 2

## 2015-12-07 MED ORDER — PROCHLORPERAZINE MALEATE 10 MG PO TABS: 10.0000 mg | ORAL_TABLET | Freq: Once | ORAL | Status: DC

## 2015-12-07 MED ORDER — METHYLPREDNISOLONE SODIUM SUCC 125 MG IJ SOLR
125.0000 mg | Freq: Once | INTRAMUSCULAR | Status: AC
  Administered 2015-12-07: 125 mg via INTRAVENOUS

## 2015-12-07 MED ORDER — ACETAMINOPHEN 325 MG PO TABS
ORAL_TABLET | ORAL | Status: AC
  Filled 2015-12-07: qty 2

## 2015-12-07 MED ORDER — ACETAMINOPHEN 325 MG PO TABS
650.0000 mg | ORAL_TABLET | Freq: Once | ORAL | Status: AC
  Administered 2015-12-07: 650 mg via ORAL

## 2015-12-07 MED ORDER — SODIUM CHLORIDE 0.9 % IV SOLN
Freq: Once | INTRAVENOUS | Status: AC
  Administered 2015-12-07: 09:00:00 via INTRAVENOUS

## 2015-12-07 MED ORDER — HEPARIN SOD (PORK) LOCK FLUSH 100 UNIT/ML IV SOLN
500.0000 [IU] | Freq: Once | INTRAVENOUS | Status: AC | PRN
  Administered 2015-12-07: 500 [IU]
  Filled 2015-12-07: qty 5

## 2015-12-07 MED ORDER — SODIUM CHLORIDE 0.9 % IV SOLN
15.4000 mg/kg | Freq: Once | INTRAVENOUS | Status: AC
  Administered 2015-12-07: 900 mg via INTRAVENOUS
  Filled 2015-12-07: qty 40

## 2015-12-07 NOTE — Patient Instructions (Signed)
[place] [facility] Discharge Instructions for Patients Receiving Chemotherapy  Today you received the following chemotherapy agents, daratumumab  To help prevent nausea and vomiting after your treatment, we encourage you to take your nausea medication as directed.  If you develop nausea and vomiting that is not controlled by your nausea medication, call the clinic.   BELOW ARE SYMPTOMS THAT SHOULD BE REPORTED IMMEDIATELY:  *FEVER GREATER THAN 100.5 F  *CHILLS WITH OR WITHOUT FEVER  NAUSEA AND VOMITING THAT IS NOT CONTROLLED WITH YOUR NAUSEA MEDICATION  *UNUSUAL SHORTNESS OF BREATH  *UNUSUAL BRUISING OR BLEEDING  TENDERNESS IN MOUTH AND THROAT WITH OR WITHOUT PRESENCE OF ULCERS  *URINARY PROBLEMS  *BOWEL PROBLEMS  UNUSUAL RASH Items with * indicate a potential emergency and should be followed up as soon as possible.  Feel free to call the clinic you have any questions or concerns. The clinic phone number is [phone].  Please show the [place] at check-in to the Emergency Department and triage nurse.

## 2015-12-07 NOTE — Assessment & Plan Note (Signed)
She has minus skin rash at prior Velcade injection site. Clinically, it does not look infected. I reassured the patient.

## 2015-12-07 NOTE — Assessment & Plan Note (Signed)
Her recent myeloma panel showed excellent response to treatment. I recommend we continue the same without dosage adjustment. After today's dose, we will rest one week and resume in 2 weeks. I will align her Velcade treatment to be given the same day as her infusion. We discussed about her Zometa dose but the patient prefers not to have it on the same day as her other infusion treatment She will continue calcium with vitamin D and acyclovir for antimicrobial prophylaxis

## 2015-12-07 NOTE — Assessment & Plan Note (Signed)
The cause is unknown. It is mild and there is little change compared from previous platelet count. The patient denies recent history of bleeding such as epistaxis, hematuria or hematochezia. She is asymptomatic from the thrombocytopenia. I will observe for now.  

## 2015-12-07 NOTE — Progress Notes (Signed)
[place] OFFICE PROGRESS NOTE  Patient Care Team: [name], MD as PCP - General [name], MD as Consulting Physician (Oncology) [name], MD as Consulting Physician (Neurosurgery) [name], MD as Consulting Physician (Hematology and Oncology)  SUMMARY OF ONCOLOGIC HISTORY:   Multiple myeloma in relapse [facility])   07/21/2007 Bone Marrow Biopsy    Case #: [id number] Bone marrow showed myeloma      07/21/2007 Miscellaneous    She was diagnosed in May 2009. 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 Treatment is stopped due to minimum response and pancytopenia      10/19/2015 -  Chemotherapy    The patient had bortezomib SQ (VELCADE) chemo injection 2 mg, 1.3 mg/m2 = 2 mg, Subcutaneous,  Once, 2 of 8 cycles  for chemotherapy treatment along with weekly Daratumumab       INTERVAL HISTORY: Please see below for problem oriented charting. She is seen in the treatment room. She feels well. She has very minor skin reaction at injection sites. Denies recent infection. The patient denies any recent signs or symptoms of bleeding such as spontaneous epistaxis, hematuria or hematochezia.  REVIEW OF SYSTEMS:   Constitutional: Denies fevers, chills or abnormal weight  loss Eyes: Denies blurriness of vision Ears, nose, mouth, throat, and face: Denies mucositis or sore throat Respiratory: Denies cough, dyspnea or wheezes Cardiovascular: Denies palpitation, chest discomfort or lower extremity swelling Gastrointestinal:  Denies nausea, heartburn or change in bowel habits Lymphatics: Denies new lymphadenopathy or easy bruising Neurological:Denies numbness, tingling or new weaknesses Behavioral/Psych: Mood is stable, no new changes  All other systems were reviewed with the patient and are negative.  I have reviewed the past medical history, past surgical history, social history and family history with the patient and they are unchanged from previous note.  ALLERGIES:  is allergic to hydromorphone.  MEDICATIONS:  Current Outpatient Prescriptions  Medication Sig Dispense Refill  . acyclovir (ZOVIRAX) 400 MG tablet Take 1 tablet (400 mg total) by mouth 2 (two) times daily. 60 tablet 3  . aspirin 81 MG tablet Take 81 mg by mouth.    . cholecalciferol (VITAMIN D) 1000 UNITS tablet Take 1,000 Units by mouth daily.    Marland Kitchen dexamethasone (DECADRON) 4 MG tablet Take 5 tablets (20 mg total) by mouth once a week. 60 tablet 1  . Multiple Vitamins-Minerals (ONE-A-DAY 50 PLUS PO) Take by mouth daily.    Marland Kitchen oxycodone (OXY-IR) 5 MG capsule Take 5 mg by mouth every 4 (four) hours as needed. pain    . Oxycodone HCl (OXYCONTIN) 60 MG TB12 Take 1 tablet by mouth 2 (  two) times daily.    . pantoprazole (PROTONIX) 40 MG tablet TAKE (1) TABLET DAILY AS NEEDED. 30 tablet 0  . prochlorperazine (COMPAZINE) 10 MG tablet Take 1 tablet (10 mg total) by mouth every 6 (six) hours as needed (Nausea or vomiting). 30 tablet 1   No current facility-administered medications for this visit.    Facility-Administered Medications Ordered in Other Visits  Medication Dose Route Frequency Provider Last Rate Last Dose  . heparin lock flush 100 unit/mL  500 Units Intracatheter Once PRN [name], MD       . prochlorperazine (COMPAZINE) tablet 10 mg  10 mg Oral Once [name], MD      . sodium chloride flush (NS) 0.9 % injection 10 mL  10 mL Intracatheter PRN [name], MD        PHYSICAL EXAMINATION: ECOG PERFORMANCE STATUS: 1 - Symptomatic but completely ambulatory  Vitals:   12/07/15 1003  BP: (!) 128/95  Pulse: 91  Resp: 18  Temp: 98.4 F (36.9 C)   There were no vitals filed for this visit.  GENERAL:alert, no distress and comfortable SKIN: Noted mild skin reaction at the site of injection. No evidence of cellulitis EYES: normal, Conjunctiva are pink and non-injected, sclera clear OROPHARYNX:no exudate, no erythema and lips, buccal mucosa, and tongue normal  NECK: supple, thyroid normal size, non-tender, without nodularity LYMPH:  no palpable lymphadenopathy in the cervical, axillary or inguinal LUNGS: clear to auscultation and percussion with normal breathing effort HEART: regular rate & rhythm and no murmurs and no lower extremity edema ABDOMEN:abdomen soft, non-tender and normal bowel sounds Musculoskeletal:no cyanosis of digits and no clubbing  NEURO: alert & oriented x 3 with fluent speech, no focal motor/sensory deficits  LABORATORY DATA:  I have reviewed the data as listed    Component Value Date/Time   NA 140 12/07/2015 0820   K 3.5 12/07/2015 0820   CL 107 08/30/2012 1055   CO2 24 12/07/2015 0820   GLUCOSE 90 12/07/2015 0820   GLUCOSE 102 (H) 08/30/2012 1055   BUN 20.6 12/07/2015 0820   CREATININE 0.8 12/07/2015 0820   CALCIUM 8.9 12/07/2015 0820   PROT 6.2 (L) 12/07/2015 0820   ALBUMIN 4.0 12/07/2015 0820   AST 20 12/07/2015 0820   ALT 16 12/07/2015 0820   ALKPHOS 45 12/07/2015 0820   BILITOT 0.87 12/07/2015 0820    No results found for: SPEP, UPEP  Lab Results  Component Value Date   WBC 8.2 12/07/2015   NEUTROABS 6.3 12/07/2015   HGB 11.8 12/07/2015   HCT 35.4 12/07/2015   MCV 99.2 12/07/2015   PLT 118 (L) 12/07/2015      Chemistry       Component Value Date/Time   NA 140 12/07/2015 0820   K 3.5 12/07/2015 0820   CL 107 08/30/2012 1055   CO2 24 12/07/2015 0820   BUN 20.6 12/07/2015 0820   CREATININE 0.8 12/07/2015 0820      Component Value Date/Time   CALCIUM 8.9 12/07/2015 0820   ALKPHOS 45 12/07/2015 0820   AST 20 12/07/2015 0820   ALT 16 12/07/2015 0820   BILITOT 0.87 12/07/2015 0820      ASSESSMENT & PLAN:  Multiple myeloma in relapse (HCC) Her recent myeloma panel showed excellent response to treatment. I recommend we continue the same without dosage adjustment. After today's dose, we will rest one week and resume in 2 weeks. I will align her Velcade treatment to be given the same day as her  infusion. We discussed about her Zometa dose but the patient prefers not to have it on the same day as her other infusion treatment She will continue calcium with vitamin D and acyclovir for antimicrobial prophylaxis  Thrombocytopenia The cause is unknown. It is mild and there is little change compared from previous platelet count. The patient denies recent history of bleeding such as epistaxis, hematuria or hematochezia. She is asymptomatic from the thrombocytopenia. I will observe for now.   Drug-induced skin rash She has minus skin rash at prior Velcade injection site. Clinically, it does not look infected. I reassured the patient.   No orders of the defined types were placed in this encounter.  All questions were answered. The patient knows to call the clinic with any problems, questions or concerns. No barriers to learning was detected. I spent 15 minutes counseling the patient face to face. The total time spent in the appointment was 20 minutes and more than 50% was on counseling and review of test results     [name], MD 12/07/2015 1:09 PM

## 2015-12-10 ENCOUNTER — Telehealth: Payer: Self-pay | Admitting: *Deleted

## 2015-12-10 ENCOUNTER — Encounter: Payer: Self-pay | Admitting: Hematology and Oncology

## 2015-12-10 LAB — KAPPA/LAMBDA LIGHT CHAINS
IG KAPPA FREE LIGHT CHAIN: 44 mg/L — AB (ref 3.3–19.4)
Ig Lambda Free Light Chain: 2.2 mg/L — ABNORMAL LOW (ref 5.7–26.3)
Kappa/Lambda FluidC Ratio: 20 — ABNORMAL HIGH (ref 0.26–1.65)

## 2015-12-10 NOTE — Telephone Encounter (Signed)
Per desk RN and patient request I have moved appts on 10/13 to earlier in the day. Patient given new appt times via lmom

## 2015-12-10 NOTE — Progress Notes (Signed)
Called patient to get permission to apply to LLS on her behalf for co-pay assistance w/ Darzalex. Left a voicemail with my contact name and number for her to return my call.

## 2015-12-11 LAB — MULTIPLE MYELOMA PANEL, SERUM
ALBUMIN/GLOB SERPL: 2 — AB (ref 0.7–1.7)
ALPHA 1: 0.2 g/dL (ref 0.0–0.4)
Albumin SerPl Elph-Mcnc: 3.9 g/dL (ref 2.9–4.4)
Alpha2 Glob SerPl Elph-Mcnc: 0.6 g/dL (ref 0.4–1.0)
B-Globulin SerPl Elph-Mcnc: 0.8 g/dL (ref 0.7–1.3)
GAMMA GLOB SERPL ELPH-MCNC: 0.3 g/dL — AB (ref 0.4–1.8)
GLOBULIN, TOTAL: 2 g/dL — AB (ref 2.2–3.9)
IGA/IMMUNOGLOBULIN A, SERUM: 6 mg/dL — AB (ref 87–352)
IGM (IMMUNOGLOBIN M), SRM: 9 mg/dL — AB (ref 26–217)
IgG, Qn, Serum: 395 mg/dL — ABNORMAL LOW (ref 700–1600)
M Protein SerPl Elph-Mcnc: 0.1 g/dL — ABNORMAL HIGH
Total Protein: 5.9 g/dL — ABNORMAL LOW (ref 6.0–8.5)

## 2015-12-14 ENCOUNTER — Telehealth: Payer: Self-pay | Admitting: Hematology and Oncology

## 2015-12-14 NOTE — Telephone Encounter (Signed)
LEFT MESSAGE RE 10/13 APPOINTMENTS

## 2015-12-21 ENCOUNTER — Other Ambulatory Visit (HOSPITAL_BASED_OUTPATIENT_CLINIC_OR_DEPARTMENT_OTHER): Payer: 59

## 2015-12-21 ENCOUNTER — Ambulatory Visit (HOSPITAL_BASED_OUTPATIENT_CLINIC_OR_DEPARTMENT_OTHER): Payer: 59

## 2015-12-21 ENCOUNTER — Encounter: Payer: Self-pay | Admitting: Hematology and Oncology

## 2015-12-21 ENCOUNTER — Ambulatory Visit (HOSPITAL_BASED_OUTPATIENT_CLINIC_OR_DEPARTMENT_OTHER): Payer: 59 | Admitting: Hematology and Oncology

## 2015-12-21 ENCOUNTER — Telehealth: Payer: Self-pay | Admitting: Hematology and Oncology

## 2015-12-21 VITALS — BP 114/74 | HR 95 | Temp 99.1°F | Resp 18

## 2015-12-21 VITALS — BP 120/80 | HR 100 | Temp 98.6°F | Resp 20

## 2015-12-21 DIAGNOSIS — C9001 Multiple myeloma in remission: Secondary | ICD-10-CM | POA: Diagnosis not present

## 2015-12-21 DIAGNOSIS — Z5112 Encounter for antineoplastic immunotherapy: Secondary | ICD-10-CM

## 2015-12-21 DIAGNOSIS — C9002 Multiple myeloma in relapse: Secondary | ICD-10-CM

## 2015-12-21 DIAGNOSIS — D696 Thrombocytopenia, unspecified: Secondary | ICD-10-CM | POA: Diagnosis not present

## 2015-12-21 DIAGNOSIS — M549 Dorsalgia, unspecified: Secondary | ICD-10-CM | POA: Diagnosis not present

## 2015-12-21 DIAGNOSIS — G8929 Other chronic pain: Secondary | ICD-10-CM

## 2015-12-21 LAB — CBC WITH DIFFERENTIAL/PLATELET
BASO%: 0.7 % (ref 0.0–2.0)
Basophils Absolute: 0 10*3/uL (ref 0.0–0.1)
EOS%: 0.4 % (ref 0.0–7.0)
Eosinophils Absolute: 0 10*3/uL (ref 0.0–0.5)
HCT: 36.5 % (ref 34.8–46.6)
HGB: 12 g/dL (ref 11.6–15.9)
LYMPH#: 1.4 10*3/uL (ref 0.9–3.3)
LYMPH%: 26.6 % (ref 14.0–49.7)
MCH: 33.3 pg (ref 25.1–34.0)
MCHC: 33 g/dL (ref 31.5–36.0)
MCV: 100.8 fL (ref 79.5–101.0)
MONO#: 0.5 10*3/uL (ref 0.1–0.9)
MONO%: 9.9 % (ref 0.0–14.0)
NEUT%: 62.4 % (ref 38.4–76.8)
NEUTROS ABS: 3.4 10*3/uL (ref 1.5–6.5)
PLATELETS: 153 10*3/uL (ref 145–400)
RBC: 3.62 10*6/uL — AB (ref 3.70–5.45)
RDW: 15.7 % — ABNORMAL HIGH (ref 11.2–14.5)
WBC: 5.4 10*3/uL (ref 3.9–10.3)

## 2015-12-21 LAB — COMPREHENSIVE METABOLIC PANEL
ALT: 14 U/L (ref 0–55)
ANION GAP: 11 meq/L (ref 3–11)
AST: 20 U/L (ref 5–34)
Albumin: 3.9 g/dL (ref 3.5–5.0)
Alkaline Phosphatase: 44 U/L (ref 40–150)
BILIRUBIN TOTAL: 0.57 mg/dL (ref 0.20–1.20)
BUN: 21.4 mg/dL (ref 7.0–26.0)
CO2: 24 meq/L (ref 22–29)
CREATININE: 0.8 mg/dL (ref 0.6–1.1)
Calcium: 8.6 mg/dL (ref 8.4–10.4)
Chloride: 104 mEq/L (ref 98–109)
EGFR: 87 mL/min/{1.73_m2} — ABNORMAL LOW (ref >90)
GLUCOSE: 82 mg/dL (ref 70–140)
Potassium: 3.6 mEq/L (ref 3.5–5.1)
Sodium: 139 mEq/L (ref 136–145)
TOTAL PROTEIN: 6.2 g/dL — AB (ref 6.4–8.3)

## 2015-12-21 MED ORDER — PROCHLORPERAZINE MALEATE 10 MG PO TABS: 10.0000 mg | ORAL_TABLET | Freq: Once | ORAL | Status: DC

## 2015-12-21 MED ORDER — DIPHENHYDRAMINE HCL 25 MG PO CAPS
50.0000 mg | ORAL_CAPSULE | Freq: Once | ORAL | Status: AC
  Administered 2015-12-21: 50 mg via ORAL

## 2015-12-21 MED ORDER — HEPARIN SOD (PORK) LOCK FLUSH 100 UNIT/ML IV SOLN
500.0000 [IU] | Freq: Once | INTRAVENOUS | Status: AC | PRN
  Administered 2015-12-21: 500 [IU]
  Filled 2015-12-21: qty 5

## 2015-12-21 MED ORDER — SODIUM CHLORIDE 0.9 % IV SOLN
15.6000 mg/kg | Freq: Once | INTRAVENOUS | Status: AC
  Administered 2015-12-21: 900 mg via INTRAVENOUS
  Filled 2015-12-21: qty 40

## 2015-12-21 MED ORDER — ACETAMINOPHEN 325 MG PO TABS
650.0000 mg | ORAL_TABLET | Freq: Once | ORAL | Status: AC
Start: 2015-12-21 — End: 2015-12-21
  Administered 2015-12-21: 650 mg via ORAL

## 2015-12-21 MED ORDER — ACETAMINOPHEN 325 MG PO TABS
ORAL_TABLET | ORAL | Status: AC
  Filled 2015-12-21: qty 2

## 2015-12-21 MED ORDER — METHYLPREDNISOLONE SODIUM SUCC 125 MG IJ SOLR
125.0000 mg | Freq: Once | INTRAMUSCULAR | Status: AC
  Administered 2015-12-21: 125 mg via INTRAVENOUS

## 2015-12-21 MED ORDER — SODIUM CHLORIDE 0.9 % IV SOLN
Freq: Once | INTRAVENOUS | Status: AC
  Administered 2015-12-21: 10:00:00 via INTRAVENOUS

## 2015-12-21 MED ORDER — DIPHENHYDRAMINE HCL 25 MG PO CAPS
ORAL_CAPSULE | ORAL | Status: AC
  Filled 2015-12-21: qty 2

## 2015-12-21 MED ORDER — METHYLPREDNISOLONE SODIUM SUCC 125 MG IJ SOLR
INTRAMUSCULAR | Status: AC
  Filled 2015-12-21: qty 2

## 2015-12-21 MED ORDER — BORTEZOMIB CHEMO SQ INJECTION 3.5 MG (2.5MG/ML)
1.3000 mg/m2 | Freq: Once | INTRAMUSCULAR | Status: AC
  Administered 2015-12-21: 2 mg via SUBCUTANEOUS
  Filled 2015-12-21: qty 2

## 2015-12-21 MED ORDER — SODIUM CHLORIDE 0.9% FLUSH
10.0000 mL | INTRAVENOUS | Status: DC | PRN
  Administered 2015-12-21: 10 mL
  Filled 2015-12-21: qty 10

## 2015-12-21 MED ORDER — PROCHLORPERAZINE MALEATE 10 MG PO TABS
ORAL_TABLET | ORAL | Status: AC
  Filled 2015-12-21: qty 1

## 2015-12-21 MED ORDER — PROCHLORPERAZINE MALEATE 10 MG PO TABS
10.0000 mg | ORAL_TABLET | Freq: Once | ORAL | Status: AC
  Administered 2015-12-21: 10 mg via ORAL

## 2015-12-21 NOTE — Patient Instructions (Addendum)
[place] [facility] Discharge Instructions for Patients Receiving Chemotherapy  Today you received the following chemotherapy agents Velcade/Darzalex  To help prevent nausea and vomiting after your treatment, we encourage you to take your nausea medication    If you develop nausea and vomiting that is not controlled by your nausea medication, call the clinic.   BELOW ARE SYMPTOMS THAT SHOULD BE REPORTED IMMEDIATELY:  *FEVER GREATER THAN 100.5 F  *CHILLS WITH OR WITHOUT FEVER  NAUSEA AND VOMITING THAT IS NOT CONTROLLED WITH YOUR NAUSEA MEDICATION  *UNUSUAL SHORTNESS OF BREATH  *UNUSUAL BRUISING OR BLEEDING  TENDERNESS IN MOUTH AND THROAT WITH OR WITHOUT PRESENCE OF ULCERS  *URINARY PROBLEMS  *BOWEL PROBLEMS  UNUSUAL RASH Items with * indicate a potential emergency and should be followed up as soon as possible.  Feel free to call the clinic you have any questions or concerns. The clinic phone number is [phone].  Please show the [place] at check-in to the Emergency Department and triage nurse.  [place] Discharge Instructions for Patients Receiving Chemotherapy  Today you received the following chemotherapy agents, daratumumab  To help prevent nausea and vomiting after your treatment, we encourage you to take your nausea medication as directed.  If you develop nausea and vomiting that is not controlled by your nausea medication, call the clinic.   BELOW ARE SYMPTOMS THAT SHOULD BE REPORTED IMMEDIATELY:  *FEVER GREATER THAN 100.5 F  *CHILLS WITH OR WITHOUT FEVER  NAUSEA AND VOMITING THAT IS NOT CONTROLLED WITH YOUR NAUSEA MEDICATION  *UNUSUAL SHORTNESS OF BREATH  *UNUSUAL BRUISING OR BLEEDING  TENDERNESS IN MOUTH AND THROAT WITH OR WITHOUT PRESENCE OF ULCERS  *URINARY PROBLEMS  *BOWEL PROBLEMS  UNUSUAL RASH Items with * indicate a potential emergency and should be followed up as soon as possible.  Feel free to call the  clinic you have any questions or concerns. The clinic phone number is [phone].  Please show the [place] at check-in to the Emergency Department and triage nurse.

## 2015-12-21 NOTE — Assessment & Plan Note (Signed)
Her back pain is drastically improved She will continue close follow-up at the pain clinic. She will continue calcium and vitamin D. I will start to initiate dexamethasone taper

## 2015-12-21 NOTE — Telephone Encounter (Signed)
Per NG she only needs to see patient on 11/10.

## 2015-12-21 NOTE — Assessment & Plan Note (Signed)
The cause is unknown. It is mild and there is little change compared from previous platelet count. The patient denies recent history of bleeding such as epistaxis, hematuria or hematochezia. She is asymptomatic from the thrombocytopenia. I will observe for now.  

## 2015-12-21 NOTE — Telephone Encounter (Signed)
Gave spouse avs report and appointments for October and November. Per patient zometa scheduled for 10/19 instead of 10/20. Per infusion scheduler all infusions to be scheduled in H group at 8:15 am w/lab at 8 am for tubes and blood to be drawn in infusion area.

## 2015-12-21 NOTE — Assessment & Plan Note (Signed)
I reviewed recent myeloma panel with the patient and her husband. Her recent blood work suggested VGPR I explained to the patient and her husband the rationale behind modification of dosing.  Moving forward, she will come here every other week to get infusional treatment along with subcutaneous Velcade The patient prefers not to have Zometa on the same day as her treatment and I will reschedule that to next week She will continue calcium with vitamin D and acyclovir for antimicrobial prophylaxis I recommend dexamethasone taper over the next few weeks

## 2015-12-21 NOTE — Progress Notes (Signed)
[facility] OFFICE PROGRESS NOTE  Patient Care Team: [name], MD as PCP - General [name], MD as Consulting Physician (Oncology) [name], MD as Consulting Physician (Neurosurgery) [name], MD as Consulting Physician (Hematology and Oncology)  SUMMARY OF ONCOLOGIC HISTORY:   Multiple myeloma in remission [facility])   07/21/2007 Bone Marrow Biopsy    Case #: [id number] Bone marrow showed myeloma      07/21/2007 Miscellaneous    She was diagnosed in May 2009. 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 Treatment is stopped due to minimum response and pancytopenia      10/19/2015 -  Chemotherapy    The patient had bortezomib SQ (VELCADE) chemo injection 2 mg, 1.3 mg/m2 = 2 mg, Subcutaneous,  Once, 2 of 8 cycles  for chemotherapy treatment along with weekly Daratumumab       INTERVAL HISTORY: Please see below for problem oriented charting. She is seen in the treatment room. She tolerated treatment well. Denies significant skin rash on neuropathy with Velcade. No recent infection. Her back pain remains stable. The patient denies any recent signs or symptoms of bleeding such as spontaneous epistaxis, hematuria or hematochezia.   REVIEW OF SYSTEMS:    Constitutional: Denies fevers, chills or abnormal weight loss Eyes: Denies blurriness of vision Ears, nose, mouth, throat, and face: Denies mucositis or sore throat Respiratory: Denies cough, dyspnea or wheezes Cardiovascular: Denies palpitation, chest discomfort or lower extremity swelling Gastrointestinal:  Denies nausea, heartburn or change in bowel habits Skin: Denies abnormal skin rashes Lymphatics: Denies new lymphadenopathy or easy bruising Neurological:Denies numbness, tingling or new weaknesses Behavioral/Psych: Mood is stable, no new changes  All other systems were reviewed with the patient and are negative.  I have reviewed the past medical history, past surgical history, social history and family history with the patient and they are unchanged from previous note.  ALLERGIES:  is allergic to hydromorphone.  MEDICATIONS:  Current Outpatient Prescriptions  Medication Sig Dispense Refill  . acyclovir (ZOVIRAX) 400 MG tablet Take 1 tablet (400 mg total) by mouth 2 (two) times daily. 60 tablet 3  . aspirin 81 MG tablet Take 81 mg by mouth.    . cholecalciferol (VITAMIN D) 1000 UNITS tablet Take 1,000 Units by mouth daily.    Marland Kitchen dexamethasone (DECADRON) 4 MG tablet Take 5 tablets (20 mg total) by mouth once a week. 60 tablet 1  . Multiple Vitamins-Minerals (ONE-A-DAY 50 PLUS PO) Take by mouth daily.    Marland Kitchen oxycodone (OXY-IR) 5 MG capsule Take 5 mg by mouth every 4 (four) hours as needed. pain    . Oxycodone  HCl (OXYCONTIN) 60 MG TB12 Take 1 tablet by mouth 2 (two) times daily.    . pantoprazole (PROTONIX) 40 MG tablet TAKE (1) TABLET DAILY AS NEEDED. 30 tablet 0  . prochlorperazine (COMPAZINE) 10 MG tablet Take 1 tablet (10 mg total) by mouth every 6 (six) hours as needed (Nausea or vomiting). 30 tablet 1   No current facility-administered medications for this visit.    Facility-Administered Medications Ordered in Other Visits  Medication Dose Route Frequency Provider Last Rate  Last Dose  . heparin lock flush 100 unit/mL  500 Units Intracatheter Once PRN [name], MD      . sodium chloride flush (NS) 0.9 % injection 10 mL  10 mL Intracatheter PRN [name], MD        PHYSICAL EXAMINATION: ECOG PERFORMANCE STATUS: 1 - Symptomatic but completely ambulatory  Vitals:   12/21/15 1031  BP: 120/80  Pulse: 100  Resp: 20  Temp: 98.6 F (37 C)   There were no vitals filed for this visit.  GENERAL:alert, no distress and comfortable SKIN: skin color, texture, turgor are normal, no rashes or significant lesions EYES: normal, Conjunctiva are pink and non-injected, sclera clear OROPHARYNX:no exudate, no erythema and lips, buccal mucosa, and tongue normal  NECK: supple, thyroid normal size, non-tender, without nodularity LYMPH:  no palpable lymphadenopathy in the cervical, axillary or inguinal LUNGS: clear to auscultation and percussion with normal breathing effort HEART: regular rate & rhythm and no murmurs and no lower extremity edema ABDOMEN:abdomen soft, non-tender and normal bowel sounds Musculoskeletal:no cyanosis of digits and no clubbing  NEURO: alert & oriented x 3 with fluent speech, no focal motor/sensory deficits  LABORATORY DATA:  I have reviewed the data as listed    Component Value Date/Time   NA 139 12/21/2015 0838   K 3.6 12/21/2015 0838   CL 107 08/30/2012 1055   CO2 24 12/21/2015 0838   GLUCOSE 82 12/21/2015 0838   GLUCOSE 102 (H) 08/30/2012 1055   BUN 21.4 12/21/2015 0838   CREATININE 0.8 12/21/2015 0838   CALCIUM 8.6 12/21/2015 0838   PROT 6.2 (L) 12/21/2015 0838   ALBUMIN 3.9 12/21/2015 0838   AST 20 12/21/2015 0838   ALT 14 12/21/2015 0838   ALKPHOS 44 12/21/2015 0838   BILITOT 0.57 12/21/2015 0838    No results found for: SPEP, UPEP  Lab Results  Component Value Date   WBC 5.4 12/21/2015   NEUTROABS 3.4 12/21/2015   HGB 12.0 12/21/2015   HCT 36.5 12/21/2015   MCV 100.8 12/21/2015   PLT 153 12/21/2015      Chemistry       Component Value Date/Time   NA 139 12/21/2015 0838   K 3.6 12/21/2015 0838   CL 107 08/30/2012 1055   CO2 24 12/21/2015 0838   BUN 21.4 12/21/2015 0838   CREATININE 0.8 12/21/2015 0838      Component Value Date/Time   CALCIUM 8.6 12/21/2015 0838   ALKPHOS 44 12/21/2015 0838   AST 20 12/21/2015 0838   ALT 14 12/21/2015 0838   BILITOT 0.57 12/21/2015 0838     ASSESSMENT & PLAN:  Multiple myeloma in remission ([place]) I reviewed recent myeloma panel with the patient and her husband. Her recent blood work suggested VGPR I explained to the patient and her husband the rationale behind modification of dosing.  Moving forward, she will come here every other week to get infusional treatment along with subcutaneous Velcade The patient prefers not to have Zometa on the  same day as her treatment and I will reschedule that to next week She will continue calcium with vitamin D and acyclovir for antimicrobial prophylaxis I recommend dexamethasone taper over the next few weeks  Thrombocytopenia The cause is unknown. It is mild and there is little change compared from previous platelet count. The patient denies recent history of bleeding such as epistaxis, hematuria or hematochezia. She is asymptomatic from the thrombocytopenia. I will observe for now.   Chronic back pain greater than 3 months duration Her back pain is drastically improved She will continue close follow-up at the pain clinic. She will continue calcium and vitamin D. I will start to initiate dexamethasone taper    Orders Placed This Encounter  Procedures  . Kappa/lambda light chains    Standing Status:   Future    Standing Expiration Date:   01/24/2017  . Multiple Myeloma Panel (SPEP&IFE w/QIG)    Standing Status:   Future    Standing Expiration Date:   01/24/2017   All questions were answered. The patient knows to call the clinic with any problems, questions or concerns. No barriers to learning was detected. I spent  20 minutes counseling the patient face to face. The total time spent in the appointment was 25 minutes and more than 50% was on counseling and review of test results     [name], MD 12/21/2015 2:37 PM

## 2015-12-27 ENCOUNTER — Ambulatory Visit (HOSPITAL_BASED_OUTPATIENT_CLINIC_OR_DEPARTMENT_OTHER): Payer: 59

## 2015-12-27 VITALS — BP 130/86 | HR 84 | Temp 98.4°F | Resp 17

## 2015-12-27 DIAGNOSIS — C9002 Multiple myeloma in relapse: Secondary | ICD-10-CM | POA: Diagnosis not present

## 2015-12-27 DIAGNOSIS — Z95828 Presence of other vascular implants and grafts: Secondary | ICD-10-CM

## 2015-12-27 DIAGNOSIS — C9001 Multiple myeloma in remission: Secondary | ICD-10-CM

## 2015-12-27 MED ORDER — SODIUM CHLORIDE 0.9 % IV SOLN
INTRAVENOUS | Status: DC
  Administered 2015-12-27: 12:00:00 via INTRAVENOUS

## 2015-12-27 MED ORDER — SODIUM CHLORIDE 0.9 % IJ SOLN
10.0000 mL | INTRAMUSCULAR | Status: DC | PRN
  Administered 2015-12-27: 10 mL via INTRAVENOUS
  Filled 2015-12-27: qty 10

## 2015-12-27 MED ORDER — ZOLEDRONIC ACID 4 MG/100ML IV SOLN
4.0000 mg | Freq: Once | INTRAVENOUS | Status: AC
  Administered 2015-12-27: 4 mg via INTRAVENOUS
  Filled 2015-12-27: qty 100

## 2015-12-27 MED ORDER — HEPARIN SOD (PORK) LOCK FLUSH 100 UNIT/ML IV SOLN
500.0000 [IU] | Freq: Once | INTRAVENOUS | Status: AC | PRN
  Administered 2015-12-27: 500 [IU] via INTRAVENOUS
  Filled 2015-12-27: qty 5

## 2015-12-27 NOTE — Patient Instructions (Signed)

## 2016-01-04 ENCOUNTER — Ambulatory Visit (HOSPITAL_BASED_OUTPATIENT_CLINIC_OR_DEPARTMENT_OTHER): Payer: 59

## 2016-01-04 ENCOUNTER — Other Ambulatory Visit (HOSPITAL_BASED_OUTPATIENT_CLINIC_OR_DEPARTMENT_OTHER): Payer: 59

## 2016-01-04 VITALS — BP 127/95 | HR 82 | Temp 98.3°F | Resp 16

## 2016-01-04 DIAGNOSIS — Z5112 Encounter for antineoplastic immunotherapy: Secondary | ICD-10-CM | POA: Diagnosis not present

## 2016-01-04 DIAGNOSIS — C9001 Multiple myeloma in remission: Secondary | ICD-10-CM

## 2016-01-04 DIAGNOSIS — C9002 Multiple myeloma in relapse: Secondary | ICD-10-CM

## 2016-01-04 LAB — CBC WITH DIFFERENTIAL/PLATELET
BASO%: 0.3 % (ref 0.0–2.0)
Basophils Absolute: 0 10*3/uL (ref 0.0–0.1)
EOS ABS: 0 10*3/uL (ref 0.0–0.5)
EOS%: 0.3 % (ref 0.0–7.0)
HEMATOCRIT: 36.2 % (ref 34.8–46.6)
HGB: 12.3 g/dL (ref 11.6–15.9)
LYMPH%: 23.6 % (ref 14.0–49.7)
MCH: 33.4 pg (ref 25.1–34.0)
MCHC: 34 g/dL (ref 31.5–36.0)
MCV: 98.4 fL (ref 79.5–101.0)
MONO#: 0.6 10*3/uL (ref 0.1–0.9)
MONO%: 10.6 % (ref 0.0–14.0)
NEUT#: 3.8 10*3/uL (ref 1.5–6.5)
NEUT%: 65.2 % (ref 38.4–76.8)
PLATELETS: 141 10*3/uL — AB (ref 145–400)
RBC: 3.68 10*6/uL — AB (ref 3.70–5.45)
RDW: 14.1 % (ref 11.2–14.5)
WBC: 5.8 10*3/uL (ref 3.9–10.3)
lymph#: 1.4 10*3/uL (ref 0.9–3.3)
nRBC: 0 % (ref 0–0)

## 2016-01-04 LAB — COMPREHENSIVE METABOLIC PANEL
ALT: 14 U/L (ref 0–55)
ANION GAP: 9 meq/L (ref 3–11)
AST: 23 U/L (ref 5–34)
Albumin: 4 g/dL (ref 3.5–5.0)
Alkaline Phosphatase: 47 U/L (ref 40–150)
BUN: 13.9 mg/dL (ref 7.0–26.0)
CALCIUM: 8.8 mg/dL (ref 8.4–10.4)
CHLORIDE: 106 meq/L (ref 98–109)
CO2: 25 mEq/L (ref 22–29)
Creatinine: 0.7 mg/dL (ref 0.6–1.1)
EGFR: >90 mL/min/{1.73_m2} (ref >90)
Glucose: 92 mg/dl (ref 70–140)
POTASSIUM: 3.4 meq/L — AB (ref 3.5–5.1)
Sodium: 140 mEq/L (ref 136–145)
Total Bilirubin: 0.67 mg/dL (ref 0.20–1.20)
Total Protein: 6.2 g/dL — ABNORMAL LOW (ref 6.4–8.3)

## 2016-01-04 MED ORDER — SODIUM CHLORIDE 0.9 % IV SOLN
15.5000 mg/kg | Freq: Once | INTRAVENOUS | Status: AC
  Administered 2016-01-04: 900 mg via INTRAVENOUS
  Filled 2016-01-04: qty 40

## 2016-01-04 MED ORDER — PROCHLORPERAZINE MALEATE 10 MG PO TABS
10.0000 mg | ORAL_TABLET | Freq: Once | ORAL | Status: AC
  Administered 2016-01-04: 10 mg via ORAL

## 2016-01-04 MED ORDER — METHYLPREDNISOLONE SODIUM SUCC 125 MG IJ SOLR
125.0000 mg | Freq: Once | INTRAMUSCULAR | Status: AC
  Administered 2016-01-04: 125 mg via INTRAVENOUS

## 2016-01-04 MED ORDER — PROCHLORPERAZINE MALEATE 10 MG PO TABS
ORAL_TABLET | ORAL | Status: AC
  Filled 2016-01-04: qty 1

## 2016-01-04 MED ORDER — DIPHENHYDRAMINE HCL 25 MG PO CAPS
ORAL_CAPSULE | ORAL | Status: AC
  Filled 2016-01-04: qty 2

## 2016-01-04 MED ORDER — SODIUM CHLORIDE 0.9% FLUSH
10.0000 mL | INTRAVENOUS | Status: DC | PRN
  Administered 2016-01-04: 10 mL
  Filled 2016-01-04: qty 10

## 2016-01-04 MED ORDER — BORTEZOMIB CHEMO SQ INJECTION 3.5 MG (2.5MG/ML)
1.3000 mg/m2 | Freq: Once | INTRAMUSCULAR | Status: AC
  Administered 2016-01-04: 2 mg via SUBCUTANEOUS
  Filled 2016-01-04: qty 2

## 2016-01-04 MED ORDER — SODIUM CHLORIDE 0.9 % IV SOLN
Freq: Once | INTRAVENOUS | Status: AC
  Administered 2016-01-04: 10:00:00 via INTRAVENOUS

## 2016-01-04 MED ORDER — HEPARIN SOD (PORK) LOCK FLUSH 100 UNIT/ML IV SOLN
500.0000 [IU] | Freq: Once | INTRAVENOUS | Status: AC | PRN
  Administered 2016-01-04: 500 [IU]
  Filled 2016-01-04: qty 5

## 2016-01-04 MED ORDER — PROCHLORPERAZINE MALEATE 10 MG PO TABS: 10.0000 mg | ORAL_TABLET | Freq: Once | ORAL | Status: DC

## 2016-01-04 MED ORDER — ACETAMINOPHEN 325 MG PO TABS
ORAL_TABLET | ORAL | Status: AC
  Filled 2016-01-04: qty 2

## 2016-01-04 MED ORDER — ACETAMINOPHEN 325 MG PO TABS
650.0000 mg | ORAL_TABLET | Freq: Once | ORAL | Status: AC
  Administered 2016-01-04: 650 mg via ORAL

## 2016-01-04 MED ORDER — DIPHENHYDRAMINE HCL 25 MG PO CAPS
50.0000 mg | ORAL_CAPSULE | Freq: Once | ORAL | Status: AC
  Administered 2016-01-04: 50 mg via ORAL

## 2016-01-04 NOTE — Patient Instructions (Signed)
[facility] Discharge Instructions for Patients Receiving Chemotherapy  Today you received the following chemotherapy agents Darzalex and Velcade   To help prevent nausea and vomiting after your treatment, we encourage you to take your nausea medication as directed.    If you develop nausea and vomiting that is not controlled by your nausea medication, call the clinic.   BELOW ARE SYMPTOMS THAT SHOULD BE REPORTED IMMEDIATELY:  *FEVER GREATER THAN 100.5 F  *CHILLS WITH OR WITHOUT FEVER  NAUSEA AND VOMITING THAT IS NOT CONTROLLED WITH YOUR NAUSEA MEDICATION  *UNUSUAL SHORTNESS OF BREATH  *UNUSUAL BRUISING OR BLEEDING  TENDERNESS IN MOUTH AND THROAT WITH OR WITHOUT PRESENCE OF ULCERS  *URINARY PROBLEMS  *BOWEL PROBLEMS  UNUSUAL RASH Items with * indicate a potential emergency and should be followed up as soon as possible.  Feel free to call the clinic you have any questions or concerns. The clinic phone number is [phone].  Please show the CHEMO ALERT CARD at check-in to the Emergency Department and triage nurse.   

## 2016-01-07 LAB — KAPPA/LAMBDA LIGHT CHAINS
IG LAMBDA FREE LIGHT CHAIN: 2.2 mg/L — AB (ref 5.7–26.3)
Ig Kappa Free Light Chain: 55.4 mg/L — ABNORMAL HIGH (ref 3.3–19.4)
KAPPA/LAMBDA FLC RATIO: 25.18 — AB (ref 0.26–1.65)

## 2016-01-08 LAB — MULTIPLE MYELOMA PANEL, SERUM
ALPHA 1: 0.3 g/dL (ref 0.0–0.4)
ALPHA2 GLOB SERPL ELPH-MCNC: 0.6 g/dL (ref 0.4–1.0)
Albumin SerPl Elph-Mcnc: 3.7 g/dL (ref 2.9–4.4)
Albumin/Glob SerPl: 2 — ABNORMAL HIGH (ref 0.7–1.7)
B-Globulin SerPl Elph-Mcnc: 0.8 g/dL (ref 0.7–1.3)
Gamma Glob SerPl Elph-Mcnc: 0.3 g/dL — ABNORMAL LOW (ref 0.4–1.8)
Globulin, Total: 1.9 g/dL — ABNORMAL LOW (ref 2.2–3.9)
IGG (IMMUNOGLOBIN G), SERUM: 330 mg/dL — AB (ref 700–1600)
IGM (IMMUNOGLOBIN M), SRM: 11 mg/dL — AB (ref 26–217)
IgA, Qn, Serum: 6 mg/dL — ABNORMAL LOW (ref 87–352)
M Protein SerPl Elph-Mcnc: 0.1 g/dL — ABNORMAL HIGH
TOTAL PROTEIN: 5.6 g/dL — AB (ref 6.0–8.5)

## 2016-01-17 ENCOUNTER — Other Ambulatory Visit: Payer: Self-pay | Admitting: *Deleted

## 2016-01-18 ENCOUNTER — Ambulatory Visit (HOSPITAL_BASED_OUTPATIENT_CLINIC_OR_DEPARTMENT_OTHER): Payer: 59

## 2016-01-18 ENCOUNTER — Other Ambulatory Visit (HOSPITAL_BASED_OUTPATIENT_CLINIC_OR_DEPARTMENT_OTHER): Payer: 59

## 2016-01-18 ENCOUNTER — Ambulatory Visit (HOSPITAL_BASED_OUTPATIENT_CLINIC_OR_DEPARTMENT_OTHER): Payer: 59 | Admitting: Hematology and Oncology

## 2016-01-18 ENCOUNTER — Telehealth: Payer: Self-pay | Admitting: *Deleted

## 2016-01-18 ENCOUNTER — Telehealth: Payer: Self-pay | Admitting: Hematology and Oncology

## 2016-01-18 ENCOUNTER — Other Ambulatory Visit: Payer: Self-pay | Admitting: Hematology and Oncology

## 2016-01-18 VITALS — BP 124/79 | HR 85 | Temp 97.7°F | Resp 16

## 2016-01-18 VITALS — BP 113/82 | HR 97 | Temp 98.4°F | Resp 18

## 2016-01-18 DIAGNOSIS — G8929 Other chronic pain: Secondary | ICD-10-CM | POA: Diagnosis not present

## 2016-01-18 DIAGNOSIS — C9001 Multiple myeloma in remission: Secondary | ICD-10-CM

## 2016-01-18 DIAGNOSIS — L270 Generalized skin eruption due to drugs and medicaments taken internally: Secondary | ICD-10-CM | POA: Diagnosis not present

## 2016-01-18 DIAGNOSIS — Z5112 Encounter for antineoplastic immunotherapy: Secondary | ICD-10-CM

## 2016-01-18 DIAGNOSIS — M549 Dorsalgia, unspecified: Secondary | ICD-10-CM

## 2016-01-18 DIAGNOSIS — C9002 Multiple myeloma in relapse: Secondary | ICD-10-CM

## 2016-01-18 LAB — CBC WITH DIFFERENTIAL/PLATELET
BASO%: 0.2 % (ref 0.0–2.0)
Basophils Absolute: 0 10*3/uL (ref 0.0–0.1)
EOS%: 0 % (ref 0.0–7.0)
Eosinophils Absolute: 0 10*3/uL (ref 0.0–0.5)
HCT: 38.8 % (ref 34.8–46.6)
HGB: 12.9 g/dL (ref 11.6–15.9)
LYMPH%: 5.7 % — AB (ref 14.0–49.7)
MCH: 33.3 pg (ref 25.1–34.0)
MCHC: 33.2 g/dL (ref 31.5–36.0)
MCV: 100.3 fL (ref 79.5–101.0)
MONO#: 0 10*3/uL — AB (ref 0.1–0.9)
MONO%: 0.5 % (ref 0.0–14.0)
NEUT%: 93.6 % — AB (ref 38.4–76.8)
NEUTROS ABS: 4.1 10*3/uL (ref 1.5–6.5)
Platelets: 160 10*3/uL (ref 145–400)
RBC: 3.87 10*6/uL (ref 3.70–5.45)
RDW: 13.7 % (ref 11.2–14.5)
WBC: 4.4 10*3/uL (ref 3.9–10.3)
lymph#: 0.3 10*3/uL — ABNORMAL LOW (ref 0.9–3.3)

## 2016-01-18 LAB — COMPREHENSIVE METABOLIC PANEL
ALT: 13 U/L (ref 0–55)
AST: 23 U/L (ref 5–34)
Albumin: 3.9 g/dL (ref 3.5–5.0)
Alkaline Phosphatase: 58 U/L (ref 40–150)
Anion Gap: 9 mEq/L (ref 3–11)
BILIRUBIN TOTAL: 0.48 mg/dL (ref 0.20–1.20)
BUN: 13.3 mg/dL (ref 7.0–26.0)
CHLORIDE: 106 meq/L (ref 98–109)
CO2: 23 meq/L (ref 22–29)
CREATININE: 0.7 mg/dL (ref 0.6–1.1)
Calcium: 8.9 mg/dL (ref 8.4–10.4)
EGFR: >90 mL/min/{1.73_m2} (ref >90)
GLUCOSE: 130 mg/dL (ref 70–140)
Potassium: 4.3 mEq/L (ref 3.5–5.1)
SODIUM: 138 meq/L (ref 136–145)
TOTAL PROTEIN: 6.3 g/dL — AB (ref 6.4–8.3)

## 2016-01-18 MED ORDER — BORTEZOMIB CHEMO SQ INJECTION 3.5 MG (2.5MG/ML)
1.3000 mg/m2 | Freq: Once | INTRAMUSCULAR | Status: AC
  Administered 2016-01-18: 2 mg via SUBCUTANEOUS
  Filled 2016-01-18: qty 2

## 2016-01-18 MED ORDER — SODIUM CHLORIDE 0.9% FLUSH
10.0000 mL | INTRAVENOUS | Status: DC | PRN
  Administered 2016-01-18: 10 mL
  Filled 2016-01-18: qty 10

## 2016-01-18 MED ORDER — DARATUMUMAB CHEMO INJECTION 400 MG/20ML
15.5000 mg/kg | Freq: Once | INTRAVENOUS | Status: AC
  Administered 2016-01-18: 900 mg via INTRAVENOUS
  Filled 2016-01-18: qty 40

## 2016-01-18 MED ORDER — PROCHLORPERAZINE MALEATE 10 MG PO TABS: 10.0000 mg | ORAL_TABLET | Freq: Once | ORAL | Status: DC

## 2016-01-18 MED ORDER — DIPHENHYDRAMINE HCL 25 MG PO CAPS
50.0000 mg | ORAL_CAPSULE | Freq: Once | ORAL | Status: AC
  Administered 2016-01-18: 50 mg via ORAL

## 2016-01-18 MED ORDER — DIPHENHYDRAMINE HCL 25 MG PO CAPS
ORAL_CAPSULE | ORAL | Status: AC
  Filled 2016-01-18: qty 2

## 2016-01-18 MED ORDER — PROCHLORPERAZINE MALEATE 10 MG PO TABS
10.0000 mg | ORAL_TABLET | Freq: Once | ORAL | Status: AC
  Administered 2016-01-18: 10 mg via ORAL

## 2016-01-18 MED ORDER — HEPARIN SOD (PORK) LOCK FLUSH 100 UNIT/ML IV SOLN
500.0000 [IU] | Freq: Once | INTRAVENOUS | Status: AC | PRN
  Administered 2016-01-18: 500 [IU]
  Filled 2016-01-18: qty 5

## 2016-01-18 MED ORDER — METHYLPREDNISOLONE SODIUM SUCC 125 MG IJ SOLR
125.0000 mg | Freq: Once | INTRAMUSCULAR | Status: AC
  Administered 2016-01-18: 125 mg via INTRAVENOUS

## 2016-01-18 MED ORDER — PROCHLORPERAZINE MALEATE 10 MG PO TABS
ORAL_TABLET | ORAL | Status: AC
  Filled 2016-01-18: qty 1

## 2016-01-18 MED ORDER — METHYLPREDNISOLONE SODIUM SUCC 125 MG IJ SOLR
INTRAMUSCULAR | Status: AC
  Filled 2016-01-18: qty 2

## 2016-01-18 MED ORDER — ACETAMINOPHEN 325 MG PO TABS
ORAL_TABLET | ORAL | Status: AC
  Filled 2016-01-18: qty 2

## 2016-01-18 MED ORDER — ACETAMINOPHEN 325 MG PO TABS
650.0000 mg | ORAL_TABLET | Freq: Once | ORAL | Status: AC
  Administered 2016-01-18: 650 mg via ORAL

## 2016-01-18 MED ORDER — SODIUM CHLORIDE 0.9 % IV SOLN
Freq: Once | INTRAVENOUS | Status: AC
  Administered 2016-01-18: 10:00:00 via INTRAVENOUS

## 2016-01-18 NOTE — Telephone Encounter (Signed)
Message sent to Chemo scheduler to be added per 01/18/16 los. Per Dr. Alvy Bimler, she will se patient in Infusion area on 02/15/16 @ 11 a.m. for 30 minutes. Appointments scheduled per 01/18/16 los. AVS report and appointment schedule was given to patient, per 01/18/16 los.

## 2016-01-18 NOTE — Assessment & Plan Note (Signed)
Her back pain is drastically improved She will continue close follow-up at the pain clinic. She will continue calcium and vitamin D. 

## 2016-01-18 NOTE — Patient Instructions (Signed)
[place] Discharge Instructions for Patients Receiving Chemotherapy  Today you received the following chemotherapy agents Velcade/Darzalex  To help prevent nausea and vomiting after your treatment, we encourage you to take your nausea medication    If you develop nausea and vomiting that is not controlled by your nausea medication, call the clinic.   BELOW ARE SYMPTOMS THAT SHOULD BE REPORTED IMMEDIATELY:  *FEVER GREATER THAN 100.5 F  *CHILLS WITH OR WITHOUT FEVER  NAUSEA AND VOMITING THAT IS NOT CONTROLLED WITH YOUR NAUSEA MEDICATION  *UNUSUAL SHORTNESS OF BREATH  *UNUSUAL BRUISING OR BLEEDING  TENDERNESS IN MOUTH AND THROAT WITH OR WITHOUT PRESENCE OF ULCERS  *URINARY PROBLEMS  *BOWEL PROBLEMS  UNUSUAL RASH Items with * indicate a potential emergency and should be followed up as soon as possible.  Feel free to call the clinic you have any questions or concerns. The clinic phone number is [phone].  Please show the [place] at check-in to the Emergency Department and triage nurse.

## 2016-01-18 NOTE — Telephone Encounter (Signed)
Per LOS I have scheduled appts and notified the scheduler 

## 2016-01-18 NOTE — Assessment & Plan Note (Signed)
I reviewed recent myeloma panel with the patient and her husband. Her recent blood work suggested VGPR I explained to the patient and her husband the rationale behind modification of dosing.  Moving forward, she will come here every other week to get infusional treatment along with subcutaneous Velcade The patient will receive zometa every 6 months, next due in April 2018 She will continue calcium with vitamin D and acyclovir for antimicrobial prophylaxis I recommend she stops dexamethasone

## 2016-01-18 NOTE — Progress Notes (Signed)
[place] OFFICE PROGRESS NOTE  Patient Care Team: [name], MD as PCP - General [name], MD as Consulting Physician (Oncology) [name], MD as Consulting Physician (Neurosurgery) [name], MD as Consulting Physician (Hematology and Oncology)  SUMMARY OF ONCOLOGIC HISTORY:   Multiple myeloma in remission [facility])   07/21/2007 Bone Marrow Biopsy    Case #: [id number] Bone marrow showed myeloma      07/21/2007 Miscellaneous    She was diagnosed in May 2009. Had several cycles of RVD      10/14/2007 Bone Marrow Biopsy    Case #: [id number] repeat bone marrow biopsy showed good response to Rx      12/31/2007 Bone Marrow Transplant    She had autologous BMT at [facility]      07/19/2009 Bone Marrow Biopsy    Case #: [id number] Bone marrow biopsy showed only 1% plasma cells      10/10/2010 Bone Marrow Biopsy    [id number] Bone marrow biopsy showed 2% plasma cells      09/25/2011 Bone Marrow Biopsy    Accession #: [id number] Bone marrow only showed 4% plasma cells with ligh chain excess      10/14/2011 - 06/13/2013 Chemotherapy    She received Revlimid only, discontinued due to pancytopenia      06/18/2015 - 09/13/2015 Chemotherapy    She resumed taking Revlimid and dexamethasone. Treatment is stopped due to minimum response and pancytopenia      10/19/2015 -  Chemotherapy    The patient had bortezomib SQ (VELCADE) chemo injection 2 mg, 1.3 mg/m2 = 2 mg, Subcutaneous,  Once, 2 of 8 cycles  for chemotherapy treatment along with weekly Daratumumab       INTERVAL HISTORY: Please see below for problem oriented charting. She is sitting in the infusion room She feels well. She has minor bruising from recent Velcade injection Her back pain is stable She denies recent infection  REVIEW OF SYSTEMS:   Constitutional: Denies fevers, chills or abnormal weight loss Eyes: Denies blurriness of vision Ears, nose, mouth, throat, and face: Denies mucositis  or sore throat Respiratory: Denies cough, dyspnea or wheezes Cardiovascular: Denies palpitation, chest discomfort or lower extremity swelling Gastrointestinal:  Denies nausea, heartburn or change in bowel habits Lymphatics: Denies new lymphadenopathy or easy bruising Neurological:Denies numbness, tingling or new weaknesses Behavioral/Psych: Mood is stable, no new changes  All other systems were reviewed with the patient and are negative.  I have reviewed the past medical history, past surgical history, social history and family history with the patient and they are unchanged from previous note.  ALLERGIES:  is allergic to hydromorphone.  MEDICATIONS:  Current Outpatient Prescriptions  Medication Sig Dispense Refill  . acyclovir (ZOVIRAX) 400 MG tablet Take 1 tablet (400 mg total) by mouth 2 (two) times daily. 60 tablet 3  . aspirin 81 MG tablet Take 81 mg by mouth.    . cholecalciferol (VITAMIN D) 1000 UNITS tablet Take 1,000 Units by mouth daily.    Marland Kitchen dexamethasone (DECADRON) 4 MG tablet Take 5 tablets (20 mg total) by mouth once a week. 60 tablet 1  . Multiple Vitamins-Minerals (ONE-A-DAY 50 PLUS PO) Take by mouth daily.    Marland Kitchen oxycodone (OXY-IR) 5 MG capsule Take 5 mg by mouth every 4 (four) hours as needed. pain    . Oxycodone HCl (OXYCONTIN) 60 MG TB12 Take 1 tablet by mouth 2 (two) times daily.    . pantoprazole (PROTONIX) 40 MG tablet  TAKE (1) TABLET DAILY AS NEEDED. 30 tablet 0  . prochlorperazine (COMPAZINE) 10 MG tablet Take 1 tablet (10 mg total) by mouth every 6 (six) hours as needed (Nausea or vomiting). 30 tablet 1   No current facility-administered medications for this visit.    Facility-Administered Medications Ordered in Other Visits  Medication Dose Route Frequency Provider Last Rate Last Dose  . 0.9 %  sodium chloride infusion   Intravenous Once [name], MD      . acetaminophen (TYLENOL) tablet 650 mg  650 mg Oral Once [name], MD      . bortezomib SQ (VELCADE)  chemo injection 2 mg  1.3 mg/m2 (Treatment Plan Recorded) Subcutaneous Once [name], MD      . daratumumab (DARZALEX) 900 mg in sodium chloride 0.9 % 455 mL (1.8 mg/mL) chemo infusion  15.5 mg/kg (Treatment Plan Recorded) Intravenous Once [name], MD      . diphenhydrAMINE (BENADRYL) capsule 50 mg  50 mg Oral Once [name], MD      . heparin lock flush 100 unit/mL  500 Units Intracatheter Once PRN [name], MD      . methylPREDNISolone sodium succinate (SOLU-MEDROL) 125 mg/2 mL injection 125 mg  125 mg Intravenous Once [name], MD      . prochlorperazine (COMPAZINE) tablet 10 mg  10 mg Oral Once [name], MD      . prochlorperazine (COMPAZINE) tablet 10 mg  10 mg Oral Once [name], MD      . sodium chloride flush (NS) 0.9 % injection 10 mL  10 mL Intracatheter PRN [name], MD        PHYSICAL EXAMINATION: ECOG PERFORMANCE STATUS: 1 - Symptomatic but completely ambulatory  Vitals:   01/18/16 0937  BP: 113/82  Pulse: 97  Resp: 18  Temp: 98.4 F (36.9 C)   There were no vitals filed for this visit.  GENERAL:alert, no distress and comfortable SKIN: Noted mild bruising and rash at prior injection site EYES: normal, Conjunctiva are pink and non-injected, sclera clear OROPHARYNX:no exudate, no erythema and lips, buccal mucosa, and tongue normal  NECK: supple, thyroid normal size, non-tender, without nodularity LYMPH:  no palpable lymphadenopathy in the cervical, axillary or inguinal LUNGS: clear to auscultation and percussion with normal breathing effort HEART: regular rate & rhythm and no murmurs and no lower extremity edema ABDOMEN:abdomen soft, non-tender and normal bowel sounds Musculoskeletal:no cyanosis of digits and no clubbing  NEURO: alert & oriented x 3 with fluent speech, no focal motor/sensory deficits  LABORATORY DATA:  I have reviewed the data as listed    Component Value Date/Time   NA 138 01/18/2016 0837   K 4.3 01/18/2016 0837   CL 107 08/30/2012  1055   CO2 23 01/18/2016 0837   GLUCOSE 130 01/18/2016 0837   GLUCOSE 102 (H) 08/30/2012 1055   BUN 13.3 01/18/2016 0837   CREATININE 0.7 01/18/2016 0837   CALCIUM 8.9 01/18/2016 0837   PROT 6.3 (L) 01/18/2016 0837   ALBUMIN 3.9 01/18/2016 0837   AST 23 01/18/2016 0837   ALT 13 01/18/2016 0837   ALKPHOS 58 01/18/2016 0837   BILITOT 0.48 01/18/2016 0837    No results found for: SPEP, UPEP  Lab Results  Component Value Date   WBC 4.4 01/18/2016   NEUTROABS 4.1 01/18/2016   HGB 12.9 01/18/2016   HCT 38.8 01/18/2016   MCV 100.3 01/18/2016   PLT 160 01/18/2016      Chemistry  Component Value Date/Time   NA 138 01/18/2016 0837   K 4.3 01/18/2016 0837   CL 107 08/30/2012 1055   CO2 23 01/18/2016 0837   BUN 13.3 01/18/2016 0837   CREATININE 0.7 01/18/2016 0837      Component Value Date/Time   CALCIUM 8.9 01/18/2016 0837   ALKPHOS 58 01/18/2016 0837   AST 23 01/18/2016 0837   ALT 13 01/18/2016 0837   BILITOT 0.48 01/18/2016 0837      ASSESSMENT & PLAN:  Multiple myeloma in remission ([place]) I reviewed recent myeloma panel with the patient and her husband. Her recent blood work suggested VGPR I explained to the patient and her husband the rationale behind modification of dosing.  Moving forward, she will come here every other week to get infusional treatment along with subcutaneous Velcade The patient will receive zometa every 6 months, next due in April 2018 She will continue calcium with vitamin D and acyclovir for antimicrobial prophylaxis I recommend she stops dexamethasone  Chronic back pain greater than 3 months duration Her back pain is drastically improved She will continue close follow-up at the pain clinic. She will continue calcium and vitamin D.  Drug-induced skin rash She has minus skin rash at prior Velcade injection site. Clinically, it does not look infected. I reassured the patient.   Orders Placed This Encounter  Procedures  .  Kappa/lambda light chains    Standing Status:   Future    Standing Expiration Date:   02/21/2017  . Multiple Myeloma Panel (SPEP&IFE w/QIG)    Standing Status:   Future    Standing Expiration Date:   02/21/2017   All questions were answered. The patient knows to call the clinic with any problems, questions or concerns. No barriers to learning was detected. I spent 15 minutes counseling the patient face to face. The total time spent in the appointment was 20 minutes and more than 50% was on counseling and review of test results     [name], MD 01/18/2016 10:25 AM

## 2016-01-18 NOTE — Assessment & Plan Note (Signed)
She has minus skin rash at prior Velcade injection site. Clinically, it does not look infected. I reassured the patient.

## 2016-02-01 ENCOUNTER — Other Ambulatory Visit (HOSPITAL_BASED_OUTPATIENT_CLINIC_OR_DEPARTMENT_OTHER): Payer: 59

## 2016-02-01 ENCOUNTER — Ambulatory Visit (HOSPITAL_BASED_OUTPATIENT_CLINIC_OR_DEPARTMENT_OTHER): Payer: 59

## 2016-02-01 VITALS — BP 123/73 | HR 89 | Temp 98.2°F | Resp 16

## 2016-02-01 DIAGNOSIS — C9001 Multiple myeloma in remission: Secondary | ICD-10-CM

## 2016-02-01 DIAGNOSIS — Z5112 Encounter for antineoplastic immunotherapy: Secondary | ICD-10-CM

## 2016-02-01 DIAGNOSIS — C9002 Multiple myeloma in relapse: Secondary | ICD-10-CM

## 2016-02-01 LAB — CBC WITH DIFFERENTIAL/PLATELET
BASO%: 1.4 % (ref 0.0–2.0)
Basophils Absolute: 0.1 10*3/uL (ref 0.0–0.1)
EOS%: 1.7 % (ref 0.0–7.0)
Eosinophils Absolute: 0.1 10*3/uL (ref 0.0–0.5)
HCT: 36.7 % (ref 34.8–46.6)
HEMOGLOBIN: 12.3 g/dL (ref 11.6–15.9)
LYMPH%: 22 % (ref 14.0–49.7)
MCH: 33.5 pg (ref 25.1–34.0)
MCHC: 33.4 g/dL (ref 31.5–36.0)
MCV: 100.3 fL (ref 79.5–101.0)
MONO#: 0.5 10*3/uL (ref 0.1–0.9)
MONO%: 9.8 % (ref 0.0–14.0)
NEUT#: 3.2 10*3/uL (ref 1.5–6.5)
NEUT%: 65.1 % (ref 38.4–76.8)
Platelets: 160 10*3/uL (ref 145–400)
RBC: 3.66 10*6/uL — ABNORMAL LOW (ref 3.70–5.45)
RDW: 13.6 % (ref 11.2–14.5)
WBC: 5 10*3/uL (ref 3.9–10.3)
lymph#: 1.1 10*3/uL (ref 0.9–3.3)

## 2016-02-01 LAB — COMPREHENSIVE METABOLIC PANEL
ALBUMIN: 3.8 g/dL (ref 3.5–5.0)
ALK PHOS: 50 U/L (ref 40–150)
ALT: 13 U/L (ref 0–55)
AST: 23 U/L (ref 5–34)
Anion Gap: 10 mEq/L (ref 3–11)
BUN: 16.7 mg/dL (ref 7.0–26.0)
CO2: 25 meq/L (ref 22–29)
Calcium: 9 mg/dL (ref 8.4–10.4)
Chloride: 105 mEq/L (ref 98–109)
Creatinine: 0.8 mg/dL (ref 0.6–1.1)
EGFR: 89 mL/min/{1.73_m2} — ABNORMAL LOW (ref >90)
GLUCOSE: 93 mg/dL (ref 70–140)
POTASSIUM: 3.9 meq/L (ref 3.5–5.1)
SODIUM: 140 meq/L (ref 136–145)
TOTAL PROTEIN: 6 g/dL — AB (ref 6.4–8.3)
Total Bilirubin: 0.49 mg/dL (ref 0.20–1.20)

## 2016-02-01 MED ORDER — PROCHLORPERAZINE MALEATE 10 MG PO TABS
ORAL_TABLET | ORAL | Status: AC
  Filled 2016-02-01: qty 1

## 2016-02-01 MED ORDER — SODIUM CHLORIDE 0.9 % IV SOLN
Freq: Once | INTRAVENOUS | Status: AC
  Administered 2016-02-01: 09:00:00 via INTRAVENOUS

## 2016-02-01 MED ORDER — METHYLPREDNISOLONE SODIUM SUCC 125 MG IJ SOLR
INTRAMUSCULAR | Status: AC
  Filled 2016-02-01: qty 2

## 2016-02-01 MED ORDER — DIPHENHYDRAMINE HCL 25 MG PO CAPS
50.0000 mg | ORAL_CAPSULE | Freq: Once | ORAL | Status: AC
  Administered 2016-02-01: 50 mg via ORAL

## 2016-02-01 MED ORDER — PROCHLORPERAZINE MALEATE 10 MG PO TABS: 10.0000 mg | ORAL_TABLET | Freq: Once | ORAL | Status: AC

## 2016-02-01 MED ORDER — HEPARIN SOD (PORK) LOCK FLUSH 100 UNIT/ML IV SOLN
500.0000 [IU] | Freq: Once | INTRAVENOUS | Status: AC | PRN
  Administered 2016-02-01: 500 [IU]
  Filled 2016-02-01: qty 5

## 2016-02-01 MED ORDER — SODIUM CHLORIDE 0.9 % IV SOLN
15.6000 mg/kg | Freq: Once | INTRAVENOUS | Status: AC
  Administered 2016-02-01: 900 mg via INTRAVENOUS
  Filled 2016-02-01: qty 40

## 2016-02-01 MED ORDER — PROCHLORPERAZINE MALEATE 10 MG PO TABS
10.0000 mg | ORAL_TABLET | Freq: Once | ORAL | Status: AC
  Administered 2016-02-01: 10 mg via ORAL

## 2016-02-01 MED ORDER — ACETAMINOPHEN 325 MG PO TABS
650.0000 mg | ORAL_TABLET | Freq: Once | ORAL | Status: AC
  Administered 2016-02-01: 650 mg via ORAL

## 2016-02-01 MED ORDER — SODIUM CHLORIDE 0.9% FLUSH
10.0000 mL | INTRAVENOUS | Status: DC | PRN
  Administered 2016-02-01: 10 mL
  Filled 2016-02-01: qty 10

## 2016-02-01 MED ORDER — METHYLPREDNISOLONE SODIUM SUCC 125 MG IJ SOLR
125.0000 mg | Freq: Once | INTRAMUSCULAR | Status: AC
  Administered 2016-02-01: 125 mg via INTRAVENOUS

## 2016-02-01 MED ORDER — ACETAMINOPHEN 325 MG PO TABS
ORAL_TABLET | ORAL | Status: AC
  Filled 2016-02-01: qty 2

## 2016-02-01 MED ORDER — BORTEZOMIB CHEMO SQ INJECTION 3.5 MG (2.5MG/ML)
1.3000 mg/m2 | Freq: Once | INTRAMUSCULAR | Status: AC
  Administered 2016-02-01: 2 mg via SUBCUTANEOUS
  Filled 2016-02-01: qty 2

## 2016-02-01 NOTE — Patient Instructions (Signed)
[place] Discharge Instructions for Patients Receiving Chemotherapy  Today you received the following chemotherapy agents Velcade/Darzalex  To help prevent nausea and vomiting after your treatment, we encourage you to take your nausea medication    If you develop nausea and vomiting that is not controlled by your nausea medication, call the clinic.   BELOW ARE SYMPTOMS THAT SHOULD BE REPORTED IMMEDIATELY:  *FEVER GREATER THAN 100.5 F  *CHILLS WITH OR WITHOUT FEVER  NAUSEA AND VOMITING THAT IS NOT CONTROLLED WITH YOUR NAUSEA MEDICATION  *UNUSUAL SHORTNESS OF BREATH  *UNUSUAL BRUISING OR BLEEDING  TENDERNESS IN MOUTH AND THROAT WITH OR WITHOUT PRESENCE OF ULCERS  *URINARY PROBLEMS  *BOWEL PROBLEMS  UNUSUAL RASH Items with * indicate a potential emergency and should be followed up as soon as possible.  Feel free to call the clinic you have any questions or concerns. The clinic phone number is [phone].  Please show the [place] at check-in to the Emergency Department and triage nurse.

## 2016-02-04 LAB — KAPPA/LAMBDA LIGHT CHAINS
IG KAPPA FREE LIGHT CHAIN: 63.6 mg/L — AB (ref 3.3–19.4)
Ig Lambda Free Light Chain: 8.4 mg/L (ref 5.7–26.3)
KAPPA/LAMBDA FLC RATIO: 7.57 — AB (ref 0.26–1.65)

## 2016-02-05 LAB — MULTIPLE MYELOMA PANEL, SERUM
ALBUMIN/GLOB SERPL: 2.2 — AB (ref 0.7–1.7)
ALPHA2 GLOB SERPL ELPH-MCNC: 0.7 g/dL (ref 0.4–1.0)
Albumin SerPl Elph-Mcnc: 3.9 g/dL (ref 2.9–4.4)
Alpha 1: 0.3 g/dL (ref 0.0–0.4)
B-Globulin SerPl Elph-Mcnc: 0.6 g/dL — ABNORMAL LOW (ref 0.7–1.3)
Gamma Glob SerPl Elph-Mcnc: 0.3 g/dL — ABNORMAL LOW (ref 0.4–1.8)
Globulin, Total: 1.8 g/dL — ABNORMAL LOW (ref 2.2–3.9)
IGG (IMMUNOGLOBIN G), SERUM: 322 mg/dL — AB (ref 700–1600)
IGM (IMMUNOGLOBIN M), SRM: 23 mg/dL — AB (ref 26–217)
M Protein SerPl Elph-Mcnc: 0.1 g/dL — ABNORMAL HIGH
Total Protein: 5.7 g/dL — ABNORMAL LOW (ref 6.0–8.5)

## 2016-02-15 ENCOUNTER — Other Ambulatory Visit: Payer: Self-pay | Admitting: Hematology and Oncology

## 2016-02-15 ENCOUNTER — Other Ambulatory Visit (HOSPITAL_BASED_OUTPATIENT_CLINIC_OR_DEPARTMENT_OTHER): Payer: 59

## 2016-02-15 ENCOUNTER — Telehealth: Payer: Self-pay | Admitting: Hematology and Oncology

## 2016-02-15 ENCOUNTER — Encounter: Payer: Self-pay | Admitting: Hematology and Oncology

## 2016-02-15 ENCOUNTER — Ambulatory Visit (HOSPITAL_BASED_OUTPATIENT_CLINIC_OR_DEPARTMENT_OTHER): Payer: 59

## 2016-02-15 ENCOUNTER — Ambulatory Visit (HOSPITAL_BASED_OUTPATIENT_CLINIC_OR_DEPARTMENT_OTHER): Payer: 59 | Admitting: Hematology and Oncology

## 2016-02-15 VITALS — BP 123/72 | HR 89 | Temp 98.1°F | Resp 18

## 2016-02-15 DIAGNOSIS — C9001 Multiple myeloma in remission: Secondary | ICD-10-CM

## 2016-02-15 DIAGNOSIS — D696 Thrombocytopenia, unspecified: Secondary | ICD-10-CM | POA: Diagnosis not present

## 2016-02-15 DIAGNOSIS — L270 Generalized skin eruption due to drugs and medicaments taken internally: Secondary | ICD-10-CM

## 2016-02-15 DIAGNOSIS — Z5112 Encounter for antineoplastic immunotherapy: Secondary | ICD-10-CM

## 2016-02-15 DIAGNOSIS — C9002 Multiple myeloma in relapse: Secondary | ICD-10-CM

## 2016-02-15 LAB — CBC WITH DIFFERENTIAL/PLATELET
BASO%: 0.8 % (ref 0.0–2.0)
BASOS ABS: 0 10*3/uL (ref 0.0–0.1)
EOS%: 1.8 % (ref 0.0–7.0)
Eosinophils Absolute: 0.1 10*3/uL (ref 0.0–0.5)
HEMATOCRIT: 36.3 % (ref 34.8–46.6)
HGB: 12.1 g/dL (ref 11.6–15.9)
LYMPH#: 1 10*3/uL (ref 0.9–3.3)
LYMPH%: 26.5 % (ref 14.0–49.7)
MCH: 33.3 pg (ref 25.1–34.0)
MCHC: 33.3 g/dL (ref 31.5–36.0)
MCV: 100 fL (ref 79.5–101.0)
MONO#: 0.4 10*3/uL (ref 0.1–0.9)
MONO%: 10.9 % (ref 0.0–14.0)
NEUT#: 2.4 10*3/uL (ref 1.5–6.5)
NEUT%: 60 % (ref 38.4–76.8)
PLATELETS: 132 10*3/uL — AB (ref 145–400)
RBC: 3.63 10*6/uL — AB (ref 3.70–5.45)
RDW: 12.8 % (ref 11.2–14.5)
WBC: 3.9 10*3/uL (ref 3.9–10.3)

## 2016-02-15 LAB — COMPREHENSIVE METABOLIC PANEL
ALT: 13 U/L (ref 0–55)
ANION GAP: 10 meq/L (ref 3–11)
AST: 23 U/L (ref 5–34)
Albumin: 3.8 g/dL (ref 3.5–5.0)
Alkaline Phosphatase: 51 U/L (ref 40–150)
BUN: 14.1 mg/dL (ref 7.0–26.0)
CALCIUM: 9.1 mg/dL (ref 8.4–10.4)
CHLORIDE: 105 meq/L (ref 98–109)
CO2: 27 mEq/L (ref 22–29)
CREATININE: 0.7 mg/dL (ref 0.6–1.1)
Glucose: 100 mg/dl (ref 70–140)
POTASSIUM: 3.8 meq/L (ref 3.5–5.1)
Sodium: 141 mEq/L (ref 136–145)
Total Bilirubin: 0.46 mg/dL (ref 0.20–1.20)
Total Protein: 6.1 g/dL — ABNORMAL LOW (ref 6.4–8.3)

## 2016-02-15 MED ORDER — SODIUM CHLORIDE 0.9% FLUSH
10.0000 mL | INTRAVENOUS | Status: DC | PRN
  Administered 2016-02-15: 10 mL
  Filled 2016-02-15: qty 10

## 2016-02-15 MED ORDER — ACETAMINOPHEN 325 MG PO TABS
650.0000 mg | ORAL_TABLET | Freq: Once | ORAL | Status: AC
  Administered 2016-02-15: 650 mg via ORAL

## 2016-02-15 MED ORDER — BORTEZOMIB CHEMO SQ INJECTION 3.5 MG (2.5MG/ML)
1.3000 mg/m2 | Freq: Once | INTRAMUSCULAR | Status: AC
  Administered 2016-02-15: 2 mg via SUBCUTANEOUS
  Filled 2016-02-15: qty 2

## 2016-02-15 MED ORDER — METHYLPREDNISOLONE SODIUM SUCC 125 MG IJ SOLR
INTRAMUSCULAR | Status: AC
  Filled 2016-02-15: qty 2

## 2016-02-15 MED ORDER — HEPARIN SOD (PORK) LOCK FLUSH 100 UNIT/ML IV SOLN
500.0000 [IU] | Freq: Once | INTRAVENOUS | Status: AC | PRN
  Administered 2016-02-15: 500 [IU]
  Filled 2016-02-15: qty 5

## 2016-02-15 MED ORDER — PROCHLORPERAZINE MALEATE 10 MG PO TABS
ORAL_TABLET | ORAL | Status: AC
  Filled 2016-02-15: qty 1

## 2016-02-15 MED ORDER — METHYLPREDNISOLONE SODIUM SUCC 125 MG IJ SOLR
125.0000 mg | Freq: Once | INTRAMUSCULAR | Status: AC
  Administered 2016-02-15: 125 mg via INTRAVENOUS

## 2016-02-15 MED ORDER — DIPHENHYDRAMINE HCL 25 MG PO CAPS
50.0000 mg | ORAL_CAPSULE | Freq: Once | ORAL | Status: AC
  Administered 2016-02-15: 50 mg via ORAL

## 2016-02-15 MED ORDER — PROCHLORPERAZINE MALEATE 10 MG PO TABS
10.0000 mg | ORAL_TABLET | Freq: Once | ORAL | Status: AC
  Administered 2016-02-15: 10 mg via ORAL

## 2016-02-15 MED ORDER — PROCHLORPERAZINE MALEATE 10 MG PO TABS: 10.0000 mg | ORAL_TABLET | Freq: Once | ORAL | Status: DC

## 2016-02-15 MED ORDER — DARATUMUMAB CHEMO INJECTION 400 MG/20ML
15.5000 mg/kg | Freq: Once | INTRAVENOUS | Status: AC
  Administered 2016-02-15: 900 mg via INTRAVENOUS
  Filled 2016-02-15: qty 40

## 2016-02-15 MED ORDER — SODIUM CHLORIDE 0.9 % IV SOLN: Freq: Once | INTRAVENOUS | Status: DC

## 2016-02-15 MED ORDER — DIPHENHYDRAMINE HCL 25 MG PO CAPS
ORAL_CAPSULE | ORAL | Status: AC
  Filled 2016-02-15: qty 2

## 2016-02-15 MED ORDER — ACETAMINOPHEN 325 MG PO TABS
ORAL_TABLET | ORAL | Status: AC
  Filled 2016-02-15: qty 2

## 2016-02-15 NOTE — Patient Instructions (Signed)
[place] Discharge Instructions for Patients Receiving Chemotherapy  Today you received the following chemotherapy agents Velcade/Darzalex  To help prevent nausea and vomiting after your treatment, we encourage you to take your nausea medication    If you develop nausea and vomiting that is not controlled by your nausea medication, call the clinic.   BELOW ARE SYMPTOMS THAT SHOULD BE REPORTED IMMEDIATELY:  *FEVER GREATER THAN 100.5 F  *CHILLS WITH OR WITHOUT FEVER  NAUSEA AND VOMITING THAT IS NOT CONTROLLED WITH YOUR NAUSEA MEDICATION  *UNUSUAL SHORTNESS OF BREATH  *UNUSUAL BRUISING OR BLEEDING  TENDERNESS IN MOUTH AND THROAT WITH OR WITHOUT PRESENCE OF ULCERS  *URINARY PROBLEMS  *BOWEL PROBLEMS  UNUSUAL RASH Items with * indicate a potential emergency and should be followed up as soon as possible.  Feel free to call the clinic you have any questions or concerns. The clinic phone number is [phone].  Please show the [place] at check-in to the Emergency Department and triage nurse.

## 2016-02-15 NOTE — Assessment & Plan Note (Signed)
She has minus skin rash at prior Velcade injection site. Clinically, it does not look infected. I reassured the patient.

## 2016-02-15 NOTE — Assessment & Plan Note (Signed)
The cause is unknown. It is mild and there is little change compared from previous platelet count. The patient denies recent history of bleeding such as epistaxis, hematuria or hematochezia. She is asymptomatic from the thrombocytopenia. I will observe for now.  

## 2016-02-15 NOTE — Assessment & Plan Note (Signed)
I reviewed recent myeloma panel with the patient and her husband. Her recent blood work suggested VGPR She has successfully completed dexamethasone taper in November 2017 I explained to the patient and her husband the rationale behind modification of dosing.  Moving forward, she will come here every other week to get infusional treatment along with subcutaneous Velcade The patient will receive zometa every 6 months, next due in April 2018 She will continue calcium with vitamin D and acyclovir for antimicrobial prophylaxis

## 2016-02-15 NOTE — Progress Notes (Signed)
Patient's spouse came in to inquire about a voicemail I left back in October. Advised him that was to introduce myself as patient's Estate manager/land agent and to discuss any financial questions or concerns they may have. Asked him if their insurance renews in January and he said yes. Provided him with information on LLS who currently has available funding for MM and advised that funds may run out quickly, therefore the sooner they apply the better. Advised they would need to provide proof of household income and all of the requirements are included in the material given. Also asked about Revlimid and Velcade. He states she is done with Revlimid and has been provided with information from [place] if she needed in the future to call them. As far as the Velcade, he believes they will find out today at appointment with the doctor. Provided information on the Velcade Reimbursement program and the number to apply if needed. Provided my card for any additional financial questions or concerns.

## 2016-02-15 NOTE — Telephone Encounter (Signed)
Gave patient spouse avs report and appointments for December and January.   Per previous instruction from infusion scheduler patient is to be scheduled in the H section for chemo at @ 8:15 am with labs drawn in infusion.

## 2016-02-15 NOTE — Progress Notes (Signed)
[place] OFFICE PROGRESS NOTE  Patient Care Team: [name], MD as PCP - General [name], MD as Consulting Physician (Oncology) [name], MD as Consulting Physician (Neurosurgery) [name], MD as Consulting Physician (Hematology and Oncology)  SUMMARY OF ONCOLOGIC HISTORY:   Multiple myeloma in remission [facility])   07/21/2007 Bone Marrow Biopsy    Case #: [id number] Bone marrow showed myeloma      07/21/2007 Miscellaneous    She was diagnosed in May 2009. Had several cycles of RVD      10/14/2007 Bone Marrow Biopsy    Case #: [id number] repeat bone marrow biopsy showed good response to Rx      12/31/2007 Bone Marrow Transplant    She had autologous BMT at [facility]      07/19/2009 Bone Marrow Biopsy    Case #: [id number] Bone marrow biopsy showed only 1% plasma cells      10/10/2010 Bone Marrow Biopsy    [id number] Bone marrow biopsy showed 2% plasma cells      09/25/2011 Bone Marrow Biopsy    Accession #: [id number] Bone marrow only showed 4% plasma cells with ligh chain excess      10/14/2011 - 06/13/2013 Chemotherapy    She received Revlimid only, discontinued due to pancytopenia      06/18/2015 - 09/13/2015 Chemotherapy    She resumed taking Revlimid and dexamethasone. Treatment is stopped due to minimum response and pancytopenia      10/19/2015 -  Chemotherapy    The patient had bortezomib SQ (VELCADE) chemo injection 2 mg, 1.3 mg/m2 = 2 mg, Subcutaneous,  Once, 2 of 8 cycles  for chemotherapy treatment along with weekly Daratumumab       INTERVAL HISTORY: Please see below for problem oriented charting. She is seen in the treatment room today. She feels well. Denies recent infection. She had minus skin rash from prior injection, stable. Denies worsening neuropathy. No worsening back pain  REVIEW OF SYSTEMS:   Constitutional: Denies fevers, chills or abnormal weight loss Eyes: Denies blurriness of vision Ears, nose, mouth,  throat, and face: Denies mucositis or sore throat Respiratory: Denies cough, dyspnea or wheezes Cardiovascular: Denies palpitation, chest discomfort or lower extremity swelling Gastrointestinal:  Denies nausea, heartburn or change in bowel habits Lymphatics: Denies new lymphadenopathy or easy bruising Neurological:Denies numbness, tingling or new weaknesses Behavioral/Psych: Mood is stable, no new changes  All other systems were reviewed with the patient and are negative.  I have reviewed the past medical history, past surgical history, social history and family history with the patient and they are unchanged from previous note.  ALLERGIES:  is allergic to hydromorphone.  MEDICATIONS:  Current Outpatient Prescriptions  Medication Sig Dispense Refill  . acyclovir (ZOVIRAX) 400 MG tablet Take 1 tablet (400 mg total) by mouth 2 (two) times daily. 60 tablet 3  . aspirin 81 MG tablet Take 81 mg by mouth.    . cholecalciferol (VITAMIN D) 1000 UNITS tablet Take 1,000 Units by mouth daily.    . Multiple Vitamins-Minerals (ONE-A-DAY 50 PLUS PO) Take by mouth daily.    Marland Kitchen oxycodone (OXY-IR) 5 MG capsule Take 5 mg by mouth every 4 (four) hours as needed. pain    . Oxycodone HCl (OXYCONTIN) 60 MG TB12 Take 1 tablet by mouth 2 (two) times daily.    . pantoprazole (PROTONIX) 40 MG tablet TAKE (1) TABLET DAILY AS NEEDED. 30 tablet 0  . prochlorperazine (COMPAZINE) 10 MG tablet Take 1  tablet (10 mg total) by mouth every 6 (six) hours as needed (Nausea or vomiting). 30 tablet 1   No current facility-administered medications for this visit.    Facility-Administered Medications Ordered in Other Visits  Medication Dose Route Frequency Provider Last Rate Last Dose  . 0.9 %  sodium chloride infusion   Intravenous Once [name], MD      . bortezomib SQ (VELCADE) chemo injection 2 mg  1.3 mg/m2 (Treatment Plan Recorded) Subcutaneous Once [name], MD      . heparin lock flush 100 unit/mL  500 Units  Intracatheter Once PRN [name], MD      . sodium chloride flush (NS) 0.9 % injection 10 mL  10 mL Intracatheter PRN [name], MD        PHYSICAL EXAMINATION: ECOG PERFORMANCE STATUS: 1 - Symptomatic but completely ambulatory  Vitals:   02/15/16 1007  BP: 124/83  Pulse: 95  Resp: 18  Temp: 97.9 F (36.6 C)   There were no vitals filed for this visit.  GENERAL:alert, no distress and comfortable SKIN: Mild skin discoloration at prior injection sites EYES: normal, Conjunctiva are pink and non-injected, sclera clear OROPHARYNX:no exudate, no erythema and lips, buccal mucosa, and tongue normal  NECK: supple, thyroid normal size, non-tender, without nodularity LYMPH:  no palpable lymphadenopathy in the cervical, axillary or inguinal LUNGS: clear to auscultation and percussion with normal breathing effort HEART: regular rate & rhythm and no murmurs and no lower extremity edema ABDOMEN:abdomen soft, non-tender and normal bowel sounds Musculoskeletal:no cyanosis of digits and no clubbing  NEURO: alert & oriented x 3 with fluent speech, no focal motor/sensory deficits  LABORATORY DATA:  I have reviewed the data as listed    Component Value Date/Time   NA 141 02/15/2016 0840   K 3.8 02/15/2016 0840   CL 107 08/30/2012 1055   CO2 27 02/15/2016 0840   GLUCOSE 100 02/15/2016 0840   GLUCOSE 102 (H) 08/30/2012 1055   BUN 14.1 02/15/2016 0840   CREATININE 0.7 02/15/2016 0840   CALCIUM 9.1 02/15/2016 0840   PROT 6.1 (L) 02/15/2016 0840   ALBUMIN 3.8 02/15/2016 0840   AST 23 02/15/2016 0840   ALT 13 02/15/2016 0840   ALKPHOS 51 02/15/2016 0840   BILITOT 0.46 02/15/2016 0840    No results found for: SPEP, UPEP  Lab Results  Component Value Date   WBC 3.9 02/15/2016   NEUTROABS 2.4 02/15/2016   HGB 12.1 02/15/2016   HCT 36.3 02/15/2016   MCV 100.0 02/15/2016   PLT 132 (L) 02/15/2016      Chemistry      Component Value Date/Time   NA 141 02/15/2016 0840   K 3.8  02/15/2016 0840   CL 107 08/30/2012 1055   CO2 27 02/15/2016 0840   BUN 14.1 02/15/2016 0840   CREATININE 0.7 02/15/2016 0840      Component Value Date/Time   CALCIUM 9.1 02/15/2016 0840   ALKPHOS 51 02/15/2016 0840   AST 23 02/15/2016 0840   ALT 13 02/15/2016 0840   BILITOT 0.46 02/15/2016 0840      ASSESSMENT & PLAN:  Multiple myeloma in remission ([place]) I reviewed recent myeloma panel with the patient and her husband. Her recent blood work suggested VGPR She has successfully completed dexamethasone taper in November 2017 I explained to the patient and her husband the rationale behind modification of dosing.  Moving forward, she will come here every other week to get infusional treatment along with subcutaneous Velcade  The patient will receive zometa every 6 months, next due in April 2018 She will continue calcium with vitamin D and acyclovir for antimicrobial prophylaxis  Thrombocytopenia The cause is unknown. It is mild and there is little change compared from previous platelet count. The patient denies recent history of bleeding such as epistaxis, hematuria or hematochezia. She is asymptomatic from the thrombocytopenia. I will observe for now.   Drug-induced skin rash She has minus skin rash at prior Velcade injection site. Clinically, it does not look infected. I reassured the patient.   No orders of the defined types were placed in this encounter.  All questions were answered. The patient knows to call the clinic with any problems, questions or concerns. No barriers to learning was detected. I spent 15 minutes counseling the patient face to face. The total time spent in the appointment was 20 minutes and more than 50% was on counseling and review of test results     [name], MD 02/15/2016 12:29 PM

## 2016-02-18 ENCOUNTER — Encounter: Payer: Self-pay | Admitting: *Deleted

## 2016-02-18 ENCOUNTER — Telehealth: Payer: Self-pay | Admitting: *Deleted

## 2016-02-18 NOTE — Telephone Encounter (Signed)
Husband called to confirm pt is scheduled for her treatments in the "H" section Corner Chair.  Pt prefers the privacy of this chair for her treatments.  She has a lot of anxiety about cleanliness/ germs and being in the general population.  She feels more comfortable in the corner away from the larger crowd of people.   Confirmed she is scheduled in "H" section for next two treatments. He is Patent attorney.

## 2016-02-29 ENCOUNTER — Ambulatory Visit (HOSPITAL_BASED_OUTPATIENT_CLINIC_OR_DEPARTMENT_OTHER): Payer: 59

## 2016-02-29 ENCOUNTER — Other Ambulatory Visit (HOSPITAL_BASED_OUTPATIENT_CLINIC_OR_DEPARTMENT_OTHER): Payer: 59

## 2016-02-29 VITALS — BP 113/80 | HR 86 | Temp 98.2°F | Resp 18

## 2016-02-29 DIAGNOSIS — C9002 Multiple myeloma in relapse: Secondary | ICD-10-CM

## 2016-02-29 DIAGNOSIS — C9001 Multiple myeloma in remission: Secondary | ICD-10-CM | POA: Diagnosis not present

## 2016-02-29 DIAGNOSIS — Z5112 Encounter for antineoplastic immunotherapy: Secondary | ICD-10-CM

## 2016-02-29 LAB — COMPREHENSIVE METABOLIC PANEL
ALT: 17 U/L (ref 0–55)
AST: 28 U/L (ref 5–34)
Albumin: 4.3 g/dL (ref 3.5–5.0)
Alkaline Phosphatase: 57 U/L (ref 40–150)
Anion Gap: 9 mEq/L (ref 3–11)
BUN: 14.5 mg/dL (ref 7.0–26.0)
CHLORIDE: 104 meq/L (ref 98–109)
CO2: 26 mEq/L (ref 22–29)
Calcium: 9.1 mg/dL (ref 8.4–10.4)
Creatinine: 0.7 mg/dL (ref 0.6–1.1)
GLUCOSE: 92 mg/dL (ref 70–140)
POTASSIUM: 3.7 meq/L (ref 3.5–5.1)
SODIUM: 139 meq/L (ref 136–145)
Total Bilirubin: 0.45 mg/dL (ref 0.20–1.20)
Total Protein: 6.2 g/dL — ABNORMAL LOW (ref 6.4–8.3)

## 2016-02-29 LAB — CBC WITH DIFFERENTIAL/PLATELET
BASO%: 1.3 % (ref 0.0–2.0)
BASOS ABS: 0.1 10*3/uL (ref 0.0–0.1)
EOS%: 1.8 % (ref 0.0–7.0)
Eosinophils Absolute: 0.1 10*3/uL (ref 0.0–0.5)
HCT: 37.7 % (ref 34.8–46.6)
HGB: 12.5 g/dL (ref 11.6–15.9)
LYMPH%: 23.8 % (ref 14.0–49.7)
MCH: 33.3 pg (ref 25.1–34.0)
MCHC: 33.2 g/dL (ref 31.5–36.0)
MCV: 100.3 fL (ref 79.5–101.0)
MONO#: 0.4 10*3/uL (ref 0.1–0.9)
MONO%: 10.2 % (ref 0.0–14.0)
NEUT#: 2.7 10*3/uL (ref 1.5–6.5)
NEUT%: 62.9 % (ref 38.4–76.8)
Platelets: 120 10*3/uL — ABNORMAL LOW (ref 145–400)
RBC: 3.76 10*6/uL (ref 3.70–5.45)
RDW: 12.7 % (ref 11.2–14.5)
WBC: 4.3 10*3/uL (ref 3.9–10.3)
lymph#: 1 10*3/uL (ref 0.9–3.3)

## 2016-02-29 MED ORDER — PROCHLORPERAZINE MALEATE 10 MG PO TABS
ORAL_TABLET | ORAL | Status: AC
  Filled 2016-02-29: qty 1

## 2016-02-29 MED ORDER — DIPHENHYDRAMINE HCL 25 MG PO CAPS
50.0000 mg | ORAL_CAPSULE | Freq: Once | ORAL | Status: AC
  Administered 2016-02-29: 50 mg via ORAL

## 2016-02-29 MED ORDER — SODIUM CHLORIDE 0.9 % IV SOLN
900.0000 mg | Freq: Once | INTRAVENOUS | Status: AC
  Administered 2016-02-29: 900 mg via INTRAVENOUS
  Filled 2016-02-29: qty 5

## 2016-02-29 MED ORDER — SODIUM CHLORIDE 0.9 % IV SOLN
Freq: Once | INTRAVENOUS | Status: AC
  Administered 2016-02-29: 09:00:00 via INTRAVENOUS

## 2016-02-29 MED ORDER — BORTEZOMIB CHEMO SQ INJECTION 3.5 MG (2.5MG/ML)
1.3000 mg/m2 | Freq: Once | INTRAMUSCULAR | Status: AC
  Administered 2016-02-29: 2 mg via SUBCUTANEOUS
  Filled 2016-02-29: qty 2

## 2016-02-29 MED ORDER — ACETAMINOPHEN 325 MG PO TABS
650.0000 mg | ORAL_TABLET | Freq: Once | ORAL | Status: AC
  Administered 2016-02-29: 650 mg via ORAL

## 2016-02-29 MED ORDER — METHYLPREDNISOLONE SODIUM SUCC 125 MG IJ SOLR
INTRAMUSCULAR | Status: AC
  Filled 2016-02-29: qty 2

## 2016-02-29 MED ORDER — SODIUM CHLORIDE 0.9% FLUSH
10.0000 mL | INTRAVENOUS | Status: DC | PRN
  Administered 2016-02-29: 10 mL
  Filled 2016-02-29: qty 10

## 2016-02-29 MED ORDER — HEPARIN SOD (PORK) LOCK FLUSH 100 UNIT/ML IV SOLN
500.0000 [IU] | Freq: Once | INTRAVENOUS | Status: AC | PRN
  Administered 2016-02-29: 500 [IU]
  Filled 2016-02-29: qty 5

## 2016-02-29 MED ORDER — METHYLPREDNISOLONE SODIUM SUCC 125 MG IJ SOLR
125.0000 mg | Freq: Once | INTRAMUSCULAR | Status: AC
  Administered 2016-02-29: 125 mg via INTRAVENOUS

## 2016-02-29 MED ORDER — PROCHLORPERAZINE MALEATE 10 MG PO TABS
10.0000 mg | ORAL_TABLET | Freq: Once | ORAL | Status: AC
  Administered 2016-02-29: 10 mg via ORAL

## 2016-02-29 MED ORDER — DIPHENHYDRAMINE HCL 25 MG PO CAPS
ORAL_CAPSULE | ORAL | Status: AC
  Filled 2016-02-29: qty 2

## 2016-02-29 MED ORDER — ACETAMINOPHEN 325 MG PO TABS
ORAL_TABLET | ORAL | Status: AC
  Filled 2016-02-29: qty 2

## 2016-02-29 NOTE — Patient Instructions (Signed)
[facility] Discharge Instructions for Patients Receiving Chemotherapy  Today you received the following chemotherapy agents Darzalex and Velcade   To help prevent nausea and vomiting after your treatment, we encourage you to take your nausea medication as directed.    If you develop nausea and vomiting that is not controlled by your nausea medication, call the clinic.   BELOW ARE SYMPTOMS THAT SHOULD BE REPORTED IMMEDIATELY:  *FEVER GREATER THAN 100.5 F  *CHILLS WITH OR WITHOUT FEVER  NAUSEA AND VOMITING THAT IS NOT CONTROLLED WITH YOUR NAUSEA MEDICATION  *UNUSUAL SHORTNESS OF BREATH  *UNUSUAL BRUISING OR BLEEDING  TENDERNESS IN MOUTH AND THROAT WITH OR WITHOUT PRESENCE OF ULCERS  *URINARY PROBLEMS  *BOWEL PROBLEMS  UNUSUAL RASH Items with * indicate a potential emergency and should be followed up as soon as possible.  Feel free to call the clinic you have any questions or concerns. The clinic phone number is [phone].  Please show the CHEMO ALERT CARD at check-in to the Emergency Department and triage nurse.   

## 2016-03-04 LAB — KAPPA/LAMBDA LIGHT CHAINS
Ig Kappa Free Light Chain: 74.2 mg/L — ABNORMAL HIGH (ref 3.3–19.4)
Ig Lambda Free Light Chain: 2.3 mg/L — ABNORMAL LOW (ref 5.7–26.3)
KAPPA/LAMBDA FLC RATIO: 32.26 — AB (ref 0.26–1.65)

## 2016-03-05 LAB — MULTIPLE MYELOMA PANEL, SERUM
ALBUMIN/GLOB SERPL: 2.3 — AB (ref 0.7–1.7)
ALPHA 1: 0.2 g/dL (ref 0.0–0.4)
ALPHA2 GLOB SERPL ELPH-MCNC: 0.5 g/dL (ref 0.4–1.0)
Albumin SerPl Elph-Mcnc: 4 g/dL (ref 2.9–4.4)
B-GLOBULIN SERPL ELPH-MCNC: 0.7 g/dL (ref 0.7–1.3)
Gamma Glob SerPl Elph-Mcnc: 0.3 g/dL — ABNORMAL LOW (ref 0.4–1.8)
Globulin, Total: 1.8 g/dL — ABNORMAL LOW (ref 2.2–3.9)
IGG (IMMUNOGLOBIN G), SERUM: 352 mg/dL — AB (ref 700–1600)
IGM (IMMUNOGLOBIN M), SRM: 13 mg/dL — AB (ref 26–217)
IgA, Qn, Serum: 6 mg/dL — ABNORMAL LOW (ref 87–352)
M PROTEIN SERPL ELPH-MCNC: 0.1 g/dL — AB
Total Protein: 5.8 g/dL — ABNORMAL LOW (ref 6.0–8.5)

## 2016-03-06 ENCOUNTER — Telehealth: Payer: Self-pay | Admitting: Hematology and Oncology

## 2016-03-06 NOTE — Telephone Encounter (Signed)
I reviewed the myeloma panel with the patient's husband. Serum light chain is trending up. I recommend consideration of either increasing the frequency of Velcade or to discontinue Velcade and switch her to Pomalist. He will talk to the patient tonight and call me back tomorrow for further discussion

## 2016-03-07 ENCOUNTER — Telehealth: Payer: Self-pay | Admitting: Hematology and Oncology

## 2016-03-07 ENCOUNTER — Telehealth: Payer: Self-pay | Admitting: *Deleted

## 2016-03-07 ENCOUNTER — Other Ambulatory Visit: Payer: Self-pay | Admitting: Hematology and Oncology

## 2016-03-07 DIAGNOSIS — C9001 Multiple myeloma in remission: Secondary | ICD-10-CM

## 2016-03-07 MED ORDER — POMALIDOMIDE 3 MG PO CAPS: 3.0000 mg | ORAL_CAPSULE | Freq: Every day | ORAL | 11 refills | Status: DC

## 2016-03-07 NOTE — Telephone Encounter (Signed)
New Rx for Pomalyst from Dr. Alvy Bimler.  Enrolled pt in [place] and consented pt online while speaking with pt on the phone.  Pt says she thinks her insurance uses [facility] so I escribed the Pomalyst Rx to Them.  Printed Rx given to Johny Drilling, oral chemo navigator for prior auth and f/u.

## 2016-03-07 NOTE — Telephone Encounter (Signed)
I review test results with the patient over the telephone. Her recent myeloma panel suggested that her disease appears to be progressing. We discussed various options. The patient have significant problems with Velcade injection. My plan would be to discontinue Velcade and substitute with Pomalyst She agreed with the plan of care.

## 2016-03-12 ENCOUNTER — Encounter: Payer: Self-pay | Admitting: Pharmacist

## 2016-03-12 NOTE — Progress Notes (Signed)
I faxed PA request form to [facility] today on behalf of Dr. Alvy Bimler.  Kennith Center, Pharm.D., CPP 03/12/2016@11 :36 AM Oral Chemo Clinic

## 2016-03-13 ENCOUNTER — Telehealth: Payer: Self-pay | Admitting: Pharmacist

## 2016-03-13 NOTE — Telephone Encounter (Signed)
Oral Chemotherapy Pharmacist Encounter  Received notification from [facility] that prior authorization for patient's Pomalyst had been approved Approval dates: 03/12/16-03/12/17  I called [facility] [place]. [place], [place] at [phone] to alert them to re-run prescription. They were able to process script. They would not share copayment information with me.  I inquired if the pharmacy provides assistance with copayment support if needed. They do not provide this service but will help patient fill out [employer] PAP enrollment form or provide the phone number of foundations for the patient to call herself.  I left VM for patient stating that her pharmacy will be calling soon to schedule Pomalyst shipment and to reach out to oral chemo office if she needs help locating copayment support.  Oral Oncology Clinic will continue to follow.  Johny Drilling, PharmD, BCPS, BCOP 03/13/2016  12:44 PM Oral Oncology Clinic [phone]

## 2016-03-14 ENCOUNTER — Other Ambulatory Visit (HOSPITAL_BASED_OUTPATIENT_CLINIC_OR_DEPARTMENT_OTHER): Payer: 59

## 2016-03-14 ENCOUNTER — Ambulatory Visit (HOSPITAL_BASED_OUTPATIENT_CLINIC_OR_DEPARTMENT_OTHER): Payer: 59 | Admitting: Hematology and Oncology

## 2016-03-14 ENCOUNTER — Ambulatory Visit (HOSPITAL_BASED_OUTPATIENT_CLINIC_OR_DEPARTMENT_OTHER): Payer: 59

## 2016-03-14 ENCOUNTER — Telehealth: Payer: Self-pay | Admitting: Hematology and Oncology

## 2016-03-14 ENCOUNTER — Encounter: Payer: Self-pay | Admitting: Hematology and Oncology

## 2016-03-14 VITALS — BP 122/75 | HR 91 | Temp 97.9°F | Resp 18

## 2016-03-14 DIAGNOSIS — C9001 Multiple myeloma in remission: Secondary | ICD-10-CM

## 2016-03-14 DIAGNOSIS — Z5112 Encounter for antineoplastic immunotherapy: Secondary | ICD-10-CM

## 2016-03-14 DIAGNOSIS — R233 Spontaneous ecchymoses: Secondary | ICD-10-CM | POA: Insufficient documentation

## 2016-03-14 DIAGNOSIS — C9002 Multiple myeloma in relapse: Secondary | ICD-10-CM | POA: Diagnosis not present

## 2016-03-14 DIAGNOSIS — R238 Other skin changes: Secondary | ICD-10-CM

## 2016-03-14 DIAGNOSIS — D696 Thrombocytopenia, unspecified: Secondary | ICD-10-CM

## 2016-03-14 DIAGNOSIS — M549 Dorsalgia, unspecified: Secondary | ICD-10-CM

## 2016-03-14 DIAGNOSIS — G8929 Other chronic pain: Secondary | ICD-10-CM

## 2016-03-14 LAB — CBC WITH DIFFERENTIAL/PLATELET
BASO%: 0.4 % (ref 0.0–2.0)
BASOS ABS: 0 10*3/uL (ref 0.0–0.1)
EOS ABS: 0.1 10*3/uL (ref 0.0–0.5)
EOS%: 2.1 % (ref 0.0–7.0)
HEMATOCRIT: 37.6 % (ref 34.8–46.6)
HGB: 12.6 g/dL (ref 11.6–15.9)
LYMPH%: 18.1 % (ref 14.0–49.7)
MCH: 33.3 pg (ref 25.1–34.0)
MCHC: 33.5 g/dL (ref 31.5–36.0)
MCV: 99.5 fL (ref 79.5–101.0)
MONO#: 0.4 10*3/uL (ref 0.1–0.9)
MONO%: 8.7 % (ref 0.0–14.0)
NEUT#: 3.3 10*3/uL (ref 1.5–6.5)
NEUT%: 70.7 % (ref 38.4–76.8)
PLATELETS: 119 10*3/uL — AB (ref 145–400)
RBC: 3.78 10*6/uL (ref 3.70–5.45)
RDW: 12.8 % (ref 11.2–14.5)
WBC: 4.7 10*3/uL (ref 3.9–10.3)
lymph#: 0.9 10*3/uL (ref 0.9–3.3)
nRBC: 0 % (ref 0–0)

## 2016-03-14 LAB — COMPREHENSIVE METABOLIC PANEL
ALT: 16 U/L (ref 0–55)
ANION GAP: 8 meq/L (ref 3–11)
AST: 24 U/L (ref 5–34)
Albumin: 4.2 g/dL (ref 3.5–5.0)
Alkaline Phosphatase: 69 U/L (ref 40–150)
BUN: 16.3 mg/dL (ref 7.0–26.0)
CALCIUM: 8.9 mg/dL (ref 8.4–10.4)
CHLORIDE: 106 meq/L (ref 98–109)
CO2: 26 meq/L (ref 22–29)
CREATININE: 0.7 mg/dL (ref 0.6–1.1)
Glucose: 94 mg/dl (ref 70–140)
POTASSIUM: 4.2 meq/L (ref 3.5–5.1)
Sodium: 140 mEq/L (ref 136–145)
Total Bilirubin: 0.38 mg/dL (ref 0.20–1.20)
Total Protein: 6.2 g/dL — ABNORMAL LOW (ref 6.4–8.3)

## 2016-03-14 MED ORDER — DIPHENHYDRAMINE HCL 25 MG PO CAPS
ORAL_CAPSULE | ORAL | Status: AC
Start: 2016-03-14 — End: 2016-03-14
  Filled 2016-03-14: qty 2

## 2016-03-14 MED ORDER — DIPHENHYDRAMINE HCL 25 MG PO CAPS
50.0000 mg | ORAL_CAPSULE | Freq: Once | ORAL | Status: AC
  Administered 2016-03-14: 50 mg via ORAL

## 2016-03-14 MED ORDER — SODIUM CHLORIDE 0.9% FLUSH
10.0000 mL | INTRAVENOUS | Status: DC | PRN
  Administered 2016-03-14: 10 mL
  Filled 2016-03-14: qty 10

## 2016-03-14 MED ORDER — METHYLPREDNISOLONE SODIUM SUCC 125 MG IJ SOLR
125.0000 mg | Freq: Once | INTRAMUSCULAR | Status: AC
  Administered 2016-03-14: 125 mg via INTRAVENOUS

## 2016-03-14 MED ORDER — PROCHLORPERAZINE MALEATE 10 MG PO TABS
10.0000 mg | ORAL_TABLET | Freq: Once | ORAL | Status: AC
  Administered 2016-03-14: 10 mg via ORAL

## 2016-03-14 MED ORDER — SODIUM CHLORIDE 0.9 % IV SOLN
15.5000 mg/kg | Freq: Once | INTRAVENOUS | Status: AC
  Administered 2016-03-14: 900 mg via INTRAVENOUS
  Filled 2016-03-14: qty 40

## 2016-03-14 MED ORDER — PROCHLORPERAZINE MALEATE 10 MG PO TABS
ORAL_TABLET | ORAL | Status: AC
  Filled 2016-03-14: qty 1

## 2016-03-14 MED ORDER — HEPARIN SOD (PORK) LOCK FLUSH 100 UNIT/ML IV SOLN
500.0000 [IU] | Freq: Once | INTRAVENOUS | Status: AC | PRN
  Administered 2016-03-14: 500 [IU]
  Filled 2016-03-14: qty 5

## 2016-03-14 MED ORDER — METHYLPREDNISOLONE SODIUM SUCC 125 MG IJ SOLR
INTRAMUSCULAR | Status: AC
  Filled 2016-03-14: qty 2

## 2016-03-14 MED ORDER — SODIUM CHLORIDE 0.9 % IV SOLN
Freq: Once | INTRAVENOUS | Status: AC
  Administered 2016-03-14: 10:00:00 via INTRAVENOUS

## 2016-03-14 MED ORDER — ACETAMINOPHEN 325 MG PO TABS
650.0000 mg | ORAL_TABLET | Freq: Once | ORAL | Status: AC
  Administered 2016-03-14: 650 mg via ORAL

## 2016-03-14 MED ORDER — ACETAMINOPHEN 325 MG PO TABS
ORAL_TABLET | ORAL | Status: AC
  Filled 2016-03-14: qty 2

## 2016-03-14 NOTE — Patient Instructions (Signed)
[facility] Discharge Instructions for Patients Receiving Chemotherapy  Today you received the following chemotherapy agents Darzalex.  To help prevent nausea and vomiting after your treatment, we encourage you to take your nausea medication as directed.  If you develop nausea and vomiting that is not controlled by your nausea medication, call the clinic.   BELOW ARE SYMPTOMS THAT SHOULD BE REPORTED IMMEDIATELY:  *FEVER GREATER THAN 100.5 F  *CHILLS WITH OR WITHOUT FEVER  NAUSEA AND VOMITING THAT IS NOT CONTROLLED WITH YOUR NAUSEA MEDICATION  *UNUSUAL SHORTNESS OF BREATH  *UNUSUAL BRUISING OR BLEEDING  TENDERNESS IN MOUTH AND THROAT WITH OR WITHOUT PRESENCE OF ULCERS  *URINARY PROBLEMS  *BOWEL PROBLEMS  UNUSUAL RASH Items with * indicate a potential emergency and should be followed up as soon as possible.  Feel free to call the clinic you have any questions or concerns. The clinic phone number is [phone].  Please show the CHEMO ALERT CARD at check-in to the Emergency Department and triage nurse.    

## 2016-03-14 NOTE — Assessment & Plan Note (Signed)
Her back pain is drastically improved She will continue close follow-up at the pain clinic. She will continue calcium and vitamin D. 

## 2016-03-14 NOTE — Assessment & Plan Note (Signed)
I reviewed recent myeloma panel with the patient and her husband. Her recent blood work suggested mild progression She has successfully completed dexamethasone taper in November 2017 Velcade is stopped last month and we will start Pomalyst as discussed This is based on recent FDA approval and publications as follows  We discussed the role of treatment is strictly palliative  Daratumumab plus pomalidomide and dexamethasone in relapsed and/or refractory multiple myeloma Jacklynn Barnacle, Attaya [place], Angeline Slim, Ellis Parents, York Pellant, Arlan Organ. Ifthikharuddin, Pernell Dupre. Theresia Majors, Rhetta Mura Comenzo, Jianping Wang, Kerri Nottage, Audry Riles, Nushmia ZDelories Heinz, Lynetta Mare and Almyra Free  Blood 2017 :blood-[id number]; doi: [email or web address]   Daratumumab plus pomalidomide/dexamethasone (pom-dex) was evaluated in patients with relapsed/refractory multiple myeloma with ?2 prior lines of therapy, and who were refractory to their last treatment.   Patients received daratumumab 16 mg/kg at the recommended dosing schedule, pomalidomide 4 mg daily for 21 days of each 28-day cycle, and dexamethasone 40 mg weekly. Safety was the primary endpoint.   Overall response rate (ORR) and minimal residual disease (MRD) by next-generation sequencing were secondary endpoints. Patients (N = 103) received a median (range) of 4 (1-13) prior therapies; 76% received ?3 prior therapies. The safety profile of daratumumab plus pom-dex was similar to that of pom-dex alone, with the exception of daratumumab-specific infusion-related reactions (50%) and a higher incidence of neutropenia, although without an increase in infection rate. Common grade ?3 adverse events were neutropenia (78%), anemia (28%), and leukopenia (24%). ORR was 60% and was generally consistent across subgroups (58% in double-refractory patients).   Among patients  with a complete response or better, 29% were MRD-negative at a threshold of 10-5. Among the 62 responders, median duration of response was not estimable (NE; 95% CI, 13.6-NE). At a median follow-up of 13.1 months, the median progression-free survival was 8.8 (95% CI, 4.6-15.4) months and median overall survival was 17.5 (95% CI, 13.3-NE) months. The estimated 70-monthsurvival rate was 66% (95% CI, 55.6-74.8). Aside from increased neutropenia, the safety profile of daratumumab plus pom-dex was consistent with that of the individual therapies. Deep, durable responses were observed in heavily treated patients  Infusion reactions are common side effects.  Some of the short term side-effects included, though not limited to, risk of fatigue, weight loss, tumor lysis syndrome, risk of allergic reactions, pancytopenia, risk of blood clots, life-threatening infections, need for transfusions of blood products, admission to hospital for various reasons, and risks of death.   The patient is aware that the response rates discussed earlier is not guaranteed.    After a long discussion, patient made an informed decision to proceed with the prescribed plan of care.   The patient will receive zometa every 6 months, next due in April 2018 She will continue calcium with vitamin D and acyclovir for antimicrobial prophylaxis I plan to bring her back in 11 days for blood count monitoring and toxicity review

## 2016-03-14 NOTE — Assessment & Plan Note (Signed)
The cause is unknown. It is mild and there is little change compared from previous platelet count. The patient denies recent history of bleeding such as epistaxis, hematuria or hematochezia. She is asymptomatic from the thrombocytopenia. I will observe for now.  

## 2016-03-14 NOTE — Telephone Encounter (Signed)
Appointments scheduled per 1/5 LOS. Patient given AVS report and calendars with future scheduled appointments. °

## 2016-03-14 NOTE — Assessment & Plan Note (Signed)
Recent injection site is healing Recommend conservative management

## 2016-03-14 NOTE — Progress Notes (Signed)
[facility] OFFICE PROGRESS NOTE  Patient Care Team: [name], MD as PCP - General [name], MD as Consulting Physician (Oncology) [name], MD as Consulting Physician (Neurosurgery) [name], MD as Consulting Physician (Hematology and Oncology)  SUMMARY OF ONCOLOGIC HISTORY:   Multiple myeloma in remission [facility])   07/21/2007 Bone Marrow Biopsy    Case #: [id number] Bone marrow showed myeloma      07/21/2007 Miscellaneous    She was diagnosed in May 2009. Had several cycles of RVD      10/14/2007 Bone Marrow Biopsy    Case #: [id number] repeat bone marrow biopsy showed good response to Rx      12/31/2007 Bone Marrow Transplant    She had autologous BMT at [facility]      07/19/2009 Bone Marrow Biopsy    Case #: [id number] Bone marrow biopsy showed only 1% plasma cells      10/10/2010 Bone Marrow Biopsy    [id number] Bone marrow biopsy showed 2% plasma cells      09/25/2011 Bone Marrow Biopsy    Accession #: [id number] Bone marrow only showed 4% plasma cells with ligh chain excess      10/14/2011 - 06/13/2013 Chemotherapy    She received Revlimid only, discontinued due to pancytopenia      06/18/2015 - 09/13/2015 Chemotherapy    She resumed taking Revlimid and dexamethasone. Treatment is stopped due to minimum response and pancytopenia      10/19/2015 -  Chemotherapy    The patient had Velcade for chemotherapy treatment along with weekly Daratumumab. Last dose of Velcade on 02/29/16, stopped due to progression. Pomalidomide added on 03/14/16       INTERVAL HISTORY: Please see below for problem oriented charting. She is seen in the treatment room She has recent excessive bruising from Velcade, it is healing She described another cyst on the skin, resolved The patient denies any recent signs or symptoms of bleeding such as spontaneous epistaxis, hematuria or hematochezia. She denies recent infection  REVIEW OF SYSTEMS:   Constitutional:  Denies fevers, chills or abnormal weight loss Eyes: Denies blurriness of vision Ears, nose, mouth, throat, and face: Denies mucositis or sore throat Respiratory: Denies cough, dyspnea or wheezes Cardiovascular: Denies palpitation, chest discomfort or lower extremity swelling Gastrointestinal:  Denies nausea, heartburn or change in bowel habits Lymphatics: Denies new lymphadenopathy or easy bruising Neurological:Denies numbness, tingling or new weaknesses Behavioral/Psych: Mood is stable, no new changes  All other systems were reviewed with the patient and are negative.  I have reviewed the past medical history, past surgical history, social history and family history with the patient and they are unchanged from previous note.  ALLERGIES:  is allergic to hydromorphone.  MEDICATIONS:  Current Outpatient Prescriptions  Medication Sig Dispense Refill  . aspirin 81 MG tablet Take 81 mg by mouth.    . cholecalciferol (VITAMIN D) 1000 UNITS tablet Take 1,000 Units by mouth daily.    . Multiple Vitamins-Minerals (ONE-A-DAY 50 PLUS PO) Take by mouth daily.    Marland Kitchen oxycodone (OXY-IR) 5 MG capsule Take 5 mg by mouth every 4 (four) hours as needed. pain    . Oxycodone HCl (OXYCONTIN) 60 MG TB12 Take 1 tablet by mouth 2 (two) times daily.    . pantoprazole (PROTONIX) 40 MG tablet TAKE (1) TABLET DAILY AS NEEDED. 30 tablet 0  . pomalidomide (POMALYST) 3 MG capsule Take 1 capsule (3 mg total) by mouth daily. Take with water on  days 1-21. Repeat every 28 days. 21 capsule 11   No current facility-administered medications for this visit.     PHYSICAL EXAMINATION: ECOG PERFORMANCE STATUS: 1 - Symptomatic but completely ambulatory GENERAL:alert, no distress and comfortable SKIN: noted minor skin bruising EYES: normal, Conjunctiva are pink and non-injected, sclera clear Musculoskeletal:no cyanosis of digits and no clubbing  NEURO: alert & oriented x 3 with fluent speech, no focal motor/sensory  deficits  LABORATORY DATA:  I have reviewed the data as listed    Component Value Date/Time   NA 140 03/14/2016 0901   K 4.2 03/14/2016 0901   CL 107 08/30/2012 1055   CO2 26 03/14/2016 0901   GLUCOSE 94 03/14/2016 0901   GLUCOSE 102 (H) 08/30/2012 1055   BUN 16.3 03/14/2016 0901   CREATININE 0.7 03/14/2016 0901   CALCIUM 8.9 03/14/2016 0901   PROT 6.2 (L) 03/14/2016 0901   ALBUMIN 4.2 03/14/2016 0901   AST 24 03/14/2016 0901   ALT 16 03/14/2016 0901   ALKPHOS 69 03/14/2016 0901   BILITOT 0.38 03/14/2016 0901    No results found for: SPEP, UPEP  Lab Results  Component Value Date   WBC 4.7 03/14/2016   NEUTROABS 3.3 03/14/2016   HGB 12.6 03/14/2016   HCT 37.6 03/14/2016   MCV 99.5 03/14/2016   PLT 119 (L) 03/14/2016      Chemistry      Component Value Date/Time   NA 140 03/14/2016 0901   K 4.2 03/14/2016 0901   CL 107 08/30/2012 1055   CO2 26 03/14/2016 0901   BUN 16.3 03/14/2016 0901   CREATININE 0.7 03/14/2016 0901      Component Value Date/Time   CALCIUM 8.9 03/14/2016 0901   ALKPHOS 69 03/14/2016 0901   AST 24 03/14/2016 0901   ALT 16 03/14/2016 0901   BILITOT 0.38 03/14/2016 0901      ASSESSMENT & PLAN:  Multiple myeloma in remission ([place]) I reviewed recent myeloma panel with the patient and her husband. Her recent blood work suggested mild progression She has successfully completed dexamethasone taper in November 2017 Velcade is stopped last month and we will start Pomalyst as discussed This is based on recent FDA approval and publications as follows  We discussed the role of treatment is strictly palliative  Daratumumab plus pomalidomide and dexamethasone in relapsed and/or refractory multiple myeloma Jacklynn Barnacle, Attaya [place], Angeline Slim, Ellis Parents, York Pellant, Arlan Organ. Ifthikharuddin, Pernell Dupre. Theresia Majors, Rhetta Mura Comenzo, Jianping Wang, Kerri Nottage, Audry Riles, Nushmia ZDelories Heinz, Lynetta Mare and Almyra Free  Blood 2017 :blood-[id number]; doi: [email or web address]   Daratumumab plus pomalidomide/dexamethasone (pom-dex) was evaluated in patients with relapsed/refractory multiple myeloma with ?2 prior lines of therapy, and who were refractory to their last treatment.   Patients received daratumumab 16 mg/kg at the recommended dosing schedule, pomalidomide 4 mg daily for 21 days of each 28-day cycle, and dexamethasone 40 mg weekly. Safety was the primary endpoint.   Overall response rate (ORR) and minimal residual disease (MRD) by next-generation sequencing were secondary endpoints. Patients (N = 103) received a median (range) of 4 (1-13) prior therapies; 76% received ?3 prior therapies. The safety profile of daratumumab plus pom-dex was similar to that of pom-dex alone, with the exception of daratumumab-specific infusion-related reactions (50%) and a higher incidence of neutropenia, although without an increase in infection rate. Common grade ?3 adverse events were neutropenia (78%), anemia (28%), and leukopenia (24%). ORR was 60% and was generally consistent  across subgroups (58% in double-refractory patients).   Among patients with a complete response or better, 29% were MRD-negative at a threshold of 10-5. Among the 62 responders, median duration of response was not estimable (NE; 95% CI, 13.6-NE). At a median follow-up of 13.1 months, the median progression-free survival was 8.8 (95% CI, 4.6-15.4) months and median overall survival was 17.5 (95% CI, 13.3-NE) months. The estimated 63-monthsurvival rate was 66% (95% CI, 55.6-74.8). Aside from increased neutropenia, the safety profile of daratumumab plus pom-dex was consistent with that of the individual therapies. Deep, durable responses were observed in heavily treated patients  Infusion reactions are common side effects.  Some of the short term side-effects included, though not limited  to, risk of fatigue, weight loss, tumor lysis syndrome, risk of allergic reactions, pancytopenia, risk of blood clots, life-threatening infections, need for transfusions of blood products, admission to hospital for various reasons, and risks of death.   The patient is aware that the response rates discussed earlier is not guaranteed.    After a long discussion, patient made an informed decision to proceed with the prescribed plan of care.   The patient will receive zometa every 6 months, next due in April 2018 She will continue calcium with vitamin D and acyclovir for antimicrobial prophylaxis I plan to bring her back in 11 days for blood count monitoring and toxicity review  Thrombocytopenia The cause is unknown. It is mild and there is little change compared from previous platelet count. The patient denies recent history of bleeding such as epistaxis, hematuria or hematochezia. She is asymptomatic from the thrombocytopenia. I will observe for now.   Chronic back pain greater than 3 months duration Her back pain is drastically improved She will continue close follow-up at the pain clinic. She will continue calcium and vitamin D.  Abnormal bruising Recent injection site is healing Recommend conservative management   No orders of the defined types were placed in this encounter.  All questions were answered. The patient knows to call the clinic with any problems, questions or concerns. No barriers to learning was detected. I spent 20 minutes counseling the patient face to face. The total time spent in the appointment was 25 minutes and more than 50% was on counseling and review of test results     NHeath Lark MD 03/14/2016 10:07 PM

## 2016-03-18 ENCOUNTER — Telehealth: Payer: Self-pay

## 2016-03-18 NOTE — Telephone Encounter (Signed)
On 03/18/16 [facility] to the status of Ms Males's Pomalyst.  Medication was  Shipped out on 03/13/16 [facility] [place] at [phone] on 03/14/16.  [facility] [place] to check [facility] up status, they did not have the prescription at the store and are unsure if picked up since it is a mail to them.  I have left a voice message with the [place] residence to have them contact [place] to confirm pick up and answer any questions.  Thank you,  Henreitta Leber, PharmD Oral Oncology Navigation Clinic

## 2016-03-20 ENCOUNTER — Other Ambulatory Visit: Payer: Self-pay | Admitting: Hematology and Oncology

## 2016-03-21 NOTE — Telephone Encounter (Signed)
Oral Chemotherapy Pharmacist Encounter  Received notification from [employer] that patient has been successfully enrolled in their commercial co-pay program 03/13/16-03/09/17. Patient will be responsible for copayment of $25 per Pomalyst fill and [employer] will cover the remainder of patient's copay upto a maximum benefit of $10,000.  Johny Drilling, PharmD, BCPS, BCOP 03/21/2016  9:37 AM Oral Oncology Clinic [phone]

## 2016-03-24 ENCOUNTER — Other Ambulatory Visit (HOSPITAL_BASED_OUTPATIENT_CLINIC_OR_DEPARTMENT_OTHER): Payer: 59

## 2016-03-24 ENCOUNTER — Ambulatory Visit (HOSPITAL_BASED_OUTPATIENT_CLINIC_OR_DEPARTMENT_OTHER): Payer: 59 | Admitting: Hematology and Oncology

## 2016-03-24 ENCOUNTER — Encounter: Payer: Self-pay | Admitting: Hematology and Oncology

## 2016-03-24 ENCOUNTER — Telehealth: Payer: Self-pay | Admitting: Hematology and Oncology

## 2016-03-24 DIAGNOSIS — C9001 Multiple myeloma in remission: Secondary | ICD-10-CM

## 2016-03-24 DIAGNOSIS — D696 Thrombocytopenia, unspecified: Secondary | ICD-10-CM

## 2016-03-24 LAB — CBC WITH DIFFERENTIAL/PLATELET
BASO%: 0.7 % (ref 0.0–2.0)
BASOS ABS: 0 10*3/uL (ref 0.0–0.1)
EOS ABS: 0.2 10*3/uL (ref 0.0–0.5)
EOS%: 3.8 % (ref 0.0–7.0)
HEMATOCRIT: 39.8 % (ref 34.8–46.6)
HGB: 13.1 g/dL (ref 11.6–15.9)
LYMPH%: 13.2 % — AB (ref 14.0–49.7)
MCH: 33.3 pg (ref 25.1–34.0)
MCHC: 32.9 g/dL (ref 31.5–36.0)
MCV: 101.3 fL — AB (ref 79.5–101.0)
MONO#: 0.5 10*3/uL (ref 0.1–0.9)
MONO%: 10.2 % (ref 0.0–14.0)
NEUT#: 3.3 10*3/uL (ref 1.5–6.5)
NEUT%: 72.1 % (ref 38.4–76.8)
PLATELETS: 114 10*3/uL — AB (ref 145–400)
RBC: 3.93 10*6/uL (ref 3.70–5.45)
RDW: 12.7 % (ref 11.2–14.5)
WBC: 4.5 10*3/uL (ref 3.9–10.3)
lymph#: 0.6 10*3/uL — ABNORMAL LOW (ref 0.9–3.3)

## 2016-03-24 LAB — COMPREHENSIVE METABOLIC PANEL
ALBUMIN: 3.9 g/dL (ref 3.5–5.0)
ALK PHOS: 59 U/L (ref 40–150)
ALT: 16 U/L (ref 0–55)
ANION GAP: 8 meq/L (ref 3–11)
AST: 21 U/L (ref 5–34)
BILIRUBIN TOTAL: 0.36 mg/dL (ref 0.20–1.20)
BUN: 12.5 mg/dL (ref 7.0–26.0)
CO2: 28 mEq/L (ref 22–29)
Calcium: 9 mg/dL (ref 8.4–10.4)
Chloride: 106 mEq/L (ref 98–109)
Creatinine: 0.8 mg/dL (ref 0.6–1.1)
EGFR: 89 mL/min/{1.73_m2} — AB (ref >90)
Glucose: 81 mg/dl (ref 70–140)
Potassium: 4.5 mEq/L (ref 3.5–5.1)
Sodium: 142 mEq/L (ref 136–145)
Total Protein: 6 g/dL — ABNORMAL LOW (ref 6.4–8.3)

## 2016-03-24 NOTE — Progress Notes (Signed)
[place] OFFICE PROGRESS NOTE  Patient Care Team: [name], MD as PCP - General [name], MD as Consulting Physician (Oncology) [name], MD as Consulting Physician (Neurosurgery) [name], MD as Consulting Physician (Hematology and Oncology)  SUMMARY OF ONCOLOGIC HISTORY:   Multiple myeloma in remission [facility])   07/21/2007 Bone Marrow Biopsy    Case #: [id number] Bone marrow showed myeloma      07/21/2007 Miscellaneous    She was diagnosed in May 2009. Had several cycles of RVD      10/14/2007 Bone Marrow Biopsy    Case #: [id number] repeat bone marrow biopsy showed good response to Rx      12/31/2007 Bone Marrow Transplant    She had autologous BMT at [facility]      07/19/2009 Bone Marrow Biopsy    Case #: [id number] Bone marrow biopsy showed only 1% plasma cells      10/10/2010 Bone Marrow Biopsy    [id number] Bone marrow biopsy showed 2% plasma cells      09/25/2011 Bone Marrow Biopsy    Accession #: [id number] Bone marrow only showed 4% plasma cells with ligh chain excess      10/14/2011 - 06/13/2013 Chemotherapy    She received Revlimid only, discontinued due to pancytopenia      06/18/2015 - 09/13/2015 Chemotherapy    She resumed taking Revlimid and dexamethasone. Treatment is stopped due to minimum response and pancytopenia      10/19/2015 -  Chemotherapy    The patient had Velcade for chemotherapy treatment along with weekly Daratumumab. Last dose of Velcade on 02/29/16, stopped due to progression. Pomalidomide added on 03/14/16       INTERVAL HISTORY: Please see below for problem oriented charting. She returns for follow-up with her husband She tolerated Pomalyst well without major side effects Except for fatigue Her bone pain remain the same Denies recent nausea, vomiting or diarrhea The patient denies any recent signs or symptoms of bleeding such as spontaneous epistaxis, hematuria or hematochezia. No recent infection Her  recent bruising from prior injections are resolving  REVIEW OF SYSTEMS:   Constitutional: Denies fevers, chills or abnormal weight loss Eyes: Denies blurriness of vision Ears, nose, mouth, throat, and face: Denies mucositis or sore throat Respiratory: Denies cough, dyspnea or wheezes Cardiovascular: Denies palpitation, chest discomfort or lower extremity swelling Gastrointestinal:  Denies nausea, heartburn or change in bowel habits Skin: Denies abnormal skin rashes Lymphatics: Denies new lymphadenopathy  Neurological:Denies numbness, tingling or new weaknesses Behavioral/Psych: Mood is stable, no new changes  All other systems were reviewed with the patient and are negative.  I have reviewed the past medical history, past surgical history, social history and family history with the patient and they are unchanged from previous note.  ALLERGIES:  is allergic to hydromorphone.  MEDICATIONS:  Current Outpatient Prescriptions  Medication Sig Dispense Refill  . acyclovir (ZOVIRAX) 400 MG tablet TAKE 1 TABLET TWICE DAILY. 60 tablet 0  . aspirin 81 MG tablet Take 81 mg by mouth.    . cholecalciferol (VITAMIN D) 1000 UNITS tablet Take 1,000 Units by mouth daily.    . Multiple Vitamins-Minerals (ONE-A-DAY 50 PLUS PO) Take by mouth daily.    Marland Kitchen oxycodone (OXY-IR) 5 MG capsule Take 5 mg by mouth every 4 (four) hours as needed. pain    . Oxycodone HCl (OXYCONTIN) 60 MG TB12 Take 1 tablet by mouth 2 (two) times daily.    . pantoprazole (PROTONIX) 40 MG  tablet TAKE (1) TABLET DAILY AS NEEDED. 30 tablet 0  . pomalidomide (POMALYST) 3 MG capsule Take 1 capsule (3 mg total) by mouth daily. Take with water on days 1-21. Repeat every 28 days. 21 capsule 11   No current facility-administered medications for this visit.     PHYSICAL EXAMINATION: ECOG PERFORMANCE STATUS: 1 - Symptomatic but completely ambulatory  Vitals:   03/24/16 1336  BP: 128/67  Pulse: 79  Resp: 18  Temp: 97.8 F (36.6 C)    Filed Weights   03/24/16 1336  Weight: 126 lb 6.4 oz (57.3 kg)    GENERAL:alert, no distress and comfortable SKIN: skin color, texture, turgor are normal, no rashes or significant lesions. Prior bruising from injection is resolving EYES: normal, Conjunctiva are pink and non-injected, sclera clear OROPHARYNX:no exudate, no erythema and lips, buccal mucosa, and tongue normal  NECK: supple, thyroid normal size, non-tender, without nodularity LYMPH:  no palpable lymphadenopathy in the cervical, axillary or inguinal LUNGS: clear to auscultation and percussion with normal breathing effort HEART: regular rate & rhythm and no murmurs and no lower extremity edema ABDOMEN:abdomen soft, non-tender and normal bowel sounds Musculoskeletal:no cyanosis of digits and no clubbing  NEURO: alert & oriented x 3 with fluent speech, no focal motor/sensory deficits  LABORATORY DATA:  I have reviewed the data as listed    Component Value Date/Time   NA 140 03/14/2016 0901   K 4.2 03/14/2016 0901   CL 107 08/30/2012 1055   CO2 26 03/14/2016 0901   GLUCOSE 94 03/14/2016 0901   GLUCOSE 102 (H) 08/30/2012 1055   BUN 16.3 03/14/2016 0901   CREATININE 0.7 03/14/2016 0901   CALCIUM 8.9 03/14/2016 0901   PROT 6.2 (L) 03/14/2016 0901   ALBUMIN 4.2 03/14/2016 0901   AST 24 03/14/2016 0901   ALT 16 03/14/2016 0901   ALKPHOS 69 03/14/2016 0901   BILITOT 0.38 03/14/2016 0901    No results found for: SPEP, UPEP  Lab Results  Component Value Date   WBC 4.5 03/24/2016   NEUTROABS 3.3 03/24/2016   HGB 13.1 03/24/2016   HCT 39.8 03/24/2016   MCV 101.3 (H) 03/24/2016   PLT 114 (L) 03/24/2016      Chemistry      Component Value Date/Time   NA 140 03/14/2016 0901   K 4.2 03/14/2016 0901   CL 107 08/30/2012 1055   CO2 26 03/14/2016 0901   BUN 16.3 03/14/2016 0901   CREATININE 0.7 03/14/2016 0901      Component Value Date/Time   CALCIUM 8.9 03/14/2016 0901   ALKPHOS 69 03/14/2016 0901   AST 24  03/14/2016 0901   ALT 16 03/14/2016 0901   BILITOT 0.38 03/14/2016 0901      ASSESSMENT & PLAN:  Multiple myeloma in remission ([place]) I reviewed recent myeloma panel with the patient and her husband. Her recent blood work suggested mild progression She was started on, list and overall tolerated treatment well except for some fatigue She has successfully completed dexamethasone taper in November 2017 Velcade is stopped last month and we will start Pomalyst as discussed on 03/14/16 The patient will receive zometa every 6 months, next due in April 2018 She will continue calcium with vitamin D and acyclovir for antimicrobial prophylaxis I will see her back again early next month for further toxicity review  Thrombocytopenia The cause is unknown. It is mild and there is little change compared from previous platelet count. The patient denies recent history of bleeding such as epistaxis,  hematuria or hematochezia. She is asymptomatic from the thrombocytopenia. I will observe for now.  There is no contraindication to remain on antiplatelet agents or anticoagulants as long as the platelet is greater than 50,000.   Orders Placed This Encounter  Procedures  . Kappa/lambda light chains    Standing Status:   Future    Standing Expiration Date:   04/28/2017  . Multiple Myeloma Panel (SPEP&IFE w/QIG)    Standing Status:   Future    Standing Expiration Date:   04/28/2017   All questions were answered. The patient knows to call the clinic with any problems, questions or concerns. No barriers to learning was detected. I spent 15 minutes counseling the patient face to face. The total time spent in the appointment was 20 minutes and more than 50% was on counseling and review of test results     [name], MD 03/24/2016 2:03 PM

## 2016-03-24 NOTE — Assessment & Plan Note (Signed)
The cause is unknown. It is mild and there is little change compared from previous platelet count. The patient denies recent history of bleeding such as epistaxis, hematuria or hematochezia. She is asymptomatic from the thrombocytopenia. I will observe for now.  There is no contraindication to remain on antiplatelet agents or anticoagulants as long as the platelet is greater than 50,000.

## 2016-03-24 NOTE — Telephone Encounter (Signed)
GAVE PATIENT AVS REPORT AND APPOINTMENTS FOR February AND MARCH

## 2016-03-24 NOTE — Assessment & Plan Note (Addendum)
I reviewed recent myeloma panel with the patient and her husband. Her recent blood work suggested mild progression She was started on, list and overall tolerated treatment well except for some fatigue She has successfully completed dexamethasone taper in November 2017 Velcade is stopped last month and we will start Pomalyst as discussed on 03/14/16 The patient will receive zometa every 6 months, next due in April 2018 She will continue calcium with vitamin D and acyclovir for antimicrobial prophylaxis I will see her back again early next month for further toxicity review

## 2016-03-31 ENCOUNTER — Other Ambulatory Visit: Payer: Self-pay | Admitting: Hematology and Oncology

## 2016-03-31 NOTE — Telephone Encounter (Signed)
Please refill electronically 

## 2016-04-02 ENCOUNTER — Telehealth: Payer: Self-pay | Admitting: Medical Oncology

## 2016-04-02 NOTE — Telephone Encounter (Signed)
confirming appts. . Please call husband about her appts.

## 2016-04-03 ENCOUNTER — Telehealth: Payer: Self-pay | Admitting: Hematology and Oncology

## 2016-04-03 NOTE — Telephone Encounter (Signed)
Per 1/24 staff message from collaborative nurse. Infusion duration on 3/2 changed from blood to chemo and from 5hrs to 8hr and moved to an earlier time. Also per staff message moved appointments to an earlier time and NG will see patient in infusion area.   Appointments adjusted accordingly and patient will be given new schedule at 2/2 office visit. Patient also my chart active.

## 2016-04-11 ENCOUNTER — Other Ambulatory Visit (HOSPITAL_BASED_OUTPATIENT_CLINIC_OR_DEPARTMENT_OTHER): Payer: 59

## 2016-04-11 ENCOUNTER — Ambulatory Visit (HOSPITAL_BASED_OUTPATIENT_CLINIC_OR_DEPARTMENT_OTHER): Payer: 59

## 2016-04-11 ENCOUNTER — Ambulatory Visit (HOSPITAL_BASED_OUTPATIENT_CLINIC_OR_DEPARTMENT_OTHER): Payer: 59 | Admitting: Hematology and Oncology

## 2016-04-11 ENCOUNTER — Encounter: Payer: Self-pay | Admitting: Hematology and Oncology

## 2016-04-11 ENCOUNTER — Other Ambulatory Visit: Payer: Self-pay | Admitting: Hematology and Oncology

## 2016-04-11 VITALS — BP 122/83 | HR 89 | Temp 97.5°F | Resp 16

## 2016-04-11 DIAGNOSIS — C9001 Multiple myeloma in remission: Secondary | ICD-10-CM

## 2016-04-11 DIAGNOSIS — Z5112 Encounter for antineoplastic immunotherapy: Secondary | ICD-10-CM | POA: Diagnosis not present

## 2016-04-11 DIAGNOSIS — D701 Agranulocytosis secondary to cancer chemotherapy: Secondary | ICD-10-CM

## 2016-04-11 DIAGNOSIS — D702 Other drug-induced agranulocytosis: Secondary | ICD-10-CM

## 2016-04-11 LAB — CBC WITH DIFFERENTIAL/PLATELET
BASO%: 4.7 % — ABNORMAL HIGH (ref 0.0–2.0)
BASOS ABS: 0.1 10*3/uL (ref 0.0–0.1)
EOS ABS: 0.2 10*3/uL (ref 0.0–0.5)
EOS%: 5.7 % (ref 0.0–7.0)
HCT: 36.5 % (ref 34.8–46.6)
HGB: 13 g/dL (ref 11.6–15.9)
LYMPH%: 30.4 % (ref 14.0–49.7)
MCH: 33.4 pg (ref 25.1–34.0)
MCHC: 35.6 g/dL (ref 31.5–36.0)
MCV: 93.8 fL (ref 79.5–101.0)
MONO#: 0.7 10*3/uL (ref 0.1–0.9)
MONO%: 24 % — AB (ref 0.0–14.0)
NEUT#: 1 10*3/uL — ABNORMAL LOW (ref 1.5–6.5)
NEUT%: 35.2 % — AB (ref 38.4–76.8)
Platelets: 160 10*3/uL (ref 145–400)
RBC: 3.89 10*6/uL (ref 3.70–5.45)
RDW: 12.6 % (ref 11.2–14.5)
WBC: 3 10*3/uL — ABNORMAL LOW (ref 3.9–10.3)
lymph#: 0.9 10*3/uL (ref 0.9–3.3)

## 2016-04-11 LAB — COMPREHENSIVE METABOLIC PANEL
ALK PHOS: 60 U/L (ref 40–150)
ALT: 18 U/L (ref 0–55)
AST: 23 U/L (ref 5–34)
Albumin: 4.1 g/dL (ref 3.5–5.0)
Anion Gap: 8 mEq/L (ref 3–11)
BUN: 17 mg/dL (ref 7.0–26.0)
CALCIUM: 9.6 mg/dL (ref 8.4–10.4)
CHLORIDE: 104 meq/L (ref 98–109)
CO2: 29 mEq/L (ref 22–29)
Creatinine: 0.7 mg/dL (ref 0.6–1.1)
GLUCOSE: 100 mg/dL (ref 70–140)
Potassium: 4.2 mEq/L (ref 3.5–5.1)
SODIUM: 141 meq/L (ref 136–145)
Total Bilirubin: 0.31 mg/dL (ref 0.20–1.20)
Total Protein: 6.2 g/dL — ABNORMAL LOW (ref 6.4–8.3)

## 2016-04-11 MED ORDER — DIPHENHYDRAMINE HCL 25 MG PO CAPS
ORAL_CAPSULE | ORAL | Status: AC
  Filled 2016-04-11: qty 2

## 2016-04-11 MED ORDER — PROCHLORPERAZINE MALEATE 10 MG PO TABS
ORAL_TABLET | ORAL | Status: AC
  Filled 2016-04-11: qty 1

## 2016-04-11 MED ORDER — HEPARIN SOD (PORK) LOCK FLUSH 100 UNIT/ML IV SOLN
500.0000 [IU] | Freq: Once | INTRAVENOUS | Status: AC | PRN
  Administered 2016-04-11: 500 [IU]
  Filled 2016-04-11: qty 5

## 2016-04-11 MED ORDER — SODIUM CHLORIDE 0.9 % IV SOLN
Freq: Once | INTRAVENOUS | Status: AC
  Administered 2016-04-11: 10:00:00 via INTRAVENOUS

## 2016-04-11 MED ORDER — DIPHENHYDRAMINE HCL 25 MG PO CAPS
50.0000 mg | ORAL_CAPSULE | Freq: Once | ORAL | Status: AC
  Administered 2016-04-11: 50 mg via ORAL

## 2016-04-11 MED ORDER — PROCHLORPERAZINE MALEATE 10 MG PO TABS
10.0000 mg | ORAL_TABLET | Freq: Once | ORAL | Status: AC
  Administered 2016-04-11: 10 mg via ORAL

## 2016-04-11 MED ORDER — SODIUM CHLORIDE 0.9% FLUSH
10.0000 mL | INTRAVENOUS | Status: DC | PRN
  Administered 2016-04-11: 10 mL
  Filled 2016-04-11: qty 10

## 2016-04-11 MED ORDER — PANTOPRAZOLE SODIUM 40 MG PO TBEC: 40.0000 mg | DELAYED_RELEASE_TABLET | Freq: Every day | ORAL | 9 refills | Status: DC

## 2016-04-11 MED ORDER — ACETAMINOPHEN 325 MG PO TABS
ORAL_TABLET | ORAL | Status: AC
  Filled 2016-04-11: qty 2

## 2016-04-11 MED ORDER — DARATUMUMAB CHEMO INJECTION 400 MG/20ML
15.5000 mg/kg | Freq: Once | INTRAVENOUS | Status: AC
  Administered 2016-04-11: 900 mg via INTRAVENOUS
  Filled 2016-04-11: qty 40

## 2016-04-11 MED ORDER — ACETAMINOPHEN 325 MG PO TABS
650.0000 mg | ORAL_TABLET | Freq: Once | ORAL | Status: AC
  Administered 2016-04-11: 650 mg via ORAL

## 2016-04-11 MED ORDER — METHYLPREDNISOLONE SODIUM SUCC 125 MG IJ SOLR
125.0000 mg | Freq: Once | INTRAMUSCULAR | Status: AC
  Administered 2016-04-11: 125 mg via INTRAVENOUS

## 2016-04-11 MED ORDER — METHYLPREDNISOLONE SODIUM SUCC 125 MG IJ SOLR
INTRAMUSCULAR | Status: AC
  Filled 2016-04-11: qty 2

## 2016-04-11 NOTE — Assessment & Plan Note (Signed)
Repeat myeloma panel is pending She has successfully completed dexamethasone taper in November 2017 Velcade is stopped in December and she started taking Pomalyst as discussed on 03/14/16 The patient will receive zometa every 6 months, next due in April 2018 She will continue calcium with vitamin D and acyclovir for antimicrobial prophylaxis With neutropenia, I recommend do not restart Pomalyst until 04/16/2016 I will see her back again early next month for further toxicity review

## 2016-04-11 NOTE — Assessment & Plan Note (Signed)
This is related to Pomalyst. I will resume Daratumumab and recommend delay resumption of Pomalyst until next week

## 2016-04-11 NOTE — Progress Notes (Signed)
[place] OFFICE PROGRESS NOTE  Patient Care Team: [name], MD as PCP - General [name], MD as Consulting Physician (Oncology) [name], MD as Consulting Physician (Neurosurgery) [name], MD as Consulting Physician (Hematology and Oncology)  SUMMARY OF ONCOLOGIC HISTORY:   Multiple myeloma in remission [facility])   07/21/2007 Bone Marrow Biopsy    Case #: [id number] Bone marrow showed myeloma      07/21/2007 Miscellaneous    She was diagnosed in May 2009. Had several cycles of RVD      10/14/2007 Bone Marrow Biopsy    Case #: [id number] repeat bone marrow biopsy showed good response to Rx      12/31/2007 Bone Marrow Transplant    She had autologous BMT at [facility]      07/19/2009 Bone Marrow Biopsy    Case #: [id number] Bone marrow biopsy showed only 1% plasma cells      10/10/2010 Bone Marrow Biopsy    [id number] Bone marrow biopsy showed 2% plasma cells      09/25/2011 Bone Marrow Biopsy    Accession #: [id number] Bone marrow only showed 4% plasma cells with ligh chain excess      10/14/2011 - 06/13/2013 Chemotherapy    She received Revlimid only, discontinued due to pancytopenia      06/18/2015 - 09/13/2015 Chemotherapy    She resumed taking Revlimid and dexamethasone. Treatment is stopped due to minimum response and pancytopenia      10/19/2015 -  Chemotherapy    The patient had Velcade for chemotherapy treatment along with weekly Daratumumab. Last dose of Velcade on 02/29/16, stopped due to progression. Pomalidomide added on 03/14/16      04/11/2016 Adverse Reaction    Delay resumption of Pomalyst cycle 2 until 04/16/16 due to neutropenia       INTERVAL HISTORY: Please see below for problem oriented charting. She is seen in the infusion area She tolerated chemotherapy well. Denies recent infection. Skin bruising from prior treatment has resolved. She denies worsening bone pain  REVIEW OF SYSTEMS:   Constitutional: Denies fevers,  chills or abnormal weight loss Eyes: Denies blurriness of vision Ears, nose, mouth, throat, and face: Denies mucositis or sore throat Respiratory: Denies cough, dyspnea or wheezes Cardiovascular: Denies palpitation, chest discomfort or lower extremity swelling Gastrointestinal:  Denies nausea, heartburn or change in bowel habits Skin: Denies abnormal skin rashes Lymphatics: Denies new lymphadenopathy or easy bruising Neurological:Denies numbness, tingling or new weaknesses Behavioral/Psych: Mood is stable, no new changes  All other systems were reviewed with the patient and are negative.  I have reviewed the past medical history, past surgical history, social history and family history with the patient and they are unchanged from previous note.  ALLERGIES:  is allergic to hydromorphone.  MEDICATIONS:  Current Outpatient Prescriptions  Medication Sig Dispense Refill  . acyclovir (ZOVIRAX) 400 MG tablet TAKE 1 TABLET TWICE DAILY. 60 tablet 0  . aspirin 81 MG tablet Take 81 mg by mouth.    . cholecalciferol (VITAMIN D) 1000 UNITS tablet Take 1,000 Units by mouth daily.    . Multiple Vitamins-Minerals (ONE-A-DAY 50 PLUS PO) Take by mouth daily.    Marland Kitchen oxycodone (OXY-IR) 5 MG capsule Take 5 mg by mouth every 4 (four) hours as needed. pain    . Oxycodone HCl (OXYCONTIN) 60 MG TB12 Take 1 tablet by mouth 2 (two) times daily.    . pantoprazole (PROTONIX) 40 MG tablet Take 1 tablet (40 mg total) by  mouth daily. 30 tablet 9  . POMALYST 3 MG capsule TAKE 1 CAPSULE (3 MG TOTAL) BY MOUTH DAILY ON DAYS 1-21. REPEAT EVERY28 DAYS. 21 capsule 0   No current facility-administered medications for this visit.     PHYSICAL EXAMINATION: ECOG PERFORMANCE STATUS: 1 - Symptomatic but completely ambulatory GENERAL:alert, no distress and comfortable SKIN: skin color, texture, turgor are normal, no rashes or significant lesions EYES: normal, Conjunctiva are pink and non-injected, sclera clear OROPHARYNX:no  exudate, no erythema and lips, buccal mucosa, and tongue normal  NECK: supple, thyroid normal size, non-tender, without nodularity LYMPH:  no palpable lymphadenopathy in the cervical, axillary or inguinal LUNGS: clear to auscultation and percussion with normal breathing effort HEART: regular rate & rhythm and no murmurs and no lower extremity edema ABDOMEN:abdomen soft, non-tender and normal bowel sounds Musculoskeletal:no cyanosis of digits and no clubbing  NEURO: alert & oriented x 3 with fluent speech, no focal motor/sensory deficits  LABORATORY DATA:  I have reviewed the data as listed    Component Value Date/Time   NA 141 04/11/2016 0915   K 4.2 04/11/2016 0915   CL 107 08/30/2012 1055   CO2 29 04/11/2016 0915   GLUCOSE 100 04/11/2016 0915   GLUCOSE 102 (H) 08/30/2012 1055   BUN 17.0 04/11/2016 0915   CREATININE 0.7 04/11/2016 0915   CALCIUM 9.6 04/11/2016 0915   PROT 6.2 (L) 04/11/2016 0915   ALBUMIN 4.1 04/11/2016 0915   AST 23 04/11/2016 0915   ALT 18 04/11/2016 0915   ALKPHOS 60 04/11/2016 0915   BILITOT 0.31 04/11/2016 0915    No results found for: SPEP, UPEP  Lab Results  Component Value Date   WBC 3.0 (L) 04/11/2016   NEUTROABS 1.0 (L) 04/11/2016   HGB 13.0 04/11/2016   HCT 36.5 04/11/2016   MCV 93.8 04/11/2016   PLT 160 04/11/2016      Chemistry      Component Value Date/Time   NA 141 04/11/2016 0915   K 4.2 04/11/2016 0915   CL 107 08/30/2012 1055   CO2 29 04/11/2016 0915   BUN 17.0 04/11/2016 0915   CREATININE 0.7 04/11/2016 0915      Component Value Date/Time   CALCIUM 9.6 04/11/2016 0915   ALKPHOS 60 04/11/2016 0915   AST 23 04/11/2016 0915   ALT 18 04/11/2016 0915   BILITOT 0.31 04/11/2016 0915      ASSESSMENT & PLAN:  Multiple myeloma in remission ([place]) Repeat myeloma panel is pending She has successfully completed dexamethasone taper in November 2017 Velcade is stopped in December and she started taking Pomalyst as discussed on  03/14/16 The patient will receive zometa every 6 months, next due in April 2018 She will continue calcium with vitamin D and acyclovir for antimicrobial prophylaxis With neutropenia, I recommend do not restart Pomalyst until 04/16/2016 I will see her back again early next month for further toxicity review  Drug-induced neutropenia ([place]) This is related to Pomalyst. I will resume Daratumumab and recommend delay resumption of Pomalyst until next week   No orders of the defined types were placed in this encounter.  All questions were answered. The patient knows to call the clinic with any problems, questions or concerns. No barriers to learning was detected. I spent 15 minutes counseling the patient face to face. The total time spent in the appointment was 20 minutes and more than 50% was on counseling and review of test results     [name], MD 04/11/2016 7:28 PM

## 2016-04-11 NOTE — Patient Instructions (Signed)
[facility] Discharge Instructions for Patients Receiving Chemotherapy  Today you received the following chemotherapy agents Darzalex.  To help prevent nausea and vomiting after your treatment, we encourage you to take your nausea medication as directed.  If you develop nausea and vomiting that is not controlled by your nausea medication, call the clinic.   BELOW ARE SYMPTOMS THAT SHOULD BE REPORTED IMMEDIATELY:  *FEVER GREATER THAN 100.5 F  *CHILLS WITH OR WITHOUT FEVER  NAUSEA AND VOMITING THAT IS NOT CONTROLLED WITH YOUR NAUSEA MEDICATION  *UNUSUAL SHORTNESS OF BREATH  *UNUSUAL BRUISING OR BLEEDING  TENDERNESS IN MOUTH AND THROAT WITH OR WITHOUT PRESENCE OF ULCERS  *URINARY PROBLEMS  *BOWEL PROBLEMS  UNUSUAL RASH Items with * indicate a potential emergency and should be followed up as soon as possible.  Feel free to call the clinic you have any questions or concerns. The clinic phone number is [phone].  Please show the CHEMO ALERT CARD at check-in to the Emergency Department and triage nurse.    

## 2016-04-14 LAB — KAPPA/LAMBDA LIGHT CHAINS
Ig Kappa Free Light Chain: 92.9 mg/L — ABNORMAL HIGH (ref 3.3–19.4)
Ig Lambda Free Light Chain: 3.6 mg/L — ABNORMAL LOW (ref 5.7–26.3)
Kappa/Lambda FluidC Ratio: 25.81 — ABNORMAL HIGH (ref 0.26–1.65)

## 2016-04-15 LAB — MULTIPLE MYELOMA PANEL, SERUM
ALBUMIN/GLOB SERPL: 1.9 — AB (ref 0.7–1.7)
Albumin SerPl Elph-Mcnc: 3.7 g/dL (ref 2.9–4.4)
Alpha 1: 0.3 g/dL (ref 0.0–0.4)
Alpha2 Glob SerPl Elph-Mcnc: 0.6 g/dL (ref 0.4–1.0)
B-GLOBULIN SERPL ELPH-MCNC: 0.8 g/dL (ref 0.7–1.3)
GAMMA GLOB SERPL ELPH-MCNC: 0.3 g/dL — AB (ref 0.4–1.8)
Globulin, Total: 2 g/dL — ABNORMAL LOW (ref 2.2–3.9)
IGA/IMMUNOGLOBULIN A, SERUM: 11 mg/dL — AB (ref 87–352)
IgG, Qn, Serum: 315 mg/dL — ABNORMAL LOW (ref 700–1600)
IgM, Qn, Serum: 13 mg/dL — ABNORMAL LOW (ref 26–217)
M PROTEIN SERPL ELPH-MCNC: 0.1 g/dL — AB
Total Protein: 5.7 g/dL — ABNORMAL LOW (ref 6.0–8.5)

## 2016-04-16 ENCOUNTER — Telehealth: Payer: Self-pay

## 2016-04-16 NOTE — Telephone Encounter (Signed)
I cannot call him back now or next few days We looked at [facility] protein. M protein is stable The light chains tend to fluctuate so the ratio is more accurate. The ratio is going down

## 2016-04-16 NOTE — Telephone Encounter (Signed)
-----   Message from [name], MD sent at 04/16/2016  5:53 AM EST ----- Regarding: labs PLs call her and let her know myeloma panel is stable ----- Message ----- From: Interface, Lab In Three Zero One Sent: 04/11/2016   9:37 AM To: [name], MD

## 2016-04-16 NOTE — Telephone Encounter (Signed)
Husband called and stated he received the message from [facility] that the myeloma panes is stable. He says if he tells his wife that kappa light chains went from 74.2 to 92.9 his wife will not consider this as stable. He is requesting an explanation or for Dr Alvy Bimler to possibly call his wife.

## 2016-04-16 NOTE — Telephone Encounter (Signed)
Left message with husband about below information.

## 2016-04-16 NOTE — Telephone Encounter (Signed)
S/w husband about Dr Alvy Bimler response.

## 2016-04-23 ENCOUNTER — Other Ambulatory Visit: Payer: Self-pay | Admitting: Hematology and Oncology

## 2016-04-23 NOTE — Telephone Encounter (Signed)
PLs refill electronically

## 2016-04-24 ENCOUNTER — Other Ambulatory Visit: Payer: Self-pay | Admitting: *Deleted

## 2016-04-24 DIAGNOSIS — C9001 Multiple myeloma in remission: Secondary | ICD-10-CM

## 2016-04-24 MED ORDER — POMALIDOMIDE 3 MG PO CAPS: 3.0000 mg | ORAL_CAPSULE | Freq: Every day | ORAL | 0 refills | Status: DC

## 2016-04-24 NOTE — Progress Notes (Signed)
Authorization # for pomalyst [id number], faxed to [facility] [id number]

## 2016-04-30 ENCOUNTER — Other Ambulatory Visit: Payer: Self-pay | Admitting: Hematology and Oncology

## 2016-05-09 ENCOUNTER — Encounter: Payer: Self-pay | Admitting: Hematology and Oncology

## 2016-05-09 ENCOUNTER — Other Ambulatory Visit (HOSPITAL_BASED_OUTPATIENT_CLINIC_OR_DEPARTMENT_OTHER): Payer: 59

## 2016-05-09 ENCOUNTER — Ambulatory Visit (HOSPITAL_BASED_OUTPATIENT_CLINIC_OR_DEPARTMENT_OTHER): Payer: 59

## 2016-05-09 ENCOUNTER — Telehealth: Payer: Self-pay | Admitting: Hematology and Oncology

## 2016-05-09 ENCOUNTER — Ambulatory Visit (HOSPITAL_BASED_OUTPATIENT_CLINIC_OR_DEPARTMENT_OTHER): Payer: 59 | Admitting: Hematology and Oncology

## 2016-05-09 VITALS — BP 103/62 | HR 63 | Temp 98.9°F | Resp 18

## 2016-05-09 DIAGNOSIS — Z5112 Encounter for antineoplastic immunotherapy: Secondary | ICD-10-CM | POA: Diagnosis not present

## 2016-05-09 DIAGNOSIS — C9001 Multiple myeloma in remission: Secondary | ICD-10-CM | POA: Diagnosis not present

## 2016-05-09 DIAGNOSIS — D61810 Antineoplastic chemotherapy induced pancytopenia: Secondary | ICD-10-CM

## 2016-05-09 DIAGNOSIS — T451X5A Adverse effect of antineoplastic and immunosuppressive drugs, initial encounter: Secondary | ICD-10-CM

## 2016-05-09 LAB — CBC WITH DIFFERENTIAL/PLATELET
BASO%: 2.1 % — AB (ref 0.0–2.0)
Basophils Absolute: 0.1 10*3/uL (ref 0.0–0.1)
EOS%: 18.5 % — AB (ref 0.0–7.0)
Eosinophils Absolute: 0.4 10*3/uL (ref 0.0–0.5)
HCT: 35.3 % (ref 34.8–46.6)
HGB: 11.7 g/dL (ref 11.6–15.9)
LYMPH%: 30.3 % (ref 14.0–49.7)
MCH: 32.8 pg (ref 25.1–34.0)
MCHC: 33.1 g/dL (ref 31.5–36.0)
MCV: 98.9 fL (ref 79.5–101.0)
MONO#: 0.6 10*3/uL (ref 0.1–0.9)
MONO%: 23.1 % — ABNORMAL HIGH (ref 0.0–14.0)
NEUT%: 26 % — AB (ref 38.4–76.8)
NEUTROS ABS: 0.6 10*3/uL — AB (ref 1.5–6.5)
Platelets: 75 10*3/uL — ABNORMAL LOW (ref 145–400)
RBC: 3.57 10*6/uL — AB (ref 3.70–5.45)
RDW: 13.7 % (ref 11.2–14.5)
WBC: 2.4 10*3/uL — AB (ref 3.9–10.3)
lymph#: 0.7 10*3/uL — ABNORMAL LOW (ref 0.9–3.3)
nRBC: 0 % (ref 0–0)

## 2016-05-09 LAB — COMPREHENSIVE METABOLIC PANEL
ALT: 15 U/L (ref 0–55)
AST: 20 U/L (ref 5–34)
Albumin: 3.7 g/dL (ref 3.5–5.0)
Alkaline Phosphatase: 58 U/L (ref 40–150)
Anion Gap: 8 mEq/L (ref 3–11)
BUN: 14.4 mg/dL (ref 7.0–26.0)
CALCIUM: 8.6 mg/dL (ref 8.4–10.4)
CHLORIDE: 106 meq/L (ref 98–109)
CO2: 26 meq/L (ref 22–29)
CREATININE: 0.7 mg/dL (ref 0.6–1.1)
EGFR: >90 mL/min/{1.73_m2} (ref >90)
GLUCOSE: 85 mg/dL (ref 70–140)
POTASSIUM: 4.4 meq/L (ref 3.5–5.1)
SODIUM: 139 meq/L (ref 136–145)
Total Bilirubin: 0.38 mg/dL (ref 0.20–1.20)
Total Protein: 5.5 g/dL — ABNORMAL LOW (ref 6.4–8.3)

## 2016-05-09 MED ORDER — DIPHENHYDRAMINE HCL 25 MG PO CAPS
ORAL_CAPSULE | ORAL | Status: AC
  Filled 2016-05-09: qty 2

## 2016-05-09 MED ORDER — PROCHLORPERAZINE MALEATE 10 MG PO TABS
10.0000 mg | ORAL_TABLET | Freq: Once | ORAL | Status: AC
  Administered 2016-05-09: 10 mg via ORAL

## 2016-05-09 MED ORDER — HEPARIN SOD (PORK) LOCK FLUSH 100 UNIT/ML IV SOLN
500.0000 [IU] | Freq: Once | INTRAVENOUS | Status: AC | PRN
  Administered 2016-05-09: 500 [IU]
  Filled 2016-05-09: qty 5

## 2016-05-09 MED ORDER — SODIUM CHLORIDE 0.9 % IV SOLN
15.5000 mg/kg | Freq: Once | INTRAVENOUS | Status: AC
  Administered 2016-05-09: 900 mg via INTRAVENOUS
  Filled 2016-05-09: qty 40

## 2016-05-09 MED ORDER — DIPHENHYDRAMINE HCL 25 MG PO CAPS
50.0000 mg | ORAL_CAPSULE | Freq: Once | ORAL | Status: AC
  Administered 2016-05-09: 50 mg via ORAL

## 2016-05-09 MED ORDER — METHYLPREDNISOLONE SODIUM SUCC 125 MG IJ SOLR
INTRAMUSCULAR | Status: AC
  Filled 2016-05-09: qty 2

## 2016-05-09 MED ORDER — PROCHLORPERAZINE MALEATE 10 MG PO TABS
ORAL_TABLET | ORAL | Status: AC
  Filled 2016-05-09: qty 1

## 2016-05-09 MED ORDER — ACETAMINOPHEN 325 MG PO TABS
650.0000 mg | ORAL_TABLET | Freq: Once | ORAL | Status: AC
  Administered 2016-05-09: 650 mg via ORAL

## 2016-05-09 MED ORDER — METHYLPREDNISOLONE SODIUM SUCC 125 MG IJ SOLR
125.0000 mg | Freq: Once | INTRAMUSCULAR | Status: AC
  Administered 2016-05-09: 125 mg via INTRAVENOUS

## 2016-05-09 MED ORDER — ACETAMINOPHEN 325 MG PO TABS
ORAL_TABLET | ORAL | Status: AC
Start: 2016-05-09 — End: 2016-05-09
  Filled 2016-05-09: qty 2

## 2016-05-09 MED ORDER — SODIUM CHLORIDE 0.9 % IV SOLN
Freq: Once | INTRAVENOUS | Status: AC
  Administered 2016-05-09: 10:00:00 via INTRAVENOUS

## 2016-05-09 MED ORDER — SODIUM CHLORIDE 0.9% FLUSH
10.0000 mL | INTRAVENOUS | Status: DC | PRN
  Administered 2016-05-09: 10 mL
  Filled 2016-05-09: qty 10

## 2016-05-09 NOTE — Progress Notes (Signed)
[place] OFFICE PROGRESS NOTE  Patient Care Team: [name], MD as PCP - General [name], MD as Consulting Physician (Oncology) [name], MD as Consulting Physician (Neurosurgery) [name], MD as Consulting Physician (Hematology and Oncology)  SUMMARY OF ONCOLOGIC HISTORY:   Multiple myeloma in remission [facility])   07/21/2007 Bone Marrow Biopsy    Case #: [id number] Bone marrow showed myeloma      07/21/2007 Miscellaneous    She was diagnosed in May 2009. Had several cycles of RVD      10/14/2007 Bone Marrow Biopsy    Case #: [id number] repeat bone marrow biopsy showed good response to Rx      12/31/2007 Bone Marrow Transplant    She had autologous BMT at [facility]      07/19/2009 Bone Marrow Biopsy    Case #: [id number] Bone marrow biopsy showed only 1% plasma cells      10/10/2010 Bone Marrow Biopsy    [id number] Bone marrow biopsy showed 2% plasma cells      09/25/2011 Bone Marrow Biopsy    Accession #: [id number] Bone marrow only showed 4% plasma cells with ligh chain excess      10/14/2011 - 06/13/2013 Chemotherapy    She received Revlimid only, discontinued due to pancytopenia      06/18/2015 - 09/13/2015 Chemotherapy    She resumed taking Revlimid and dexamethasone. Treatment is stopped due to minimum response and pancytopenia      10/19/2015 -  Chemotherapy    The patient had Velcade for chemotherapy treatment along with weekly Daratumumab. Last dose of Velcade on 02/29/16, stopped due to progression. Pomalidomide added on 03/14/16      04/11/2016 Adverse Reaction    Delay resumption of Pomalyst cycle 2 until 04/16/16 due to neutropenia       INTERVAL HISTORY: Please see below for problem oriented charting. She is seen in the infusion room. She just finished Pomalyst recently. She denies recent infection.  No new bone pain. The patient denies any recent signs or symptoms of bleeding such as spontaneous epistaxis, hematuria or  hematochezia.   REVIEW OF SYSTEMS:   Constitutional: Denies fevers, chills or abnormal weight loss Eyes: Denies blurriness of vision Ears, nose, mouth, throat, and face: Denies mucositis or sore throat Respiratory: Denies cough, dyspnea or wheezes Cardiovascular: Denies palpitation, chest discomfort or lower extremity swelling Gastrointestinal:  Denies nausea, heartburn or change in bowel habits Skin: Denies abnormal skin rashes Lymphatics: Denies new lymphadenopathy or easy bruising Neurological:Denies numbness, tingling or new weaknesses Behavioral/Psych: Mood is stable, no new changes  All other systems were reviewed with the patient and are negative.  I have reviewed the past medical history, past surgical history, social history and family history with the patient and they are unchanged from previous note.  ALLERGIES:  is allergic to hydromorphone.  MEDICATIONS:  Current Outpatient Prescriptions  Medication Sig Dispense Refill  . acyclovir (ZOVIRAX) 400 MG tablet TAKE 1 TABLET TWICE DAILY. 60 tablet 0  . aspirin 81 MG tablet Take 81 mg by mouth.    . cholecalciferol (VITAMIN D) 1000 UNITS tablet Take 1,000 Units by mouth daily.    . Multiple Vitamins-Minerals (ONE-A-DAY 50 PLUS PO) Take by mouth daily.    Marland Kitchen oxycodone (OXY-IR) 5 MG capsule Take 5 mg by mouth every 4 (four) hours as needed. pain    . Oxycodone HCl (OXYCONTIN) 60 MG TB12 Take 1 tablet by mouth 2 (two) times daily.    Marland Kitchen  pantoprazole (PROTONIX) 40 MG tablet Take 1 tablet (40 mg total) by mouth daily. 30 tablet 9  . pomalidomide (POMALYST) 3 MG capsule Take 1 capsule (3 mg total) by mouth daily. Take with water on days 1-21. Repeat every 28 days. 28 capsule 0   No current facility-administered medications for this visit.    Facility-Administered Medications Ordered in Other Visits  Medication Dose Route Frequency Provider Last Rate Last Dose  . heparin lock flush 100 unit/mL  500 Units Intracatheter Once PRN [name], MD      . sodium chloride flush (NS) 0.9 % injection 10 mL  10 mL Intracatheter PRN [name], MD        PHYSICAL EXAMINATION: ECOG PERFORMANCE STATUS: 0 - Asymptomatic GENERAL:alert, no distress and comfortable SKIN: skin color, texture, turgor are normal, no rashes or significant lesions EYES: normal, Conjunctiva are pink and non-injected, sclera clear OROPHARYNX:no exudate, no erythema and lips, buccal mucosa, and tongue normal  NECK: supple, thyroid normal size, non-tender, without nodularity LYMPH:  no palpable lymphadenopathy in the cervical, axillary or inguinal LUNGS: clear to auscultation and percussion with normal breathing effort HEART: regular rate & rhythm and no murmurs and no lower extremity edema ABDOMEN:abdomen soft, non-tender and normal bowel sounds Musculoskeletal:no cyanosis of digits and no clubbing  NEURO: alert & oriented x 3 with fluent speech, no focal motor/sensory deficits  LABORATORY DATA:  I have reviewed the data as listed    Component Value Date/Time   NA 139 05/09/2016 0900   K 4.4 05/09/2016 0900   CL 107 08/30/2012 1055   CO2 26 05/09/2016 0900   GLUCOSE 85 05/09/2016 0900   GLUCOSE 102 (H) 08/30/2012 1055   BUN 14.4 05/09/2016 0900   CREATININE 0.7 05/09/2016 0900   CALCIUM 8.6 05/09/2016 0900   PROT 5.5 (L) 05/09/2016 0900   ALBUMIN 3.7 05/09/2016 0900   AST 20 05/09/2016 0900   ALT 15 05/09/2016 0900   ALKPHOS 58 05/09/2016 0900   BILITOT 0.38 05/09/2016 0900    No results found for: SPEP, UPEP  Lab Results  Component Value Date   WBC 2.4 (L) 05/09/2016   NEUTROABS 0.6 (L) 05/09/2016   HGB 11.7 05/09/2016   HCT 35.3 05/09/2016   MCV 98.9 05/09/2016   PLT 75 (L) 05/09/2016      Chemistry      Component Value Date/Time   NA 139 05/09/2016 0900   K 4.4 05/09/2016 0900   CL 107 08/30/2012 1055   CO2 26 05/09/2016 0900   BUN 14.4 05/09/2016 0900   CREATININE 0.7 05/09/2016 0900      Component Value Date/Time    CALCIUM 8.6 05/09/2016 0900   ALKPHOS 58 05/09/2016 0900   AST 20 05/09/2016 0900   ALT 15 05/09/2016 0900   BILITOT 0.38 05/09/2016 0900      ASSESSMENT & PLAN:  Multiple myeloma in remission [facility]) She has successfully completed dexamethasone taper in November 2017 Velcade is stopped in December and she started taking Pomalyst as discussed on 03/14/16 The patient will receive zometa every 6 months, next due in April 2018 She will continue calcium with vitamin D and acyclovir for antimicrobial prophylaxis Repeat myeloma panel is pending. Despite leukopenia, she is not symptomatic. I would proceed with Daratumumab. She just came off Pomalyst and I think that is the cause of her pancytopenia. I plan to see her next week to review results of myeloma panel and repeat blood work. 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 Technically, she is not anemic but her hemoglobin has dropped drastically compared to her previous blood work 2 weeks ago. We discussed neutropenic precautions. There is no contraindication to remain on antiplatelet agents or anticoagulants as long as the platelet is greater than 50,000.     No orders of the defined types were placed in this encounter.  All questions were answered. The patient knows to call the clinic with any problems, questions or concerns. No barriers to learning was detected. I spent 15 minutes counseling the patient face to face. The total time spent in the appointment was 20 minutes and more than 50% was on counseling and review of test results     [name], MD 05/09/2016 2:56 PM

## 2016-05-09 NOTE — Telephone Encounter (Signed)
Gave patients husband AVS and calender for next appt per Dr. Alvy Bimler.

## 2016-05-09 NOTE — Assessment & Plan Note (Signed)
She has leukopenia and thrombocytopenia, likely due to recent treatment with Pomalyst. Technically, she is not anemic but her hemoglobin has dropped drastically compared to her previous blood work 2 weeks ago. We discussed neutropenic precautions. There is no contraindication to remain on antiplatelet agents or anticoagulants as long as the platelet is greater than 50,000.

## 2016-05-09 NOTE — Assessment & Plan Note (Signed)
She has successfully completed dexamethasone taper in November 2017 Velcade is stopped in December and she started taking Pomalyst as discussed on 03/14/16 The patient will receive zometa every 6 months, next due in April 2018 She will continue calcium with vitamin D and acyclovir for antimicrobial prophylaxis Repeat myeloma panel is pending. Despite leukopenia, she is not symptomatic. I would proceed with Daratumumab. She just came off Pomalyst and I think that is the cause of her pancytopenia. I plan to see her next week to review results of myeloma panel and repeat blood work. She agreed

## 2016-05-09 NOTE — Progress Notes (Signed)
Ok to treat today with ANC of 0.5 and Plt of 75 per Dr. Alvy Bimler.

## 2016-05-12 LAB — KAPPA/LAMBDA LIGHT CHAINS
IG KAPPA FREE LIGHT CHAIN: 54 mg/L — AB (ref 3.3–19.4)
Ig Lambda Free Light Chain: 7 mg/L (ref 5.7–26.3)
KAPPA/LAMBDA FLC RATIO: 7.71 — AB (ref 0.26–1.65)

## 2016-05-14 LAB — MULTIPLE MYELOMA PANEL, SERUM
ALBUMIN/GLOB SERPL: 1.9 — AB (ref 0.7–1.7)
Albumin SerPl Elph-Mcnc: 3.5 g/dL (ref 2.9–4.4)
Alpha 1: 0.3 g/dL (ref 0.0–0.4)
Alpha2 Glob SerPl Elph-Mcnc: 0.6 g/dL (ref 0.4–1.0)
B-GLOBULIN SERPL ELPH-MCNC: 0.7 g/dL (ref 0.7–1.3)
GAMMA GLOB SERPL ELPH-MCNC: 0.2 g/dL — AB (ref 0.4–1.8)
GLOBULIN, TOTAL: 1.9 g/dL — AB (ref 2.2–3.9)
IGA/IMMUNOGLOBULIN A, SERUM: 11 mg/dL — AB (ref 87–352)
IgG, Qn, Serum: 293 mg/dL — ABNORMAL LOW (ref 700–1600)
IgM, Qn, Serum: 13 mg/dL — ABNORMAL LOW (ref 26–217)
M Protein SerPl Elph-Mcnc: 0.1 g/dL — ABNORMAL HIGH
Total Protein: 5.4 g/dL — ABNORMAL LOW (ref 6.0–8.5)

## 2016-05-15 ENCOUNTER — Ambulatory Visit (HOSPITAL_BASED_OUTPATIENT_CLINIC_OR_DEPARTMENT_OTHER): Payer: 59 | Admitting: Hematology and Oncology

## 2016-05-15 ENCOUNTER — Other Ambulatory Visit (HOSPITAL_BASED_OUTPATIENT_CLINIC_OR_DEPARTMENT_OTHER): Payer: 59

## 2016-05-15 ENCOUNTER — Telehealth: Payer: Self-pay | Admitting: Hematology and Oncology

## 2016-05-15 VITALS — BP 112/81 | HR 76 | Temp 97.6°F | Resp 18 | Ht 59.0 in | Wt 126.1 lb

## 2016-05-15 DIAGNOSIS — C9001 Multiple myeloma in remission: Secondary | ICD-10-CM

## 2016-05-15 DIAGNOSIS — D61810 Antineoplastic chemotherapy induced pancytopenia: Secondary | ICD-10-CM

## 2016-05-15 DIAGNOSIS — T451X5A Adverse effect of antineoplastic and immunosuppressive drugs, initial encounter: Secondary | ICD-10-CM

## 2016-05-15 LAB — COMPREHENSIVE METABOLIC PANEL
ALBUMIN: 4 g/dL (ref 3.5–5.0)
ALK PHOS: 57 U/L (ref 40–150)
ALT: 20 U/L (ref 0–55)
ANION GAP: 8 meq/L (ref 3–11)
AST: 23 U/L (ref 5–34)
BILIRUBIN TOTAL: 0.39 mg/dL (ref 0.20–1.20)
BUN: 12.8 mg/dL (ref 7.0–26.0)
CALCIUM: 9.1 mg/dL (ref 8.4–10.4)
CHLORIDE: 105 meq/L (ref 98–109)
CO2: 29 mEq/L (ref 22–29)
CREATININE: 0.8 mg/dL (ref 0.6–1.1)
EGFR: 86 mL/min/{1.73_m2} — ABNORMAL LOW (ref >90)
Glucose: 82 mg/dl (ref 70–140)
Potassium: 3.9 mEq/L (ref 3.5–5.1)
Sodium: 142 mEq/L (ref 136–145)
TOTAL PROTEIN: 6.1 g/dL — AB (ref 6.4–8.3)

## 2016-05-15 LAB — CBC WITH DIFFERENTIAL/PLATELET
BASO%: 4.2 % — AB (ref 0.0–2.0)
Basophils Absolute: 0.1 10*3/uL (ref 0.0–0.1)
EOS%: 9.1 % — AB (ref 0.0–7.0)
Eosinophils Absolute: 0.3 10*3/uL (ref 0.0–0.5)
HEMATOCRIT: 39.5 % (ref 34.8–46.6)
HEMOGLOBIN: 13.1 g/dL (ref 11.6–15.9)
LYMPH#: 1.1 10*3/uL (ref 0.9–3.3)
LYMPH%: 34.6 % (ref 14.0–49.7)
MCH: 33 pg (ref 25.1–34.0)
MCHC: 33.2 g/dL (ref 31.5–36.0)
MCV: 99.5 fL (ref 79.5–101.0)
MONO#: 0.8 10*3/uL (ref 0.1–0.9)
MONO%: 25.9 % — ABNORMAL HIGH (ref 0.0–14.0)
NEUT%: 26.2 % — ABNORMAL LOW (ref 38.4–76.8)
NEUTROS ABS: 0.8 10*3/uL — AB (ref 1.5–6.5)
PLATELETS: 137 10*3/uL — AB (ref 145–400)
RBC: 3.97 10*6/uL (ref 3.70–5.45)
RDW: 13.7 % (ref 11.2–14.5)
WBC: 3.1 10*3/uL — ABNORMAL LOW (ref 3.9–10.3)

## 2016-05-15 NOTE — Telephone Encounter (Signed)
Labs added for 03/16 & 03/23, with lab, flush, chemo and follow up with MD in infusion area for 15 mins, per 05/15/16 los. Patient was given a copy of the AVS report and appointment schedule, per 05/15/16 los.

## 2016-05-16 ENCOUNTER — Encounter: Payer: Self-pay | Admitting: Hematology and Oncology

## 2016-05-16 ENCOUNTER — Other Ambulatory Visit: Payer: Self-pay

## 2016-05-16 MED ORDER — POMALIDOMIDE 2 MG PO CAPS: ORAL_CAPSULE | ORAL | 11 refills | Status: DC

## 2016-05-16 NOTE — Progress Notes (Signed)
[place] OFFICE PROGRESS NOTE  Patient Care Team: [name], MD as PCP - General [name], MD as Consulting Physician (Oncology) [name], MD as Consulting Physician (Neurosurgery) [name], MD as Consulting Physician (Hematology and Oncology)  SUMMARY OF ONCOLOGIC HISTORY:   Multiple myeloma in remission [facility])   07/21/2007 Bone Marrow Biopsy    Case #: [id number] Bone marrow showed myeloma      07/21/2007 Miscellaneous    She was diagnosed in May 2009. Had several cycles of RVD      10/14/2007 Bone Marrow Biopsy    Case #: [id number] repeat bone marrow biopsy showed good response to Rx      12/31/2007 Bone Marrow Transplant    She had autologous BMT at [facility]      07/19/2009 Bone Marrow Biopsy    Case #: [id number] Bone marrow biopsy showed only 1% plasma cells      10/10/2010 Bone Marrow Biopsy    [id number] Bone marrow biopsy showed 2% plasma cells      09/25/2011 Bone Marrow Biopsy    Accession #: [id number] Bone marrow only showed 4% plasma cells with ligh chain excess      10/14/2011 - 06/13/2013 Chemotherapy    She received Revlimid only, discontinued due to pancytopenia      06/18/2015 - 09/13/2015 Chemotherapy    She resumed taking Revlimid and dexamethasone. Treatment is stopped due to minimum response and pancytopenia      10/19/2015 -  Chemotherapy    The patient had Velcade for chemotherapy treatment along with weekly Daratumumab. Last dose of Velcade on 02/29/16, stopped due to progression. Pomalidomide added on 03/14/16      04/11/2016 Adverse Reaction    Delay resumption of Pomalyst cycle 2 until 04/16/16 due to neutropenia       INTERVAL HISTORY: Please see below for problem oriented charting. She returns with her husband to review test results. She feels well. Apart for some fatigue and her chronic back pain, she has no new complaints The patient denies any recent signs or symptoms of bleeding such as spontaneous  epistaxis, hematuria or hematochezia.  REVIEW OF SYSTEMS:   Constitutional: Denies fevers, chills or abnormal weight loss Eyes: Denies blurriness of vision Ears, nose, mouth, throat, and face: Denies mucositis or sore throat Respiratory: Denies cough, dyspnea or wheezes Cardiovascular: Denies palpitation, chest discomfort or lower extremity swelling Gastrointestinal:  Denies nausea, heartburn or change in bowel habits Skin: Denies abnormal skin rashes Lymphatics: Denies new lymphadenopathy or easy bruising Neurological:Denies numbness, tingling or new weaknesses Behavioral/Psych: Mood is stable, no new changes  All other systems were reviewed with the patient and are negative.  I have reviewed the past medical history, past surgical history, social history and family history with the patient and they are unchanged from previous note.  ALLERGIES:  is allergic to hydromorphone.  MEDICATIONS:  Current Outpatient Prescriptions  Medication Sig Dispense Refill  . acyclovir (ZOVIRAX) 400 MG tablet TAKE 1 TABLET TWICE DAILY. 60 tablet 0  . aspirin 81 MG tablet Take 81 mg by mouth.    . cholecalciferol (VITAMIN D) 1000 UNITS tablet Take 1,000 Units by mouth daily.    . Multiple Vitamins-Minerals (ONE-A-DAY 50 PLUS PO) Take by mouth daily.    Marland Kitchen oxycodone (OXY-IR) 5 MG capsule Take 5 mg by mouth every 4 (four) hours as needed. pain    . Oxycodone HCl (OXYCONTIN) 60 MG TB12 Take 1 tablet by mouth 2 (two) times  daily.    . pantoprazole (PROTONIX) 40 MG tablet Take 1 tablet (40 mg total) by mouth daily. 30 tablet 9  . pomalidomide (POMALYST) 2 MG capsule Take with water on days 1-21. Repeat every 28 days. 21 capsule 11   No current facility-administered medications for this visit.     PHYSICAL EXAMINATION: ECOG PERFORMANCE STATUS: 1 - Symptomatic but completely ambulatory  Vitals:   05/15/16 1012  BP: 112/81  Pulse: 76  Resp: 18  Temp: 97.6 F (36.4 C)   Filed Weights   05/15/16 1012   Weight: 126 lb 1.6 oz (57.2 kg)    GENERAL:alert, no distress and comfortable SKIN: skin color, texture, turgor are normal, no rashes or significant lesions EYES: normal, Conjunctiva are pink and non-injected, sclera clear Musculoskeletal:no cyanosis of digits and no clubbing  NEURO: alert & oriented x 3 with fluent speech, no focal motor/sensory deficits  LABORATORY DATA:  I have reviewed the data as listed    Component Value Date/Time   NA 142 05/15/2016 1000   K 3.9 05/15/2016 1000   CL 107 08/30/2012 1055   CO2 29 05/15/2016 1000   GLUCOSE 82 05/15/2016 1000   GLUCOSE 102 (H) 08/30/2012 1055   BUN 12.8 05/15/2016 1000   CREATININE 0.8 05/15/2016 1000   CALCIUM 9.1 05/15/2016 1000   PROT 6.1 (L) 05/15/2016 1000   ALBUMIN 4.0 05/15/2016 1000   AST 23 05/15/2016 1000   ALT 20 05/15/2016 1000   ALKPHOS 57 05/15/2016 1000   BILITOT 0.39 05/15/2016 1000    No results found for: SPEP, UPEP  Lab Results  Component Value Date   WBC 3.1 (L) 05/15/2016   NEUTROABS 0.8 (L) 05/15/2016   HGB 13.1 05/15/2016   HCT 39.5 05/15/2016   MCV 99.5 05/15/2016   PLT 137 (L) 05/15/2016      Chemistry      Component Value Date/Time   NA 142 05/15/2016 1000   K 3.9 05/15/2016 1000   CL 107 08/30/2012 1055   CO2 29 05/15/2016 1000   BUN 12.8 05/15/2016 1000   CREATININE 0.8 05/15/2016 1000      Component Value Date/Time   CALCIUM 9.1 05/15/2016 1000   ALKPHOS 57 05/15/2016 1000   AST 23 05/15/2016 1000   ALT 20 05/15/2016 1000   BILITOT 0.39 05/15/2016 1000      ASSESSMENT & PLAN:  Multiple myeloma in remission ([place]) She is delighted to see that despite multiple interruption to the dose of Pomalyst she has positive response to treatment in addition to treatment of Daratumumab. For now, due to severe pancytopenia, the patient is advised to take Pomalyst every other day and to her current prescription is finished. Afterwards, I would get insurance authorization for reduced  dose Pomalyst for her next cycle. She will continue Daratumumab once every 4 weeks. She will continue to take aspirin for DVT prophylaxis. She is advised to take calcium with vitamin D supplement and will continue to receive Zometa every 3 months Did have a lot of questions related to other treatment options. We discussed other treatment available and some of the recent advances such  immunotherapy with car T-cell there is currently at an experimental level for treatment for multiple myeloma. For now, as she has positive response to treatment, we will continue the same as above  Pancytopenia due to antineoplastic chemotherapy [facility]) She has leukopenia and thrombocytopenia, likely due to recent treatment with Pomalyst. We discussed neutropenic precautions. There is no contraindication to remain on  antiplatelet agents or anticoagulants as long as the platelet is greater than 50,000.     Orders Placed This Encounter  Procedures  . Kappa/lambda light chains    Standing Status:   Future    Standing Expiration Date:   06/19/2017  . Multiple Myeloma Panel (SPEP&IFE w/QIG)    Standing Status:   Future    Standing Expiration Date:   06/19/2017   All questions were answered. The patient knows to call the clinic with any problems, questions or concerns. No barriers to learning was detected. I spent 15 minutes counseling the patient face to face. The total time spent in the appointment was 20 minutes and more than 50% was on counseling and review of test results     [name], MD 05/16/2016 7:37 AM

## 2016-05-16 NOTE — Assessment & Plan Note (Addendum)
She is delighted to see that despite multiple interruption to the dose of Pomalyst she has positive response to treatment in addition to treatment of Daratumumab. For now, due to severe pancytopenia, the patient is advised to take Pomalyst every other day and to her current prescription is finished. Afterwards, I would get insurance authorization for reduced dose Pomalyst for her next cycle. She will continue Daratumumab once every 4 weeks. She will continue to take aspirin for DVT prophylaxis. She is advised to take calcium with vitamin D supplement and will continue to receive Zometa every 3 months Did have a lot of questions related to other treatment options. We discussed other treatment available and some of the recent advances such  immunotherapy with car T-cell there is currently at an experimental level for treatment for multiple myeloma. For now, as she has positive response to treatment, we will continue the same as above

## 2016-05-16 NOTE — Assessment & Plan Note (Signed)
She has leukopenia and thrombocytopenia, likely due to recent treatment with Pomalyst. We discussed neutropenic precautions. There is no contraindication to remain on antiplatelet agents or anticoagulants as long as the platelet is greater than 50,000.

## 2016-05-22 ENCOUNTER — Telehealth: Payer: Self-pay | Admitting: Pharmacist

## 2016-05-22 DIAGNOSIS — C9001 Multiple myeloma in remission: Secondary | ICD-10-CM

## 2016-05-22 MED ORDER — POMALIDOMIDE 2 MG PO CAPS: ORAL_CAPSULE | ORAL | 11 refills | Status: DC

## 2016-05-22 NOTE — Telephone Encounter (Signed)
Oral Chemotherapy Pharmacist Encounter  Received prescription for Pomalyst for Multiple Myeloma at a dose reduction from what the patient has been taking due to cytopenias. Pomalyst prescription have been filled at [facility] in [place]. [place], [place]. Prescription has been e-scribed to this location.  Oral oncology Clinic will continue to follow for any issues.  Johny Drilling, PharmD, BCPS, BCOP 05/22/2016  10:32 AM Oral Oncology Clinic [phone]

## 2016-05-23 ENCOUNTER — Telehealth: Payer: Self-pay | Admitting: *Deleted

## 2016-05-23 ENCOUNTER — Other Ambulatory Visit (HOSPITAL_BASED_OUTPATIENT_CLINIC_OR_DEPARTMENT_OTHER): Payer: 59

## 2016-05-23 ENCOUNTER — Other Ambulatory Visit: Payer: Self-pay | Admitting: *Deleted

## 2016-05-23 DIAGNOSIS — D61810 Antineoplastic chemotherapy induced pancytopenia: Secondary | ICD-10-CM | POA: Diagnosis not present

## 2016-05-23 DIAGNOSIS — C9001 Multiple myeloma in remission: Secondary | ICD-10-CM

## 2016-05-23 LAB — CBC WITH DIFFERENTIAL/PLATELET
BASO%: 3.9 % — ABNORMAL HIGH (ref 0.0–2.0)
BASOS ABS: 0.2 10*3/uL — AB (ref 0.0–0.1)
EOS ABS: 0.3 10*3/uL (ref 0.0–0.5)
EOS%: 6.9 % (ref 0.0–7.0)
HEMATOCRIT: 38.3 % (ref 34.8–46.6)
HEMOGLOBIN: 12.8 g/dL (ref 11.6–15.9)
LYMPH#: 1.1 10*3/uL (ref 0.9–3.3)
LYMPH%: 27.9 % (ref 14.0–49.7)
MCH: 33.2 pg (ref 25.1–34.0)
MCHC: 33.4 g/dL (ref 31.5–36.0)
MCV: 99.5 fL (ref 79.5–101.0)
MONO#: 0.3 10*3/uL (ref 0.1–0.9)
MONO%: 7.1 % (ref 0.0–14.0)
NEUT#: 2.1 10*3/uL (ref 1.5–6.5)
NEUT%: 54.2 % (ref 38.4–76.8)
PLATELETS: 144 10*3/uL — AB (ref 145–400)
RBC: 3.85 10*6/uL (ref 3.70–5.45)
RDW: 14.4 % (ref 11.2–14.5)
WBC: 3.9 10*3/uL (ref 3.9–10.3)

## 2016-05-23 LAB — COMPREHENSIVE METABOLIC PANEL
ALT: 17 U/L (ref 0–55)
AST: 22 U/L (ref 5–34)
Albumin: 3.8 g/dL (ref 3.5–5.0)
Alkaline Phosphatase: 58 U/L (ref 40–150)
Anion Gap: 7 mEq/L (ref 3–11)
BILIRUBIN TOTAL: 0.47 mg/dL (ref 0.20–1.20)
BUN: 16.1 mg/dL (ref 7.0–26.0)
CO2: 28 meq/L (ref 22–29)
CREATININE: 0.9 mg/dL (ref 0.6–1.1)
Calcium: 8.8 mg/dL (ref 8.4–10.4)
Chloride: 105 mEq/L (ref 98–109)
EGFR: 77 mL/min/{1.73_m2} — ABNORMAL LOW (ref >90)
GLUCOSE: 75 mg/dL (ref 70–140)
Potassium: 3.9 mEq/L (ref 3.5–5.1)
SODIUM: 141 meq/L (ref 136–145)
TOTAL PROTEIN: 5.8 g/dL — AB (ref 6.4–8.3)

## 2016-05-23 NOTE — Telephone Encounter (Signed)
Received fax from [facility] that the auth number for most recent pomalyst was used with the last prescription. Dr Alvy Bimler reduced the dose. Will do new refill on 06/02/16 with new dose and new auth #. Pt has enough pills to last this month

## 2016-05-23 NOTE — Telephone Encounter (Signed)
Notified of results

## 2016-05-23 NOTE — Telephone Encounter (Signed)
-----   Message from [name], MD sent at 05/23/2016 11:21 AM EDT ----- Regarding: CBC Tell her CBC is OK ----- Message ----- From: Interface, Lab In Three Zero One Sent: 05/23/2016  11:10 AM To: [name], MD

## 2016-05-28 ENCOUNTER — Other Ambulatory Visit: Payer: Self-pay | Admitting: Hematology and Oncology

## 2016-05-28 NOTE — Telephone Encounter (Signed)
Pls refill electronically °

## 2016-05-30 ENCOUNTER — Telehealth: Payer: Self-pay

## 2016-05-30 ENCOUNTER — Other Ambulatory Visit (HOSPITAL_BASED_OUTPATIENT_CLINIC_OR_DEPARTMENT_OTHER): Payer: 59

## 2016-05-30 DIAGNOSIS — C9001 Multiple myeloma in remission: Secondary | ICD-10-CM

## 2016-05-30 LAB — COMPREHENSIVE METABOLIC PANEL
ALBUMIN: 3.9 g/dL (ref 3.5–5.0)
ALT: 19 U/L (ref 0–55)
AST: 22 U/L (ref 5–34)
Alkaline Phosphatase: 58 U/L (ref 40–150)
Anion Gap: 8 mEq/L (ref 3–11)
BUN: 11.7 mg/dL (ref 7.0–26.0)
CHLORIDE: 105 meq/L (ref 98–109)
CO2: 30 meq/L — AB (ref 22–29)
CREATININE: 0.8 mg/dL (ref 0.6–1.1)
Calcium: 9.4 mg/dL (ref 8.4–10.4)
EGFR: 79 mL/min/{1.73_m2} — AB (ref >90)
GLUCOSE: 91 mg/dL (ref 70–140)
POTASSIUM: 3.7 meq/L (ref 3.5–5.1)
SODIUM: 143 meq/L (ref 136–145)
Total Bilirubin: 0.48 mg/dL (ref 0.20–1.20)
Total Protein: 6 g/dL — ABNORMAL LOW (ref 6.4–8.3)

## 2016-05-30 LAB — CBC WITH DIFFERENTIAL/PLATELET
BASO%: 3.3 % — ABNORMAL HIGH (ref 0.0–2.0)
BASOS ABS: 0.1 10*3/uL (ref 0.0–0.1)
EOS ABS: 0.3 10*3/uL (ref 0.0–0.5)
EOS%: 7.6 % — AB (ref 0.0–7.0)
HCT: 40 % (ref 34.8–46.6)
HEMOGLOBIN: 13.3 g/dL (ref 11.6–15.9)
LYMPH%: 23.5 % (ref 14.0–49.7)
MCH: 33.2 pg (ref 25.1–34.0)
MCHC: 33.2 g/dL (ref 31.5–36.0)
MCV: 100 fL (ref 79.5–101.0)
MONO#: 0.5 10*3/uL (ref 0.1–0.9)
MONO%: 14.6 % — AB (ref 0.0–14.0)
NEUT#: 1.9 10*3/uL (ref 1.5–6.5)
NEUT%: 51 % (ref 38.4–76.8)
Platelets: 162 10*3/uL (ref 145–400)
RBC: 4 10*6/uL (ref 3.70–5.45)
RDW: 14.5 % (ref 11.2–14.5)
WBC: 3.7 10*3/uL — AB (ref 3.9–10.3)
lymph#: 0.9 10*3/uL (ref 0.9–3.3)

## 2016-05-30 NOTE — Telephone Encounter (Signed)
Given below message.

## 2016-05-30 NOTE — Telephone Encounter (Signed)
-----   Message from [name], MD sent at 05/30/2016 11:30 AM EDT ----- Regarding: labs OK Even though WBC is a bit low, OK to proceed with Pomalyst ----- Message ----- From: Interface, Lab In Three Zero One Sent: 05/30/2016  11:12 AM To: [name], MD

## 2016-06-02 ENCOUNTER — Other Ambulatory Visit: Payer: Self-pay | Admitting: *Deleted

## 2016-06-02 DIAGNOSIS — C9001 Multiple myeloma in remission: Secondary | ICD-10-CM

## 2016-06-02 MED ORDER — POMALIDOMIDE 2 MG PO CAPS: ORAL_CAPSULE | ORAL | 11 refills | Status: DC

## 2016-06-06 ENCOUNTER — Telehealth: Payer: Self-pay | Admitting: Hematology and Oncology

## 2016-06-06 ENCOUNTER — Other Ambulatory Visit: Payer: Self-pay | Admitting: Hematology and Oncology

## 2016-06-06 ENCOUNTER — Ambulatory Visit (HOSPITAL_BASED_OUTPATIENT_CLINIC_OR_DEPARTMENT_OTHER): Payer: 59 | Admitting: Hematology and Oncology

## 2016-06-06 ENCOUNTER — Encounter: Payer: Self-pay | Admitting: Hematology and Oncology

## 2016-06-06 ENCOUNTER — Other Ambulatory Visit (HOSPITAL_BASED_OUTPATIENT_CLINIC_OR_DEPARTMENT_OTHER): Payer: 59

## 2016-06-06 ENCOUNTER — Ambulatory Visit (HOSPITAL_BASED_OUTPATIENT_CLINIC_OR_DEPARTMENT_OTHER): Payer: 59

## 2016-06-06 VITALS — BP 108/70 | HR 73 | Temp 98.1°F | Resp 16

## 2016-06-06 DIAGNOSIS — D696 Thrombocytopenia, unspecified: Secondary | ICD-10-CM | POA: Diagnosis not present

## 2016-06-06 DIAGNOSIS — Z5112 Encounter for antineoplastic immunotherapy: Secondary | ICD-10-CM | POA: Diagnosis not present

## 2016-06-06 DIAGNOSIS — D61818 Other pancytopenia: Secondary | ICD-10-CM | POA: Diagnosis not present

## 2016-06-06 DIAGNOSIS — C9001 Multiple myeloma in remission: Secondary | ICD-10-CM

## 2016-06-06 LAB — COMPREHENSIVE METABOLIC PANEL
ALT: 15 U/L (ref 0–55)
ANION GAP: 8 meq/L (ref 3–11)
AST: 20 U/L (ref 5–34)
Albumin: 3.8 g/dL (ref 3.5–5.0)
Alkaline Phosphatase: 56 U/L (ref 40–150)
BILIRUBIN TOTAL: 0.4 mg/dL (ref 0.20–1.20)
BUN: 13.2 mg/dL (ref 7.0–26.0)
CALCIUM: 8.9 mg/dL (ref 8.4–10.4)
CO2: 27 meq/L (ref 22–29)
Chloride: 105 mEq/L (ref 98–109)
Creatinine: 0.8 mg/dL (ref 0.6–1.1)
EGFR: 85 mL/min/{1.73_m2} — ABNORMAL LOW (ref >90)
Glucose: 95 mg/dl (ref 70–140)
Potassium: 3.9 mEq/L (ref 3.5–5.1)
Sodium: 140 mEq/L (ref 136–145)
TOTAL PROTEIN: 5.7 g/dL — AB (ref 6.4–8.3)

## 2016-06-06 LAB — CBC WITH DIFFERENTIAL/PLATELET
BASO%: 2.5 % — AB (ref 0.0–2.0)
Basophils Absolute: 0.1 10*3/uL (ref 0.0–0.1)
EOS%: 14.6 % — ABNORMAL HIGH (ref 0.0–7.0)
Eosinophils Absolute: 0.6 10*3/uL — ABNORMAL HIGH (ref 0.0–0.5)
HEMATOCRIT: 36.4 % (ref 34.8–46.6)
HGB: 12.4 g/dL (ref 11.6–15.9)
LYMPH#: 1.1 10*3/uL (ref 0.9–3.3)
LYMPH%: 28.9 % (ref 14.0–49.7)
MCH: 33.7 pg (ref 25.1–34.0)
MCHC: 34 g/dL (ref 31.5–36.0)
MCV: 99.1 fL (ref 79.5–101.0)
MONO#: 0.6 10*3/uL (ref 0.1–0.9)
MONO%: 15.1 % — ABNORMAL HIGH (ref 0.0–14.0)
NEUT%: 38.9 % (ref 38.4–76.8)
NEUTROS ABS: 1.5 10*3/uL (ref 1.5–6.5)
PLATELETS: 93 10*3/uL — AB (ref 145–400)
RBC: 3.67 10*6/uL — AB (ref 3.70–5.45)
RDW: 14.1 % (ref 11.2–14.5)
WBC: 4 10*3/uL (ref 3.9–10.3)

## 2016-06-06 MED ORDER — ACETAMINOPHEN 325 MG PO TABS
650.0000 mg | ORAL_TABLET | Freq: Once | ORAL | Status: AC
  Administered 2016-06-06: 650 mg via ORAL

## 2016-06-06 MED ORDER — PROCHLORPERAZINE MALEATE 10 MG PO TABS
10.0000 mg | ORAL_TABLET | Freq: Once | ORAL | Status: AC
  Administered 2016-06-06: 10 mg via ORAL

## 2016-06-06 MED ORDER — SODIUM CHLORIDE 0.9 % IV SOLN
Freq: Once | INTRAVENOUS | Status: AC
  Administered 2016-06-06: 09:00:00 via INTRAVENOUS

## 2016-06-06 MED ORDER — METHYLPREDNISOLONE SODIUM SUCC 125 MG IJ SOLR
125.0000 mg | Freq: Once | INTRAMUSCULAR | Status: AC
  Administered 2016-06-06: 125 mg via INTRAVENOUS

## 2016-06-06 MED ORDER — SODIUM CHLORIDE 0.9% FLUSH
10.0000 mL | INTRAVENOUS | Status: DC | PRN
  Administered 2016-06-06: 10 mL
  Filled 2016-06-06: qty 10

## 2016-06-06 MED ORDER — SODIUM CHLORIDE 0.9 % IV SOLN
15.5000 mg/kg | Freq: Once | INTRAVENOUS | Status: AC
  Administered 2016-06-06: 900 mg via INTRAVENOUS
  Filled 2016-06-06: qty 40

## 2016-06-06 MED ORDER — PROCHLORPERAZINE MALEATE 10 MG PO TABS
ORAL_TABLET | ORAL | Status: AC
  Filled 2016-06-06: qty 1

## 2016-06-06 MED ORDER — ACYCLOVIR 400 MG PO TABS: 400.0000 mg | ORAL_TABLET | Freq: Two times a day (BID) | ORAL | 11 refills | Status: DC

## 2016-06-06 MED ORDER — ACETAMINOPHEN 325 MG PO TABS
ORAL_TABLET | ORAL | Status: AC
  Filled 2016-06-06: qty 2

## 2016-06-06 MED ORDER — DIPHENHYDRAMINE HCL 25 MG PO CAPS
ORAL_CAPSULE | ORAL | Status: AC
  Filled 2016-06-06: qty 2

## 2016-06-06 MED ORDER — HEPARIN SOD (PORK) LOCK FLUSH 100 UNIT/ML IV SOLN
500.0000 [IU] | Freq: Once | INTRAVENOUS | Status: AC | PRN
  Administered 2016-06-06: 500 [IU]
  Filled 2016-06-06: qty 5

## 2016-06-06 MED ORDER — DIPHENHYDRAMINE HCL 25 MG PO CAPS
50.0000 mg | ORAL_CAPSULE | Freq: Once | ORAL | Status: AC
  Administered 2016-06-06: 50 mg via ORAL

## 2016-06-06 MED ORDER — METHYLPREDNISOLONE SODIUM SUCC 125 MG IJ SOLR
INTRAMUSCULAR | Status: AC
  Filled 2016-06-06: qty 2

## 2016-06-06 NOTE — Progress Notes (Signed)
[facility] OFFICE PROGRESS NOTE  Patient Care Team: [name], MD as PCP - General [name], MD as Consulting Physician (Oncology) [name], MD as Consulting Physician (Neurosurgery) [name], MD as Consulting Physician (Hematology and Oncology)  SUMMARY OF ONCOLOGIC HISTORY:   Multiple myeloma in remission [facility])   07/21/2007 Bone Marrow Biopsy    Case #: [id number] Bone marrow showed myeloma      07/21/2007 Miscellaneous    She was diagnosed in May 2009. Had several cycles of RVD      10/14/2007 Bone Marrow Biopsy    Case #: [id number] repeat bone marrow biopsy showed good response to Rx      12/31/2007 Bone Marrow Transplant    She had autologous BMT at [facility]      07/19/2009 Bone Marrow Biopsy    Case #: [id number] Bone marrow biopsy showed only 1% plasma cells      10/10/2010 Bone Marrow Biopsy    [id number] Bone marrow biopsy showed 2% plasma cells      09/25/2011 Bone Marrow Biopsy    Accession #: [id number] Bone marrow only showed 4% plasma cells with ligh chain excess      10/14/2011 - 06/13/2013 Chemotherapy    She received Revlimid only, discontinued due to pancytopenia      06/18/2015 - 09/13/2015 Chemotherapy    She resumed taking Revlimid and dexamethasone. Treatment is stopped due to minimum response and pancytopenia      10/19/2015 -  Chemotherapy    The patient had Velcade for chemotherapy treatment along with weekly Daratumumab. Last dose of Velcade on 02/29/16, stopped due to progression. Pomalidomide added on 03/14/16      04/11/2016 Adverse Reaction    Delay resumption of Pomalyst cycle 2 until 04/16/16 due to neutropenia       INTERVAL HISTORY: Please see below for problem oriented charting. She is seen in the infusion room She denies new bone pain Denies recent infection She tolerated recent treatment well The patient denies any recent signs or symptoms of bleeding such as spontaneous epistaxis, hematuria or  hematochezia. She is inquiring about Biotene for stronger nails  REVIEW OF SYSTEMS:   Constitutional: Denies fevers, chills or abnormal weight loss Eyes: Denies blurriness of vision Ears, nose, mouth, throat, and face: Denies mucositis or sore throat Respiratory: Denies cough, dyspnea or wheezes Cardiovascular: Denies palpitation, chest discomfort or lower extremity swelling Gastrointestinal:  Denies nausea, heartburn or change in bowel habits Skin: Denies abnormal skin rashes Lymphatics: Denies new lymphadenopathy or easy bruising Neurological:Denies numbness, tingling or new weaknesses Behavioral/Psych: Mood is stable, no new changes  All other systems were reviewed with the patient and are negative.  I have reviewed the past medical history, past surgical history, social history and family history with the patient and they are unchanged from previous note.  ALLERGIES:  is allergic to hydromorphone.  MEDICATIONS:  Current Outpatient Prescriptions  Medication Sig Dispense Refill  . acyclovir (ZOVIRAX) 400 MG tablet Take 1 tablet (400 mg total) by mouth 2 (two) times daily. 60 tablet 11  . aspirin 81 MG tablet Take 81 mg by mouth.    . cholecalciferol (VITAMIN D) 1000 UNITS tablet Take 1,000 Units by mouth daily.    . Multiple Vitamins-Minerals (ONE-A-DAY 50 PLUS PO) Take by mouth daily.    Marland Kitchen oxycodone (OXY-IR) 5 MG capsule Take 5 mg by mouth every 4 (four) hours as needed. pain    . Oxycodone HCl (OXYCONTIN) 60 MG  TB12 Take 1 tablet by mouth 2 (two) times daily.    . pantoprazole (PROTONIX) 40 MG tablet Take 1 tablet (40 mg total) by mouth daily. 30 tablet 9  . pomalidomide (POMALYST) 2 MG capsule Take with water on days 1-21. Repeat every 28 days. 21 capsule 11   No current facility-administered medications for this visit.    Facility-Administered Medications Ordered in Other Visits  Medication Dose Route Frequency Provider Last Rate Last Dose  . heparin lock flush 100 unit/mL   500 Units Intracatheter Once PRN [name], MD      . sodium chloride flush (NS) 0.9 % injection 10 mL  10 mL Intracatheter PRN [name], MD        PHYSICAL EXAMINATION: ECOG PERFORMANCE STATUS: 1 - Symptomatic but completely ambulatory GENERAL:alert, no distress and comfortable SKIN: skin color, texture, turgor are normal, no rashes or significant lesions EYES: normal, Conjunctiva are pink and non-injected, sclera clear OROPHARYNX:no exudate, no erythema and lips, buccal mucosa, and tongue normal  NECK: supple, thyroid normal size, non-tender, without nodularity LYMPH:  no palpable lymphadenopathy in the cervical, axillary or inguinal LUNGS: clear to auscultation and percussion with normal breathing effort HEART: regular rate & rhythm and no murmurs and no lower extremity edema ABDOMEN:abdomen soft, non-tender and normal bowel sounds Musculoskeletal:no cyanosis of digits and no clubbing  NEURO: alert & oriented x 3 with fluent speech, no focal motor/sensory deficits  LABORATORY DATA:  I have reviewed the data as listed    Component Value Date/Time   NA 140 06/06/2016 0823   K 3.9 06/06/2016 0823   CL 107 08/30/2012 1055   CO2 27 06/06/2016 0823   GLUCOSE 95 06/06/2016 0823   GLUCOSE 102 (H) 08/30/2012 1055   BUN 13.2 06/06/2016 0823   CREATININE 0.8 06/06/2016 0823   CALCIUM 8.9 06/06/2016 0823   PROT 5.7 (L) 06/06/2016 0823   ALBUMIN 3.8 06/06/2016 0823   AST 20 06/06/2016 0823   ALT 15 06/06/2016 0823   ALKPHOS 56 06/06/2016 0823   BILITOT 0.40 06/06/2016 0823    No results found for: SPEP, UPEP  Lab Results  Component Value Date   WBC 4.0 06/06/2016   NEUTROABS 1.5 06/06/2016   HGB 12.4 06/06/2016   HCT 36.4 06/06/2016   MCV 99.1 06/06/2016   PLT 93 (L) 06/06/2016      Chemistry      Component Value Date/Time   NA 140 06/06/2016 0823   K 3.9 06/06/2016 0823   CL 107 08/30/2012 1055   CO2 27 06/06/2016 0823   BUN 13.2 06/06/2016 0823   CREATININE 0.8  06/06/2016 0823      Component Value Date/Time   CALCIUM 8.9 06/06/2016 0823   ALKPHOS 56 06/06/2016 0823   AST 20 06/06/2016 0823   ALT 15 06/06/2016 0823   BILITOT 0.40 06/06/2016 0823     ASSESSMENT & PLAN:  Multiple myeloma in remission ([place]) She is delighted to see that despite multiple interruption to the dose of Pomalyst she has positive response to treatment in addition to treatment of Daratumumab. For now, due to recent severe pancytopenia, the patient is advised to take Pomalyst every other day until her current prescription is finished, approximately 3 weeks from now Afterwards, she will start reduced dose Pomalyst at 2 mg daily starting around 07/04/2016. She will continue Daratumumab once every 4 weeks, her next treatment is moved to 5 weeks due to scheduling She will continue to take aspirin for DVT prophylaxis. She  is advised to take calcium with vitamin D supplement and will continue to receive Zometa every 3 months, next due on 07/04/2016  Thrombocytopenia The cause is unknown. It is mild and there is little change compared from previous platelet count. The patient denies recent history of bleeding such as epistaxis, hematuria or hematochezia. She is asymptomatic from the thrombocytopenia. I will observe for now.    No orders of the defined types were placed in this encounter.  All questions were answered. The patient knows to call the clinic with any problems, questions or concerns. No barriers to learning was detected. I spent 15 minutes counseling the patient face to face. The total time spent in the appointment was 20 minutes and more than 50% was on counseling and review of test results     [name], MD 06/06/2016 2:10 PM

## 2016-06-06 NOTE — Assessment & Plan Note (Signed)
She is delighted to see that despite multiple interruption to the dose of Pomalyst she has positive response to treatment in addition to treatment of Daratumumab. For now, due to recent severe pancytopenia, the patient is advised to take Pomalyst every other day until her current prescription is finished, approximately 3 weeks from now Afterwards, she will start reduced dose Pomalyst at 2 mg daily starting around 07/04/2016. She will continue Daratumumab once every 4 weeks, her next treatment is moved to 5 weeks due to scheduling She will continue to take aspirin for DVT prophylaxis. She is advised to take calcium with vitamin D supplement and will continue to receive Zometa every 3 months, next due on 07/04/2016

## 2016-06-06 NOTE — Telephone Encounter (Signed)
Gave patient AVS and calender per 06/06/2016 sch message from MD Alvy Bimler,

## 2016-06-06 NOTE — Patient Instructions (Signed)
[facility] Discharge Instructions for Patients Receiving Chemotherapy  Today you received the following chemotherapy agents Darzalex.  To help prevent nausea and vomiting after your treatment, we encourage you to take your nausea medication as directed.  If you develop nausea and vomiting that is not controlled by your nausea medication, call the clinic.   BELOW ARE SYMPTOMS THAT SHOULD BE REPORTED IMMEDIATELY:  *FEVER GREATER THAN 100.5 F  *CHILLS WITH OR WITHOUT FEVER  NAUSEA AND VOMITING THAT IS NOT CONTROLLED WITH YOUR NAUSEA MEDICATION  *UNUSUAL SHORTNESS OF BREATH  *UNUSUAL BRUISING OR BLEEDING  TENDERNESS IN MOUTH AND THROAT WITH OR WITHOUT PRESENCE OF ULCERS  *URINARY PROBLEMS  *BOWEL PROBLEMS  UNUSUAL RASH Items with * indicate a potential emergency and should be followed up as soon as possible.  Feel free to call the clinic you have any questions or concerns. The clinic phone number is [phone].  Please show the CHEMO ALERT CARD at check-in to the Emergency Department and triage nurse.    

## 2016-06-06 NOTE — Assessment & Plan Note (Signed)
The cause is unknown. It is mild and there is little change compared from previous platelet count. The patient denies recent history of bleeding such as epistaxis, hematuria or hematochezia. She is asymptomatic from the thrombocytopenia. I will observe for now.  

## 2016-06-09 ENCOUNTER — Ambulatory Visit: Payer: 59 | Admitting: Hematology and Oncology

## 2016-06-09 LAB — KAPPA/LAMBDA LIGHT CHAINS
IG KAPPA FREE LIGHT CHAIN: 92.4 mg/L — AB (ref 3.3–19.4)
IG LAMBDA FREE LIGHT CHAIN: 3.8 mg/L — AB (ref 5.7–26.3)
Kappa/Lambda FluidC Ratio: 24.32 — ABNORMAL HIGH (ref 0.26–1.65)

## 2016-06-09 LAB — MULTIPLE MYELOMA PANEL, SERUM
ALBUMIN SERPL ELPH-MCNC: 3.7 g/dL (ref 2.9–4.4)
Albumin/Glob SerPl: 2.1 — ABNORMAL HIGH (ref 0.7–1.7)
Alpha 1: 0.2 g/dL (ref 0.0–0.4)
Alpha2 Glob SerPl Elph-Mcnc: 0.6 g/dL (ref 0.4–1.0)
B-Globulin SerPl Elph-Mcnc: 0.8 g/dL (ref 0.7–1.3)
Gamma Glob SerPl Elph-Mcnc: 0.2 g/dL — ABNORMAL LOW (ref 0.4–1.8)
Globulin, Total: 1.8 g/dL — ABNORMAL LOW (ref 2.2–3.9)
IGM (IMMUNOGLOBIN M), SRM: 13 mg/dL — AB (ref 26–217)
IgA, Qn, Serum: 11 mg/dL — ABNORMAL LOW (ref 87–352)
IgG, Qn, Serum: 305 mg/dL — ABNORMAL LOW (ref 700–1600)
TOTAL PROTEIN: 5.5 g/dL — AB (ref 6.0–8.5)

## 2016-06-10 ENCOUNTER — Telehealth: Payer: Self-pay | Admitting: Hematology and Oncology

## 2016-06-10 NOTE — Telephone Encounter (Signed)
I spent a lot of time reviewing her myeloma panel with the patient and her husband separately over the phone and address all the questions Even though the serum light chain has gone up, the M protein has become undetectable I recommend we continue treatment as directed

## 2016-07-03 ENCOUNTER — Other Ambulatory Visit: Payer: Self-pay

## 2016-07-03 DIAGNOSIS — C9001 Multiple myeloma in remission: Secondary | ICD-10-CM

## 2016-07-03 MED ORDER — POMALIDOMIDE 2 MG PO CAPS: ORAL_CAPSULE | ORAL | 11 refills | Status: DC

## 2016-07-04 ENCOUNTER — Ambulatory Visit: Payer: 59

## 2016-07-04 ENCOUNTER — Other Ambulatory Visit (HOSPITAL_BASED_OUTPATIENT_CLINIC_OR_DEPARTMENT_OTHER): Payer: 59

## 2016-07-04 ENCOUNTER — Ambulatory Visit (HOSPITAL_BASED_OUTPATIENT_CLINIC_OR_DEPARTMENT_OTHER): Payer: 59

## 2016-07-04 DIAGNOSIS — C9001 Multiple myeloma in remission: Secondary | ICD-10-CM

## 2016-07-04 DIAGNOSIS — Z95828 Presence of other vascular implants and grafts: Secondary | ICD-10-CM

## 2016-07-04 DIAGNOSIS — C9002 Multiple myeloma in relapse: Secondary | ICD-10-CM

## 2016-07-04 LAB — CBC WITH DIFFERENTIAL/PLATELET
BASO%: 3.3 % — AB (ref 0.0–2.0)
Basophils Absolute: 0.1 10*3/uL (ref 0.0–0.1)
EOS%: 9.8 % — ABNORMAL HIGH (ref 0.0–7.0)
Eosinophils Absolute: 0.4 10*3/uL (ref 0.0–0.5)
HCT: 37.8 % (ref 34.8–46.6)
HGB: 12.6 g/dL (ref 11.6–15.9)
LYMPH%: 33.4 % (ref 14.0–49.7)
MCH: 33.2 pg (ref 25.1–34.0)
MCHC: 33.3 g/dL (ref 31.5–36.0)
MCV: 99.5 fL (ref 79.5–101.0)
MONO#: 0.5 10*3/uL (ref 0.1–0.9)
MONO%: 12.8 % (ref 0.0–14.0)
NEUT%: 40.7 % (ref 38.4–76.8)
NEUTROS ABS: 1.6 10*3/uL (ref 1.5–6.5)
Platelets: 109 10*3/uL — ABNORMAL LOW (ref 145–400)
RBC: 3.8 10*6/uL (ref 3.70–5.45)
RDW: 13.6 % (ref 11.2–14.5)
WBC: 4 10*3/uL (ref 3.9–10.3)
lymph#: 1.3 10*3/uL (ref 0.9–3.3)

## 2016-07-04 LAB — COMPREHENSIVE METABOLIC PANEL
ALT: 14 U/L (ref 0–55)
AST: 23 U/L (ref 5–34)
Albumin: 4 g/dL (ref 3.5–5.0)
Alkaline Phosphatase: 50 U/L (ref 40–150)
Anion Gap: 10 mEq/L (ref 3–11)
BILIRUBIN TOTAL: 0.44 mg/dL (ref 0.20–1.20)
BUN: 13.1 mg/dL (ref 7.0–26.0)
CO2: 26 meq/L (ref 22–29)
Calcium: 8.7 mg/dL (ref 8.4–10.4)
Chloride: 106 mEq/L (ref 98–109)
Creatinine: 0.8 mg/dL (ref 0.6–1.1)
EGFR: 85 mL/min/{1.73_m2} — AB (ref >90)
GLUCOSE: 89 mg/dL (ref 70–140)
Potassium: 3.7 mEq/L (ref 3.5–5.1)
SODIUM: 142 meq/L (ref 136–145)
TOTAL PROTEIN: 5.9 g/dL — AB (ref 6.4–8.3)

## 2016-07-04 MED ORDER — SODIUM CHLORIDE 0.9 % IJ SOLN
10.0000 mL | INTRAMUSCULAR | Status: DC | PRN
  Administered 2016-07-04 (×2): 10 mL via INTRAVENOUS
  Filled 2016-07-04: qty 10

## 2016-07-04 MED ORDER — ALTEPLASE 2 MG IJ SOLR
2.0000 mg | Freq: Once | INTRAMUSCULAR | Status: DC | PRN
  Filled 2016-07-04: qty 2

## 2016-07-04 MED ORDER — HEPARIN SOD (PORK) LOCK FLUSH 100 UNIT/ML IV SOLN
500.0000 [IU] | Freq: Once | INTRAVENOUS | Status: AC | PRN
  Administered 2016-07-04: 500 [IU] via INTRAVENOUS
  Filled 2016-07-04: qty 5

## 2016-07-04 MED ORDER — SODIUM CHLORIDE 0.9 % IJ SOLN
10.0000 mL | INTRAMUSCULAR | Status: DC | PRN
  Filled 2016-07-04: qty 10

## 2016-07-04 MED ORDER — ZOLEDRONIC ACID 4 MG/100ML IV SOLN
4.0000 mg | Freq: Once | INTRAVENOUS | Status: AC
  Administered 2016-07-04: 4 mg via INTRAVENOUS
  Filled 2016-07-04: qty 100

## 2016-07-04 NOTE — Patient Instructions (Signed)

## 2016-07-04 NOTE — Progress Notes (Signed)
Pt requested that port be accessed in treatment room and not flush room. Jessica Cates LPN

## 2016-07-07 ENCOUNTER — Other Ambulatory Visit: Payer: Self-pay | Admitting: Hematology and Oncology

## 2016-07-07 DIAGNOSIS — C9001 Multiple myeloma in remission: Secondary | ICD-10-CM

## 2016-07-11 ENCOUNTER — Ambulatory Visit (HOSPITAL_BASED_OUTPATIENT_CLINIC_OR_DEPARTMENT_OTHER): Payer: 59

## 2016-07-11 ENCOUNTER — Telehealth: Payer: Self-pay | Admitting: Hematology and Oncology

## 2016-07-11 ENCOUNTER — Other Ambulatory Visit: Payer: 59

## 2016-07-11 ENCOUNTER — Ambulatory Visit (HOSPITAL_BASED_OUTPATIENT_CLINIC_OR_DEPARTMENT_OTHER): Payer: 59 | Admitting: Hematology and Oncology

## 2016-07-11 ENCOUNTER — Other Ambulatory Visit (HOSPITAL_BASED_OUTPATIENT_CLINIC_OR_DEPARTMENT_OTHER): Payer: 59

## 2016-07-11 VITALS — BP 119/77 | HR 70 | Temp 98.1°F | Resp 16

## 2016-07-11 DIAGNOSIS — C9001 Multiple myeloma in remission: Secondary | ICD-10-CM

## 2016-07-11 DIAGNOSIS — Z5112 Encounter for antineoplastic immunotherapy: Secondary | ICD-10-CM

## 2016-07-11 DIAGNOSIS — D61810 Antineoplastic chemotherapy induced pancytopenia: Secondary | ICD-10-CM

## 2016-07-11 DIAGNOSIS — T451X5A Adverse effect of antineoplastic and immunosuppressive drugs, initial encounter: Secondary | ICD-10-CM

## 2016-07-11 LAB — COMPREHENSIVE METABOLIC PANEL
ALBUMIN: 4.1 g/dL (ref 3.5–5.0)
ALK PHOS: 53 U/L (ref 40–150)
ALT: 17 U/L (ref 0–55)
AST: 22 U/L (ref 5–34)
Anion Gap: 12 mEq/L — ABNORMAL HIGH (ref 3–11)
BUN: 17 mg/dL (ref 7.0–26.0)
CHLORIDE: 104 meq/L (ref 98–109)
CO2: 27 mEq/L (ref 22–29)
Calcium: 9.2 mg/dL (ref 8.4–10.4)
Creatinine: 0.8 mg/dL (ref 0.6–1.1)
EGFR: 88 mL/min/{1.73_m2} — AB (ref >90)
Glucose: 92 mg/dl (ref 70–140)
POTASSIUM: 4 meq/L (ref 3.5–5.1)
SODIUM: 143 meq/L (ref 136–145)
Total Bilirubin: 0.54 mg/dL (ref 0.20–1.20)
Total Protein: 6 g/dL — ABNORMAL LOW (ref 6.4–8.3)

## 2016-07-11 LAB — CBC WITH DIFFERENTIAL/PLATELET
BASO%: 4.6 % — ABNORMAL HIGH (ref 0.0–2.0)
BASOS ABS: 0.2 10*3/uL — AB (ref 0.0–0.1)
EOS ABS: 0.4 10*3/uL (ref 0.0–0.5)
EOS%: 10.6 % — ABNORMAL HIGH (ref 0.0–7.0)
HCT: 38.4 % (ref 34.8–46.6)
HGB: 12.7 g/dL (ref 11.6–15.9)
LYMPH#: 1.2 10*3/uL (ref 0.9–3.3)
LYMPH%: 30 % (ref 14.0–49.7)
MCH: 33.2 pg (ref 25.1–34.0)
MCHC: 33.1 g/dL (ref 31.5–36.0)
MCV: 100.4 fL (ref 79.5–101.0)
MONO#: 0.6 10*3/uL (ref 0.1–0.9)
MONO%: 16.1 % — AB (ref 0.0–14.0)
NEUT#: 1.5 10*3/uL (ref 1.5–6.5)
NEUT%: 38.7 % (ref 38.4–76.8)
Platelets: 111 10*3/uL — ABNORMAL LOW (ref 145–400)
RBC: 3.83 10*6/uL (ref 3.70–5.45)
RDW: 14 % (ref 11.2–14.5)
WBC: 3.8 10*3/uL — ABNORMAL LOW (ref 3.9–10.3)

## 2016-07-11 MED ORDER — ACETAMINOPHEN 325 MG PO TABS
650.0000 mg | ORAL_TABLET | Freq: Once | ORAL | Status: AC
  Administered 2016-07-11: 650 mg via ORAL

## 2016-07-11 MED ORDER — ACETAMINOPHEN 325 MG PO TABS
ORAL_TABLET | ORAL | Status: AC
  Filled 2016-07-11: qty 2

## 2016-07-11 MED ORDER — DIPHENHYDRAMINE HCL 25 MG PO CAPS
50.0000 mg | ORAL_CAPSULE | Freq: Once | ORAL | Status: AC
  Administered 2016-07-11: 50 mg via ORAL

## 2016-07-11 MED ORDER — HEPARIN SOD (PORK) LOCK FLUSH 100 UNIT/ML IV SOLN
500.0000 [IU] | Freq: Once | INTRAVENOUS | Status: AC | PRN
  Administered 2016-07-11: 500 [IU]
  Filled 2016-07-11: qty 5

## 2016-07-11 MED ORDER — PROCHLORPERAZINE MALEATE 10 MG PO TABS
ORAL_TABLET | ORAL | Status: AC
  Filled 2016-07-11: qty 1

## 2016-07-11 MED ORDER — PROCHLORPERAZINE MALEATE 10 MG PO TABS
10.0000 mg | ORAL_TABLET | Freq: Once | ORAL | Status: AC
  Administered 2016-07-11: 10 mg via ORAL

## 2016-07-11 MED ORDER — SODIUM CHLORIDE 0.9% FLUSH
10.0000 mL | INTRAVENOUS | Status: DC | PRN
  Administered 2016-07-11: 10 mL
  Filled 2016-07-11: qty 10

## 2016-07-11 MED ORDER — SODIUM CHLORIDE 0.9 % IV SOLN
15.5000 mg/kg | Freq: Once | INTRAVENOUS | Status: AC
  Administered 2016-07-11: 900 mg via INTRAVENOUS
  Filled 2016-07-11: qty 40

## 2016-07-11 MED ORDER — SODIUM CHLORIDE 0.9 % IV SOLN
Freq: Once | INTRAVENOUS | Status: AC
  Administered 2016-07-11: 10:00:00 via INTRAVENOUS

## 2016-07-11 MED ORDER — DIPHENHYDRAMINE HCL 25 MG PO CAPS
ORAL_CAPSULE | ORAL | Status: AC
  Filled 2016-07-11: qty 2

## 2016-07-11 MED ORDER — METHYLPREDNISOLONE SODIUM SUCC 125 MG IJ SOLR
125.0000 mg | Freq: Once | INTRAMUSCULAR | Status: AC
  Administered 2016-07-11: 125 mg via INTRAVENOUS

## 2016-07-11 MED ORDER — METHYLPREDNISOLONE SODIUM SUCC 125 MG IJ SOLR
INTRAMUSCULAR | Status: AC
  Filled 2016-07-11: qty 2

## 2016-07-11 NOTE — Telephone Encounter (Signed)
Appointments scheduled per 5.4.18 LOS. Patient given AVS report and calendars with future scheduled appointments. °

## 2016-07-11 NOTE — Patient Instructions (Signed)
[facility] Discharge Instructions for Patients Receiving Chemotherapy  Today you received the following chemotherapy agents Darzalex.  To help prevent nausea and vomiting after your treatment, we encourage you to take your nausea medication as directed.  If you develop nausea and vomiting that is not controlled by your nausea medication, call the clinic.   BELOW ARE SYMPTOMS THAT SHOULD BE REPORTED IMMEDIATELY:  *FEVER GREATER THAN 100.5 F  *CHILLS WITH OR WITHOUT FEVER  NAUSEA AND VOMITING THAT IS NOT CONTROLLED WITH YOUR NAUSEA MEDICATION  *UNUSUAL SHORTNESS OF BREATH  *UNUSUAL BRUISING OR BLEEDING  TENDERNESS IN MOUTH AND THROAT WITH OR WITHOUT PRESENCE OF ULCERS  *URINARY PROBLEMS  *BOWEL PROBLEMS  UNUSUAL RASH Items with * indicate a potential emergency and should be followed up as soon as possible.  Feel free to call the clinic you have any questions or concerns. The clinic phone number is [phone].  Please show the CHEMO ALERT CARD at check-in to the Emergency Department and triage nurse.    

## 2016-07-13 ENCOUNTER — Encounter: Payer: Self-pay | Admitting: Hematology and Oncology

## 2016-07-13 NOTE — Assessment & Plan Note (Signed)
She is delighted to see that despite multiple interruption to the dose of Pomalyst she has positive response to treatment in addition to treatment of Daratumumab. For now, due to recent severe pancytopenia, the patient is advised to take Pomalyst every other day until her prescription is finished Currently, she is at reduced dose Pomalyst at 2 mg daily starting around 07/04/2016. She will continue Daratumumab once every 4 weeks She will continue to take aspirin for DVT prophylaxis. She is advised to take calcium with vitamin D supplement and will continue to receive Zometa every 3 months, next due on around July 2018

## 2016-07-13 NOTE — Progress Notes (Signed)
[place] OFFICE PROGRESS NOTE  Patient Care Team: [name], MD as PCP - General [name], MD as Consulting Physician (Oncology) [name], MD as Consulting Physician (Neurosurgery) [name], MD as Consulting Physician (Hematology and Oncology)  SUMMARY OF ONCOLOGIC HISTORY:   Multiple myeloma in remission [facility])   07/21/2007 Bone Marrow Biopsy    Case #: [id number] Bone marrow showed myeloma      07/21/2007 Miscellaneous    She was diagnosed in May 2009. Had several cycles of RVD      10/14/2007 Bone Marrow Biopsy    Case #: [id number] repeat bone marrow biopsy showed good response to Rx      12/31/2007 Bone Marrow Transplant    She had autologous BMT at [facility]      07/19/2009 Bone Marrow Biopsy    Case #: [id number] Bone marrow biopsy showed only 1% plasma cells      10/10/2010 Bone Marrow Biopsy    [id number] Bone marrow biopsy showed 2% plasma cells      09/25/2011 Bone Marrow Biopsy    Accession #: [id number] Bone marrow only showed 4% plasma cells with ligh chain excess      10/14/2011 - 06/13/2013 Chemotherapy    She received Revlimid only, discontinued due to pancytopenia      06/18/2015 - 09/13/2015 Chemotherapy    She resumed taking Revlimid and dexamethasone. Treatment is stopped due to minimum response and pancytopenia      10/19/2015 -  Chemotherapy    The patient had Velcade for chemotherapy treatment along with weekly Daratumumab. Last dose of Velcade on 02/29/16, stopped due to progression. Pomalidomide added on 03/14/16      04/11/2016 Adverse Reaction    Delay resumption of Pomalyst cycle 2 until 04/16/16 due to neutropenia       INTERVAL HISTORY: Please see below for problem oriented charting. She is seen in the infusion room She tolerated reduced dose Pomalyst well Denies recent infection No new bone pain. The patient denies any recent signs or symptoms of bleeding such as spontaneous epistaxis, hematuria or  hematochezia.   REVIEW OF SYSTEMS:   Constitutional: Denies fevers, chills or abnormal weight loss Eyes: Denies blurriness of vision Ears, nose, mouth, throat, and face: Denies mucositis or sore throat Respiratory: Denies cough, dyspnea or wheezes Cardiovascular: Denies palpitation, chest discomfort or lower extremity swelling Gastrointestinal:  Denies nausea, heartburn or change in bowel habits Skin: Denies abnormal skin rashes Lymphatics: Denies new lymphadenopathy or easy bruising Neurological:Denies numbness, tingling or new weaknesses Behavioral/Psych: Mood is stable, no new changes  All other systems were reviewed with the patient and are negative.  I have reviewed the past medical history, past surgical history, social history and family history with the patient and they are unchanged from previous note.  ALLERGIES:  is allergic to hydromorphone.  MEDICATIONS:  Current Outpatient Prescriptions  Medication Sig Dispense Refill  . acyclovir (ZOVIRAX) 400 MG tablet Take 1 tablet (400 mg total) by mouth 2 (two) times daily. 60 tablet 11  . aspirin 81 MG tablet Take 81 mg by mouth.    . cholecalciferol (VITAMIN D) 1000 UNITS tablet Take 1,000 Units by mouth daily.    . Multiple Vitamins-Minerals (ONE-A-DAY 50 PLUS PO) Take by mouth daily.    Marland Kitchen oxycodone (OXY-IR) 5 MG capsule Take 5 mg by mouth every 4 (four) hours as needed. pain    . Oxycodone HCl (OXYCONTIN) 60 MG TB12 Take 1 tablet by mouth 2 (  two) times daily.    . pantoprazole (PROTONIX) 40 MG tablet Take 1 tablet (40 mg total) by mouth daily. 30 tablet 9  . pomalidomide (POMALYST) 2 MG capsule Take with water on days 1-21. Repeat every 28 days. 21 capsule 11   No current facility-administered medications for this visit.     PHYSICAL EXAMINATION: ECOG PERFORMANCE STATUS: 0 - Asymptomatic GENERAL:alert, no distress and comfortable SKIN: skin color, texture, turgor are normal, no rashes or significant lesions EYES: normal,  Conjunctiva are pink and non-injected, sclera clear OROPHARYNX:no exudate, no erythema and lips, buccal mucosa, and tongue normal  NECK: supple, thyroid normal size, non-tender, without nodularity LYMPH:  no palpable lymphadenopathy in the cervical, axillary or inguinal LUNGS: clear to auscultation and percussion with normal breathing effort HEART: regular rate & rhythm and no murmurs and no lower extremity edema ABDOMEN:abdomen soft, non-tender and normal bowel sounds Musculoskeletal:no cyanosis of digits and no clubbing  NEURO: alert & oriented x 3 with fluent speech, no focal motor/sensory deficits  LABORATORY DATA:  I have reviewed the data as listed    Component Value Date/Time   NA 143 07/11/2016 0832   K 4.0 07/11/2016 0832   CL 107 08/30/2012 1055   CO2 27 07/11/2016 0832   GLUCOSE 92 07/11/2016 0832   GLUCOSE 102 (H) 08/30/2012 1055   BUN 17.0 07/11/2016 0832   CREATININE 0.8 07/11/2016 0832   CALCIUM 9.2 07/11/2016 0832   PROT 6.0 (L) 07/11/2016 0832   ALBUMIN 4.1 07/11/2016 0832   AST 22 07/11/2016 0832   ALT 17 07/11/2016 0832   ALKPHOS 53 07/11/2016 0832   BILITOT 0.54 07/11/2016 0832    No results found for: SPEP, UPEP  Lab Results  Component Value Date   WBC 3.8 (L) 07/11/2016   NEUTROABS 1.5 07/11/2016   HGB 12.7 07/11/2016   HCT 38.4 07/11/2016   MCV 100.4 07/11/2016   PLT 111 (L) 07/11/2016      Chemistry      Component Value Date/Time   NA 143 07/11/2016 0832   K 4.0 07/11/2016 0832   CL 107 08/30/2012 1055   CO2 27 07/11/2016 0832   BUN 17.0 07/11/2016 0832   CREATININE 0.8 07/11/2016 0832      Component Value Date/Time   CALCIUM 9.2 07/11/2016 0832   ALKPHOS 53 07/11/2016 0832   AST 22 07/11/2016 0832   ALT 17 07/11/2016 0832   BILITOT 0.54 07/11/2016 0832      ASSESSMENT & PLAN:  Multiple myeloma in remission ([place]) She is delighted to see that despite multiple interruption to the dose of Pomalyst she has positive response to  treatment in addition to treatment of Daratumumab. For now, due to recent severe pancytopenia, the patient is advised to take Pomalyst every other day until her prescription is finished Currently, she is at reduced dose Pomalyst at 2 mg daily starting around 07/04/2016. She will continue Daratumumab once every 4 weeks She will continue to take aspirin for DVT prophylaxis. She is advised to take calcium with vitamin D supplement and will continue to receive Zometa every 3 months, next due on around July 2018   Pancytopenia due to antineoplastic chemotherapy [facility]) She has leukopenia and thrombocytopenia, likely due to recent treatment with Pomalyst. We discussed neutropenic precautions. There is no contraindication to remain on antiplatelet agents or anticoagulants as long as the platelet is greater than 50,000. I plan to see her back again in approximately 2 weeks to repeat blood work to make sure  she is able to tolerate even reduced dose Pomalyst     No orders of the defined types were placed in this encounter.  All questions were answered. The patient knows to call the clinic with any problems, questions or concerns. No barriers to learning was detected. I spent 15 minutes counseling the patient face to face. The total time spent in the appointment was 20 minutes and more than 50% was on counseling and review of test results     [name], MD 07/13/2016 7:19 AM

## 2016-07-13 NOTE — Assessment & Plan Note (Signed)
She has leukopenia and thrombocytopenia, likely due to recent treatment with Pomalyst. We discussed neutropenic precautions. There is no contraindication to remain on antiplatelet agents or anticoagulants as long as the platelet is greater than 50,000. I plan to see her back again in approximately 2 weeks to repeat blood work to make sure she is able to tolerate even reduced dose Pomalyst

## 2016-07-14 LAB — KAPPA/LAMBDA LIGHT CHAINS
IG KAPPA FREE LIGHT CHAIN: 111.4 mg/L — AB (ref 3.3–19.4)
IG LAMBDA FREE LIGHT CHAIN: 3 mg/L — AB (ref 5.7–26.3)
KAPPA/LAMBDA FLC RATIO: 37.13 — AB (ref 0.26–1.65)

## 2016-07-17 LAB — MULTIPLE MYELOMA PANEL, SERUM
ALBUMIN/GLOB SERPL: 2.3 — AB (ref 0.7–1.7)
Albumin SerPl Elph-Mcnc: 4.1 g/dL (ref 2.9–4.4)
Alpha 1: 0.2 g/dL (ref 0.0–0.4)
Alpha2 Glob SerPl Elph-Mcnc: 0.6 g/dL (ref 0.4–1.0)
B-Globulin SerPl Elph-Mcnc: 0.8 g/dL (ref 0.7–1.3)
GAMMA GLOB SERPL ELPH-MCNC: 0.2 g/dL — AB (ref 0.4–1.8)
Globulin, Total: 1.8 g/dL — ABNORMAL LOW (ref 2.2–3.9)
IGA/IMMUNOGLOBULIN A, SERUM: 11 mg/dL — AB (ref 87–352)
IGM (IMMUNOGLOBIN M), SRM: 12 mg/dL — AB (ref 26–217)
IgG, Qn, Serum: 307 mg/dL — ABNORMAL LOW (ref 700–1600)
M Protein SerPl Elph-Mcnc: 0.1 g/dL — ABNORMAL HIGH
TOTAL PROTEIN: 5.9 g/dL — AB (ref 6.0–8.5)

## 2016-07-18 ENCOUNTER — Telehealth: Payer: Self-pay

## 2016-07-18 NOTE — Telephone Encounter (Signed)
Husband [name] called and given below message, verbalized understanding.

## 2016-07-18 NOTE — Telephone Encounter (Signed)
-----   Message from [name], MD sent at 07/18/2016  9:16 AM EDT ----- Regarding: labs I tried calling her and husband many times yesterday and this morning Will review plan with them next week Light chains a bit worse Please try again today and let her know ----- Message ----- From: Interface, Lab In Three Zero One Sent: 07/11/2016   9:00 AM To: [name], MD

## 2016-07-21 ENCOUNTER — Other Ambulatory Visit: Payer: Self-pay | Admitting: Hematology and Oncology

## 2016-07-21 DIAGNOSIS — C9001 Multiple myeloma in remission: Secondary | ICD-10-CM

## 2016-07-21 NOTE — Telephone Encounter (Signed)
Please refill electronically 

## 2016-07-23 ENCOUNTER — Other Ambulatory Visit (HOSPITAL_BASED_OUTPATIENT_CLINIC_OR_DEPARTMENT_OTHER): Payer: 59

## 2016-07-23 ENCOUNTER — Telehealth: Payer: Self-pay | Admitting: *Deleted

## 2016-07-23 ENCOUNTER — Ambulatory Visit (HOSPITAL_BASED_OUTPATIENT_CLINIC_OR_DEPARTMENT_OTHER): Payer: 59 | Admitting: Hematology and Oncology

## 2016-07-23 ENCOUNTER — Encounter: Payer: Self-pay | Admitting: Hematology and Oncology

## 2016-07-23 DIAGNOSIS — C9001 Multiple myeloma in remission: Secondary | ICD-10-CM

## 2016-07-23 LAB — COMPREHENSIVE METABOLIC PANEL
ALT: 17 U/L (ref 0–55)
ANION GAP: 7 meq/L (ref 3–11)
AST: 22 U/L (ref 5–34)
Albumin: 3.9 g/dL (ref 3.5–5.0)
Alkaline Phosphatase: 54 U/L (ref 40–150)
BUN: 9.7 mg/dL (ref 7.0–26.0)
CHLORIDE: 107 meq/L (ref 98–109)
CO2: 27 mEq/L (ref 22–29)
Calcium: 8.7 mg/dL (ref 8.4–10.4)
Creatinine: 0.8 mg/dL (ref 0.6–1.1)
EGFR: 86 mL/min/{1.73_m2} — AB (ref >90)
GLUCOSE: 86 mg/dL (ref 70–140)
Potassium: 4 mEq/L (ref 3.5–5.1)
SODIUM: 141 meq/L (ref 136–145)
Total Bilirubin: 0.46 mg/dL (ref 0.20–1.20)
Total Protein: 5.7 g/dL — ABNORMAL LOW (ref 6.4–8.3)

## 2016-07-23 LAB — CBC WITH DIFFERENTIAL/PLATELET
BASO%: 2.6 % — AB (ref 0.0–2.0)
BASOS ABS: 0.1 10*3/uL (ref 0.0–0.1)
EOS%: 9.3 % — ABNORMAL HIGH (ref 0.0–7.0)
Eosinophils Absolute: 0.3 10*3/uL (ref 0.0–0.5)
HCT: 38.9 % (ref 34.8–46.6)
HGB: 13 g/dL (ref 11.6–15.9)
LYMPH#: 0.9 10*3/uL (ref 0.9–3.3)
LYMPH%: 23.1 % (ref 14.0–49.7)
MCH: 33.7 pg (ref 25.1–34.0)
MCHC: 33.5 g/dL (ref 31.5–36.0)
MCV: 100.7 fL (ref 79.5–101.0)
MONO#: 0.7 10*3/uL (ref 0.1–0.9)
MONO%: 18.2 % — ABNORMAL HIGH (ref 0.0–14.0)
NEUT#: 1.8 10*3/uL (ref 1.5–6.5)
NEUT%: 46.8 % (ref 38.4–76.8)
PLATELETS: 150 10*3/uL (ref 145–400)
RBC: 3.86 10*6/uL (ref 3.70–5.45)
RDW: 13.5 % (ref 11.2–14.5)
WBC: 3.7 10*3/uL — ABNORMAL LOW (ref 3.9–10.3)

## 2016-07-23 NOTE — Telephone Encounter (Signed)
Referral called to [facility]. Appt June 18 @ 1:00pm  Pt notified

## 2016-07-23 NOTE — Progress Notes (Signed)
[place] OFFICE PROGRESS NOTE  Patient Care Team: [name], MD as PCP - General [name], MD as Consulting Physician (Oncology) [name], MD as Consulting Physician (Neurosurgery) [name], MD as Consulting Physician (Hematology and Oncology)  SUMMARY OF ONCOLOGIC HISTORY:   Multiple myeloma in remission [facility])   07/21/2007 Bone Marrow Biopsy    Case #: [id number] Bone marrow showed myeloma      07/21/2007 Miscellaneous    She was diagnosed in May 2009. Had several cycles of RVD      10/14/2007 Bone Marrow Biopsy    Case #: [id number] repeat bone marrow biopsy showed good response to Rx      12/31/2007 Bone Marrow Transplant    She had autologous BMT at [facility]      07/19/2009 Bone Marrow Biopsy    Case #: [id number] Bone marrow biopsy showed only 1% plasma cells      10/10/2010 Bone Marrow Biopsy    [id number] Bone marrow biopsy showed 2% plasma cells      09/25/2011 Bone Marrow Biopsy    Accession #: [id number] Bone marrow only showed 4% plasma cells with ligh chain excess      10/14/2011 - 06/13/2013 Chemotherapy    She received Revlimid only, discontinued due to pancytopenia      06/18/2015 - 09/13/2015 Chemotherapy    She resumed taking Revlimid and dexamethasone. Treatment is stopped due to minimum response and pancytopenia      10/19/2015 -  Chemotherapy    The patient had Velcade for chemotherapy treatment along with weekly Daratumumab. Last dose of Velcade on 02/29/16, stopped due to progression. Pomalidomide added on 03/14/16      04/11/2016 Adverse Reaction    Delay resumption of Pomalyst cycle 2 until 04/16/16 due to neutropenia       INTERVAL HISTORY: Please see below for problem oriented charting. She returns with her husband to discuss test results In the meantime, she is not symptomatic The patient denies any recent signs or symptoms of bleeding such as spontaneous epistaxis, hematuria or hematochezia.  REVIEW OF  SYSTEMS:   Constitutional: Denies fevers, chills or abnormal weight loss Eyes: Denies blurriness of vision Ears, nose, mouth, throat, and face: Denies mucositis or sore throat Respiratory: Denies cough, dyspnea or wheezes Cardiovascular: Denies palpitation, chest discomfort or lower extremity swelling Gastrointestinal:  Denies nausea, heartburn or change in bowel habits Skin: Denies abnormal skin rashes Lymphatics: Denies new lymphadenopathy or easy bruising Neurological:Denies numbness, tingling or new weaknesses Behavioral/Psych: Mood is stable, no new changes  All other systems were reviewed with the patient and are negative.  I have reviewed the past medical history, past surgical history, social history and family history with the patient and they are unchanged from previous note.  ALLERGIES:  is allergic to hydromorphone.  MEDICATIONS:  Current Outpatient Prescriptions  Medication Sig Dispense Refill  . acyclovir (ZOVIRAX) 400 MG tablet Take 1 tablet (400 mg total) by mouth 2 (two) times daily. 60 tablet 11  . aspirin 81 MG tablet Take 81 mg by mouth.    . cholecalciferol (VITAMIN D) 1000 UNITS tablet Take 1,000 Units by mouth daily.    . Multiple Vitamins-Minerals (ONE-A-DAY 50 PLUS PO) Take by mouth daily.    Marland Kitchen oxycodone (OXY-IR) 5 MG capsule Take 5 mg by mouth every 4 (four) hours as needed. pain    . Oxycodone HCl (OXYCONTIN) 60 MG TB12 Take 1 tablet by mouth 2 (two) times daily.    Marland Kitchen  pantoprazole (PROTONIX) 40 MG tablet Take 1 tablet (40 mg total) by mouth daily. 30 tablet 9  . pomalidomide (POMALYST) 2 MG capsule Take with water on days 1-21. Repeat every 28 days. 21 capsule 11  . pomalidomide (POMALYST) 2 MG capsule Take 1 capsule (2 mg total) by mouth daily. Take 1 capsule (2 mg) by mouth once daily for 21 days, and then off for 7 days. 21 capsule 0   No current facility-administered medications for this visit.     PHYSICAL EXAMINATION: ECOG PERFORMANCE STATUS: 1 -  Symptomatic but completely ambulatory  Vitals:   07/23/16 1144  BP: 131/74  Pulse: 74  Resp: 18  Temp: 98.1 F (36.7 C)   Filed Weights   07/23/16 1144  Weight: 130 lb 1.6 oz (59 kg)    GENERAL:alert, no distress and comfortable SKIN: skin color, texture, turgor are normal, no rashes or significant lesions EYES: normal, Conjunctiva are pink and non-injected, sclera clear Musculoskeletal:no cyanosis of digits and no clubbing  NEURO: alert & oriented x 3 with fluent speech, no focal motor/sensory deficits  LABORATORY DATA:  I have reviewed the data as listed    Component Value Date/Time   NA 141 07/23/2016 1121   K 4.0 07/23/2016 1121   CL 107 08/30/2012 1055   CO2 27 07/23/2016 1121   GLUCOSE 86 07/23/2016 1121   GLUCOSE 102 (H) 08/30/2012 1055   BUN 9.7 07/23/2016 1121   CREATININE 0.8 07/23/2016 1121   CALCIUM 8.7 07/23/2016 1121   PROT 5.7 (L) 07/23/2016 1121   ALBUMIN 3.9 07/23/2016 1121   AST 22 07/23/2016 1121   ALT 17 07/23/2016 1121   ALKPHOS 54 07/23/2016 1121   BILITOT 0.46 07/23/2016 1121    No results found for: SPEP, UPEP  Lab Results  Component Value Date   WBC 3.7 (L) 07/23/2016   NEUTROABS 1.8 07/23/2016   HGB 13.0 07/23/2016   HCT 38.9 07/23/2016   MCV 100.7 07/23/2016   PLT 150 07/23/2016      Chemistry      Component Value Date/Time   NA 141 07/23/2016 1121   K 4.0 07/23/2016 1121   CL 107 08/30/2012 1055   CO2 27 07/23/2016 1121   BUN 9.7 07/23/2016 1121   CREATININE 0.8 07/23/2016 1121      Component Value Date/Time   CALCIUM 8.7 07/23/2016 1121   ALKPHOS 54 07/23/2016 1121   AST 22 07/23/2016 1121   ALT 17 07/23/2016 1121   BILITOT 0.46 07/23/2016 1121      ASSESSMENT & PLAN:  Multiple myeloma in remission ([place]) I have a long discussion with the patient and her husband. Her serum light chain continues to get worse Granted, we have recent dose adjustment for Pomalyst due to pancytopenia. However, I am still concerned  that the current treatment is no longer working We reviewed the guidelines We also discussed about potential treatment including Kyprolis/Cytoxan/dexamethasone, Kyprolis/Pomalyst and dexamethasone, or Velcade/Pomalyst and dexamethasone. The Velcade was discontinued due to peripheral neuropathy but technically she did not progress on that I felt that at this point, she probably has progressed on Daratumumab. I think it is reasonable to try to continue Pomalyst for little longer We also discussed about referral to [facility] for second opinion For now, the patient wants to continue on Pomalyst and Daratumumab for 1 more cycle, to be given on August 08, 2016. In the meantime, she is interested for second opinion at [facility].  We will proceed  to set up a referral.   No orders of the defined types were placed in this encounter.  All questions were answered. The patient knows to call the clinic with any problems, questions or concerns. No barriers to learning was detected. I spent 20 minutes counseling the patient face to face. The total time spent in the appointment was 30 minutes and more than 50% was on counseling and review of test results     [name], MD 07/23/2016 3:39 PM

## 2016-07-23 NOTE — Assessment & Plan Note (Signed)
I have a long discussion with the patient and her husband. Her serum light chain continues to get worse Granted, we have recent dose adjustment for Pomalyst due to pancytopenia. However, I am still concerned that the current treatment is no longer working We reviewed the guidelines We also discussed about potential treatment including Kyprolis/Cytoxan/dexamethasone, Kyprolis/Pomalyst and dexamethasone, or Velcade/Pomalyst and dexamethasone. The Velcade was discontinued due to peripheral neuropathy but technically she did not progress on that I felt that at this point, she probably has progressed on Daratumumab. I think it is reasonable to try to continue Pomalyst for little longer We also discussed about referral to [facility] for second opinion For now, the patient wants to continue on Pomalyst and Daratumumab for 1 more cycle, to be given on August 08, 2016. In the meantime, she is interested for second opinion at [facility].  We will proceed to set up a referral.

## 2016-08-08 ENCOUNTER — Encounter: Payer: Self-pay | Admitting: Hematology and Oncology

## 2016-08-08 ENCOUNTER — Ambulatory Visit (HOSPITAL_BASED_OUTPATIENT_CLINIC_OR_DEPARTMENT_OTHER): Payer: 59

## 2016-08-08 ENCOUNTER — Ambulatory Visit (HOSPITAL_BASED_OUTPATIENT_CLINIC_OR_DEPARTMENT_OTHER): Payer: 59 | Admitting: Hematology and Oncology

## 2016-08-08 ENCOUNTER — Other Ambulatory Visit: Payer: 59

## 2016-08-08 ENCOUNTER — Other Ambulatory Visit (HOSPITAL_BASED_OUTPATIENT_CLINIC_OR_DEPARTMENT_OTHER): Payer: 59

## 2016-08-08 ENCOUNTER — Telehealth: Payer: Self-pay | Admitting: Hematology and Oncology

## 2016-08-08 VITALS — BP 98/65 | HR 66 | Temp 97.9°F | Resp 18

## 2016-08-08 DIAGNOSIS — C9001 Multiple myeloma in remission: Secondary | ICD-10-CM

## 2016-08-08 DIAGNOSIS — D61810 Antineoplastic chemotherapy induced pancytopenia: Secondary | ICD-10-CM | POA: Diagnosis not present

## 2016-08-08 DIAGNOSIS — Z5112 Encounter for antineoplastic immunotherapy: Secondary | ICD-10-CM | POA: Diagnosis not present

## 2016-08-08 DIAGNOSIS — G8929 Other chronic pain: Secondary | ICD-10-CM

## 2016-08-08 DIAGNOSIS — M549 Dorsalgia, unspecified: Secondary | ICD-10-CM | POA: Diagnosis not present

## 2016-08-08 DIAGNOSIS — T451X5A Adverse effect of antineoplastic and immunosuppressive drugs, initial encounter: Secondary | ICD-10-CM

## 2016-08-08 LAB — CBC WITH DIFFERENTIAL/PLATELET
BASO%: 4.4 % — AB (ref 0.0–2.0)
BASOS ABS: 0.2 10*3/uL — AB (ref 0.0–0.1)
EOS%: 12.9 % — AB (ref 0.0–7.0)
Eosinophils Absolute: 0.5 10*3/uL (ref 0.0–0.5)
HEMATOCRIT: 38.9 % (ref 34.8–46.6)
HEMOGLOBIN: 13 g/dL (ref 11.6–15.9)
LYMPH#: 1.3 10*3/uL (ref 0.9–3.3)
LYMPH%: 33.2 % (ref 14.0–49.7)
MCH: 33.5 pg (ref 25.1–34.0)
MCHC: 33.4 g/dL (ref 31.5–36.0)
MCV: 100.1 fL (ref 79.5–101.0)
MONO#: 0.9 10*3/uL (ref 0.1–0.9)
MONO%: 24.2 % — ABNORMAL HIGH (ref 0.0–14.0)
NEUT#: 1 10*3/uL — ABNORMAL LOW (ref 1.5–6.5)
NEUT%: 25.3 % — AB (ref 38.4–76.8)
PLATELETS: 118 10*3/uL — AB (ref 145–400)
RBC: 3.89 10*6/uL (ref 3.70–5.45)
RDW: 13.6 % (ref 11.2–14.5)
WBC: 3.8 10*3/uL — ABNORMAL LOW (ref 3.9–10.3)

## 2016-08-08 LAB — COMPREHENSIVE METABOLIC PANEL
ALBUMIN: 4.1 g/dL (ref 3.5–5.0)
ALK PHOS: 56 U/L (ref 40–150)
ALT: 14 U/L (ref 0–55)
ANION GAP: 10 meq/L (ref 3–11)
AST: 22 U/L (ref 5–34)
BUN: 13.9 mg/dL (ref 7.0–26.0)
CALCIUM: 9 mg/dL (ref 8.4–10.4)
CHLORIDE: 104 meq/L (ref 98–109)
CO2: 27 mEq/L (ref 22–29)
CREATININE: 0.8 mg/dL (ref 0.6–1.1)
EGFR: 86 mL/min/{1.73_m2} — ABNORMAL LOW (ref >90)
Glucose: 91 mg/dl (ref 70–140)
Potassium: 3.7 mEq/L (ref 3.5–5.1)
Sodium: 141 mEq/L (ref 136–145)
Total Bilirubin: 0.38 mg/dL (ref 0.20–1.20)
Total Protein: 5.9 g/dL — ABNORMAL LOW (ref 6.4–8.3)

## 2016-08-08 MED ORDER — SODIUM CHLORIDE 0.9% FLUSH
10.0000 mL | INTRAVENOUS | Status: DC | PRN
  Administered 2016-08-08: 10 mL
  Filled 2016-08-08: qty 10

## 2016-08-08 MED ORDER — PROCHLORPERAZINE MALEATE 10 MG PO TABS
10.0000 mg | ORAL_TABLET | Freq: Once | ORAL | Status: AC
  Administered 2016-08-08: 10 mg via ORAL

## 2016-08-08 MED ORDER — METHYLPREDNISOLONE SODIUM SUCC 125 MG IJ SOLR
INTRAMUSCULAR | Status: AC
  Filled 2016-08-08: qty 2

## 2016-08-08 MED ORDER — ACETAMINOPHEN 325 MG PO TABS
ORAL_TABLET | ORAL | Status: AC
  Filled 2016-08-08: qty 2

## 2016-08-08 MED ORDER — METHYLPREDNISOLONE SODIUM SUCC 125 MG IJ SOLR
125.0000 mg | Freq: Once | INTRAMUSCULAR | Status: AC
  Administered 2016-08-08: 125 mg via INTRAVENOUS

## 2016-08-08 MED ORDER — DIPHENHYDRAMINE HCL 25 MG PO CAPS
ORAL_CAPSULE | ORAL | Status: AC
  Filled 2016-08-08: qty 2

## 2016-08-08 MED ORDER — DIPHENHYDRAMINE HCL 25 MG PO CAPS
50.0000 mg | ORAL_CAPSULE | Freq: Once | ORAL | Status: AC
  Administered 2016-08-08: 50 mg via ORAL

## 2016-08-08 MED ORDER — SODIUM CHLORIDE 0.9 % IV SOLN
15.6000 mg/kg | Freq: Once | INTRAVENOUS | Status: AC
  Administered 2016-08-08: 900 mg via INTRAVENOUS
  Filled 2016-08-08: qty 40

## 2016-08-08 MED ORDER — SODIUM CHLORIDE 0.9 % IV SOLN
Freq: Once | INTRAVENOUS | Status: AC
  Administered 2016-08-08: 09:00:00 via INTRAVENOUS

## 2016-08-08 MED ORDER — ACETAMINOPHEN 325 MG PO TABS
650.0000 mg | ORAL_TABLET | Freq: Once | ORAL | Status: AC
  Administered 2016-08-08: 650 mg via ORAL

## 2016-08-08 MED ORDER — HEPARIN SOD (PORK) LOCK FLUSH 100 UNIT/ML IV SOLN
500.0000 [IU] | Freq: Once | INTRAVENOUS | Status: AC | PRN
  Administered 2016-08-08: 500 [IU]
  Filled 2016-08-08: qty 5

## 2016-08-08 MED ORDER — PROCHLORPERAZINE MALEATE 10 MG PO TABS
ORAL_TABLET | ORAL | Status: AC
  Filled 2016-08-08: qty 1

## 2016-08-08 NOTE — Telephone Encounter (Signed)
Scheduled appt per 6/1 LOS.

## 2016-08-08 NOTE — Assessment & Plan Note (Signed)
Her serum light chain continues to get worse Granted, we have recent dose adjustment for Pomalyst due to pancytopenia. However, I am still concerned that the current treatment is no longer working We reviewed the guidelines We also discussed about potential treatment including Kyprolis/Cytoxan/dexamethasone, Kyprolis/Pomalyst and dexamethasone, or Velcade/Pomalyst and dexamethasone. The Velcade was discontinued due to peripheral neuropathy but technically she did not progress on that I felt that at this point, she probably has progressed on Daratumumab. I think it is reasonable to try to continue Pomalyst for little longer We also discussed about referral to [facility] for second opinion For now, we will proceed with Daratumumab today  With a mild pancytopenia, I recommend holding off Pomalyst for 1 week and recheck blood work next week.  She will continue treatment and to June 17.  She has appointment to see [facility] physician on June 18 I will see her back after her appointment at [facility] for further discussion

## 2016-08-08 NOTE — Assessment & Plan Note (Signed)
This is due to Pomalyst We will hold treatment for 1 week She will continue Daratumumab today without delay

## 2016-08-08 NOTE — Patient Instructions (Signed)
[facility] Discharge Instructions for Patients Receiving Chemotherapy  Today you received the following chemotherapy agents Darzalex.  To help prevent nausea and vomiting after your treatment, we encourage you to take your nausea medication as directed.  If you develop nausea and vomiting that is not controlled by your nausea medication, call the clinic.   BELOW ARE SYMPTOMS THAT SHOULD BE REPORTED IMMEDIATELY:  *FEVER GREATER THAN 100.5 F  *CHILLS WITH OR WITHOUT FEVER  NAUSEA AND VOMITING THAT IS NOT CONTROLLED WITH YOUR NAUSEA MEDICATION  *UNUSUAL SHORTNESS OF BREATH  *UNUSUAL BRUISING OR BLEEDING  TENDERNESS IN MOUTH AND THROAT WITH OR WITHOUT PRESENCE OF ULCERS  *URINARY PROBLEMS  *BOWEL PROBLEMS  UNUSUAL RASH Items with * indicate a potential emergency and should be followed up as soon as possible.  Feel free to call the clinic you have any questions or concerns. The clinic phone number is [phone].  Please show the CHEMO ALERT CARD at check-in to the Emergency Department and triage nurse.    

## 2016-08-08 NOTE — Progress Notes (Signed)
[place] OFFICE PROGRESS NOTE  Patient Care Team: [name], MD as PCP - General [name], MD as Consulting Physician (Oncology) [name], MD as Consulting Physician (Neurosurgery) [name], MD as Consulting Physician (Hematology and Oncology) [name], [name], MD as Referring Physician (Hematology and Oncology)  SUMMARY OF ONCOLOGIC HISTORY:   Multiple myeloma in remission [facility])   07/21/2007 Bone Marrow Biopsy    Case #: [id number] Bone marrow showed myeloma      07/21/2007 Miscellaneous    She was diagnosed in May 2009. Had several cycles of RVD      10/14/2007 Bone Marrow Biopsy    Case #: [id number] repeat bone marrow biopsy showed good response to Rx      12/31/2007 Bone Marrow Transplant    She had autologous BMT at [facility]      07/19/2009 Bone Marrow Biopsy    Case #: [id number] Bone marrow biopsy showed only 1% plasma cells      10/10/2010 Bone Marrow Biopsy    [id number] Bone marrow biopsy showed 2% plasma cells      09/25/2011 Bone Marrow Biopsy    Accession #: [id number] Bone marrow only showed 4% plasma cells with ligh chain excess      10/14/2011 - 06/13/2013 Chemotherapy    She received Revlimid only, discontinued due to pancytopenia      06/18/2015 - 09/13/2015 Chemotherapy    She resumed taking Revlimid and dexamethasone. Treatment is stopped due to minimum response and pancytopenia      10/19/2015 -  Chemotherapy    The patient had Velcade for chemotherapy treatment along with weekly Daratumumab. Last dose of Velcade on 02/29/16, stopped due to progression. Pomalidomide added on 03/14/16      04/11/2016 Adverse Reaction    Delay resumption of Pomalyst cycle 2 until 04/16/16 due to neutropenia       INTERVAL HISTORY: Please see below for problem oriented charting. She returns for further follow-up She feels well Denies recent infection The patient denies any recent signs or symptoms of bleeding such as  spontaneous epistaxis, hematuria or hematochezia. Her chronic back pain is stable.  REVIEW OF SYSTEMS:   Constitutional: Denies fevers, chills or abnormal weight loss Eyes: Denies blurriness of vision Ears, nose, mouth, throat, and face: Denies mucositis or sore throat Respiratory: Denies cough, dyspnea or wheezes Cardiovascular: Denies palpitation, chest discomfort or lower extremity swelling Gastrointestinal:  Denies nausea, heartburn or change in bowel habits Skin: Denies abnormal skin rashes Lymphatics: Denies new lymphadenopathy or easy bruising Neurological:Denies numbness, tingling or new weaknesses Behavioral/Psych: Mood is stable, no new changes  All other systems were reviewed with the patient and are negative.  I have reviewed the past medical history, past surgical history, social history and family history with the patient and they are unchanged from previous note.  ALLERGIES:  is allergic to hydromorphone.  MEDICATIONS:  Current Outpatient Prescriptions  Medication Sig Dispense Refill  . acyclovir (ZOVIRAX) 400 MG tablet Take 1 tablet (400 mg total) by mouth 2 (two) times daily. 60 tablet 11  . aspirin 81 MG tablet Take 81 mg by mouth.    . cholecalciferol (VITAMIN D) 1000 UNITS tablet Take 1,000 Units by mouth daily.    . Multiple Vitamins-Minerals (ONE-A-DAY 50 PLUS PO) Take by mouth daily.    Marland Kitchen oxycodone (OXY-IR) 5 MG capsule Take 5 mg by mouth every 4 (four) hours as needed. pain    . Oxycodone HCl (OXYCONTIN) 60 MG TB12  Take 1 tablet by mouth 2 (two) times daily.    . pantoprazole (PROTONIX) 40 MG tablet Take 1 tablet (40 mg total) by mouth daily. 30 tablet 9  . pomalidomide (POMALYST) 2 MG capsule Take with water on days 1-21. Repeat every 28 days. 21 capsule 11  . pomalidomide (POMALYST) 2 MG capsule Take 1 capsule (2 mg total) by mouth daily. Take 1 capsule (2 mg) by mouth once daily for 21 days, and then off for 7 days. 21 capsule 0   No current  facility-administered medications for this visit.    Facility-Administered Medications Ordered in Other Visits  Medication Dose Route Frequency Provider Last Rate Last Dose  . sodium chloride flush (NS) 0.9 % injection 10 mL  10 mL Intracatheter PRN Alvy Bimler, Hiliary Osorto, MD   10 mL at 08/08/16 1516    PHYSICAL EXAMINATION: ECOG PERFORMANCE STATUS: 1 - Symptomatic but completely ambulatory GENERAL:alert, no distress and comfortable SKIN: skin color, texture, turgor are normal, no rashes or significant lesions EYES: normal, Conjunctiva are pink and non-injected, sclera clear OROPHARYNX:no exudate, no erythema and lips, buccal mucosa, and tongue normal  NECK: supple, thyroid normal size, non-tender, without nodularity LYMPH:  no palpable lymphadenopathy in the cervical, axillary or inguinal LUNGS: clear to auscultation and percussion with normal breathing effort HEART: regular rate & rhythm and no murmurs and no lower extremity edema ABDOMEN:abdomen soft, non-tender and normal bowel sounds Musculoskeletal:no cyanosis of digits and no clubbing  NEURO: alert & oriented x 3 with fluent speech, no focal motor/sensory deficits  LABORATORY DATA:  I have reviewed the data as listed    Component Value Date/Time   NA 141 08/08/2016 0842   K 3.7 08/08/2016 0842   CL 107 08/30/2012 1055   CO2 27 08/08/2016 0842   GLUCOSE 91 08/08/2016 0842   GLUCOSE 102 (H) 08/30/2012 1055   BUN 13.9 08/08/2016 0842   CREATININE 0.8 08/08/2016 0842   CALCIUM 9.0 08/08/2016 0842   PROT 5.9 (L) 08/08/2016 0842   ALBUMIN 4.1 08/08/2016 0842   AST 22 08/08/2016 0842   ALT 14 08/08/2016 0842   ALKPHOS 56 08/08/2016 0842   BILITOT 0.38 08/08/2016 0842    No results found for: SPEP, UPEP  Lab Results  Component Value Date   WBC 3.8 (L) 08/08/2016   NEUTROABS 1.0 (L) 08/08/2016   HGB 13.0 08/08/2016   HCT 38.9 08/08/2016   MCV 100.1 08/08/2016   PLT 118 (L) 08/08/2016      Chemistry      Component Value  Date/Time   NA 141 08/08/2016 0842   K 3.7 08/08/2016 0842   CL 107 08/30/2012 1055   CO2 27 08/08/2016 0842   BUN 13.9 08/08/2016 0842   CREATININE 0.8 08/08/2016 0842      Component Value Date/Time   CALCIUM 9.0 08/08/2016 0842   ALKPHOS 56 08/08/2016 0842   AST 22 08/08/2016 0842   ALT 14 08/08/2016 0842   BILITOT 0.38 08/08/2016 0842       ASSESSMENT & PLAN:  Multiple myeloma in remission ([place]) Her serum light chain continues to get worse Granted, we have recent dose adjustment for Pomalyst due to pancytopenia. However, I am still concerned that the current treatment is no longer working We reviewed the guidelines We also discussed about potential treatment including Kyprolis/Cytoxan/dexamethasone, Kyprolis/Pomalyst and dexamethasone, or Velcade/Pomalyst and dexamethasone. The Velcade was discontinued due to peripheral neuropathy but technically she did not progress on that I felt that at this point, she  probably has progressed on Daratumumab. I think it is reasonable to try to continue Pomalyst for little longer We also discussed about referral to [facility] for second opinion For now, we will proceed with Daratumumab today  With a mild pancytopenia, I recommend holding off Pomalyst for 1 week and recheck blood work next week.  She will continue treatment and to June 17.  She has appointment to see [facility] physician on June 18 I will see her back after her appointment at [facility] for further discussion  Pancytopenia due to antineoplastic chemotherapy [facility]) This is due to [facility] We will hold treatment for 1 week She will continue Daratumumab today without delay  Chronic back pain greater than 3 months duration Her back pain is drastically improved She will continue close follow-up at the pain clinic. She will continue calcium and vitamin D.   No orders of the defined types were placed in this encounter.  All questions were answered.  The patient knows to call the clinic with any problems, questions or concerns. No barriers to learning was detected. I spent 15 minutes counseling the patient face to face. The total time spent in the appointment was 20 minutes and more than 50% was on counseling and review of test results     [name], MD 08/08/2016 6:03 PM

## 2016-08-08 NOTE — Assessment & Plan Note (Signed)
Her back pain is drastically improved She will continue close follow-up at the pain clinic. She will continue calcium and vitamin D. 

## 2016-08-11 LAB — KAPPA/LAMBDA LIGHT CHAINS
Ig Kappa Free Light Chain: 115.2 mg/L — ABNORMAL HIGH (ref 3.3–19.4)
Ig Lambda Free Light Chain: 2.9 mg/L — ABNORMAL LOW (ref 5.7–26.3)
Kappa/Lambda FluidC Ratio: 39.72 — ABNORMAL HIGH (ref 0.26–1.65)

## 2016-08-12 LAB — MULTIPLE MYELOMA PANEL, SERUM
ALBUMIN/GLOB SERPL: 2.3 — AB (ref 0.7–1.7)
Albumin SerPl Elph-Mcnc: 3.8 g/dL (ref 2.9–4.4)
Alpha 1: 0.2 g/dL (ref 0.0–0.4)
Alpha2 Glob SerPl Elph-Mcnc: 0.6 g/dL (ref 0.4–1.0)
B-GLOBULIN SERPL ELPH-MCNC: 0.7 g/dL (ref 0.7–1.3)
GAMMA GLOB SERPL ELPH-MCNC: 0.2 g/dL — AB (ref 0.4–1.8)
GLOBULIN, TOTAL: 1.7 g/dL — AB (ref 2.2–3.9)
IGG (IMMUNOGLOBIN G), SERUM: 309 mg/dL — AB (ref 700–1600)
IGM (IMMUNOGLOBIN M), SRM: 11 mg/dL — AB (ref 26–217)
IgA, Qn, Serum: 10 mg/dL — ABNORMAL LOW (ref 87–352)
M PROTEIN SERPL ELPH-MCNC: 0.1 g/dL — AB
Total Protein: 5.5 g/dL — ABNORMAL LOW (ref 6.0–8.5)

## 2016-08-13 ENCOUNTER — Telehealth: Payer: Self-pay | Admitting: Hematology and Oncology

## 2016-08-13 NOTE — Telephone Encounter (Signed)
FAXED RECORDS TO [facility] RELEASE ID [id number]

## 2016-08-15 ENCOUNTER — Other Ambulatory Visit: Payer: Self-pay | Admitting: Hematology and Oncology

## 2016-08-15 ENCOUNTER — Other Ambulatory Visit (HOSPITAL_BASED_OUTPATIENT_CLINIC_OR_DEPARTMENT_OTHER): Payer: 59

## 2016-08-15 DIAGNOSIS — C9001 Multiple myeloma in remission: Secondary | ICD-10-CM

## 2016-08-15 LAB — COMPREHENSIVE METABOLIC PANEL
ALT: 14 U/L (ref 0–55)
ANION GAP: 8 meq/L (ref 3–11)
AST: 21 U/L (ref 5–34)
Albumin: 3.8 g/dL (ref 3.5–5.0)
Alkaline Phosphatase: 49 U/L (ref 40–150)
BILIRUBIN TOTAL: 0.42 mg/dL (ref 0.20–1.20)
BUN: 15.4 mg/dL (ref 7.0–26.0)
CALCIUM: 8.9 mg/dL (ref 8.4–10.4)
CO2: 28 meq/L (ref 22–29)
CREATININE: 0.8 mg/dL (ref 0.6–1.1)
Chloride: 106 mEq/L (ref 98–109)
EGFR: 81 mL/min/{1.73_m2} — ABNORMAL LOW (ref >90)
Glucose: 87 mg/dl (ref 70–140)
Potassium: 3.9 mEq/L (ref 3.5–5.1)
Sodium: 142 mEq/L (ref 136–145)
TOTAL PROTEIN: 5.8 g/dL — AB (ref 6.4–8.3)

## 2016-08-15 LAB — CBC WITH DIFFERENTIAL/PLATELET
BASO%: 3.9 % — AB (ref 0.0–2.0)
Basophils Absolute: 0.2 10*3/uL — ABNORMAL HIGH (ref 0.0–0.1)
EOS%: 3.4 % (ref 0.0–7.0)
Eosinophils Absolute: 0.1 10*3/uL (ref 0.0–0.5)
HCT: 39.4 % (ref 34.8–46.6)
HGB: 13 g/dL (ref 11.6–15.9)
LYMPH%: 29.5 % (ref 14.0–49.7)
MCH: 33.4 pg (ref 25.1–34.0)
MCHC: 33 g/dL (ref 31.5–36.0)
MCV: 101.3 fL — ABNORMAL HIGH (ref 79.5–101.0)
MONO#: 0.3 10*3/uL (ref 0.1–0.9)
MONO%: 7.3 % (ref 0.0–14.0)
NEUT#: 2.3 10*3/uL (ref 1.5–6.5)
NEUT%: 55.9 % (ref 38.4–76.8)
Platelets: 139 10*3/uL — ABNORMAL LOW (ref 145–400)
RBC: 3.89 10*6/uL (ref 3.70–5.45)
RDW: 13.3 % (ref 11.2–14.5)
WBC: 4.1 10*3/uL (ref 3.9–10.3)
lymph#: 1.2 10*3/uL (ref 0.9–3.3)

## 2016-08-18 ENCOUNTER — Other Ambulatory Visit: Payer: Self-pay

## 2016-08-18 DIAGNOSIS — C9001 Multiple myeloma in remission: Secondary | ICD-10-CM

## 2016-08-18 MED ORDER — POMALIDOMIDE 2 MG PO CAPS: 2.0000 mg | ORAL_CAPSULE | Freq: Every day | ORAL | 0 refills | Status: AC

## 2016-08-19 ENCOUNTER — Telehealth: Payer: Self-pay

## 2016-08-19 NOTE — Telephone Encounter (Signed)
Called with below message. 

## 2016-08-19 NOTE — Telephone Encounter (Signed)
Husband [name] called and ask if his wife should continue Pomalyst. It has been on hold until lab recheck.

## 2016-08-19 NOTE — Telephone Encounter (Signed)
Labs last week is good, OK to resume Pomalyst but stop the day before her appt at [facility]

## 2016-08-26 ENCOUNTER — Ambulatory Visit (HOSPITAL_BASED_OUTPATIENT_CLINIC_OR_DEPARTMENT_OTHER): Payer: 59 | Admitting: Hematology and Oncology

## 2016-08-26 ENCOUNTER — Ambulatory Visit: Payer: 59 | Admitting: Hematology and Oncology

## 2016-08-26 VITALS — BP 124/78 | HR 82 | Temp 98.5°F | Resp 18 | Ht 59.0 in | Wt 130.6 lb

## 2016-08-26 DIAGNOSIS — C9002 Multiple myeloma in relapse: Secondary | ICD-10-CM | POA: Diagnosis not present

## 2016-08-26 DIAGNOSIS — Z7189 Other specified counseling: Secondary | ICD-10-CM

## 2016-08-26 MED ORDER — DEXAMETHASONE 4 MG PO TABS: ORAL_TABLET | ORAL | 1 refills | Status: DC

## 2016-08-26 NOTE — Progress Notes (Signed)
START ON PATHWAY REGIMEN - Multiple Myeloma     A cycle is every 28 days:     Daratumumab      Pomalidomide      Dexamethasone      Dexamethasone   **Always confirm dose/schedule in your pharmacy ordering system**    Patient Characteristics: Relapsed / Refractory, All Lines of Therapy R-ISS Staging: III Disease Classification: Relapsed Line of Therapy: Third Line Intent of Therapy: Non-Curative / Palliative Intent, Discussed with Patient 

## 2016-08-27 ENCOUNTER — Encounter: Payer: Self-pay | Admitting: Hematology and Oncology

## 2016-08-27 ENCOUNTER — Telehealth: Payer: Self-pay | Admitting: *Deleted

## 2016-08-27 DIAGNOSIS — Z7189 Other specified counseling: Secondary | ICD-10-CM | POA: Insufficient documentation

## 2016-08-27 NOTE — Assessment & Plan Note (Signed)
She returns with her husband to review recommendations from [facility] Formal consult not is not yet dictated It was felt that the patient should be best treated with full workup including bone marrow aspirate and biopsy, PET CT scan with repeat staging She has demonstrated partial response to treatment with Daratumumab and Pomalyst but it was felt that she was inadequately treated with reduced dosing Per recommendation from [facility], she will be restarted back on full dose Daratumumab weekly for the first 2 months along with Pomalyst daily, 21 days on, 7 days off along with pulse dexamethasone once a week I have put multiple orders with tentative plan for her to resume treatment on September 05, 2016 I recommend she gets Pomalyst prescription ready to start on that date as well We will start her on pulse dexamethasone 20 mg once a week We will monitor her myeloma panel once a month She will continue prophylactic treatment with acyclovir as form of antimicrobial prophylaxis, calcium with vitamin D and Zometa every 3 months and aspirin 81 mg daily for DVT prophylaxis The risks, benefits, side effects of this approach are discussed with the patient and her husband and they agreed to proceed.

## 2016-08-27 NOTE — Telephone Encounter (Signed)
Notified that PET and BMBX are scheduled in correct order. PET will not effect BMBX

## 2016-08-27 NOTE — Progress Notes (Signed)
[place] OFFICE PROGRESS NOTE  Patient Care Team: [name], MD as PCP - General [name], MD as Consulting Physician (Oncology) [name], MD as Consulting Physician (Neurosurgery) [name], MD as Consulting Physician (Hematology and Oncology) [name], [name], MD as Referring Physician (Hematology and Oncology)  SUMMARY OF ONCOLOGIC HISTORY:   Multiple myeloma in relapse [facility])   07/21/2007 Bone Marrow Biopsy    Case #: [id number] Bone marrow showed myeloma      07/21/2007 Miscellaneous    She was diagnosed in May 2009. Had several cycles of RVD      10/14/2007 Bone Marrow Biopsy    Case #: [id number] repeat bone marrow biopsy showed good response to Rx      12/31/2007 Bone Marrow Transplant    She had autologous BMT at [facility]      07/19/2009 Bone Marrow Biopsy    Case #: [id number] Bone marrow biopsy showed only 1% plasma cells      10/10/2010 Bone Marrow Biopsy    [id number] Bone marrow biopsy showed 2% plasma cells      09/25/2011 Bone Marrow Biopsy    Accession #: [id number] Bone marrow only showed 4% plasma cells with ligh chain excess      10/14/2011 - 06/13/2013 Chemotherapy    She received Revlimid only, discontinued due to pancytopenia      06/18/2015 - 09/13/2015 Chemotherapy    She resumed taking Revlimid and dexamethasone. Treatment is stopped due to minimum response and pancytopenia      10/19/2015 -  Chemotherapy    The patient had Velcade for chemotherapy treatment along with weekly Daratumumab. Last dose of Velcade on 02/29/16, stopped due to progression. Pomalidomide added on 03/14/16      04/11/2016 Adverse Reaction    Delay resumption of Pomalyst cycle 2 until 04/16/16 due to neutropenia       INTERVAL HISTORY: Please see below for problem oriented charting. She returns with her husband for further follow-up She denies recent infection No new bone pain  REVIEW OF SYSTEMS:   Constitutional: Denies  fevers, chills or abnormal weight loss Eyes: Denies blurriness of vision Ears, nose, mouth, throat, and face: Denies mucositis or sore throat Respiratory: Denies cough, dyspnea or wheezes Cardiovascular: Denies palpitation, chest discomfort or lower extremity swelling Gastrointestinal:  Denies nausea, heartburn or change in bowel habits Skin: Denies abnormal skin rashes Lymphatics: Denies new lymphadenopathy or easy bruising Neurological:Denies numbness, tingling or new weaknesses Behavioral/Psych: Mood is stable, no new changes  All other systems were reviewed with the patient and are negative.  I have reviewed the past medical history, past surgical history, social history and family history with the patient and they are unchanged from previous note.  ALLERGIES:  is allergic to hydromorphone.  MEDICATIONS:  Current Outpatient Prescriptions  Medication Sig Dispense Refill  . acyclovir (ZOVIRAX) 400 MG tablet Take 1 tablet (400 mg total) by mouth 2 (two) times daily. 60 tablet 11  . aspirin 81 MG tablet Take 81 mg by mouth.    . cholecalciferol (VITAMIN D) 1000 UNITS tablet Take 1,000 Units by mouth daily.    Marland Kitchen dexamethasone (DECADRON) 4 MG tablet Take 5 tabs once a week with breakfast every Tuesdays 90 tablet 1  . Multiple Vitamins-Minerals (ONE-A-DAY 50 PLUS PO) Take by mouth daily.    Marland Kitchen oxycodone (OXY-IR) 5 MG capsule Take 5 mg by mouth every 4 (four) hours as needed. pain    . Oxycodone HCl (OXYCONTIN) 60  MG TB12 Take 1 tablet by mouth 2 (two) times daily.    . pantoprazole (PROTONIX) 40 MG tablet Take 1 tablet (40 mg total) by mouth daily. 30 tablet 9  . pomalidomide (POMALYST) 2 MG capsule Take 1 capsule (2 mg total) by mouth daily. Take with water on days 1-21. Repeat every 28 days. 21 capsule 0   No current facility-administered medications for this visit.     PHYSICAL EXAMINATION: ECOG PERFORMANCE STATUS: 0 - Asymptomatic  Vitals:   08/26/16 1057  BP: 124/78  Pulse: 82   Resp: 18  Temp: 98.5 F (36.9 C)   Filed Weights   08/26/16 1057  Weight: 130 lb 9.6 oz (59.2 kg)    GENERAL:alert, no distress and comfortable SKIN: skin color, texture, turgor are normal, no rashes or significant lesions EYES: normal, Conjunctiva are pink and non-injected, sclera clear OROPHARYNX:no exudate, no erythema and lips, buccal mucosa, and tongue normal  NECK: supple, thyroid normal size, non-tender, without nodularity LYMPH:  no palpable lymphadenopathy in the cervical, axillary or inguinal LUNGS: clear to auscultation and percussion with normal breathing effort HEART: regular rate & rhythm and no murmurs and no lower extremity edema ABDOMEN:abdomen soft, non-tender and normal bowel sounds Musculoskeletal:no cyanosis of digits and no clubbing  NEURO: alert & oriented x 3 with fluent speech, no focal motor/sensory deficits  LABORATORY DATA:  I have reviewed the data as listed    Component Value Date/Time   NA 142 08/15/2016 1110   K 3.9 08/15/2016 1110   CL 107 08/30/2012 1055   CO2 28 08/15/2016 1110   GLUCOSE 87 08/15/2016 1110   GLUCOSE 102 (H) 08/30/2012 1055   BUN 15.4 08/15/2016 1110   CREATININE 0.8 08/15/2016 1110   CALCIUM 8.9 08/15/2016 1110   PROT 5.8 (L) 08/15/2016 1110   ALBUMIN 3.8 08/15/2016 1110   AST 21 08/15/2016 1110   ALT 14 08/15/2016 1110   ALKPHOS 49 08/15/2016 1110   BILITOT 0.42 08/15/2016 1110    No results found for: SPEP, UPEP  Lab Results  Component Value Date   WBC 4.1 08/15/2016   NEUTROABS 2.3 08/15/2016   HGB 13.0 08/15/2016   HCT 39.4 08/15/2016   MCV 101.3 (H) 08/15/2016   PLT 139 (L) 08/15/2016      Chemistry      Component Value Date/Time   NA 142 08/15/2016 1110   K 3.9 08/15/2016 1110   CL 107 08/30/2012 1055   CO2 28 08/15/2016 1110   BUN 15.4 08/15/2016 1110   CREATININE 0.8 08/15/2016 1110      Component Value Date/Time   CALCIUM 8.9 08/15/2016 1110   ALKPHOS 49 08/15/2016 1110   AST 21  08/15/2016 1110   ALT 14 08/15/2016 1110   BILITOT 0.42 08/15/2016 1110       ASSESSMENT & PLAN:  Multiple myeloma in relapse [facility]) She returns with her husband to review recommendations from [facility] Formal consult not is not yet dictated It was felt that the patient should be best treated with full workup including bone marrow aspirate and biopsy, PET CT scan with repeat staging She has demonstrated partial response to treatment with Daratumumab and Pomalyst but it was felt that she was inadequately treated with reduced dosing Per recommendation from [facility], she will be restarted back on full dose Daratumumab weekly for the first 2 months along with Pomalyst daily, 21 days on, 7 days off along with pulse dexamethasone once a week I have put multiple orders  with tentative plan for her to resume treatment on September 05, 2016 I recommend she gets Pomalyst prescription ready to start on that date as well We will start her on pulse dexamethasone 20 mg once a week We will monitor her myeloma panel once a month She will continue prophylactic treatment with acyclovir as form of antimicrobial prophylaxis, calcium with vitamin D and Zometa every 3 months and aspirin 81 mg daily for DVT prophylaxis The risks, benefits, side effects of this approach are discussed with the patient and her husband and they agreed to proceed.   Goals of care, counseling/discussion The patient is aware she has incurable disease and treatment is strictly palliative. We discussed importance of Advanced Directives and Living will.   Orders Placed This Encounter  Procedures  . CT BIOPSY    Standing Status:   Future    Standing Expiration Date:   09/30/2017    Order Specific Question:   Reason for Exam (SYMPTOM  OR DIAGNOSIS REQUIRED)    Answer:   bone marrow biopsy    Order Specific Question:   Preferred imaging location?    Answer:   [facility]    Order Specific Question:   Is the patient pregnant?     Answer:   No  . CT BONE MARROW BIOPSY & ASPIRATION    Standing Status:   Future    Standing Expiration Date:   11/26/2017    Order Specific Question:   Reason for Exam (SYMPTOM  OR DIAGNOSIS REQUIRED)    Answer:   bone marrow biopsy    Order Specific Question:   Is patient pregnant?    Answer:   No    Order Specific Question:   Preferred imaging location?    Answer:   [facility]    Order Specific Question:   Radiology Contrast Protocol - do NOT remove file path    Answer:   [email or web address]  . NM PET Image Restage (PS) Whole Body    Standing Status:   Future    Standing Expiration Date:   10/26/2017    Order Specific Question:   Reason for Exam (SYMPTOM  OR DIAGNOSIS REQUIRED)    Answer:   myeloma    Order Specific Question:   Is the patient pregnant?    Answer:   No    Order Specific Question:   Preferred imaging location?    Answer:   [facility]   All questions were answered. The patient knows to call the clinic with any problems, questions or concerns. No barriers to learning was detected. I spent 30 minutes counseling the patient face to face. The total time spent in the appointment was 40 minutes and more than 50% was on counseling and review of test results     [name], MD 08/27/2016 8:47 AM

## 2016-08-27 NOTE — Assessment & Plan Note (Signed)
The patient is aware she has incurable disease and treatment is strictly palliative. We discussed importance of Advanced Directives and Living will. 

## 2016-08-27 NOTE — Telephone Encounter (Signed)
Jessica Cochran called to say she is scheduled for PET on 7/10 and BMBX on 7/11. Wants to make sure it is OK to have BMBX the day after PET, concerned about the dye being in her system.

## 2016-08-28 ENCOUNTER — Telehealth: Payer: Self-pay | Admitting: Hematology and Oncology

## 2016-08-28 NOTE — Telephone Encounter (Signed)
Scheduled appt per 6/19 los - patient is aware and will get a new schedule when she comes in on 6/29

## 2016-08-29 ENCOUNTER — Encounter: Payer: Self-pay | Admitting: Hematology and Oncology

## 2016-08-29 NOTE — Progress Notes (Signed)
Reviewed patient's billing and noticed a copay for a drug(Darzalex) that has possible copay assistance with her diagnosis. Confirmed on needymeds that [employer] has assistance available for $8400 per year.   Called patient to introduce myself as her Arboriculturist and provided her with the information. Patient thanked me and said that her husband handles all of that and pays the bills so she would share the information with him. Provided her the contact information for the [facility] as well as for myself.

## 2016-09-01 ENCOUNTER — Telehealth: Payer: Self-pay

## 2016-09-01 NOTE — Telephone Encounter (Signed)
Pt has been in conversation with Tammi about timing of PET and BMBX, call transferred.

## 2016-09-02 ENCOUNTER — Other Ambulatory Visit: Payer: Self-pay | Admitting: Hematology and Oncology

## 2016-09-02 ENCOUNTER — Telehealth: Payer: Self-pay

## 2016-09-02 NOTE — Telephone Encounter (Signed)
Called patient per Dr. Alvy Bimler. New treatment will be started 7-13, instead of this Friday. PET scan and Bone marrow has to be done prior to new treatment starting. Patient to restart Pomalyst today and stop one week prior to starting new treatment on 7-13. Start Decadron 2 days prior to new treatment on 7-13. Patient verbalized understanding, instructed to call for questions.

## 2016-09-05 ENCOUNTER — Other Ambulatory Visit: Payer: 59

## 2016-09-05 ENCOUNTER — Ambulatory Visit: Payer: 59

## 2016-09-09 ENCOUNTER — Ambulatory Visit (HOSPITAL_COMMUNITY): Payer: 59

## 2016-09-12 ENCOUNTER — Other Ambulatory Visit: Payer: Self-pay | Admitting: *Deleted

## 2016-09-12 ENCOUNTER — Ambulatory Visit: Payer: 59 | Admitting: Hematology and Oncology

## 2016-09-12 ENCOUNTER — Ambulatory Visit: Payer: 59

## 2016-09-12 ENCOUNTER — Other Ambulatory Visit: Payer: 59

## 2016-09-13 ENCOUNTER — Other Ambulatory Visit: Payer: Self-pay | Admitting: *Deleted

## 2016-09-13 MED ORDER — POMALYST 2 MG PO CAPS: ORAL_CAPSULE | ORAL | 10 refills | Status: DC

## 2016-09-16 ENCOUNTER — Other Ambulatory Visit: Payer: Self-pay | Admitting: Radiology

## 2016-09-16 ENCOUNTER — Ambulatory Visit (HOSPITAL_COMMUNITY)
Admission: RE | Admit: 2016-09-16 | Discharge: 2016-09-16 | Disposition: A | Discharge status: Home / Self Care | Payer: 59 | Source: Ambulatory Visit | Attending: Hematology and Oncology | Admitting: Hematology and Oncology

## 2016-09-16 ENCOUNTER — Other Ambulatory Visit: Payer: Self-pay | Admitting: Physician Assistant

## 2016-09-16 DIAGNOSIS — J0100 Acute maxillary sinusitis, unspecified: Secondary | ICD-10-CM | POA: Insufficient documentation

## 2016-09-16 DIAGNOSIS — M4855XA Collapsed vertebra, not elsewhere classified, thoracolumbar region, initial encounter for fracture: Secondary | ICD-10-CM | POA: Insufficient documentation

## 2016-09-16 DIAGNOSIS — C9002 Multiple myeloma in relapse: Secondary | ICD-10-CM | POA: Diagnosis present

## 2016-09-16 LAB — GLUCOSE, CAPILLARY: Glucose-Capillary: 103 mg/dL — ABNORMAL HIGH (ref 65–99)

## 2016-09-16 MED ORDER — FLUDEOXYGLUCOSE F - 18 (FDG) INJECTION
6.4200 | Freq: Once | INTRAVENOUS | Status: AC
  Administered 2016-09-16: 6.42 via INTRAVENOUS

## 2016-09-17 ENCOUNTER — Ambulatory Visit (HOSPITAL_COMMUNITY)
Admission: RE | Admit: 2016-09-17 | Discharge: 2016-09-17 | Disposition: A | Discharge status: Home / Self Care | Payer: 59 | Source: Ambulatory Visit | Attending: Hematology and Oncology | Admitting: Hematology and Oncology

## 2016-09-17 ENCOUNTER — Encounter (HOSPITAL_COMMUNITY): Payer: Self-pay

## 2016-09-17 DIAGNOSIS — Z79899 Other long term (current) drug therapy: Secondary | ICD-10-CM | POA: Insufficient documentation

## 2016-09-17 DIAGNOSIS — C9002 Multiple myeloma in relapse: Secondary | ICD-10-CM

## 2016-09-17 DIAGNOSIS — D696 Thrombocytopenia, unspecified: Secondary | ICD-10-CM | POA: Insufficient documentation

## 2016-09-17 DIAGNOSIS — Z885 Allergy status to narcotic agent status: Secondary | ICD-10-CM | POA: Diagnosis not present

## 2016-09-17 DIAGNOSIS — Z7982 Long term (current) use of aspirin: Secondary | ICD-10-CM | POA: Diagnosis not present

## 2016-09-17 DIAGNOSIS — D72810 Lymphocytopenia: Secondary | ICD-10-CM | POA: Insufficient documentation

## 2016-09-17 DIAGNOSIS — C9000 Multiple myeloma not having achieved remission: Secondary | ICD-10-CM | POA: Diagnosis present

## 2016-09-17 LAB — CBC
HEMATOCRIT: 44.1 % (ref 36.0–46.0)
Hemoglobin: 15.6 g/dL — ABNORMAL HIGH (ref 12.0–15.0)
MCH: 34.3 pg — ABNORMAL HIGH (ref 26.0–34.0)
MCHC: 35.4 g/dL (ref 30.0–36.0)
MCV: 96.9 fL (ref 78.0–100.0)
PLATELETS: 116 10*3/uL — AB (ref 150–400)
RBC: 4.55 MIL/uL (ref 3.87–5.11)
RDW: 12.8 % (ref 11.5–15.5)
WBC: 5.5 10*3/uL (ref 4.0–10.5)

## 2016-09-17 MED ORDER — MIDAZOLAM HCL 2 MG/2ML IJ SOLN
INTRAMUSCULAR | Status: AC | PRN
  Administered 2016-09-17 (×2): 1 mg via INTRAVENOUS

## 2016-09-17 MED ORDER — MIDAZOLAM HCL 2 MG/2ML IJ SOLN
INTRAMUSCULAR | Status: AC
  Filled 2016-09-17: qty 6

## 2016-09-17 MED ORDER — FENTANYL CITRATE (PF) 100 MCG/2ML IJ SOLN
INTRAMUSCULAR | Status: AC | PRN
  Administered 2016-09-17: 50 ug via INTRAVENOUS
  Administered 2016-09-17 (×2): 25 ug via INTRAVENOUS

## 2016-09-17 MED ORDER — FENTANYL CITRATE (PF) 100 MCG/2ML IJ SOLN
INTRAMUSCULAR | Status: AC
  Filled 2016-09-17: qty 2

## 2016-09-17 MED ORDER — LIDOCAINE HCL 1 % IJ SOLN
INTRAMUSCULAR | Status: AC | PRN
  Administered 2016-09-17: 10 mL

## 2016-09-17 MED ORDER — SODIUM CHLORIDE 0.9 % IV SOLN
INTRAVENOUS | Status: DC
  Administered 2016-09-17: 10:00:00 via INTRAVENOUS

## 2016-09-17 NOTE — Sedation Documentation (Signed)
Patient is resting comfortably. 

## 2016-09-17 NOTE — H&P (Signed)
Referring Physician(s): [name]  Supervising Physician: Aletta Edouard  Patient Status:  [facility]  Chief Complaint:  "I'm here for a bone marrow biopsy"  Subjective: Patient familiar to IR service from prior Port-A-Cath placement in 2009. She has a history of relapsing multiple myeloma with partial response to treatment. She presents today for CT-guided bone marrow biopsy for further evaluation. She currently denies fever, headache, chest pain, dyspnea, cough, abdominal/back pain, nausea, vomiting or abnormal bleeding. Past Medical History:  Diagnosis Date  . Arthritis   . Cough 06/09/2014  . Dysuria 06/09/2014  . Herpes zoster infection 04/29/2011  . Multiple myeloma in relapse ([place]) 10/12/2011   08/19/11: increase in kappa serum free light chains; 7/11: elevation urine protein (157 mg/24 hr) and reappearance monoclonal kappa light chains; 7/18: bone marrow biopsy 4% plasma cells; bone X-rays: no new lesions  . Multiple myeloma in remission ([place]) 04/29/2011  . Pelvic mass in female 08/28/2014  . Renal stone   . Yeast infection 06/08/2015   Past Surgical History:  Procedure Laterality Date  . BACK SURGERY     2009  . PILONIDAL CYST DRAINAGE  1991  . rectal abcess    . stem cell transplant autologus    . uterine fibroid tumor removed     1990's      Allergies: Hydromorphone  Medications: Prior to Admission medications   Medication Sig Start Date End Date Taking? Authorizing Provider  acyclovir (ZOVIRAX) 400 MG tablet Take 1 tablet (400 mg total) by mouth 2 (two) times daily. 06/06/16  Yes Gorsuch, Ernst Spell, MD  aspirin 81 MG tablet Take 81 mg by mouth. 01/13/12  Yes [provider]  dexamethasone (DECADRON) 4 MG tablet Take 5 tabs once a week with breakfast every Tuesdays 08/26/16  Yes Gorsuch, Ni, MD  Multiple Vitamins-Minerals (ONE-A-DAY 50 PLUS PO) Take by mouth daily.   Yes [provider]  oxycodone (OXY-IR) 5 MG capsule Take 5 mg by mouth every 4 (four)  hours as needed. pain   Yes [provider]  Oxycodone HCl (OXYCONTIN) 60 MG TB12 Take 1 tablet by mouth 2 (two) times daily.   Yes [provider]  pantoprazole (PROTONIX) 40 MG tablet Take 1 tablet (40 mg total) by mouth daily. 04/11/16  Yes Gorsuch, Ni, MD  cholecalciferol (VITAMIN D) 1000 UNITS tablet Take 1,000 Units by mouth daily.    [provider]  POMALYST 2 MG capsule Take 1 tablet (29m) by mouth daily. Take on days 1-21. Repeat every 28 days 09/13/16   GHeath Lark MD     Vital Signs: BP 119/86   Pulse 82   Temp 97.8 F (36.6 C) (Oral)   Resp 17   Ht 4' 11"  (1.499 m)   Wt 132 lb (59.9 kg)   SpO2 100%   BMI 26.66 kg/m   Physical Exam awake, alert. Chest clear to auscultation bilaterally.Clean, intact right chest wall Port-A-Cath. Heart with regular rate and rhythm. Abdomen soft, positive bowel sounds, nontender. No lower extremity edema.  Imaging: Nm Pet Image Restage (ps) Whole Body  Addendum Date: 09/16/2016   ADDENDUM REPORT: 09/16/2016 15:44 ADDENDUM: The original report was by Dr. WVan Clines The following addendum is by Dr. WVan Clines [place] high density in the uterus could represent a fibroid or endometrial polyp, and appears similar to prior exams. This lesion is not hypermetabolic and accordingly I am skeptical of endometrial cancer. Electronically Signed   By: WVan ClinesM.D.   On: 09/16/2016 15:44  Result Date: 09/16/2016 CLINICAL DATA:  Subsequent treatment strategy for multiple myeloma. EXAM: NUCLEAR MEDICINE PET WHOLE BODY TECHNIQUE: 6.4 mCi F-18 FDG was injected intravenously. Full-ring PET imaging was performed from the vertex to the feet after the radiotracer. CT data was obtained and used for attenuation correction and anatomic localization. FASTING BLOOD GLUCOSE:  Value: 103 mg/dl COMPARISON:  06/05/2015 FINDINGS: HEAD/NECK No hypermetabolic activity in the scalp. No hypermetabolic cervical lymph nodes. Air fluid  level in the right maxillary sinus suggesting acute sinusitis. Glottic activity is likely physiologic. CHEST No hypermetabolic mediastinal or hilar nodes. No suspicious pulmonary nodules on the CT scan. Scarring or atelectasis in the right middle lobe and lingula. ABDOMEN/PELVIS 1.5 cm hypodense lesion posteriorly in the right hepatic lobe, not appreciably changed from prior, photopenic. Likely a cyst. Mildly distended gallbladder.  Pancreas and spleen unremarkable. Prominent stool throughout the colon favors constipation. Hypermetabolic activity along the sigmoid colon with some local inflammatory findings in the left lower quadrant, maximum SUV in this vicinity 11.0. This was not present on the prior exam and accordingly I am skeptical of colon cancer, although colon malignancy could cause a similar appearance otherwise. SKELETON The previously sternal hypermetabolic activity has resolved. No current accentuated abnormal osseous activity to suggest active bony myeloma. Thoracolumbar compression fractures most notable at L1 and L2, there is vertebral augmentation at L1. EXTREMITIES No abnormal hypermetabolic activity in the lower extremities. IMPRESSION: 1. No findings of active bony lymphoma. Prior activity in the sternum and thoracic spine has resolved. 2. Focal hypermetabolic activity along the sigmoid colon with some local inflammatory stranding in the adjacent mesentery, maximum SUV 11.0. This is probably from mild acute diverticulitis. There is no hypermetabolic activity in this vicinity 4 months ago to suggest that this is from colon cancer. Correlate with any symptoms. 3. Acute right maxillary sinusitis. 4. Thoracolumbar compression fractures, vertebral augmentation at the L1 level. Electronically Signed: By: Van Clines M.D. On: 09/16/2016 13:57    Labs:  CBC:  Recent Labs  07/11/16 0832 07/23/16 1121 08/08/16 0842 08/15/16 1110  WBC 3.8* 3.7* 3.8* 4.1  HGB 12.7 13.0 13.0 13.0  HCT  38.4 38.9 38.9 39.4  PLT 111* 150 118* 139*    COAGS: No results for input(s): INR, APTT in the last 8760 hours.  BMP:  Recent Labs  07/11/16 0832 07/23/16 1121 08/08/16 0842 08/15/16 1110  NA 143 141 141 142  K 4.0 4.0 3.7 3.9  CO2 27 27 27 28   GLUCOSE 92 86 91 87  BUN 17.0 9.7 13.9 15.4  CALCIUM 9.2 8.7 9.0 8.9  CREATININE 0.8 0.8 0.8 0.8    LIVER FUNCTION TESTS:  Recent Labs  07/11/16 0832 07/23/16 1121 08/08/16 0842 08/15/16 1110  BILITOT 0.54 0.46 0.38 0.42  AST 22 22 22 21   ALT 17 17 14 14   ALKPHOS 53 54 56 49  PROT 6.0*  5.9* 5.7* 5.9*  5.5* 5.8*  ALBUMIN 4.1 3.9 4.1 3.8    Assessment and Plan: Patient with history of relapsing multiple myeloma; presents today for CT-guided bone marrow biopsy for further evaluation.Risks and benefits discussed with the patient/husband including, but not limited to bleeding, infection, damage to adjacent structures or low yield requiring additional tests. All of the patient's questions were answered, patient is agreeable to proceed. Consent signed and in chart.     Electronically Signed: D. Rowe Robert, PA-C 09/17/2016, 9:42 AM   I spent a total of 20 minutes at the the patient's bedside AND on the  patient's hospital floor or unit, greater than 50% of which was counseling/coordinating care for CT-guided bone marrow biopsy

## 2016-09-17 NOTE — Sedation Documentation (Signed)
Patient denies pain and is resting comfortably.  

## 2016-09-17 NOTE — Discharge Instructions (Addendum)
Moderate Conscious Sedation, Adult, Care After These instructions provide you with information about caring for yourself after your procedure. Your health care provider may also give you more specific instructions. Your treatment has been planned according to current medical practices, but problems sometimes occur. Call your health care provider if you have any problems or questions after your procedure. What can I expect after the procedure? After your procedure, it is common:  To feel sleepy for several hours.  To feel clumsy and have poor balance for several hours.  To have poor judgment for several hours.  To vomit if you eat too soon.  Follow these instructions at home: For at least 24 hours after the procedure:   Do not: ? Participate in activities where you could fall or become injured. ? Drive. ? Use heavy machinery. ? Drink alcohol. ? Take sleeping pills or medicines that cause drowsiness. ? Make important decisions or sign legal documents. ? Take care of children on your own.  Rest. Eating and drinking  Follow the diet recommended by your health care provider.  If you vomit: ? Drink water, juice, or soup when you can drink without vomiting. ? Make sure you have little or no nausea before eating solid foods. General instructions  Have a responsible adult stay with you until you are awake and alert.  Take over-the-counter and prescription medicines only as told by your health care provider.  If you smoke, do not smoke without supervision.  Keep all follow-up visits as told by your health care provider. This is important. Contact a health care provider if:  You keep feeling nauseous or you keep vomiting.  You feel light-headed.  You develop a rash.  You have a fever. Get help right away if:  You have trouble breathing. This information is not intended to replace advice given to you by your health care provider. Make sure you discuss any questions you have  with your health care provider. Document Released: 12/15/2012 Document Revised: 07/30/2015 Document Reviewed: 06/16/2015 Elsevier Interactive Patient Education  2018 [place]. Bone Marrow Aspiration and Bone Marrow Biopsy, Adult, Care After This sheet gives you information about how to care for yourself after your procedure. Your health care provider may also give you more specific instructions. If you have problems or questions, contact your health care provider. What can I expect after the procedure? After the procedure, it is common to have:  Mild pain and tenderness.  Swelling.  Bruising.  Follow these instructions at home:  Take over-the-counter or prescription medicines only as told by your health care provider.  Do not take baths, swim, or use a hot tub until your health care provider approves. Ask if you can take a shower or have a sponge bath.  Follow instructions from your health care provider about how to take care of the puncture site. Make sure you: ? Wash your hands with soap and water before you change your bandage (dressing). If soap and water are not available, use hand sanitizer. ? Change your dressing as told by your health care provider.  Check your puncture siteevery day for signs of infection. Check for: ? More redness, swelling, or pain. ? More fluid or blood. ? Warmth. ? Pus or a bad smell.  Return to your normal activities as told by your health care provider. Ask your health care provider what activities are safe for you.  Do not drive for 24 hours if you were given a medicine to help you relax (sedative).  Keep all follow-up visits as told by your health care provider. This is important. °Contact a health care provider if: °· You have more redness, swelling, or pain around the puncture site. °· You have more fluid or blood coming from the puncture site. °· Your puncture site feels warm to the touch. °· You have pus or a bad smell coming from the  puncture site. °· You have a fever. °· Your pain is not controlled with medicine. °This information is not intended to replace advice given to you by your health care provider. Make sure you discuss any questions you have with your health care provider. °Document Released: 09/13/2004 Document Revised: 09/14/2015 Document Reviewed: 08/08/2015 °Elsevier Interactive Patient Education © 2018 [employer] ° °

## 2016-09-17 NOTE — Procedures (Signed)
Interventional Radiology Procedure Note  Procedure: CT guided aspirate and core biopsy of right iliac bone Complications: None Recommendations: - Bedrest supine x 1 hrs - Follow biopsy results  Daine Gunther T. Lowen Barringer, M.D Pager:  [phone]   

## 2016-09-19 ENCOUNTER — Other Ambulatory Visit (HOSPITAL_BASED_OUTPATIENT_CLINIC_OR_DEPARTMENT_OTHER): Payer: 59

## 2016-09-19 ENCOUNTER — Ambulatory Visit (HOSPITAL_BASED_OUTPATIENT_CLINIC_OR_DEPARTMENT_OTHER): Payer: 59

## 2016-09-19 VITALS — BP 106/67 | HR 86 | Temp 98.0°F | Resp 18

## 2016-09-19 DIAGNOSIS — C9001 Multiple myeloma in remission: Secondary | ICD-10-CM

## 2016-09-19 DIAGNOSIS — C9002 Multiple myeloma in relapse: Secondary | ICD-10-CM | POA: Diagnosis not present

## 2016-09-19 DIAGNOSIS — Z5112 Encounter for antineoplastic immunotherapy: Secondary | ICD-10-CM

## 2016-09-19 LAB — COMPREHENSIVE METABOLIC PANEL
ALK PHOS: 55 U/L (ref 40–150)
ALT: 14 U/L (ref 0–55)
AST: 18 U/L (ref 5–34)
Albumin: 3.8 g/dL (ref 3.5–5.0)
Anion Gap: 9 mEq/L (ref 3–11)
BUN: 18.9 mg/dL (ref 7.0–26.0)
CHLORIDE: 103 meq/L (ref 98–109)
CO2: 28 mEq/L (ref 22–29)
Calcium: 9.1 mg/dL (ref 8.4–10.4)
Creatinine: 0.8 mg/dL (ref 0.6–1.1)
EGFR: 83 mL/min/{1.73_m2} — AB (ref >90)
GLUCOSE: 92 mg/dL (ref 70–140)
POTASSIUM: 4.2 meq/L (ref 3.5–5.1)
SODIUM: 140 meq/L (ref 136–145)
Total Bilirubin: 0.42 mg/dL (ref 0.20–1.20)
Total Protein: 6.2 g/dL — ABNORMAL LOW (ref 6.4–8.3)

## 2016-09-19 LAB — CBC WITH DIFFERENTIAL/PLATELET
BASO%: 1.8 % (ref 0.0–2.0)
BASOS ABS: 0.1 10*3/uL (ref 0.0–0.1)
EOS ABS: 0.3 10*3/uL (ref 0.0–0.5)
EOS%: 5.4 % (ref 0.0–7.0)
HCT: 38.2 % (ref 34.8–46.6)
HGB: 12.5 g/dL (ref 11.6–15.9)
LYMPH%: 26.2 % (ref 14.0–49.7)
MCH: 32.7 pg (ref 25.1–34.0)
MCHC: 32.7 g/dL (ref 31.5–36.0)
MCV: 100 fL (ref 79.5–101.0)
MONO#: 1 10*3/uL — ABNORMAL HIGH (ref 0.1–0.9)
MONO%: 18 % — AB (ref 0.0–14.0)
NEUT#: 2.7 10*3/uL (ref 1.5–6.5)
NEUT%: 48.6 % (ref 38.4–76.8)
Platelets: 130 10*3/uL — ABNORMAL LOW (ref 145–400)
RBC: 3.82 10*6/uL (ref 3.70–5.45)
RDW: 12.9 % (ref 11.2–14.5)
WBC: 5.6 10*3/uL (ref 3.9–10.3)
lymph#: 1.5 10*3/uL (ref 0.9–3.3)

## 2016-09-19 MED ORDER — SODIUM CHLORIDE 0.9 % IV SOLN
20.0000 mg | Freq: Once | INTRAVENOUS | Status: AC
  Administered 2016-09-19: 20 mg via INTRAVENOUS
  Filled 2016-09-19: qty 2

## 2016-09-19 MED ORDER — SODIUM CHLORIDE 0.9% FLUSH
10.0000 mL | INTRAVENOUS | Status: DC | PRN
  Administered 2016-09-19: 10 mL
  Filled 2016-09-19: qty 10

## 2016-09-19 MED ORDER — HEPARIN SOD (PORK) LOCK FLUSH 100 UNIT/ML IV SOLN
500.0000 [IU] | Freq: Once | INTRAVENOUS | Status: AC | PRN
  Administered 2016-09-19: 500 [IU]
  Filled 2016-09-19: qty 5

## 2016-09-19 MED ORDER — ACETAMINOPHEN 325 MG PO TABS
ORAL_TABLET | ORAL | Status: AC
  Filled 2016-09-19: qty 2

## 2016-09-19 MED ORDER — SODIUM CHLORIDE 0.9 % IV SOLN
Freq: Once | INTRAVENOUS | Status: AC
  Administered 2016-09-19: 09:00:00 via INTRAVENOUS

## 2016-09-19 MED ORDER — DIPHENHYDRAMINE HCL 25 MG PO CAPS
ORAL_CAPSULE | ORAL | Status: AC
  Filled 2016-09-19: qty 2

## 2016-09-19 MED ORDER — ACETAMINOPHEN 325 MG PO TABS
650.0000 mg | ORAL_TABLET | Freq: Once | ORAL | Status: AC
  Administered 2016-09-19: 650 mg via ORAL

## 2016-09-19 MED ORDER — PROCHLORPERAZINE MALEATE 10 MG PO TABS
ORAL_TABLET | ORAL | Status: AC
  Filled 2016-09-19: qty 1

## 2016-09-19 MED ORDER — MONTELUKAST SODIUM 10 MG PO TABS
ORAL_TABLET | ORAL | Status: AC
  Filled 2016-09-19: qty 1

## 2016-09-19 MED ORDER — MONTELUKAST SODIUM 10 MG PO TABS
10.0000 mg | ORAL_TABLET | Freq: Once | ORAL | Status: AC
  Administered 2016-09-19: 10 mg via ORAL

## 2016-09-19 MED ORDER — PROCHLORPERAZINE MALEATE 10 MG PO TABS
10.0000 mg | ORAL_TABLET | Freq: Once | ORAL | Status: AC
  Administered 2016-09-19: 10 mg via ORAL

## 2016-09-19 MED ORDER — DIPHENHYDRAMINE HCL 25 MG PO CAPS
50.0000 mg | ORAL_CAPSULE | Freq: Once | ORAL | Status: AC
  Administered 2016-09-19: 50 mg via ORAL

## 2016-09-19 MED ORDER — DARATUMUMAB CHEMO INJECTION 400 MG/20ML
15.3000 mg/kg | Freq: Once | INTRAVENOUS | Status: AC
  Administered 2016-09-19: 900 mg via INTRAVENOUS
  Filled 2016-09-19: qty 5

## 2016-09-19 NOTE — Progress Notes (Signed)
Talked with pharmacy Darzalex mixed in 1 liter bag instead of 500 ml bag. Okay to start infusion at 200 ml to make up for extra volume, last infusion of Darzalex 08-08-16 and this is not her first infusion.

## 2016-09-19 NOTE — Patient Instructions (Signed)
[facility] Discharge Instructions for Patients Receiving Chemotherapy  Today you received the following chemotherapy agents Darzalex.  To help prevent nausea and vomiting after your treatment, we encourage you to take your nausea medication as directed.  If you develop nausea and vomiting that is not controlled by your nausea medication, call the clinic.   BELOW ARE SYMPTOMS THAT SHOULD BE REPORTED IMMEDIATELY:  *FEVER GREATER THAN 100.5 F  *CHILLS WITH OR WITHOUT FEVER  NAUSEA AND VOMITING THAT IS NOT CONTROLLED WITH YOUR NAUSEA MEDICATION  *UNUSUAL SHORTNESS OF BREATH  *UNUSUAL BRUISING OR BLEEDING  TENDERNESS IN MOUTH AND THROAT WITH OR WITHOUT PRESENCE OF ULCERS  *URINARY PROBLEMS  *BOWEL PROBLEMS  UNUSUAL RASH Items with * indicate a potential emergency and should be followed up as soon as possible.  Feel free to call the clinic you have any questions or concerns. The clinic phone number is [phone].  Please show the CHEMO ALERT CARD at check-in to the Emergency Department and triage nurse.    

## 2016-09-22 LAB — KAPPA/LAMBDA LIGHT CHAINS
IG KAPPA FREE LIGHT CHAIN: 162.7 mg/L — AB (ref 3.3–19.4)
Ig Lambda Free Light Chain: 2.7 mg/L — ABNORMAL LOW (ref 5.7–26.3)
KAPPA/LAMBDA FLC RATIO: 60.26 — AB (ref 0.26–1.65)

## 2016-09-25 LAB — MULTIPLE MYELOMA PANEL, SERUM
ALBUMIN SERPL ELPH-MCNC: 3.6 g/dL (ref 2.9–4.4)
ALBUMIN/GLOB SERPL: 1.7 (ref 0.7–1.7)
ALPHA 1: 0.3 g/dL (ref 0.0–0.4)
Alpha2 Glob SerPl Elph-Mcnc: 0.8 g/dL (ref 0.4–1.0)
B-GLOBULIN SERPL ELPH-MCNC: 0.8 g/dL (ref 0.7–1.3)
GAMMA GLOB SERPL ELPH-MCNC: 0.3 g/dL — AB (ref 0.4–1.8)
GLOBULIN, TOTAL: 2.2 g/dL (ref 2.2–3.9)
IGA/IMMUNOGLOBULIN A, SERUM: 10 mg/dL — AB (ref 87–352)
IgG, Qn, Serum: 284 mg/dL — ABNORMAL LOW (ref 700–1600)
IgM, Qn, Serum: 13 mg/dL — ABNORMAL LOW (ref 26–217)
M PROTEIN SERPL ELPH-MCNC: 0.1 g/dL — AB
Total Protein: 5.8 g/dL — ABNORMAL LOW (ref 6.0–8.5)

## 2016-09-26 ENCOUNTER — Ambulatory Visit (HOSPITAL_BASED_OUTPATIENT_CLINIC_OR_DEPARTMENT_OTHER): Payer: 59

## 2016-09-26 ENCOUNTER — Ambulatory Visit (HOSPITAL_BASED_OUTPATIENT_CLINIC_OR_DEPARTMENT_OTHER): Payer: 59 | Admitting: Hematology and Oncology

## 2016-09-26 ENCOUNTER — Other Ambulatory Visit (HOSPITAL_BASED_OUTPATIENT_CLINIC_OR_DEPARTMENT_OTHER): Payer: 59

## 2016-09-26 VITALS — BP 118/70 | HR 84 | Temp 98.0°F | Resp 18

## 2016-09-26 DIAGNOSIS — C9002 Multiple myeloma in relapse: Secondary | ICD-10-CM

## 2016-09-26 DIAGNOSIS — K639 Disease of intestine, unspecified: Secondary | ICD-10-CM

## 2016-09-26 DIAGNOSIS — C9001 Multiple myeloma in remission: Secondary | ICD-10-CM

## 2016-09-26 DIAGNOSIS — Z5112 Encounter for antineoplastic immunotherapy: Secondary | ICD-10-CM

## 2016-09-26 LAB — COMPREHENSIVE METABOLIC PANEL
ALK PHOS: 58 U/L (ref 40–150)
ALT: 16 U/L (ref 0–55)
ANION GAP: 9 meq/L (ref 3–11)
AST: 21 U/L (ref 5–34)
Albumin: 4 g/dL (ref 3.5–5.0)
BILIRUBIN TOTAL: 0.41 mg/dL (ref 0.20–1.20)
BUN: 18 mg/dL (ref 7.0–26.0)
CALCIUM: 9 mg/dL (ref 8.4–10.4)
CO2: 27 meq/L (ref 22–29)
CREATININE: 0.8 mg/dL (ref 0.6–1.1)
Chloride: 104 mEq/L (ref 98–109)
EGFR: 86 mL/min/{1.73_m2} — ABNORMAL LOW (ref >90)
Glucose: 92 mg/dl (ref 70–140)
Potassium: 3.8 mEq/L (ref 3.5–5.1)
Sodium: 139 mEq/L (ref 136–145)
TOTAL PROTEIN: 6.1 g/dL — AB (ref 6.4–8.3)

## 2016-09-26 LAB — CBC WITH DIFFERENTIAL/PLATELET
BASO%: 2.4 % — ABNORMAL HIGH (ref 0.0–2.0)
Basophils Absolute: 0.1 10*3/uL (ref 0.0–0.1)
EOS ABS: 0.4 10*3/uL (ref 0.0–0.5)
EOS%: 8.8 % — ABNORMAL HIGH (ref 0.0–7.0)
HEMATOCRIT: 38.3 % (ref 34.8–46.6)
HGB: 12.6 g/dL (ref 11.6–15.9)
LYMPH#: 1.4 10*3/uL (ref 0.9–3.3)
LYMPH%: 27.2 % (ref 14.0–49.7)
MCH: 32.6 pg (ref 25.1–34.0)
MCHC: 32.9 g/dL (ref 31.5–36.0)
MCV: 99.2 fL (ref 79.5–101.0)
MONO#: 0.7 10*3/uL (ref 0.1–0.9)
MONO%: 14.6 % — ABNORMAL HIGH (ref 0.0–14.0)
NEUT%: 47 % (ref 38.4–76.8)
NEUTROS ABS: 2.4 10*3/uL (ref 1.5–6.5)
PLATELETS: 155 10*3/uL (ref 145–400)
RBC: 3.86 10*6/uL (ref 3.70–5.45)
RDW: 13.2 % (ref 11.2–14.5)
WBC: 5 10*3/uL (ref 3.9–10.3)

## 2016-09-26 MED ORDER — HEPARIN SOD (PORK) LOCK FLUSH 100 UNIT/ML IV SOLN
500.0000 [IU] | Freq: Once | INTRAVENOUS | Status: AC | PRN
  Administered 2016-09-26: 500 [IU]
  Filled 2016-09-26: qty 5

## 2016-09-26 MED ORDER — SODIUM CHLORIDE 0.9% FLUSH
10.0000 mL | INTRAVENOUS | Status: DC | PRN
  Administered 2016-09-26: 10 mL
  Filled 2016-09-26: qty 10

## 2016-09-26 MED ORDER — ACETAMINOPHEN 325 MG PO TABS
ORAL_TABLET | ORAL | Status: AC
  Filled 2016-09-26: qty 2

## 2016-09-26 MED ORDER — PROCHLORPERAZINE MALEATE 10 MG PO TABS
10.0000 mg | ORAL_TABLET | Freq: Once | ORAL | Status: AC
  Administered 2016-09-26: 10 mg via ORAL

## 2016-09-26 MED ORDER — SODIUM CHLORIDE 0.9 % IV SOLN
Freq: Once | INTRAVENOUS | Status: AC
  Administered 2016-09-26: 10:00:00 via INTRAVENOUS

## 2016-09-26 MED ORDER — PROCHLORPERAZINE MALEATE 10 MG PO TABS
ORAL_TABLET | ORAL | Status: AC
  Filled 2016-09-26: qty 1

## 2016-09-26 MED ORDER — ACETAMINOPHEN 325 MG PO TABS
650.0000 mg | ORAL_TABLET | Freq: Once | ORAL | Status: AC
  Administered 2016-09-26: 650 mg via ORAL

## 2016-09-26 MED ORDER — SODIUM CHLORIDE 0.9 % IV SOLN
20.0000 mg | Freq: Once | INTRAVENOUS | Status: AC
  Administered 2016-09-26: 20 mg via INTRAVENOUS
  Filled 2016-09-26: qty 2

## 2016-09-26 MED ORDER — DIPHENHYDRAMINE HCL 25 MG PO CAPS
50.0000 mg | ORAL_CAPSULE | Freq: Once | ORAL | Status: AC
  Administered 2016-09-26: 50 mg via ORAL

## 2016-09-26 MED ORDER — DIPHENHYDRAMINE HCL 25 MG PO CAPS
ORAL_CAPSULE | ORAL | Status: AC
  Filled 2016-09-26: qty 2

## 2016-09-26 MED ORDER — SODIUM CHLORIDE 0.9 % IV SOLN
15.3000 mg/kg | Freq: Once | INTRAVENOUS | Status: AC
  Administered 2016-09-26: 900 mg via INTRAVENOUS
  Filled 2016-09-26: qty 40

## 2016-09-26 NOTE — Patient Instructions (Signed)
[facility] Discharge Instructions for Patients Receiving Chemotherapy  Today you received the following chemotherapy agents Darzalex.  To help prevent nausea and vomiting after your treatment, we encourage you to take your nausea medication as directed.  If you develop nausea and vomiting that is not controlled by your nausea medication, call the clinic.   BELOW ARE SYMPTOMS THAT SHOULD BE REPORTED IMMEDIATELY:  *FEVER GREATER THAN 100.5 F  *CHILLS WITH OR WITHOUT FEVER  NAUSEA AND VOMITING THAT IS NOT CONTROLLED WITH YOUR NAUSEA MEDICATION  *UNUSUAL SHORTNESS OF BREATH  *UNUSUAL BRUISING OR BLEEDING  TENDERNESS IN MOUTH AND THROAT WITH OR WITHOUT PRESENCE OF ULCERS  *URINARY PROBLEMS  *BOWEL PROBLEMS  UNUSUAL RASH Items with * indicate a potential emergency and should be followed up as soon as possible.  Feel free to call the clinic you have any questions or concerns. The clinic phone number is [phone].  Please show the CHEMO ALERT CARD at check-in to the Emergency Department and triage nurse.    

## 2016-09-28 ENCOUNTER — Encounter: Payer: Self-pay | Admitting: Hematology and Oncology

## 2016-09-28 DIAGNOSIS — K639 Disease of intestine, unspecified: Secondary | ICD-10-CM | POA: Insufficient documentation

## 2016-09-28 NOTE — Assessment & Plan Note (Signed)
I shared with her and her husband results of her bone marrow biopsy and PET CT scan We will continue treatment without dose adjustment She is reminded to take antimicrobial prophylaxis and aspirin for DVT prophylaxis She is on calcium with vitamin D and she gets Zometa every 3 months

## 2016-09-28 NOTE — Progress Notes (Signed)
[facility] OFF[facility] PROGRESS NOTE  Patient Care Team: [name], MD as PCP - General [name], MD as Consulting Physician (Oncology) [name], MD as Consulting Physician (Neurosurgery) [name], MD as Consulting Physician (Hematology and Oncology) [name], [name], MD as Referring Physician (Hematology and Oncology)  SUMMARY OF ONCOLOGIC HISTORY:   Multiple myeloma in relapse [facility])   07/21/2007 Bone Marrow Biopsy    Case #: [id number] Bone marrow showed myeloma      07/21/2007 Miscellaneous    She was diagnosed in May 2009. Had several cycles of RVD      10/14/2007 Bone Marrow Biopsy    Case #: [id number] repeat bone marrow biopsy showed good response to Rx      12/31/2007 Bone Marrow Transplant    She had autologous BMT at [facility]      07/19/2009 Bone Marrow Biopsy    Case #: [id number] Bone marrow biopsy showed only 1% plasma cells      10/10/2010 Bone Marrow Biopsy    [id number] Bone marrow biopsy showed 2% plasma cells      09/25/2011 Bone Marrow Biopsy    Accession #: [id number] Bone marrow only showed 4% plasma cells with ligh chain excess      10/14/2011 - 06/13/2013 Chemotherapy    She received Revlimid only, discontinued due to pancytopenia      06/18/2015 - 09/13/2015 Chemotherapy    She resumed taking Revlimid and dexamethasone. Treatment is stopped due to minimum response and pancytopenia      10/19/2015 -  Chemotherapy    The patient had Velcade for chemotherapy treatment along with weekly Daratumumab. Last dose of Velcade on 02/29/16, stopped due to progression. Pomalidomide added on 03/14/16      04/11/2016 Adverse Reaction    Delay resumption of Pomalyst cycle 2 until 04/16/16 due to neutropenia      09/16/2016 PET scan    1. No findings of active bony lymphoma. Prior activity in the sternum and thoracic spine has resolved. 2. Focal hypermetabolic activity along the sigmoid colon with some local inflammatory stranding  in the adjacent mesentery, maximum SUV 11.0. This is probably from mild acute diverticulitis. There is no hypermetabolic activity in this vicinity 4 months ago to suggest that this is from colon cancer. Correlate with any symptoms. 3. Acute right maxillary sinusitis. 4. Thoracolumbar compression fractures, vertebral augmentation at the L1 level.      09/17/2016 Procedure    CT guided bone marrow biopsy of right posterior iliac bone with both aspirate and core samples obtained.      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 - TRILINEAGE HEMATOPOIESIS. - SEE COMMENT. PERIPHERAL BLOOD: - LYMPHOPENIA. - THROMBOCYTOPENIA. Diagnosis Note The bone marrow is slightly hypocellular for age with trilineage hematopoiesis and non-specific myeloid changes likely related to previous therapy. Plasma cells are increased in number representing 8% of all cells in the aspirate, but with lack of large aggregates or sheets in the clot and biopsy sections. Immunohistochemical stains highlight the slightly increased plasma cell component in the marrow which shows kappa light chain restriction consistent with residual/recurrent plasma cell neoplasm. Correlation with cytogenetic and FISH studies recommended       INTERVAL HISTORY: Please see below for problem oriented charting. She is seen in the infusion room She feels well No recent infection No new bone pain The patient denies any  recent signs or symptoms of bleeding such as spontaneous epistaxis, hematuria or hematochezia.   REVIEW OF SYSTEMS:   Constitutional: Denies fevers, chills or abnormal weight loss Eyes: Denies blurriness of vision Ears, nose, mouth, throat, and face: Denies mucositis or sore throat Respiratory: Denies cough, dyspnea or wheezes Cardiovascular: Denies palpitation, chest discomfort or lower extremity  swelling Gastrointestinal:  Denies nausea, heartburn or change in bowel habits Skin: Denies abnormal skin rashes Lymphatics: Denies new lymphadenopathy or easy bruising Neurological:Denies numbness, tingling or new weaknesses Behavioral/Psych: Mood is stable, no new changes  All other systems were reviewed with the patient and are negative.  I have reviewed the past medical history, past surgical history, social history and family history with the patient and they are unchanged from previous note.  ALLERGIES:  is allergic to hydromorphone.  MEDICATIONS:  Current Outpatient Prescriptions  Medication Sig Dispense Refill  . acyclovir (ZOVIRAX) 400 MG tablet Take 1 tablet (400 mg total) by mouth 2 (two) times daily. 60 tablet 11  . aspirin 81 MG tablet Take 81 mg by mouth.    . cholecalciferol (VITAMIN D) 1000 UNITS tablet Take 1,000 Units by mouth daily.    Marland Kitchen dexamethasone (DECADRON) 4 MG tablet Take 5 tabs once a week with breakfast every Tuesdays 90 tablet 1  . Multiple Vitamins-Minerals (ONE-A-DAY 50 PLUS PO) Take by mouth daily.    Marland Kitchen oxycodone (OXY-IR) 5 MG capsule Take 5 mg by mouth every 4 (four) hours as needed. pain    . Oxycodone HCl (OXYCONTIN) 60 MG TB12 Take 1 tablet by mouth 2 (two) times daily.    . pantoprazole (PROTONIX) 40 MG tablet Take 1 tablet (40 mg total) by mouth daily. 30 tablet 9  . POMALYST 2 MG capsule Take 1 tablet (63m) by mouth daily. Take on days 1-21. Repeat every 28 days 21 capsule 10   No current facility-administered medications for this visit.     PHYSICAL EXAMINATION: ECOG PERFORMANCE STATUS: 0 - Asymptomatic GENERAL:alert, no distress and comfortable SKIN: skin color, texture, turgor are normal, no rashes or significant lesions EYES: normal, Conjunctiva are pink and non-injected, sclera clear OROPHARYNX:no exudate, no erythema and lips, buccal mucosa, and tongue normal  NECK: supple, thyroid normal size, non-tender, without nodularity LYMPH:  no  palpable lymphadenopathy in the cervical, axillary or inguinal LUNGS: clear to auscultation and percussion with normal breathing effort HEART: regular rate & rhythm and no murmurs and no lower extremity edema ABDOMEN:abdomen soft, non-tender and normal bowel sounds Musculoskeletal:no cyanosis of digits and no clubbing  NEURO: alert & oriented x 3 with fluent speech, no focal motor/sensory deficits  LABORATORY DATA:  I have reviewed the data as listed    Component Value Date/Time   NA 139 09/26/2016 0943   K 3.8 09/26/2016 0943   CL 107 08/30/2012 1055   CO2 27 09/26/2016 0943   GLUCOSE 92 09/26/2016 0943   GLUCOSE 102 (H) 08/30/2012 1055   BUN 18.0 09/26/2016 0943   CREATININE 0.8 09/26/2016 0943   CALCIUM 9.0 09/26/2016 0943   PROT 6.1 (L) 09/26/2016 0943   ALBUMIN 4.0 09/26/2016 0943   AST 21 09/26/2016 0943   ALT 16 09/26/2016 0943   ALKPHOS 58 09/26/2016 0943   BILITOT 0.41 09/26/2016 0943    No results found for: SPEP, UPEP  Lab Results  Component Value Date   WBC 5.0 09/26/2016   NEUTROABS 2.4 09/26/2016   HGB 12.6 09/26/2016   HCT 38.3 09/26/2016   MCV 99.2 09/26/2016  PLT 155 09/26/2016      Chemistry      Component Value Date/Time   NA 139 09/26/2016 0943   K 3.8 09/26/2016 0943   CL 107 08/30/2012 1055   CO2 27 09/26/2016 0943   BUN 18.0 09/26/2016 0943   CREATININE 0.8 09/26/2016 0943      Component Value Date/Time   CALCIUM 9.0 09/26/2016 0943   ALKPHOS 58 09/26/2016 0943   AST 21 09/26/2016 0943   ALT 16 09/26/2016 0943   BILITOT 0.41 09/26/2016 0943       RADIOGRAPHIC STUDIES: I reviewed imaging study with the patient I have personally reviewed the radiological images as listed and agreed with the findings in the report. Nm Pet Image Restage (ps) Whole Body  Addendum Date: 09/16/2016   ADDENDUM REPORT: 09/16/2016 15:44 ADDENDUM: The original report was by Dr. Van Clines. The following addendum is by Dr. Van Clines: Central  high density in the uterus could represent a fibroid or endometrial polyp, and appears similar to prior exams. This lesion is not hypermetabolic and accordingly I am skeptical of endometrial cancer. Electronically Signed   By: Van Clines M.D.   On: 09/16/2016 15:44   Result Date: 09/16/2016 CLINICAL DATA:  Subsequent treatment strategy for multiple myeloma. EXAM: NUCLEAR MEDICINE PET WHOLE BODY TECHNIQUE: 6.4 mCi F-18 FDG was injected intravenously. Full-ring PET imaging was performed from the vertex to the feet after the radiotracer. CT data was obtained and used for attenuation correction and anatomic localization. FASTING BLOOD GLUCOSE:  Value: 103 mg/dl COMPARISON:  06/05/2015 FINDINGS: HEAD/NECK No hypermetabolic activity in the scalp. No hypermetabolic cervical lymph nodes. Air fluid level in the right maxillary sinus suggesting acute sinusitis. Glottic activity is likely physiologic. CHEST No hypermetabolic mediastinal or hilar nodes. No suspicious pulmonary nodules on the CT scan. Scarring or atelectasis in the right middle lobe and lingula. ABDOMEN/PELVIS 1.5 cm hypodense lesion posteriorly in the right hepatic lobe, not appreciably changed from prior, photopenic. Likely a cyst. Mildly distended gallbladder.  Pancreas and spleen unremarkable. Prominent stool throughout the colon favors constipation. Hypermetabolic activity along the sigmoid colon with some local inflammatory findings in the left lower quadrant, maximum SUV in this vicinity 11.0. This was not present on the prior exam and accordingly I am skeptical of colon cancer, although colon malignancy could cause a similar appearance otherwise. SKELETON The previously sternal hypermetabolic activity has resolved. No current accentuated abnormal osseous activity to suggest active bony myeloma. Thoracolumbar compression fractures most notable at L1 and L2, there is vertebral augmentation at L1. EXTREMITIES No abnormal hypermetabolic activity in  the lower extremities. IMPRESSION: 1. No findings of active bony lymphoma. Prior activity in the sternum and thoracic spine has resolved. 2. Focal hypermetabolic activity along the sigmoid colon with some local inflammatory stranding in the adjacent mesentery, maximum SUV 11.0. This is probably from mild acute diverticulitis. There is no hypermetabolic activity in this vicinity 4 months ago to suggest that this is from colon cancer. Correlate with any symptoms. 3. Acute right maxillary sinusitis. 4. Thoracolumbar compression fractures, vertebral augmentation at the L1 level. Electronically Signed: By: Van Clines M.D. On: 09/16/2016 13:57   Ct Biopsy  Result Date: 09/17/2016 CLINICAL DATA:  Multiple myeloma and relapse. The patient requires bone marrow biopsy. EXAM: CT GUIDED BONE MARROW ASPIRATION AND BIOPSY ANESTHESIA/SEDATION: Versed 2.0 mg IV, Fentanyl 100 mcg IV Total Moderate Sedation Time:  11 minutes. The patient's level of consciousness and physiologic status were continuously monitored during the procedure  by Radiology nursing. PROCEDURE: The procedure risks, benefits, and alternatives were explained to the patient. Questions regarding the procedure were encouraged and answered. The patient understands and consents to the procedure. A time out was performed prior to initiating the procedure. The right gluteal region was prepped with chlorhexidine. Sterile gown and sterile gloves were used for the procedure. Local anesthesia was provided with 1% Lidocaine. Under CT guidance, an 11 gauge On Control bone cutting needle was advanced from a posterior approach into the right iliac bone. Needle positioning was confirmed with CT. Initial non heparinized and heparinized aspirate samples were obtained of bone marrow. Core biopsy was performed via the On Control drill needle. COMPLICATIONS: None FINDINGS: Inspection of initial aspirate did reveal visible particles. Intact core biopsy sample was obtained.  IMPRESSION: CT guided bone marrow biopsy of right posterior iliac bone with both aspirate and core samples obtained. Electronically Signed   By: Aletta Edouard M.D.   On: 09/17/2016 17:39   Ct Bone Marrow Biopsy & Aspiration  Result Date: 09/17/2016 CLINICAL DATA:  Multiple myeloma and relapse. The patient requires bone marrow biopsy. EXAM: CT GUIDED BONE MARROW ASPIRATION AND BIOPSY ANESTHESIA/SEDATION: Versed 2.0 mg IV, Fentanyl 100 mcg IV Total Moderate Sedation Time:  11 minutes. The patient's level of consciousness and physiologic status were continuously monitored during the procedure by Radiology nursing. PROCEDURE: The procedure risks, benefits, and alternatives were explained to the patient. Questions regarding the procedure were encouraged and answered. The patient understands and consents to the procedure. A time out was performed prior to initiating the procedure. The right gluteal region was prepped with chlorhexidine. Sterile gown and sterile gloves were used for the procedure. Local anesthesia was provided with 1% Lidocaine. Under CT guidance, an 11 gauge On Control bone cutting needle was advanced from a posterior approach into the right iliac bone. Needle positioning was confirmed with CT. Initial non heparinized and heparinized aspirate samples were obtained of bone marrow. Core biopsy was performed via the On Control drill needle. COMPLICATIONS: None FINDINGS: Inspection of initial aspirate did reveal visible particles. Intact core biopsy sample was obtained. IMPRESSION: CT guided bone marrow biopsy of right posterior iliac bone with both aspirate and core samples obtained. Electronically Signed   By: Aletta Edouard M.D.   On: 09/17/2016 17:39    ASSESSMENT & PLAN:  Multiple myeloma in relapse ([place]) I shared with her and her husband results of her bone marrow biopsy and PET CT scan We will continue treatment without dose adjustment She is reminded to take antimicrobial prophylaxis and  aspirin for DVT prophylaxis She is on calcium with vitamin D and she gets Zometa every 3 months   Lesion of colon The cause of the anomaly seen on PET CT scan is unknown She is not symptomatic The patient has never had colonoscopy We will proceed with colonoscopy screening at the end of the year after she completes induction chemo.   No orders of the defined types were placed in this encounter.  All questions were answered. The patient knows to call the clinic with any problems, questions or concerns. No barriers to learning was detected. I spent 15 minutes counseling the patient face to face. The total time spent in the appointment was 20 minutes and more than 50% was on counseling and review of test results     [name], MD 09/28/2016 12:47 PM

## 2016-09-28 NOTE — Assessment & Plan Note (Signed)
The cause of the anomaly seen on PET CT scan is unknown She is not symptomatic The patient has never had colonoscopy We will proceed with colonoscopy screening at the end of the year after she completes induction chemo.

## 2016-10-03 ENCOUNTER — Encounter (HOSPITAL_COMMUNITY): Payer: Self-pay

## 2016-10-03 ENCOUNTER — Other Ambulatory Visit (HOSPITAL_BASED_OUTPATIENT_CLINIC_OR_DEPARTMENT_OTHER): Payer: 59

## 2016-10-03 ENCOUNTER — Ambulatory Visit (HOSPITAL_BASED_OUTPATIENT_CLINIC_OR_DEPARTMENT_OTHER): Payer: 59

## 2016-10-03 ENCOUNTER — Telehealth: Payer: Self-pay

## 2016-10-03 ENCOUNTER — Ambulatory Visit: Payer: 59

## 2016-10-03 VITALS — BP 101/69 | HR 76 | Temp 98.3°F | Resp 18

## 2016-10-03 DIAGNOSIS — C9002 Multiple myeloma in relapse: Secondary | ICD-10-CM

## 2016-10-03 DIAGNOSIS — Z5112 Encounter for antineoplastic immunotherapy: Secondary | ICD-10-CM

## 2016-10-03 DIAGNOSIS — C9001 Multiple myeloma in remission: Secondary | ICD-10-CM

## 2016-10-03 DIAGNOSIS — Z95828 Presence of other vascular implants and grafts: Secondary | ICD-10-CM

## 2016-10-03 LAB — CBC WITH DIFFERENTIAL/PLATELET
BASO%: 1.9 % (ref 0.0–2.0)
Basophils Absolute: 0.1 10*3/uL (ref 0.0–0.1)
EOS%: 7.4 % — AB (ref 0.0–7.0)
Eosinophils Absolute: 0.4 10*3/uL (ref 0.0–0.5)
HCT: 37 % (ref 34.8–46.6)
HGB: 12.2 g/dL (ref 11.6–15.9)
LYMPH%: 23.3 % (ref 14.0–49.7)
MCH: 32.8 pg (ref 25.1–34.0)
MCHC: 33 g/dL (ref 31.5–36.0)
MCV: 99.5 fL (ref 79.5–101.0)
MONO#: 1.2 10*3/uL — AB (ref 0.1–0.9)
MONO%: 21.1 % — AB (ref 0.0–14.0)
NEUT%: 46.3 % (ref 38.4–76.8)
NEUTROS ABS: 2.7 10*3/uL (ref 1.5–6.5)
PLATELETS: 187 10*3/uL (ref 145–400)
RBC: 3.72 10*6/uL (ref 3.70–5.45)
RDW: 13.4 % (ref 11.2–14.5)
WBC: 5.8 10*3/uL (ref 3.9–10.3)
lymph#: 1.4 10*3/uL (ref 0.9–3.3)

## 2016-10-03 LAB — COMPREHENSIVE METABOLIC PANEL
ALT: 13 U/L (ref 0–55)
AST: 20 U/L (ref 5–34)
Albumin: 3.8 g/dL (ref 3.5–5.0)
Alkaline Phosphatase: 58 U/L (ref 40–150)
Anion Gap: 8 mEq/L (ref 3–11)
BILIRUBIN TOTAL: 0.47 mg/dL (ref 0.20–1.20)
BUN: 16.9 mg/dL (ref 7.0–26.0)
CO2: 26 meq/L (ref 22–29)
CREATININE: 0.8 mg/dL (ref 0.6–1.1)
Calcium: 8.8 mg/dL (ref 8.4–10.4)
Chloride: 106 mEq/L (ref 98–109)
EGFR: 89 mL/min/{1.73_m2} — ABNORMAL LOW (ref >90)
GLUCOSE: 81 mg/dL (ref 70–140)
Potassium: 3.9 mEq/L (ref 3.5–5.1)
SODIUM: 140 meq/L (ref 136–145)
TOTAL PROTEIN: 5.7 g/dL — AB (ref 6.4–8.3)

## 2016-10-03 MED ORDER — PROCHLORPERAZINE MALEATE 10 MG PO TABS
ORAL_TABLET | ORAL | Status: AC
Start: 2016-10-03 — End: 2016-10-03
  Filled 2016-10-03: qty 1

## 2016-10-03 MED ORDER — SODIUM CHLORIDE 0.9% FLUSH
10.0000 mL | INTRAVENOUS | Status: DC | PRN
  Administered 2016-10-03: 10 mL
  Filled 2016-10-03: qty 10

## 2016-10-03 MED ORDER — HEPARIN SOD (PORK) LOCK FLUSH 100 UNIT/ML IV SOLN
500.0000 [IU] | Freq: Once | INTRAVENOUS | Status: AC | PRN
Start: 2016-10-03 — End: 2016-10-03
  Administered 2016-10-03: 500 [IU]
  Filled 2016-10-03: qty 5

## 2016-10-03 MED ORDER — SODIUM CHLORIDE 0.9 % IV SOLN
Freq: Once | INTRAVENOUS | Status: AC
  Administered 2016-10-03: 09:00:00 via INTRAVENOUS

## 2016-10-03 MED ORDER — ACETAMINOPHEN 325 MG PO TABS
ORAL_TABLET | ORAL | Status: AC
  Filled 2016-10-03: qty 2

## 2016-10-03 MED ORDER — ZOLEDRONIC ACID 4 MG/5ML IV CONC: 4.0000 mg | Freq: Once | INTRAVENOUS | Status: DC

## 2016-10-03 MED ORDER — PROCHLORPERAZINE MALEATE 10 MG PO TABS
10.0000 mg | ORAL_TABLET | Freq: Once | ORAL | Status: AC
  Administered 2016-10-03: 10 mg via ORAL

## 2016-10-03 MED ORDER — ACETAMINOPHEN 325 MG PO TABS
650.0000 mg | ORAL_TABLET | Freq: Once | ORAL | Status: AC
  Administered 2016-10-03: 650 mg via ORAL

## 2016-10-03 MED ORDER — SODIUM CHLORIDE 0.9 % IV SOLN
15.2000 mg/kg | Freq: Once | INTRAVENOUS | Status: AC
  Administered 2016-10-03: 900 mg via INTRAVENOUS
  Filled 2016-10-03: qty 40

## 2016-10-03 MED ORDER — DIPHENHYDRAMINE HCL 25 MG PO CAPS
ORAL_CAPSULE | ORAL | Status: AC
  Filled 2016-10-03: qty 2

## 2016-10-03 MED ORDER — DIPHENHYDRAMINE HCL 25 MG PO CAPS
50.0000 mg | ORAL_CAPSULE | Freq: Once | ORAL | Status: AC
Start: 2016-10-03 — End: 2016-10-03
  Administered 2016-10-03: 50 mg via ORAL

## 2016-10-03 MED ORDER — SODIUM CHLORIDE 0.9 % IV SOLN
20.0000 mg | Freq: Once | INTRAVENOUS | Status: AC
  Administered 2016-10-03: 20 mg via INTRAVENOUS
  Filled 2016-10-03: qty 2

## 2016-10-03 MED ORDER — SODIUM CHLORIDE 0.9 % IJ SOLN
10.0000 mL | INTRAMUSCULAR | Status: DC | PRN
  Filled 2016-10-03: qty 10

## 2016-10-03 NOTE — Patient Instructions (Signed)
[facility] Discharge Instructions for Patients Receiving Chemotherapy  Today you received the following chemotherapy agents Darzalex.  To help prevent nausea and vomiting after your treatment, we encourage you to take your nausea medication as directed.  If you develop nausea and vomiting that is not controlled by your nausea medication, call the clinic.   BELOW ARE SYMPTOMS THAT SHOULD BE REPORTED IMMEDIATELY:  *FEVER GREATER THAN 100.5 F  *CHILLS WITH OR WITHOUT FEVER  NAUSEA AND VOMITING THAT IS NOT CONTROLLED WITH YOUR NAUSEA MEDICATION  *UNUSUAL SHORTNESS OF BREATH  *UNUSUAL BRUISING OR BLEEDING  TENDERNESS IN MOUTH AND THROAT WITH OR WITHOUT PRESENCE OF ULCERS  *URINARY PROBLEMS  *BOWEL PROBLEMS  UNUSUAL RASH Items with * indicate a potential emergency and should be followed up as soon as possible.  Feel free to call the clinic you have any questions or concerns. The clinic phone number is [phone].  Please show the CHEMO ALERT CARD at check-in to the Emergency Department and triage nurse.    

## 2016-10-03 NOTE — Progress Notes (Signed)
Pt requested for port to be accessed in infusion. Pt stated " they always do it in the back." Charge nurse Delle Reining was called and pt was arrived to infusion. Porsche Cates LPN

## 2016-10-03 NOTE — Telephone Encounter (Signed)
Husband called that Dr Erlinda Hong office is requesting a referral from Dr Alvy Bimler for a colonoscopy d/t the pt's health history. Per OV note 7/20, colonoscopy to be done at end of year after completion of induction chemo. Referral not placed during this telephone encounter.

## 2016-10-06 LAB — CHROMOSOME ANALYSIS, BONE MARROW

## 2016-10-06 LAB — TISSUE HYBRIDIZATION (BONE MARROW)-NCBH

## 2016-10-10 ENCOUNTER — Other Ambulatory Visit (HOSPITAL_BASED_OUTPATIENT_CLINIC_OR_DEPARTMENT_OTHER): Payer: 59

## 2016-10-10 ENCOUNTER — Ambulatory Visit (HOSPITAL_BASED_OUTPATIENT_CLINIC_OR_DEPARTMENT_OTHER): Payer: 59

## 2016-10-10 ENCOUNTER — Ambulatory Visit: Payer: 59

## 2016-10-10 VITALS — BP 113/76 | HR 72 | Temp 98.4°F | Resp 18

## 2016-10-10 DIAGNOSIS — C9002 Multiple myeloma in relapse: Secondary | ICD-10-CM

## 2016-10-10 DIAGNOSIS — Z5112 Encounter for antineoplastic immunotherapy: Secondary | ICD-10-CM

## 2016-10-10 DIAGNOSIS — Z95828 Presence of other vascular implants and grafts: Secondary | ICD-10-CM

## 2016-10-10 DIAGNOSIS — C9001 Multiple myeloma in remission: Secondary | ICD-10-CM

## 2016-10-10 LAB — CBC WITH DIFFERENTIAL/PLATELET
BASO%: 2.5 % — ABNORMAL HIGH (ref 0.0–2.0)
BASOS ABS: 0.1 10*3/uL (ref 0.0–0.1)
EOS ABS: 0.6 10*3/uL — AB (ref 0.0–0.5)
EOS%: 14.2 % — AB (ref 0.0–7.0)
HEMATOCRIT: 37.1 % (ref 34.8–46.6)
HEMOGLOBIN: 12.4 g/dL (ref 11.6–15.9)
LYMPH#: 1.3 10*3/uL (ref 0.9–3.3)
LYMPH%: 30.2 % (ref 14.0–49.7)
MCH: 33.2 pg (ref 25.1–34.0)
MCHC: 33.4 g/dL (ref 31.5–36.0)
MCV: 99.2 fL (ref 79.5–101.0)
MONO#: 0.9 10*3/uL (ref 0.1–0.9)
MONO%: 22.2 % — ABNORMAL HIGH (ref 0.0–14.0)
NEUT#: 1.3 10*3/uL — ABNORMAL LOW (ref 1.5–6.5)
NEUT%: 30.9 % — ABNORMAL LOW (ref 38.4–76.8)
Platelets: 113 10*3/uL — ABNORMAL LOW (ref 145–400)
RBC: 3.74 10*6/uL (ref 3.70–5.45)
RDW: 13.8 % (ref 11.2–14.5)
WBC: 4.1 10*3/uL (ref 3.9–10.3)

## 2016-10-10 LAB — COMPREHENSIVE METABOLIC PANEL
ALBUMIN: 3.7 g/dL (ref 3.5–5.0)
ALK PHOS: 54 U/L (ref 40–150)
ALT: 13 U/L (ref 0–55)
AST: 18 U/L (ref 5–34)
Anion Gap: 7 mEq/L (ref 3–11)
BILIRUBIN TOTAL: 0.44 mg/dL (ref 0.20–1.20)
BUN: 18 mg/dL (ref 7.0–26.0)
CALCIUM: 8.8 mg/dL (ref 8.4–10.4)
CO2: 27 mEq/L (ref 22–29)
Chloride: 104 mEq/L (ref 98–109)
Creatinine: 0.8 mg/dL (ref 0.6–1.1)
EGFR: 87 mL/min/{1.73_m2} — ABNORMAL LOW (ref >90)
Glucose: 85 mg/dl (ref 70–140)
POTASSIUM: 3.9 meq/L (ref 3.5–5.1)
Sodium: 139 mEq/L (ref 136–145)
Total Protein: 5.7 g/dL — ABNORMAL LOW (ref 6.4–8.3)

## 2016-10-10 MED ORDER — PROCHLORPERAZINE MALEATE 10 MG PO TABS
10.0000 mg | ORAL_TABLET | Freq: Once | ORAL | Status: AC
  Administered 2016-10-10: 10 mg via ORAL

## 2016-10-10 MED ORDER — DIPHENHYDRAMINE HCL 25 MG PO CAPS
50.0000 mg | ORAL_CAPSULE | Freq: Once | ORAL | Status: AC
  Administered 2016-10-10: 50 mg via ORAL

## 2016-10-10 MED ORDER — SODIUM CHLORIDE 0.9 % IV SOLN
Freq: Once | INTRAVENOUS | Status: AC
  Administered 2016-10-10: 11:00:00 via INTRAVENOUS

## 2016-10-10 MED ORDER — ACETAMINOPHEN 325 MG PO TABS
650.0000 mg | ORAL_TABLET | Freq: Once | ORAL | Status: AC
  Administered 2016-10-10: 650 mg via ORAL

## 2016-10-10 MED ORDER — HEPARIN SOD (PORK) LOCK FLUSH 100 UNIT/ML IV SOLN
500.0000 [IU] | Freq: Once | INTRAVENOUS | Status: AC | PRN
  Administered 2016-10-10: 500 [IU]
  Filled 2016-10-10: qty 5

## 2016-10-10 MED ORDER — PROCHLORPERAZINE MALEATE 10 MG PO TABS
ORAL_TABLET | ORAL | Status: AC
  Filled 2016-10-10: qty 1

## 2016-10-10 MED ORDER — DEXAMETHASONE SODIUM PHOSPHATE 100 MG/10ML IJ SOLN
20.0000 mg | Freq: Once | INTRAMUSCULAR | Status: AC
  Administered 2016-10-10: 20 mg via INTRAVENOUS
  Filled 2016-10-10: qty 2

## 2016-10-10 MED ORDER — SODIUM CHLORIDE 0.9% FLUSH
10.0000 mL | INTRAVENOUS | Status: DC | PRN
Start: 2016-10-10 — End: 2016-10-10
  Administered 2016-10-10: 10 mL
  Filled 2016-10-10: qty 10

## 2016-10-10 MED ORDER — DIPHENHYDRAMINE HCL 25 MG PO CAPS
ORAL_CAPSULE | ORAL | Status: AC
  Filled 2016-10-10: qty 2

## 2016-10-10 MED ORDER — ACETAMINOPHEN 325 MG PO TABS
ORAL_TABLET | ORAL | Status: AC
  Filled 2016-10-10: qty 2

## 2016-10-10 MED ORDER — SODIUM CHLORIDE 0.9 % IJ SOLN
10.0000 mL | INTRAMUSCULAR | Status: DC | PRN
  Administered 2016-10-10: 10 mL via INTRAVENOUS
  Filled 2016-10-10: qty 10

## 2016-10-10 MED ORDER — SODIUM CHLORIDE 0.9 % IV SOLN
15.2000 mg/kg | Freq: Once | INTRAVENOUS | Status: AC
  Administered 2016-10-10: 900 mg via INTRAVENOUS
  Filled 2016-10-10: qty 40

## 2016-10-10 NOTE — Progress Notes (Signed)
OK to treat with ANC 1.3 per Dr Alvy Bimler

## 2016-10-10 NOTE — Patient Instructions (Signed)
[facility] Discharge Instructions for Patients Receiving Chemotherapy  Today you received the following chemotherapy agents Daratumumab  To help prevent nausea and vomiting after your treatment, we encourage you to take your nausea medication as needed   If you develop nausea and vomiting that is not controlled by your nausea medication, call the clinic.   BELOW ARE SYMPTOMS THAT SHOULD BE REPORTED IMMEDIATELY:  *FEVER GREATER THAN 100.5 F  *CHILLS WITH OR WITHOUT FEVER  NAUSEA AND VOMITING THAT IS NOT CONTROLLED WITH YOUR NAUSEA MEDICATION  *UNUSUAL SHORTNESS OF BREATH  *UNUSUAL BRUISING OR BLEEDING  TENDERNESS IN MOUTH AND THROAT WITH OR WITHOUT PRESENCE OF ULCERS  *URINARY PROBLEMS  *BOWEL PROBLEMS  UNUSUAL RASH Items with * indicate a potential emergency and should be followed up as soon as possible.  Feel free to call the clinic you have any questions or concerns. The clinic phone number is [phone].  Please show the [place] at check-in to the Emergency Department and triage nurse.

## 2016-10-10 NOTE — Progress Notes (Signed)
Pt did not want flush nurse to access port. Pt only wants port accessed in infusion. Stated " I may let you do it when I don't have treatment." Nurse was called in infusion and notified of pt request. Carlene Coria LPN

## 2016-10-13 ENCOUNTER — Other Ambulatory Visit: Payer: Self-pay | Admitting: *Deleted

## 2016-10-13 ENCOUNTER — Telehealth: Payer: Self-pay

## 2016-10-13 MED ORDER — POMALYST 2 MG PO CAPS: ORAL_CAPSULE | ORAL | 10 refills | Status: DC

## 2016-10-13 NOTE — Telephone Encounter (Signed)
Pt called for pomalyst refill. She is supposed to start on Friday.

## 2016-10-13 NOTE — Telephone Encounter (Signed)
-

## 2016-10-17 ENCOUNTER — Other Ambulatory Visit (HOSPITAL_BASED_OUTPATIENT_CLINIC_OR_DEPARTMENT_OTHER): Payer: 59

## 2016-10-17 ENCOUNTER — Ambulatory Visit (HOSPITAL_BASED_OUTPATIENT_CLINIC_OR_DEPARTMENT_OTHER): Payer: 59

## 2016-10-17 ENCOUNTER — Encounter: Payer: Self-pay | Admitting: Hematology and Oncology

## 2016-10-17 ENCOUNTER — Ambulatory Visit (HOSPITAL_BASED_OUTPATIENT_CLINIC_OR_DEPARTMENT_OTHER): Payer: 59 | Admitting: Hematology and Oncology

## 2016-10-17 VITALS — BP 121/78 | HR 82 | Temp 98.1°F | Resp 17

## 2016-10-17 DIAGNOSIS — D696 Thrombocytopenia, unspecified: Secondary | ICD-10-CM | POA: Diagnosis not present

## 2016-10-17 DIAGNOSIS — Z5112 Encounter for antineoplastic immunotherapy: Secondary | ICD-10-CM | POA: Diagnosis not present

## 2016-10-17 DIAGNOSIS — C9002 Multiple myeloma in relapse: Secondary | ICD-10-CM

## 2016-10-17 DIAGNOSIS — C9001 Multiple myeloma in remission: Secondary | ICD-10-CM

## 2016-10-17 LAB — CBC WITH DIFFERENTIAL/PLATELET
BASO%: 2.6 % — AB (ref 0.0–2.0)
Basophils Absolute: 0.1 10*3/uL (ref 0.0–0.1)
EOS%: 5.9 % (ref 0.0–7.0)
Eosinophils Absolute: 0.2 10*3/uL (ref 0.0–0.5)
HCT: 38 % (ref 34.8–46.6)
HGB: 12.8 g/dL (ref 11.6–15.9)
LYMPH%: 36.8 % (ref 14.0–49.7)
MCH: 33.8 pg (ref 25.1–34.0)
MCHC: 33.7 g/dL (ref 31.5–36.0)
MCV: 100.3 fL (ref 79.5–101.0)
MONO#: 0.7 10*3/uL (ref 0.1–0.9)
MONO%: 16.4 % — AB (ref 0.0–14.0)
NEUT%: 38.3 % — AB (ref 38.4–76.8)
NEUTROS ABS: 1.6 10*3/uL (ref 1.5–6.5)
PLATELETS: 134 10*3/uL — AB (ref 145–400)
RBC: 3.79 10*6/uL (ref 3.70–5.45)
RDW: 14.5 % (ref 11.2–14.5)
WBC: 4.2 10*3/uL (ref 3.9–10.3)
lymph#: 1.5 10*3/uL (ref 0.9–3.3)

## 2016-10-17 LAB — COMPREHENSIVE METABOLIC PANEL
ALK PHOS: 52 U/L (ref 40–150)
ALT: 16 U/L (ref 0–55)
ANION GAP: 8 meq/L (ref 3–11)
AST: 18 U/L (ref 5–34)
Albumin: 3.8 g/dL (ref 3.5–5.0)
BUN: 20.4 mg/dL (ref 7.0–26.0)
CHLORIDE: 104 meq/L (ref 98–109)
CO2: 29 meq/L (ref 22–29)
CREATININE: 0.8 mg/dL (ref 0.6–1.1)
Calcium: 9.2 mg/dL (ref 8.4–10.4)
EGFR: 81 mL/min/{1.73_m2} — ABNORMAL LOW (ref >90)
GLUCOSE: 92 mg/dL (ref 70–140)
Potassium: 3.7 mEq/L (ref 3.5–5.1)
SODIUM: 141 meq/L (ref 136–145)
TOTAL PROTEIN: 5.8 g/dL — AB (ref 6.4–8.3)
Total Bilirubin: 0.44 mg/dL (ref 0.20–1.20)

## 2016-10-17 MED ORDER — PROCHLORPERAZINE MALEATE 10 MG PO TABS
10.0000 mg | ORAL_TABLET | Freq: Once | ORAL | Status: AC
  Administered 2016-10-17: 10 mg via ORAL

## 2016-10-17 MED ORDER — DIPHENHYDRAMINE HCL 25 MG PO CAPS
ORAL_CAPSULE | ORAL | Status: AC
  Filled 2016-10-17: qty 2

## 2016-10-17 MED ORDER — SODIUM CHLORIDE 0.9 % IV SOLN
Freq: Once | INTRAVENOUS | Status: AC
  Administered 2016-10-17: 11:00:00 via INTRAVENOUS

## 2016-10-17 MED ORDER — ACETAMINOPHEN 325 MG PO TABS
650.0000 mg | ORAL_TABLET | Freq: Once | ORAL | Status: AC
  Administered 2016-10-17: 650 mg via ORAL

## 2016-10-17 MED ORDER — ACETAMINOPHEN 325 MG PO TABS
ORAL_TABLET | ORAL | Status: AC
  Filled 2016-10-17: qty 2

## 2016-10-17 MED ORDER — SODIUM CHLORIDE 0.9 % IV SOLN
15.2000 mg/kg | Freq: Once | INTRAVENOUS | Status: AC
  Administered 2016-10-17: 900 mg via INTRAVENOUS
  Filled 2016-10-17: qty 40

## 2016-10-17 MED ORDER — SODIUM CHLORIDE 0.9% FLUSH
10.0000 mL | INTRAVENOUS | Status: DC | PRN
  Administered 2016-10-17: 10 mL
  Filled 2016-10-17: qty 10

## 2016-10-17 MED ORDER — HEPARIN SOD (PORK) LOCK FLUSH 100 UNIT/ML IV SOLN
500.0000 [IU] | Freq: Once | INTRAVENOUS | Status: AC | PRN
  Administered 2016-10-17: 500 [IU]
  Filled 2016-10-17: qty 5

## 2016-10-17 MED ORDER — DEXAMETHASONE SODIUM PHOSPHATE 100 MG/10ML IJ SOLN
20.0000 mg | Freq: Once | INTRAMUSCULAR | Status: AC
  Administered 2016-10-17: 20 mg via INTRAVENOUS
  Filled 2016-10-17: qty 2

## 2016-10-17 MED ORDER — DIPHENHYDRAMINE HCL 25 MG PO CAPS
50.0000 mg | ORAL_CAPSULE | Freq: Once | ORAL | Status: AC
Start: 2016-10-17 — End: 2016-10-17
  Administered 2016-10-17: 50 mg via ORAL

## 2016-10-17 MED ORDER — PROCHLORPERAZINE MALEATE 10 MG PO TABS
ORAL_TABLET | ORAL | Status: AC
  Filled 2016-10-17: qty 1

## 2016-10-17 NOTE — Patient Instructions (Signed)
[facility] Discharge Instructions for Patients Receiving Chemotherapy  Today you received the following chemotherapy agents Darzalex.  To help prevent nausea and vomiting after your treatment, we encourage you to take your nausea medication as directed.  If you develop nausea and vomiting that is not controlled by your nausea medication, call the clinic.   BELOW ARE SYMPTOMS THAT SHOULD BE REPORTED IMMEDIATELY:  *FEVER GREATER THAN 100.5 F  *CHILLS WITH OR WITHOUT FEVER  NAUSEA AND VOMITING THAT IS NOT CONTROLLED WITH YOUR NAUSEA MEDICATION  *UNUSUAL SHORTNESS OF BREATH  *UNUSUAL BRUISING OR BLEEDING  TENDERNESS IN MOUTH AND THROAT WITH OR WITHOUT PRESENCE OF ULCERS  *URINARY PROBLEMS  *BOWEL PROBLEMS  UNUSUAL RASH Items with * indicate a potential emergency and should be followed up as soon as possible.  Feel free to call the clinic you have any questions or concerns. The clinic phone number is [phone].  Please show the CHEMO ALERT CARD at check-in to the Emergency Department and triage nurse.    

## 2016-10-17 NOTE — Progress Notes (Signed)
[place] OFFICE PROGRESS NOTE  Patient Care Team: [name], MD as PCP - General [name], MD as Consulting Physician (Oncology) [name], MD as Consulting Physician (Neurosurgery) [name], MD as Consulting Physician (Hematology and Oncology) [name], [name], MD as Referring Physician (Hematology and Oncology)  SUMMARY OF ONCOLOGIC HISTORY:   Multiple myeloma in relapse [facility])   07/21/2007 Bone Marrow Biopsy    Case #: [id number] Bone marrow showed myeloma      07/21/2007 Miscellaneous    She was diagnosed in May 2009. Had several cycles of RVD      10/14/2007 Bone Marrow Biopsy    Case #: [id number] repeat bone marrow biopsy showed good response to Rx      12/31/2007 Bone Marrow Transplant    She had autologous BMT at [facility]      07/19/2009 Bone Marrow Biopsy    Case #: [id number] Bone marrow biopsy showed only 1% plasma cells      10/10/2010 Bone Marrow Biopsy    [id number] Bone marrow biopsy showed 2% plasma cells      09/25/2011 Bone Marrow Biopsy    Accession #: [id number] Bone marrow only showed 4% plasma cells with ligh chain excess      10/14/2011 - 06/13/2013 Chemotherapy    She received Revlimid only, discontinued due to pancytopenia      06/18/2015 - 09/13/2015 Chemotherapy    She resumed taking Revlimid and dexamethasone. Treatment is stopped due to minimum response and pancytopenia      10/19/2015 -  Chemotherapy    The patient had Velcade for chemotherapy treatment along with weekly Daratumumab. Last dose of Velcade on 02/29/16, stopped due to progression. Pomalidomide added on 03/14/16      04/11/2016 Adverse Reaction    Delay resumption of Pomalyst cycle 2 until 04/16/16 due to neutropenia      09/16/2016 PET scan    1. No findings of active bony lymphoma. Prior activity in the sternum and thoracic spine has resolved. 2. Focal hypermetabolic activity along the sigmoid colon with some local inflammatory stranding  in the adjacent mesentery, maximum SUV 11.0. This is probably from mild acute diverticulitis. There is no hypermetabolic activity in this vicinity 4 months ago to suggest that this is from colon cancer. Correlate with any symptoms. 3. Acute right maxillary sinusitis. 4. Thoracolumbar compression fractures, vertebral augmentation at the L1 level.      09/17/2016 Procedure    CT guided bone marrow biopsy of right posterior iliac bone with both aspirate and core samples obtained.      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 - TRILINEAGE HEMATOPOIESIS. - SEE COMMENT. PERIPHERAL BLOOD: - LYMPHOPENIA. - THROMBOCYTOPENIA. Diagnosis Note The bone marrow is slightly hypocellular for age with trilineage hematopoiesis and non-specific myeloid changes likely related to previous therapy. Plasma cells are increased in number representing 8% of all cells in the aspirate, but with lack of large aggregates or sheets in the clot and biopsy sections. Immunohistochemical stains highlight the slightly increased plasma cell component in the marrow which shows kappa light chain restriction consistent with residual/recurrent plasma cell neoplasm. Correlation with cytogenetic and FISH studies recommended      09/17/2016 Pathology Results    Normal bone marrow cytogenetics and FISH       INTERVAL HISTORY: Please see below for problem oriented charting. She returns for further  follow-up She feels well Denies bone pain No recent infection The patient denies any recent signs or symptoms of bleeding such as spontaneous epistaxis, hematuria or hematochezia.   REVIEW OF SYSTEMS:   Constitutional: Denies fevers, chills or abnormal weight loss Eyes: Denies blurriness of vision Ears, nose, mouth, throat, and face: Denies mucositis or sore throat Respiratory: Denies cough, dyspnea or  wheezes Cardiovascular: Denies palpitation, chest discomfort or lower extremity swelling Gastrointestinal:  Denies nausea, heartburn or change in bowel habits Skin: Denies abnormal skin rashes Lymphatics: Denies new lymphadenopathy or easy bruising Neurological:Denies numbness, tingling or new weaknesses Behavioral/Psych: Mood is stable, no new changes  All other systems were reviewed with the patient and are negative.  I have reviewed the past medical history, past surgical history, social history and family history with the patient and they are unchanged from previous note.  ALLERGIES:  is allergic to hydromorphone.  MEDICATIONS:  Current Outpatient Prescriptions  Medication Sig Dispense Refill  . acyclovir (ZOVIRAX) 400 MG tablet Take 1 tablet (400 mg total) by mouth 2 (two) times daily. 60 tablet 11  . aspirin 81 MG tablet Take 81 mg by mouth.    . cholecalciferol (VITAMIN D) 1000 UNITS tablet Take 1,000 Units by mouth daily.    Marland Kitchen dexamethasone (DECADRON) 4 MG tablet Take 5 tabs once a week with breakfast every Tuesdays 90 tablet 1  . Multiple Vitamins-Minerals (ONE-A-DAY 50 PLUS PO) Take by mouth daily.    Marland Kitchen oxycodone (OXY-IR) 5 MG capsule Take 5 mg by mouth every 4 (four) hours as needed. pain    . Oxycodone HCl (OXYCONTIN) 60 MG TB12 Take 1 tablet by mouth 2 (two) times daily.    . pantoprazole (PROTONIX) 40 MG tablet Take 1 tablet (40 mg total) by mouth daily. 30 tablet 9  . POMALYST 2 MG capsule Take 1 tablet (25m) by mouth daily. Take on days 1-21. Repeat every 28 days 21 capsule 10   No current facility-administered medications for this visit.    Facility-Administered Medications Ordered in Other Visits  Medication Dose Route Frequency Provider Last Rate Last Dose  . sodium chloride flush (NS) 0.9 % injection 10 mL  10 mL Intracatheter PRN GAlvy Bimler Velena Keegan, MD   10 mL at 10/17/16 1557    PHYSICAL EXAMINATION: ECOG PERFORMANCE STATUS: 1 - Symptomatic but completely  ambulatory  There were no vitals filed for this visit. Filed Weights    GENERAL:alert, no distress and comfortable SKIN: skin color, texture, turgor are normal, no rashes or significant lesions EYES: normal, Conjunctiva are pink and non-injected, sclera clear OROPHARYNX:no exudate, no erythema and lips, buccal mucosa, and tongue normal  NECK: supple, thyroid normal size, non-tender, without nodularity LYMPH:  no palpable lymphadenopathy in the cervical, axillary or inguinal LUNGS: clear to auscultation and percussion with normal breathing effort HEART: regular rate & rhythm and no murmurs and no lower extremity edema ABDOMEN:abdomen soft, non-tender and normal bowel sounds Musculoskeletal:no cyanosis of digits and no clubbing  NEURO: alert & oriented x 3 with fluent speech, no focal motor/sensory deficits  LABORATORY DATA:  I have reviewed the data as listed    Component Value Date/Time   NA 141 10/17/2016 0855   K 3.7 10/17/2016 0855   CL 107 08/30/2012 1055   CO2 29 10/17/2016 0855   GLUCOSE 92 10/17/2016 0855   GLUCOSE 102 (H) 08/30/2012 1055   BUN 20.4 10/17/2016 0855   CREATININE 0.8 10/17/2016 0855   CALCIUM 9.2 10/17/2016 0855   PROT 5.8 (  L) 10/17/2016 0855   ALBUMIN 3.8 10/17/2016 0855   AST 18 10/17/2016 0855   ALT 16 10/17/2016 0855   ALKPHOS 52 10/17/2016 0855   BILITOT 0.44 10/17/2016 0855    No results found for: SPEP, UPEP  Lab Results  Component Value Date   WBC 4.2 10/17/2016   NEUTROABS 1.6 10/17/2016   HGB 12.8 10/17/2016   HCT 38.0 10/17/2016   MCV 100.3 10/17/2016   PLT 134 (L) 10/17/2016      Chemistry      Component Value Date/Time   NA 141 10/17/2016 0855   K 3.7 10/17/2016 0855   CL 107 08/30/2012 1055   CO2 29 10/17/2016 0855   BUN 20.4 10/17/2016 0855   CREATININE 0.8 10/17/2016 0855      Component Value Date/Time   CALCIUM 9.2 10/17/2016 0855   ALKPHOS 52 10/17/2016 0855   AST 18 10/17/2016 0855   ALT 16 10/17/2016 0855    BILITOT 0.44 10/17/2016 0855       ASSESSMENT & PLAN:  Multiple myeloma in relapse ([place]) She tolerated treatment well without any side effects We will continue treatment without dose adjustment She is reminded to take antimicrobial prophylaxis and aspirin for DVT prophylaxis She is on calcium with vitamin D and she gets Zometa every 6 months, next due in September She will continue high-dose steroid once a week Repeat myeloma panel is pending I will call her once test results is available   Thrombocytopenia The cause is unknown. It is mild and there is little change compared from previous platelet count. The patient denies recent history of bleeding such as epistaxis, hematuria or hematochezia. She is asymptomatic from the thrombocytopenia. I will observe for now.    No orders of the defined types were placed in this encounter.  All questions were answered. The patient knows to call the clinic with any problems, questions or concerns. No barriers to learning was detected. I spent 15 minutes counseling the patient face to face. The total time spent in the appointment was 20 minutes and more than 50% was on counseling and review of test results     [name], MD 10/17/2016 6:23 PM

## 2016-10-17 NOTE — Assessment & Plan Note (Signed)
She tolerated treatment well without any side effects We will continue treatment without dose adjustment She is reminded to take antimicrobial prophylaxis and aspirin for DVT prophylaxis She is on calcium with vitamin D and she gets Zometa every 6 months, next due in September She will continue high-dose steroid once a week Repeat myeloma panel is pending I will call her once test results is available

## 2016-10-17 NOTE — Assessment & Plan Note (Signed)
The cause is unknown. It is mild and there is little change compared from previous platelet count. The patient denies recent history of bleeding such as epistaxis, hematuria or hematochezia. She is asymptomatic from the thrombocytopenia. I will observe for now.  

## 2016-10-20 ENCOUNTER — Telehealth: Payer: Self-pay

## 2016-10-20 LAB — MULTIPLE MYELOMA PANEL, SERUM
ALBUMIN SERPL ELPH-MCNC: 3.6 g/dL (ref 2.9–4.4)
ALPHA 1: 0.3 g/dL (ref 0.0–0.4)
Albumin/Glob SerPl: 1.9 — ABNORMAL HIGH (ref 0.7–1.7)
Alpha2 Glob SerPl Elph-Mcnc: 0.6 g/dL (ref 0.4–1.0)
B-Globulin SerPl Elph-Mcnc: 0.8 g/dL (ref 0.7–1.3)
GAMMA GLOB SERPL ELPH-MCNC: 0.3 g/dL — AB (ref 0.4–1.8)
GLOBULIN, TOTAL: 2 g/dL — AB (ref 2.2–3.9)
IGA/IMMUNOGLOBULIN A, SERUM: 8 mg/dL — AB (ref 87–352)
IGG (IMMUNOGLOBIN G), SERUM: 323 mg/dL — AB (ref 700–1600)
IgM, Qn, Serum: 11 mg/dL — ABNORMAL LOW (ref 26–217)
M PROTEIN SERPL ELPH-MCNC: 0.1 g/dL — AB
TOTAL PROTEIN: 5.6 g/dL — AB (ref 6.0–8.5)

## 2016-10-20 LAB — KAPPA/LAMBDA LIGHT CHAINS
IG LAMBDA FREE LIGHT CHAIN: 2.9 mg/L — AB (ref 5.7–26.3)
Ig Kappa Free Light Chain: 134.1 mg/L — ABNORMAL HIGH (ref 3.3–19.4)
KAPPA/LAMBDA FLC RATIO: 46.24 — AB (ref 0.26–1.65)

## 2016-10-20 NOTE — Telephone Encounter (Signed)
Called and left a message that new appts have been added and she can get a new printout this week at her 8/17 appt

## 2016-10-21 ENCOUNTER — Telehealth: Payer: Self-pay

## 2016-10-21 NOTE — Telephone Encounter (Signed)
Called with below message, verbalized understanding. 

## 2016-10-21 NOTE — Telephone Encounter (Signed)
-----   Message from [name], MD sent at 10/21/2016  5:09 AM EDT ----- Regarding: labs Pls call her and let her know light chains levels are improving ----- Message ----- From: Interface, Lab In Three Zero One Sent: 10/17/2016   9:49 AM To: [name], MD

## 2016-10-24 ENCOUNTER — Ambulatory Visit (HOSPITAL_BASED_OUTPATIENT_CLINIC_OR_DEPARTMENT_OTHER): Payer: 59

## 2016-10-24 ENCOUNTER — Ambulatory Visit: Payer: 59

## 2016-10-24 ENCOUNTER — Other Ambulatory Visit (HOSPITAL_BASED_OUTPATIENT_CLINIC_OR_DEPARTMENT_OTHER): Payer: 59

## 2016-10-24 VITALS — BP 119/84 | HR 81 | Temp 98.8°F | Resp 18

## 2016-10-24 DIAGNOSIS — C9001 Multiple myeloma in remission: Secondary | ICD-10-CM

## 2016-10-24 DIAGNOSIS — C9002 Multiple myeloma in relapse: Secondary | ICD-10-CM | POA: Diagnosis not present

## 2016-10-24 DIAGNOSIS — Z5112 Encounter for antineoplastic immunotherapy: Secondary | ICD-10-CM | POA: Diagnosis not present

## 2016-10-24 LAB — CBC WITH DIFFERENTIAL/PLATELET
BASO%: 2.2 % — ABNORMAL HIGH (ref 0.0–2.0)
Basophils Absolute: 0.1 10*3/uL (ref 0.0–0.1)
EOS%: 11.4 % — AB (ref 0.0–7.0)
Eosinophils Absolute: 0.6 10*3/uL — ABNORMAL HIGH (ref 0.0–0.5)
HCT: 37.7 % (ref 34.8–46.6)
HGB: 12.4 g/dL (ref 11.6–15.9)
LYMPH#: 1.4 10*3/uL (ref 0.9–3.3)
LYMPH%: 27.8 % (ref 14.0–49.7)
MCH: 33.3 pg (ref 25.1–34.0)
MCHC: 32.9 g/dL (ref 31.5–36.0)
MCV: 101.3 fL — ABNORMAL HIGH (ref 79.5–101.0)
MONO#: 0.5 10*3/uL (ref 0.1–0.9)
MONO%: 9.8 % (ref 0.0–14.0)
NEUT%: 48.8 % (ref 38.4–76.8)
NEUTROS ABS: 2.5 10*3/uL (ref 1.5–6.5)
NRBC: 0 % (ref 0–0)
Platelets: 154 10*3/uL (ref 145–400)
RBC: 3.72 10*6/uL (ref 3.70–5.45)
RDW: 14.7 % — AB (ref 11.2–14.5)
WBC: 5.1 10*3/uL (ref 3.9–10.3)

## 2016-10-24 LAB — COMPREHENSIVE METABOLIC PANEL
ALK PHOS: 51 U/L (ref 40–150)
ALT: 14 U/L (ref 0–55)
AST: 19 U/L (ref 5–34)
Albumin: 3.7 g/dL (ref 3.5–5.0)
Anion Gap: 9 mEq/L (ref 3–11)
BUN: 20.7 mg/dL (ref 7.0–26.0)
CO2: 27 meq/L (ref 22–29)
Calcium: 8.9 mg/dL (ref 8.4–10.4)
Chloride: 104 mEq/L (ref 98–109)
Creatinine: 0.8 mg/dL (ref 0.6–1.1)
EGFR: 83 mL/min/{1.73_m2} — AB (ref >90)
GLUCOSE: 83 mg/dL (ref 70–140)
Potassium: 3.8 mEq/L (ref 3.5–5.1)
SODIUM: 139 meq/L (ref 136–145)
TOTAL PROTEIN: 5.7 g/dL — AB (ref 6.4–8.3)
Total Bilirubin: 0.41 mg/dL (ref 0.20–1.20)

## 2016-10-24 MED ORDER — DIPHENHYDRAMINE HCL 25 MG PO CAPS
50.0000 mg | ORAL_CAPSULE | Freq: Once | ORAL | Status: AC
  Administered 2016-10-24: 50 mg via ORAL

## 2016-10-24 MED ORDER — HEPARIN SOD (PORK) LOCK FLUSH 100 UNIT/ML IV SOLN
500.0000 [IU] | Freq: Once | INTRAVENOUS | Status: AC | PRN
  Administered 2016-10-24: 500 [IU]
  Filled 2016-10-24: qty 5

## 2016-10-24 MED ORDER — ACETAMINOPHEN 325 MG PO TABS
ORAL_TABLET | ORAL | Status: AC
  Filled 2016-10-24: qty 2

## 2016-10-24 MED ORDER — SODIUM CHLORIDE 0.9 % IV SOLN
15.3000 mg/kg | Freq: Once | INTRAVENOUS | Status: AC
  Administered 2016-10-24: 900 mg via INTRAVENOUS
  Filled 2016-10-24: qty 40

## 2016-10-24 MED ORDER — DIPHENHYDRAMINE HCL 25 MG PO CAPS
ORAL_CAPSULE | ORAL | Status: AC
  Filled 2016-10-24: qty 2

## 2016-10-24 MED ORDER — SODIUM CHLORIDE 0.9% FLUSH
10.0000 mL | INTRAVENOUS | Status: DC | PRN
  Administered 2016-10-24: 10 mL
  Filled 2016-10-24: qty 10

## 2016-10-24 MED ORDER — PROCHLORPERAZINE MALEATE 10 MG PO TABS
10.0000 mg | ORAL_TABLET | Freq: Once | ORAL | Status: AC
  Administered 2016-10-24: 10 mg via ORAL

## 2016-10-24 MED ORDER — ACETAMINOPHEN 325 MG PO TABS
650.0000 mg | ORAL_TABLET | Freq: Once | ORAL | Status: AC
Start: 2016-10-24 — End: 2016-10-24
  Administered 2016-10-24: 650 mg via ORAL

## 2016-10-24 MED ORDER — PROCHLORPERAZINE MALEATE 10 MG PO TABS
ORAL_TABLET | ORAL | Status: AC
  Filled 2016-10-24: qty 1

## 2016-10-24 MED ORDER — SODIUM CHLORIDE 0.9 % IV SOLN
Freq: Once | INTRAVENOUS | Status: AC
  Administered 2016-10-24: 10:00:00 via INTRAVENOUS

## 2016-10-24 MED ORDER — SODIUM CHLORIDE 0.9 % IV SOLN
20.0000 mg | Freq: Once | INTRAVENOUS | Status: AC
  Administered 2016-10-24: 20 mg via INTRAVENOUS
  Filled 2016-10-24: qty 2

## 2016-10-24 NOTE — Patient Instructions (Signed)
[facility] Discharge Instructions for Patients Receiving Chemotherapy  Today you received the following chemotherapy agents Darzalex.  To help prevent nausea and vomiting after your treatment, we encourage you to take your nausea medication as directed.  If you develop nausea and vomiting that is not controlled by your nausea medication, call the clinic.   BELOW ARE SYMPTOMS THAT SHOULD BE REPORTED IMMEDIATELY:  *FEVER GREATER THAN 100.5 F  *CHILLS WITH OR WITHOUT FEVER  NAUSEA AND VOMITING THAT IS NOT CONTROLLED WITH YOUR NAUSEA MEDICATION  *UNUSUAL SHORTNESS OF BREATH  *UNUSUAL BRUISING OR BLEEDING  TENDERNESS IN MOUTH AND THROAT WITH OR WITHOUT PRESENCE OF ULCERS  *URINARY PROBLEMS  *BOWEL PROBLEMS  UNUSUAL RASH Items with * indicate a potential emergency and should be followed up as soon as possible.  Feel free to call the clinic you have any questions or concerns. The clinic phone number is [phone].  Please show the CHEMO ALERT CARD at check-in to the Emergency Department and triage nurse.    

## 2016-10-24 NOTE — Progress Notes (Signed)
Pt in infusion chair for labs.

## 2016-10-31 ENCOUNTER — Other Ambulatory Visit (HOSPITAL_BASED_OUTPATIENT_CLINIC_OR_DEPARTMENT_OTHER): Payer: 59

## 2016-10-31 ENCOUNTER — Other Ambulatory Visit: Payer: Self-pay | Admitting: Hematology and Oncology

## 2016-10-31 ENCOUNTER — Ambulatory Visit (HOSPITAL_BASED_OUTPATIENT_CLINIC_OR_DEPARTMENT_OTHER): Payer: 59

## 2016-10-31 ENCOUNTER — Ambulatory Visit: Payer: 59

## 2016-10-31 VITALS — BP 112/73 | HR 72 | Temp 98.5°F | Resp 16

## 2016-10-31 DIAGNOSIS — C9002 Multiple myeloma in relapse: Secondary | ICD-10-CM

## 2016-10-31 DIAGNOSIS — Z5112 Encounter for antineoplastic immunotherapy: Secondary | ICD-10-CM

## 2016-10-31 DIAGNOSIS — C9001 Multiple myeloma in remission: Secondary | ICD-10-CM

## 2016-10-31 LAB — COMPREHENSIVE METABOLIC PANEL
ALT: 17 U/L (ref 0–55)
AST: 19 U/L (ref 5–34)
Albumin: 3.8 g/dL (ref 3.5–5.0)
Alkaline Phosphatase: 52 U/L (ref 40–150)
Anion Gap: 7 mEq/L (ref 3–11)
BUN: 19.1 mg/dL (ref 7.0–26.0)
CALCIUM: 9.2 mg/dL (ref 8.4–10.4)
CHLORIDE: 103 meq/L (ref 98–109)
CO2: 28 mEq/L (ref 22–29)
Creatinine: 0.8 mg/dL (ref 0.6–1.1)
EGFR: 85 mL/min/{1.73_m2} — ABNORMAL LOW (ref >90)
Glucose: 88 mg/dl (ref 70–140)
POTASSIUM: 3.8 meq/L (ref 3.5–5.1)
SODIUM: 138 meq/L (ref 136–145)
Total Bilirubin: 0.5 mg/dL (ref 0.20–1.20)
Total Protein: 6.1 g/dL — ABNORMAL LOW (ref 6.4–8.3)

## 2016-10-31 LAB — CBC WITH DIFFERENTIAL/PLATELET
BASO%: 3.3 % — ABNORMAL HIGH (ref 0.0–2.0)
BASOS ABS: 0.2 10*3/uL — AB (ref 0.0–0.1)
EOS%: 10.5 % — AB (ref 0.0–7.0)
Eosinophils Absolute: 0.7 10*3/uL — ABNORMAL HIGH (ref 0.0–0.5)
HEMATOCRIT: 38.8 % (ref 34.8–46.6)
HGB: 13 g/dL (ref 11.6–15.9)
LYMPH%: 20.7 % (ref 14.0–49.7)
MCH: 33.6 pg (ref 25.1–34.0)
MCHC: 33.4 g/dL (ref 31.5–36.0)
MCV: 100.5 fL (ref 79.5–101.0)
MONO#: 1.3 10*3/uL — ABNORMAL HIGH (ref 0.1–0.9)
MONO%: 18.8 % — AB (ref 0.0–14.0)
NEUT#: 3.1 10*3/uL (ref 1.5–6.5)
NEUT%: 46.7 % (ref 38.4–76.8)
Platelets: 179 10*3/uL (ref 145–400)
RBC: 3.86 10*6/uL (ref 3.70–5.45)
RDW: 15.1 % — ABNORMAL HIGH (ref 11.2–14.5)
WBC: 6.7 10*3/uL (ref 3.9–10.3)
lymph#: 1.4 10*3/uL (ref 0.9–3.3)

## 2016-10-31 MED ORDER — SODIUM CHLORIDE 0.9% FLUSH
10.0000 mL | INTRAVENOUS | Status: DC | PRN
  Administered 2016-10-31: 10 mL
  Filled 2016-10-31: qty 10

## 2016-10-31 MED ORDER — SODIUM CHLORIDE 0.9 % IV SOLN
20.0000 mg | Freq: Once | INTRAVENOUS | Status: AC
  Administered 2016-10-31: 20 mg via INTRAVENOUS
  Filled 2016-10-31: qty 2

## 2016-10-31 MED ORDER — DIPHENHYDRAMINE HCL 25 MG PO CAPS
50.0000 mg | ORAL_CAPSULE | Freq: Once | ORAL | Status: AC
  Administered 2016-10-31: 50 mg via ORAL

## 2016-10-31 MED ORDER — SODIUM CHLORIDE 0.9 % IV SOLN
Freq: Once | INTRAVENOUS | Status: AC
  Administered 2016-10-31: 10:00:00 via INTRAVENOUS

## 2016-10-31 MED ORDER — ACETAMINOPHEN 325 MG PO TABS
650.0000 mg | ORAL_TABLET | Freq: Once | ORAL | Status: AC
  Administered 2016-10-31: 650 mg via ORAL

## 2016-10-31 MED ORDER — DIPHENHYDRAMINE HCL 25 MG PO CAPS
ORAL_CAPSULE | ORAL | Status: AC
  Filled 2016-10-31: qty 2

## 2016-10-31 MED ORDER — ACETAMINOPHEN 325 MG PO TABS
ORAL_TABLET | ORAL | Status: AC
  Filled 2016-10-31: qty 2

## 2016-10-31 MED ORDER — DARATUMUMAB CHEMO INJECTION 400 MG/20ML
15.3000 mg/kg | Freq: Once | INTRAVENOUS | Status: AC
  Administered 2016-10-31: 900 mg via INTRAVENOUS
  Filled 2016-10-31: qty 40

## 2016-10-31 MED ORDER — HEPARIN SOD (PORK) LOCK FLUSH 100 UNIT/ML IV SOLN
500.0000 [IU] | Freq: Once | INTRAVENOUS | Status: AC | PRN
  Administered 2016-10-31: 500 [IU]
  Filled 2016-10-31: qty 5

## 2016-10-31 MED ORDER — PROCHLORPERAZINE MALEATE 10 MG PO TABS
10.0000 mg | ORAL_TABLET | Freq: Once | ORAL | Status: AC
  Administered 2016-10-31: 10 mg via ORAL

## 2016-10-31 MED ORDER — PROCHLORPERAZINE MALEATE 10 MG PO TABS
ORAL_TABLET | ORAL | Status: AC
  Filled 2016-10-31: qty 1

## 2016-10-31 MED ORDER — SODIUM CHLORIDE 0.9 % IJ SOLN
10.0000 mL | Freq: Once | INTRAMUSCULAR | Status: DC
  Filled 2016-10-31: qty 10

## 2016-10-31 NOTE — Telephone Encounter (Signed)
Please refill electronically 

## 2016-10-31 NOTE — Patient Instructions (Signed)
[facility] Discharge Instructions for Patients Receiving Chemotherapy  Today you received the following chemotherapy agents:  daratumumab (Darzalex).   To help prevent nausea and vomiting after your treatment, we encourage you to take your nausea medication as directed.   If you develop nausea and vomiting that is not controlled by your nausea medication, call the clinic.   BELOW ARE SYMPTOMS THAT SHOULD BE REPORTED IMMEDIATELY:  *FEVER GREATER THAN 100.5 F  *CHILLS WITH OR WITHOUT FEVER  NAUSEA AND VOMITING THAT IS NOT CONTROLLED WITH YOUR NAUSEA MEDICATION  *UNUSUAL SHORTNESS OF BREATH  *UNUSUAL BRUISING OR BLEEDING  TENDERNESS IN MOUTH AND THROAT WITH OR WITHOUT PRESENCE OF ULCERS  *URINARY PROBLEMS  *BOWEL PROBLEMS  UNUSUAL RASH Items with * indicate a potential emergency and should be followed up as soon as possible.  Feel free to call the clinic you have any questions or concerns. The clinic phone number is [phone].  Please show the CHEMO ALERT CARD at check-in to the Emergency Department and triage nurse.   

## 2016-10-31 NOTE — Progress Notes (Signed)
Patient refused to have labs drawn in the flush room today. Patient stated,"a nurse told me that I can have labs drawn in the infusion room and skip the flush room."

## 2016-10-31 NOTE — Telephone Encounter (Signed)
-

## 2016-11-07 ENCOUNTER — Ambulatory Visit (HOSPITAL_BASED_OUTPATIENT_CLINIC_OR_DEPARTMENT_OTHER): Payer: 59

## 2016-11-07 ENCOUNTER — Ambulatory Visit: Payer: 59

## 2016-11-07 ENCOUNTER — Other Ambulatory Visit (HOSPITAL_BASED_OUTPATIENT_CLINIC_OR_DEPARTMENT_OTHER): Payer: 59

## 2016-11-07 VITALS — BP 114/68 | HR 71 | Temp 97.9°F | Resp 16

## 2016-11-07 DIAGNOSIS — Z5112 Encounter for antineoplastic immunotherapy: Secondary | ICD-10-CM | POA: Diagnosis not present

## 2016-11-07 DIAGNOSIS — Z95828 Presence of other vascular implants and grafts: Secondary | ICD-10-CM

## 2016-11-07 DIAGNOSIS — C9002 Multiple myeloma in relapse: Secondary | ICD-10-CM

## 2016-11-07 DIAGNOSIS — C9001 Multiple myeloma in remission: Secondary | ICD-10-CM

## 2016-11-07 LAB — COMPREHENSIVE METABOLIC PANEL
ALBUMIN: 3.8 g/dL (ref 3.5–5.0)
ALK PHOS: 50 U/L (ref 40–150)
ALT: 14 U/L (ref 0–55)
AST: 18 U/L (ref 5–34)
Anion Gap: 7 mEq/L (ref 3–11)
BILIRUBIN TOTAL: 0.55 mg/dL (ref 0.20–1.20)
BUN: 15 mg/dL (ref 7.0–26.0)
CO2: 26 meq/L (ref 22–29)
Calcium: 8.9 mg/dL (ref 8.4–10.4)
Chloride: 106 mEq/L (ref 98–109)
Creatinine: 0.8 mg/dL (ref 0.6–1.1)
EGFR: 83 mL/min/{1.73_m2} — ABNORMAL LOW (ref >90)
GLUCOSE: 88 mg/dL (ref 70–140)
Potassium: 3.6 mEq/L (ref 3.5–5.1)
SODIUM: 140 meq/L (ref 136–145)
TOTAL PROTEIN: 6 g/dL — AB (ref 6.4–8.3)

## 2016-11-07 LAB — CBC WITH DIFFERENTIAL/PLATELET
BASO%: 1.7 % (ref 0.0–2.0)
Basophils Absolute: 0.1 10*3/uL (ref 0.0–0.1)
EOS ABS: 1.1 10*3/uL — AB (ref 0.0–0.5)
EOS%: 21.8 % — AB (ref 0.0–7.0)
HCT: 38.2 % (ref 34.8–46.6)
HEMOGLOBIN: 12.7 g/dL (ref 11.6–15.9)
LYMPH%: 27 % (ref 14.0–49.7)
MCH: 33.5 pg (ref 25.1–34.0)
MCHC: 33.2 g/dL (ref 31.5–36.0)
MCV: 100.8 fL (ref 79.5–101.0)
MONO#: 1 10*3/uL — ABNORMAL HIGH (ref 0.1–0.9)
MONO%: 19.8 % — ABNORMAL HIGH (ref 0.0–14.0)
NEUT%: 29.7 % — ABNORMAL LOW (ref 38.4–76.8)
NEUTROS ABS: 1.5 10*3/uL (ref 1.5–6.5)
Platelets: 108 10*3/uL — ABNORMAL LOW (ref 145–400)
RBC: 3.79 10*6/uL (ref 3.70–5.45)
RDW: 15.2 % — AB (ref 11.2–14.5)
WBC: 4.9 10*3/uL (ref 3.9–10.3)
lymph#: 1.3 10*3/uL (ref 0.9–3.3)

## 2016-11-07 MED ORDER — SODIUM CHLORIDE 0.9% FLUSH
10.0000 mL | INTRAVENOUS | Status: DC | PRN
  Administered 2016-11-07: 10 mL
  Filled 2016-11-07: qty 10

## 2016-11-07 MED ORDER — ACETAMINOPHEN 325 MG PO TABS
ORAL_TABLET | ORAL | Status: AC
  Filled 2016-11-07: qty 2

## 2016-11-07 MED ORDER — ACETAMINOPHEN 325 MG PO TABS
650.0000 mg | ORAL_TABLET | Freq: Once | ORAL | Status: AC
Start: 2016-11-07 — End: 2016-11-07
  Administered 2016-11-07: 650 mg via ORAL

## 2016-11-07 MED ORDER — DIPHENHYDRAMINE HCL 25 MG PO CAPS
50.0000 mg | ORAL_CAPSULE | Freq: Once | ORAL | Status: AC
  Administered 2016-11-07: 50 mg via ORAL

## 2016-11-07 MED ORDER — SODIUM CHLORIDE 0.9 % IJ SOLN
10.0000 mL | INTRAMUSCULAR | Status: DC | PRN
  Filled 2016-11-07: qty 10

## 2016-11-07 MED ORDER — PROCHLORPERAZINE MALEATE 10 MG PO TABS
10.0000 mg | ORAL_TABLET | Freq: Once | ORAL | Status: AC
  Administered 2016-11-07: 10 mg via ORAL

## 2016-11-07 MED ORDER — SODIUM CHLORIDE 0.9 % IV SOLN
15.2000 mg/kg | Freq: Once | INTRAVENOUS | Status: AC
  Administered 2016-11-07: 900 mg via INTRAVENOUS
  Filled 2016-11-07: qty 40

## 2016-11-07 MED ORDER — PROCHLORPERAZINE MALEATE 10 MG PO TABS
ORAL_TABLET | ORAL | Status: AC
  Filled 2016-11-07: qty 1

## 2016-11-07 MED ORDER — SODIUM CHLORIDE 0.9 % IV SOLN
Freq: Once | INTRAVENOUS | Status: AC
  Administered 2016-11-07: 10:00:00 via INTRAVENOUS

## 2016-11-07 MED ORDER — HEPARIN SOD (PORK) LOCK FLUSH 100 UNIT/ML IV SOLN
500.0000 [IU] | Freq: Once | INTRAVENOUS | Status: AC | PRN
  Administered 2016-11-07: 500 [IU]
  Filled 2016-11-07: qty 5

## 2016-11-07 MED ORDER — DIPHENHYDRAMINE HCL 25 MG PO CAPS
ORAL_CAPSULE | ORAL | Status: AC
  Filled 2016-11-07: qty 2

## 2016-11-07 MED ORDER — SODIUM CHLORIDE 0.9 % IV SOLN
20.0000 mg | Freq: Once | INTRAVENOUS | Status: AC
  Administered 2016-11-07: 20 mg via INTRAVENOUS
  Filled 2016-11-07: qty 2

## 2016-11-07 NOTE — Patient Instructions (Signed)
[facility] Discharge Instructions for Patients Receiving Chemotherapy  Today you received the following chemotherapy agents darzalex  To help prevent nausea and vomiting after your treatment, we encourage you to take your nausea medication as directed  If you develop nausea and vomiting that is not controlled by your nausea medication, call the clinic.   BELOW ARE SYMPTOMS THAT SHOULD BE REPORTED IMMEDIATELY:  *FEVER GREATER THAN 100.5 F  *CHILLS WITH OR WITHOUT FEVER  NAUSEA AND VOMITING THAT IS NOT CONTROLLED WITH YOUR NAUSEA MEDICATION  *UNUSUAL SHORTNESS OF BREATH  *UNUSUAL BRUISING OR BLEEDING  TENDERNESS IN MOUTH AND THROAT WITH OR WITHOUT PRESENCE OF ULCERS  *URINARY PROBLEMS  *BOWEL PROBLEMS  UNUSUAL RASH Items with * indicate a potential emergency and should be followed up as soon as possible.  Feel free to call the clinic you have any questions or concerns. The clinic phone number is [phone].  

## 2016-11-07 NOTE — Progress Notes (Signed)
Port will be accessed in infusion. Called charge nurse (dianne). Stated to send pt back to infusion. Pt does not want labs drawn in any flush room. Only in infusion. Per pt. Jessica Seckel LPN

## 2016-11-14 ENCOUNTER — Ambulatory Visit (HOSPITAL_BASED_OUTPATIENT_CLINIC_OR_DEPARTMENT_OTHER): Payer: 59 | Admitting: Hematology and Oncology

## 2016-11-14 ENCOUNTER — Encounter: Payer: Self-pay | Admitting: Hematology and Oncology

## 2016-11-14 ENCOUNTER — Other Ambulatory Visit (HOSPITAL_BASED_OUTPATIENT_CLINIC_OR_DEPARTMENT_OTHER): Payer: 59

## 2016-11-14 ENCOUNTER — Telehealth: Payer: Self-pay

## 2016-11-14 ENCOUNTER — Ambulatory Visit: Payer: 59

## 2016-11-14 ENCOUNTER — Ambulatory Visit (HOSPITAL_BASED_OUTPATIENT_CLINIC_OR_DEPARTMENT_OTHER): Payer: 59

## 2016-11-14 VITALS — BP 110/70 | HR 63 | Temp 98.3°F | Resp 16

## 2016-11-14 DIAGNOSIS — Z5112 Encounter for antineoplastic immunotherapy: Secondary | ICD-10-CM | POA: Diagnosis not present

## 2016-11-14 DIAGNOSIS — D696 Thrombocytopenia, unspecified: Secondary | ICD-10-CM

## 2016-11-14 DIAGNOSIS — C9002 Multiple myeloma in relapse: Secondary | ICD-10-CM

## 2016-11-14 DIAGNOSIS — C9001 Multiple myeloma in remission: Secondary | ICD-10-CM

## 2016-11-14 DIAGNOSIS — Z95828 Presence of other vascular implants and grafts: Secondary | ICD-10-CM

## 2016-11-14 LAB — COMPREHENSIVE METABOLIC PANEL
ALBUMIN: 3.7 g/dL (ref 3.5–5.0)
ALK PHOS: 50 U/L (ref 40–150)
ALT: 41 U/L (ref 0–55)
ANION GAP: 9 meq/L (ref 3–11)
AST: 20 U/L (ref 5–34)
BILIRUBIN TOTAL: 0.46 mg/dL (ref 0.20–1.20)
BUN: 22.3 mg/dL (ref 7.0–26.0)
CALCIUM: 9.4 mg/dL (ref 8.4–10.4)
CO2: 26 mEq/L (ref 22–29)
CREATININE: 0.8 mg/dL (ref 0.6–1.1)
Chloride: 105 mEq/L (ref 98–109)
EGFR: 82 mL/min/{1.73_m2} — ABNORMAL LOW (ref >90)
Glucose: 85 mg/dl (ref 70–140)
Potassium: 3.6 mEq/L (ref 3.5–5.1)
Sodium: 140 mEq/L (ref 136–145)
TOTAL PROTEIN: 6.1 g/dL — AB (ref 6.4–8.3)

## 2016-11-14 LAB — CBC WITH DIFFERENTIAL/PLATELET
BASO%: 0.9 % (ref 0.0–2.0)
Basophils Absolute: 0.1 10*3/uL (ref 0.0–0.1)
EOS%: 1.9 % (ref 0.0–7.0)
Eosinophils Absolute: 0.1 10*3/uL (ref 0.0–0.5)
HEMATOCRIT: 37 % (ref 34.8–46.6)
HEMOGLOBIN: 12.4 g/dL (ref 11.6–15.9)
LYMPH#: 1.5 10*3/uL (ref 0.9–3.3)
LYMPH%: 25.9 % (ref 14.0–49.7)
MCH: 33.6 pg (ref 25.1–34.0)
MCHC: 33.5 g/dL (ref 31.5–36.0)
MCV: 100.3 fL (ref 79.5–101.0)
MONO#: 1.2 10*3/uL — AB (ref 0.1–0.9)
MONO%: 20 % — ABNORMAL HIGH (ref 0.0–14.0)
NEUT%: 51.3 % (ref 38.4–76.8)
NEUTROS ABS: 3 10*3/uL (ref 1.5–6.5)
PLATELETS: 136 10*3/uL — AB (ref 145–400)
RBC: 3.69 10*6/uL — ABNORMAL LOW (ref 3.70–5.45)
RDW: 15.3 % — AB (ref 11.2–14.5)
WBC: 5.9 10*3/uL (ref 3.9–10.3)

## 2016-11-14 MED ORDER — PROCHLORPERAZINE MALEATE 10 MG PO TABS
ORAL_TABLET | ORAL | Status: AC
  Filled 2016-11-14: qty 1

## 2016-11-14 MED ORDER — HEPARIN SOD (PORK) LOCK FLUSH 100 UNIT/ML IV SOLN
500.0000 [IU] | Freq: Once | INTRAVENOUS | Status: AC | PRN
  Administered 2016-11-14: 500 [IU]
  Filled 2016-11-14: qty 5

## 2016-11-14 MED ORDER — ACETAMINOPHEN 325 MG PO TABS
650.0000 mg | ORAL_TABLET | Freq: Once | ORAL | Status: AC
  Administered 2016-11-14: 650 mg via ORAL

## 2016-11-14 MED ORDER — SODIUM CHLORIDE 0.9 % IV SOLN
15.3000 mg/kg | Freq: Once | INTRAVENOUS | Status: AC
  Administered 2016-11-14: 900 mg via INTRAVENOUS
  Filled 2016-11-14: qty 40

## 2016-11-14 MED ORDER — SODIUM CHLORIDE 0.9% FLUSH
10.0000 mL | INTRAVENOUS | Status: DC | PRN
  Administered 2016-11-14: 10 mL
  Filled 2016-11-14: qty 10

## 2016-11-14 MED ORDER — SODIUM CHLORIDE 0.9 % IV SOLN
20.0000 mg | Freq: Once | INTRAVENOUS | Status: AC
  Administered 2016-11-14: 20 mg via INTRAVENOUS
  Filled 2016-11-14: qty 2

## 2016-11-14 MED ORDER — SODIUM CHLORIDE 0.9 % IJ SOLN
10.0000 mL | INTRAMUSCULAR | Status: DC | PRN
  Administered 2016-11-14: 10 mL via INTRAVENOUS
  Filled 2016-11-14: qty 10

## 2016-11-14 MED ORDER — ZOLEDRONIC ACID 4 MG/5ML IV CONC
4.0000 mg | Freq: Once | INTRAVENOUS | Status: DC
  Filled 2016-11-14: qty 5

## 2016-11-14 MED ORDER — ZOLEDRONIC ACID 4 MG/100ML IV SOLN
4.0000 mg | Freq: Once | INTRAVENOUS | Status: DC
  Filled 2016-11-14: qty 100

## 2016-11-14 MED ORDER — SODIUM CHLORIDE 0.9 % IV SOLN
Freq: Once | INTRAVENOUS | Status: AC
  Administered 2016-11-14: 09:00:00 via INTRAVENOUS

## 2016-11-14 MED ORDER — DIPHENHYDRAMINE HCL 25 MG PO CAPS
50.0000 mg | ORAL_CAPSULE | Freq: Once | ORAL | Status: AC
  Administered 2016-11-14: 50 mg via ORAL

## 2016-11-14 MED ORDER — DIPHENHYDRAMINE HCL 25 MG PO CAPS
ORAL_CAPSULE | ORAL | Status: AC
  Filled 2016-11-14: qty 2

## 2016-11-14 MED ORDER — PROCHLORPERAZINE MALEATE 10 MG PO TABS
10.0000 mg | ORAL_TABLET | Freq: Once | ORAL | Status: AC
  Administered 2016-11-14: 10 mg via ORAL

## 2016-11-14 MED ORDER — ACETAMINOPHEN 325 MG PO TABS
ORAL_TABLET | ORAL | Status: AC
  Filled 2016-11-14: qty 2

## 2016-11-14 NOTE — Patient Instructions (Signed)
[facility] Discharge Instructions for Patients Receiving Chemotherapy  Today you received the following chemotherapy agents:  daratumumab (Darzalex).   To help prevent nausea and vomiting after your treatment, we encourage you to take your nausea medication as directed.   If you develop nausea and vomiting that is not controlled by your nausea medication, call the clinic.   BELOW ARE SYMPTOMS THAT SHOULD BE REPORTED IMMEDIATELY:  *FEVER GREATER THAN 100.5 F  *CHILLS WITH OR WITHOUT FEVER  NAUSEA AND VOMITING THAT IS NOT CONTROLLED WITH YOUR NAUSEA MEDICATION  *UNUSUAL SHORTNESS OF BREATH  *UNUSUAL BRUISING OR BLEEDING  TENDERNESS IN MOUTH AND THROAT WITH OR WITHOUT PRESENCE OF ULCERS  *URINARY PROBLEMS  *BOWEL PROBLEMS  UNUSUAL RASH Items with * indicate a potential emergency and should be followed up as soon as possible.  Feel free to call the clinic you have any questions or concerns. The clinic phone number is [phone].  Please show the CHEMO ALERT CARD at check-in to the Emergency Department and triage nurse.   

## 2016-11-14 NOTE — Telephone Encounter (Signed)
Printed avs and calender for upcoming appointments for October. Per 9/7 los

## 2016-11-14 NOTE — Patient Instructions (Signed)
Implanted Port Home Guide An implanted port is a type of central line that is placed under the skin. Central lines are used to provide IV access when treatment or nutrition needs to be given through a person's veins. Implanted ports are used for long-term IV access. An implanted port may be placed because:  You need IV medicine that would be irritating to the small veins in your hands or arms.  You need long-term IV medicines, such as antibiotics.  You need IV nutrition for a long period.  You need frequent blood draws for lab tests.  You need dialysis.  Implanted ports are usually placed in the chest area, but they can also be placed in the upper arm, the abdomen, or the leg. An implanted port has two main parts:  Reservoir. The reservoir is round and will appear as a small, raised area under your skin. The reservoir is the part where a needle is inserted to give medicines or draw blood.  Catheter. The catheter is a thin, flexible tube that extends from the reservoir. The catheter is placed into a large vein. Medicine that is inserted into the reservoir goes into the catheter and then into the vein.  How will I care for my incision site? Do not get the incision site wet. Bathe or shower as directed by your health care provider. How is my port accessed? Special steps must be taken to access the port:  Before the port is accessed, a numbing cream can be placed on the skin. This helps numb the skin over the port site.  Your health care provider uses a sterile technique to access the port. ? Your health care provider must put on a mask and sterile gloves. ? The skin over your port is cleaned carefully with an antiseptic and allowed to dry. ? The port is gently pinched between sterile gloves, and a needle is inserted into the port.  Only "non-coring" port needles should be used to access the port. Once the port is accessed, a blood return should be checked. This helps ensure that the port  is in the vein and is not clogged.  If your port needs to remain accessed for a constant infusion, a clear (transparent) bandage will be placed over the needle site. The bandage and needle will need to be changed every week, or as directed by your health care provider.  Keep the bandage covering the needle clean and dry. Do not get it wet. Follow your health care provider's instructions on how to take a shower or bath while the port is accessed.  If your port does not need to stay accessed, no bandage is needed over the port.  What is flushing? Flushing helps keep the port from getting clogged. Follow your health care provider's instructions on how and when to flush the port. Ports are usually flushed with saline solution or a medicine called heparin. The need for flushing will depend on how the port is used.  If the port is used for intermittent medicines or blood draws, the port will need to be flushed: ? After medicines have been given. ? After blood has been drawn. ? As part of routine maintenance.  If a constant infusion is running, the port may not need to be flushed.  How long will my port stay implanted? The port can stay in for as long as your health care provider thinks it is needed. When it is time for the port to come out, surgery will be   done to remove it. The procedure is similar to the one performed when the port was put in. When should I seek immediate medical care? When you have an implanted port, you should seek immediate medical care if:  You notice a bad smell coming from the incision site.  You have swelling, redness, or drainage at the incision site.  You have more swelling or pain at the port site or the surrounding area.  You have a fever that is not controlled with medicine.  This information is not intended to replace advice given to you by your health care provider. Make sure you discuss any questions you have with your health care provider. Document  Released: 02/24/2005 Document Revised: 08/02/2015 Document Reviewed: 11/01/2012 Elsevier Interactive Patient Education  2017 [employer]  

## 2016-11-14 NOTE — Assessment & Plan Note (Signed)
The cause is unknown. It is mild and there is little change compared from previous platelet count. The patient denies recent history of bleeding such as epistaxis, hematuria or hematochezia. She is asymptomatic from the thrombocytopenia. I will observe for now.

## 2016-11-14 NOTE — Progress Notes (Signed)
[facility] OFF[facility] PROGRESS NOTE  Patient Care Team: [name], MD as PCP - General [name], MD as Consulting Physician (Oncology) [name], MD as Consulting Physician (Neurosurgery) [name], MD as Consulting Physician (Hematology and Oncology) [name], [name], MD as Referring Physician (Hematology and Oncology)  SUMMARY OF ONCOLOGIC HISTORY:   Multiple myeloma in relapse [facility])   07/21/2007 Bone Marrow Biopsy    Case #: [id number] Bone marrow showed myeloma      07/21/2007 Miscellaneous    She was diagnosed in May 2009. Had several cycles of RVD      10/14/2007 Bone Marrow Biopsy    Case #: [id number] repeat bone marrow biopsy showed good response to Rx      12/31/2007 Bone Marrow Transplant    She had autologous BMT at [facility]      07/19/2009 Bone Marrow Biopsy    Case #: [id number] Bone marrow biopsy showed only 1% plasma cells      10/10/2010 Bone Marrow Biopsy    [id number] Bone marrow biopsy showed 2% plasma cells      09/25/2011 Bone Marrow Biopsy    Accession #: [id number] Bone marrow only showed 4% plasma cells with ligh chain excess      10/14/2011 - 06/13/2013 Chemotherapy    She received Revlimid only, discontinued due to pancytopenia      06/18/2015 - 09/13/2015 Chemotherapy    She resumed taking Revlimid and dexamethasone. Treatment is stopped due to minimum response and pancytopenia      10/19/2015 -  Chemotherapy    The patient had Velcade for chemotherapy treatment along with weekly Daratumumab. Last dose of Velcade on 02/29/16, stopped due to progression. Pomalidomide added on 03/14/16      04/11/2016 Adverse Reaction    Delay resumption of Pomalyst cycle 2 until 04/16/16 due to neutropenia      09/16/2016 PET scan    1. No findings of active bony lymphoma. Prior activity in the sternum and thoracic spine has resolved. 2. Focal hypermetabolic activity along the sigmoid colon with some local inflammatory stranding  in the adjacent mesentery, maximum SUV 11.0. This is probably from mild acute diverticulitis. There is no hypermetabolic activity in this vicinity 4 months ago to suggest that this is from colon cancer. Correlate with any symptoms. 3. Acute right maxillary sinusitis. 4. Thoracolumbar compression fractures, vertebral augmentation at the L1 level.      09/17/2016 Procedure    CT guided bone marrow biopsy of right posterior iliac bone with both aspirate and core samples obtained.      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 - TRILINEAGE HEMATOPOIESIS. - SEE COMMENT. PERIPHERAL BLOOD: - LYMPHOPENIA. - THROMBOCYTOPENIA. Diagnosis Note The bone marrow is slightly hypocellular for age with trilineage hematopoiesis and non-specific myeloid changes likely related to previous therapy. Plasma cells are increased in number representing 8% of all cells in the aspirate, but with lack of large aggregates or sheets in the clot and biopsy sections. Immunohistochemical stains highlight the slightly increased plasma cell component in the marrow which shows kappa light chain restriction consistent with residual/recurrent plasma cell neoplasm. Correlation with cytogenetic and FISH studies recommended      09/17/2016 Pathology Results    Normal bone marrow cytogenetics and FISH       INTERVAL HISTORY: Please see below for problem oriented charting. She is seen in  the infusion room She feels well Denies side effects from treatment No recent bone pain She had recent dental visit without signs of symptom of osteonecrosis of the jaw. The patient denies any recent signs or symptoms of bleeding such as spontaneous epistaxis, hematuria or hematochezia.   REVIEW OF SYSTEMS:   Constitutional: Denies fevers, chills or abnormal weight loss Eyes: Denies blurriness of vision Ears, nose, mouth,  throat, and face: Denies mucositis or sore throat Respiratory: Denies cough, dyspnea or wheezes Cardiovascular: Denies palpitation, chest discomfort or lower extremity swelling Gastrointestinal:  Denies nausea, heartburn or change in bowel habits Skin: Denies abnormal skin rashes Lymphatics: Denies new lymphadenopathy or easy bruising Neurological:Denies numbness, tingling or new weaknesses Behavioral/Psych: Mood is stable, no new changes  All other systems were reviewed with the patient and are negative.  I have reviewed the past medical history, past surgical history, social history and family history with the patient and they are unchanged from previous note.  ALLERGIES:  is allergic to hydromorphone.  MEDICATIONS:  Current Outpatient Prescriptions  Medication Sig Dispense Refill  . acyclovir (ZOVIRAX) 400 MG tablet Take 1 tablet (400 mg total) by mouth 2 (two) times daily. 60 tablet 11  . aspirin 81 MG tablet Take 81 mg by mouth.    . cholecalciferol (VITAMIN D) 1000 UNITS tablet Take 1,000 Units by mouth daily.    Marland Kitchen dexamethasone (DECADRON) 4 MG tablet Take 5 tabs once a week with breakfast every Tuesdays 90 tablet 1  . Multiple Vitamins-Minerals (ONE-A-DAY 50 PLUS PO) Take by mouth daily.    Marland Kitchen oxycodone (OXY-IR) 5 MG capsule Take 5 mg by mouth every 4 (four) hours as needed. pain    . Oxycodone HCl (OXYCONTIN) 60 MG TB12 Take 1 tablet by mouth 2 (two) times daily.    . pantoprazole (PROTONIX) 40 MG tablet Take 1 tablet (40 mg total) by mouth daily. 30 tablet 9  . POMALYST 2 MG capsule TAKE 1 CAPSULE (2MG) BY MOUTH DAILY ON DAYS 1-21, REPEAT EVERY 28 DAYS. 21 capsule 11   No current facility-administered medications for this visit.    Facility-Administered Medications Ordered in Other Visits  Medication Dose Route Frequency Provider Last Rate Last Dose  . heparin lock flush 100 unit/mL  500 Units Intracatheter Once PRN Alvy Bimler, Markise Haymer, MD      . sodium chloride flush (NS) 0.9 %  injection 10 mL  10 mL Intracatheter PRN Alvy Bimler, Dynver Clemson, MD      . Zoledronic Acid (ZOMETA) 4 mg IVPB  4 mg Intravenous Once Alvy Bimler, Mariah Harn, MD        PHYSICAL EXAMINATION: ECOG PERFORMANCE STATUS: 1 - Symptomatic but completely ambulatory GENERAL:alert, no distress and comfortable SKIN: skin color, texture, turgor are normal, no rashes or significant lesions EYES: normal, Conjunctiva are pink and non-injected, sclera clear OROPHARYNX:no exudate, no erythema and lips, buccal mucosa, and tongue normal  NECK: supple, thyroid normal size, non-tender, without nodularity LYMPH:  no palpable lymphadenopathy in the cervical, axillary or inguinal LUNGS: clear to auscultation and percussion with normal breathing effort HEART: regular rate & rhythm and no murmurs and no lower extremity edema ABDOMEN:abdomen soft, non-tender and normal bowel sounds Musculoskeletal:no cyanosis of digits and no clubbing  NEURO: alert & oriented x 3 with fluent speech, no focal motor/sensory deficits  LABORATORY DATA:  I have reviewed the data as listed    Component Value Date/Time   NA 140 11/14/2016 0852   K 3.6 11/14/2016 0852   CL 107 08/30/2012 1055  CO2 26 11/14/2016 0852   GLUCOSE 85 11/14/2016 0852   GLUCOSE 102 (H) 08/30/2012 1055   BUN 22.3 11/14/2016 0852   CREATININE 0.8 11/14/2016 0852   CALCIUM 9.4 11/14/2016 0852   PROT 6.1 (L) 11/14/2016 0852   ALBUMIN 3.7 11/14/2016 0852   AST 20 11/14/2016 0852   ALT 41 11/14/2016 0852   ALKPHOS 50 11/14/2016 0852   BILITOT 0.46 11/14/2016 0852    No results found for: SPEP, UPEP  Lab Results  Component Value Date   WBC 5.9 11/14/2016   NEUTROABS 3.0 11/14/2016   HGB 12.4 11/14/2016   HCT 37.0 11/14/2016   MCV 100.3 11/14/2016   PLT 136 (L) 11/14/2016      Chemistry      Component Value Date/Time   NA 140 11/14/2016 0852   K 3.6 11/14/2016 0852   CL 107 08/30/2012 1055   CO2 26 11/14/2016 0852   BUN 22.3 11/14/2016 0852   CREATININE 0.8  11/14/2016 0852      Component Value Date/Time   CALCIUM 9.4 11/14/2016 0852   ALKPHOS 50 11/14/2016 0852   AST 20 11/14/2016 0852   ALT 41 11/14/2016 0852   BILITOT 0.46 11/14/2016 0852     ASSESSMENT & PLAN:  Multiple myeloma in relapse ([place]) She tolerated treatment well without any side effects We will continue treatment without dose adjustment Recent myeloma panel show positive response to treatment She is reminded to take antimicrobial prophylaxis and aspirin for DVT prophylaxis She is on calcium with vitamin D and she gets Zometa every 6 months, next due in October 2018 She will continue high-dose steroid once a week Repeat myeloma panel is pending I will call her once test results is available She had recent dental visit with no signs of osteonecrosis of the jaw.  Thrombocytopenia The cause is unknown. It is mild and there is little change compared from previous platelet count. The patient denies recent history of bleeding such as epistaxis, hematuria or hematochezia. She is asymptomatic from the thrombocytopenia. I will observe for now.    No orders of the defined types were placed in this encounter.  All questions were answered. The patient knows to call the clinic with any problems, questions or concerns. No barriers to learning was detected. I spent 10 minutes counseling the patient face to face. The total time spent in the appointment was 15 minutes and more than 50% was on counseling and review of test results     [name], MD 11/14/2016 12:57 PM

## 2016-11-14 NOTE — Assessment & Plan Note (Signed)
She tolerated treatment well without any side effects We will continue treatment without dose adjustment Recent myeloma panel show positive response to treatment She is reminded to take antimicrobial prophylaxis and aspirin for DVT prophylaxis She is on calcium with vitamin D and she gets Zometa every 6 months, next due in October 2018 She will continue high-dose steroid once a week Repeat myeloma panel is pending I will call her once test results is available She had recent dental visit with no signs of osteonecrosis of the jaw.

## 2016-11-17 LAB — KAPPA/LAMBDA LIGHT CHAINS
IG KAPPA FREE LIGHT CHAIN: 129.4 mg/L — AB (ref 3.3–19.4)
Ig Lambda Free Light Chain: 3.2 mg/L — ABNORMAL LOW (ref 5.7–26.3)
Kappa/Lambda FluidC Ratio: 40.44 — ABNORMAL HIGH (ref 0.26–1.65)

## 2016-11-19 ENCOUNTER — Telehealth: Payer: Self-pay | Admitting: *Deleted

## 2016-11-19 LAB — MULTIPLE MYELOMA PANEL, SERUM
ALBUMIN/GLOB SERPL: 1.7 (ref 0.7–1.7)
ALPHA 1: 0.3 g/dL (ref 0.0–0.4)
Albumin SerPl Elph-Mcnc: 3.7 g/dL (ref 2.9–4.4)
Alpha2 Glob SerPl Elph-Mcnc: 0.8 g/dL (ref 0.4–1.0)
B-Globulin SerPl Elph-Mcnc: 0.8 g/dL (ref 0.7–1.3)
GAMMA GLOB SERPL ELPH-MCNC: 0.3 g/dL — AB (ref 0.4–1.8)
GLOBULIN, TOTAL: 2.2 g/dL (ref 2.2–3.9)
IGA/IMMUNOGLOBULIN A, SERUM: 6 mg/dL — AB (ref 87–352)
IgG, Qn, Serum: 366 mg/dL — ABNORMAL LOW (ref 700–1600)
IgM, Qn, Serum: 10 mg/dL — ABNORMAL LOW (ref 26–217)
M Protein SerPl Elph-Mcnc: 0.1 g/dL — ABNORMAL HIGH
Total Protein: 5.9 g/dL — ABNORMAL LOW (ref 6.0–8.5)

## 2016-11-19 NOTE — Telephone Encounter (Signed)
Notified of message below

## 2016-11-19 NOTE — Telephone Encounter (Signed)
-----   Message from [name], MD sent at 11/19/2016  3:48 PM EDT ----- Regarding: labs are better pls let her know labs are better ----- Message ----- From: Interface, Lab In Three Zero One Sent: 11/14/2016   9:25 AM To: [name], MD

## 2016-11-28 ENCOUNTER — Other Ambulatory Visit: Payer: Self-pay

## 2016-11-28 ENCOUNTER — Other Ambulatory Visit: Payer: Self-pay | Admitting: Hematology and Oncology

## 2016-11-28 ENCOUNTER — Ambulatory Visit (HOSPITAL_BASED_OUTPATIENT_CLINIC_OR_DEPARTMENT_OTHER): Payer: 59

## 2016-11-28 VITALS — BP 125/78 | HR 75 | Temp 98.2°F | Resp 17

## 2016-11-28 DIAGNOSIS — Z5112 Encounter for antineoplastic immunotherapy: Secondary | ICD-10-CM | POA: Diagnosis not present

## 2016-11-28 DIAGNOSIS — C9002 Multiple myeloma in relapse: Secondary | ICD-10-CM | POA: Diagnosis not present

## 2016-11-28 DIAGNOSIS — C9001 Multiple myeloma in remission: Secondary | ICD-10-CM

## 2016-11-28 LAB — COMPREHENSIVE METABOLIC PANEL
ALBUMIN: 3.8 g/dL (ref 3.5–5.0)
ALK PHOS: 44 U/L (ref 40–150)
ALT: 14 U/L (ref 0–55)
ANION GAP: 9 meq/L (ref 3–11)
AST: 18 U/L (ref 5–34)
BUN: 18 mg/dL (ref 7.0–26.0)
CALCIUM: 9.1 mg/dL (ref 8.4–10.4)
CHLORIDE: 105 meq/L (ref 98–109)
CO2: 25 mEq/L (ref 22–29)
Creatinine: 0.8 mg/dL (ref 0.6–1.1)
EGFR: 86 mL/min/{1.73_m2} — AB (ref >90)
Glucose: 84 mg/dl (ref 70–140)
POTASSIUM: 3.7 meq/L (ref 3.5–5.1)
Sodium: 139 mEq/L (ref 136–145)
Total Bilirubin: 0.6 mg/dL (ref 0.20–1.20)
Total Protein: 6 g/dL — ABNORMAL LOW (ref 6.4–8.3)

## 2016-11-28 LAB — CBC WITH DIFFERENTIAL/PLATELET
BASO%: 0.7 % (ref 0.0–2.0)
Basophils Absolute: 0 10*3/uL (ref 0.0–0.1)
EOS ABS: 0.3 10*3/uL (ref 0.0–0.5)
EOS%: 5.1 % (ref 0.0–7.0)
HEMATOCRIT: 36.9 % (ref 34.8–46.6)
HGB: 12.1 g/dL (ref 11.6–15.9)
LYMPH#: 1.4 10*3/uL (ref 0.9–3.3)
LYMPH%: 23.7 % (ref 14.0–49.7)
MCH: 33.2 pg (ref 25.1–34.0)
MCHC: 32.8 g/dL (ref 31.5–36.0)
MCV: 101.4 fL — AB (ref 79.5–101.0)
MONO#: 1.2 10*3/uL — ABNORMAL HIGH (ref 0.1–0.9)
MONO%: 18.9 % — ABNORMAL HIGH (ref 0.0–14.0)
NEUT#: 3.1 10*3/uL (ref 1.5–6.5)
NEUT%: 51.6 % (ref 38.4–76.8)
PLATELETS: 173 10*3/uL (ref 145–400)
RBC: 3.64 10*6/uL — ABNORMAL LOW (ref 3.70–5.45)
RDW: 14.6 % — ABNORMAL HIGH (ref 11.2–14.5)
WBC: 6.1 10*3/uL (ref 3.9–10.3)

## 2016-11-28 MED ORDER — ACETAMINOPHEN 325 MG PO TABS
650.0000 mg | ORAL_TABLET | Freq: Once | ORAL | Status: AC
  Administered 2016-11-28: 650 mg via ORAL

## 2016-11-28 MED ORDER — ACETAMINOPHEN 325 MG PO TABS
ORAL_TABLET | ORAL | Status: AC
  Filled 2016-11-28: qty 2

## 2016-11-28 MED ORDER — SODIUM CHLORIDE 0.9% FLUSH
10.0000 mL | INTRAVENOUS | Status: DC | PRN
  Administered 2016-11-28: 10 mL
  Filled 2016-11-28: qty 10

## 2016-11-28 MED ORDER — PROCHLORPERAZINE MALEATE 10 MG PO TABS
ORAL_TABLET | ORAL | Status: AC
  Filled 2016-11-28: qty 1

## 2016-11-28 MED ORDER — HEPARIN SOD (PORK) LOCK FLUSH 100 UNIT/ML IV SOLN
500.0000 [IU] | Freq: Once | INTRAVENOUS | Status: AC | PRN
  Administered 2016-11-28: 500 [IU]
  Filled 2016-11-28: qty 5

## 2016-11-28 MED ORDER — DIPHENHYDRAMINE HCL 25 MG PO CAPS
50.0000 mg | ORAL_CAPSULE | Freq: Once | ORAL | Status: AC
  Administered 2016-11-28: 50 mg via ORAL

## 2016-11-28 MED ORDER — PROCHLORPERAZINE MALEATE 10 MG PO TABS
10.0000 mg | ORAL_TABLET | Freq: Once | ORAL | Status: AC
  Administered 2016-11-28: 10 mg via ORAL

## 2016-11-28 MED ORDER — DEXAMETHASONE SODIUM PHOSPHATE 100 MG/10ML IJ SOLN
20.0000 mg | Freq: Once | INTRAMUSCULAR | Status: AC
  Administered 2016-11-28: 20 mg via INTRAVENOUS
  Filled 2016-11-28: qty 2

## 2016-11-28 MED ORDER — SODIUM CHLORIDE 0.9 % IV SOLN
15.3000 mg/kg | Freq: Once | INTRAVENOUS | Status: AC
  Administered 2016-11-28: 900 mg via INTRAVENOUS
  Filled 2016-11-28: qty 40

## 2016-11-28 MED ORDER — SODIUM CHLORIDE 0.9 % IV SOLN
Freq: Once | INTRAVENOUS | Status: AC
  Administered 2016-11-28: 09:00:00 via INTRAVENOUS

## 2016-11-28 MED ORDER — DIPHENHYDRAMINE HCL 25 MG PO CAPS
ORAL_CAPSULE | ORAL | Status: AC
  Filled 2016-11-28: qty 2

## 2016-11-28 NOTE — Patient Instructions (Signed)
[facility] Discharge Instructions for Patients Receiving Chemotherapy  Today you received the following chemotherapy agents: Darzalex  To help prevent nausea and vomiting after your treatment, we encourage you to take your nausea medication as directed.    If you develop nausea and vomiting that is not controlled by your nausea medication, call the clinic.   BELOW ARE SYMPTOMS THAT SHOULD BE REPORTED IMMEDIATELY:  *FEVER GREATER THAN 100.5 F  *CHILLS WITH OR WITHOUT FEVER  NAUSEA AND VOMITING THAT IS NOT CONTROLLED WITH YOUR NAUSEA MEDICATION  *UNUSUAL SHORTNESS OF BREATH  *UNUSUAL BRUISING OR BLEEDING  TENDERNESS IN MOUTH AND THROAT WITH OR WITHOUT PRESENCE OF ULCERS  *URINARY PROBLEMS  *BOWEL PROBLEMS  UNUSUAL RASH Items with * indicate a potential emergency and should be followed up as soon as possible.  Feel free to call the clinic should you have any questions or concerns. The clinic phone number is [phone].  Please show the CHEMO ALERT CARD at check-in to the Emergency Department and triage nurse.   

## 2016-12-01 ENCOUNTER — Other Ambulatory Visit: Payer: Self-pay | Admitting: Hematology and Oncology

## 2016-12-01 DIAGNOSIS — C9002 Multiple myeloma in relapse: Secondary | ICD-10-CM

## 2016-12-01 LAB — KAPPA/LAMBDA LIGHT CHAINS
IG KAPPA FREE LIGHT CHAIN: 99.4 mg/L — AB (ref 3.3–19.4)
IG LAMBDA FREE LIGHT CHAIN: 3.2 mg/L — AB (ref 5.7–26.3)
KAPPA/LAMBDA FLC RATIO: 31.06 — AB (ref 0.26–1.65)

## 2016-12-03 LAB — MULTIPLE MYELOMA PANEL, SERUM
ALBUMIN SERPL ELPH-MCNC: 3.6 g/dL (ref 2.9–4.4)
ALBUMIN/GLOB SERPL: 1.8 — AB (ref 0.7–1.7)
ALPHA 1: 0.3 g/dL (ref 0.0–0.4)
Alpha2 Glob SerPl Elph-Mcnc: 0.7 g/dL (ref 0.4–1.0)
B-GLOBULIN SERPL ELPH-MCNC: 0.9 g/dL (ref 0.7–1.3)
GAMMA GLOB SERPL ELPH-MCNC: 0.3 g/dL — AB (ref 0.4–1.8)
GLOBULIN, TOTAL: 2.1 g/dL — AB (ref 2.2–3.9)
IgA, Qn, Serum: 7 mg/dL — ABNORMAL LOW (ref 87–352)
IgG, Qn, Serum: 359 mg/dL — ABNORMAL LOW (ref 700–1600)
IgM, Qn, Serum: 11 mg/dL — ABNORMAL LOW (ref 26–217)
M PROTEIN SERPL ELPH-MCNC: 0.1 g/dL — AB
Total Protein: 5.7 g/dL — ABNORMAL LOW (ref 6.0–8.5)

## 2016-12-04 ENCOUNTER — Telehealth: Payer: Self-pay | Admitting: *Deleted

## 2016-12-04 NOTE — Telephone Encounter (Signed)
Notified of message below

## 2016-12-04 NOTE — Telephone Encounter (Signed)
-----   Message from [name], MD sent at 12/04/2016  9:26 AM EDT ----- Regarding: labs OK pls let her know light chains are better ----- Message ----- From: Interface, Lab In Three Zero One Sent: 11/28/2016   9:46 AM To: [name], MD

## 2016-12-12 ENCOUNTER — Ambulatory Visit: Payer: 59 | Admitting: Hematology and Oncology

## 2016-12-12 ENCOUNTER — Ambulatory Visit: Payer: 59

## 2016-12-12 ENCOUNTER — Telehealth: Payer: Self-pay | Admitting: Hematology and Oncology

## 2016-12-12 ENCOUNTER — Ambulatory Visit (HOSPITAL_BASED_OUTPATIENT_CLINIC_OR_DEPARTMENT_OTHER): Payer: 59 | Admitting: Hematology and Oncology

## 2016-12-12 ENCOUNTER — Other Ambulatory Visit: Payer: 59

## 2016-12-12 ENCOUNTER — Ambulatory Visit (HOSPITAL_BASED_OUTPATIENT_CLINIC_OR_DEPARTMENT_OTHER): Payer: 59

## 2016-12-12 ENCOUNTER — Encounter: Payer: Self-pay | Admitting: Hematology and Oncology

## 2016-12-12 ENCOUNTER — Other Ambulatory Visit (HOSPITAL_BASED_OUTPATIENT_CLINIC_OR_DEPARTMENT_OTHER): Payer: 59

## 2016-12-12 VITALS — BP 111/75 | HR 71 | Temp 98.9°F | Resp 18

## 2016-12-12 DIAGNOSIS — Z5112 Encounter for antineoplastic immunotherapy: Secondary | ICD-10-CM

## 2016-12-12 DIAGNOSIS — D696 Thrombocytopenia, unspecified: Secondary | ICD-10-CM

## 2016-12-12 DIAGNOSIS — C9002 Multiple myeloma in relapse: Secondary | ICD-10-CM | POA: Diagnosis not present

## 2016-12-12 DIAGNOSIS — D801 Nonfamilial hypogammaglobulinemia: Secondary | ICD-10-CM | POA: Diagnosis not present

## 2016-12-12 LAB — COMPREHENSIVE METABOLIC PANEL
ALT: 20 U/L (ref 0–55)
AST: 22 U/L (ref 5–34)
Albumin: 3.6 g/dL (ref 3.5–5.0)
Alkaline Phosphatase: 47 U/L (ref 40–150)
Anion Gap: 8 mEq/L (ref 3–11)
BILIRUBIN TOTAL: 0.38 mg/dL (ref 0.20–1.20)
BUN: 17.8 mg/dL (ref 7.0–26.0)
CO2: 26 meq/L (ref 22–29)
Calcium: 8.8 mg/dL (ref 8.4–10.4)
Chloride: 107 mEq/L (ref 98–109)
Creatinine: 0.8 mg/dL (ref 0.6–1.1)
EGFR: 85 mL/min/{1.73_m2} — AB (ref >90)
GLUCOSE: 85 mg/dL (ref 70–140)
Potassium: 3.3 mEq/L — ABNORMAL LOW (ref 3.5–5.1)
SODIUM: 141 meq/L (ref 136–145)
TOTAL PROTEIN: 5.9 g/dL — AB (ref 6.4–8.3)

## 2016-12-12 LAB — CBC WITH DIFFERENTIAL/PLATELET
BASO%: 1.8 % (ref 0.0–2.0)
BASOS ABS: 0.1 10*3/uL (ref 0.0–0.1)
EOS%: 3 % (ref 0.0–7.0)
Eosinophils Absolute: 0.1 10*3/uL (ref 0.0–0.5)
HEMATOCRIT: 35.7 % (ref 34.8–46.6)
HGB: 11.9 g/dL (ref 11.6–15.9)
LYMPH#: 1.6 10*3/uL (ref 0.9–3.3)
LYMPH%: 37.3 % (ref 14.0–49.7)
MCH: 33.7 pg (ref 25.1–34.0)
MCHC: 33.3 g/dL (ref 31.5–36.0)
MCV: 101.1 fL — ABNORMAL HIGH (ref 79.5–101.0)
MONO#: 1 10*3/uL — AB (ref 0.1–0.9)
MONO%: 22.1 % — ABNORMAL HIGH (ref 0.0–14.0)
NEUT#: 1.6 10*3/uL (ref 1.5–6.5)
NEUT%: 35.8 % — ABNORMAL LOW (ref 38.4–76.8)
PLATELETS: 143 10*3/uL — AB (ref 145–400)
RBC: 3.53 10*6/uL — ABNORMAL LOW (ref 3.70–5.45)
RDW: 14.2 % (ref 11.2–14.5)
WBC: 4.3 10*3/uL (ref 3.9–10.3)

## 2016-12-12 MED ORDER — ACETAMINOPHEN 325 MG PO TABS
650.0000 mg | ORAL_TABLET | Freq: Once | ORAL | Status: AC
  Administered 2016-12-12: 650 mg via ORAL

## 2016-12-12 MED ORDER — DIPHENHYDRAMINE HCL 25 MG PO CAPS
50.0000 mg | ORAL_CAPSULE | Freq: Once | ORAL | Status: AC
  Administered 2016-12-12: 50 mg via ORAL

## 2016-12-12 MED ORDER — SODIUM CHLORIDE 0.9 % IV SOLN
15.2000 mg/kg | Freq: Once | INTRAVENOUS | Status: AC
  Administered 2016-12-12: 900 mg via INTRAVENOUS
  Filled 2016-12-12: qty 40

## 2016-12-12 MED ORDER — DIPHENHYDRAMINE HCL 25 MG PO CAPS
ORAL_CAPSULE | ORAL | Status: AC
  Filled 2016-12-12: qty 2

## 2016-12-12 MED ORDER — ACETAMINOPHEN 325 MG PO TABS
ORAL_TABLET | ORAL | Status: AC
  Filled 2016-12-12: qty 2

## 2016-12-12 MED ORDER — PROCHLORPERAZINE MALEATE 10 MG PO TABS
ORAL_TABLET | ORAL | Status: AC
  Filled 2016-12-12: qty 1

## 2016-12-12 MED ORDER — SODIUM CHLORIDE 0.9% FLUSH
10.0000 mL | INTRAVENOUS | Status: DC | PRN
  Administered 2016-12-12: 10 mL
  Filled 2016-12-12: qty 10

## 2016-12-12 MED ORDER — SODIUM CHLORIDE 0.9 % IV SOLN
20.0000 mg | Freq: Once | INTRAVENOUS | Status: AC
  Administered 2016-12-12: 20 mg via INTRAVENOUS
  Filled 2016-12-12: qty 2

## 2016-12-12 MED ORDER — HEPARIN SOD (PORK) LOCK FLUSH 100 UNIT/ML IV SOLN
500.0000 [IU] | Freq: Once | INTRAVENOUS | Status: AC | PRN
  Administered 2016-12-12: 500 [IU]
  Filled 2016-12-12: qty 5

## 2016-12-12 MED ORDER — PROCHLORPERAZINE MALEATE 10 MG PO TABS
10.0000 mg | ORAL_TABLET | Freq: Once | ORAL | Status: AC
  Administered 2016-12-12: 10 mg via ORAL

## 2016-12-12 MED ORDER — SODIUM CHLORIDE 0.9 % IV SOLN
Freq: Once | INTRAVENOUS | Status: AC
  Administered 2016-12-12: 09:00:00 via INTRAVENOUS

## 2016-12-12 NOTE — Assessment & Plan Note (Signed)
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

## 2016-12-12 NOTE — Patient Instructions (Signed)
[facility] Discharge Instructions for Patients Receiving Chemotherapy  Today you received the following chemotherapy agents: Darzalex  To help prevent nausea and vomiting after your treatment, we encourage you to take your nausea medication as directed.    If you develop nausea and vomiting that is not controlled by your nausea medication, call the clinic.   BELOW ARE SYMPTOMS THAT SHOULD BE REPORTED IMMEDIATELY:  *FEVER GREATER THAN 100.5 F  *CHILLS WITH OR WITHOUT FEVER  NAUSEA AND VOMITING THAT IS NOT CONTROLLED WITH YOUR NAUSEA MEDICATION  *UNUSUAL SHORTNESS OF BREATH  *UNUSUAL BRUISING OR BLEEDING  TENDERNESS IN MOUTH AND THROAT WITH OR WITHOUT PRESENCE OF ULCERS  *URINARY PROBLEMS  *BOWEL PROBLEMS  UNUSUAL RASH Items with * indicate a potential emergency and should be followed up as soon as possible.  Feel free to call the clinic should you have any questions or concerns. The clinic phone number is [phone].  Please show the CHEMO ALERT CARD at check-in to the Emergency Department and triage nurse.   

## 2016-12-12 NOTE — Telephone Encounter (Signed)
Gave avs and calendar for October and November  °

## 2016-12-12 NOTE — Progress Notes (Signed)
[facility] OFFICE PROGRESS NOTE  Patient Care Team: [name], MD as PCP - General [name], MD as Consulting Physician (Oncology) [name], MD as Consulting Physician (Neurosurgery) [name], MD as Consulting Physician (Hematology and Oncology) [name], [name], MD as Referring Physician (Hematology and Oncology)  SUMMARY OF ONCOLOGIC HISTORY:   Multiple myeloma in relapse [facility])   07/21/2007 Bone Marrow Biopsy    Case #: [id number] Bone marrow showed myeloma      07/21/2007 Miscellaneous    She was diagnosed in May 2009. Had several cycles of RVD      10/14/2007 Bone Marrow Biopsy    Case #: [id number] repeat bone marrow biopsy showed good response to Rx      12/31/2007 Bone Marrow Transplant    She had autologous BMT at [facility]      07/19/2009 Bone Marrow Biopsy    Case #: [id number] Bone marrow biopsy showed only 1% plasma cells      10/10/2010 Bone Marrow Biopsy    [id number] Bone marrow biopsy showed 2% plasma cells      09/25/2011 Bone Marrow Biopsy    Accession #: [id number] Bone marrow only showed 4% plasma cells with ligh chain excess      10/14/2011 - 06/13/2013 Chemotherapy    She received Revlimid only, discontinued due to pancytopenia      06/18/2015 - 09/13/2015 Chemotherapy    She resumed taking Revlimid and dexamethasone. Treatment is stopped due to minimum response and pancytopenia      10/19/2015 -  Chemotherapy    The patient had Velcade for chemotherapy treatment along with weekly Daratumumab. Last dose of Velcade on 02/29/16, stopped due to progression. Pomalidomide added on 03/14/16      04/11/2016 Adverse Reaction    Delay resumption of Pomalyst cycle 2 until 04/16/16 due to neutropenia      09/16/2016 PET scan    1. No findings of active bony lymphoma. Prior activity in the sternum and thoracic spine has resolved. 2. Focal hypermetabolic activity along the sigmoid colon with some local inflammatory stranding  in the adjacent mesentery, maximum SUV 11.0. This is probably from mild acute diverticulitis. There is no hypermetabolic activity in this vicinity 4 months ago to suggest that this is from colon cancer. Correlate with any symptoms. 3. Acute right maxillary sinusitis. 4. Thoracolumbar compression fractures, vertebral augmentation at the L1 level.      09/17/2016 Procedure    CT guided bone marrow biopsy of right posterior iliac bone with both aspirate and core samples obtained.      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 - TRILINEAGE HEMATOPOIESIS. - SEE COMMENT. PERIPHERAL BLOOD: - LYMPHOPENIA. - THROMBOCYTOPENIA. Diagnosis Note The bone marrow is slightly hypocellular for age with trilineage hematopoiesis and non-specific myeloid changes likely related to previous therapy. Plasma cells are increased in number representing 8% of all cells in the aspirate, but with lack of large aggregates or sheets in the clot and biopsy sections. Immunohistochemical stains highlight the slightly increased plasma cell component in the marrow which shows kappa light chain restriction consistent with residual/recurrent plasma cell neoplasm. Correlation with cytogenetic and FISH studies recommended      09/17/2016 Pathology Results    Normal bone marrow cytogenetics and FISH       INTERVAL HISTORY: Please see below for problem oriented charting. She returns for further  follow-up She denies recent side effects from treatment No recent infection She is compliant taking medication as directed No new bone pain The patient denies any recent signs or symptoms of bleeding such as spontaneous epistaxis, hematuria or hematochezia.   REVIEW OF SYSTEMS:   Constitutional: Denies fevers, chills or abnormal weight loss Eyes: Denies blurriness of vision Ears, nose, mouth, throat, and face: Denies  mucositis or sore throat Respiratory: Denies cough, dyspnea or wheezes Cardiovascular: Denies palpitation, chest discomfort or lower extremity swelling Gastrointestinal:  Denies nausea, heartburn or change in bowel habits Skin: Denies abnormal skin rashes Lymphatics: Denies new lymphadenopathy or easy bruising Neurological:Denies numbness, tingling or new weaknesses Behavioral/Psych: Mood is stable, no new changes  All other systems were reviewed with the patient and are negative.  I have reviewed the past medical history, past surgical history, social history and family history with the patient and they are unchanged from previous note.  ALLERGIES:  is allergic to hydromorphone.  MEDICATIONS:  Current Outpatient Prescriptions  Medication Sig Dispense Refill  . acyclovir (ZOVIRAX) 400 MG tablet Take 1 tablet (400 mg total) by mouth 2 (two) times daily. 60 tablet 11  . aspirin 81 MG tablet Take 81 mg by mouth.    . cholecalciferol (VITAMIN D) 1000 UNITS tablet Take 1,000 Units by mouth daily.    Marland Kitchen dexamethasone (DECADRON) 4 MG tablet Take 5 tabs once a week with breakfast every Tuesdays 90 tablet 1  . Multiple Vitamins-Minerals (ONE-A-DAY 50 PLUS PO) Take by mouth daily.    Marland Kitchen oxycodone (OXY-IR) 5 MG capsule Take 5 mg by mouth every 4 (four) hours as needed. pain    . Oxycodone HCl (OXYCONTIN) 60 MG TB12 Take 1 tablet by mouth 2 (two) times daily.    . pantoprazole (PROTONIX) 40 MG tablet Take 1 tablet (40 mg total) by mouth daily. 30 tablet 9  . POMALYST 2 MG capsule TAKE 1 CAPSULE (2MG) BY MOUTH ONCE DAILY ON DAYS 1-21, REPEAT EVERY 28DAYS. 21 capsule 11   No current facility-administered medications for this visit.     PHYSICAL EXAMINATION: ECOG PERFORMANCE STATUS: 0 - Asymptomatic GENERAL:alert, no distress and comfortable SKIN: skin color, texture, turgor are normal, no rashes or significant lesions EYES: normal, Conjunctiva are pink and non-injected, sclera clear OROPHARYNX:no  exudate, no erythema and lips, buccal mucosa, and tongue normal  NECK: supple, thyroid normal size, non-tender, without nodularity LYMPH:  no palpable lymphadenopathy in the cervical, axillary or inguinal LUNGS: clear to auscultation and percussion with normal breathing effort HEART: regular rate & rhythm and no murmurs and no lower extremity edema ABDOMEN:abdomen soft, non-tender and normal bowel sounds Musculoskeletal:no cyanosis of digits and no clubbing  NEURO: alert & oriented x 3 with fluent speech, no focal motor/sensory deficits  LABORATORY DATA:  I have reviewed the data as listed    Component Value Date/Time   NA 141 12/12/2016 0859   K 3.3 (L) 12/12/2016 0859   CL 107 08/30/2012 1055   CO2 26 12/12/2016 0859   GLUCOSE 85 12/12/2016 0859   GLUCOSE 102 (H) 08/30/2012 1055   BUN 17.8 12/12/2016 0859   CREATININE 0.8 12/12/2016 0859   CALCIUM 8.8 12/12/2016 0859   PROT 5.9 (L) 12/12/2016 0859   ALBUMIN 3.6 12/12/2016 0859   AST 22 12/12/2016 0859   ALT 20 12/12/2016 0859   ALKPHOS 47 12/12/2016 0859   BILITOT 0.38 12/12/2016 0859    No results found for: SPEP, UPEP  Lab Results  Component Value Date  WBC 4.3 12/12/2016   NEUTROABS 1.6 12/12/2016   HGB 11.9 12/12/2016   HCT 35.7 12/12/2016   MCV 101.1 (H) 12/12/2016   PLT 143 (L) 12/12/2016      Chemistry      Component Value Date/Time   NA 141 12/12/2016 0859   K 3.3 (L) 12/12/2016 0859   CL 107 08/30/2012 1055   CO2 26 12/12/2016 0859   BUN 17.8 12/12/2016 0859   CREATININE 0.8 12/12/2016 0859      Component Value Date/Time   CALCIUM 8.8 12/12/2016 0859   ALKPHOS 47 12/12/2016 0859   AST 22 12/12/2016 0859   ALT 20 12/12/2016 0859   BILITOT 0.38 12/12/2016 0859      ASSESSMENT & PLAN:  Multiple myeloma in relapse ([place]) She tolerated treatment well without any side effects We will continue treatment without dose adjustment Recent myeloma panel show positive response to treatment She is  reminded to take antimicrobial prophylaxis and aspirin for DVT prophylaxis She is on calcium with vitamin D and she gets Zometa every 6 months, next due in 2 weeks She will continue high-dose steroid once a week She had recent dental visit with no signs of osteonecrosis of the jaw.  Thrombocytopenia The cause is unknown. It is mild and there is little change compared from previous platelet count. The patient denies recent history of bleeding such as epistaxis, hematuria or hematochezia. She is asymptomatic from the thrombocytopenia. I will observe for now.   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  All questions were answered. The patient knows to call the clinic with any problems, questions or concerns. No barriers to learning was detected. I spent 15 minutes counseling the patient face to face. The total time spent in the appointment was 20 minutes and more than 50% was on counseling and review of test results     [name], MD 12/12/2016 5:44 PM

## 2016-12-12 NOTE — Assessment & Plan Note (Signed)
The cause is unknown. It is mild and there is little change compared from previous platelet count. The patient denies recent history of bleeding such as epistaxis, hematuria or hematochezia. She is asymptomatic from the thrombocytopenia. I will observe for now.

## 2016-12-12 NOTE — Assessment & Plan Note (Signed)
She tolerated treatment well without any side effects We will continue treatment without dose adjustment Recent myeloma panel show positive response to treatment She is reminded to take antimicrobial prophylaxis and aspirin for DVT prophylaxis She is on calcium with vitamin D and she gets Zometa every 6 months, next due in 2 weeks She will continue high-dose steroid once a week She had recent dental visit with no signs of osteonecrosis of the jaw.

## 2016-12-23 ENCOUNTER — Other Ambulatory Visit: Payer: Self-pay | Admitting: *Deleted

## 2016-12-23 MED ORDER — POMALYST 2 MG PO CAPS: ORAL_CAPSULE | ORAL | 11 refills | Status: DC

## 2016-12-26 ENCOUNTER — Telehealth: Payer: Self-pay

## 2016-12-26 ENCOUNTER — Other Ambulatory Visit (HOSPITAL_BASED_OUTPATIENT_CLINIC_OR_DEPARTMENT_OTHER): Payer: 59

## 2016-12-26 ENCOUNTER — Ambulatory Visit (HOSPITAL_BASED_OUTPATIENT_CLINIC_OR_DEPARTMENT_OTHER): Payer: 59

## 2016-12-26 VITALS — BP 113/76 | HR 69 | Temp 98.6°F | Resp 78

## 2016-12-26 DIAGNOSIS — Z95828 Presence of other vascular implants and grafts: Secondary | ICD-10-CM

## 2016-12-26 DIAGNOSIS — C9001 Multiple myeloma in remission: Secondary | ICD-10-CM

## 2016-12-26 DIAGNOSIS — C9002 Multiple myeloma in relapse: Secondary | ICD-10-CM

## 2016-12-26 DIAGNOSIS — Z5112 Encounter for antineoplastic immunotherapy: Secondary | ICD-10-CM

## 2016-12-26 LAB — COMPREHENSIVE METABOLIC PANEL
ALT: 16 U/L (ref 0–55)
AST: 19 U/L (ref 5–34)
Albumin: 3.8 g/dL (ref 3.5–5.0)
Alkaline Phosphatase: 46 U/L (ref 40–150)
Anion Gap: 9 mEq/L (ref 3–11)
BILIRUBIN TOTAL: 0.68 mg/dL (ref 0.20–1.20)
BUN: 14.9 mg/dL (ref 7.0–26.0)
CHLORIDE: 105 meq/L (ref 98–109)
CO2: 26 meq/L (ref 22–29)
CREATININE: 0.7 mg/dL (ref 0.6–1.1)
Calcium: 8.7 mg/dL (ref 8.4–10.4)
Glucose: 91 mg/dl (ref 70–140)
Potassium: 3.2 mEq/L — ABNORMAL LOW (ref 3.5–5.1)
SODIUM: 140 meq/L (ref 136–145)
TOTAL PROTEIN: 6 g/dL — AB (ref 6.4–8.3)

## 2016-12-26 LAB — CBC WITH DIFFERENTIAL/PLATELET
BASO%: 1.2 % (ref 0.0–2.0)
BASOS ABS: 0.1 10*3/uL (ref 0.0–0.1)
EOS ABS: 0.3 10*3/uL (ref 0.0–0.5)
EOS%: 5.9 % (ref 0.0–7.0)
HEMATOCRIT: 36.5 % (ref 34.8–46.6)
HGB: 12.1 g/dL (ref 11.6–15.9)
LYMPH#: 1.2 10*3/uL (ref 0.9–3.3)
LYMPH%: 22.5 % (ref 14.0–49.7)
MCH: 33.5 pg (ref 25.1–34.0)
MCHC: 33.2 g/dL (ref 31.5–36.0)
MCV: 101 fL (ref 79.5–101.0)
MONO#: 0.9 10*3/uL (ref 0.1–0.9)
MONO%: 15.9 % — ABNORMAL HIGH (ref 0.0–14.0)
NEUT#: 3 10*3/uL (ref 1.5–6.5)
NEUT%: 54.5 % (ref 38.4–76.8)
PLATELETS: 152 10*3/uL (ref 145–400)
RBC: 3.61 10*6/uL — ABNORMAL LOW (ref 3.70–5.45)
RDW: 14.5 % (ref 11.2–14.5)
WBC: 5.5 10*3/uL (ref 3.9–10.3)

## 2016-12-26 MED ORDER — DIPHENHYDRAMINE HCL 25 MG PO CAPS
ORAL_CAPSULE | ORAL | Status: AC
  Filled 2016-12-26: qty 2

## 2016-12-26 MED ORDER — HEPARIN SOD (PORK) LOCK FLUSH 100 UNIT/ML IV SOLN
500.0000 [IU] | Freq: Once | INTRAVENOUS | Status: AC | PRN
  Administered 2016-12-26: 500 [IU]
  Filled 2016-12-26: qty 5

## 2016-12-26 MED ORDER — DIPHENHYDRAMINE HCL 25 MG PO CAPS
50.0000 mg | ORAL_CAPSULE | Freq: Once | ORAL | Status: AC
  Administered 2016-12-26: 50 mg via ORAL

## 2016-12-26 MED ORDER — SODIUM CHLORIDE 0.9 % IV SOLN
20.0000 mg | Freq: Once | INTRAVENOUS | Status: AC
  Administered 2016-12-26: 20 mg via INTRAVENOUS
  Filled 2016-12-26: qty 2

## 2016-12-26 MED ORDER — SODIUM CHLORIDE 0.9 % IV SOLN
Freq: Once | INTRAVENOUS | Status: AC
  Administered 2016-12-26: 10:00:00 via INTRAVENOUS

## 2016-12-26 MED ORDER — SODIUM CHLORIDE 0.9 % IV SOLN
15.3000 mg/kg | Freq: Once | INTRAVENOUS | Status: AC
  Administered 2016-12-26: 900 mg via INTRAVENOUS
  Filled 2016-12-26: qty 40

## 2016-12-26 MED ORDER — ACETAMINOPHEN 325 MG PO TABS
650.0000 mg | ORAL_TABLET | Freq: Once | ORAL | Status: AC
  Administered 2016-12-26: 650 mg via ORAL

## 2016-12-26 MED ORDER — PROCHLORPERAZINE MALEATE 10 MG PO TABS
ORAL_TABLET | ORAL | Status: AC
  Filled 2016-12-26: qty 1

## 2016-12-26 MED ORDER — SODIUM CHLORIDE 0.9% FLUSH
10.0000 mL | INTRAVENOUS | Status: DC | PRN
  Administered 2016-12-26: 10 mL
  Filled 2016-12-26: qty 10

## 2016-12-26 MED ORDER — ACETAMINOPHEN 325 MG PO TABS
ORAL_TABLET | ORAL | Status: AC
Start: 2016-12-26 — End: 2016-12-26
  Filled 2016-12-26: qty 2

## 2016-12-26 MED ORDER — PROCHLORPERAZINE MALEATE 10 MG PO TABS
10.0000 mg | ORAL_TABLET | Freq: Once | ORAL | Status: AC
  Administered 2016-12-26: 10 mg via ORAL

## 2016-12-26 NOTE — Patient Instructions (Signed)
[facility] Discharge Instructions for Patients Receiving Chemotherapy  Today you received the following chemotherapy agents: Darzalex  To help prevent nausea and vomiting after your treatment, we encourage you to take your nausea medication as directed.    If you develop nausea and vomiting that is not controlled by your nausea medication, call the clinic.   BELOW ARE SYMPTOMS THAT SHOULD BE REPORTED IMMEDIATELY:  *FEVER GREATER THAN 100.5 F  *CHILLS WITH OR WITHOUT FEVER  NAUSEA AND VOMITING THAT IS NOT CONTROLLED WITH YOUR NAUSEA MEDICATION  *UNUSUAL SHORTNESS OF BREATH  *UNUSUAL BRUISING OR BLEEDING  TENDERNESS IN MOUTH AND THROAT WITH OR WITHOUT PRESENCE OF ULCERS  *URINARY PROBLEMS  *BOWEL PROBLEMS  UNUSUAL RASH Items with * indicate a potential emergency and should be followed up as soon as possible.  Feel free to call the clinic should you have any questions or concerns. The clinic phone number is [phone].  Please show the CHEMO ALERT CARD at check-in to the Emergency Department and triage nurse.   

## 2016-12-26 NOTE — Telephone Encounter (Signed)
High potassium Food list taken to patient and copy of lab work in the infusion room.

## 2016-12-26 NOTE — Telephone Encounter (Signed)
-----   Message from [name], MD sent at 12/26/2016 10:27 AM EDT ----- Regarding: low potassium Recommend potassium rich diet only, no need potassium replacement therapy ----- Message ----- From: Interface, Lab In Three Zero One Sent: 12/26/2016   9:52 AM To: [name], MD

## 2016-12-26 NOTE — Progress Notes (Signed)
Patient is due for Zometa today. Informed patient that it is due, but she would like to decline because she states that it gives her "tummy problems." Pharmacy made aware.

## 2016-12-29 LAB — KAPPA/LAMBDA LIGHT CHAINS
IG LAMBDA FREE LIGHT CHAIN: 5.3 mg/L — AB (ref 5.7–26.3)
Ig Kappa Free Light Chain: 110.3 mg/L — ABNORMAL HIGH (ref 3.3–19.4)
Kappa/Lambda FluidC Ratio: 20.81 — ABNORMAL HIGH (ref 0.26–1.65)

## 2016-12-31 LAB — MULTIPLE MYELOMA PANEL, SERUM
ALBUMIN/GLOB SERPL: 1.6 (ref 0.7–1.7)
ALPHA 1: 0.3 g/dL (ref 0.0–0.4)
Albumin SerPl Elph-Mcnc: 3.4 g/dL (ref 2.9–4.4)
Alpha2 Glob SerPl Elph-Mcnc: 0.7 g/dL (ref 0.4–1.0)
B-Globulin SerPl Elph-Mcnc: 0.8 g/dL (ref 0.7–1.3)
Gamma Glob SerPl Elph-Mcnc: 0.4 g/dL (ref 0.4–1.8)
Globulin, Total: 2.2 g/dL (ref 2.2–3.9)
IGA/IMMUNOGLOBULIN A, SERUM: 6 mg/dL — AB (ref 87–352)
IGM (IMMUNOGLOBIN M), SRM: 10 mg/dL — AB (ref 26–217)
IgG, Qn, Serum: 342 mg/dL — ABNORMAL LOW (ref 700–1600)
M Protein SerPl Elph-Mcnc: 0.2 g/dL — ABNORMAL HIGH
Total Protein: 5.6 g/dL — ABNORMAL LOW (ref 6.0–8.5)

## 2017-01-09 ENCOUNTER — Other Ambulatory Visit (HOSPITAL_BASED_OUTPATIENT_CLINIC_OR_DEPARTMENT_OTHER): Payer: 59

## 2017-01-09 ENCOUNTER — Ambulatory Visit (HOSPITAL_BASED_OUTPATIENT_CLINIC_OR_DEPARTMENT_OTHER): Payer: 59

## 2017-01-09 ENCOUNTER — Encounter: Payer: Self-pay | Admitting: Hematology and Oncology

## 2017-01-09 ENCOUNTER — Other Ambulatory Visit: Payer: Self-pay

## 2017-01-09 ENCOUNTER — Ambulatory Visit (HOSPITAL_BASED_OUTPATIENT_CLINIC_OR_DEPARTMENT_OTHER): Payer: 59 | Admitting: Hematology and Oncology

## 2017-01-09 VITALS — BP 115/66 | HR 80 | Temp 98.5°F | Resp 16

## 2017-01-09 DIAGNOSIS — Z95828 Presence of other vascular implants and grafts: Secondary | ICD-10-CM

## 2017-01-09 DIAGNOSIS — G8929 Other chronic pain: Secondary | ICD-10-CM

## 2017-01-09 DIAGNOSIS — M549 Dorsalgia, unspecified: Secondary | ICD-10-CM | POA: Diagnosis not present

## 2017-01-09 DIAGNOSIS — C9002 Multiple myeloma in relapse: Secondary | ICD-10-CM

## 2017-01-09 DIAGNOSIS — Z5112 Encounter for antineoplastic immunotherapy: Secondary | ICD-10-CM | POA: Diagnosis not present

## 2017-01-09 DIAGNOSIS — D801 Nonfamilial hypogammaglobulinemia: Secondary | ICD-10-CM

## 2017-01-09 LAB — CBC WITH DIFFERENTIAL/PLATELET
BASO%: 0.8 % (ref 0.0–2.0)
Basophils Absolute: 0 10*3/uL (ref 0.0–0.1)
EOS%: 0.1 % (ref 0.0–7.0)
Eosinophils Absolute: 0 10*3/uL (ref 0.0–0.5)
HEMATOCRIT: 36.2 % (ref 34.8–46.6)
HGB: 12 g/dL (ref 11.6–15.9)
LYMPH#: 0.8 10*3/uL — AB (ref 0.9–3.3)
LYMPH%: 16.3 % (ref 14.0–49.7)
MCH: 33.4 pg (ref 25.1–34.0)
MCHC: 33.3 g/dL (ref 31.5–36.0)
MCV: 100.2 fL (ref 79.5–101.0)
MONO#: 1.4 10*3/uL — ABNORMAL HIGH (ref 0.1–0.9)
MONO%: 29.3 % — ABNORMAL HIGH (ref 0.0–14.0)
NEUT#: 2.6 10*3/uL (ref 1.5–6.5)
NEUT%: 53.5 % (ref 38.4–76.8)
Platelets: 158 10*3/uL (ref 145–400)
RBC: 3.61 10*6/uL — ABNORMAL LOW (ref 3.70–5.45)
RDW: 14.3 % (ref 11.2–14.5)
WBC: 4.8 10*3/uL (ref 3.9–10.3)

## 2017-01-09 LAB — COMPREHENSIVE METABOLIC PANEL
ALBUMIN: 3.6 g/dL (ref 3.5–5.0)
ALK PHOS: 46 U/L (ref 40–150)
ALT: 15 U/L (ref 0–55)
ANION GAP: 6 meq/L (ref 3–11)
AST: 18 U/L (ref 5–34)
BILIRUBIN TOTAL: 0.35 mg/dL (ref 0.20–1.20)
BUN: 16.8 mg/dL (ref 7.0–26.0)
CO2: 26 mEq/L (ref 22–29)
Calcium: 8.6 mg/dL (ref 8.4–10.4)
Chloride: 107 mEq/L (ref 98–109)
Creatinine: 0.7 mg/dL (ref 0.6–1.1)
GLUCOSE: 91 mg/dL (ref 70–140)
Potassium: 3.8 mEq/L (ref 3.5–5.1)
Sodium: 139 mEq/L (ref 136–145)
TOTAL PROTEIN: 5.9 g/dL — AB (ref 6.4–8.3)

## 2017-01-09 MED ORDER — ZOLEDRONIC ACID 4 MG/100ML IV SOLN
4.0000 mg | Freq: Once | INTRAVENOUS | Status: AC
  Administered 2017-01-09: 4 mg via INTRAVENOUS
  Filled 2017-01-09: qty 100

## 2017-01-09 MED ORDER — SODIUM CHLORIDE 0.9 % IV SOLN
Freq: Once | INTRAVENOUS | Status: AC
  Administered 2017-01-09: 09:00:00 via INTRAVENOUS

## 2017-01-09 MED ORDER — PROCHLORPERAZINE MALEATE 10 MG PO TABS
ORAL_TABLET | ORAL | Status: AC
  Filled 2017-01-09: qty 1

## 2017-01-09 MED ORDER — SODIUM CHLORIDE 0.9% FLUSH
10.0000 mL | INTRAVENOUS | Status: DC | PRN
  Administered 2017-01-09: 10 mL
  Filled 2017-01-09: qty 10

## 2017-01-09 MED ORDER — POMALIDOMIDE 3 MG PO CAPS: ORAL_CAPSULE | ORAL | 11 refills | Status: DC

## 2017-01-09 MED ORDER — HEPARIN SOD (PORK) LOCK FLUSH 100 UNIT/ML IV SOLN
500.0000 [IU] | Freq: Once | INTRAVENOUS | Status: AC | PRN
  Administered 2017-01-09: 500 [IU]
  Filled 2017-01-09: qty 5

## 2017-01-09 MED ORDER — SODIUM CHLORIDE 0.9 % IV SOLN
900.0000 mg | Freq: Once | INTRAVENOUS | Status: AC
  Administered 2017-01-09: 900 mg via INTRAVENOUS
  Filled 2017-01-09: qty 5

## 2017-01-09 MED ORDER — ACETAMINOPHEN 325 MG PO TABS
ORAL_TABLET | ORAL | Status: AC
  Filled 2017-01-09: qty 2

## 2017-01-09 MED ORDER — DIPHENHYDRAMINE HCL 25 MG PO CAPS
ORAL_CAPSULE | ORAL | Status: AC
  Filled 2017-01-09: qty 2

## 2017-01-09 MED ORDER — DIPHENHYDRAMINE HCL 25 MG PO CAPS
50.0000 mg | ORAL_CAPSULE | Freq: Once | ORAL | Status: AC
  Administered 2017-01-09: 50 mg via ORAL

## 2017-01-09 MED ORDER — ACETAMINOPHEN 325 MG PO TABS
650.0000 mg | ORAL_TABLET | Freq: Once | ORAL | Status: AC
  Administered 2017-01-09: 650 mg via ORAL

## 2017-01-09 MED ORDER — SODIUM CHLORIDE 0.9 % IV SOLN
20.0000 mg | Freq: Once | INTRAVENOUS | Status: AC
  Administered 2017-01-09: 20 mg via INTRAVENOUS
  Filled 2017-01-09: qty 2

## 2017-01-09 MED ORDER — PROCHLORPERAZINE MALEATE 10 MG PO TABS
10.0000 mg | ORAL_TABLET | Freq: Once | ORAL | Status: AC
  Administered 2017-01-09: 10 mg via ORAL

## 2017-01-09 NOTE — Assessment & Plan Note (Signed)
Her back pain is drastically improved She will continue close follow-up at the pain clinic. She will continue calcium and vitamin D.

## 2017-01-09 NOTE — Patient Instructions (Signed)
[facility] Discharge Instructions for Patients Receiving Chemotherapy  Today you received the following chemotherapy agents: Darzalex  To help prevent nausea and vomiting after your treatment, we encourage you to take your nausea medication as directed.    If you develop nausea and vomiting that is not controlled by your nausea medication, call the clinic.   BELOW ARE SYMPTOMS THAT SHOULD BE REPORTED IMMEDIATELY:  *FEVER GREATER THAN 100.5 F  *CHILLS WITH OR WITHOUT FEVER  NAUSEA AND VOMITING THAT IS NOT CONTROLLED WITH YOUR NAUSEA MEDICATION  *UNUSUAL SHORTNESS OF BREATH  *UNUSUAL BRUISING OR BLEEDING  TENDERNESS IN MOUTH AND THROAT WITH OR WITHOUT PRESENCE OF ULCERS  *URINARY PROBLEMS  *BOWEL PROBLEMS  UNUSUAL RASH Items with * indicate a potential emergency and should be followed up as soon as possible.  Feel free to call the clinic should you have any questions or concerns. The clinic phone number is [phone].  Please show the CHEMO ALERT CARD at check-in to the Emergency Department and triage nurse.   

## 2017-01-09 NOTE — Assessment & Plan Note (Signed)
I have reviewed recommendation from [facility] The patient is doing well and remain asymptomatic Even though the total serum light chain is elevated, the light chain ratio remains improved We discussed the risk and benefit of changing treatment versus increasing the dose of Pomalyst Ultimately, she is interested to increase the dose of Pomalyst to 3 mg, to be taken daily for 21 days with 7 days off We will continue Daratumumab as scheduled along with weekly dexamethasone She will continue calcium with vitamin D and Zometa every 6 months She will continue prophylactic treatment with acyclovir and aspirin

## 2017-01-09 NOTE — Assessment & Plan Note (Signed)
She is not symptomatic with infection I recommend we defer colonoscopy

## 2017-01-09 NOTE — Progress Notes (Signed)
[facility] OFFICE PROGRESS NOTE  Patient Care Team: [name], MD as PCP - General [name], MD as Consulting Physician (Oncology) [name], MD as Consulting Physician (Neurosurgery) [name], MD as Consulting Physician (Hematology and Oncology) [name], [name], MD as Referring Physician (Hematology and Oncology)  SUMMARY OF ONCOLOGIC HISTORY:   Multiple myeloma in relapse [facility])   07/21/2007 Bone Marrow Biopsy    Case #: [id number] Bone marrow showed myeloma      07/21/2007 Miscellaneous    She was diagnosed in May 2009. 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 Treatment is stopped due to minimum response and pancytopenia      10/19/2015 -  Chemotherapy    The patient had Velcade for chemotherapy treatment along with weekly Daratumumab. Last dose of Velcade on 02/29/16, stopped due to progression. Pomalidomide added on 03/14/16      04/11/2016 Adverse Reaction    Delay resumption of Pomalyst cycle 2 until 04/16/16 due to neutropenia      09/16/2016 PET scan    1. No findings of active bony lymphoma. Prior activity in the sternum and thoracic spine has resolved. 2. Focal hypermetabolic activity along the sigmoid colon with some local inflammatory stranding  in the adjacent mesentery, maximum SUV 11.0. This is probably from mild acute diverticulitis. There is no hypermetabolic activity in this vicinity 4 months ago to suggest that this is from colon cancer. Correlate with any symptoms. 3. Acute right maxillary sinusitis. 4. Thoracolumbar compression fractures, vertebral augmentation at the L1 level.      09/17/2016 Procedure    CT guided bone marrow biopsy of right posterior iliac bone with both aspirate and core samples obtained.      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 - TRILINEAGE HEMATOPOIESIS. - SEE COMMENT. PERIPHERAL BLOOD: - LYMPHOPENIA. - THROMBOCYTOPENIA. Diagnosis Note The bone marrow is slightly hypocellular for age with trilineage hematopoiesis and non-specific myeloid changes likely related to previous therapy. Plasma cells are increased in number representing 8% of all cells in the aspirate, but with lack of large aggregates or sheets in the clot and biopsy sections. Immunohistochemical stains highlight the slightly increased plasma cell component in the marrow which shows kappa light chain restriction consistent with residual/recurrent plasma cell neoplasm. Correlation with cytogenetic and FISH studies recommended      09/17/2016 Pathology Results    Normal bone marrow cytogenetics and FISH       INTERVAL HISTORY: Please see below for problem oriented charting. She returns for further  follow-up She denies recent infection She continues to have mild musculoskeletal pain and intermittent abdominal discomfort No changes in bowel habits No recent infection The patient denies any recent signs or symptoms of bleeding such as spontaneous epistaxis, hematuria or hematochezia.  REVIEW OF SYSTEMS:   Constitutional: Denies fevers, chills or abnormal weight loss Eyes: Denies blurriness of vision Ears, nose,  mouth, throat, and face: Denies mucositis or sore throat Respiratory: Denies cough, dyspnea or wheezes Cardiovascular: Denies palpitation, chest discomfort or lower extremity swelling Gastrointestinal:  Denies nausea, heartburn or change in bowel habits Skin: Denies abnormal skin rashes Lymphatics: Denies new lymphadenopathy or easy bruising Neurological:Denies numbness, tingling or new weaknesses Behavioral/Psych: Mood is stable, no new changes  All other systems were reviewed with the patient and are negative.  I have reviewed the past medical history, past surgical history, social history and family history with the patient and they are unchanged from previous note.  ALLERGIES:  is allergic to hydromorphone.  MEDICATIONS:  Current Outpatient Prescriptions  Medication Sig Dispense Refill  . acyclovir (ZOVIRAX) 400 MG tablet Take 1 tablet (400 mg total) by mouth 2 (two) times daily. 60 tablet 11  . aspirin 81 MG tablet Take 81 mg by mouth.    . cholecalciferol (VITAMIN D) 1000 UNITS tablet Take 1,000 Units by mouth daily.    Marland Kitchen dexamethasone (DECADRON) 4 MG tablet Take 5 tabs once a week with breakfast every Tuesdays 90 tablet 1  . Multiple Vitamins-Minerals (ONE-A-DAY 50 PLUS PO) Take by mouth daily.    Marland Kitchen oxycodone (OXY-IR) 5 MG capsule Take 5 mg by mouth every 4 (four) hours as needed. pain    . Oxycodone HCl (OXYCONTIN) 60 MG TB12 Take 1 tablet by mouth 2 (two) times daily.    . pantoprazole (PROTONIX) 40 MG tablet Take 1 tablet (40 mg total) by mouth daily. 30 tablet 9  . pomalidomide (POMALYST) 3 MG capsule Take with water on days 1-21. Repeat every 28 days. 21 capsule 11  . POMALYST 2 MG capsule TAKE 1 CAPSULE (2MG) BY MOUTH ONCE DAILY ON DAYS 1-21, REPEAT EVERY 28DAYS. 21 capsule 11   No current facility-administered medications for this visit.    Facility-Administered Medications Ordered in Other Visits  Medication Dose Route Frequency Provider Last Rate Last Dose  .  daratumumab (DARZALEX) 900 mg in sodium chloride 0.9 % 455 mL chemo infusion  900 mg Intravenous Once Alvy Bimler, Lashaunta Sicard, MD      . heparin lock flush 100 unit/mL  500 Units Intracatheter Once PRN Alvy Bimler, Jacobus Colvin, MD      . sodium chloride flush (NS) 0.9 % injection 10 mL  10 mL Intracatheter PRN Alvy Bimler, Genny Caulder, MD        PHYSICAL EXAMINATION: ECOG PERFORMANCE STATUS: 0 - Asymptomatic GENERAL:alert, no distress and comfortable SKIN: skin color, texture, turgor are normal, no rashes or significant lesions EYES: normal, Conjunctiva are pink and non-injected, sclera clear OROPHARYNX:no exudate, no erythema and lips, buccal mucosa, and tongue normal  NECK: supple, thyroid normal size, non-tender, without nodularity LYMPH:  no palpable lymphadenopathy in the cervical, axillary or inguinal LUNGS: clear to auscultation and percussion with normal breathing effort HEART: regular rate & rhythm and no murmurs and no lower extremity edema ABDOMEN:abdomen soft, non-tender and normal bowel sounds Musculoskeletal:no cyanosis of digits and no clubbing  NEURO: alert & oriented x 3 with fluent speech, no focal motor/sensory deficits  LABORATORY DATA:  I have reviewed the data as listed    Component Value Date/Time  NA 139 01/09/2017 0843   K 3.8 01/09/2017 0843   CL 107 08/30/2012 1055   CO2 26 01/09/2017 0843   GLUCOSE 91 01/09/2017 0843   GLUCOSE 102 (H) 08/30/2012 1055   BUN 16.8 01/09/2017 0843   CREATININE 0.7 01/09/2017 0843   CALCIUM 8.6 01/09/2017 0843   PROT 5.9 (L) 01/09/2017 0843   ALBUMIN 3.6 01/09/2017 0843   AST 18 01/09/2017 0843   ALT 15 01/09/2017 0843   ALKPHOS 46 01/09/2017 0843   BILITOT 0.35 01/09/2017 0843    No results found for: SPEP, UPEP  Lab Results  Component Value Date   WBC 4.8 01/09/2017   NEUTROABS 2.6 01/09/2017   HGB 12.0 01/09/2017   HCT 36.2 01/09/2017   MCV 100.2 01/09/2017   PLT 158 01/09/2017      Chemistry      Component Value Date/Time   NA 139  01/09/2017 0843   K 3.8 01/09/2017 0843   CL 107 08/30/2012 1055   CO2 26 01/09/2017 0843   BUN 16.8 01/09/2017 0843   CREATININE 0.7 01/09/2017 0843      Component Value Date/Time   CALCIUM 8.6 01/09/2017 0843   ALKPHOS 46 01/09/2017 0843   AST 18 01/09/2017 0843   ALT 15 01/09/2017 0843   BILITOT 0.35 01/09/2017 0843      ASSESSMENT & PLAN:  Multiple myeloma in relapse ([place]) I have reviewed recommendation from [facility] The patient is doing well and remain asymptomatic Even though the total serum light chain is elevated, the light chain ratio remains improved We discussed the risk and benefit of changing treatment versus increasing the dose of Pomalyst Ultimately, she is interested to increase the dose of Pomalyst to 3 mg, to be taken daily for 21 days with 7 days off We will continue Daratumumab as scheduled along with weekly dexamethasone She will continue calcium with vitamin D and Zometa every 6 months She will continue prophylactic treatment with acyclovir and aspirin  Hypogammaglobulinemia, acquired She is not symptomatic with infection I recommend we defer colonoscopy  Chronic back pain greater than 3 months duration Her back pain is drastically improved She will continue close follow-up at the pain clinic. She will continue calcium and vitamin D.   No orders of the defined types were placed in this encounter.  All questions were answered. The patient knows to call the clinic with any problems, questions or concerns. No barriers to learning was detected. I spent 15 minutes counseling the patient face to face. The total time spent in the appointment was 20 minutes and more than 50% was on counseling and review of test results     [name], MD 01/09/2017 10:54 AM

## 2017-01-12 ENCOUNTER — Telehealth: Payer: Self-pay | Admitting: Hematology and Oncology

## 2017-01-12 ENCOUNTER — Telehealth: Payer: Self-pay | Admitting: *Deleted

## 2017-01-12 ENCOUNTER — Telehealth: Payer: Self-pay

## 2017-01-12 NOTE — Telephone Encounter (Signed)
Spoke with Jessica Cochran. They think she might have not felt well due to the zometa. Is feeling better today. Wants Dr Alvy Bimler to talk with Diane about the intermittent pain in her side. He will bring this up at next appt.

## 2017-01-12 NOTE — Telephone Encounter (Signed)
Husband called. Pt had treatment on Friday and did not feel too good over the weekend. He is asking Dr Alvy Bimler revisit pt c/o pain in her side. Pt mentions the pain ever other day or so, he could not quantify this. He is not saying they necessarily need to see Dr Alvy Bimler before pt's 11/30 MD appt but that she look back over some stuff and ask pt more indepth questions.   Carl's cell # [phone]

## 2017-01-12 NOTE — Telephone Encounter (Signed)
Can you call [name] and ask if the pain or her not feeling well related to Zometa? I remember in the past she avoided ZOmeta same day of treatment because it usually cause her not to feel well. Some patients take Claritin for a few days for bone ache

## 2017-01-12 NOTE — Telephone Encounter (Signed)
Called patient regarding current schedule °

## 2017-01-22 ENCOUNTER — Other Ambulatory Visit: Payer: Self-pay | Admitting: Hematology and Oncology

## 2017-01-22 NOTE — Telephone Encounter (Signed)
She should be taking Pomalyst 3 mg now Please make sure she has 3 mg prescription

## 2017-01-23 ENCOUNTER — Ambulatory Visit (HOSPITAL_BASED_OUTPATIENT_CLINIC_OR_DEPARTMENT_OTHER): Payer: 59

## 2017-01-23 ENCOUNTER — Other Ambulatory Visit (HOSPITAL_BASED_OUTPATIENT_CLINIC_OR_DEPARTMENT_OTHER): Payer: 59

## 2017-01-23 VITALS — BP 108/72 | HR 90 | Temp 98.1°F | Resp 16 | Wt 130.8 lb

## 2017-01-23 DIAGNOSIS — Z5112 Encounter for antineoplastic immunotherapy: Secondary | ICD-10-CM

## 2017-01-23 DIAGNOSIS — C9002 Multiple myeloma in relapse: Secondary | ICD-10-CM

## 2017-01-23 DIAGNOSIS — C9001 Multiple myeloma in remission: Secondary | ICD-10-CM

## 2017-01-23 LAB — CBC WITH DIFFERENTIAL/PLATELET
BASO%: 1.2 % (ref 0.0–2.0)
BASOS ABS: 0.1 10*3/uL (ref 0.0–0.1)
EOS%: 8.2 % — AB (ref 0.0–7.0)
Eosinophils Absolute: 0.6 10*3/uL — ABNORMAL HIGH (ref 0.0–0.5)
HCT: 38.2 % (ref 34.8–46.6)
HEMOGLOBIN: 12.5 g/dL (ref 11.6–15.9)
LYMPH%: 23.5 % (ref 14.0–49.7)
MCH: 33.1 pg (ref 25.1–34.0)
MCHC: 32.7 g/dL (ref 31.5–36.0)
MCV: 101.1 fL — ABNORMAL HIGH (ref 79.5–101.0)
MONO#: 1.3 10*3/uL — ABNORMAL HIGH (ref 0.1–0.9)
MONO%: 18.2 % — AB (ref 0.0–14.0)
NEUT#: 3.4 10*3/uL (ref 1.5–6.5)
NEUT%: 48.9 % (ref 38.4–76.8)
Platelets: 186 10*3/uL (ref 145–400)
RBC: 3.78 10*6/uL (ref 3.70–5.45)
RDW: 13.8 % (ref 11.2–14.5)
WBC: 6.9 10*3/uL (ref 3.9–10.3)
lymph#: 1.6 10*3/uL (ref 0.9–3.3)

## 2017-01-23 LAB — COMPREHENSIVE METABOLIC PANEL
ALT: 14 U/L (ref 0–55)
AST: 17 U/L (ref 5–34)
Albumin: 3.9 g/dL (ref 3.5–5.0)
Alkaline Phosphatase: 50 U/L (ref 40–150)
Anion Gap: 10 mEq/L (ref 3–11)
BUN: 15.6 mg/dL (ref 7.0–26.0)
CHLORIDE: 105 meq/L (ref 98–109)
CO2: 24 mEq/L (ref 22–29)
Calcium: 8.9 mg/dL (ref 8.4–10.4)
Creatinine: 0.8 mg/dL (ref 0.6–1.1)
EGFR: >60 mL/min/{1.73_m2} (ref >60)
GLUCOSE: 86 mg/dL (ref 70–140)
POTASSIUM: 3.8 meq/L (ref 3.5–5.1)
SODIUM: 139 meq/L (ref 136–145)
Total Bilirubin: 0.57 mg/dL (ref 0.20–1.20)
Total Protein: 6 g/dL — ABNORMAL LOW (ref 6.4–8.3)

## 2017-01-23 MED ORDER — PROCHLORPERAZINE MALEATE 10 MG PO TABS
10.0000 mg | ORAL_TABLET | Freq: Once | ORAL | Status: AC
  Administered 2017-01-23: 10 mg via ORAL

## 2017-01-23 MED ORDER — PROCHLORPERAZINE MALEATE 10 MG PO TABS
ORAL_TABLET | ORAL | Status: AC
  Filled 2017-01-23: qty 1

## 2017-01-23 MED ORDER — SODIUM CHLORIDE 0.9 % IV SOLN
20.0000 mg | Freq: Once | INTRAVENOUS | Status: AC
  Administered 2017-01-23: 20 mg via INTRAVENOUS
  Filled 2017-01-23: qty 2

## 2017-01-23 MED ORDER — SODIUM CHLORIDE 0.9 % IV SOLN
Freq: Once | INTRAVENOUS | Status: AC
  Administered 2017-01-23: 10:00:00 via INTRAVENOUS

## 2017-01-23 MED ORDER — DIPHENHYDRAMINE HCL 25 MG PO CAPS
ORAL_CAPSULE | ORAL | Status: AC
  Filled 2017-01-23: qty 2

## 2017-01-23 MED ORDER — HEPARIN SOD (PORK) LOCK FLUSH 100 UNIT/ML IV SOLN
500.0000 [IU] | Freq: Once | INTRAVENOUS | Status: AC | PRN
  Administered 2017-01-23: 500 [IU]
  Filled 2017-01-23: qty 5

## 2017-01-23 MED ORDER — SODIUM CHLORIDE 0.9 % IV SOLN
15.3000 mg/kg | Freq: Once | INTRAVENOUS | Status: AC
  Administered 2017-01-23: 900 mg via INTRAVENOUS
  Filled 2017-01-23: qty 5

## 2017-01-23 MED ORDER — ACETAMINOPHEN 325 MG PO TABS
650.0000 mg | ORAL_TABLET | Freq: Once | ORAL | Status: AC
  Administered 2017-01-23: 650 mg via ORAL

## 2017-01-23 MED ORDER — DIPHENHYDRAMINE HCL 25 MG PO CAPS
50.0000 mg | ORAL_CAPSULE | Freq: Once | ORAL | Status: AC
  Administered 2017-01-23: 50 mg via ORAL

## 2017-01-23 MED ORDER — ACETAMINOPHEN 325 MG PO TABS
ORAL_TABLET | ORAL | Status: AC
  Filled 2017-01-23: qty 2

## 2017-01-23 MED ORDER — SODIUM CHLORIDE 0.9% FLUSH
10.0000 mL | INTRAVENOUS | Status: DC | PRN
  Administered 2017-01-23: 10 mL
  Filled 2017-01-23: qty 10

## 2017-01-23 NOTE — Progress Notes (Signed)
Charted in error.

## 2017-01-23 NOTE — Patient Instructions (Signed)
[facility] Discharge Instructions for Patients Receiving Chemotherapy  Today you received the following chemotherapy agents: Darzalex  To help prevent nausea and vomiting after your treatment, we encourage you to take your nausea medication as directed.    If you develop nausea and vomiting that is not controlled by your nausea medication, call the clinic.   BELOW ARE SYMPTOMS THAT SHOULD BE REPORTED IMMEDIATELY:  *FEVER GREATER THAN 100.5 F  *CHILLS WITH OR WITHOUT FEVER  NAUSEA AND VOMITING THAT IS NOT CONTROLLED WITH YOUR NAUSEA MEDICATION  *UNUSUAL SHORTNESS OF BREATH  *UNUSUAL BRUISING OR BLEEDING  TENDERNESS IN MOUTH AND THROAT WITH OR WITHOUT PRESENCE OF ULCERS  *URINARY PROBLEMS  *BOWEL PROBLEMS  UNUSUAL RASH Items with * indicate a potential emergency and should be followed up as soon as possible.  Feel free to call the clinic should you have any questions or concerns. The clinic phone number is [phone].  Please show the CHEMO ALERT CARD at check-in to the Emergency Department and triage nurse.   

## 2017-01-26 LAB — KAPPA/LAMBDA LIGHT CHAINS
IG KAPPA FREE LIGHT CHAIN: 130.3 mg/L — AB (ref 3.3–19.4)
Ig Lambda Free Light Chain: 2.7 mg/L — ABNORMAL LOW (ref 5.7–26.3)
Kappa/Lambda FluidC Ratio: 48.26 — ABNORMAL HIGH (ref 0.26–1.65)

## 2017-01-27 LAB — MULTIPLE MYELOMA PANEL, SERUM
ALBUMIN/GLOB SERPL: 1.8 — AB (ref 0.7–1.7)
ALPHA2 GLOB SERPL ELPH-MCNC: 0.7 g/dL (ref 0.4–1.0)
Albumin SerPl Elph-Mcnc: 3.7 g/dL (ref 2.9–4.4)
Alpha 1: 0.3 g/dL (ref 0.0–0.4)
B-Globulin SerPl Elph-Mcnc: 0.8 g/dL (ref 0.7–1.3)
Gamma Glob SerPl Elph-Mcnc: 0.4 g/dL (ref 0.4–1.8)
Globulin, Total: 2.1 g/dL — ABNORMAL LOW (ref 2.2–3.9)
IGG (IMMUNOGLOBIN G), SERUM: 326 mg/dL — AB (ref 700–1600)
IGM (IMMUNOGLOBIN M), SRM: 11 mg/dL — AB (ref 26–217)
IgA, Qn, Serum: 6 mg/dL — ABNORMAL LOW (ref 87–352)
M Protein SerPl Elph-Mcnc: 0.1 g/dL — ABNORMAL HIGH
Total Protein: 5.8 g/dL — ABNORMAL LOW (ref 6.0–8.5)

## 2017-01-28 ENCOUNTER — Telehealth: Payer: Self-pay | Admitting: *Deleted

## 2017-01-28 ENCOUNTER — Other Ambulatory Visit: Payer: Self-pay | Admitting: *Deleted

## 2017-01-28 MED ORDER — POMALIDOMIDE 3 MG PO CAPS: ORAL_CAPSULE | ORAL | 11 refills | Status: DC

## 2017-01-28 NOTE — Telephone Encounter (Signed)
LM with note below 

## 2017-01-28 NOTE — Telephone Encounter (Signed)
-----   Message from [name], MD sent at 01/27/2017  4:41 PM EST ----- Regarding: myleoma panel pls let her know/Carl the light chains are worse but we just recently increased the dose. I recommend we stay on course with increased dose pomalyst. I will discuss further plan of care when I see them back next week ----- Message ----- From: Interface, Lab In Three Zero One Sent: 01/23/2017   9:49 AM To: [name], MD

## 2017-01-28 NOTE — Telephone Encounter (Signed)
Diane called to let us know she has a "respiratory infection".  States on Monday throat was sore, Tuesday back of throat was red and she coughed all night, Wednesday is achy and not sure if she has any fever. Has been taking tylenol. Offered for her to come into see NP in [facility], but she states she is going to 'try to beat it at home". Reminded that we will be closed on Thursday. No [facility] on Friday and Dr Alvy Bimler will be out of the office on Friday.

## 2017-02-06 ENCOUNTER — Encounter: Payer: Self-pay | Admitting: Hematology and Oncology

## 2017-02-06 ENCOUNTER — Ambulatory Visit (HOSPITAL_BASED_OUTPATIENT_CLINIC_OR_DEPARTMENT_OTHER): Payer: 59

## 2017-02-06 ENCOUNTER — Telehealth: Payer: Self-pay

## 2017-02-06 ENCOUNTER — Ambulatory Visit (HOSPITAL_BASED_OUTPATIENT_CLINIC_OR_DEPARTMENT_OTHER): Payer: 59 | Admitting: Hematology and Oncology

## 2017-02-06 ENCOUNTER — Other Ambulatory Visit (HOSPITAL_BASED_OUTPATIENT_CLINIC_OR_DEPARTMENT_OTHER): Payer: 59

## 2017-02-06 VITALS — BP 108/77 | HR 84 | Temp 99.0°F | Resp 18

## 2017-02-06 DIAGNOSIS — C9002 Multiple myeloma in relapse: Secondary | ICD-10-CM | POA: Diagnosis not present

## 2017-02-06 DIAGNOSIS — Z5112 Encounter for antineoplastic immunotherapy: Secondary | ICD-10-CM | POA: Diagnosis not present

## 2017-02-06 DIAGNOSIS — D801 Nonfamilial hypogammaglobulinemia: Secondary | ICD-10-CM | POA: Diagnosis not present

## 2017-02-06 DIAGNOSIS — R042 Hemoptysis: Secondary | ICD-10-CM | POA: Diagnosis not present

## 2017-02-06 DIAGNOSIS — J189 Pneumonia, unspecified organism: Secondary | ICD-10-CM

## 2017-02-06 DIAGNOSIS — J181 Lobar pneumonia, unspecified organism: Secondary | ICD-10-CM

## 2017-02-06 LAB — CBC WITH DIFFERENTIAL/PLATELET
BASO%: 0.2 % (ref 0.0–2.0)
Basophils Absolute: 0 10*3/uL (ref 0.0–0.1)
EOS%: 0 % (ref 0.0–7.0)
Eosinophils Absolute: 0 10*3/uL (ref 0.0–0.5)
HCT: 36.5 % (ref 34.8–46.6)
HEMOGLOBIN: 11.9 g/dL (ref 11.6–15.9)
LYMPH%: 16 % (ref 14.0–49.7)
MCH: 32.6 pg (ref 25.1–34.0)
MCHC: 32.6 g/dL (ref 31.5–36.0)
MCV: 100 fL (ref 79.5–101.0)
MONO#: 0.7 10*3/uL (ref 0.1–0.9)
MONO%: 10.9 % (ref 0.0–14.0)
NEUT%: 72.9 % (ref 38.4–76.8)
NEUTROS ABS: 4.3 10*3/uL (ref 1.5–6.5)
Platelets: 256 10*3/uL (ref 145–400)
RBC: 3.65 10*6/uL — ABNORMAL LOW (ref 3.70–5.45)
RDW: 13.2 % (ref 11.2–14.5)
WBC: 5.9 10*3/uL (ref 3.9–10.3)
lymph#: 1 10*3/uL (ref 0.9–3.3)

## 2017-02-06 LAB — COMPREHENSIVE METABOLIC PANEL
ALBUMIN: 3.4 g/dL — AB (ref 3.5–5.0)
ALK PHOS: 55 U/L (ref 40–150)
ALT: 14 U/L (ref 0–55)
AST: 17 U/L (ref 5–34)
Anion Gap: 9 mEq/L (ref 3–11)
BILIRUBIN TOTAL: 0.23 mg/dL (ref 0.20–1.20)
BUN: 12.6 mg/dL (ref 7.0–26.0)
CO2: 25 mEq/L (ref 22–29)
CREATININE: 0.7 mg/dL (ref 0.6–1.1)
Calcium: 9.4 mg/dL (ref 8.4–10.4)
Chloride: 107 mEq/L (ref 98–109)
EGFR: >60 mL/min/{1.73_m2} (ref >60)
GLUCOSE: 101 mg/dL (ref 70–140)
Potassium: 4 mEq/L (ref 3.5–5.1)
SODIUM: 141 meq/L (ref 136–145)
TOTAL PROTEIN: 6.3 g/dL — AB (ref 6.4–8.3)

## 2017-02-06 MED ORDER — SODIUM CHLORIDE 0.9% FLUSH
10.0000 mL | INTRAVENOUS | Status: DC | PRN
  Administered 2017-02-06: 10 mL
  Filled 2017-02-06: qty 10

## 2017-02-06 MED ORDER — PROCHLORPERAZINE MALEATE 10 MG PO TABS
10.0000 mg | ORAL_TABLET | Freq: Once | ORAL | Status: AC
  Administered 2017-02-06: 10 mg via ORAL

## 2017-02-06 MED ORDER — DIPHENHYDRAMINE HCL 25 MG PO CAPS
50.0000 mg | ORAL_CAPSULE | Freq: Once | ORAL | Status: AC
  Administered 2017-02-06: 50 mg via ORAL

## 2017-02-06 MED ORDER — ACETAMINOPHEN 325 MG PO TABS
ORAL_TABLET | ORAL | Status: AC
  Filled 2017-02-06: qty 2

## 2017-02-06 MED ORDER — PROCHLORPERAZINE MALEATE 10 MG PO TABS
ORAL_TABLET | ORAL | Status: AC
  Filled 2017-02-06: qty 1

## 2017-02-06 MED ORDER — SODIUM CHLORIDE 0.9 % IV SOLN
Freq: Once | INTRAVENOUS | Status: AC
  Administered 2017-02-06: 11:00:00 via INTRAVENOUS

## 2017-02-06 MED ORDER — DARATUMUMAB CHEMO INJECTION 400 MG/20ML
15.3000 mg/kg | Freq: Once | INTRAVENOUS | Status: AC
  Administered 2017-02-06: 900 mg via INTRAVENOUS
  Filled 2017-02-06: qty 40

## 2017-02-06 MED ORDER — DIPHENHYDRAMINE HCL 25 MG PO CAPS
ORAL_CAPSULE | ORAL | Status: AC
  Filled 2017-02-06: qty 2

## 2017-02-06 MED ORDER — HEPARIN SOD (PORK) LOCK FLUSH 100 UNIT/ML IV SOLN
500.0000 [IU] | Freq: Once | INTRAVENOUS | Status: AC | PRN
  Administered 2017-02-06: 500 [IU]
  Filled 2017-02-06: qty 5

## 2017-02-06 MED ORDER — DEXAMETHASONE SODIUM PHOSPHATE 100 MG/10ML IJ SOLN
20.0000 mg | Freq: Once | INTRAMUSCULAR | Status: AC
  Administered 2017-02-06: 20 mg via INTRAVENOUS
  Filled 2017-02-06: qty 2

## 2017-02-06 MED ORDER — ACETAMINOPHEN 325 MG PO TABS
650.0000 mg | ORAL_TABLET | Freq: Once | ORAL | Status: AC
  Administered 2017-02-06: 650 mg via ORAL

## 2017-02-06 NOTE — Assessment & Plan Note (Signed)
She complains of recurrent hemoptysis Cough has resolved with antibiotic treatment Recommend close observation If she have recurrence of hemoptysis, recommend follow-up with CT scan of the chest She agreed with the plan of care

## 2017-02-06 NOTE — Progress Notes (Signed)
[place] OFFICE PROGRESS NOTE  Patient Care Team: [name], MD as PCP - General [name], MD as Consulting Physician (Oncology) [name], MD as Consulting Physician (Neurosurgery) [name], MD as Consulting Physician (Hematology and Oncology) [name], [name], MD as Referring Physician (Hematology and Oncology)  SUMMARY OF ONCOLOGIC HISTORY:   Multiple myeloma in relapse [facility])   07/21/2007 Bone Marrow Biopsy    Case #: [id number] Bone marrow showed myeloma      07/21/2007 Miscellaneous    She was diagnosed in May 2009. Had several cycles of RVD      10/14/2007 Bone Marrow Biopsy    Case #: [id number] repeat bone marrow biopsy showed good response to Rx      12/31/2007 Bone Marrow Transplant    She had autologous BMT at [facility]      07/19/2009 Bone Marrow Biopsy    Case #: [id number] Bone marrow biopsy showed only 1% plasma cells      10/10/2010 Bone Marrow Biopsy    [id number] Bone marrow biopsy showed 2% plasma cells      09/25/2011 Bone Marrow Biopsy    Accession #: [id number] Bone marrow only showed 4% plasma cells with ligh chain excess      10/14/2011 - 06/13/2013 Chemotherapy    She received Revlimid only, discontinued due to pancytopenia      06/18/2015 - 09/13/2015 Chemotherapy    She resumed taking Revlimid and dexamethasone. Treatment is stopped due to minimum response and pancytopenia      10/19/2015 -  Chemotherapy    The patient had Velcade for chemotherapy treatment along with weekly Daratumumab. Last dose of Velcade on 02/29/16, stopped due to progression. Pomalidomide added on 03/14/16      04/11/2016 Adverse Reaction    Delay resumption of Pomalyst cycle 2 until 04/16/16 due to neutropenia      09/16/2016 PET scan    1. No findings of active bony lymphoma. Prior activity in the sternum and thoracic spine has resolved. 2. Focal hypermetabolic activity along the sigmoid colon with some local inflammatory stranding  in the adjacent mesentery, maximum SUV 11.0. This is probably from mild acute diverticulitis. There is no hypermetabolic activity in this vicinity 4 months ago to suggest that this is from colon cancer. Correlate with any symptoms. 3. Acute right maxillary sinusitis. 4. Thoracolumbar compression fractures, vertebral augmentation at the L1 level.      09/17/2016 Procedure    CT guided bone marrow biopsy of right posterior iliac bone with both aspirate and core samples obtained.      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 - TRILINEAGE HEMATOPOIESIS. - SEE COMMENT. PERIPHERAL BLOOD: - LYMPHOPENIA. - THROMBOCYTOPENIA. Diagnosis Note The bone marrow is slightly hypocellular for age with trilineage hematopoiesis and non-specific myeloid changes likely related to previous therapy. Plasma cells are increased in number representing 8% of all cells in the aspirate, but with lack of large aggregates or sheets in the clot and biopsy sections. Immunohistochemical stains highlight the slightly increased plasma cell component in the marrow which shows kappa light chain restriction consistent with residual/recurrent plasma cell neoplasm. Correlation with cytogenetic and FISH studies recommended      09/17/2016 Pathology Results    Normal bone marrow cytogenetics and FISH       INTERVAL HISTORY: Please see below for problem oriented charting. She is seen in  the infusion room with her husband present She has returned to [facility] for further follow-up recently She started to have significant cough She then completed a course of antibiotic therapy with minor improvement Chest x-ray at [facility] show possible left lower lung pneumonia She is prescribed another course of antibiotic with azithromycin and finally she is getting better With her cough last week, she did had  hemoptysis Over the last 2 days, her cough has resolved She denies other forms of bleeding No fever or chills She denies worsening bone pain.  REVIEW OF SYSTEMS:   Constitutional: Denies fevers, chills or abnormal weight loss Eyes: Denies blurriness of vision Ears, nose, mouth, throat, and face: Denies mucositis or sore throat Cardiovascular: Denies palpitation, chest discomfort or lower extremity swelling Gastrointestinal:  Denies nausea, heartburn or change in bowel habits Skin: Denies abnormal skin rashes Lymphatics: Denies new lymphadenopathy or easy bruising Neurological:Denies numbness, tingling or new weaknesses Behavioral/Psych: Mood is stable, no new changes  All other systems were reviewed with the patient and are negative.  I have reviewed the past medical history, past surgical history, social history and family history with the patient and they are unchanged from previous note.  ALLERGIES:  is allergic to hydromorphone.  MEDICATIONS:  Current Outpatient Medications  Medication Sig Dispense Refill  . acyclovir (ZOVIRAX) 400 MG tablet Take 1 tablet (400 mg total) by mouth 2 (two) times daily. 60 tablet 11  . aspirin 81 MG tablet Take 81 mg by mouth.    . cholecalciferol (VITAMIN D) 1000 UNITS tablet Take 1,000 Units by mouth daily.    Marland Kitchen dexamethasone (DECADRON) 4 MG tablet Take 5 tabs once a week with breakfast every Tuesdays 90 tablet 1  . Multiple Vitamins-Minerals (ONE-A-DAY 50 PLUS PO) Take by mouth daily.    Marland Kitchen oxycodone (OXY-IR) 5 MG capsule Take 5 mg by mouth every 4 (four) hours as needed. pain    . Oxycodone HCl (OXYCONTIN) 60 MG TB12 Take 1 tablet by mouth 2 (two) times daily.    . pantoprazole (PROTONIX) 40 MG tablet Take 1 tablet (40 mg total) by mouth daily. 30 tablet 9  . pomalidomide (POMALYST) 3 MG capsule Take with water on days 1-21. Repeat every 28 days. 21 capsule 11   No current facility-administered medications for this visit.     Facility-Administered Medications Ordered in Other Visits  Medication Dose Route Frequency Provider Last Rate Last Dose  . sodium chloride flush (NS) 0.9 % injection 10 mL  10 mL Intracatheter PRN Alvy Bimler, Remona Boom, MD   10 mL at 02/06/17 1439    PHYSICAL EXAMINATION: ECOG PERFORMANCE STATUS: 1 - Symptomatic but completely ambulatory  There were no vitals filed for this visit. There were no vitals filed for this visit.  GENERAL:alert, no distress and comfortable SKIN: skin color, texture, turgor are normal, no rashes or significant lesions EYES: normal, Conjunctiva are pink and non-injected, sclera clear OROPHARYNX:no exudate, no erythema and lips, buccal mucosa, and tongue normal  NECK: supple, thyroid normal size, non-tender, without nodularity LYMPH:  no palpable lymphadenopathy in the cervical, axillary or inguinal LUNGS: clear to auscultation and percussion with normal breathing effort HEART: regular rate & rhythm and no murmurs and no lower extremity edema ABDOMEN:abdomen soft, non-tender and normal bowel sounds Musculoskeletal:no cyanosis of digits and no clubbing  NEURO: alert & oriented x 3 with fluent speech, no focal motor/sensory deficits  LABORATORY DATA:  I have reviewed the data as listed    Component  Value Date/Time   NA 141 02/06/2017 0841   K 4.0 02/06/2017 0841   CL 107 08/30/2012 1055   CO2 25 02/06/2017 0841   GLUCOSE 101 02/06/2017 0841   GLUCOSE 102 (H) 08/30/2012 1055   BUN 12.6 02/06/2017 0841   CREATININE 0.7 02/06/2017 0841   CALCIUM 9.4 02/06/2017 0841   PROT 6.3 (L) 02/06/2017 0841   ALBUMIN 3.4 (L) 02/06/2017 0841   AST 17 02/06/2017 0841   ALT 14 02/06/2017 0841   ALKPHOS 55 02/06/2017 0841   BILITOT 0.23 02/06/2017 0841    No results found for: SPEP, UPEP  Lab Results  Component Value Date   WBC 5.9 02/06/2017   NEUTROABS 4.3 02/06/2017   HGB 11.9 02/06/2017   HCT 36.5 02/06/2017   MCV 100.0 02/06/2017   PLT 256 02/06/2017       Chemistry      Component Value Date/Time   NA 141 02/06/2017 0841   K 4.0 02/06/2017 0841   CL 107 08/30/2012 1055   CO2 25 02/06/2017 0841   BUN 12.6 02/06/2017 0841   CREATININE 0.7 02/06/2017 0841      Component Value Date/Time   CALCIUM 9.4 02/06/2017 0841   ALKPHOS 55 02/06/2017 0841   AST 17 02/06/2017 0841   ALT 14 02/06/2017 0841   BILITOT 0.23 02/06/2017 0841       ASSESSMENT & PLAN:  Multiple myeloma in relapse ([place]) I have reviewed recommendation from [facility] and [facility] The patient is doing well and remain asymptomatic Her recent myeloma panel looks a little worse Ultimately, she is interested to increase the dose of Pomalyst to 3 mg, to be taken daily for 21 days with 7 days off  She just started the increased dose today If her repeat myeloma panel is worse, we can consider switching her over to Kyprolis, Cytoxan and dexamethasone Expected side effects from the regimen is briefly discussed We also discussed other treatment such as clinical trial We will continue Daratumumab as scheduled along with weekly dexamethasone She will continue calcium with vitamin D and Zometa every 6 months She will continue prophylactic treatment with acyclovir and aspirin  Pneumonia She was treated with 2 courses of antibiotics Chest x-ray evaluation at [facility] show possible left lower lobe atelectasis versus pneumonia Her cough is improving with antibiotic therapy I recommend she complete her course of treatment I recommend repeating chest x-ray in 6-8 weeks to ensure resolution  Hypogammaglobulinemia, acquired Patient is at risks of infection due to acquired hypogammaglobulinemia I agree with antibiotic plan as above  Hemoptysis She complains of recurrent hemoptysis Cough has resolved with antibiotic treatment Recommend close observation If she have recurrence of hemoptysis, recommend follow-up with CT scan of the chest She agreed  with the plan of care   Orders Placed This Encounter  Procedures  . Kappa/lambda light chains    Standing Status:   Future    Standing Expiration Date:   03/13/2018  . Multiple Myeloma Panel (SPEP&IFE w/QIG)    Standing Status:   Future    Standing Expiration Date:   03/13/2018   All questions were answered. The patient knows to call the clinic with any problems, questions or concerns. No barriers to learning was detected. I spent 20 minutes counseling the patient face to face. The total time spent in the appointment was 30 minutes and more than 50% was on counseling and review of test results     [name],  MD 02/06/2017 4:24 PM

## 2017-02-06 NOTE — Assessment & Plan Note (Signed)
I have reviewed recommendation from [facility] and [facility] The patient is doing well and remain asymptomatic Her recent myeloma panel looks a little worse Ultimately, she is interested to increase the dose of Pomalyst to 3 mg, to be taken daily for 21 days with 7 days off  She just started the increased dose today If her repeat myeloma panel is worse, we can consider switching her over to Kyprolis, Cytoxan and dexamethasone Expected side effects from the regimen is briefly discussed We also discussed other treatment such as clinical trial We will continue Daratumumab as scheduled along with weekly dexamethasone She will continue calcium with vitamin D and Zometa every 6 months She will continue prophylactic treatment with acyclovir and aspirin

## 2017-02-06 NOTE — Assessment & Plan Note (Signed)
She was treated with 2 courses of antibiotics Chest x-ray evaluation at [facility] show possible left lower lobe atelectasis versus pneumonia Her cough is improving with antibiotic therapy I recommend she complete her course of treatment I recommend repeating chest x-ray in 6-8 weeks to ensure resolution

## 2017-02-06 NOTE — Assessment & Plan Note (Signed)
Patient is at risks of infection due to acquired hypogammaglobulinemia I agree with antibiotic plan as above

## 2017-02-06 NOTE — Telephone Encounter (Signed)
Printed avs and calender for upcoming appointment. Per 11/30 los 

## 2017-02-06 NOTE — Patient Instructions (Signed)
[facility] Discharge Instructions for Patients Receiving Chemotherapy  Today you received the following chemotherapy agents: Darzalex  To help prevent nausea and vomiting after your treatment, we encourage you to take your nausea medication as directed.    If you develop nausea and vomiting that is not controlled by your nausea medication, call the clinic.   BELOW ARE SYMPTOMS THAT SHOULD BE REPORTED IMMEDIATELY:  *FEVER GREATER THAN 100.5 F  *CHILLS WITH OR WITHOUT FEVER  NAUSEA AND VOMITING THAT IS NOT CONTROLLED WITH YOUR NAUSEA MEDICATION  *UNUSUAL SHORTNESS OF BREATH  *UNUSUAL BRUISING OR BLEEDING  TENDERNESS IN MOUTH AND THROAT WITH OR WITHOUT PRESENCE OF ULCERS  *URINARY PROBLEMS  *BOWEL PROBLEMS  UNUSUAL RASH Items with * indicate a potential emergency and should be followed up as soon as possible.  Feel free to call the clinic should you have any questions or concerns. The clinic phone number is [phone].  Please show the CHEMO ALERT CARD at check-in to the Emergency Department and triage nurse.   

## 2017-02-10 ENCOUNTER — Telehealth: Payer: Self-pay | Admitting: Pharmacy Technician

## 2017-02-10 NOTE — Telephone Encounter (Signed)
Oral Oncology Patient Advocate Encounter  Prior Authorization Renewal for Pomalyst has been approved.    PA# [id number] Effective dates: 02/10/2017 through 02/10/2018  Oral Oncology Clinic will continue to follow.   Fabio Asa. Melynda Keller, [place] Patient [place] [phone] 02/10/2017 3:40 PM

## 2017-02-10 NOTE — Telephone Encounter (Addendum)
Oral Oncology Patient Advocate Encounter  Received notification from [employer] that prior authorization for Pomalyst is due for renewal.  PA submitted via fax.  [phone] Status is pending  Oral Oncology Clinic will continue to follow.  Fabio Asa. Melynda Keller, [place] Patient [place] [phone] 02/10/2017 9:23 AM

## 2017-02-19 ENCOUNTER — Other Ambulatory Visit: Payer: Self-pay | Admitting: Hematology and Oncology

## 2017-02-19 NOTE — Telephone Encounter (Signed)
Pls refill electronically °

## 2017-02-20 ENCOUNTER — Other Ambulatory Visit (HOSPITAL_BASED_OUTPATIENT_CLINIC_OR_DEPARTMENT_OTHER): Payer: 59

## 2017-02-20 ENCOUNTER — Ambulatory Visit (HOSPITAL_BASED_OUTPATIENT_CLINIC_OR_DEPARTMENT_OTHER): Payer: 59

## 2017-02-20 VITALS — BP 110/78 | HR 91 | Temp 98.8°F | Resp 16 | Wt 128.0 lb

## 2017-02-20 DIAGNOSIS — C9002 Multiple myeloma in relapse: Secondary | ICD-10-CM | POA: Diagnosis not present

## 2017-02-20 DIAGNOSIS — Z5112 Encounter for antineoplastic immunotherapy: Secondary | ICD-10-CM | POA: Diagnosis not present

## 2017-02-20 LAB — CBC WITH DIFFERENTIAL/PLATELET
BASO%: 0.4 % (ref 0.0–2.0)
BASOS ABS: 0 10*3/uL (ref 0.0–0.1)
EOS ABS: 0 10*3/uL (ref 0.0–0.5)
EOS%: 0.2 % (ref 0.0–7.0)
HCT: 38.7 % (ref 34.8–46.6)
HGB: 12.7 g/dL (ref 11.6–15.9)
LYMPH#: 0.3 10*3/uL — AB (ref 0.9–3.3)
LYMPH%: 8.5 % — AB (ref 14.0–49.7)
MCH: 32.6 pg (ref 25.1–34.0)
MCHC: 32.8 g/dL (ref 31.5–36.0)
MCV: 99.5 fL (ref 79.5–101.0)
MONO#: 0 10*3/uL — AB (ref 0.1–0.9)
MONO%: 1 % (ref 0.0–14.0)
NEUT#: 3.7 10*3/uL (ref 1.5–6.5)
NEUT%: 89.9 % — AB (ref 38.4–76.8)
PLATELETS: 212 10*3/uL (ref 145–400)
RBC: 3.89 10*6/uL (ref 3.70–5.45)
RDW: 14.3 % (ref 11.2–14.5)
WBC: 4.1 10*3/uL (ref 3.9–10.3)

## 2017-02-20 LAB — COMPREHENSIVE METABOLIC PANEL
ALT: 12 U/L (ref 0–55)
ANION GAP: 11 meq/L (ref 3–11)
AST: 18 U/L (ref 5–34)
Albumin: 3.8 g/dL (ref 3.5–5.0)
Alkaline Phosphatase: 51 U/L (ref 40–150)
BUN: 11.6 mg/dL (ref 7.0–26.0)
CALCIUM: 8.8 mg/dL (ref 8.4–10.4)
CHLORIDE: 108 meq/L (ref 98–109)
CO2: 22 meq/L (ref 22–29)
CREATININE: 0.7 mg/dL (ref 0.6–1.1)
Glucose: 129 mg/dl (ref 70–140)
POTASSIUM: 4.1 meq/L (ref 3.5–5.1)
Sodium: 141 mEq/L (ref 136–145)
Total Bilirubin: 0.47 mg/dL (ref 0.20–1.20)
Total Protein: 6 g/dL — ABNORMAL LOW (ref 6.4–8.3)

## 2017-02-20 MED ORDER — SODIUM CHLORIDE 0.9% FLUSH
10.0000 mL | INTRAVENOUS | Status: DC | PRN
  Filled 2017-02-20: qty 10

## 2017-02-20 MED ORDER — ACETAMINOPHEN 325 MG PO TABS
650.0000 mg | ORAL_TABLET | Freq: Once | ORAL | Status: AC
  Administered 2017-02-20: 650 mg via ORAL

## 2017-02-20 MED ORDER — SODIUM CHLORIDE 0.9 % IV SOLN
900.0000 mg | Freq: Once | INTRAVENOUS | Status: AC
  Administered 2017-02-20: 900 mg via INTRAVENOUS
  Filled 2017-02-20: qty 40

## 2017-02-20 MED ORDER — DIPHENHYDRAMINE HCL 25 MG PO CAPS
ORAL_CAPSULE | ORAL | Status: AC
  Filled 2017-02-20: qty 2

## 2017-02-20 MED ORDER — HEPARIN SOD (PORK) LOCK FLUSH 100 UNIT/ML IV SOLN
500.0000 [IU] | Freq: Once | INTRAVENOUS | Status: DC | PRN
  Filled 2017-02-20: qty 5

## 2017-02-20 MED ORDER — DEXAMETHASONE SODIUM PHOSPHATE 100 MG/10ML IJ SOLN
20.0000 mg | Freq: Once | INTRAMUSCULAR | Status: AC
  Administered 2017-02-20: 20 mg via INTRAVENOUS
  Filled 2017-02-20: qty 2

## 2017-02-20 MED ORDER — DEXAMETHASONE SODIUM PHOSPHATE 10 MG/ML IJ SOLN
INTRAMUSCULAR | Status: AC
  Filled 2017-02-20: qty 1

## 2017-02-20 MED ORDER — DIPHENHYDRAMINE HCL 25 MG PO CAPS
50.0000 mg | ORAL_CAPSULE | Freq: Once | ORAL | Status: AC
  Administered 2017-02-20: 50 mg via ORAL

## 2017-02-20 MED ORDER — ACETAMINOPHEN 325 MG PO TABS
ORAL_TABLET | ORAL | Status: AC
  Filled 2017-02-20: qty 2

## 2017-02-20 MED ORDER — PROCHLORPERAZINE MALEATE 10 MG PO TABS
ORAL_TABLET | ORAL | Status: AC
  Filled 2017-02-20: qty 1

## 2017-02-20 MED ORDER — PROCHLORPERAZINE MALEATE 10 MG PO TABS
10.0000 mg | ORAL_TABLET | Freq: Once | ORAL | Status: AC
  Administered 2017-02-20: 10 mg via ORAL

## 2017-02-20 MED ORDER — SODIUM CHLORIDE 0.9 % IV SOLN
Freq: Once | INTRAVENOUS | Status: AC
  Administered 2017-02-20: 10:00:00 via INTRAVENOUS

## 2017-02-20 NOTE — Patient Instructions (Signed)
[facility] Discharge Instructions for Patients Receiving Chemotherapy  Today you received the following chemotherapy agents: Darzalex  To help prevent nausea and vomiting after your treatment, we encourage you to take your nausea medication as directed.    If you develop nausea and vomiting that is not controlled by your nausea medication, call the clinic.   BELOW ARE SYMPTOMS THAT SHOULD BE REPORTED IMMEDIATELY:  *FEVER GREATER THAN 100.5 F  *CHILLS WITH OR WITHOUT FEVER  NAUSEA AND VOMITING THAT IS NOT CONTROLLED WITH YOUR NAUSEA MEDICATION  *UNUSUAL SHORTNESS OF BREATH  *UNUSUAL BRUISING OR BLEEDING  TENDERNESS IN MOUTH AND THROAT WITH OR WITHOUT PRESENCE OF ULCERS  *URINARY PROBLEMS  *BOWEL PROBLEMS  UNUSUAL RASH Items with * indicate a potential emergency and should be followed up as soon as possible.  Feel free to call the clinic should you have any questions or concerns. The clinic phone number is [phone].  Please show the CHEMO ALERT CARD at check-in to the Emergency Department and triage nurse.   

## 2017-02-23 ENCOUNTER — Telehealth: Payer: Self-pay

## 2017-02-23 ENCOUNTER — Ambulatory Visit: Payer: 59

## 2017-02-23 NOTE — Telephone Encounter (Signed)
Per 12/17 inbasket please move patient to 12/18 2:30 and per message pt is aware  Jessica Cochran

## 2017-02-24 ENCOUNTER — Ambulatory Visit (HOSPITAL_BASED_OUTPATIENT_CLINIC_OR_DEPARTMENT_OTHER): Payer: 59

## 2017-02-24 DIAGNOSIS — C9002 Multiple myeloma in relapse: Secondary | ICD-10-CM

## 2017-02-24 DIAGNOSIS — D801 Nonfamilial hypogammaglobulinemia: Secondary | ICD-10-CM

## 2017-02-25 ENCOUNTER — Telehealth: Payer: Self-pay

## 2017-02-25 LAB — KAPPA/LAMBDA LIGHT CHAINS
Ig Kappa Free Light Chain: 39.3 mg/L — ABNORMAL HIGH (ref 3.3–19.4)
Ig Lambda Free Light Chain: 5.3 mg/L — ABNORMAL LOW (ref 5.7–26.3)
KAPPA/LAMBDA FLC RATIO: 7.42 — AB (ref 0.26–1.65)

## 2017-02-25 NOTE — Telephone Encounter (Signed)
Called with below message. 

## 2017-02-25 NOTE — Telephone Encounter (Signed)
-----   Message from [name], MD sent at 02/25/2017 12:57 PM EST ----- Regarding: good results Pls call Diane/carl that Light chain levels are good Continue same treatment

## 2017-03-02 LAB — MULTIPLE MYELOMA PANEL, SERUM
ALBUMIN SERPL ELPH-MCNC: 3.5 g/dL (ref 2.9–4.4)
ALPHA2 GLOB SERPL ELPH-MCNC: 0.6 g/dL (ref 0.4–1.0)
Albumin/Glob SerPl: 1.9 — ABNORMAL HIGH (ref 0.7–1.7)
Alpha 1: 0.2 g/dL (ref 0.0–0.4)
B-GLOBULIN SERPL ELPH-MCNC: 0.7 g/dL (ref 0.7–1.3)
GAMMA GLOB SERPL ELPH-MCNC: 0.3 g/dL — AB (ref 0.4–1.8)
GLOBULIN, TOTAL: 1.9 g/dL — AB (ref 2.2–3.9)
IgA, Qn, Serum: 23 mg/dL — ABNORMAL LOW (ref 87–352)
IgG, Qn, Serum: 348 mg/dL — ABNORMAL LOW (ref 700–1600)
IgM, Qn, Serum: 15 mg/dL — ABNORMAL LOW (ref 26–217)
M PROTEIN SERPL ELPH-MCNC: 0.1 g/dL — AB
Total Protein: 5.4 g/dL — ABNORMAL LOW (ref 6.0–8.5)

## 2017-03-06 ENCOUNTER — Ambulatory Visit (HOSPITAL_BASED_OUTPATIENT_CLINIC_OR_DEPARTMENT_OTHER): Payer: 59

## 2017-03-06 ENCOUNTER — Other Ambulatory Visit (HOSPITAL_BASED_OUTPATIENT_CLINIC_OR_DEPARTMENT_OTHER): Payer: 59

## 2017-03-06 ENCOUNTER — Telehealth: Payer: Self-pay

## 2017-03-06 VITALS — BP 107/76 | HR 80 | Temp 98.2°F | Resp 17

## 2017-03-06 DIAGNOSIS — C9002 Multiple myeloma in relapse: Secondary | ICD-10-CM

## 2017-03-06 DIAGNOSIS — Z5112 Encounter for antineoplastic immunotherapy: Secondary | ICD-10-CM

## 2017-03-06 LAB — COMPREHENSIVE METABOLIC PANEL
ALT: 12 U/L (ref 0–55)
AST: 16 U/L (ref 5–34)
Albumin: 3.8 g/dL (ref 3.5–5.0)
Alkaline Phosphatase: 39 U/L — ABNORMAL LOW (ref 40–150)
Anion Gap: 7 mEq/L (ref 3–11)
BUN: 14.5 mg/dL (ref 7.0–26.0)
CHLORIDE: 107 meq/L (ref 98–109)
CO2: 26 meq/L (ref 22–29)
CREATININE: 0.8 mg/dL (ref 0.6–1.1)
Calcium: 8.5 mg/dL (ref 8.4–10.4)
EGFR: >60 mL/min/{1.73_m2} (ref >60)
GLUCOSE: 83 mg/dL (ref 70–140)
POTASSIUM: 3.6 meq/L (ref 3.5–5.1)
SODIUM: 140 meq/L (ref 136–145)
Total Bilirubin: 0.47 mg/dL (ref 0.20–1.20)
Total Protein: 5.7 g/dL — ABNORMAL LOW (ref 6.4–8.3)

## 2017-03-06 LAB — CBC WITH DIFFERENTIAL/PLATELET
BASO%: 2.4 % — AB (ref 0.0–2.0)
BASOS ABS: 0.1 10*3/uL (ref 0.0–0.1)
EOS%: 5.3 % (ref 0.0–7.0)
Eosinophils Absolute: 0.2 10*3/uL (ref 0.0–0.5)
HEMATOCRIT: 35.3 % (ref 34.8–46.6)
HGB: 11.5 g/dL — ABNORMAL LOW (ref 11.6–15.9)
LYMPH#: 1.6 10*3/uL (ref 0.9–3.3)
LYMPH%: 43.9 % (ref 14.0–49.7)
MCH: 33 pg (ref 25.1–34.0)
MCHC: 32.6 g/dL (ref 31.5–36.0)
MCV: 101.1 fL — ABNORMAL HIGH (ref 79.5–101.0)
MONO#: 0.8 10*3/uL (ref 0.1–0.9)
MONO%: 20.6 % — AB (ref 0.0–14.0)
NEUT#: 1 10*3/uL — ABNORMAL LOW (ref 1.5–6.5)
NEUT%: 27.8 % — AB (ref 38.4–76.8)
Platelets: 121 10*3/uL — ABNORMAL LOW (ref 145–400)
RBC: 3.49 10*6/uL — AB (ref 3.70–5.45)
RDW: 15 % — ABNORMAL HIGH (ref 11.2–14.5)
WBC: 3.7 10*3/uL — ABNORMAL LOW (ref 3.9–10.3)

## 2017-03-06 MED ORDER — SODIUM CHLORIDE 0.9 % IV SOLN
Freq: Once | INTRAVENOUS | Status: AC
  Administered 2017-03-06: 11:00:00 via INTRAVENOUS

## 2017-03-06 MED ORDER — ACETAMINOPHEN 325 MG PO TABS
650.0000 mg | ORAL_TABLET | Freq: Once | ORAL | Status: AC
  Administered 2017-03-06: 650 mg via ORAL

## 2017-03-06 MED ORDER — SODIUM CHLORIDE 0.9 % IV SOLN
15.2000 mg/kg | Freq: Once | INTRAVENOUS | Status: AC
  Administered 2017-03-06: 900 mg via INTRAVENOUS
  Filled 2017-03-06: qty 40

## 2017-03-06 MED ORDER — SODIUM CHLORIDE 0.9% FLUSH
10.0000 mL | INTRAVENOUS | Status: DC | PRN
  Administered 2017-03-06: 10 mL
  Filled 2017-03-06: qty 10

## 2017-03-06 MED ORDER — DIPHENHYDRAMINE HCL 25 MG PO CAPS
50.0000 mg | ORAL_CAPSULE | Freq: Once | ORAL | Status: AC
  Administered 2017-03-06: 50 mg via ORAL

## 2017-03-06 MED ORDER — PROCHLORPERAZINE MALEATE 10 MG PO TABS
10.0000 mg | ORAL_TABLET | Freq: Once | ORAL | Status: AC
  Administered 2017-03-06: 10 mg via ORAL

## 2017-03-06 MED ORDER — DIPHENHYDRAMINE HCL 25 MG PO CAPS
ORAL_CAPSULE | ORAL | Status: AC
  Filled 2017-03-06: qty 2

## 2017-03-06 MED ORDER — HEPARIN SOD (PORK) LOCK FLUSH 100 UNIT/ML IV SOLN
500.0000 [IU] | Freq: Once | INTRAVENOUS | Status: AC | PRN
  Administered 2017-03-06: 500 [IU]
  Filled 2017-03-06: qty 5

## 2017-03-06 MED ORDER — PROCHLORPERAZINE MALEATE 10 MG PO TABS
ORAL_TABLET | ORAL | Status: AC
  Filled 2017-03-06: qty 1

## 2017-03-06 MED ORDER — ACETAMINOPHEN 325 MG PO TABS
ORAL_TABLET | ORAL | Status: AC
  Filled 2017-03-06: qty 2

## 2017-03-06 MED ORDER — SODIUM CHLORIDE 0.9 % IV SOLN
20.0000 mg | Freq: Once | INTRAVENOUS | Status: AC
  Administered 2017-03-06: 20 mg via INTRAVENOUS
  Filled 2017-03-06: qty 2

## 2017-03-06 NOTE — Progress Notes (Signed)
Per Dr. Alen Blew ok to treat with ANC of 1.0

## 2017-03-06 NOTE — Patient Instructions (Signed)
[facility] Discharge Instructions for Patients Receiving Chemotherapy  Today you received the following chemotherapy agents: Darzalex  To help prevent nausea and vomiting after your treatment, we encourage you to take your nausea medication as directed.    If you develop nausea and vomiting that is not controlled by your nausea medication, call the clinic.   BELOW ARE SYMPTOMS THAT SHOULD BE REPORTED IMMEDIATELY:  *FEVER GREATER THAN 100.5 F  *CHILLS WITH OR WITHOUT FEVER  NAUSEA AND VOMITING THAT IS NOT CONTROLLED WITH YOUR NAUSEA MEDICATION  *UNUSUAL SHORTNESS OF BREATH  *UNUSUAL BRUISING OR BLEEDING  TENDERNESS IN MOUTH AND THROAT WITH OR WITHOUT PRESENCE OF ULCERS  *URINARY PROBLEMS  *BOWEL PROBLEMS  UNUSUAL RASH Items with * indicate a potential emergency and should be followed up as soon as possible.  Feel free to call the clinic should you have any questions or concerns. The clinic phone number is [phone].  Please show the CHEMO ALERT CARD at check-in to the Emergency Department and triage nurse.   

## 2017-03-06 NOTE — Telephone Encounter (Signed)
Ok to treat today with ANC 1.0 per Dr. Lindi Adie.  Cyndia Bent RN

## 2017-03-06 NOTE — Progress Notes (Signed)
Talked with patient in the infusion room. Given copy of labs today. Per Dr. Alen Blew, she is to hold Pomalyst until next week on 1/2 when Dr. Alvy Bimler returns to office.  Instructed to call office on 1/2. Verbalized understanding.

## 2017-03-11 ENCOUNTER — Ambulatory Visit (HOSPITAL_COMMUNITY)
Admission: RE | Admit: 2017-03-11 | Discharge: 2017-03-11 | Disposition: A | Discharge status: Home / Self Care | Payer: 59 | Source: Ambulatory Visit | Attending: Hematology and Oncology | Admitting: Hematology and Oncology

## 2017-03-11 ENCOUNTER — Other Ambulatory Visit (HOSPITAL_BASED_OUTPATIENT_CLINIC_OR_DEPARTMENT_OTHER): Payer: 59

## 2017-03-11 ENCOUNTER — Other Ambulatory Visit: Payer: Self-pay | Admitting: Hematology and Oncology

## 2017-03-11 ENCOUNTER — Telehealth: Payer: Self-pay | Admitting: Hematology and Oncology

## 2017-03-11 ENCOUNTER — Ambulatory Visit: Payer: 59 | Admitting: Hematology and Oncology

## 2017-03-11 ENCOUNTER — Telehealth: Payer: Self-pay | Admitting: *Deleted

## 2017-03-11 VITALS — BP 130/85 | HR 88 | Temp 98.3°F | Resp 18 | Ht 59.0 in | Wt 131.4 lb

## 2017-03-11 DIAGNOSIS — C9002 Multiple myeloma in relapse: Secondary | ICD-10-CM | POA: Insufficient documentation

## 2017-03-11 DIAGNOSIS — R042 Hemoptysis: Secondary | ICD-10-CM

## 2017-03-11 DIAGNOSIS — R05 Cough: Secondary | ICD-10-CM | POA: Diagnosis present

## 2017-03-11 DIAGNOSIS — D709 Neutropenia, unspecified: Secondary | ICD-10-CM | POA: Diagnosis not present

## 2017-03-11 DIAGNOSIS — J984 Other disorders of lung: Secondary | ICD-10-CM | POA: Diagnosis not present

## 2017-03-11 DIAGNOSIS — D702 Other drug-induced agranulocytosis: Secondary | ICD-10-CM

## 2017-03-11 LAB — COMPREHENSIVE METABOLIC PANEL
ALT: 14 U/L (ref 0–55)
ANION GAP: 5 meq/L (ref 3–11)
AST: 18 U/L (ref 5–34)
Albumin: 3.8 g/dL (ref 3.5–5.0)
Alkaline Phosphatase: 45 U/L (ref 40–150)
BUN: 12.5 mg/dL (ref 7.0–26.0)
CALCIUM: 8.7 mg/dL (ref 8.4–10.4)
CHLORIDE: 105 meq/L (ref 98–109)
CO2: 29 meq/L (ref 22–29)
Creatinine: 0.8 mg/dL (ref 0.6–1.1)
EGFR: >60 mL/min/{1.73_m2} (ref >60)
Glucose: 93 mg/dl (ref 70–140)
POTASSIUM: 3.9 meq/L (ref 3.5–5.1)
Sodium: 140 mEq/L (ref 136–145)
Total Bilirubin: 0.37 mg/dL (ref 0.20–1.20)
Total Protein: 6 g/dL — ABNORMAL LOW (ref 6.4–8.3)

## 2017-03-11 LAB — CBC WITH DIFFERENTIAL/PLATELET
BASO%: 4.3 % — AB (ref 0.0–2.0)
BASOS ABS: 0.2 10*3/uL — AB (ref 0.0–0.1)
EOS%: 9.5 % — AB (ref 0.0–7.0)
Eosinophils Absolute: 0.3 10*3/uL (ref 0.0–0.5)
HEMATOCRIT: 38.3 % (ref 34.8–46.6)
HGB: 12.2 g/dL (ref 11.6–15.9)
LYMPH#: 1.4 10*3/uL (ref 0.9–3.3)
LYMPH%: 41.4 % (ref 14.0–49.7)
MCH: 32.7 pg (ref 25.1–34.0)
MCHC: 31.9 g/dL (ref 31.5–36.0)
MCV: 102.7 fL — ABNORMAL HIGH (ref 79.5–101.0)
MONO#: 0.5 10*3/uL (ref 0.1–0.9)
MONO%: 15.2 % — ABNORMAL HIGH (ref 0.0–14.0)
NEUT#: 1 10*3/uL — ABNORMAL LOW (ref 1.5–6.5)
NEUT%: 29.6 % — AB (ref 38.4–76.8)
Platelets: 194 10*3/uL (ref 145–400)
RBC: 3.73 10*6/uL (ref 3.70–5.45)
RDW: 15.2 % — ABNORMAL HIGH (ref 11.2–14.5)
WBC: 3.5 10*3/uL — ABNORMAL LOW (ref 3.9–10.3)

## 2017-03-11 NOTE — Telephone Encounter (Signed)
-----   Message from [name], MD sent at 03/11/2017  7:08 AM EST ----- Regarding: Plan She does not have any more appointments. Her next infusion would be due on 1/25. I am off that day. Can she consider switching infusion to 1/24 or would she want to see me next week and keep her infusion as is? Let me know and I will put scheduling msg

## 2017-03-11 NOTE — Telephone Encounter (Signed)
Pt will come in for CXR, labs and see Dr Alvy Bimler today. Message to scheduler

## 2017-03-11 NOTE — Telephone Encounter (Signed)
Jessica Cochran would like to keep treatment on 1/25 as scheduled and see Dr Alvy Bimler next week.   Starting Pomalyst today per "On call MD" advice from Friday 1/28 due to low counts. Took Dara that day as schedule. She wants to know if she needs repeat labs prior to restarting Pomalyst today.  States she felt a congestion in her chest Monday night. Coughed up dark blood X 1. Has not had any coughed any since. Wants to if she needs to have a repeat CXR.

## 2017-03-11 NOTE — Telephone Encounter (Signed)
Gave patient AVS and calendar of upcoming January through march appointments.

## 2017-03-11 NOTE — Telephone Encounter (Signed)
Why don;t we get her to get CXR first (no appt needed, walk-in) and then swing by for labs and see me at 12 pm? If [facility] with her, send urgent scheduling msg pls?

## 2017-03-13 ENCOUNTER — Encounter: Payer: Self-pay | Admitting: Hematology and Oncology

## 2017-03-13 NOTE — Assessment & Plan Note (Signed)
I have reviewed recommendation from [facility] and [facility] The patient is doing well and remain asymptomatic Her recent myeloma panel looks a little worse Ultimately, she is interested to increase the dose of Pomalyst to 3 mg, to be taken daily for 21 days with 7 days off  Since then, her recent myeloma panel in December 2018 showed positive response to treatment I will continue monitoring of myeloma panel carefully on a monthly basis If her repeat myeloma panel is worse, we can consider switching her over to Kyprolis, Cytoxan and dexamethasone Expected side effects from the regimen is briefly discussed We will continue Daratumumab monthly as scheduled along with weekly dexamethasone She will continue calcium with vitamin D and Zometa every 6 months She will continue prophylactic treatment with acyclovir and aspirin

## 2017-03-13 NOTE — Assessment & Plan Note (Signed)
This is likely due to recent treatment. TShe is asymptomatic from the leukopenia. I will observe for now.  I will continue the chemotherapy at current dose without dosage adjustment.  If the leukopenia gets progressive worse in the future, I might have to delay her treatment or adjust the chemotherapy dose. We discussed neutropenic precautions

## 2017-03-13 NOTE — Progress Notes (Signed)
[place] OFFICE PROGRESS NOTE  Patient Care Team: [name], MD as PCP - General [name], MD as Consulting Physician (Oncology) [name], MD as Consulting Physician (Neurosurgery) [name], MD as Consulting Physician (Hematology and Oncology) [name], [name], MD as Referring Physician (Hematology and Oncology)  SUMMARY OF ONCOLOGIC HISTORY:   Multiple myeloma in relapse [facility])   07/21/2007 Bone Marrow Biopsy    Case #: [id number] Bone marrow showed myeloma      07/21/2007 Miscellaneous    She was diagnosed in May 2009. Had several cycles of RVD      10/14/2007 Bone Marrow Biopsy    Case #: [id number] repeat bone marrow biopsy showed good response to Rx      12/31/2007 Bone Marrow Transplant    She had autologous BMT at [facility]      07/19/2009 Bone Marrow Biopsy    Case #: [id number] Bone marrow biopsy showed only 1% plasma cells      10/10/2010 Bone Marrow Biopsy    [id number] Bone marrow biopsy showed 2% plasma cells      09/25/2011 Bone Marrow Biopsy    Accession #: [id number] Bone marrow only showed 4% plasma cells with ligh chain excess      10/14/2011 - 06/13/2013 Chemotherapy    She received Revlimid only, discontinued due to pancytopenia      06/18/2015 - 09/13/2015 Chemotherapy    She resumed taking Revlimid and dexamethasone. Treatment is stopped due to minimum response and pancytopenia      10/19/2015 -  Chemotherapy    The patient had Velcade for chemotherapy treatment along with weekly Daratumumab. Last dose of Velcade on 02/29/16, stopped due to progression. Pomalidomide added on 03/14/16      04/11/2016 Adverse Reaction    Delay resumption of Pomalyst cycle 2 until 04/16/16 due to neutropenia      09/16/2016 PET scan    1. No findings of active bony lymphoma. Prior activity in the sternum and thoracic spine has resolved. 2. Focal hypermetabolic activity along the sigmoid colon with some local inflammatory stranding  in the adjacent mesentery, maximum SUV 11.0. This is probably from mild acute diverticulitis. There is no hypermetabolic activity in this vicinity 4 months ago to suggest that this is from colon cancer. Correlate with any symptoms. 3. Acute right maxillary sinusitis. 4. Thoracolumbar compression fractures, vertebral augmentation at the L1 level.      09/17/2016 Procedure    CT guided bone marrow biopsy of right posterior iliac bone with both aspirate and core samples obtained.      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 - TRILINEAGE HEMATOPOIESIS. - SEE COMMENT. PERIPHERAL BLOOD: - LYMPHOPENIA. - THROMBOCYTOPENIA. Diagnosis Note The bone marrow is slightly hypocellular for age with trilineage hematopoiesis and non-specific myeloid changes likely related to previous therapy. Plasma cells are increased in number representing 8% of all cells in the aspirate, but with lack of large aggregates or sheets in the clot and biopsy sections. Immunohistochemical stains highlight the slightly increased plasma cell component in the marrow which shows kappa light chain restriction consistent with residual/recurrent plasma cell neoplasm. Correlation with cytogenetic and FISH studies recommended      09/17/2016 Pathology Results    Normal bone marrow cytogenetics and FISH       INTERVAL HISTORY: Please see below for problem oriented charting. She returns for further  follow-up She is being evaluated due to recent neutropenia and cough Around March 09, 2017, she had one episode of scant hemoptysis with associated mucus. She denies chest pain. No other forms of bleeding. Denies new back pain.  REVIEW OF SYSTEMS:   Constitutional: Denies fevers, chills or abnormal weight loss Eyes: Denies blurriness of vision Ears, nose, mouth, throat, and face: Denies mucositis or sore  throat Cardiovascular: Denies palpitation, chest discomfort or lower extremity swelling Gastrointestinal:  Denies nausea, heartburn or change in bowel habits Skin: Denies abnormal skin rashes Lymphatics: Denies new lymphadenopathy or easy bruising Neurological:Denies numbness, tingling or new weaknesses Behavioral/Psych: Mood is stable, no new changes  All other systems were reviewed with the patient and are negative.  I have reviewed the past medical history, past surgical history, social history and family history with the patient and they are unchanged from previous note.  ALLERGIES:  is allergic to hydromorphone.  MEDICATIONS:  Current Outpatient Medications  Medication Sig Dispense Refill  . acyclovir (ZOVIRAX) 400 MG tablet Take 1 tablet (400 mg total) by mouth 2 (two) times daily. 60 tablet 11  . aspirin 81 MG tablet Take 81 mg by mouth.    . cholecalciferol (VITAMIN D) 1000 UNITS tablet Take 1,000 Units by mouth daily.    Marland Kitchen dexamethasone (DECADRON) 4 MG tablet Take 5 tabs once a week with breakfast every Tuesdays 90 tablet 1  . Multiple Vitamins-Minerals (ONE-A-DAY 50 PLUS PO) Take by mouth daily.    Marland Kitchen oxycodone (OXY-IR) 5 MG capsule Take 5 mg by mouth every 4 (four) hours as needed. pain    . Oxycodone HCl (OXYCONTIN) 60 MG TB12 Take 1 tablet by mouth 2 (two) times daily.    . pantoprazole (PROTONIX) 40 MG tablet Take 1 tablet (40 mg total) by mouth daily. 30 tablet 9  . POMALYST 3 MG capsule TAKE 1 CAPSULE BY MOUTH ONCE DAILY FOR 21 DAYS ON AND 7 DAYS OFF 21 capsule 11   No current facility-administered medications for this visit.     PHYSICAL EXAMINATION: ECOG PERFORMANCE STATUS: 1 - Symptomatic but completely ambulatory  Vitals:   03/11/17 1209  BP: 130/85  Pulse: 88  Resp: 18  Temp: 98.3 F (36.8 C)  SpO2: 100%   Filed Weights   03/11/17 1209  Weight: 131 lb 6.4 oz (59.6 kg)    GENERAL:alert, no distress and comfortable SKIN: skin color, texture, turgor are  normal, no rashes or significant lesions EYES: normal, Conjunctiva are pink and non-injected, sclera clear OROPHARYNX:no exudate, no erythema and lips, buccal mucosa, and tongue normal  NECK: supple, thyroid normal size, non-tender, without nodularity LYMPH:  no palpable lymphadenopathy in the cervical, axillary or inguinal LUNGS: clear to auscultation and percussion with normal breathing effort HEART: regular rate & rhythm and no murmurs and no lower extremity edema ABDOMEN:abdomen soft, non-tender and normal bowel sounds Musculoskeletal:no cyanosis of digits and no clubbing  NEURO: alert & oriented x 3 with fluent speech, no focal motor/sensory deficits  LABORATORY DATA:  I have reviewed the data as listed    Component Value Date/Time   NA 140 03/11/2017 1150   K 3.9 03/11/2017 1150   CL 107 08/30/2012 1055   CO2 29 03/11/2017 1150   GLUCOSE 93 03/11/2017 1150   GLUCOSE 102 (H) 08/30/2012 1055   BUN 12.5 03/11/2017 1150   CREATININE 0.8 03/11/2017 1150   CALCIUM 8.7 03/11/2017 1150   PROT 6.0 (L) 03/11/2017 1150   ALBUMIN 3.8 03/11/2017 1150  AST 18 03/11/2017 1150   ALT 14 03/11/2017 1150   ALKPHOS 45 03/11/2017 1150   BILITOT 0.37 03/11/2017 1150    No results found for: SPEP, UPEP  Lab Results  Component Value Date   WBC 3.5 (L) 03/11/2017   NEUTROABS 1.0 (L) 03/11/2017   HGB 12.2 03/11/2017   HCT 38.3 03/11/2017   MCV 102.7 (H) 03/11/2017   PLT 194 03/11/2017      Chemistry      Component Value Date/Time   NA 140 03/11/2017 1150   K 3.9 03/11/2017 1150   CL 107 08/30/2012 1055   CO2 29 03/11/2017 1150   BUN 12.5 03/11/2017 1150   CREATININE 0.8 03/11/2017 1150      Component Value Date/Time   CALCIUM 8.7 03/11/2017 1150   ALKPHOS 45 03/11/2017 1150   AST 18 03/11/2017 1150   ALT 14 03/11/2017 1150   BILITOT 0.37 03/11/2017 1150       RADIOGRAPHIC STUDIES: I have personally reviewed the radiological images as listed and agreed with the findings  in the report. Dg Chest 2 View  Result Date: 03/11/2017 CLINICAL DATA:  Cough.  Neutropenia.  Multiple myeloma. EXAM: CHEST  2 VIEW COMPARISON:  None. FINDINGS: The heart size and mediastinal contours are within normal limits. Right-sided Port-A-Cath in appropriate position. Linear opacity in both lung bases, greatest in the lingula, consistent with scarring. No evidence of pulmonary airspace disease or edema. No evidence of pleural effusion. Multiple old fracture deformities are seen involving the sternum and lower thoracic and upper lumbar vertebral bodies. Prior vertebroplasty noted. IMPRESSION: New bibasilar scarring.  No active lung disease. Electronically Signed   By: Earle Gell M.D.   On: 03/11/2017 11:20    ASSESSMENT & PLAN:  Multiple myeloma in relapse [facility]) I have reviewed recommendation from [facility] and [facility] The patient is doing well and remain asymptomatic Her recent myeloma panel looks a little worse Ultimately, she is interested to increase the dose of Pomalyst to 3 mg, to be taken daily for 21 days with 7 days off  Since then, her recent myeloma panel in December 2018 showed positive response to treatment I will continue monitoring of myeloma panel carefully on a monthly basis If her repeat myeloma panel is worse, we can consider switching her over to Kyprolis, Cytoxan and dexamethasone Expected side effects from the regimen is briefly discussed We will continue Daratumumab monthly as scheduled along with weekly dexamethasone She will continue calcium with vitamin D and Zometa every 6 months She will continue prophylactic treatment with acyclovir and aspirin  Drug-induced neutropenia ([place]) This is likely due to recent treatment. TShe is asymptomatic from the leukopenia. I will observe for now.  I will continue the chemotherapy at current dose without dosage adjustment.  If the leukopenia gets progressive worse in the future, I might have to  delay her treatment or adjust the chemotherapy dose. We discussed neutropenic precautions    Hemoptysis She had recent history of hemoptysis Chest x-ray showed no evidence of lesions or pulmonary infiltrate It was a one-time episode which I suspect is due to mucosal tearing I recommend close observation only   Orders Placed This Encounter  Procedures  . Kappa/lambda light chains    Standing Status:   Standing    Number of Occurrences:   9    Standing Expiration Date:   03/11/2018  . Multiple Myeloma Panel (SPEP&IFE w/QIG)    Standing Status:   Standing  Number of Occurrences:   9    Standing Expiration Date:   03/11/2018   All questions were answered. The patient knows to call the clinic with any problems, questions or concerns. No barriers to learning was detected. I spent 15 minutes counseling the patient face to face. The total time spent in the appointment was 20 minutes and more than 50% was on counseling and review of test results     [name], MD 03/13/2017 10:09 AM

## 2017-03-13 NOTE — Assessment & Plan Note (Signed)
She had recent history of hemoptysis Chest x-ray showed no evidence of lesions or pulmonary infiltrate It was a one-time episode which I suspect is due to mucosal tearing I recommend close observation only

## 2017-03-18 ENCOUNTER — Telehealth: Payer: Self-pay | Admitting: *Deleted

## 2017-03-18 ENCOUNTER — Inpatient Hospital Stay: Payer: 59 | Attending: Hematology and Oncology

## 2017-03-18 DIAGNOSIS — Z5112 Encounter for antineoplastic immunotherapy: Secondary | ICD-10-CM | POA: Insufficient documentation

## 2017-03-18 DIAGNOSIS — Z9221 Personal history of antineoplastic chemotherapy: Secondary | ICD-10-CM | POA: Diagnosis not present

## 2017-03-18 DIAGNOSIS — C9002 Multiple myeloma in relapse: Secondary | ICD-10-CM | POA: Insufficient documentation

## 2017-03-18 DIAGNOSIS — Z79899 Other long term (current) drug therapy: Secondary | ICD-10-CM | POA: Diagnosis not present

## 2017-03-18 LAB — CBC WITH DIFFERENTIAL/PLATELET
ABS GRANULOCYTE: 4.2 10*3/uL (ref 1.5–6.5)
BASOS ABS: 0.1 10*3/uL (ref 0.0–0.1)
BASOS PCT: 2 %
Eosinophils Absolute: 0.4 10*3/uL (ref 0.0–0.5)
Eosinophils Relative: 6 %
HEMATOCRIT: 40.7 % (ref 34.8–46.6)
Hemoglobin: 13 g/dL (ref 11.6–15.9)
Lymphocytes Relative: 18 %
Lymphs Abs: 1.1 10*3/uL (ref 0.9–3.3)
MCH: 33 pg (ref 25.1–34.0)
MCHC: 31.9 g/dL (ref 31.5–36.0)
MCV: 103.3 fL — ABNORMAL HIGH (ref 79.5–101.0)
MONO ABS: 0.3 10*3/uL (ref 0.1–0.9)
Monocytes Relative: 5 %
NEUTROS PCT: 69 %
Neutro Abs: 4.2 10*3/uL (ref 1.5–6.5)
PLATELETS: 179 10*3/uL (ref 145–400)
RBC: 3.94 MIL/uL (ref 3.70–5.45)
RDW: 15.1 % (ref 11.2–16.1)
WBC: 6.2 10*3/uL (ref 3.9–10.3)

## 2017-03-18 LAB — COMPREHENSIVE METABOLIC PANEL
ALT: 17 U/L (ref 0–55)
ANION GAP: 8 (ref 3–11)
AST: 21 U/L (ref 5–34)
Albumin: 3.9 g/dL (ref 3.5–5.0)
Alkaline Phosphatase: 61 U/L (ref 40–150)
BILIRUBIN TOTAL: 0.5 mg/dL (ref 0.2–1.2)
BUN: 11 mg/dL (ref 7–26)
CHLORIDE: 105 mmol/L (ref 98–109)
CO2: 26 mmol/L (ref 22–29)
Calcium: 8.8 mg/dL (ref 8.4–10.4)
Creatinine, Ser: 0.83 mg/dL (ref 0.60–1.10)
Glucose, Bld: 88 mg/dL (ref 70–140)
POTASSIUM: 3.8 mmol/L (ref 3.3–4.7)
Sodium: 139 mmol/L (ref 136–145)
Total Protein: 6.1 g/dL — ABNORMAL LOW (ref 6.4–8.3)

## 2017-03-18 NOTE — Telephone Encounter (Signed)
-----   Message from [name], MD sent at 03/18/2017  3:54 PM EST ----- Regarding: labs ok pls let her know ----- Message ----- From: Interface, Lab In [place] Sent: 03/18/2017  11:03 AM To: [name], MD

## 2017-03-18 NOTE — Telephone Encounter (Signed)
LM with note below 

## 2017-03-19 LAB — KAPPA/LAMBDA LIGHT CHAINS
KAPPA, LAMDA LIGHT CHAIN RATIO: 25.78 — AB (ref 0.26–1.65)
Kappa free light chain: 82.5 mg/L — ABNORMAL HIGH (ref 3.3–19.4)
LAMDA FREE LIGHT CHAINS: 3.2 mg/L — AB (ref 5.7–26.3)

## 2017-03-20 ENCOUNTER — Telehealth: Payer: Self-pay | Admitting: *Deleted

## 2017-03-20 ENCOUNTER — Other Ambulatory Visit: Payer: Self-pay | Admitting: Hematology and Oncology

## 2017-03-20 NOTE — Telephone Encounter (Signed)
LM to call Dr Gorsuch' nurse 

## 2017-03-20 NOTE — Telephone Encounter (Signed)
HOLD renewal until I talk to her

## 2017-03-20 NOTE — Telephone Encounter (Signed)
Notified of message below.  Will come on 03/25/17 @ 0930  Msg to scheduler

## 2017-03-20 NOTE — Telephone Encounter (Signed)
-----   Message from [name], MD sent at 03/20/2017  8:02 AM EST ----- Regarding: myeloma panel Her recent myeloma panel looks worse than Dec but still better than November. I think it could be due to   1) Lab agency change 2) Dec number was a fluke.  I can see her next week to discuss. Holding off Pomalyst refill for now because I may plan to increase her dose further.  I have available appt on Wed 1/16 at 930 am. If it works for them, please send scheduling msg

## 2017-03-23 ENCOUNTER — Telehealth: Payer: Self-pay

## 2017-03-23 LAB — MULTIPLE MYELOMA PANEL, SERUM
ALBUMIN/GLOB SERPL: 2 — AB (ref 0.7–1.7)
Albumin SerPl Elph-Mcnc: 3.9 g/dL (ref 2.9–4.4)
Alpha 1: 0.2 g/dL (ref 0.0–0.4)
Alpha2 Glob SerPl Elph-Mcnc: 0.6 g/dL (ref 0.4–1.0)
B-GLOBULIN SERPL ELPH-MCNC: 0.8 g/dL (ref 0.7–1.3)
Gamma Glob SerPl Elph-Mcnc: 0.3 g/dL — ABNORMAL LOW (ref 0.4–1.8)
Globulin, Total: 2 g/dL — ABNORMAL LOW (ref 2.2–3.9)
IGG (IMMUNOGLOBIN G), SERUM: 346 mg/dL — AB (ref 700–1600)
IGM (IMMUNOGLOBULIN M), SRM: 11 mg/dL — AB (ref 26–217)
IgA: 19 mg/dL — ABNORMAL LOW (ref 87–352)
M PROTEIN SERPL ELPH-MCNC: 0.1 g/dL — AB
Total Protein ELP: 5.9 g/dL — ABNORMAL LOW (ref 6.0–8.5)

## 2017-03-23 NOTE — Telephone Encounter (Signed)
Called regarding after hours call. Question regarding taking Pomalyst, instructed to take as she normally does until seen by Dr. Alvy Bimler. Verbalized understanding.  Another scheduling message sent for appt 1/16 at 0930.

## 2017-03-25 ENCOUNTER — Inpatient Hospital Stay: Payer: 59 | Admitting: Hematology and Oncology

## 2017-03-25 ENCOUNTER — Telehealth: Payer: Self-pay | Admitting: Hematology and Oncology

## 2017-03-25 VITALS — BP 133/75 | HR 81 | Temp 98.4°F | Resp 18 | Ht 59.0 in | Wt 132.9 lb

## 2017-03-25 DIAGNOSIS — C9002 Multiple myeloma in relapse: Secondary | ICD-10-CM | POA: Diagnosis not present

## 2017-03-25 DIAGNOSIS — Z9221 Personal history of antineoplastic chemotherapy: Secondary | ICD-10-CM

## 2017-03-25 DIAGNOSIS — Z79899 Other long term (current) drug therapy: Secondary | ICD-10-CM | POA: Diagnosis not present

## 2017-03-25 DIAGNOSIS — C9000 Multiple myeloma not having achieved remission: Secondary | ICD-10-CM

## 2017-03-25 NOTE — Telephone Encounter (Signed)
No  New orders or visit per 1/16 los. °

## 2017-03-26 ENCOUNTER — Encounter: Payer: Self-pay | Admitting: Hematology and Oncology

## 2017-03-26 NOTE — Assessment & Plan Note (Signed)
I spent some time reviewing her myeloma panel, taking into account differences in laboratory measurement Overall, her serum light chain is improved with increased dose of Pomalyst but she has not achieved VG PR We discussed treatment options We discussed clinical trials and referral to tertiary centers at [facility] or [facility] At present time, the patient is not symptomatic Ultimately, after extensive discussion, she is in agreement to proceed with current dose of Pomalyst and chemotherapy as scheduled next week I will call the patient with test results If her serum light chain is not much improved, we will proceed to refer her back to [facility] for further discussion.  She is in agreement with the plan

## 2017-03-26 NOTE — Progress Notes (Signed)
[place] OFFICE PROGRESS NOTE  Patient Care Team: [name], MD as PCP - General [name], MD as Consulting Physician (Oncology) [name], MD as Consulting Physician (Neurosurgery) [name], MD as Consulting Physician (Hematology and Oncology) [name], [name], MD as Referring Physician (Hematology and Oncology)  SUMMARY OF ONCOLOGIC HISTORY:   Multiple myeloma in relapse [facility])   07/21/2007 Bone Marrow Biopsy    Case #: [id number] Bone marrow showed myeloma      07/21/2007 Miscellaneous    She was diagnosed in May 2009. Had several cycles of RVD      10/14/2007 Bone Marrow Biopsy    Case #: [id number] repeat bone marrow biopsy showed good response to Rx      12/31/2007 Bone Marrow Transplant    She had autologous BMT at [facility]      07/19/2009 Bone Marrow Biopsy    Case #: [id number] Bone marrow biopsy showed only 1% plasma cells      10/10/2010 Bone Marrow Biopsy    [id number] Bone marrow biopsy showed 2% plasma cells      09/25/2011 Bone Marrow Biopsy    Accession #: [id number] Bone marrow only showed 4% plasma cells with ligh chain excess      10/14/2011 - 06/13/2013 Chemotherapy    She received Revlimid only, discontinued due to pancytopenia      06/18/2015 - 09/13/2015 Chemotherapy    She resumed taking Revlimid and dexamethasone. Treatment is stopped due to minimum response and pancytopenia      10/19/2015 -  Chemotherapy    The patient had Velcade for chemotherapy treatment along with weekly Daratumumab. Last dose of Velcade on 02/29/16, stopped due to progression. Pomalidomide added on 03/14/16      04/11/2016 Adverse Reaction    Delay resumption of Pomalyst cycle 2 until 04/16/16 due to neutropenia      09/16/2016 PET scan    1. No findings of active bony lymphoma. Prior activity in the sternum and thoracic spine has resolved. 2. Focal hypermetabolic activity along the sigmoid colon with some local inflammatory stranding  in the adjacent mesentery, maximum SUV 11.0. This is probably from mild acute diverticulitis. There is no hypermetabolic activity in this vicinity 4 months ago to suggest that this is from colon cancer. Correlate with any symptoms. 3. Acute right maxillary sinusitis. 4. Thoracolumbar compression fractures, vertebral augmentation at the L1 level.      09/17/2016 Procedure    CT guided bone marrow biopsy of right posterior iliac bone with both aspirate and core samples obtained.      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 - TRILINEAGE HEMATOPOIESIS. - SEE COMMENT. PERIPHERAL BLOOD: - LYMPHOPENIA. - THROMBOCYTOPENIA. Diagnosis Note The bone marrow is slightly hypocellular for age with trilineage hematopoiesis and non-specific myeloid changes likely related to previous therapy. Plasma cells are increased in number representing 8% of all cells in the aspirate, but with lack of large aggregates or sheets in the clot and biopsy sections. Immunohistochemical stains highlight the slightly increased plasma cell component in the marrow which shows kappa light chain restriction consistent with residual/recurrent plasma cell neoplasm. Correlation with cytogenetic and FISH studies recommended      09/17/2016 Pathology Results    Normal bone marrow cytogenetics and FISH       INTERVAL HISTORY: Please see below for problem oriented charting. She returns with her  husband today specifically to address abnormal blood test results drawn last week In the meantime, she is not symptomatic No recent fever or chills No further coughing or bleeding.  Denies worsening bone pain.  REVIEW OF SYSTEMS:   Constitutional: Denies fevers, chills or abnormal weight loss Eyes: Denies blurriness of vision Ears, nose, mouth, throat, and face: Denies mucositis or sore throat Respiratory: Denies cough,  dyspnea or wheezes Cardiovascular: Denies palpitation, chest discomfort or lower extremity swelling Gastrointestinal:  Denies nausea, heartburn or change in bowel habits Skin: Denies abnormal skin rashes Lymphatics: Denies new lymphadenopathy or easy bruising Neurological:Denies numbness, tingling or new weaknesses Behavioral/Psych: Mood is stable, no new changes  All other systems were reviewed with the patient and are negative.  I have reviewed the past medical history, past surgical history, social history and family history with the patient and they are unchanged from previous note.  ALLERGIES:  is allergic to hydromorphone.  MEDICATIONS:  Current Outpatient Medications  Medication Sig Dispense Refill  . acyclovir (ZOVIRAX) 400 MG tablet Take 1 tablet (400 mg total) by mouth 2 (two) times daily. 60 tablet 11  . aspirin 81 MG tablet Take 81 mg by mouth.    . cholecalciferol (VITAMIN D) 1000 UNITS tablet Take 1,000 Units by mouth daily.    Marland Kitchen dexamethasone (DECADRON) 4 MG tablet Take 5 tabs once a week with breakfast every Tuesdays 90 tablet 1  . Multiple Vitamins-Minerals (ONE-A-DAY 50 PLUS PO) Take by mouth daily.    Marland Kitchen oxycodone (OXY-IR) 5 MG capsule Take 5 mg by mouth every 4 (four) hours as needed. pain    . Oxycodone HCl (OXYCONTIN) 60 MG TB12 Take 1 tablet by mouth 2 (two) times daily.    . pantoprazole (PROTONIX) 40 MG tablet Take 1 tablet (40 mg total) by mouth daily. 30 tablet 9  . POMALYST 3 MG capsule TAKE 1 CAPSULE BY MOUTH ONCE DAILY FOR 21 DAYS ON AND 7 DAYS OFF 21 capsule 11   No current facility-administered medications for this visit.     PHYSICAL EXAMINATION: ECOG PERFORMANCE STATUS: 1 - Symptomatic but completely ambulatory  Vitals:   03/25/17 0930  BP: 133/75  Pulse: 81  Resp: 18  Temp: 98.4 F (36.9 C)  SpO2: 97%   Filed Weights   03/25/17 0930  Weight: 132 lb 14.4 oz (60.3 kg)    GENERAL:alert, no distress and comfortable SKIN: skin color,  texture, turgor are normal, no rashes or significant lesions Musculoskeletal:no cyanosis of digits and no clubbing  NEURO: alert & oriented x 3 with fluent speech, no focal motor/sensory deficits  LABORATORY DATA:  I have reviewed the data as listed    Component Value Date/Time   NA 139 03/18/2017 1037   NA 140 03/11/2017 1150   K 3.8 03/18/2017 1037   K 3.9 03/11/2017 1150   CL 105 03/18/2017 1037   CL 107 08/30/2012 1055   CO2 26 03/18/2017 1037   CO2 29 03/11/2017 1150   GLUCOSE 88 03/18/2017 1037   GLUCOSE 93 03/11/2017 1150   GLUCOSE 102 (H) 08/30/2012 1055   BUN 11 03/18/2017 1037   BUN 12.5 03/11/2017 1150   CREATININE 0.83 03/18/2017 1037   CREATININE 0.8 03/11/2017 1150   CALCIUM 8.8 03/18/2017 1037   CALCIUM 8.7 03/11/2017 1150   PROT 6.1 (L) 03/18/2017 1037   PROT 6.0 (L) 03/11/2017 1150   ALBUMIN 3.9 03/18/2017 1037   ALBUMIN 3.8 03/11/2017 1150   AST 21 03/18/2017 1037   AST  18 03/11/2017 1150   ALT 17 03/18/2017 1037   ALT 14 03/11/2017 1150   ALKPHOS 61 03/18/2017 1037   ALKPHOS 45 03/11/2017 1150   BILITOT 0.5 03/18/2017 1037   BILITOT 0.37 03/11/2017 1150   GFRNONAA >60 03/18/2017 1037   GFRAA >60 03/18/2017 1037    No results found for: SPEP, UPEP  Lab Results  Component Value Date   WBC 6.2 03/18/2017   NEUTROABS 4.2 03/18/2017   HGB 13.0 03/18/2017   HCT 40.7 03/18/2017   MCV 103.3 (H) 03/18/2017   PLT 179 03/18/2017      Chemistry      Component Value Date/Time   NA 139 03/18/2017 1037   NA 140 03/11/2017 1150   K 3.8 03/18/2017 1037   K 3.9 03/11/2017 1150   CL 105 03/18/2017 1037   CL 107 08/30/2012 1055   CO2 26 03/18/2017 1037   CO2 29 03/11/2017 1150   BUN 11 03/18/2017 1037   BUN 12.5 03/11/2017 1150   CREATININE 0.83 03/18/2017 1037   CREATININE 0.8 03/11/2017 1150      Component Value Date/Time   CALCIUM 8.8 03/18/2017 1037   CALCIUM 8.7 03/11/2017 1150   ALKPHOS 61 03/18/2017 1037   ALKPHOS 45 03/11/2017 1150    AST 21 03/18/2017 1037   AST 18 03/11/2017 1150   ALT 17 03/18/2017 1037   ALT 14 03/11/2017 1150   BILITOT 0.5 03/18/2017 1037   BILITOT 0.37 03/11/2017 1150       RADIOGRAPHIC STUDIES: I have personally reviewed the radiological images as listed and agreed with the findings in the report. Dg Chest 2 View  Result Date: 03/11/2017 CLINICAL DATA:  Cough.  Neutropenia.  Multiple myeloma. EXAM: CHEST  2 VIEW COMPARISON:  None. FINDINGS: The heart size and mediastinal contours are within normal limits. Right-sided Port-A-Cath in appropriate position. Linear opacity in both lung bases, greatest in the lingula, consistent with scarring. No evidence of pulmonary airspace disease or edema. No evidence of pleural effusion. Multiple old fracture deformities are seen involving the sternum and lower thoracic and upper lumbar vertebral bodies. Prior vertebroplasty noted. IMPRESSION: New bibasilar scarring.  No active lung disease. Electronically Signed   By: Earle Gell M.D.   On: 03/11/2017 11:20    ASSESSMENT & PLAN:  Multiple myeloma in relapse ([place]) I spent some time reviewing her myeloma panel, taking into account differences in laboratory measurement Overall, her serum light chain is improved with increased dose of Pomalyst but she has not achieved VG PR We discussed treatment options We discussed clinical trials and referral to tertiary centers at [facility] or [facility] At present time, the patient is not symptomatic Ultimately, after extensive discussion, she is in agreement to proceed with current dose of Pomalyst and chemotherapy as scheduled next week I will call the patient with test results If her serum light chain is not much improved, we will proceed to refer her back to [facility] for further discussion.  She is in agreement with the plan   Orders Placed This Encounter  Procedures  . Kappa/lambda light chains    Standing Status:   Future    Standing Expiration Date:    04/30/2018  . Multiple Myeloma Panel (SPEP&IFE w/QIG)    Standing Status:   Future    Standing Expiration Date:   04/30/2018   All questions were answered. The patient knows to call the clinic with any problems, questions or concerns. No barriers to learning was  detected. I spent 15 minutes counseling the patient face to face. The total time spent in the appointment was 20 minutes and more than 50% was on counseling and review of test results     [name], MD 03/26/2017 4:05 PM

## 2017-04-03 ENCOUNTER — Inpatient Hospital Stay: Payer: 59

## 2017-04-03 VITALS — BP 126/92 | HR 77 | Temp 97.7°F | Resp 16

## 2017-04-03 DIAGNOSIS — C9002 Multiple myeloma in relapse: Secondary | ICD-10-CM

## 2017-04-03 DIAGNOSIS — C9000 Multiple myeloma not having achieved remission: Secondary | ICD-10-CM

## 2017-04-03 LAB — COMPREHENSIVE METABOLIC PANEL
ALK PHOS: 49 U/L (ref 40–150)
ALT: 38 U/L (ref 0–55)
AST: 22 U/L (ref 5–34)
Albumin: 4 g/dL (ref 3.5–5.0)
Anion gap: 9 (ref 3–11)
BILIRUBIN TOTAL: 0.5 mg/dL (ref 0.2–1.2)
BUN: 15 mg/dL (ref 7–26)
CALCIUM: 9.4 mg/dL (ref 8.4–10.4)
CO2: 24 mmol/L (ref 22–29)
CREATININE: 0.79 mg/dL (ref 0.60–1.10)
Chloride: 107 mmol/L (ref 98–109)
GFR calc Af Amer: >60 mL/min (ref >60)
Glucose, Bld: 119 mg/dL (ref 70–140)
Potassium: 4.4 mmol/L (ref 3.3–4.7)
Sodium: 140 mmol/L (ref 136–145)
Total Protein: 6.1 g/dL — ABNORMAL LOW (ref 6.4–8.3)

## 2017-04-03 LAB — CBC WITH DIFFERENTIAL/PLATELET
BASOS PCT: 1 %
Basophils Absolute: 0 10*3/uL (ref 0.0–0.1)
EOS ABS: 0 10*3/uL (ref 0.0–0.5)
EOS PCT: 1 %
HCT: 38.5 % (ref 34.8–46.6)
Hemoglobin: 12.6 g/dL (ref 11.6–15.9)
LYMPHS ABS: 0.2 10*3/uL — AB (ref 0.9–3.3)
Lymphocytes Relative: 23 %
MCH: 33.3 pg (ref 25.1–34.0)
MCHC: 32.7 g/dL (ref 31.5–36.0)
MCV: 101.9 fL — ABNORMAL HIGH (ref 79.5–101.0)
Monocytes Absolute: 0 10*3/uL — ABNORMAL LOW (ref 0.1–0.9)
Monocytes Relative: 3 %
Neutro Abs: 0.7 10*3/uL — ABNORMAL LOW (ref 1.5–6.5)
Neutrophils Relative %: 72 %
PLATELETS: 102 10*3/uL — AB (ref 145–400)
RBC: 3.78 MIL/uL (ref 3.70–5.45)
RDW: 15 % (ref 11.2–16.1)
WBC: 0.9 10*3/uL — AB (ref 3.9–10.3)

## 2017-04-03 MED ORDER — PROCHLORPERAZINE MALEATE 10 MG PO TABS
ORAL_TABLET | ORAL | Status: AC
  Filled 2017-04-03: qty 1

## 2017-04-03 MED ORDER — PROCHLORPERAZINE MALEATE 10 MG PO TABS
10.0000 mg | ORAL_TABLET | Freq: Once | ORAL | Status: AC
  Administered 2017-04-03: 10 mg via ORAL

## 2017-04-03 MED ORDER — ACETAMINOPHEN 325 MG PO TABS
ORAL_TABLET | ORAL | Status: AC
  Filled 2017-04-03: qty 2

## 2017-04-03 MED ORDER — SODIUM CHLORIDE 0.9 % IV SOLN
20.0000 mg | Freq: Once | INTRAVENOUS | Status: AC
  Administered 2017-04-03: 20 mg via INTRAVENOUS
  Filled 2017-04-03: qty 2

## 2017-04-03 MED ORDER — HEPARIN SOD (PORK) LOCK FLUSH 100 UNIT/ML IV SOLN
500.0000 [IU] | Freq: Once | INTRAVENOUS | Status: AC | PRN
  Administered 2017-04-03: 500 [IU]
  Filled 2017-04-03: qty 5

## 2017-04-03 MED ORDER — ACETAMINOPHEN 325 MG PO TABS
650.0000 mg | ORAL_TABLET | Freq: Once | ORAL | Status: AC
  Administered 2017-04-03: 650 mg via ORAL

## 2017-04-03 MED ORDER — DIPHENHYDRAMINE HCL 25 MG PO CAPS
50.0000 mg | ORAL_CAPSULE | Freq: Once | ORAL | Status: AC
  Administered 2017-04-03: 50 mg via ORAL

## 2017-04-03 MED ORDER — SODIUM CHLORIDE 0.9 % IV SOLN
Freq: Once | INTRAVENOUS | Status: AC
  Administered 2017-04-03: 10:00:00 via INTRAVENOUS

## 2017-04-03 MED ORDER — SODIUM CHLORIDE 0.9% FLUSH
10.0000 mL | INTRAVENOUS | Status: DC | PRN
  Administered 2017-04-03: 10 mL
  Filled 2017-04-03: qty 10

## 2017-04-03 MED ORDER — DARATUMUMAB CHEMO INJECTION 400 MG/20ML
15.2000 mg/kg | Freq: Once | INTRAVENOUS | Status: AC
  Administered 2017-04-03: 900 mg via INTRAVENOUS
  Filled 2017-04-03: qty 5

## 2017-04-03 MED ORDER — DIPHENHYDRAMINE HCL 25 MG PO CAPS
ORAL_CAPSULE | ORAL | Status: AC
  Filled 2017-04-03: qty 2

## 2017-04-03 NOTE — Progress Notes (Signed)
Dr. Alvy Bimler okay to tx with ANC of 0.7. Pt aware and in agreement to proceed. Pt reminded to wear mask, wash hands and food thoroughly, measure temperature in morning and evening, and call if temperature of 100.64F or higher - go to the ED.

## 2017-04-03 NOTE — Patient Instructions (Signed)
[facility] Discharge Instructions for Patients Receiving Chemotherapy  Today you received the following chemotherapy agents: Darzalex  To help prevent nausea and vomiting after your treatment, we encourage you to take your nausea medication as directed.    If you develop nausea and vomiting that is not controlled by your nausea medication, call the clinic.   BELOW ARE SYMPTOMS THAT SHOULD BE REPORTED IMMEDIATELY:  *FEVER GREATER THAN 100.5 F  *CHILLS WITH OR WITHOUT FEVER  NAUSEA AND VOMITING THAT IS NOT CONTROLLED WITH YOUR NAUSEA MEDICATION  *UNUSUAL SHORTNESS OF BREATH  *UNUSUAL BRUISING OR BLEEDING  TENDERNESS IN MOUTH AND THROAT WITH OR WITHOUT PRESENCE OF ULCERS  *URINARY PROBLEMS  *BOWEL PROBLEMS  UNUSUAL RASH Items with * indicate a potential emergency and should be followed up as soon as possible.  Feel free to call the clinic should you have any questions or concerns. The clinic phone number is [phone].  Please show the CHEMO ALERT CARD at check-in to the Emergency Department and triage nurse.   

## 2017-04-06 ENCOUNTER — Telehealth: Payer: Self-pay | Admitting: *Deleted

## 2017-04-06 ENCOUNTER — Telehealth: Payer: Self-pay | Admitting: Hematology and Oncology

## 2017-04-06 LAB — KAPPA/LAMBDA LIGHT CHAINS
KAPPA, LAMDA LIGHT CHAIN RATIO: 13.14 — AB (ref 0.26–1.65)
Kappa free light chain: 38.1 mg/L — ABNORMAL HIGH (ref 3.3–19.4)
LAMDA FREE LIGHT CHAINS: 2.9 mg/L — AB (ref 5.7–26.3)

## 2017-04-06 NOTE — Telephone Encounter (Signed)
Left message for patient regarding upcoming January appointments per 1/28 sch message.

## 2017-04-06 NOTE — Telephone Encounter (Signed)
Left message that labs are good- light chains are better. Pomalyst refill was sent to pharmacy.  Come in for labs only on Friday. Do not restart pomalyst until [place] is greater than 1.  Please call for any questions.  Message to scheduler

## 2017-04-07 LAB — MULTIPLE MYELOMA PANEL, SERUM
ALPHA2 GLOB SERPL ELPH-MCNC: 0.7 g/dL (ref 0.4–1.0)
Albumin SerPl Elph-Mcnc: 3.7 g/dL (ref 2.9–4.4)
Albumin/Glob SerPl: 1.8 — ABNORMAL HIGH (ref 0.7–1.7)
Alpha 1: 0.3 g/dL (ref 0.0–0.4)
B-Globulin SerPl Elph-Mcnc: 0.8 g/dL (ref 0.7–1.3)
Gamma Glob SerPl Elph-Mcnc: 0.3 g/dL — ABNORMAL LOW (ref 0.4–1.8)
Globulin, Total: 2.1 g/dL — ABNORMAL LOW (ref 2.2–3.9)
IGA: 17 mg/dL — AB (ref 87–352)
IGG (IMMUNOGLOBIN G), SERUM: 304 mg/dL — AB (ref 700–1600)
IGM (IMMUNOGLOBULIN M), SRM: 11 mg/dL — AB (ref 26–217)
M Protein SerPl Elph-Mcnc: 0.1 g/dL — ABNORMAL HIGH
TOTAL PROTEIN ELP: 5.8 g/dL — AB (ref 6.0–8.5)

## 2017-04-09 ENCOUNTER — Other Ambulatory Visit: Payer: 59

## 2017-04-09 ENCOUNTER — Telehealth: Payer: Self-pay

## 2017-04-09 ENCOUNTER — Inpatient Hospital Stay: Payer: 59

## 2017-04-09 DIAGNOSIS — C9002 Multiple myeloma in relapse: Secondary | ICD-10-CM | POA: Diagnosis not present

## 2017-04-09 LAB — COMPREHENSIVE METABOLIC PANEL
ALBUMIN: 3.7 g/dL (ref 3.5–5.0)
ALK PHOS: 56 U/L (ref 40–150)
ALT: 16 U/L (ref 0–55)
ANION GAP: 6 (ref 3–11)
AST: 19 U/L (ref 5–34)
BUN: 13 mg/dL (ref 7–26)
CALCIUM: 8.5 mg/dL (ref 8.4–10.4)
CO2: 27 mmol/L (ref 22–29)
CREATININE: 0.78 mg/dL (ref 0.60–1.10)
Chloride: 107 mmol/L (ref 98–109)
GFR calc Af Amer: >60 mL/min (ref >60)
GFR calc non Af Amer: >60 mL/min (ref >60)
GLUCOSE: 85 mg/dL (ref 70–140)
Potassium: 4.1 mmol/L (ref 3.5–5.1)
SODIUM: 140 mmol/L (ref 136–145)
Total Bilirubin: 0.3 mg/dL (ref 0.2–1.2)
Total Protein: 5.9 g/dL — ABNORMAL LOW (ref 6.4–8.3)

## 2017-04-09 LAB — CBC WITH DIFFERENTIAL/PLATELET
BASOS PCT: 3 %
Basophils Absolute: 0.1 10*3/uL (ref 0.0–0.1)
Eosinophils Absolute: 0.3 10*3/uL (ref 0.0–0.5)
Eosinophils Relative: 7 %
HCT: 38.4 % (ref 34.8–46.6)
HEMOGLOBIN: 12.4 g/dL (ref 11.6–15.9)
Lymphocytes Relative: 33 %
Lymphs Abs: 1.2 10*3/uL (ref 0.9–3.3)
MCH: 33.3 pg (ref 25.1–34.0)
MCHC: 32.3 g/dL (ref 31.5–36.0)
MCV: 103.2 fL — ABNORMAL HIGH (ref 79.5–101.0)
Monocytes Absolute: 0.6 10*3/uL (ref 0.1–0.9)
Monocytes Relative: 17 %
NEUTROS PCT: 40 %
Neutro Abs: 1.4 10*3/uL — ABNORMAL LOW (ref 1.5–6.5)
Platelets: 189 10*3/uL (ref 145–400)
RBC: 3.72 MIL/uL (ref 3.70–5.45)
RDW: 15.5 % — ABNORMAL HIGH (ref 11.2–14.5)
WBC: 3.7 10*3/uL — AB (ref 3.9–10.3)

## 2017-04-09 NOTE — Telephone Encounter (Signed)
Called and left below message. Instructed to call nurse for questions.

## 2017-04-09 NOTE — Telephone Encounter (Signed)
-----   Message from [name], MD sent at 04/09/2017 12:29 PM EST ----- Regarding: labs OK CBC has recovered. SHe can resume Pomalyst ----- Message ----- From: Buel Ream, Lab In [place] Sent: 04/09/2017  11:33 AM To: [name], MD

## 2017-04-22 ENCOUNTER — Other Ambulatory Visit: Payer: Self-pay | Admitting: Hematology and Oncology

## 2017-04-22 NOTE — Telephone Encounter (Signed)
-

## 2017-05-01 ENCOUNTER — Inpatient Hospital Stay: Payer: 59

## 2017-05-01 ENCOUNTER — Inpatient Hospital Stay: Payer: 59 | Attending: Hematology and Oncology

## 2017-05-01 VITALS — BP 130/79 | HR 63 | Temp 98.6°F | Resp 18

## 2017-05-01 DIAGNOSIS — C9002 Multiple myeloma in relapse: Secondary | ICD-10-CM

## 2017-05-01 DIAGNOSIS — Z5112 Encounter for antineoplastic immunotherapy: Secondary | ICD-10-CM | POA: Diagnosis not present

## 2017-05-01 LAB — CBC WITH DIFFERENTIAL/PLATELET
BASOS ABS: 0 10*3/uL (ref 0.0–0.1)
Basophils Relative: 1 %
Eosinophils Absolute: 0 10*3/uL (ref 0.0–0.5)
Eosinophils Relative: 0 %
HEMATOCRIT: 37.1 % (ref 34.8–46.6)
Hemoglobin: 12.2 g/dL (ref 11.6–15.9)
LYMPHS ABS: 0.5 10*3/uL — AB (ref 0.9–3.3)
LYMPHS PCT: 17 %
MCH: 33.3 pg (ref 25.1–34.0)
MCHC: 32.9 g/dL (ref 31.5–36.0)
MCV: 101.4 fL — AB (ref 79.5–101.0)
MONO ABS: 0.9 10*3/uL (ref 0.1–0.9)
Monocytes Relative: 28 %
NEUTROS ABS: 1.7 10*3/uL (ref 1.5–6.5)
Neutrophils Relative %: 54 %
Platelets: 103 10*3/uL — ABNORMAL LOW (ref 145–400)
RBC: 3.66 MIL/uL — ABNORMAL LOW (ref 3.70–5.45)
RDW: 15.7 % — AB (ref 11.2–14.5)
WBC: 3.1 10*3/uL — ABNORMAL LOW (ref 3.9–10.3)

## 2017-05-01 LAB — COMPREHENSIVE METABOLIC PANEL
ALT: 14 U/L (ref 0–55)
AST: 18 U/L (ref 5–34)
Albumin: 3.8 g/dL (ref 3.5–5.0)
Alkaline Phosphatase: 54 U/L (ref 40–150)
Anion gap: 10 (ref 3–11)
BUN: 13 mg/dL (ref 7–26)
CO2: 23 mmol/L (ref 22–29)
Calcium: 9 mg/dL (ref 8.4–10.4)
Chloride: 108 mmol/L (ref 98–109)
Creatinine, Ser: 0.72 mg/dL (ref 0.60–1.10)
Glucose, Bld: 94 mg/dL (ref 70–140)
POTASSIUM: 4.2 mmol/L (ref 3.5–5.1)
Sodium: 141 mmol/L (ref 136–145)
TOTAL PROTEIN: 5.8 g/dL — AB (ref 6.4–8.3)
Total Bilirubin: 0.5 mg/dL (ref 0.2–1.2)

## 2017-05-01 MED ORDER — ACETAMINOPHEN 325 MG PO TABS
650.0000 mg | ORAL_TABLET | Freq: Once | ORAL | Status: AC
  Administered 2017-05-01: 650 mg via ORAL

## 2017-05-01 MED ORDER — SODIUM CHLORIDE 0.9 % IV SOLN
Freq: Once | INTRAVENOUS | Status: AC
  Administered 2017-05-01: 09:00:00 via INTRAVENOUS

## 2017-05-01 MED ORDER — DIPHENHYDRAMINE HCL 25 MG PO CAPS
ORAL_CAPSULE | ORAL | Status: AC
  Filled 2017-05-01: qty 2

## 2017-05-01 MED ORDER — SODIUM CHLORIDE 0.9 % IV SOLN
15.2000 mg/kg | Freq: Once | INTRAVENOUS | Status: AC
  Administered 2017-05-01: 900 mg via INTRAVENOUS
  Filled 2017-05-01: qty 40

## 2017-05-01 MED ORDER — SODIUM CHLORIDE 0.9% FLUSH
10.0000 mL | INTRAVENOUS | Status: DC | PRN
  Administered 2017-05-01: 10 mL
  Filled 2017-05-01: qty 10

## 2017-05-01 MED ORDER — DEXAMETHASONE SODIUM PHOSPHATE 100 MG/10ML IJ SOLN
20.0000 mg | Freq: Once | INTRAMUSCULAR | Status: AC
  Administered 2017-05-01: 20 mg via INTRAVENOUS
  Filled 2017-05-01: qty 2

## 2017-05-01 MED ORDER — HEPARIN SOD (PORK) LOCK FLUSH 100 UNIT/ML IV SOLN
500.0000 [IU] | Freq: Once | INTRAVENOUS | Status: AC | PRN
  Administered 2017-05-01: 500 [IU]
  Filled 2017-05-01: qty 5

## 2017-05-01 MED ORDER — ACETAMINOPHEN 325 MG PO TABS
ORAL_TABLET | ORAL | Status: AC
  Filled 2017-05-01: qty 2

## 2017-05-01 MED ORDER — DIPHENHYDRAMINE HCL 25 MG PO CAPS
50.0000 mg | ORAL_CAPSULE | Freq: Once | ORAL | Status: AC
  Administered 2017-05-01: 50 mg via ORAL

## 2017-05-01 MED ORDER — PROCHLORPERAZINE MALEATE 10 MG PO TABS
10.0000 mg | ORAL_TABLET | Freq: Once | ORAL | Status: AC
  Administered 2017-05-01: 10 mg via ORAL

## 2017-05-01 MED ORDER — PROCHLORPERAZINE MALEATE 10 MG PO TABS
ORAL_TABLET | ORAL | Status: AC
  Filled 2017-05-01: qty 1

## 2017-05-01 NOTE — Patient Instructions (Signed)
[facility] Discharge Instructions for Patients Receiving Chemotherapy  Today you received the following chemotherapy agents: Darzalex  To help prevent nausea and vomiting after your treatment, we encourage you to take your nausea medication as directed.    If you develop nausea and vomiting that is not controlled by your nausea medication, call the clinic.   BELOW ARE SYMPTOMS THAT SHOULD BE REPORTED IMMEDIATELY:  *FEVER GREATER THAN 100.5 F  *CHILLS WITH OR WITHOUT FEVER  NAUSEA AND VOMITING THAT IS NOT CONTROLLED WITH YOUR NAUSEA MEDICATION  *UNUSUAL SHORTNESS OF BREATH  *UNUSUAL BRUISING OR BLEEDING  TENDERNESS IN MOUTH AND THROAT WITH OR WITHOUT PRESENCE OF ULCERS  *URINARY PROBLEMS  *BOWEL PROBLEMS  UNUSUAL RASH Items with * indicate a potential emergency and should be followed up as soon as possible.  Feel free to call the clinic should you have any questions or concerns. The clinic phone number is [phone].  Please show the CHEMO ALERT CARD at check-in to the Emergency Department and triage nurse.   

## 2017-05-21 ENCOUNTER — Other Ambulatory Visit: Payer: Self-pay

## 2017-05-21 ENCOUNTER — Other Ambulatory Visit: Payer: Self-pay | Admitting: Hematology and Oncology

## 2017-05-21 MED ORDER — POMALIDOMIDE 3 MG PO CAPS: 3.0000 mg | ORAL_CAPSULE | Freq: Every day | ORAL | 11 refills | Status: DC

## 2017-05-29 ENCOUNTER — Inpatient Hospital Stay: Payer: 59 | Attending: Hematology and Oncology

## 2017-05-29 ENCOUNTER — Telehealth: Payer: Self-pay

## 2017-05-29 ENCOUNTER — Inpatient Hospital Stay: Payer: 59

## 2017-05-29 VITALS — BP 113/75 | HR 80 | Temp 97.7°F | Resp 17

## 2017-05-29 DIAGNOSIS — Z5112 Encounter for antineoplastic immunotherapy: Secondary | ICD-10-CM | POA: Diagnosis present

## 2017-05-29 DIAGNOSIS — C9002 Multiple myeloma in relapse: Secondary | ICD-10-CM | POA: Diagnosis not present

## 2017-05-29 DIAGNOSIS — Z79899 Other long term (current) drug therapy: Secondary | ICD-10-CM | POA: Insufficient documentation

## 2017-05-29 LAB — CBC WITH DIFFERENTIAL/PLATELET
BASOS ABS: 0 10*3/uL (ref 0.0–0.1)
BASOS PCT: 1 %
EOS PCT: 1 %
Eosinophils Absolute: 0 10*3/uL (ref 0.0–0.5)
HCT: 36.5 % (ref 34.8–46.6)
Hemoglobin: 12.1 g/dL (ref 11.6–15.9)
Lymphocytes Relative: 16 %
Lymphs Abs: 0.5 10*3/uL — ABNORMAL LOW (ref 0.9–3.3)
MCH: 33.4 pg (ref 25.1–34.0)
MCHC: 33.1 g/dL (ref 31.5–36.0)
MCV: 101.2 fL — AB (ref 79.5–101.0)
MONO ABS: 1.3 10*3/uL — AB (ref 0.1–0.9)
Monocytes Relative: 39 %
Neutro Abs: 1.5 10*3/uL (ref 1.5–6.5)
Neutrophils Relative %: 43 %
PLATELETS: 95 10*3/uL — AB (ref 145–400)
RBC: 3.61 MIL/uL — ABNORMAL LOW (ref 3.70–5.45)
RDW: 15 % — AB (ref 11.2–14.5)
WBC: 3.4 10*3/uL — ABNORMAL LOW (ref 3.9–10.3)

## 2017-05-29 LAB — COMPREHENSIVE METABOLIC PANEL
ALK PHOS: 56 U/L (ref 40–150)
ALT: 12 U/L (ref 0–55)
AST: 14 U/L (ref 5–34)
Albumin: 3.7 g/dL (ref 3.5–5.0)
Anion gap: 9 (ref 3–11)
BILIRUBIN TOTAL: 0.5 mg/dL (ref 0.2–1.2)
BUN: 15 mg/dL (ref 7–26)
CALCIUM: 8.5 mg/dL (ref 8.4–10.4)
CO2: 23 mmol/L (ref 22–29)
Chloride: 109 mmol/L (ref 98–109)
Creatinine, Ser: 0.76 mg/dL (ref 0.60–1.10)
GFR calc Af Amer: >60 mL/min (ref >60)
GFR calc non Af Amer: >60 mL/min (ref >60)
Glucose, Bld: 102 mg/dL (ref 70–140)
Potassium: 3.8 mmol/L (ref 3.5–5.1)
Sodium: 141 mmol/L (ref 136–145)
TOTAL PROTEIN: 5.6 g/dL — AB (ref 6.4–8.3)

## 2017-05-29 MED ORDER — SODIUM CHLORIDE 0.9 % IV SOLN
15.2000 mg/kg | Freq: Once | INTRAVENOUS | Status: AC
  Administered 2017-05-29: 900 mg via INTRAVENOUS
  Filled 2017-05-29: qty 40

## 2017-05-29 MED ORDER — ACETAMINOPHEN 325 MG PO TABS
ORAL_TABLET | ORAL | Status: AC
  Filled 2017-05-29: qty 2

## 2017-05-29 MED ORDER — PROCHLORPERAZINE MALEATE 10 MG PO TABS
ORAL_TABLET | ORAL | Status: AC
  Filled 2017-05-29: qty 1

## 2017-05-29 MED ORDER — SODIUM CHLORIDE 0.9 % IV SOLN
Freq: Once | INTRAVENOUS | Status: AC
  Administered 2017-05-29: 09:00:00 via INTRAVENOUS

## 2017-05-29 MED ORDER — SODIUM CHLORIDE 0.9% FLUSH
10.0000 mL | INTRAVENOUS | Status: DC | PRN
  Administered 2017-05-29: 10 mL
  Filled 2017-05-29: qty 10

## 2017-05-29 MED ORDER — DIPHENHYDRAMINE HCL 25 MG PO CAPS
ORAL_CAPSULE | ORAL | Status: AC
Start: 2017-05-29 — End: 2017-05-29
  Filled 2017-05-29: qty 2

## 2017-05-29 MED ORDER — PROCHLORPERAZINE MALEATE 10 MG PO TABS
10.0000 mg | ORAL_TABLET | Freq: Once | ORAL | Status: AC
  Administered 2017-05-29: 10 mg via ORAL

## 2017-05-29 MED ORDER — SODIUM CHLORIDE 0.9 % IV SOLN
20.0000 mg | Freq: Once | INTRAVENOUS | Status: AC
  Administered 2017-05-29: 20 mg via INTRAVENOUS
  Filled 2017-05-29: qty 2

## 2017-05-29 MED ORDER — DIPHENHYDRAMINE HCL 25 MG PO CAPS
50.0000 mg | ORAL_CAPSULE | Freq: Once | ORAL | Status: AC
  Administered 2017-05-29: 50 mg via ORAL

## 2017-05-29 MED ORDER — HEPARIN SOD (PORK) LOCK FLUSH 100 UNIT/ML IV SOLN
500.0000 [IU] | Freq: Once | INTRAVENOUS | Status: AC | PRN
  Administered 2017-05-29: 500 [IU]
  Filled 2017-05-29: qty 5

## 2017-05-29 MED ORDER — ACETAMINOPHEN 325 MG PO TABS
650.0000 mg | ORAL_TABLET | Freq: Once | ORAL | Status: AC
  Administered 2017-05-29: 650 mg via ORAL

## 2017-05-29 NOTE — Progress Notes (Signed)
Per Dr Lindi Adie ok to tx today with ANC 1.5 and platelets of 95.

## 2017-05-29 NOTE — Patient Instructions (Signed)
[facility] Discharge Instructions for Patients Receiving Chemotherapy  Today you received the following chemotherapy agents: Darzalex  To help prevent nausea and vomiting after your treatment, we encourage you to take your nausea medication as directed.    If you develop nausea and vomiting that is not controlled by your nausea medication, call the clinic.   BELOW ARE SYMPTOMS THAT SHOULD BE REPORTED IMMEDIATELY:  *FEVER GREATER THAN 100.5 F  *CHILLS WITH OR WITHOUT FEVER  NAUSEA AND VOMITING THAT IS NOT CONTROLLED WITH YOUR NAUSEA MEDICATION  *UNUSUAL SHORTNESS OF BREATH  *UNUSUAL BRUISING OR BLEEDING  TENDERNESS IN MOUTH AND THROAT WITH OR WITHOUT PRESENCE OF ULCERS  *URINARY PROBLEMS  *BOWEL PROBLEMS  UNUSUAL RASH Items with * indicate a potential emergency and should be followed up as soon as possible.  Feel free to call the clinic should you have any questions or concerns. The clinic phone number is [phone].  Please show the CHEMO ALERT CARD at check-in to the Emergency Department and triage nurse.   

## 2017-05-29 NOTE — Telephone Encounter (Signed)
Ok to treat with todays labs per Dr. Lindi Adie. Infusion RN aware.  Cyndia Bent RN

## 2017-06-01 ENCOUNTER — Telehealth: Payer: Self-pay | Admitting: *Deleted

## 2017-06-01 ENCOUNTER — Other Ambulatory Visit: Payer: Self-pay | Admitting: Hematology and Oncology

## 2017-06-01 DIAGNOSIS — C9002 Multiple myeloma in relapse: Secondary | ICD-10-CM

## 2017-06-01 LAB — KAPPA/LAMBDA LIGHT CHAINS
KAPPA, LAMDA LIGHT CHAIN RATIO: 12.6 — AB (ref 0.26–1.65)
Kappa free light chain: 31.5 mg/L — ABNORMAL HIGH (ref 3.3–19.4)
LAMDA FREE LIGHT CHAINS: 2.5 mg/L — AB (ref 5.7–26.3)

## 2017-06-01 NOTE — Telephone Encounter (Signed)
-----   Message from [name], MD sent at 06/01/2017 12:45 PM EDT ----- Regarding: myeloma panel Tell her light chains are good I have placed more scheduling msg for chemo on 4/19 and 5/17. I will see her on 5/17. Myeloma panel is now ordered monthly Continue pomalyst as well ----- Message ----- From: Interface, Lab In [place] Sent: 06/01/2017  12:38 PM To: [name], MD

## 2017-06-01 NOTE — Telephone Encounter (Signed)
Notified of message below. Verbalized understanding 

## 2017-06-02 ENCOUNTER — Telehealth: Payer: Self-pay | Admitting: Hematology and Oncology

## 2017-06-02 NOTE — Telephone Encounter (Signed)
Spoke to patients husband regarding upcoming April and may appointments per 3/25 sch message.

## 2017-06-03 LAB — MULTIPLE MYELOMA PANEL, SERUM
ALBUMIN SERPL ELPH-MCNC: 3.4 g/dL (ref 2.9–4.4)
ALPHA 1: 0.2 g/dL (ref 0.0–0.4)
Albumin/Glob SerPl: 1.8 — ABNORMAL HIGH (ref 0.7–1.7)
Alpha2 Glob SerPl Elph-Mcnc: 0.6 g/dL (ref 0.4–1.0)
B-GLOBULIN SERPL ELPH-MCNC: 0.8 g/dL (ref 0.7–1.3)
GAMMA GLOB SERPL ELPH-MCNC: 0.3 g/dL — AB (ref 0.4–1.8)
GLOBULIN, TOTAL: 1.9 g/dL — AB (ref 2.2–3.9)
IGG (IMMUNOGLOBIN G), SERUM: 270 mg/dL — AB (ref 700–1600)
IgA: 11 mg/dL — ABNORMAL LOW (ref 87–352)
IgM (Immunoglobulin M), Srm: 10 mg/dL — ABNORMAL LOW (ref 26–217)
M PROTEIN SERPL ELPH-MCNC: 0.1 g/dL — AB
Total Protein ELP: 5.3 g/dL — ABNORMAL LOW (ref 6.0–8.5)

## 2017-06-22 ENCOUNTER — Other Ambulatory Visit: Payer: Self-pay | Admitting: Hematology and Oncology

## 2017-06-25 ENCOUNTER — Other Ambulatory Visit: Payer: Self-pay

## 2017-06-25 MED ORDER — POMALIDOMIDE 3 MG PO CAPS: 3.0000 mg | ORAL_CAPSULE | Freq: Every day | ORAL | 11 refills | Status: DC

## 2017-06-26 ENCOUNTER — Inpatient Hospital Stay: Payer: 59

## 2017-06-26 ENCOUNTER — Ambulatory Visit: Payer: 59

## 2017-06-26 ENCOUNTER — Inpatient Hospital Stay: Payer: 59 | Attending: Hematology and Oncology

## 2017-06-26 VITALS — BP 119/79 | HR 65 | Temp 97.9°F | Resp 16

## 2017-06-26 DIAGNOSIS — C9002 Multiple myeloma in relapse: Secondary | ICD-10-CM

## 2017-06-26 DIAGNOSIS — Z5112 Encounter for antineoplastic immunotherapy: Secondary | ICD-10-CM | POA: Insufficient documentation

## 2017-06-26 DIAGNOSIS — Z79899 Other long term (current) drug therapy: Secondary | ICD-10-CM | POA: Diagnosis not present

## 2017-06-26 DIAGNOSIS — Z95828 Presence of other vascular implants and grafts: Secondary | ICD-10-CM

## 2017-06-26 LAB — CBC WITH DIFFERENTIAL/PLATELET
BASOS PCT: 1 %
Basophils Absolute: 0 10*3/uL (ref 0.0–0.1)
EOS ABS: 0 10*3/uL (ref 0.0–0.5)
Eosinophils Relative: 1 %
HCT: 35.2 % (ref 34.8–46.6)
Hemoglobin: 11.6 g/dL (ref 11.6–15.9)
Lymphocytes Relative: 17 %
Lymphs Abs: 0.5 10*3/uL — ABNORMAL LOW (ref 0.9–3.3)
MCH: 33 pg (ref 25.1–34.0)
MCHC: 33 g/dL (ref 31.5–36.0)
MCV: 100.1 fL (ref 79.5–101.0)
MONO ABS: 0.9 10*3/uL (ref 0.1–0.9)
MONOS PCT: 29 %
NEUTROS PCT: 52 %
Neutro Abs: 1.6 10*3/uL (ref 1.5–6.5)
Platelets: 130 10*3/uL — ABNORMAL LOW (ref 145–400)
RBC: 3.52 MIL/uL — ABNORMAL LOW (ref 3.70–5.45)
RDW: 15.1 % — AB (ref 11.2–14.5)
WBC: 3 10*3/uL — ABNORMAL LOW (ref 3.9–10.3)

## 2017-06-26 LAB — COMPREHENSIVE METABOLIC PANEL
ALBUMIN: 3.8 g/dL (ref 3.5–5.0)
ALT: 17 U/L (ref 0–55)
ANION GAP: 9 (ref 3–11)
AST: 17 U/L (ref 5–34)
Alkaline Phosphatase: 58 U/L (ref 40–150)
BUN: 16 mg/dL (ref 7–26)
CO2: 27 mmol/L (ref 22–29)
Calcium: 8.8 mg/dL (ref 8.4–10.4)
Chloride: 105 mmol/L (ref 98–109)
Creatinine, Ser: 0.7 mg/dL (ref 0.60–1.10)
GFR calc Af Amer: >60 mL/min (ref >60)
GFR calc non Af Amer: >60 mL/min (ref >60)
GLUCOSE: 84 mg/dL (ref 70–140)
POTASSIUM: 3.7 mmol/L (ref 3.5–5.1)
SODIUM: 141 mmol/L (ref 136–145)
TOTAL PROTEIN: 5.9 g/dL — AB (ref 6.4–8.3)
Total Bilirubin: 0.6 mg/dL (ref 0.2–1.2)

## 2017-06-26 MED ORDER — DIPHENHYDRAMINE HCL 25 MG PO CAPS
50.0000 mg | ORAL_CAPSULE | Freq: Once | ORAL | Status: AC
  Administered 2017-06-26: 50 mg via ORAL

## 2017-06-26 MED ORDER — SODIUM CHLORIDE 0.9% FLUSH
10.0000 mL | INTRAVENOUS | Status: DC | PRN
  Administered 2017-06-26: 10 mL
  Filled 2017-06-26: qty 10

## 2017-06-26 MED ORDER — SODIUM CHLORIDE 0.9 % IV SOLN
20.0000 mg | Freq: Once | INTRAVENOUS | Status: AC
  Administered 2017-06-26: 20 mg via INTRAVENOUS
  Filled 2017-06-26: qty 2

## 2017-06-26 MED ORDER — HEPARIN SOD (PORK) LOCK FLUSH 100 UNIT/ML IV SOLN
500.0000 [IU] | Freq: Once | INTRAVENOUS | Status: AC | PRN
  Administered 2017-06-26: 500 [IU]
  Filled 2017-06-26: qty 5

## 2017-06-26 MED ORDER — ACETAMINOPHEN 325 MG PO TABS
ORAL_TABLET | ORAL | Status: AC
  Filled 2017-06-26: qty 2

## 2017-06-26 MED ORDER — PROCHLORPERAZINE MALEATE 10 MG PO TABS
10.0000 mg | ORAL_TABLET | Freq: Once | ORAL | Status: AC
  Administered 2017-06-26: 10 mg via ORAL

## 2017-06-26 MED ORDER — PROCHLORPERAZINE MALEATE 10 MG PO TABS
ORAL_TABLET | ORAL | Status: AC
  Filled 2017-06-26: qty 1

## 2017-06-26 MED ORDER — SODIUM CHLORIDE 0.9 % IV SOLN
15.2000 mg/kg | Freq: Once | INTRAVENOUS | Status: AC
  Administered 2017-06-26: 900 mg via INTRAVENOUS
  Filled 2017-06-26: qty 40

## 2017-06-26 MED ORDER — DIPHENHYDRAMINE HCL 25 MG PO CAPS
ORAL_CAPSULE | ORAL | Status: AC
  Filled 2017-06-26: qty 2

## 2017-06-26 MED ORDER — ACETAMINOPHEN 325 MG PO TABS
650.0000 mg | ORAL_TABLET | Freq: Once | ORAL | Status: AC
  Administered 2017-06-26: 650 mg via ORAL

## 2017-06-26 MED ORDER — SODIUM CHLORIDE 0.9 % IJ SOLN
10.0000 mL | INTRAMUSCULAR | Status: DC | PRN
  Administered 2017-06-26: 10 mL via INTRAVENOUS
  Filled 2017-06-26: qty 10

## 2017-06-26 MED ORDER — SODIUM CHLORIDE 0.9 % IJ SOLN
10.0000 mL | INTRAMUSCULAR | Status: DC | PRN
  Filled 2017-06-26: qty 10

## 2017-06-26 MED ORDER — SODIUM CHLORIDE 0.9 % IV SOLN
Freq: Once | INTRAVENOUS | Status: AC
  Administered 2017-06-26: 10:00:00 via INTRAVENOUS

## 2017-06-26 NOTE — Patient Instructions (Signed)

## 2017-06-26 NOTE — Patient Instructions (Signed)
[facility] Discharge Instructions for Patients Receiving Chemotherapy  Today you received the following chemotherapy agents:  Darzalex  To help prevent nausea and vomiting after your treatment, we encourage you to take your nausea medication as prescribed.   If you develop nausea and vomiting that is not controlled by your nausea medication, call the clinic.   BELOW ARE SYMPTOMS THAT SHOULD BE REPORTED IMMEDIATELY:  *FEVER GREATER THAN 100.5 F  *CHILLS WITH OR WITHOUT FEVER  NAUSEA AND VOMITING THAT IS NOT CONTROLLED WITH YOUR NAUSEA MEDICATION  *UNUSUAL SHORTNESS OF BREATH  *UNUSUAL BRUISING OR BLEEDING  TENDERNESS IN MOUTH AND THROAT WITH OR WITHOUT PRESENCE OF ULCERS  *URINARY PROBLEMS  *BOWEL PROBLEMS  UNUSUAL RASH Items with * indicate a potential emergency and should be followed up as soon as possible.  Feel free to call the clinic should you have any questions or concerns. The clinic phone number is [phone].  Please show the CHEMO ALERT CARD at check-in to the Emergency Department and triage nurse.   

## 2017-06-26 NOTE — Progress Notes (Signed)
Patient refused to have port accessed and labs drawn in flush 1, insisted on going to infusion area.  Charge nurse made aware.

## 2017-06-29 ENCOUNTER — Other Ambulatory Visit: Payer: Self-pay | Admitting: Hematology and Oncology

## 2017-06-29 DIAGNOSIS — C9002 Multiple myeloma in relapse: Secondary | ICD-10-CM

## 2017-06-29 LAB — KAPPA/LAMBDA LIGHT CHAINS
KAPPA FREE LGHT CHN: 40.6 mg/L — AB (ref 3.3–19.4)
Kappa, lambda light chain ratio: 6.25 — ABNORMAL HIGH (ref 0.26–1.65)
LAMDA FREE LIGHT CHAINS: 6.5 mg/L (ref 5.7–26.3)

## 2017-06-30 LAB — MULTIPLE MYELOMA PANEL, SERUM
ALBUMIN/GLOB SERPL: 1.9 — AB (ref 0.7–1.7)
Albumin SerPl Elph-Mcnc: 3.5 g/dL (ref 2.9–4.4)
Alpha 1: 0.3 g/dL (ref 0.0–0.4)
Alpha2 Glob SerPl Elph-Mcnc: 0.7 g/dL (ref 0.4–1.0)
B-Globulin SerPl Elph-Mcnc: 0.8 g/dL (ref 0.7–1.3)
GAMMA GLOB SERPL ELPH-MCNC: 0.2 g/dL — AB (ref 0.4–1.8)
Globulin, Total: 1.9 g/dL — ABNORMAL LOW (ref 2.2–3.9)
IGA: 13 mg/dL — AB (ref 87–352)
IgG (Immunoglobin G), Serum: 304 mg/dL — ABNORMAL LOW (ref 700–1600)
IgM (Immunoglobulin M), Srm: 12 mg/dL — ABNORMAL LOW (ref 26–217)
M Protein SerPl Elph-Mcnc: 0.1 g/dL — ABNORMAL HIGH
Total Protein ELP: 5.4 g/dL — ABNORMAL LOW (ref 6.0–8.5)

## 2017-07-06 ENCOUNTER — Telehealth: Payer: Self-pay | Admitting: *Deleted

## 2017-07-06 NOTE — Telephone Encounter (Signed)
LM with message below 

## 2017-07-06 NOTE — Telephone Encounter (Signed)
-----   Message from [name], MD sent at 07/06/2017  6:26 AM EDT ----- Regarding: labs stable PLs let her know myeloma labs with kappa/lambda ratio continues to improve I will see her as scheduled ----- Message ----- From: Interface, Lab In [place] Sent: 06/26/2017   9:20 AM To: [name], MD

## 2017-07-20 ENCOUNTER — Other Ambulatory Visit: Payer: Self-pay | Admitting: Hematology and Oncology

## 2017-07-22 ENCOUNTER — Other Ambulatory Visit: Payer: Self-pay | Admitting: *Deleted

## 2017-07-22 MED ORDER — POMALIDOMIDE 3 MG PO CAPS: ORAL_CAPSULE | ORAL | 11 refills | Status: DC

## 2017-07-24 ENCOUNTER — Other Ambulatory Visit: Payer: Self-pay

## 2017-07-24 ENCOUNTER — Inpatient Hospital Stay: Payer: 59

## 2017-07-24 ENCOUNTER — Ambulatory Visit: Payer: 59

## 2017-07-24 ENCOUNTER — Ambulatory Visit (HOSPITAL_COMMUNITY)
Admission: RE | Admit: 2017-07-24 | Discharge: 2017-07-24 | Disposition: A | Discharge status: Home / Self Care | Payer: 59 | Source: Ambulatory Visit | Attending: Hematology and Oncology | Admitting: Hematology and Oncology

## 2017-07-24 ENCOUNTER — Telehealth: Payer: Self-pay

## 2017-07-24 ENCOUNTER — Inpatient Hospital Stay (HOSPITAL_BASED_OUTPATIENT_CLINIC_OR_DEPARTMENT_OTHER): Payer: 59 | Admitting: Hematology and Oncology

## 2017-07-24 ENCOUNTER — Telehealth: Payer: Self-pay | Admitting: Hematology and Oncology

## 2017-07-24 ENCOUNTER — Encounter: Payer: Self-pay | Admitting: Hematology and Oncology

## 2017-07-24 ENCOUNTER — Inpatient Hospital Stay: Payer: 59 | Attending: Hematology and Oncology

## 2017-07-24 VITALS — BP 121/80 | HR 65 | Temp 98.4°F | Resp 17

## 2017-07-24 DIAGNOSIS — T451X5A Adverse effect of antineoplastic and immunosuppressive drugs, initial encounter: Secondary | ICD-10-CM

## 2017-07-24 DIAGNOSIS — D61810 Antineoplastic chemotherapy induced pancytopenia: Secondary | ICD-10-CM | POA: Insufficient documentation

## 2017-07-24 DIAGNOSIS — M7989 Other specified soft tissue disorders: Secondary | ICD-10-CM | POA: Diagnosis not present

## 2017-07-24 DIAGNOSIS — C9002 Multiple myeloma in relapse: Secondary | ICD-10-CM | POA: Diagnosis not present

## 2017-07-24 DIAGNOSIS — Z5112 Encounter for antineoplastic immunotherapy: Secondary | ICD-10-CM | POA: Diagnosis not present

## 2017-07-24 DIAGNOSIS — Z9221 Personal history of antineoplastic chemotherapy: Secondary | ICD-10-CM | POA: Insufficient documentation

## 2017-07-24 DIAGNOSIS — Z95828 Presence of other vascular implants and grafts: Secondary | ICD-10-CM

## 2017-07-24 LAB — COMPREHENSIVE METABOLIC PANEL
ALBUMIN: 3.8 g/dL (ref 3.5–5.0)
ALK PHOS: 49 U/L (ref 40–150)
ALT: 12 U/L (ref 0–55)
ANION GAP: 6 (ref 3–11)
AST: 18 U/L (ref 5–34)
BILIRUBIN TOTAL: 0.5 mg/dL (ref 0.2–1.2)
BUN: 11 mg/dL (ref 7–26)
CALCIUM: 8.4 mg/dL (ref 8.4–10.4)
CO2: 27 mmol/L (ref 22–29)
Chloride: 106 mmol/L (ref 98–109)
Creatinine, Ser: 0.71 mg/dL (ref 0.60–1.10)
GFR calc Af Amer: >60 mL/min (ref >60)
GLUCOSE: 81 mg/dL (ref 70–140)
Potassium: 3.6 mmol/L (ref 3.5–5.1)
Sodium: 139 mmol/L (ref 136–145)
TOTAL PROTEIN: 5.4 g/dL — AB (ref 6.4–8.3)

## 2017-07-24 LAB — CBC WITH DIFFERENTIAL/PLATELET
BASOS PCT: 4 %
Basophils Absolute: 0.1 10*3/uL (ref 0.0–0.1)
Eosinophils Absolute: 0.8 10*3/uL — ABNORMAL HIGH (ref 0.0–0.5)
Eosinophils Relative: 27 %
HEMATOCRIT: 34.5 % — AB (ref 34.8–46.6)
HEMOGLOBIN: 11.4 g/dL — AB (ref 11.6–15.9)
LYMPHS ABS: 0.9 10*3/uL (ref 0.9–3.3)
LYMPHS PCT: 29 %
MCH: 33.5 pg (ref 25.1–34.0)
MCHC: 33.1 g/dL (ref 31.5–36.0)
MCV: 101 fL (ref 79.5–101.0)
MONO ABS: 0.7 10*3/uL (ref 0.1–0.9)
Monocytes Relative: 23 %
NEUTROS ABS: 0.5 10*3/uL — AB (ref 1.5–6.5)
NEUTROS PCT: 17 %
Platelets: 87 10*3/uL — ABNORMAL LOW (ref 145–400)
RBC: 3.42 MIL/uL — ABNORMAL LOW (ref 3.70–5.45)
RDW: 15.5 % — ABNORMAL HIGH (ref 11.2–14.5)
WBC: 3.1 10*3/uL — ABNORMAL LOW (ref 3.9–10.3)

## 2017-07-24 MED ORDER — ACETAMINOPHEN 325 MG PO TABS
650.0000 mg | ORAL_TABLET | Freq: Once | ORAL | Status: AC
  Administered 2017-07-24: 650 mg via ORAL

## 2017-07-24 MED ORDER — SODIUM CHLORIDE 0.9 % IV SOLN
15.2000 mg/kg | Freq: Once | INTRAVENOUS | Status: AC
  Administered 2017-07-24: 900 mg via INTRAVENOUS
  Filled 2017-07-24: qty 40

## 2017-07-24 MED ORDER — PROCHLORPERAZINE MALEATE 10 MG PO TABS
10.0000 mg | ORAL_TABLET | Freq: Once | ORAL | Status: AC
  Administered 2017-07-24: 10 mg via ORAL

## 2017-07-24 MED ORDER — ACETAMINOPHEN 325 MG PO TABS
ORAL_TABLET | ORAL | Status: AC
  Filled 2017-07-24: qty 2

## 2017-07-24 MED ORDER — PROCHLORPERAZINE MALEATE 10 MG PO TABS
ORAL_TABLET | ORAL | Status: AC
  Filled 2017-07-24: qty 1

## 2017-07-24 MED ORDER — ZOLEDRONIC ACID 4 MG/100ML IV SOLN
4.0000 mg | Freq: Once | INTRAVENOUS | Status: DC
  Filled 2017-07-24: qty 100

## 2017-07-24 MED ORDER — SODIUM CHLORIDE 0.9% FLUSH
10.0000 mL | INTRAVENOUS | Status: DC | PRN
  Administered 2017-07-24: 10 mL
  Filled 2017-07-24: qty 10

## 2017-07-24 MED ORDER — HEPARIN SOD (PORK) LOCK FLUSH 100 UNIT/ML IV SOLN
500.0000 [IU] | Freq: Once | INTRAVENOUS | Status: AC | PRN
Start: 2017-07-24 — End: 2017-07-24
  Administered 2017-07-24: 500 [IU]
  Filled 2017-07-24: qty 5

## 2017-07-24 MED ORDER — DIPHENHYDRAMINE HCL 25 MG PO CAPS
50.0000 mg | ORAL_CAPSULE | Freq: Once | ORAL | Status: AC
  Administered 2017-07-24: 50 mg via ORAL

## 2017-07-24 MED ORDER — DIPHENHYDRAMINE HCL 25 MG PO CAPS
ORAL_CAPSULE | ORAL | Status: AC
  Filled 2017-07-24: qty 2

## 2017-07-24 MED ORDER — SODIUM CHLORIDE 0.9 % IV SOLN
20.0000 mg | Freq: Once | INTRAVENOUS | Status: AC
  Administered 2017-07-24: 20 mg via INTRAVENOUS
  Filled 2017-07-24: qty 2

## 2017-07-24 MED ORDER — SODIUM CHLORIDE 0.9 % IV SOLN
Freq: Once | INTRAVENOUS | Status: AC
  Administered 2017-07-24: 10:00:00 via INTRAVENOUS

## 2017-07-24 NOTE — Patient Instructions (Signed)

## 2017-07-24 NOTE — Assessment & Plan Note (Signed)
She is pancytopenic today but she took her last dose of Pomalyst today She is not symptomatic We discussed neutropenic precaution She can continue aspirin therapy

## 2017-07-24 NOTE — Telephone Encounter (Signed)
Per Dr Alvy Bimler inbasket:  She saw Dr. Ok Edwards at [facility] last year. He mentioned Dr. Amalia Hailey for possible clinical trial. She is interested.   Can you call Dr. Octaviano Batty office [phone] to set up referral?   Called Dr Octaviano Batty office number, reached confidential VM, left VM regarding process for referral for this patient and our contact info.

## 2017-07-24 NOTE — Progress Notes (Signed)
[place] OFFICE PROGRESS NOTE  Patient Care Team: [name], MD as PCP - General [name], MD as Consulting Physician (Oncology) [name], MD as Consulting Physician (Neurosurgery) [name], MD as Consulting Physician (Hematology and Oncology) [name], [name], MD as Referring Physician (Hematology and Oncology)  ASSESSMENT & PLAN:  Multiple myeloma in relapse [facility]) The patient is doing well and remain asymptomatic Her recent myeloma panel is stable She will continue current dose of Pomalyst at 3 mg, to be taken daily for 21 days with 7 days off  I will continue monitoring of myeloma panel carefully on a monthly basis If her repeat myeloma panel is worse, we can consider switching her over to Kyprolis, Cytoxan and dexamethasone I have also discussed with her recent clinical trials with impressive response to treatment that are available She is interested to be referred back to [facility] for further discussion and to explore clinical trials We will continue Daratumumab monthly as scheduled along with weekly dexamethasone She will continue calcium with vitamin D and Zometa every 6 months She will continue prophylactic treatment with acyclovir and aspirin  Pancytopenia due to antineoplastic chemotherapy [facility]) She is pancytopenic today but she took her last dose of Pomalyst today She is not symptomatic We discussed neutropenic precaution She can continue aspirin therapy  Right leg swelling She has new onset of right leg swelling, exacerbated by recent outdoor activities Examination revealed minimum swelling on the right leg compared to the left She also have significant varicose vein distention on exam She denies pain I recommend urgent ultrasound venous Doppler today after treatment is completed to exclude DVT She agreed with the plan of care Preliminary results suggested no evidence of DVT I will call the patient next week for further  follow-up   No orders of the defined types were placed in this encounter.   INTERVAL HISTORY: Please see below for problem oriented charting. She is seen in the infusion room with her husband Recently, she was active participating in an outdoor activity Since then, she complained the right leg swelling and occasional pain The swelling has improved significantly over the last few days She denies recent fever, cough, chest pain or shortness of breath She denies new onset of bone pain. The patient denies any recent signs or symptoms of bleeding such as spontaneous epistaxis, hematuria or hematochezia. She took the last dose of Pomalyst today  SUMMARY OF ONCOLOGIC HISTORY:   Multiple myeloma in relapse ([place])   07/21/2007 Bone Marrow Biopsy    Case #: [id number] Bone marrow showed myeloma      07/21/2007 Miscellaneous    She was diagnosed in May 2009. 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 Treatment is stopped due to minimum response and pancytopenia      10/19/2015 -  Chemotherapy    The patient had Velcade for chemotherapy treatment along with weekly Daratumumab. Last dose of Velcade on 02/29/16, stopped due to progression. Pomalidomide added on 03/14/16      04/11/2016 Adverse Reaction     Delay resumption of Pomalyst cycle 2 until 04/16/16 due to neutropenia      09/16/2016 PET scan    1. No findings of active bony lymphoma. Prior activity in the sternum and thoracic spine has resolved. 2. Focal hypermetabolic activity along the sigmoid colon with some local inflammatory stranding in the adjacent mesentery, maximum SUV 11.0. This is probably from mild acute diverticulitis. There is no hypermetabolic activity in this vicinity 4 months ago to suggest that this is from colon cancer. Correlate with any symptoms. 3. Acute right maxillary sinusitis. 4. Thoracolumbar compression fractures, vertebral augmentation at the L1 level.      09/17/2016 Procedure    CT guided bone marrow biopsy of right posterior iliac bone with both aspirate and core samples obtained.      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 - TRILINEAGE HEMATOPOIESIS. - SEE COMMENT. PERIPHERAL BLOOD: - LYMPHOPENIA. - THROMBOCYTOPENIA. Diagnosis Note The bone marrow is slightly hypocellular for age with trilineage hematopoiesis and non-specific myeloid changes likely related to previous therapy. Plasma cells are increased in number representing 8% of all cells in the aspirate, but with lack of large aggregates or sheets in the clot and biopsy sections. Immunohistochemical stains highlight the slightly increased plasma cell component in the marrow which shows kappa light chain restriction consistent with residual/recurrent plasma cell neoplasm. Correlation with cytogenetic and FISH studies recommended      09/17/2016 Pathology Results    Normal bone marrow cytogenetics and FISH       REVIEW OF SYSTEMS:   Constitutional: Denies fevers, chills or abnormal weight loss Eyes: Denies blurriness of vision Ears, nose, mouth, throat, and face: Denies mucositis or sore throat Respiratory: Denies cough, dyspnea  or wheezes  Cardiovascular: Denies palpitation, chest discomfort  Gastrointestinal:  Denies nausea, heartburn or change in bowel habits Skin: Denies abnormal skin rashes Lymphatics: Denies new lymphadenopathy or easy bruising Neurological:Denies numbness, tingling or new weaknesses Behavioral/Psych: Mood is stable, no new changes  All other systems were reviewed with the patient and are negative.  I have reviewed the past medical history, past surgical history, social history and family history with the patient and they are unchanged from previous note.  ALLERGIES:  is allergic to hydromorphone.  MEDICATIONS:  Current Outpatient Medications  Medication Sig Dispense Refill  . acyclovir (ZOVIRAX) 400 MG tablet Take 1 tablet (400 mg total) by mouth 2 (two) times daily. 60 tablet 11  . acyclovir (ZOVIRAX) 400 MG tablet TAKE 1 TABLET BY MOUTH TWICE DAILY. 60 tablet 0  . aspirin 81 MG tablet Take 81 mg by mouth.    . cholecalciferol (VITAMIN D) 1000 UNITS tablet Take 1,000 Units by mouth daily.    Marland Kitchen dexamethasone (DECADRON) 4 MG tablet TAKE 5 TABLETS ONCE WEEKLY WITH BREAKFAST EVERY TUESDAY. 20 tablet 0  . Multiple Vitamins-Minerals (ONE-A-DAY 50 PLUS PO) Take by mouth daily.    Marland Kitchen oxycodone (OXY-IR) 5 MG capsule Take 5 mg by mouth every 4 (four) hours as  needed. pain    . Oxycodone HCl (OXYCONTIN) 60 MG TB12 Take 1 tablet by mouth 2 (two) times daily.    . pantoprazole (PROTONIX) 40 MG tablet TAKE (1) TABLET DAILY AS NEEDED. 30 tablet 0  . pomalidomide (POMALYST) 3 MG capsule TAKE 1 CAPSULE (3 MG) BY MOUTH DAILY ON DAYS 1-21. REPEAT EVERY 28 DAYS. TAKE WITH WATER 21 capsule 11   No current facility-administered medications for this visit.     PHYSICAL EXAMINATION: ECOG PERFORMANCE STATUS: 1 - Symptomatic but completely ambulatory GENERAL:alert, no distress and comfortable SKIN: skin color, texture, turgor are normal, no rashes or significant lesions EYES: normal, Conjunctiva are pink and  non-injected, sclera clear OROPHARYNX:no exudate, no erythema and lips, buccal mucosa, and tongue normal  NECK: supple, thyroid normal size, non-tender, without nodularity LYMPH:  no palpable lymphadenopathy in the cervical, axillary or inguinal LUNGS: clear to auscultation and percussion with normal breathing effort HEART: regular rate & rhythm and no murmurs with mild right lower extremity edema. Noted varicose veins ABDOMEN:abdomen soft, non-tender and normal bowel sounds Musculoskeletal:no cyanosis of digits and no clubbing  NEURO: alert & oriented x 3 with fluent speech, no focal motor/sensory deficits  LABORATORY DATA:  I have reviewed the data as listed    Component Value Date/Time   NA 139 07/24/2017 0847   NA 140 03/11/2017 1150   K 3.6 07/24/2017 0847   K 3.9 03/11/2017 1150   CL 106 07/24/2017 0847   CL 107 08/30/2012 1055   CO2 27 07/24/2017 0847   CO2 29 03/11/2017 1150   GLUCOSE 81 07/24/2017 0847   GLUCOSE 93 03/11/2017 1150   GLUCOSE 102 (H) 08/30/2012 1055   BUN 11 07/24/2017 0847   BUN 12.5 03/11/2017 1150   CREATININE 0.71 07/24/2017 0847   CREATININE 0.8 03/11/2017 1150   CALCIUM 8.4 07/24/2017 0847   CALCIUM 8.7 03/11/2017 1150   PROT 5.4 (L) 07/24/2017 0847   PROT 6.0 (L) 03/11/2017 1150   ALBUMIN 3.8 07/24/2017 0847   ALBUMIN 3.8 03/11/2017 1150   AST 18 07/24/2017 0847   AST 18 03/11/2017 1150   ALT 12 07/24/2017 0847   ALT 14 03/11/2017 1150   ALKPHOS 49 07/24/2017 0847   ALKPHOS 45 03/11/2017 1150   BILITOT 0.5 07/24/2017 0847   BILITOT 0.37 03/11/2017 1150   GFRNONAA >60 07/24/2017 0847   GFRAA >60 07/24/2017 0847    No results found for: SPEP, UPEP  Lab Results  Component Value Date   WBC 3.1 (L) 07/24/2017   NEUTROABS 0.5 (L) 07/24/2017   HGB 11.4 (L) 07/24/2017   HCT 34.5 (L) 07/24/2017   MCV 101.0 07/24/2017   PLT 87 (L) 07/24/2017      Chemistry      Component Value Date/Time   NA 139 07/24/2017 0847   NA 140 03/11/2017  1150   K 3.6 07/24/2017 0847   K 3.9 03/11/2017 1150   CL 106 07/24/2017 0847   CL 107 08/30/2012 1055   CO2 27 07/24/2017 0847   CO2 29 03/11/2017 1150   BUN 11 07/24/2017 0847   BUN 12.5 03/11/2017 1150   CREATININE 0.71 07/24/2017 0847   CREATININE 0.8 03/11/2017 1150      Component Value Date/Time   CALCIUM 8.4 07/24/2017 0847   CALCIUM 8.7 03/11/2017 1150   ALKPHOS 49 07/24/2017 0847   ALKPHOS 45 03/11/2017 1150   AST 18 07/24/2017 0847   AST 18 03/11/2017 1150   ALT 12 07/24/2017 0847  ALT 14 03/11/2017 1150   BILITOT 0.5 07/24/2017 0847   BILITOT 0.37 03/11/2017 1150      All questions were answered. The patient knows to call the clinic with any problems, questions or concerns. No barriers to learning was detected.  I spent 25 minutes counseling the patient face to face. The total time spent in the appointment was 30 minutes and more than 50% was on counseling and review of test results  [name], MD 07/24/2017 2:53 PM

## 2017-07-24 NOTE — Patient Instructions (Signed)
[facility] Discharge Instructions for Patients Receiving Chemotherapy  Today you received the following chemotherapy agents:  Darzalex  To help prevent nausea and vomiting after your treatment, we encourage you to take your nausea medication as prescribed.   If you develop nausea and vomiting that is not controlled by your nausea medication, call the clinic.   BELOW ARE SYMPTOMS THAT SHOULD BE REPORTED IMMEDIATELY:  *FEVER GREATER THAN 100.5 F  *CHILLS WITH OR WITHOUT FEVER  NAUSEA AND VOMITING THAT IS NOT CONTROLLED WITH YOUR NAUSEA MEDICATION  *UNUSUAL SHORTNESS OF BREATH  *UNUSUAL BRUISING OR BLEEDING  TENDERNESS IN MOUTH AND THROAT WITH OR WITHOUT PRESENCE OF ULCERS  *URINARY PROBLEMS  *BOWEL PROBLEMS  UNUSUAL RASH Items with * indicate a potential emergency and should be followed up as soon as possible.  Feel free to call the clinic should you have any questions or concerns. The clinic phone number is [phone].  Please show the CHEMO ALERT CARD at check-in to the Emergency Department and triage nurse.   

## 2017-07-24 NOTE — Progress Notes (Signed)
Preliminary notes--Right lower extremity venous duplex exam completed. Negative for DVT.  Result called Dr. Alvy Bimler and patient was instructed go home.   Jessica Cochran (RDMS RVT) 07/24/17 2:55 PM

## 2017-07-24 NOTE — Telephone Encounter (Signed)
Gave patient AVs and calendar of upcoming June appointments. °

## 2017-07-24 NOTE — Assessment & Plan Note (Signed)
The patient is doing well and remain asymptomatic Her recent myeloma panel is stable She will continue current dose of Pomalyst at 3 mg, to be taken daily for 21 days with 7 days off  I will continue monitoring of myeloma panel carefully on a monthly basis If her repeat myeloma panel is worse, we can consider switching her over to Kyprolis, Cytoxan and dexamethasone I have also discussed with her recent clinical trials with impressive response to treatment that are available She is interested to be referred back to [facility] for further discussion and to explore clinical trials We will continue Daratumumab monthly as scheduled along with weekly dexamethasone She will continue calcium with vitamin D and Zometa every 6 months She will continue prophylactic treatment with acyclovir and aspirin

## 2017-07-24 NOTE — Telephone Encounter (Addendum)
Per Dr Alvy Bimler, order for doppler to r/o DVT of right lower extremity has been placed.  Notified vascular lab "Butch Penny" so she is aware pt's infusion appt should be completed by 2:30 pm and then pt can go to vascular lab- appt made. Made Amy RN charge nurse in infusion aware.   Received inbasket from Dr Otelia Sergeant making Dr Alvy Bimler aware of right leg swelling.  Per Dr Alvy Bimler:    I have addressed this and have ordered US DVT this afternoon  Proceed with treatment   I notified Hannah RN in infusion and she will let pt know about her appt at [facility] for doppler at 3 pm today.

## 2017-07-24 NOTE — Assessment & Plan Note (Addendum)
She has new onset of right leg swelling, exacerbated by recent outdoor activities Examination revealed minimum swelling on the right leg compared to the left She also have significant varicose vein distention on exam She denies pain I recommend urgent ultrasound venous Doppler today after treatment is completed to exclude DVT She agreed with the plan of care Preliminary results suggested no evidence of DVT I will call the patient next week for further follow-up

## 2017-07-27 ENCOUNTER — Telehealth: Payer: Self-pay

## 2017-07-27 ENCOUNTER — Other Ambulatory Visit: Payer: Self-pay

## 2017-07-27 LAB — KAPPA/LAMBDA LIGHT CHAINS
KAPPA, LAMDA LIGHT CHAIN RATIO: 11.69 — AB (ref 0.26–1.65)
Kappa free light chain: 40.9 mg/L — ABNORMAL HIGH (ref 3.3–19.4)
LAMDA FREE LIGHT CHAINS: 3.5 mg/L — AB (ref 5.7–26.3)

## 2017-07-27 NOTE — Telephone Encounter (Signed)
Per Dr Alvy Bimler:   Can you call patient and ask how is her leg doing?  Any updates on referral back to [facility]?     I called Dr Octaviano Batty office about the referral with Dr Amalia Hailey possible clinical trial last Friday and left a VM- did not hear back so I called today and they gave me their fax number to send demographic info/ latest dr notes [phone]- sent - and now waiting to hear from "McKenzie" who handles referrals in their office- left her a VM as well.   Ongoing call to patient, she reports her leg is still swollen a little but nothing like it was, is walking,  and reports it is peace of mind to know that there wasn't a clot.  I reminded her to call for any worsening symptoms, she has appts here on 08-21-2017. I let her know that I was waiting to hear back on referral.  No other needs per pt at this time.

## 2017-07-27 NOTE — Progress Notes (Signed)
No entry 

## 2017-07-30 LAB — MULTIPLE MYELOMA PANEL, SERUM
ALBUMIN/GLOB SERPL: 2.4 — AB (ref 0.7–1.7)
ALPHA2 GLOB SERPL ELPH-MCNC: 0.5 g/dL (ref 0.4–1.0)
Albumin SerPl Elph-Mcnc: 3.5 g/dL (ref 2.9–4.4)
Alpha 1: 0.2 g/dL (ref 0.0–0.4)
B-GLOBULIN SERPL ELPH-MCNC: 0.7 g/dL (ref 0.7–1.3)
Gamma Glob SerPl Elph-Mcnc: 0.2 g/dL — ABNORMAL LOW (ref 0.4–1.8)
Globulin, Total: 1.5 g/dL — ABNORMAL LOW (ref 2.2–3.9)
IGG (IMMUNOGLOBIN G), SERUM: 279 mg/dL — AB (ref 700–1600)
IGM (IMMUNOGLOBULIN M), SRM: 10 mg/dL — AB (ref 26–217)
IgA: 11 mg/dL — ABNORMAL LOW (ref 87–352)
M Protein SerPl Elph-Mcnc: 0.1 g/dL — ABNORMAL HIGH
Total Protein ELP: 5 g/dL — ABNORMAL LOW (ref 6.0–8.5)

## 2017-08-04 ENCOUNTER — Telehealth: Payer: Self-pay | Admitting: *Deleted

## 2017-08-04 NOTE — Telephone Encounter (Signed)
-----   Message from [name], MD sent at 08/04/2017  8:23 AM EDT ----- Regarding: ligh chains Pls call her: light chain studies are stable Can you check the status of referral to [facility]? ----- Message ----- From: Interface, Lab In [place] Sent: 07/24/2017   9:03 AM To: [name], MD

## 2017-08-04 NOTE — Telephone Encounter (Signed)
Spoke with Diane regarding message below. Has not heard from [facility]

## 2017-08-07 ENCOUNTER — Telehealth: Payer: Self-pay

## 2017-08-07 ENCOUNTER — Other Ambulatory Visit: Payer: Self-pay | Admitting: Hematology and Oncology

## 2017-08-07 NOTE — Telephone Encounter (Signed)
[facility] bone marrow transplant called and left a message that patient has appt for 7/3 at 0900. She will send a fax also.

## 2017-08-12 ENCOUNTER — Other Ambulatory Visit: Payer: Self-pay | Admitting: Hematology and Oncology

## 2017-08-17 ENCOUNTER — Other Ambulatory Visit: Payer: Self-pay | Admitting: Hematology and Oncology

## 2017-08-18 ENCOUNTER — Other Ambulatory Visit: Payer: Self-pay | Admitting: *Deleted

## 2017-08-18 MED ORDER — POMALIDOMIDE 3 MG PO CAPS: ORAL_CAPSULE | ORAL | 11 refills | Status: DC

## 2017-08-21 ENCOUNTER — Inpatient Hospital Stay (HOSPITAL_BASED_OUTPATIENT_CLINIC_OR_DEPARTMENT_OTHER): Payer: 59 | Admitting: Hematology and Oncology

## 2017-08-21 ENCOUNTER — Encounter: Payer: Self-pay | Admitting: Hematology and Oncology

## 2017-08-21 ENCOUNTER — Inpatient Hospital Stay: Payer: 59

## 2017-08-21 ENCOUNTER — Inpatient Hospital Stay: Payer: 59 | Attending: Hematology and Oncology

## 2017-08-21 VITALS — BP 125/72 | HR 75 | Temp 98.0°F | Resp 16

## 2017-08-21 DIAGNOSIS — G8929 Other chronic pain: Secondary | ICD-10-CM | POA: Diagnosis not present

## 2017-08-21 DIAGNOSIS — C9002 Multiple myeloma in relapse: Secondary | ICD-10-CM | POA: Insufficient documentation

## 2017-08-21 DIAGNOSIS — Z79899 Other long term (current) drug therapy: Secondary | ICD-10-CM

## 2017-08-21 DIAGNOSIS — D61810 Antineoplastic chemotherapy induced pancytopenia: Secondary | ICD-10-CM | POA: Diagnosis not present

## 2017-08-21 DIAGNOSIS — Z95828 Presence of other vascular implants and grafts: Secondary | ICD-10-CM

## 2017-08-21 DIAGNOSIS — T451X5A Adverse effect of antineoplastic and immunosuppressive drugs, initial encounter: Secondary | ICD-10-CM

## 2017-08-21 DIAGNOSIS — Z5112 Encounter for antineoplastic immunotherapy: Secondary | ICD-10-CM | POA: Insufficient documentation

## 2017-08-21 LAB — CBC WITH DIFFERENTIAL/PLATELET
Basophils Absolute: 0 10*3/uL (ref 0.0–0.1)
Basophils Relative: 1 %
EOS ABS: 0 10*3/uL (ref 0.0–0.5)
EOS PCT: 0 %
HCT: 37.5 % (ref 34.8–46.6)
HEMOGLOBIN: 12.3 g/dL (ref 11.6–15.9)
LYMPHS ABS: 0.4 10*3/uL — AB (ref 0.9–3.3)
LYMPHS PCT: 17 %
MCH: 33.2 pg (ref 25.1–34.0)
MCHC: 32.8 g/dL (ref 31.5–36.0)
MCV: 101.3 fL — AB (ref 79.5–101.0)
MONOS PCT: 25 %
Monocytes Absolute: 0.6 10*3/uL (ref 0.1–0.9)
NEUTROS PCT: 57 %
Neutro Abs: 1.5 10*3/uL (ref 1.5–6.5)
Platelets: 97 10*3/uL — ABNORMAL LOW (ref 145–400)
RBC: 3.7 MIL/uL (ref 3.70–5.45)
RDW: 15.6 % — ABNORMAL HIGH (ref 11.2–14.5)
WBC: 2.6 10*3/uL — AB (ref 3.9–10.3)

## 2017-08-21 LAB — COMPREHENSIVE METABOLIC PANEL
ALT: 11 U/L (ref 0–55)
ANION GAP: 9 (ref 3–11)
AST: 17 U/L (ref 5–34)
Albumin: 4.2 g/dL (ref 3.5–5.0)
Alkaline Phosphatase: 46 U/L (ref 40–150)
BILIRUBIN TOTAL: 0.8 mg/dL (ref 0.2–1.2)
BUN: 13 mg/dL (ref 7–26)
CALCIUM: 9.1 mg/dL (ref 8.4–10.4)
CO2: 25 mmol/L (ref 22–29)
Chloride: 106 mmol/L (ref 98–109)
Creatinine, Ser: 0.77 mg/dL (ref 0.60–1.10)
Glucose, Bld: 97 mg/dL (ref 70–140)
Potassium: 4 mmol/L (ref 3.5–5.1)
Sodium: 140 mmol/L (ref 136–145)
TOTAL PROTEIN: 6 g/dL — AB (ref 6.4–8.3)

## 2017-08-21 MED ORDER — DIPHENHYDRAMINE HCL 25 MG PO CAPS
50.0000 mg | ORAL_CAPSULE | Freq: Once | ORAL | Status: AC
  Administered 2017-08-21: 50 mg via ORAL

## 2017-08-21 MED ORDER — ACETAMINOPHEN 325 MG PO TABS
650.0000 mg | ORAL_TABLET | Freq: Once | ORAL | Status: AC
  Administered 2017-08-21: 650 mg via ORAL

## 2017-08-21 MED ORDER — SODIUM CHLORIDE 0.9 % IV SOLN
15.2000 mg/kg | Freq: Once | INTRAVENOUS | Status: AC
  Administered 2017-08-21: 900 mg via INTRAVENOUS
  Filled 2017-08-21: qty 40

## 2017-08-21 MED ORDER — PROCHLORPERAZINE MALEATE 10 MG PO TABS
ORAL_TABLET | ORAL | Status: AC
  Filled 2017-08-21: qty 1

## 2017-08-21 MED ORDER — SODIUM CHLORIDE 0.9 % IJ SOLN
10.0000 mL | INTRAMUSCULAR | Status: DC | PRN
  Filled 2017-08-21: qty 10

## 2017-08-21 MED ORDER — PROCHLORPERAZINE MALEATE 10 MG PO TABS
10.0000 mg | ORAL_TABLET | Freq: Once | ORAL | Status: AC
  Administered 2017-08-21: 10 mg via ORAL

## 2017-08-21 MED ORDER — SODIUM CHLORIDE 0.9% FLUSH
10.0000 mL | INTRAVENOUS | Status: DC | PRN
  Administered 2017-08-21: 10 mL
  Filled 2017-08-21: qty 10

## 2017-08-21 MED ORDER — SODIUM CHLORIDE 0.9 % IV SOLN
Freq: Once | INTRAVENOUS | Status: AC
  Administered 2017-08-21: 11:00:00 via INTRAVENOUS

## 2017-08-21 MED ORDER — DIPHENHYDRAMINE HCL 25 MG PO CAPS
ORAL_CAPSULE | ORAL | Status: AC
Start: 2017-08-21 — End: ?
  Filled 2017-08-21: qty 2

## 2017-08-21 MED ORDER — SODIUM CHLORIDE 0.9 % IV SOLN
20.0000 mg | Freq: Once | INTRAVENOUS | Status: AC
  Administered 2017-08-21: 20 mg via INTRAVENOUS
  Filled 2017-08-21: qty 2

## 2017-08-21 MED ORDER — ZOLEDRONIC ACID 4 MG/100ML IV SOLN
4.0000 mg | Freq: Once | INTRAVENOUS | Status: AC
  Administered 2017-08-21: 4 mg via INTRAVENOUS
  Filled 2017-08-21: qty 100

## 2017-08-21 MED ORDER — ACETAMINOPHEN 325 MG PO TABS
ORAL_TABLET | ORAL | Status: AC
  Filled 2017-08-21: qty 1

## 2017-08-21 MED ORDER — HEPARIN SOD (PORK) LOCK FLUSH 100 UNIT/ML IV SOLN
500.0000 [IU] | Freq: Once | INTRAVENOUS | Status: AC | PRN
  Administered 2017-08-21: 500 [IU]
  Filled 2017-08-21: qty 5

## 2017-08-21 MED ORDER — ACYCLOVIR 400 MG PO TABS: 400.0000 mg | ORAL_TABLET | Freq: Every day | ORAL | 3 refills | Status: DC

## 2017-08-21 NOTE — Assessment & Plan Note (Signed)
The patient is doing well and remain asymptomatic Her recent myeloma panel is stable She will continue current dose of Pomalyst at 3 mg, to be taken daily for 21 days with 7 days off  I will continue monitoring of myeloma panel carefully on a monthly basis If her repeat myeloma panel is worse, we can consider switching her over to Kyprolis, Cytoxan and dexamethasone I have also discussed with her recent clinical trials with impressive response to treatment that are available She is interested to be referred back to [facility] for further discussion and to explore clinical trials We will continue Daratumumab monthly as scheduled along with weekly dexamethasone She will continue calcium with vitamin D and Zometa every 6 months She will continue prophylactic treatment with acyclovir and aspirin

## 2017-08-21 NOTE — Progress Notes (Signed)
Pt  Requested to be accessed in the Infusion Room

## 2017-08-21 NOTE — Patient Instructions (Signed)
[facility] Discharge Instructions for Patients Receiving Chemotherapy  Today you received the following chemotherapy agents:  Darzalex  To help prevent nausea and vomiting after your treatment, we encourage you to take your nausea medication as prescribed.   If you develop nausea and vomiting that is not controlled by your nausea medication, call the clinic.   BELOW ARE SYMPTOMS THAT SHOULD BE REPORTED IMMEDIATELY:  *FEVER GREATER THAN 100.5 F  *CHILLS WITH OR WITHOUT FEVER  NAUSEA AND VOMITING THAT IS NOT CONTROLLED WITH YOUR NAUSEA MEDICATION  *UNUSUAL SHORTNESS OF BREATH  *UNUSUAL BRUISING OR BLEEDING  TENDERNESS IN MOUTH AND THROAT WITH OR WITHOUT PRESENCE OF ULCERS  *URINARY PROBLEMS  *BOWEL PROBLEMS  UNUSUAL RASH Items with * indicate a potential emergency and should be followed up as soon as possible.  Feel free to call the clinic should you have any questions or concerns. The clinic phone number is [phone].  Please show the CHEMO ALERT CARD at check-in to the Emergency Department and triage nurse.    Zoledronic Acid injection (Hypercalcemia, Oncology) What is this medicine? ZOLEDRONIC ACID (ZOE le dron ik AS id) lowers the amount of calcium loss from bone. It is used to treat too much calcium in your blood from cancer. It is also used to prevent complications of cancer that has spread to the bone. This medicine may be used for other purposes; ask your health care provider or pharmacist if you have questions. COMMON BRAND NAME(S): Zometa What should I tell my health care provider before I take this medicine? They need to know if you have any of these conditions: -aspirin-sensitive asthma -cancer, especially if you are receiving medicines used to treat cancer -dental disease or wear dentures -infection -kidney disease -receiving corticosteroids like dexamethasone or prednisone -an unusual or allergic reaction to zoledronic acid, other  medicines, foods, dyes, or preservatives -pregnant or trying to get pregnant -breast-feeding How should I use this medicine? This medicine is for infusion into a vein. It is given by a health care professional in a hospital or clinic setting. Talk to your pediatrician regarding the use of this medicine in children. Special care may be needed. Overdosage: If you think you have taken too much of this medicine contact a poison control center or emergency room at once. NOTE: This medicine is only for you. Do not share this medicine with others. What if I miss a dose? It is important not to miss your dose. Call your doctor or health care professional if you are unable to keep an appointment. What may interact with this medicine? -certain antibiotics given by injection -NSAIDs, medicines for pain and inflammation, like ibuprofen or naproxen -some diuretics like bumetanide, furosemide -teriparatide -thalidomide This list may not describe all possible interactions. Give your health care provider a list of all the medicines, herbs, non-prescription drugs, or dietary supplements you use. Also tell them if you smoke, drink alcohol, or use illegal drugs. Some items may interact with your medicine. What should I watch for while using this medicine? Visit your doctor or health care professional for regular checkups. It may be some time before you see the benefit from this medicine. Do not stop taking your medicine unless your doctor tells you to. Your doctor may order blood tests or other tests to see how you are doing. Women should inform their doctor if they wish to become pregnant or think they might be pregnant. There is a potential for serious side effects to an unborn   child. Talk to your health care professional or pharmacist for more information. You should make sure that you get enough calcium and vitamin D while you are taking this medicine. Discuss the foods you eat and the vitamins you take with  your health care professional. Some people who take this medicine have severe bone, joint, and/or muscle pain. This medicine may also increase your risk for jaw problems or a broken thigh bone. Tell your doctor right away if you have severe pain in your jaw, bones, joints, or muscles. Tell your doctor if you have any pain that does not go away or that gets worse. Tell your dentist and dental surgeon that you are taking this medicine. You should not have major dental surgery while on this medicine. See your dentist to have a dental exam and fix any dental problems before starting this medicine. Take good care of your teeth while on this medicine. Make sure you see your dentist for regular follow-up appointments. What side effects may I notice from receiving this medicine? Side effects that you should report to your doctor or health care professional as soon as possible: -allergic reactions like skin rash, itching or hives, swelling of the face, lips, or tongue -anxiety, confusion, or depression -breathing problems -changes in vision -eye pain -feeling faint or lightheaded, falls -jaw pain, especially after dental work -mouth sores -muscle cramps, stiffness, or weakness -redness, blistering, peeling or loosening of the skin, including inside the mouth -trouble passing urine or change in the amount of urine Side effects that usually do not require medical attention (report to your doctor or health care professional if they continue or are bothersome): -bone, joint, or muscle pain -constipation -diarrhea -fever -hair loss -irritation at site where injected -loss of appetite -nausea, vomiting -stomach upset -trouble sleeping -trouble swallowing -weak or tired This list may not describe all possible side effects. Call your doctor for medical advice about side effects. You may report side effects to FDA at 1-800-FDA-1088. Where should I keep my medicine? This drug is given in a hospital or  clinic and will not be stored at home. NOTE: This sheet is a summary. It may not cover all possible information. If you have questions about this medicine, talk to your doctor, pharmacist, or health care provider.  2018 Elsevier/Gold Standard (2013-07-23 14:19:39)

## 2017-08-21 NOTE — Progress Notes (Signed)
[place] OFFICE PROGRESS NOTE  Patient Care Team: [name], MD as PCP - General [name], MD as Consulting Physician (Oncology) [name], MD as Consulting Physician (Neurosurgery) [name], MD as Consulting Physician (Hematology and Oncology) [name], [name], MD as Referring Physician (Hematology and Oncology)  ASSESSMENT & PLAN:  Multiple myeloma in relapse [facility]) The patient is doing well and remain asymptomatic Her recent myeloma panel is stable She will continue current dose of Pomalyst at 3 mg, to be taken daily for 21 days with 7 days off  I will continue monitoring of myeloma panel carefully on a monthly basis If her repeat myeloma panel is worse, we can consider switching her over to Kyprolis, Cytoxan and dexamethasone I have also discussed with her recent clinical trials with impressive response to treatment that are available She is interested to be referred back to [facility] for further discussion and to explore clinical trials We will continue Daratumumab monthly as scheduled along with weekly dexamethasone She will continue calcium with vitamin D and Zometa every 6 months She will continue prophylactic treatment with acyclovir and aspirin  Pancytopenia due to antineoplastic chemotherapy [facility]) She is pancytopenic today but she took her last dose of Pomalyst yesterday She is not symptomatic We discussed neutropenic precaution She can continue aspirin therapy as long as platelet count is >50,000   No orders of the defined types were placed in this encounter.   INTERVAL HISTORY: Please see below for problem oriented charting. She is seen in the infusion room with her husband presents She denies infusion reactions She tolerated chemotherapy well Denies recent infection, fever or chills No recent cough Her chronic pain is stable The patient denies any recent signs or symptoms of bleeding such as spontaneous epistaxis, hematuria  or hematochezia. She is anxious with her upcoming second opinion visit at [facility] on September 09, 2017  SUMMARY OF ONCOLOGIC HISTORY:   Multiple myeloma in relapse ([place])   07/21/2007 Bone Marrow Biopsy    Case #: [id number] Bone marrow showed myeloma      07/21/2007 Miscellaneous    She was diagnosed in May 2009. Had several cycles of RVD      10/14/2007 Bone Marrow Biopsy    Case #: [id number] repeat bone marrow biopsy showed good response to Rx      12/31/2007 Bone Marrow Transplant    She had autologous BMT at [facility]      07/19/2009 Bone Marrow Biopsy    Case #: [id number] Bone marrow biopsy showed only 1% plasma cells      10/10/2010 Bone Marrow Biopsy    [id number] Bone marrow biopsy showed 2% plasma cells      09/25/2011 Bone Marrow Biopsy    Accession #: [id number] Bone marrow only showed 4% plasma cells with ligh chain excess      10/14/2011 - 06/13/2013 Chemotherapy    She received Revlimid only, discontinued due to pancytopenia      06/18/2015 - 09/13/2015 Chemotherapy    She resumed taking Revlimid and dexamethasone. Treatment is stopped due to minimum response and pancytopenia      10/19/2015 -  Chemotherapy    The patient had Velcade for chemotherapy treatment along with weekly Daratumumab. Last dose of Velcade on 02/29/16, stopped due to progression. Pomalidomide added on 03/14/16      04/11/2016 Adverse Reaction    Delay resumption of Pomalyst cycle 2 until 04/16/16 due to neutropenia      09/16/2016 PET scan  1. No findings of active bony lymphoma. Prior activity in the sternum and thoracic spine has resolved. 2. Focal hypermetabolic activity along the sigmoid colon with some local inflammatory stranding in the adjacent mesentery, maximum SUV 11.0. This is probably from mild acute diverticulitis. There is no hypermetabolic activity in this vicinity 4 months ago to suggest that this is from colon cancer. Correlate with any symptoms. 3. Acute right maxillary  sinusitis. 4. Thoracolumbar compression fractures, vertebral augmentation at the L1 level.      09/17/2016 Procedure    CT guided bone marrow biopsy of right posterior iliac bone with both aspirate and core samples obtained.      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 - TRILINEAGE HEMATOPOIESIS. - SEE COMMENT. PERIPHERAL BLOOD: - LYMPHOPENIA. - THROMBOCYTOPENIA. Diagnosis Note The bone marrow is slightly hypocellular for age with trilineage hematopoiesis and non-specific myeloid changes likely related to previous therapy. Plasma cells are increased in number representing 8% of all cells in the aspirate, but with lack of large aggregates or sheets in the clot and biopsy sections. Immunohistochemical stains highlight the slightly increased plasma cell component in the marrow which shows kappa light chain restriction consistent with residual/recurrent plasma cell neoplasm. Correlation with cytogenetic and FISH studies recommended      09/17/2016 Pathology Results    Normal bone marrow cytogenetics and FISH       REVIEW OF SYSTEMS:   Constitutional: Denies fevers, chills or abnormal weight loss Eyes: Denies blurriness of vision Ears, nose, mouth, throat, and face: Denies mucositis or sore throat Respiratory: Denies cough, dyspnea or wheezes Cardiovascular: Denies palpitation, chest discomfort or lower extremity swelling Gastrointestinal:  Denies nausea, heartburn or change in bowel habits Skin: Denies abnormal skin rashes Lymphatics: Denies new lymphadenopathy or easy bruising Neurological:Denies numbness, tingling or new weaknesses Behavioral/Psych: Mood is stable, no new changes  All other systems were reviewed with the patient and are negative.  I have reviewed the past medical history, past surgical history, social history and family history with the patient and  they are unchanged from previous note.  ALLERGIES:  is allergic to hydromorphone.  MEDICATIONS:  Current Outpatient Medications  Medication Sig Dispense Refill  . acyclovir (ZOVIRAX) 400 MG tablet TAKE 1 TABLET BY MOUTH TWICE DAILY. 60 tablet 0  . acyclovir (ZOVIRAX) 400 MG tablet Take 1 tablet (400 mg total) by mouth daily. 90 tablet 3  . aspirin 81 MG tablet Take 81 mg by mouth.    . cholecalciferol (VITAMIN D) 1000 UNITS tablet Take 1,000 Units by mouth daily.    Marland Kitchen dexamethasone (DECADRON) 4 MG tablet TAKE 5 TABLETS ONCE WEEKLY WITH BREAKFAST EVERY TUESDAY. 20 tablet 0  . dexamethasone (DECADRON) 4 MG tablet TAKE 5 TABLETS ONCE WEEKLY WITH BREAKFAST EVERY TUESDAY. 20 tablet 0  . Multiple Vitamins-Minerals (ONE-A-DAY 50 PLUS PO) Take by mouth daily.    Marland Kitchen oxycodone (OXY-IR) 5 MG capsule Take 5 mg by mouth every 4 (four) hours as needed. pain    . Oxycodone HCl (OXYCONTIN) 60 MG TB12 Take 1 tablet by mouth 2 (two) times daily.    . pantoprazole (PROTONIX) 40 MG tablet TAKE (1) TABLET DAILY AS NEEDED. 30 tablet 0  . pomalidomide (POMALYST) 3 MG capsule TAKE 1 CAPSULE BY MOUTH ONCE DAILY ON DAYS 1-21 EVERY 28 DAYS 21 capsule 11   No current facility-administered medications for this visit.  Facility-Administered Medications Ordered in Other Visits  Medication Dose Route Frequency Provider Last Rate Last Dose  . heparin lock flush 100 unit/mL  500 Units Intracatheter Once PRN Alvy Bimler, Yeilin Zweber, MD      . sodium chloride flush (NS) 0.9 % injection 10 mL  10 mL Intracatheter PRN Alvy Bimler, Ceonna Frazzini, MD        PHYSICAL EXAMINATION: ECOG PERFORMANCE STATUS: 1 - Symptomatic but completely ambulatory GENERAL:alert, no distress and comfortable SKIN: skin color, texture, turgor are normal, no rashes or significant lesions EYES: normal, Conjunctiva are pink and non-injected, sclera clear OROPHARYNX:no exudate, no erythema and lips, buccal mucosa, and tongue normal  NECK: supple, thyroid normal size,  non-tender, without nodularity LYMPH:  no palpable lymphadenopathy in the cervical, axillary or inguinal LUNGS: clear to auscultation and percussion with normal breathing effort HEART: regular rate & rhythm and no murmurs and no lower extremity edema ABDOMEN:abdomen soft, non-tender and normal bowel sounds Musculoskeletal:no cyanosis of digits and no clubbing  NEURO: alert & oriented x 3 with fluent speech, no focal motor/sensory deficits  LABORATORY DATA:  I have reviewed the data as listed    Component Value Date/Time   NA 140 08/21/2017 0915   NA 140 03/11/2017 1150   K 4.0 08/21/2017 0915   K 3.9 03/11/2017 1150   CL 106 08/21/2017 0915   CL 107 08/30/2012 1055   CO2 25 08/21/2017 0915   CO2 29 03/11/2017 1150   GLUCOSE 97 08/21/2017 0915   GLUCOSE 93 03/11/2017 1150   GLUCOSE 102 (H) 08/30/2012 1055   BUN 13 08/21/2017 0915   BUN 12.5 03/11/2017 1150   CREATININE 0.77 08/21/2017 0915   CREATININE 0.8 03/11/2017 1150   CALCIUM 9.1 08/21/2017 0915   CALCIUM 8.7 03/11/2017 1150   PROT 6.0 (L) 08/21/2017 0915   PROT 6.0 (L) 03/11/2017 1150   ALBUMIN 4.2 08/21/2017 0915   ALBUMIN 3.8 03/11/2017 1150   AST 17 08/21/2017 0915   AST 18 03/11/2017 1150   ALT 11 08/21/2017 0915   ALT 14 03/11/2017 1150   ALKPHOS 46 08/21/2017 0915   ALKPHOS 45 03/11/2017 1150   BILITOT 0.8 08/21/2017 0915   BILITOT 0.37 03/11/2017 1150   GFRNONAA >60 08/21/2017 0915   GFRAA >60 08/21/2017 0915    No results found for: SPEP, UPEP  Lab Results  Component Value Date   WBC 2.6 (L) 08/21/2017   NEUTROABS 1.5 08/21/2017   HGB 12.3 08/21/2017   HCT 37.5 08/21/2017   MCV 101.3 (H) 08/21/2017   PLT 97 (L) 08/21/2017      Chemistry      Component Value Date/Time   NA 140 08/21/2017 0915   NA 140 03/11/2017 1150   K 4.0 08/21/2017 0915   K 3.9 03/11/2017 1150   CL 106 08/21/2017 0915   CL 107 08/30/2012 1055   CO2 25 08/21/2017 0915   CO2 29 03/11/2017 1150   BUN 13 08/21/2017 0915    BUN 12.5 03/11/2017 1150   CREATININE 0.77 08/21/2017 0915   CREATININE 0.8 03/11/2017 1150      Component Value Date/Time   CALCIUM 9.1 08/21/2017 0915   CALCIUM 8.7 03/11/2017 1150   ALKPHOS 46 08/21/2017 0915   ALKPHOS 45 03/11/2017 1150   AST 17 08/21/2017 0915   AST 18 03/11/2017 1150   ALT 11 08/21/2017 0915   ALT 14 03/11/2017 1150   BILITOT 0.8 08/21/2017 0915   BILITOT 0.37 03/11/2017 1150      All questions were  answered. The patient knows to call the clinic with any problems, questions or concerns. No barriers to learning was detected.  I spent 15 minutes counseling the patient face to face. The total time spent in the appointment was 20 minutes and more than 50% was on counseling and review of test results  [name], MD 08/21/2017 1:10 PM

## 2017-08-21 NOTE — Assessment & Plan Note (Signed)
She is pancytopenic today but she took her last dose of Pomalyst yesterday She is not symptomatic We discussed neutropenic precaution She can continue aspirin therapy as long as platelet count is >50,000

## 2017-08-24 ENCOUNTER — Telehealth: Payer: Self-pay | Admitting: *Deleted

## 2017-08-24 LAB — KAPPA/LAMBDA LIGHT CHAINS
KAPPA FREE LGHT CHN: 37.3 mg/L — AB (ref 3.3–19.4)
Kappa, lambda light chain ratio: 14.35 — ABNORMAL HIGH (ref 0.26–1.65)
Lambda free light chains: 2.6 mg/L — ABNORMAL LOW (ref 5.7–26.3)

## 2017-08-24 NOTE — Telephone Encounter (Signed)
-----   Message from [name], MD sent at 08/24/2017  1:06 PM EDT ----- Regarding: light chain results PLs let her know total light chains are a bit better, overall stable ratio ----- Message ----- From: Interface, Lab In [facility] Sent: 08/21/2017   9:29 AM To: [name], MD

## 2017-08-24 NOTE — Telephone Encounter (Signed)
Notified of message below

## 2017-08-25 LAB — MULTIPLE MYELOMA PANEL, SERUM
ALBUMIN SERPL ELPH-MCNC: 4 g/dL (ref 2.9–4.4)
ALPHA 1: 0.2 g/dL (ref 0.0–0.4)
Albumin/Glob SerPl: 2.4 — ABNORMAL HIGH (ref 0.7–1.7)
Alpha2 Glob SerPl Elph-Mcnc: 0.5 g/dL (ref 0.4–1.0)
B-Globulin SerPl Elph-Mcnc: 0.7 g/dL (ref 0.7–1.3)
Gamma Glob SerPl Elph-Mcnc: 0.2 g/dL — ABNORMAL LOW (ref 0.4–1.8)
Globulin, Total: 1.7 g/dL — ABNORMAL LOW (ref 2.2–3.9)
IGA: 8 mg/dL — AB (ref 87–352)
IGG (IMMUNOGLOBIN G), SERUM: 308 mg/dL — AB (ref 700–1600)
IGM (IMMUNOGLOBULIN M), SRM: 10 mg/dL — AB (ref 26–217)
M Protein SerPl Elph-Mcnc: 0.1 g/dL — ABNORMAL HIGH
TOTAL PROTEIN ELP: 5.7 g/dL — AB (ref 6.0–8.5)

## 2017-09-03 ENCOUNTER — Telehealth: Payer: Self-pay

## 2017-09-03 NOTE — Telephone Encounter (Signed)
He called and left a message to call him. Called back.   Called back. He is concerned about his wife not being assigned the H section for the infusion room on the next appt. Told him that with the new scheduling method you cannot assign a section in the infusion room. The scheduler put comments in the appts that the H section is requested. He verbalized understanding.

## 2017-09-09 ENCOUNTER — Other Ambulatory Visit: Payer: Self-pay

## 2017-09-09 MED ORDER — POMALIDOMIDE 3 MG PO CAPS: ORAL_CAPSULE | ORAL | 11 refills | Status: DC

## 2017-09-10 DIAGNOSIS — D61818 Other pancytopenia: Secondary | ICD-10-CM | POA: Insufficient documentation

## 2017-09-11 ENCOUNTER — Other Ambulatory Visit: Payer: Self-pay | Admitting: Hematology and Oncology

## 2017-09-11 ENCOUNTER — Telehealth: Payer: Self-pay | Admitting: *Deleted

## 2017-09-11 NOTE — Telephone Encounter (Signed)
She does want to keep her appt next week. She is OK to talk in the infusion room.

## 2017-09-11 NOTE — Telephone Encounter (Signed)
-----   Message from [name], MD sent at 09/11/2017 11:07 AM EDT ----- Regarding: plan next week I have received copies of her recent visit at [facility] Does she want to keep her appt next week? Does she want a more private conversation in my office instead of talking in the infusion room?

## 2017-09-18 ENCOUNTER — Inpatient Hospital Stay: Payer: 59 | Attending: Hematology and Oncology

## 2017-09-18 ENCOUNTER — Encounter: Payer: Self-pay | Admitting: Hematology and Oncology

## 2017-09-18 ENCOUNTER — Inpatient Hospital Stay: Payer: 59

## 2017-09-18 ENCOUNTER — Inpatient Hospital Stay (HOSPITAL_BASED_OUTPATIENT_CLINIC_OR_DEPARTMENT_OTHER): Payer: 59 | Admitting: Hematology and Oncology

## 2017-09-18 ENCOUNTER — Telehealth: Payer: Self-pay | Admitting: Hematology and Oncology

## 2017-09-18 VITALS — BP 122/71 | HR 85 | Temp 97.6°F | Resp 16 | Wt 131.4 lb

## 2017-09-18 DIAGNOSIS — Z5112 Encounter for antineoplastic immunotherapy: Secondary | ICD-10-CM | POA: Insufficient documentation

## 2017-09-18 DIAGNOSIS — Z79899 Other long term (current) drug therapy: Secondary | ICD-10-CM

## 2017-09-18 DIAGNOSIS — T451X5A Adverse effect of antineoplastic and immunosuppressive drugs, initial encounter: Secondary | ICD-10-CM | POA: Insufficient documentation

## 2017-09-18 DIAGNOSIS — D61810 Antineoplastic chemotherapy induced pancytopenia: Secondary | ICD-10-CM | POA: Diagnosis not present

## 2017-09-18 DIAGNOSIS — C9002 Multiple myeloma in relapse: Secondary | ICD-10-CM | POA: Insufficient documentation

## 2017-09-18 LAB — COMPREHENSIVE METABOLIC PANEL
ALT: 14 U/L (ref 0–44)
ANION GAP: 8 (ref 5–15)
AST: 18 U/L (ref 15–41)
Albumin: 3.8 g/dL (ref 3.5–5.0)
Alkaline Phosphatase: 59 U/L (ref 38–126)
BUN: 13 mg/dL (ref 6–20)
CHLORIDE: 106 mmol/L (ref 98–111)
CO2: 27 mmol/L (ref 22–32)
CREATININE: 0.73 mg/dL (ref 0.44–1.00)
Calcium: 8.3 mg/dL — ABNORMAL LOW (ref 8.9–10.3)
Glucose, Bld: 94 mg/dL (ref 70–99)
Potassium: 3.8 mmol/L (ref 3.5–5.1)
SODIUM: 141 mmol/L (ref 135–145)
Total Bilirubin: 0.4 mg/dL (ref 0.3–1.2)
Total Protein: 5.4 g/dL — ABNORMAL LOW (ref 6.5–8.1)

## 2017-09-18 LAB — CBC WITH DIFFERENTIAL/PLATELET
BASOS PCT: 3 %
Basophils Absolute: 0.1 10*3/uL (ref 0.0–0.1)
EOS ABS: 0.9 10*3/uL — AB (ref 0.0–0.5)
Eosinophils Relative: 26 %
HEMATOCRIT: 35.1 % (ref 34.8–46.6)
HEMOGLOBIN: 11.4 g/dL — AB (ref 11.6–15.9)
LYMPHS ABS: 1.1 10*3/uL (ref 0.9–3.3)
Lymphocytes Relative: 30 %
MCH: 33.3 pg (ref 25.1–34.0)
MCHC: 32.5 g/dL (ref 31.5–36.0)
MCV: 102.6 fL — ABNORMAL HIGH (ref 79.5–101.0)
MONOS PCT: 23 %
Monocytes Absolute: 0.8 10*3/uL (ref 0.1–0.9)
NEUTROS ABS: 0.6 10*3/uL — AB (ref 1.5–6.5)
NEUTROS PCT: 18 %
Platelets: 96 10*3/uL — ABNORMAL LOW (ref 145–400)
RBC: 3.42 MIL/uL — AB (ref 3.70–5.45)
RDW: 14.5 % (ref 11.2–14.5)
WBC: 3.5 10*3/uL — AB (ref 3.9–10.3)

## 2017-09-18 MED ORDER — VALACYCLOVIR HCL 1 G PO TABS: 1000.0000 mg | ORAL_TABLET | Freq: Every day | ORAL | 11 refills | Status: DC

## 2017-09-18 MED ORDER — DIPHENHYDRAMINE HCL 25 MG PO CAPS
ORAL_CAPSULE | ORAL | Status: AC
  Filled 2017-09-18: qty 2

## 2017-09-18 MED ORDER — PROCHLORPERAZINE MALEATE 10 MG PO TABS
ORAL_TABLET | ORAL | Status: AC
  Filled 2017-09-18: qty 1

## 2017-09-18 MED ORDER — HEPARIN SOD (PORK) LOCK FLUSH 100 UNIT/ML IV SOLN
500.0000 [IU] | Freq: Once | INTRAVENOUS | Status: AC | PRN
  Administered 2017-09-18: 500 [IU]
  Filled 2017-09-18: qty 5

## 2017-09-18 MED ORDER — SODIUM CHLORIDE 0.9% FLUSH
10.0000 mL | INTRAVENOUS | Status: DC | PRN
  Administered 2017-09-18: 10 mL
  Filled 2017-09-18: qty 10

## 2017-09-18 MED ORDER — SODIUM CHLORIDE 0.9 % IV SOLN
Freq: Once | INTRAVENOUS | Status: AC
  Administered 2017-09-18: 09:00:00 via INTRAVENOUS

## 2017-09-18 MED ORDER — DIPHENHYDRAMINE HCL 25 MG PO CAPS
50.0000 mg | ORAL_CAPSULE | Freq: Once | ORAL | Status: AC
  Administered 2017-09-18: 50 mg via ORAL

## 2017-09-18 MED ORDER — SODIUM CHLORIDE 0.9 % IV SOLN
15.2000 mg/kg | Freq: Once | INTRAVENOUS | Status: AC
  Administered 2017-09-18: 900 mg via INTRAVENOUS
  Filled 2017-09-18: qty 40

## 2017-09-18 MED ORDER — SODIUM CHLORIDE 0.9 % IV SOLN
20.0000 mg | Freq: Once | INTRAVENOUS | Status: AC
  Administered 2017-09-18: 20 mg via INTRAVENOUS
  Filled 2017-09-18: qty 2

## 2017-09-18 MED ORDER — PROCHLORPERAZINE MALEATE 10 MG PO TABS
10.0000 mg | ORAL_TABLET | Freq: Once | ORAL | Status: AC
  Administered 2017-09-18: 10 mg via ORAL

## 2017-09-18 MED ORDER — ACETAMINOPHEN 325 MG PO TABS
ORAL_TABLET | ORAL | Status: AC
  Filled 2017-09-18: qty 2

## 2017-09-18 MED ORDER — ACETAMINOPHEN 325 MG PO TABS
650.0000 mg | ORAL_TABLET | Freq: Once | ORAL | Status: AC
  Administered 2017-09-18: 650 mg via ORAL

## 2017-09-18 NOTE — Assessment & Plan Note (Signed)
She is pancytopenic today She is not symptomatic We discussed neutropenic precaution She can continue aspirin therapy as long as platelet count is >50,000 We will proceed with daratumumab without delay

## 2017-09-18 NOTE — Telephone Encounter (Signed)
Gave patient avs and calendar of upcoming aug through oct appts.

## 2017-09-18 NOTE — Assessment & Plan Note (Signed)
The patient is doing well and remain asymptomatic Her recent myeloma panel is stable She will continue current dose of Pomalyst at 3 mg, to be taken daily for 21 days with 7 days off  I will continue monitoring of myeloma panel carefully on a monthly basis If her repeat myeloma panel is worse, we can consider switching her over to Kyprolis, Cytoxan and dexamethasone She has been to [facility] recently with good visit.  The patient does not need to get enroll in clinical trial yet We will continue Daratumumab monthly as scheduled along with weekly dexamethasone I plan to start initiating dexamethasone taper slowly She will continue calcium with vitamin D and Zometa every 6 months She will continue prophylactic treatment with Valtrex and aspirin.  I discussed switching her from acyclovir to Valtrex

## 2017-09-18 NOTE — Patient Instructions (Signed)
[facility] Discharge Instructions for Patients Receiving Chemotherapy  Today you received the following chemotherapy agents:  Darzalex  To help prevent nausea and vomiting after your treatment, we encourage you to take your nausea medication as prescribed.   If you develop nausea and vomiting that is not controlled by your nausea medication, call the clinic.   BELOW ARE SYMPTOMS THAT SHOULD BE REPORTED IMMEDIATELY:  *FEVER GREATER THAN 100.5 F  *CHILLS WITH OR WITHOUT FEVER  NAUSEA AND VOMITING THAT IS NOT CONTROLLED WITH YOUR NAUSEA MEDICATION  *UNUSUAL SHORTNESS OF BREATH  *UNUSUAL BRUISING OR BLEEDING  TENDERNESS IN MOUTH AND THROAT WITH OR WITHOUT PRESENCE OF ULCERS  *URINARY PROBLEMS  *BOWEL PROBLEMS  UNUSUAL RASH Items with * indicate a potential emergency and should be followed up as soon as possible.  Feel free to call the clinic should you have any questions or concerns. The clinic phone number is [phone].  Please show the [place] at check-in to the Emergency Department and triage nurse.    Zoledronic Acid injection (Hypercalcemia, Oncology) What is this medicine? ZOLEDRONIC ACID (ZOE le dron ik AS id) lowers the amount of calcium loss from bone. It is used to treat too much calcium in your blood from cancer. It is also used to prevent complications of cancer that has spread to the bone. This medicine may be used for other purposes; ask your health care provider or pharmacist if you have questions. COMMON BRAND NAME(S): Zometa What should I tell my health care provider before I take this medicine? They need to know if you have any of these conditions: -aspirin-sensitive asthma -cancer, especially if you are receiving medicines used to treat cancer -dental disease or wear dentures -infection -kidney disease -receiving corticosteroids like dexamethasone or prednisone -an unusual or allergic reaction to zoledronic acid, other  medicines, foods, dyes, or preservatives -pregnant or trying to get pregnant -breast-feeding How should I use this medicine? This medicine is for infusion into a vein. It is given by a health care professional in a hospital or clinic setting. Talk to your pediatrician regarding the use of this medicine in children. Special care may be needed. Overdosage: If you think you have taken too much of this medicine contact a poison control center or emergency room at once. NOTE: This medicine is only for you. Do not share this medicine with others. What if I miss a dose? It is important not to miss your dose. Call your doctor or health care professional if you are unable to keep an appointment. What may interact with this medicine? -certain antibiotics given by injection -NSAIDs, medicines for pain and inflammation, like ibuprofen or naproxen -some diuretics like bumetanide, furosemide -teriparatide -thalidomide This list may not describe all possible interactions. Give your health care provider a list of all the medicines, herbs, non-prescription drugs, or dietary supplements you use. Also tell them if you smoke, drink alcohol, or use illegal drugs. Some items may interact with your medicine. What should I watch for while using this medicine? Visit your doctor or health care professional for regular checkups. It may be some time before you see the benefit from this medicine. Do not stop taking your medicine unless your doctor tells you to. Your doctor may order blood tests or other tests to see how you are doing. Women should inform their doctor if they wish to become pregnant or think they might be pregnant. There is a potential for serious side effects to an unborn  child. Talk to your health care professional or pharmacist for more information. You should make sure that you get enough calcium and vitamin D while you are taking this medicine. Discuss the foods you eat and the vitamins you take with  your health care professional. Some people who take this medicine have severe bone, joint, and/or muscle pain. This medicine may also increase your risk for jaw problems or a broken thigh bone. Tell your doctor right away if you have severe pain in your jaw, bones, joints, or muscles. Tell your doctor if you have any pain that does not go away or that gets worse. Tell your dentist and dental surgeon that you are taking this medicine. You should not have major dental surgery while on this medicine. See your dentist to have a dental exam and fix any dental problems before starting this medicine. Take good care of your teeth while on this medicine. Make sure you see your dentist for regular follow-up appointments. What side effects may I notice from receiving this medicine? Side effects that you should report to your doctor or health care professional as soon as possible: -allergic reactions like skin rash, itching or hives, swelling of the face, lips, or tongue -anxiety, confusion, or depression -breathing problems -changes in vision -eye pain -feeling faint or lightheaded, falls -jaw pain, especially after dental work -mouth sores -muscle cramps, stiffness, or weakness -redness, blistering, peeling or loosening of the skin, including inside the mouth -trouble passing urine or change in the amount of urine Side effects that usually do not require medical attention (report to your doctor or health care professional if they continue or are bothersome): -bone, joint, or muscle pain -constipation -diarrhea -fever -hair loss -irritation at site where injected -loss of appetite -nausea, vomiting -stomach upset -trouble sleeping -trouble swallowing -weak or tired This list may not describe all possible side effects. Call your doctor for medical advice about side effects. You may report side effects to FDA at 1-800-FDA-1088. Where should I keep my medicine? This drug is given in a hospital or  clinic and will not be stored at home. NOTE: This sheet is a summary. It may not cover all possible information. If you have questions about this medicine, talk to your doctor, pharmacist, or health care provider.  2018 Elsevier/Gold Standard (2013-07-23 14:19:39)

## 2017-09-18 NOTE — Progress Notes (Signed)
[facility] OFFICE PROGRESS NOTE  Patient Care Team: [name], MD as PCP - General [name], MD as Consulting Physician (Oncology) [name], MD as Consulting Physician (Neurosurgery) [name], MD as Consulting Physician (Hematology and Oncology) [name], [name], MD as Referring Physician (Hematology and Oncology)  ASSESSMENT & PLAN:  Multiple myeloma in relapse [facility]) The patient is doing well and remain asymptomatic Her recent myeloma panel is stable She will continue current dose of Pomalyst at 3 mg, to be taken daily for 21 days with 7 days off  I will continue monitoring of myeloma panel carefully on a monthly basis If her repeat myeloma panel is worse, we can consider switching her over to Kyprolis, Cytoxan and dexamethasone She has been to [facility] recently with good visit.  The patient does not need to get enroll in clinical trial yet We will continue Daratumumab monthly as scheduled along with weekly dexamethasone I plan to start initiating dexamethasone taper slowly She will continue calcium with vitamin D and Zometa every 6 months She will continue prophylactic treatment with Valtrex and aspirin.  I discussed switching her from acyclovir to Valtrex  Pancytopenia due to antineoplastic chemotherapy [facility]) She is pancytopenic today She is not symptomatic We discussed neutropenic precaution She can continue aspirin therapy as long as platelet count is >50,000 We will proceed with daratumumab without delay   No orders of the defined types were placed in this encounter.   INTERVAL HISTORY: Please see below for problem oriented charting. I saw her in the infusion room with her husband. I have review outside records related to recent second opinion visit at [facility] Since last time I saw her, she has been doing well She denies recent infection She is wondering whether it makes sense to switch from acyclovir to Valtrex  to prevent shingles/herpes virus Her bone pain is stable The patient denies any recent signs or symptoms of bleeding such as spontaneous epistaxis, hematuria or hematochezia.  SUMMARY OF ONCOLOGIC HISTORY:   Multiple myeloma in relapse ([place])   07/21/2007 Bone Marrow Biopsy    Case #: [id number] Bone marrow showed myeloma      07/21/2007 Miscellaneous    She was diagnosed in May 2009. Had several cycles of RVD      10/14/2007 Bone Marrow Biopsy    Case #: [id number] repeat bone marrow biopsy showed good response to Rx      12/31/2007 Bone Marrow Transplant    She had autologous BMT at [facility]      07/19/2009 Bone Marrow Biopsy    Case #: [id number] Bone marrow biopsy showed only 1% plasma cells      10/10/2010 Bone Marrow Biopsy    [id number] Bone marrow biopsy showed 2% plasma cells      09/25/2011 Bone Marrow Biopsy    Accession #: [id number] Bone marrow only showed 4% plasma cells with ligh chain excess      10/14/2011 - 06/13/2013 Chemotherapy    She received Revlimid only, discontinued due to pancytopenia      06/18/2015 - 09/13/2015 Chemotherapy    She resumed taking Revlimid and dexamethasone. Treatment is stopped due to minimum response and pancytopenia      10/19/2015 -  Chemotherapy    The patient had Velcade for chemotherapy treatment along with weekly Daratumumab. Last dose of Velcade on 02/29/16, stopped due to progression. Pomalidomide added on 03/14/16      04/11/2016 Adverse Reaction  Delay resumption of Pomalyst cycle 2 until 04/16/16 due to neutropenia      09/16/2016 PET scan    1. No findings of active bony lymphoma. Prior activity in the sternum and thoracic spine has resolved. 2. Focal hypermetabolic activity along the sigmoid colon with some local inflammatory stranding in the adjacent mesentery, maximum SUV 11.0. This is probably from mild acute diverticulitis. There is no hypermetabolic activity in this vicinity 4 months ago to suggest that this is from  colon cancer. Correlate with any symptoms. 3. Acute right maxillary sinusitis. 4. Thoracolumbar compression fractures, vertebral augmentation at the L1 level.      09/17/2016 Procedure    CT guided bone marrow biopsy of right posterior iliac bone with both aspirate and core samples obtained.      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 - TRILINEAGE HEMATOPOIESIS. - SEE COMMENT. PERIPHERAL BLOOD: - LYMPHOPENIA. - THROMBOCYTOPENIA. Diagnosis Note The bone marrow is slightly hypocellular for age with trilineage hematopoiesis and non-specific myeloid changes likely related to previous therapy. Plasma cells are increased in number representing 8% of all cells in the aspirate, but with lack of large aggregates or sheets in the clot and biopsy sections. Immunohistochemical stains highlight the slightly increased plasma cell component in the marrow which shows kappa light chain restriction consistent with residual/recurrent plasma cell neoplasm. Correlation with cytogenetic and FISH studies recommended      09/17/2016 Pathology Results    Normal bone marrow cytogenetics and FISH       REVIEW OF SYSTEMS:   Constitutional: Denies fevers, chills or abnormal weight loss Eyes: Denies blurriness of vision Ears, nose, mouth, throat, and face: Denies mucositis or sore throat Respiratory: Denies cough, dyspnea or wheezes Cardiovascular: Denies palpitation, chest discomfort or lower extremity swelling Gastrointestinal:  Denies nausea, heartburn or change in bowel habits Skin: Denies abnormal skin rashes Lymphatics: Denies new lymphadenopathy or easy bruising Neurological:Denies numbness, tingling or new weaknesses Behavioral/Psych: Mood is stable, no new changes  All other systems were reviewed with the patient and are negative.  I have reviewed the past medical history, past  surgical history, social history and family history with the patient and they are unchanged from previous note.  ALLERGIES:  is allergic to hydromorphone.  MEDICATIONS:  Current Outpatient Medications  Medication Sig Dispense Refill  . aspirin 81 MG tablet Take 81 mg by mouth.    . cholecalciferol (VITAMIN D) 1000 UNITS tablet Take 1,000 Units by mouth daily.    Marland Kitchen dexamethasone (DECADRON) 4 MG tablet TAKE 5 TABLETS ONCE WEEKLY WITH BREAKFAST EVERY TUESDAY. 20 tablet 0  . Multiple Vitamins-Minerals (ONE-A-DAY 50 PLUS PO) Take by mouth daily.    Marland Kitchen oxycodone (OXY-IR) 5 MG capsule Take 5 mg by mouth every 4 (four) hours as needed. pain    . Oxycodone HCl (OXYCONTIN) 60 MG TB12 Take 1 tablet by mouth 2 (two) times daily.    . pantoprazole (PROTONIX) 40 MG tablet TAKE (1) TABLET DAILY AS NEEDED. 30 tablet 0  . pomalidomide (POMALYST) 3 MG capsule TAKE 1 CAPSULE BY MOUTH ONCE DAILY ON DAYS 1-21 EVERY 28 DAYS 21 capsule 11  . valACYclovir (VALTREX) 1000 MG tablet Take 1 tablet (1,000 mg total) by mouth daily. 90 tablet 11   No current facility-administered medications for this visit.    Facility-Administered Medications Ordered in Other Visits  Medication Dose Route Frequency Provider Last Rate  Last Dose  . sodium chloride flush (NS) 0.9 % injection 10 mL  10 mL Intracatheter PRN [name], MD   10 mL at 09/18/17 1244    PHYSICAL EXAMINATION: ECOG PERFORMANCE STATUS: 1 - Symptomatic but completely ambulatory  GENERAL:alert, no distress and comfortable SKIN: skin color, texture, turgor are normal, no rashes or significant lesions EYES: normal, Conjunctiva are pink and non-injected, sclera clear OROPHARYNX:no exudate, no erythema and lips, buccal mucosa, and tongue normal  NECK: supple, thyroid normal size, non-tender, without nodularity LYMPH:  no palpable lymphadenopathy in the cervical, axillary or inguinal LUNGS: clear to auscultation and percussion with normal breathing effort HEART:  regular rate & rhythm and no murmurs and no lower extremity edema ABDOMEN:abdomen soft, non-tender and normal bowel sounds Musculoskeletal:no cyanosis of digits and no clubbing  NEURO: alert & oriented x 3 with fluent speech, no focal motor/sensory deficits  LABORATORY DATA:  I have reviewed the data as listed    Component Value Date/Time   NA 141 09/18/2017 0830   NA 140 03/11/2017 1150   K 3.8 09/18/2017 0830   K 3.9 03/11/2017 1150   CL 106 09/18/2017 0830   CL 107 08/30/2012 1055   CO2 27 09/18/2017 0830   CO2 29 03/11/2017 1150   GLUCOSE 94 09/18/2017 0830   GLUCOSE 93 03/11/2017 1150   GLUCOSE 102 (H) 08/30/2012 1055   BUN 13 09/18/2017 0830   BUN 12.5 03/11/2017 1150   CREATININE 0.73 09/18/2017 0830   CREATININE 0.8 03/11/2017 1150   CALCIUM 8.3 (L) 09/18/2017 0830   CALCIUM 8.7 03/11/2017 1150   PROT 5.4 (L) 09/18/2017 0830   PROT 6.0 (L) 03/11/2017 1150   ALBUMIN 3.8 09/18/2017 0830   ALBUMIN 3.8 03/11/2017 1150   AST 18 09/18/2017 0830   AST 18 03/11/2017 1150   ALT 14 09/18/2017 0830   ALT 14 03/11/2017 1150   ALKPHOS 59 09/18/2017 0830   ALKPHOS 45 03/11/2017 1150   BILITOT 0.4 09/18/2017 0830   BILITOT 0.37 03/11/2017 1150   GFRNONAA >60 09/18/2017 0830   GFRAA >60 09/18/2017 0830    No results found for: SPEP, UPEP  Lab Results  Component Value Date   WBC 3.5 (L) 09/18/2017   NEUTROABS 0.6 (L) 09/18/2017   HGB 11.4 (L) 09/18/2017   HCT 35.1 09/18/2017   MCV 102.6 (H) 09/18/2017   PLT 96 (L) 09/18/2017      Chemistry      Component Value Date/Time   NA 141 09/18/2017 0830   NA 140 03/11/2017 1150   K 3.8 09/18/2017 0830   K 3.9 03/11/2017 1150   CL 106 09/18/2017 0830   CL 107 08/30/2012 1055   CO2 27 09/18/2017 0830   CO2 29 03/11/2017 1150   BUN 13 09/18/2017 0830   BUN 12.5 03/11/2017 1150   CREATININE 0.73 09/18/2017 0830   CREATININE 0.8 03/11/2017 1150      Component Value Date/Time   CALCIUM 8.3 (L) 09/18/2017 0830   CALCIUM  8.7 03/11/2017 1150   ALKPHOS 59 09/18/2017 0830   ALKPHOS 45 03/11/2017 1150   AST 18 09/18/2017 0830   AST 18 03/11/2017 1150   ALT 14 09/18/2017 0830   ALT 14 03/11/2017 1150   BILITOT 0.4 09/18/2017 0830   BILITOT 0.37 03/11/2017 1150     All questions were answered. The patient knows to call the clinic with any problems, questions or concerns. No barriers to learning was detected.  I spent 15 minutes counseling the  patient face to face. The total time spent in the appointment was 20 minutes and more than 50% was on counseling and review of test results  [name], MD 09/18/2017 1:43 PM

## 2017-09-21 LAB — MULTIPLE MYELOMA PANEL, SERUM
ALBUMIN/GLOB SERPL: 1.9 — AB (ref 0.7–1.7)
ALPHA2 GLOB SERPL ELPH-MCNC: 0.6 g/dL (ref 0.4–1.0)
Albumin SerPl Elph-Mcnc: 3.3 g/dL (ref 2.9–4.4)
Alpha 1: 0.2 g/dL (ref 0.0–0.4)
B-GLOBULIN SERPL ELPH-MCNC: 0.7 g/dL (ref 0.7–1.3)
GAMMA GLOB SERPL ELPH-MCNC: 0.3 g/dL — AB (ref 0.4–1.8)
GLOBULIN, TOTAL: 1.8 g/dL — AB (ref 2.2–3.9)
IGG (IMMUNOGLOBIN G), SERUM: 271 mg/dL — AB (ref 700–1600)
IGM (IMMUNOGLOBULIN M), SRM: 8 mg/dL — AB (ref 26–217)
IgA: 8 mg/dL — ABNORMAL LOW (ref 87–352)
M PROTEIN SERPL ELPH-MCNC: 0.1 g/dL — AB
Total Protein ELP: 5.1 g/dL — ABNORMAL LOW (ref 6.0–8.5)

## 2017-09-21 LAB — KAPPA/LAMBDA LIGHT CHAINS
Kappa free light chain: 38.2 mg/L — ABNORMAL HIGH (ref 3.3–19.4)
Kappa, lambda light chain ratio: 13.64 — ABNORMAL HIGH (ref 0.26–1.65)
Lambda free light chains: 2.8 mg/L — ABNORMAL LOW (ref 5.7–26.3)

## 2017-10-09 ENCOUNTER — Other Ambulatory Visit: Payer: Self-pay | Admitting: *Deleted

## 2017-10-09 MED ORDER — POMALIDOMIDE 3 MG PO CAPS: ORAL_CAPSULE | ORAL | 11 refills | Status: DC

## 2017-10-16 ENCOUNTER — Inpatient Hospital Stay (HOSPITAL_BASED_OUTPATIENT_CLINIC_OR_DEPARTMENT_OTHER): Payer: 59 | Admitting: Hematology and Oncology

## 2017-10-16 ENCOUNTER — Encounter: Payer: Self-pay | Admitting: Hematology and Oncology

## 2017-10-16 ENCOUNTER — Inpatient Hospital Stay: Payer: 59 | Attending: Hematology and Oncology

## 2017-10-16 VITALS — BP 110/74 | HR 64 | Temp 97.8°F | Resp 16

## 2017-10-16 DIAGNOSIS — D61810 Antineoplastic chemotherapy induced pancytopenia: Secondary | ICD-10-CM | POA: Diagnosis not present

## 2017-10-16 DIAGNOSIS — T451X5A Adverse effect of antineoplastic and immunosuppressive drugs, initial encounter: Secondary | ICD-10-CM | POA: Diagnosis not present

## 2017-10-16 DIAGNOSIS — Z79899 Other long term (current) drug therapy: Secondary | ICD-10-CM | POA: Insufficient documentation

## 2017-10-16 DIAGNOSIS — C9002 Multiple myeloma in relapse: Secondary | ICD-10-CM

## 2017-10-16 DIAGNOSIS — Z5112 Encounter for antineoplastic immunotherapy: Secondary | ICD-10-CM | POA: Insufficient documentation

## 2017-10-16 LAB — CBC WITH DIFFERENTIAL/PLATELET
Basophils Absolute: 0.1 10*3/uL (ref 0.0–0.1)
Basophils Relative: 5 %
EOS PCT: 28 %
Eosinophils Absolute: 0.8 10*3/uL — ABNORMAL HIGH (ref 0.0–0.5)
HCT: 35.6 % (ref 34.8–46.6)
Hemoglobin: 11.7 g/dL (ref 11.6–15.9)
LYMPHS ABS: 0.9 10*3/uL (ref 0.9–3.3)
LYMPHS PCT: 31 %
MCH: 33.5 pg (ref 25.1–34.0)
MCHC: 32.8 g/dL (ref 31.5–36.0)
MCV: 102 fL — AB (ref 79.5–101.0)
Monocytes Absolute: 0.5 10*3/uL (ref 0.1–0.9)
Monocytes Relative: 19 %
Neutro Abs: 0.5 10*3/uL — ABNORMAL LOW (ref 1.5–6.5)
Neutrophils Relative %: 17 %
Platelets: 88 10*3/uL — ABNORMAL LOW (ref 145–400)
RBC: 3.49 MIL/uL — AB (ref 3.70–5.45)
RDW: 15.3 % — ABNORMAL HIGH (ref 11.2–14.5)
WBC: 2.8 10*3/uL — ABNORMAL LOW (ref 3.9–10.3)

## 2017-10-16 LAB — COMPREHENSIVE METABOLIC PANEL
ALK PHOS: 43 U/L (ref 38–126)
ALT: 13 U/L (ref 0–44)
AST: 17 U/L (ref 15–41)
Albumin: 3.8 g/dL (ref 3.5–5.0)
Anion gap: 10 (ref 5–15)
BILIRUBIN TOTAL: 0.7 mg/dL (ref 0.3–1.2)
BUN: 11 mg/dL (ref 6–20)
CALCIUM: 8.1 mg/dL — AB (ref 8.9–10.3)
CHLORIDE: 105 mmol/L (ref 98–111)
CO2: 24 mmol/L (ref 22–32)
CREATININE: 0.72 mg/dL (ref 0.44–1.00)
Glucose, Bld: 87 mg/dL (ref 70–99)
Potassium: 3.5 mmol/L (ref 3.5–5.1)
Sodium: 139 mmol/L (ref 135–145)
TOTAL PROTEIN: 5.6 g/dL — AB (ref 6.5–8.1)

## 2017-10-16 MED ORDER — ACETAMINOPHEN 325 MG PO TABS
650.0000 mg | ORAL_TABLET | Freq: Once | ORAL | Status: AC
  Administered 2017-10-16: 650 mg via ORAL

## 2017-10-16 MED ORDER — DEXAMETHASONE SODIUM PHOSPHATE 100 MG/10ML IJ SOLN
20.0000 mg | Freq: Once | INTRAMUSCULAR | Status: AC
  Administered 2017-10-16: 20 mg via INTRAVENOUS
  Filled 2017-10-16: qty 2

## 2017-10-16 MED ORDER — DIPHENHYDRAMINE HCL 25 MG PO CAPS
50.0000 mg | ORAL_CAPSULE | Freq: Once | ORAL | Status: AC
  Administered 2017-10-16: 50 mg via ORAL

## 2017-10-16 MED ORDER — SODIUM CHLORIDE 0.9 % IV SOLN
Freq: Once | INTRAVENOUS | Status: AC
  Administered 2017-10-16: 10:00:00 via INTRAVENOUS
  Filled 2017-10-16: qty 250

## 2017-10-16 MED ORDER — SODIUM CHLORIDE 0.9% FLUSH
10.0000 mL | INTRAVENOUS | Status: DC | PRN
  Administered 2017-10-16: 10 mL
  Filled 2017-10-16: qty 10

## 2017-10-16 MED ORDER — ACETAMINOPHEN 325 MG PO TABS
ORAL_TABLET | ORAL | Status: AC
  Filled 2017-10-16: qty 2

## 2017-10-16 MED ORDER — SODIUM CHLORIDE 0.9 % IV SOLN
15.2000 mg/kg | Freq: Once | INTRAVENOUS | Status: AC
  Administered 2017-10-16: 900 mg via INTRAVENOUS
  Filled 2017-10-16: qty 40

## 2017-10-16 MED ORDER — HEPARIN SOD (PORK) LOCK FLUSH 100 UNIT/ML IV SOLN
500.0000 [IU] | Freq: Once | INTRAVENOUS | Status: AC | PRN
  Administered 2017-10-16: 500 [IU]
  Filled 2017-10-16: qty 5

## 2017-10-16 MED ORDER — DIPHENHYDRAMINE HCL 25 MG PO CAPS
ORAL_CAPSULE | ORAL | Status: AC
  Filled 2017-10-16: qty 2

## 2017-10-16 MED ORDER — PROCHLORPERAZINE MALEATE 10 MG PO TABS
ORAL_TABLET | ORAL | Status: AC
  Filled 2017-10-16: qty 1

## 2017-10-16 MED ORDER — PROCHLORPERAZINE MALEATE 10 MG PO TABS
10.0000 mg | ORAL_TABLET | Freq: Once | ORAL | Status: AC
  Administered 2017-10-16: 10 mg via ORAL

## 2017-10-16 NOTE — Assessment & Plan Note (Signed)
The patient is doing well and remain asymptomatic Her recent myeloma panel is stable She will continue current dose of Pomalyst at 3 mg, to be taken daily for 21 days with 7 days off  I will continue monitoring of myeloma panel carefully on a monthly basis If her repeat myeloma panel is worse, we can consider switching her over to Kyprolis, Cytoxan and dexamethasone We will continue Daratumumab monthly She has recently completed dexamethasone taper.  We discussed the risk and benefits of resumption of dexamethasone if she started to have excessive fatigue She will continue calcium with vitamin D and Zometa every 6 months She will continue prophylactic treatment with Valtrex and aspirin.  I discussed switching her from acyclovir to Valtrex Due to mild pancytopenia, I recommend 10 days of break after the cycle is completed to allow additional time of recovery before resumption of Pomalyst

## 2017-10-16 NOTE — Patient Instructions (Signed)
[facility] Discharge Instructions for Patients Receiving Chemotherapy  Today you received the following chemotherapy agents: Darzalex  To help prevent nausea and vomiting after your treatment, we encourage you to take your nausea medication as directed.    If you develop nausea and vomiting that is not controlled by your nausea medication, call the clinic.   BELOW ARE SYMPTOMS THAT SHOULD BE REPORTED IMMEDIATELY:  *FEVER GREATER THAN 100.5 F  *CHILLS WITH OR WITHOUT FEVER  NAUSEA AND VOMITING THAT IS NOT CONTROLLED WITH YOUR NAUSEA MEDICATION  *UNUSUAL SHORTNESS OF BREATH  *UNUSUAL BRUISING OR BLEEDING  TENDERNESS IN MOUTH AND THROAT WITH OR WITHOUT PRESENCE OF ULCERS  *URINARY PROBLEMS  *BOWEL PROBLEMS  UNUSUAL RASH Items with * indicate a potential emergency and should be followed up as soon as possible.  Feel free to call the clinic should you have any questions or concerns. The clinic phone number is [phone].  Please show the CHEMO ALERT CARD at check-in to the Emergency Department and triage nurse.   

## 2017-10-16 NOTE — Progress Notes (Signed)
[place] OFFICE PROGRESS NOTE  Patient Care Team: [name], MD as PCP - General [name], MD as Consulting Physician (Oncology) [name], MD as Consulting Physician (Neurosurgery) [name], MD as Consulting Physician (Hematology and Oncology) [name], [name], MD as Referring Physician (Hematology and Oncology)  ASSESSMENT & PLAN:  Multiple myeloma in relapse [facility]) The patient is doing well and remain asymptomatic Her recent myeloma panel is stable She will continue current dose of Pomalyst at 3 mg, to be taken daily for 21 days with 7 days off  I will continue monitoring of myeloma panel carefully on a monthly basis If her repeat myeloma panel is worse, we can consider switching her over to Kyprolis, Cytoxan and dexamethasone We will continue Daratumumab monthly She has recently completed dexamethasone taper.  We discussed the risk and benefits of resumption of dexamethasone if she started to have excessive fatigue She will continue calcium with vitamin D and Zometa every 6 months She will continue prophylactic treatment with Valtrex and aspirin.  I discussed switching her from acyclovir to Valtrex Due to mild pancytopenia, I recommend 10 days of break after the cycle is completed to allow additional time of recovery before resumption of Pomalyst  Pancytopenia due to antineoplastic chemotherapy [facility]) Her cycle of Pomalyst treatment will complete by tomorrow Insert of 7 days, I recommend 10 days visit to allow additional time of recovery due to significant pancytopenia We discussed neutropenic precaution We will proceed with daratumumab today There is no contraindication to remain on antiplatelet agents or anticoagulants as long as the platelet is greater than 50,000.     No orders of the defined types were placed in this encounter.   INTERVAL HISTORY: Please see below for problem oriented charting. She is seen in the infusion  room She feels well Denies recent infection, fever or chills She noted some mild easy bruising The patient denies any recent signs or symptoms of bleeding such as spontaneous epistaxis, hematuria or hematochezia. She will take her last pill of Pomalyst tomorrow for current cycle of therapy She has recently completed dexamethasone taper and felt mildly fatigued No worsening bone pain  SUMMARY OF ONCOLOGIC HISTORY:   Multiple myeloma in relapse ([place])   07/21/2007 Bone Marrow Biopsy    Case #: [id number] Bone marrow showed myeloma    07/21/2007 Miscellaneous    She was diagnosed in May 2009. Had several cycles of RVD    10/14/2007 Bone Marrow Biopsy    Case #: [id number] repeat bone marrow biopsy showed good response to Rx    12/31/2007 Bone Marrow Transplant    She had autologous BMT at [facility]    07/19/2009 Bone Marrow Biopsy    Case #: [id number] Bone marrow biopsy showed only 1% plasma cells    10/10/2010 Bone Marrow Biopsy    [id number] Bone marrow biopsy showed 2% plasma cells    09/25/2011 Bone Marrow Biopsy    Accession #: [id number] Bone marrow only showed 4% plasma cells with ligh chain excess    10/14/2011 - 06/13/2013 Chemotherapy    She received Revlimid only, discontinued due to pancytopenia    06/18/2015 - 09/13/2015 Chemotherapy    She resumed taking Revlimid and dexamethasone. Treatment is stopped due to minimum response and pancytopenia    10/19/2015 -  Chemotherapy    The patient had Velcade for chemotherapy treatment along with weekly Daratumumab. Last dose of Velcade on 02/29/16, stopped due to progression. Pomalidomide added on 03/14/16  04/11/2016 Adverse Reaction    Delay resumption of Pomalyst cycle 2 until 04/16/16 due to neutropenia    09/16/2016 PET scan    1. No findings of active bony lymphoma. Prior activity in the sternum and thoracic spine has resolved. 2. Focal hypermetabolic activity along the sigmoid colon with some local inflammatory stranding in the  adjacent mesentery, maximum SUV 11.0. This is probably from mild acute diverticulitis. There is no hypermetabolic activity in this vicinity 4 months ago to suggest that this is from colon cancer. Correlate with any symptoms. 3. Acute right maxillary sinusitis. 4. Thoracolumbar compression fractures, vertebral augmentation at the L1 level.    09/17/2016 Procedure    CT guided bone marrow biopsy of right posterior iliac bone with both aspirate and core samples obtained.    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 - TRILINEAGE HEMATOPOIESIS. - SEE COMMENT. PERIPHERAL BLOOD: - LYMPHOPENIA. - THROMBOCYTOPENIA. Diagnosis Note The bone marrow is slightly hypocellular for age with trilineage hematopoiesis and non-specific myeloid changes likely related to previous therapy. Plasma cells are increased in number representing 8% of all cells in the aspirate, but with lack of large aggregates or sheets in the clot and biopsy sections. Immunohistochemical stains highlight the slightly increased plasma cell component in the marrow which shows kappa light chain restriction consistent with residual/recurrent plasma cell neoplasm. Correlation with cytogenetic and FISH studies recommended    09/17/2016 Pathology Results    Normal bone marrow cytogenetics and FISH     REVIEW OF SYSTEMS:   Constitutional: Denies fevers, chills or abnormal weight loss Eyes: Denies blurriness of vision Ears, nose, mouth, throat, and face: Denies mucositis or sore throat Respiratory: Denies cough, dyspnea or wheezes Cardiovascular: Denies palpitation, chest discomfort or lower extremity swelling Gastrointestinal:  Denies nausea, heartburn or change in bowel habits Skin: Denies abnormal skin rashes Lymphatics: Denies new lymphadenopathy  Neurological:Denies numbness, tingling or new weaknesses Behavioral/Psych:  Mood is stable, no new changes  All other systems were reviewed with the patient and are negative.  I have reviewed the past medical history, past surgical history, social history and family history with the patient and they are unchanged from previous note.  ALLERGIES:  is allergic to hydromorphone.  MEDICATIONS:  Current Outpatient Medications  Medication Sig Dispense Refill  . aspirin 81 MG tablet Take 81 mg by mouth.    . cholecalciferol (VITAMIN D) 1000 UNITS tablet Take 1,000 Units by mouth daily.    Marland Kitchen dexamethasone (DECADRON) 4 MG tablet TAKE 5 TABLETS ONCE WEEKLY WITH BREAKFAST EVERY TUESDAY. 20 tablet 0  . Multiple Vitamins-Minerals (ONE-A-DAY 50 PLUS PO) Take by mouth daily.    Marland Kitchen oxycodone (OXY-IR) 5 MG capsule Take 5 mg by mouth every 4 (four) hours as needed. pain    . Oxycodone HCl (OXYCONTIN) 60 MG TB12 Take 1 tablet by mouth 2 (two) times daily.    . pantoprazole (PROTONIX) 40 MG tablet TAKE (1) TABLET DAILY AS NEEDED. 30 tablet 0  . pomalidomide (POMALYST) 3 MG capsule TAKE 1 CAPSULE BY MOUTH ONCE DAILY ON DAYS 1-21 EVERY 28 DAYS 21 capsule 11  . valACYclovir (VALTREX) 1000 MG tablet Take 1 tablet (1,000 mg total) by mouth daily. 90 tablet 11   No current facility-administered medications for this visit.    Facility-Administered Medications Ordered in Other Visits  Medication Dose Route Frequency Provider Last Rate Last Dose  . heparin lock  flush 100 unit/mL  500 Units Intracatheter Once PRN Alvy Bimler, Azarah Dacy, MD      . sodium chloride flush (NS) 0.9 % injection 10 mL  10 mL Intracatheter PRN Alvy Bimler, Heena Woodbury, MD        PHYSICAL EXAMINATION: ECOG PERFORMANCE STATUS: 1 - Symptomatic but completely ambulatory GENERAL:alert, no distress and comfortable SKIN: skin color, texture, turgor are normal, no rashes or significant lesions EYES: normal, Conjunctiva are pink and non-injected, sclera clear OROPHARYNX:no exudate, no erythema and lips, buccal mucosa, and tongue normal  NECK:  supple, thyroid normal size, non-tender, without nodularity LYMPH:  no palpable lymphadenopathy in the cervical, axillary or inguinal LUNGS: clear to auscultation and percussion with normal breathing effort HEART: regular rate & rhythm and no murmurs and no lower extremity edema ABDOMEN:abdomen soft, non-tender and normal bowel sounds Musculoskeletal:no cyanosis of digits and no clubbing  NEURO: alert & oriented x 3 with fluent speech, no focal motor/sensory deficits  LABORATORY DATA:  I have reviewed the data as listed    Component Value Date/Time   NA 139 10/16/2017 0815   NA 140 03/11/2017 1150   K 3.5 10/16/2017 0815   K 3.9 03/11/2017 1150   CL 105 10/16/2017 0815   CL 107 08/30/2012 1055   CO2 24 10/16/2017 0815   CO2 29 03/11/2017 1150   GLUCOSE 87 10/16/2017 0815   GLUCOSE 93 03/11/2017 1150   GLUCOSE 102 (H) 08/30/2012 1055   BUN 11 10/16/2017 0815   BUN 12.5 03/11/2017 1150   CREATININE 0.72 10/16/2017 0815   CREATININE 0.8 03/11/2017 1150   CALCIUM 8.1 (L) 10/16/2017 0815   CALCIUM 8.7 03/11/2017 1150   PROT 5.6 (L) 10/16/2017 0815   PROT 6.0 (L) 03/11/2017 1150   ALBUMIN 3.8 10/16/2017 0815   ALBUMIN 3.8 03/11/2017 1150   AST 17 10/16/2017 0815   AST 18 03/11/2017 1150   ALT 13 10/16/2017 0815   ALT 14 03/11/2017 1150   ALKPHOS 43 10/16/2017 0815   ALKPHOS 45 03/11/2017 1150   BILITOT 0.7 10/16/2017 0815   BILITOT 0.37 03/11/2017 1150   GFRNONAA >60 10/16/2017 0815   GFRAA >60 10/16/2017 0815    No results found for: SPEP, UPEP  Lab Results  Component Value Date   WBC 2.8 (L) 10/16/2017   NEUTROABS 0.5 (L) 10/16/2017   HGB 11.7 10/16/2017   HCT 35.6 10/16/2017   MCV 102.0 (H) 10/16/2017   PLT 88 (L) 10/16/2017      Chemistry      Component Value Date/Time   NA 139 10/16/2017 0815   NA 140 03/11/2017 1150   K 3.5 10/16/2017 0815   K 3.9 03/11/2017 1150   CL 105 10/16/2017 0815   CL 107 08/30/2012 1055   CO2 24 10/16/2017 0815   CO2 29  03/11/2017 1150   BUN 11 10/16/2017 0815   BUN 12.5 03/11/2017 1150   CREATININE 0.72 10/16/2017 0815   CREATININE 0.8 03/11/2017 1150      Component Value Date/Time   CALCIUM 8.1 (L) 10/16/2017 0815   CALCIUM 8.7 03/11/2017 1150   ALKPHOS 43 10/16/2017 0815   ALKPHOS 45 03/11/2017 1150   AST 17 10/16/2017 0815   AST 18 03/11/2017 1150   ALT 13 10/16/2017 0815   ALT 14 03/11/2017 1150   BILITOT 0.7 10/16/2017 0815   BILITOT 0.37 03/11/2017 1150       All questions were answered. The patient knows to call the clinic with any problems, questions or concerns. No barriers  to learning was detected.  I spent 15 minutes counseling the patient face to face. The total time spent in the appointment was 20 minutes and more than 50% was on counseling and review of test results  [name], MD 10/16/2017 1:42 PM

## 2017-10-16 NOTE — Assessment & Plan Note (Signed)
Her cycle of Pomalyst treatment will complete by tomorrow Insert of 7 days, I recommend 10 days visit to allow additional time of recovery due to significant pancytopenia We discussed neutropenic precaution We will proceed with daratumumab today There is no contraindication to remain on antiplatelet agents or anticoagulants as long as the platelet is greater than 50,000.

## 2017-10-19 LAB — MULTIPLE MYELOMA PANEL, SERUM
Albumin SerPl Elph-Mcnc: 3.3 g/dL (ref 2.9–4.4)
Albumin/Glob SerPl: 1.9 — ABNORMAL HIGH (ref 0.7–1.7)
Alpha 1: 0.2 g/dL (ref 0.0–0.4)
Alpha2 Glob SerPl Elph-Mcnc: 0.6 g/dL (ref 0.4–1.0)
B-GLOBULIN SERPL ELPH-MCNC: 0.7 g/dL (ref 0.7–1.3)
GAMMA GLOB SERPL ELPH-MCNC: 0.3 g/dL — AB (ref 0.4–1.8)
GLOBULIN, TOTAL: 1.8 g/dL — AB (ref 2.2–3.9)
IGA: 8 mg/dL — AB (ref 87–352)
IgG (Immunoglobin G), Serum: 256 mg/dL — ABNORMAL LOW (ref 700–1600)
IgM (Immunoglobulin M), Srm: 8 mg/dL — ABNORMAL LOW (ref 26–217)
M PROTEIN SERPL ELPH-MCNC: 0.1 g/dL — AB
Total Protein ELP: 5.1 g/dL — ABNORMAL LOW (ref 6.0–8.5)

## 2017-10-19 LAB — KAPPA/LAMBDA LIGHT CHAINS
KAPPA FREE LGHT CHN: 35.2 mg/L — AB (ref 3.3–19.4)
Kappa, lambda light chain ratio: 13.54 — ABNORMAL HIGH (ref 0.26–1.65)
LAMDA FREE LIGHT CHAINS: 2.6 mg/L — AB (ref 5.7–26.3)

## 2017-10-26 ENCOUNTER — Telehealth: Payer: Self-pay

## 2017-10-26 NOTE — Telephone Encounter (Signed)
Called and gave Jessica Cochran, husband below message. He verbalized understanding.

## 2017-10-26 NOTE — Telephone Encounter (Signed)
-----   Message from [name], MD sent at 10/26/2017 11:11 AM EDT ----- Regarding: myleoma light chains are stable pls call her and let her know light chain studies are stable ----- Message ----- From: Interface, Lab In [facility] Sent: 09/18/2017   8:43 AM EDT To: [name], MD

## 2017-11-06 ENCOUNTER — Other Ambulatory Visit: Payer: Self-pay | Admitting: *Deleted

## 2017-11-06 MED ORDER — POMALIDOMIDE 3 MG PO CAPS: ORAL_CAPSULE | ORAL | 11 refills | Status: DC

## 2017-11-13 ENCOUNTER — Encounter: Payer: Self-pay | Admitting: Hematology and Oncology

## 2017-11-13 ENCOUNTER — Other Ambulatory Visit: Payer: Self-pay | Admitting: Hematology and Oncology

## 2017-11-13 ENCOUNTER — Inpatient Hospital Stay: Payer: 59 | Attending: Hematology and Oncology

## 2017-11-13 ENCOUNTER — Inpatient Hospital Stay (HOSPITAL_BASED_OUTPATIENT_CLINIC_OR_DEPARTMENT_OTHER): Payer: 59 | Admitting: Hematology and Oncology

## 2017-11-13 ENCOUNTER — Inpatient Hospital Stay: Payer: 59

## 2017-11-13 VITALS — BP 121/77 | HR 72 | Temp 98.9°F | Resp 18

## 2017-11-13 DIAGNOSIS — C9002 Multiple myeloma in relapse: Secondary | ICD-10-CM

## 2017-11-13 DIAGNOSIS — Z79899 Other long term (current) drug therapy: Secondary | ICD-10-CM | POA: Insufficient documentation

## 2017-11-13 DIAGNOSIS — Z5112 Encounter for antineoplastic immunotherapy: Secondary | ICD-10-CM | POA: Insufficient documentation

## 2017-11-13 DIAGNOSIS — D61810 Antineoplastic chemotherapy induced pancytopenia: Secondary | ICD-10-CM | POA: Diagnosis not present

## 2017-11-13 DIAGNOSIS — T451X5A Adverse effect of antineoplastic and immunosuppressive drugs, initial encounter: Secondary | ICD-10-CM | POA: Diagnosis not present

## 2017-11-13 LAB — CBC WITH DIFFERENTIAL/PLATELET
Basophils Absolute: 0.2 10*3/uL — ABNORMAL HIGH (ref 0.0–0.1)
Basophils Relative: 4 %
EOS ABS: 0.7 10*3/uL — AB (ref 0.0–0.5)
Eosinophils Relative: 20 %
HCT: 36.2 % (ref 34.8–46.6)
HEMOGLOBIN: 11.8 g/dL (ref 11.6–15.9)
Lymphocytes Relative: 24 %
Lymphs Abs: 0.9 10*3/uL (ref 0.9–3.3)
MCH: 33.3 pg (ref 25.1–34.0)
MCHC: 32.5 g/dL (ref 31.5–36.0)
MCV: 102.4 fL — ABNORMAL HIGH (ref 79.5–101.0)
Monocytes Absolute: 0.8 10*3/uL (ref 0.1–0.9)
Monocytes Relative: 21 %
NEUTROS PCT: 31 %
Neutro Abs: 1.1 10*3/uL — ABNORMAL LOW (ref 1.5–6.5)
PLATELETS: 102 10*3/uL — AB (ref 145–400)
RBC: 3.54 MIL/uL — AB (ref 3.70–5.45)
RDW: 16.4 % — ABNORMAL HIGH (ref 11.2–14.5)
WBC: 3.7 10*3/uL — AB (ref 3.9–10.3)

## 2017-11-13 LAB — COMPREHENSIVE METABOLIC PANEL
ALK PHOS: 52 U/L (ref 38–126)
ALT: 9 U/L (ref 0–44)
ANION GAP: 11 (ref 5–15)
AST: 20 U/L (ref 15–41)
Albumin: 3.9 g/dL (ref 3.5–5.0)
BUN: 15 mg/dL (ref 6–20)
CALCIUM: 9.2 mg/dL (ref 8.9–10.3)
CO2: 25 mmol/L (ref 22–32)
Chloride: 106 mmol/L (ref 98–111)
Creatinine, Ser: 0.72 mg/dL (ref 0.44–1.00)
GFR calc non Af Amer: >60 mL/min (ref >60)
GLUCOSE: 85 mg/dL (ref 70–99)
Potassium: 3.7 mmol/L (ref 3.5–5.1)
SODIUM: 142 mmol/L (ref 135–145)
Total Bilirubin: 0.6 mg/dL (ref 0.3–1.2)
Total Protein: 5.8 g/dL — ABNORMAL LOW (ref 6.5–8.1)

## 2017-11-13 MED ORDER — PROCHLORPERAZINE MALEATE 10 MG PO TABS
ORAL_TABLET | ORAL | Status: AC
  Filled 2017-11-13: qty 1

## 2017-11-13 MED ORDER — ACETAMINOPHEN 325 MG PO TABS
650.0000 mg | ORAL_TABLET | Freq: Once | ORAL | Status: AC
  Administered 2017-11-13: 650 mg via ORAL

## 2017-11-13 MED ORDER — SODIUM CHLORIDE 0.9 % IV SOLN
20.0000 mg | Freq: Once | INTRAVENOUS | Status: AC
  Administered 2017-11-13: 20 mg via INTRAVENOUS
  Filled 2017-11-13: qty 2

## 2017-11-13 MED ORDER — SODIUM CHLORIDE 0.9% FLUSH
10.0000 mL | INTRAVENOUS | Status: DC | PRN
  Administered 2017-11-13: 10 mL
  Filled 2017-11-13: qty 10

## 2017-11-13 MED ORDER — SODIUM CHLORIDE 0.9 % IV SOLN
15.2000 mg/kg | Freq: Once | INTRAVENOUS | Status: AC
  Administered 2017-11-13: 900 mg via INTRAVENOUS
  Filled 2017-11-13: qty 5

## 2017-11-13 MED ORDER — PROCHLORPERAZINE MALEATE 10 MG PO TABS
10.0000 mg | ORAL_TABLET | Freq: Once | ORAL | Status: AC
  Administered 2017-11-13: 10 mg via ORAL

## 2017-11-13 MED ORDER — SODIUM CHLORIDE 0.9 % IV SOLN
Freq: Once | INTRAVENOUS | Status: AC
  Administered 2017-11-13: 10:00:00 via INTRAVENOUS
  Filled 2017-11-13: qty 250

## 2017-11-13 MED ORDER — DIPHENHYDRAMINE HCL 25 MG PO CAPS
50.0000 mg | ORAL_CAPSULE | Freq: Once | ORAL | Status: AC
  Administered 2017-11-13: 50 mg via ORAL

## 2017-11-13 MED ORDER — ACETAMINOPHEN 325 MG PO TABS
ORAL_TABLET | ORAL | Status: AC
  Filled 2017-11-13: qty 2

## 2017-11-13 MED ORDER — DIPHENHYDRAMINE HCL 25 MG PO CAPS
ORAL_CAPSULE | ORAL | Status: AC
  Filled 2017-11-13: qty 2

## 2017-11-13 MED ORDER — HEPARIN SOD (PORK) LOCK FLUSH 100 UNIT/ML IV SOLN
500.0000 [IU] | Freq: Once | INTRAVENOUS | Status: AC | PRN
  Administered 2017-11-13: 500 [IU]
  Filled 2017-11-13: qty 5

## 2017-11-13 NOTE — Patient Instructions (Signed)
[facility] Discharge Instructions for Patients Receiving Chemotherapy  Today you received the following chemotherapy agents: daratumumab (Darzalex).  To help prevent nausea and vomiting after your treatment, we encourage you to take your nausea medication as directed.   If you develop nausea and vomiting that is not controlled by your nausea medication, call the clinic.   BELOW ARE SYMPTOMS THAT SHOULD BE REPORTED IMMEDIATELY:  *FEVER GREATER THAN 100.5 F  *CHILLS WITH OR WITHOUT FEVER  NAUSEA AND VOMITING THAT IS NOT CONTROLLED WITH YOUR NAUSEA MEDICATION  *UNUSUAL SHORTNESS OF BREATH  *UNUSUAL BRUISING OR BLEEDING  TENDERNESS IN MOUTH AND THROAT WITH OR WITHOUT PRESENCE OF ULCERS  *URINARY PROBLEMS  *BOWEL PROBLEMS  UNUSUAL RASH Items with * indicate a potential emergency and should be followed up as soon as possible.  Feel free to call the clinic should you have any questions or concerns. The clinic phone number is [phone].  Please show the CHEMO ALERT CARD at check-in to the Emergency Department and triage nurse.   

## 2017-11-13 NOTE — Progress Notes (Signed)
[place] OFFICE PROGRESS NOTE  Patient Care Team: [name], MD as PCP - General [name], MD as Consulting Physician (Oncology) [name], MD as Consulting Physician (Neurosurgery) [name], MD as Consulting Physician (Hematology and Oncology) [name], [name], MD as Referring Physician (Hematology and Oncology)  ASSESSMENT & PLAN:  Multiple myeloma in relapse [facility] Co[facility]ran [facility]) I reviewed recent myeloma panel with the patient and her husband She responded well to treatment with stabilization of her serum light chain She is asymptomatic from side effects of treatment except for mild pancytopenia We will continue treatment indefinitely She will continue antimicrobial prophylaxis with valacyclovir She will continue calcium with vitamin D and Zometa every 6 months, her next dose will be due in December  Pancytopenia due to antineoplastic chemotherapy [facility]) We discussed neutropenic precaution We will proceed with daratumumab today There is no contraindication to remain on antiplatelet agents or anticoagulants as long as the platelet is greater than 50,000.  She will continue current dose of Pomalyst without dose adjustment     No orders of the defined types were placed in this encounter.   INTERVAL HISTORY: Please see below for problem oriented charting. She returns for treatment today.  She is seen in the infusion room She tolerated chemotherapy well so far Denies recent infection, fever or chills The patient denies any recent signs or symptoms of bleeding such as spontaneous epistaxis, hematuria or hematochezia. She denies recent bone pain.  No recent dental issues.  She has no difficulties getting medication refills.  Denies infusion reactions.  SUMMARY OF ONCOLOGIC HISTORY:   Multiple myeloma in relapse ([place])   07/21/2007 Bone Marrow Biopsy    Case #: [id number] Bone marrow showed myeloma    07/21/2007 Miscellaneous    She was diagnosed in  May 2009. Had several cycles of RVD    10/14/2007 Bone Marrow Biopsy    Case #: [id number] repeat bone marrow biopsy showed good response to Rx    12/31/2007 Bone Marrow Transplant    She had autologous BMT at [facility]    07/19/2009 Bone Marrow Biopsy    Case #: [id number] Bone marrow biopsy showed only 1% plasma cells    10/10/2010 Bone Marrow Biopsy    [id number] Bone marrow biopsy showed 2% plasma cells    09/25/2011 Bone Marrow Biopsy    Accession #: [id number] Bone marrow only showed 4% plasma cells with ligh chain excess    10/14/2011 - 06/13/2013 Chemotherapy    She received Revlimid only, discontinued due to pancytopenia    06/18/2015 - 09/13/2015 Chemotherapy    She resumed taking Revlimid and dexamethasone. Treatment is stopped due to minimum response and pancytopenia    10/19/2015 -  Chemotherapy    The patient had Velcade for chemotherapy treatment along with weekly Daratumumab. Last dose of Velcade on 02/29/16, stopped due to progression. Pomalidomide added on 03/14/16    04/11/2016 Adverse Reaction    Delay resumption of Pomalyst cycle 2 until 04/16/16 due to neutropenia    09/16/2016 PET scan    1. No findings of active bony lymphoma. Prior activity in the sternum and thoracic spine has resolved. 2. Focal hypermetabolic activity along the sigmoid colon with some local inflammatory stranding in the adjacent mesentery, maximum SUV 11.0. This is probably from mild acute diverticulitis. There is no hypermetabolic activity in this vicinity 4 months ago to suggest that this is from colon cancer. Correlate with any symptoms. 3. Acute right maxillary sinusitis. 4. Thoracolumbar compression  fractures, vertebral augmentation at the L1 level.    09/17/2016 Procedure    CT guided bone marrow biopsy of right posterior iliac bone with both aspirate and core samples obtained.    09/17/2016 Bone Marrow Biopsy    Bone Marrow, Aspirate,Biopsy, and Clot, right iliac - SLIGHTLY HYPOCELLULAR BONE  MARROW FOR AGE WITH PLASMA CELL NEOPLASM. - TRILINEAGE HEMATOPOIESIS. - SEE COMMENT. PERIPHERAL BLOOD: - LYMPHOPENIA. - THROMBOCYTOPENIA. Diagnosis Note The bone marrow is slightly hypocellular for age with trilineage hematopoiesis and non-specific myeloid changes likely related to previous therapy. Plasma cells are increased in number representing 8% of all cells in the aspirate, but with lack of large aggregates or sheets in the clot and biopsy sections. Immunohistochemical stains highlight the slightly increased plasma cell component in the marrow which shows kappa light chain restriction consistent with residual/recurrent plasma cell neoplasm. Correlation with cytogenetic and FISH studies recommended    09/17/2016 Pathology Results    Normal bone marrow cytogenetics and FISH     REVIEW OF SYSTEMS:   Constitutional: Denies fevers, chills or abnormal weight loss Eyes: Denies blurriness of vision Ears, nose, mouth, throat, and face: Denies mucositis or sore throat Respiratory: Denies cough, dyspnea or wheezes Cardiovascular: Denies palpitation, chest discomfort or lower extremity swelling Gastrointestinal:  Denies nausea, heartburn or change in bowel habits Skin: Denies abnormal skin rashes Lymphatics: Denies new lymphadenopathy or easy bruising Neurological:Denies numbness, tingling or new weaknesses Behavioral/Psych: Mood is stable, no new changes  All other systems were reviewed with the patient and are negative.  I have reviewed the past medical history, past surgical history, social history and family history with the patient and they are unchanged from previous note.  ALLERGIES:  is allergic to hydromorphone.  MEDICATIONS:  Current Outpatient Medications  Medication Sig Dispense Refill  . aspirin 81 MG tablet Take 81 mg by mouth.    . cholecalciferol (VITAMIN D) 1000 UNITS tablet Take 1,000 Units by mouth daily.    Marland Kitchen dexamethasone (DECADRON) 4 MG tablet TAKE 5 TABLETS ONCE  WEEKLY WITH BREAKFAST EVERY TUESDAY. 20 tablet 0  . Multiple Vitamins-Minerals (ONE-A-DAY 50 PLUS PO) Take by mouth daily.    Marland Kitchen oxycodone (OXY-IR) 5 MG capsule Take 5 mg by mouth every 4 (four) hours as needed. pain    . Oxycodone HCl (OXYCONTIN) 60 MG TB12 Take 1 tablet by mouth 2 (two) times daily.    . pantoprazole (PROTONIX) 40 MG tablet TAKE (1) TABLET DAILY AS NEEDED. 30 tablet 0  . pomalidomide (POMALYST) 3 MG capsule TAKE 1 CAPSULE BY MOUTH ONCE DAILY ON DAYS 1-21 EVERY 28 DAYS 21 capsule 11  . valACYclovir (VALTREX) 1000 MG tablet Take 1 tablet (1,000 mg total) by mouth daily. 90 tablet 11   No current facility-administered medications for this visit.    Facility-Administered Medications Ordered in Other Visits  Medication Dose Route Frequency Provider Last Rate Last Dose  . daratumumab (DARZALEX) 900 mg in sodium chloride 0.9 % 455 mL chemo infusion  15.2 mg/kg (Treatment Plan Recorded) Intravenous Once Alvy Bimler, Cathalina Barcia, MD      . heparin lock flush 100 unit/mL  500 Units Intracatheter Once PRN Alvy Bimler, Yoshio Seliga, MD      . sodium chloride flush (NS) 0.9 % injection 10 mL  10 mL Intracatheter PRN Alvy Bimler, Narcissus Detwiler, MD        PHYSICAL EXAMINATION: ECOG PERFORMANCE STATUS: 1 - Symptomatic but completely ambulatory GENERAL:alert, no distress and comfortable SKIN: skin color, texture, turgor are normal, no rashes or significant  lesions EYES: normal, Conjunctiva are pink and non-injected, sclera clear OROPHARYNX:no exudate, no erythema and lips, buccal mucosa, and tongue normal  NECK: supple, thyroid normal size, non-tender, without nodularity LYMPH:  no palpable lymphadenopathy in the cervical, axillary or inguinal LUNGS: clear to auscultation and percussion with normal breathing effort HEART: regular rate & rhythm and no murmurs and no lower extremity edema ABDOMEN:abdomen soft, non-tender and normal bowel sounds Musculoskeletal:no cyanosis of digits and no clubbing  NEURO: alert & oriented x 3  with fluent speech, no focal motor/sensory deficits  LABORATORY DATA:  I have reviewed the data as listed    Component Value Date/Time   NA 142 11/13/2017 0807   NA 140 03/11/2017 1150   K 3.7 11/13/2017 0807   K 3.9 03/11/2017 1150   CL 106 11/13/2017 0807   CL 107 08/30/2012 1055   CO2 25 11/13/2017 0807   CO2 29 03/11/2017 1150   GLUCOSE 85 11/13/2017 0807   GLUCOSE 93 03/11/2017 1150   GLUCOSE 102 (H) 08/30/2012 1055   BUN 15 11/13/2017 0807   BUN 12.5 03/11/2017 1150   CREATININE 0.72 11/13/2017 0807   CREATININE 0.8 03/11/2017 1150   CALCIUM 9.2 11/13/2017 0807   CALCIUM 8.7 03/11/2017 1150   PROT 5.8 (L) 11/13/2017 0807   PROT 6.0 (L) 03/11/2017 1150   ALBUMIN 3.9 11/13/2017 0807   ALBUMIN 3.8 03/11/2017 1150   AST 20 11/13/2017 0807   AST 18 03/11/2017 1150   ALT 9 11/13/2017 0807   ALT 14 03/11/2017 1150   ALKPHOS 52 11/13/2017 0807   ALKPHOS 45 03/11/2017 1150   BILITOT 0.6 11/13/2017 0807   BILITOT 0.37 03/11/2017 1150   GFRNONAA >60 11/13/2017 0807   GFRAA >60 11/13/2017 0807    No results found for: SPEP, UPEP  Lab Results  Component Value Date   WBC 3.7 (L) 11/13/2017   NEUTROABS 1.1 (L) 11/13/2017   HGB 11.8 11/13/2017   HCT 36.2 11/13/2017   MCV 102.4 (H) 11/13/2017   PLT 102 (L) 11/13/2017      Chemistry      Component Value Date/Time   NA 142 11/13/2017 0807   NA 140 03/11/2017 1150   K 3.7 11/13/2017 0807   K 3.9 03/11/2017 1150   CL 106 11/13/2017 0807   CL 107 08/30/2012 1055   CO2 25 11/13/2017 0807   CO2 29 03/11/2017 1150   BUN 15 11/13/2017 0807   BUN 12.5 03/11/2017 1150   CREATININE 0.72 11/13/2017 0807   CREATININE 0.8 03/11/2017 1150      Component Value Date/Time   CALCIUM 9.2 11/13/2017 0807   CALCIUM 8.7 03/11/2017 1150   ALKPHOS 52 11/13/2017 0807   ALKPHOS 45 03/11/2017 1150   AST 20 11/13/2017 0807   AST 18 03/11/2017 1150   ALT 9 11/13/2017 0807   ALT 14 03/11/2017 1150   BILITOT 0.6 11/13/2017 0807    BILITOT 0.37 03/11/2017 1150      All questions were answered. The patient knows to call the clinic with any problems, questions or concerns. No barriers to learning was detected.  I spent 15 minutes counseling the patient face to face. The total time spent in the appointment was 20 minutes and more than 50% was on counseling and review of test results  [name], MD 11/13/2017 10:36 AM

## 2017-11-13 NOTE — Progress Notes (Signed)
Per Dr. Alvy Bimler, ok to treat with [place] 1.1.

## 2017-11-13 NOTE — Assessment & Plan Note (Signed)
I reviewed recent myeloma panel with the patient and her husband She responded well to treatment with stabilization of her serum light chain She is asymptomatic from side effects of treatment except for mild pancytopenia We will continue treatment indefinitely She will continue antimicrobial prophylaxis with valacyclovir She will continue calcium with vitamin D and Zometa every 6 months, her next dose will be due in December

## 2017-11-13 NOTE — Assessment & Plan Note (Signed)
We discussed neutropenic precaution We will proceed with daratumumab today There is no contraindication to remain on antiplatelet agents or anticoagulants as long as the platelet is greater than 50,000.  She will continue current dose of Pomalyst without dose adjustment

## 2017-11-16 LAB — MULTIPLE MYELOMA PANEL, SERUM
Albumin SerPl Elph-Mcnc: 3.6 g/dL (ref 2.9–4.4)
Albumin/Glob SerPl: 2.1 — ABNORMAL HIGH (ref 0.7–1.7)
Alpha 1: 0.3 g/dL (ref 0.0–0.4)
Alpha2 Glob SerPl Elph-Mcnc: 0.6 g/dL (ref 0.4–1.0)
B-GLOBULIN SERPL ELPH-MCNC: 0.7 g/dL (ref 0.7–1.3)
Gamma Glob SerPl Elph-Mcnc: 0.2 g/dL — ABNORMAL LOW (ref 0.4–1.8)
Globulin, Total: 1.8 g/dL — ABNORMAL LOW (ref 2.2–3.9)
IGM (IMMUNOGLOBULIN M), SRM: 9 mg/dL — AB (ref 26–217)
IgA: 11 mg/dL — ABNORMAL LOW (ref 87–352)
IgG (Immunoglobin G), Serum: 282 mg/dL — ABNORMAL LOW (ref 700–1600)
M PROTEIN SERPL ELPH-MCNC: 0.1 g/dL — AB
TOTAL PROTEIN ELP: 5.4 g/dL — AB (ref 6.0–8.5)

## 2017-11-16 LAB — KAPPA/LAMBDA LIGHT CHAINS
KAPPA, LAMDA LIGHT CHAIN RATIO: 8.06 — AB (ref 0.26–1.65)
Kappa free light chain: 43.5 mg/L — ABNORMAL HIGH (ref 3.3–19.4)
LAMDA FREE LIGHT CHAINS: 5.4 mg/L — AB (ref 5.7–26.3)

## 2017-11-17 ENCOUNTER — Telehealth: Payer: Self-pay | Admitting: *Deleted

## 2017-11-17 NOTE — Telephone Encounter (Signed)
As noted below by Dr. Alvy Bimler, I informed patient of her lab values. She verbalized understanding.

## 2017-11-17 NOTE — Telephone Encounter (Signed)
-----   Message from [name], MD sent at 11/16/2017  4:08 PM EDT ----- Regarding: light chain ratio pls let her know light chain ratio is better even though total FLC kappa is a bit higher. In my eyes these are still stable

## 2017-12-07 ENCOUNTER — Other Ambulatory Visit: Payer: Self-pay

## 2017-12-07 MED ORDER — POMALIDOMIDE 3 MG PO CAPS: ORAL_CAPSULE | ORAL | 11 refills | Status: DC

## 2017-12-11 ENCOUNTER — Inpatient Hospital Stay: Payer: 59

## 2017-12-11 ENCOUNTER — Other Ambulatory Visit: Payer: Self-pay | Admitting: Hematology and Oncology

## 2017-12-11 ENCOUNTER — Encounter: Payer: Self-pay | Admitting: Hematology and Oncology

## 2017-12-11 ENCOUNTER — Inpatient Hospital Stay: Payer: 59 | Attending: Hematology and Oncology

## 2017-12-11 ENCOUNTER — Telehealth: Payer: Self-pay | Admitting: Hematology and Oncology

## 2017-12-11 ENCOUNTER — Inpatient Hospital Stay (HOSPITAL_BASED_OUTPATIENT_CLINIC_OR_DEPARTMENT_OTHER): Payer: 59 | Admitting: Hematology and Oncology

## 2017-12-11 VITALS — BP 131/83 | HR 79 | Temp 98.2°F | Resp 18

## 2017-12-11 DIAGNOSIS — Z79899 Other long term (current) drug therapy: Secondary | ICD-10-CM | POA: Insufficient documentation

## 2017-12-11 DIAGNOSIS — C9002 Multiple myeloma in relapse: Secondary | ICD-10-CM

## 2017-12-11 DIAGNOSIS — Z5112 Encounter for antineoplastic immunotherapy: Secondary | ICD-10-CM | POA: Insufficient documentation

## 2017-12-11 DIAGNOSIS — D61810 Antineoplastic chemotherapy induced pancytopenia: Secondary | ICD-10-CM | POA: Diagnosis not present

## 2017-12-11 DIAGNOSIS — D801 Nonfamilial hypogammaglobulinemia: Secondary | ICD-10-CM | POA: Diagnosis not present

## 2017-12-11 DIAGNOSIS — T451X5A Adverse effect of antineoplastic and immunosuppressive drugs, initial encounter: Secondary | ICD-10-CM

## 2017-12-11 LAB — COMPREHENSIVE METABOLIC PANEL
ALT: 15 U/L (ref 0–44)
AST: 16 U/L (ref 15–41)
Albumin: 3.6 g/dL (ref 3.5–5.0)
Alkaline Phosphatase: 59 U/L (ref 38–126)
Anion gap: 10 (ref 5–15)
BUN: 13 mg/dL (ref 6–20)
CHLORIDE: 106 mmol/L (ref 98–111)
CO2: 27 mmol/L (ref 22–32)
CREATININE: 0.75 mg/dL (ref 0.44–1.00)
Calcium: 8.3 mg/dL — ABNORMAL LOW (ref 8.9–10.3)
GFR calc Af Amer: >60 mL/min (ref >60)
Glucose, Bld: 91 mg/dL (ref 70–99)
Potassium: 3.9 mmol/L (ref 3.5–5.1)
Sodium: 143 mmol/L (ref 135–145)
Total Bilirubin: 0.4 mg/dL (ref 0.3–1.2)
Total Protein: 5.4 g/dL — ABNORMAL LOW (ref 6.5–8.1)

## 2017-12-11 LAB — CBC WITH DIFFERENTIAL/PLATELET
BASOS ABS: 0.1 10*3/uL (ref 0.0–0.1)
Basophils Relative: 4 %
Eosinophils Absolute: 0.5 10*3/uL (ref 0.0–0.5)
Eosinophils Relative: 13 %
HEMATOCRIT: 34.5 % — AB (ref 34.8–46.6)
Hemoglobin: 11.6 g/dL (ref 11.6–15.9)
LYMPHS PCT: 26 %
Lymphs Abs: 0.9 10*3/uL (ref 0.9–3.3)
MCH: 34.9 pg — ABNORMAL HIGH (ref 25.1–34.0)
MCHC: 33.8 g/dL (ref 31.5–36.0)
MCV: 103.4 fL — AB (ref 79.5–101.0)
Monocytes Absolute: 0.6 10*3/uL (ref 0.1–0.9)
Monocytes Relative: 18 %
Neutro Abs: 1.4 10*3/uL — ABNORMAL LOW (ref 1.5–6.5)
Neutrophils Relative %: 39 %
Platelets: 80 10*3/uL — ABNORMAL LOW (ref 145–400)
RBC: 3.34 MIL/uL — ABNORMAL LOW (ref 3.70–5.45)
RDW: 16.3 % — ABNORMAL HIGH (ref 11.2–14.5)
WBC: 3.5 10*3/uL — AB (ref 3.9–10.3)

## 2017-12-11 MED ORDER — SODIUM CHLORIDE 0.9% FLUSH
10.0000 mL | INTRAVENOUS | Status: DC | PRN
  Administered 2017-12-11: 10 mL
  Filled 2017-12-11: qty 10

## 2017-12-11 MED ORDER — DIPHENHYDRAMINE HCL 25 MG PO CAPS
ORAL_CAPSULE | ORAL | Status: AC
  Filled 2017-12-11: qty 2

## 2017-12-11 MED ORDER — SODIUM CHLORIDE 0.9 % IV SOLN
Freq: Once | INTRAVENOUS | Status: AC
  Administered 2017-12-11: 09:00:00 via INTRAVENOUS
  Filled 2017-12-11: qty 250

## 2017-12-11 MED ORDER — ACETAMINOPHEN 325 MG PO TABS
ORAL_TABLET | ORAL | Status: AC
  Filled 2017-12-11: qty 2

## 2017-12-11 MED ORDER — ACETAMINOPHEN 325 MG PO TABS
650.0000 mg | ORAL_TABLET | Freq: Once | ORAL | Status: AC
  Administered 2017-12-11: 650 mg via ORAL

## 2017-12-11 MED ORDER — PROCHLORPERAZINE MALEATE 10 MG PO TABS
ORAL_TABLET | ORAL | Status: AC
  Filled 2017-12-11: qty 1

## 2017-12-11 MED ORDER — DIPHENHYDRAMINE HCL 25 MG PO CAPS
50.0000 mg | ORAL_CAPSULE | Freq: Once | ORAL | Status: AC
  Administered 2017-12-11: 50 mg via ORAL

## 2017-12-11 MED ORDER — PROCHLORPERAZINE MALEATE 10 MG PO TABS
10.0000 mg | ORAL_TABLET | Freq: Once | ORAL | Status: AC
  Administered 2017-12-11: 10 mg via ORAL

## 2017-12-11 MED ORDER — SODIUM CHLORIDE 0.9 % IV SOLN
15.2000 mg/kg | Freq: Once | INTRAVENOUS | Status: AC
  Administered 2017-12-11: 900 mg via INTRAVENOUS
  Filled 2017-12-11: qty 40

## 2017-12-11 MED ORDER — SODIUM CHLORIDE 0.9 % IV SOLN
20.0000 mg | Freq: Once | INTRAVENOUS | Status: AC
  Administered 2017-12-11: 20 mg via INTRAVENOUS
  Filled 2017-12-11: qty 2

## 2017-12-11 MED ORDER — HEPARIN SOD (PORK) LOCK FLUSH 100 UNIT/ML IV SOLN
500.0000 [IU] | Freq: Once | INTRAVENOUS | Status: AC | PRN
  Administered 2017-12-11: 500 [IU]
  Filled 2017-12-11: qty 5

## 2017-12-11 NOTE — Assessment & Plan Note (Addendum)
We discussed neutropenic precaution We will proceed with daratumumab today There is no contraindication to remain on antiplatelet agents or anticoagulants as long as the platelet is greater than 50,000. I recommend she hold her treatment and start her 7 days of break before resumption of treatment the following week.

## 2017-12-11 NOTE — Telephone Encounter (Signed)
Gave pts husband avs and calendar of upcoming appts.

## 2017-12-11 NOTE — Telephone Encounter (Signed)
Gave pt avs and calendar  °

## 2017-12-11 NOTE — Assessment & Plan Note (Addendum)
She is not symptomatic with infection We discussed neutropenic precaution and influenza vaccination She would like to wait until next month before proceed with influenza vaccination

## 2017-12-11 NOTE — Patient Instructions (Signed)
[facility] Discharge Instructions for Patients Receiving Chemotherapy  Today you received the following chemotherapy agents: daratumumab (Darzalex).  To help prevent nausea and vomiting after your treatment, we encourage you to take your nausea medication as directed.   If you develop nausea and vomiting that is not controlled by your nausea medication, call the clinic.   BELOW ARE SYMPTOMS THAT SHOULD BE REPORTED IMMEDIATELY:  *FEVER GREATER THAN 100.5 F  *CHILLS WITH OR WITHOUT FEVER  NAUSEA AND VOMITING THAT IS NOT CONTROLLED WITH YOUR NAUSEA MEDICATION  *UNUSUAL SHORTNESS OF BREATH  *UNUSUAL BRUISING OR BLEEDING  TENDERNESS IN MOUTH AND THROAT WITH OR WITHOUT PRESENCE OF ULCERS  *URINARY PROBLEMS  *BOWEL PROBLEMS  UNUSUAL RASH Items with * indicate a potential emergency and should be followed up as soon as possible.  Feel free to call the clinic should you have any questions or concerns. The clinic phone number is [phone].  Please show the CHEMO ALERT CARD at check-in to the Emergency Department and triage nurse.   

## 2017-12-11 NOTE — Assessment & Plan Note (Signed)
I reviewed recent myeloma panel with the patient and her husband She responded well to treatment with stabilization of her serum light chain She is asymptomatic from side effects of treatment except for mild pancytopenia We will continue treatment indefinitely She will continue antimicrobial prophylaxis with valacyclovir She will continue calcium with vitamin D and Zometa every 6 months, her next dose will be due in December

## 2017-12-11 NOTE — Progress Notes (Signed)
Per Dr. Alvy Bimler ok to tx. Do not have to wait for CMP to result.

## 2017-12-11 NOTE — Progress Notes (Signed)
[place] OFFICE PROGRESS NOTE  Patient Care Team: [name], MD as PCP - General [name], MD as Consulting Physician (Oncology) [name], MD as Consulting Physician (Neurosurgery) [name], MD as Consulting Physician (Hematology and Oncology) [name], [name], MD as Referring Physician (Hematology and Oncology)  ASSESSMENT & PLAN:  Multiple myeloma in relapse [facility]) I reviewed recent myeloma panel with the patient and her husband She responded well to treatment with stabilization of her serum light chain She is asymptomatic from side effects of treatment except for mild pancytopenia We will continue treatment indefinitely She will continue antimicrobial prophylaxis with valacyclovir She will continue calcium with vitamin D and Zometa every 6 months, her next dose will be due in December  Pancytopenia due to antineoplastic chemotherapy [facility]) We discussed neutropenic precaution We will proceed with daratumumab today There is no contraindication to remain on antiplatelet agents or anticoagulants as long as the platelet is greater than 50,000. I recommend she hold her treatment and start her 7 days of break before resumption of treatment the following week.   Hypogammaglobulinemia, acquired She is not symptomatic with infection We discussed neutropenic precaution and influenza vaccination She would like to wait until next month before proceed with influenza vaccination   No orders of the defined types were placed in this encounter.   INTERVAL HISTORY: Please see below for problem oriented charting. She is seen in the infusion room with her husband present She feels well Complain of mild fatigue Denies recent new bone pain. No recent infection, fever or chills The patient denies any recent signs or symptoms of bleeding such as spontaneous epistaxis, hematuria or hematochezia.  SUMMARY OF ONCOLOGIC HISTORY:   Multiple myeloma in  relapse ([place])   07/21/2007 Bone Marrow Biopsy    Case #: [id number] Bone marrow showed myeloma    07/21/2007 Miscellaneous    She was diagnosed in May 2009. Had several cycles of RVD    10/14/2007 Bone Marrow Biopsy    Case #: [id number] repeat bone marrow biopsy showed good response to Rx    12/31/2007 Bone Marrow Transplant    She had autologous BMT at [facility]    07/19/2009 Bone Marrow Biopsy    Case #: [id number] Bone marrow biopsy showed only 1% plasma cells    10/10/2010 Bone Marrow Biopsy    [id number] Bone marrow biopsy showed 2% plasma cells    09/25/2011 Bone Marrow Biopsy    Accession #: [id number] Bone marrow only showed 4% plasma cells with ligh chain excess    10/14/2011 - 06/13/2013 Chemotherapy    She received Revlimid only, discontinued due to pancytopenia    06/18/2015 - 09/13/2015 Chemotherapy    She resumed taking Revlimid and dexamethasone. Treatment is stopped due to minimum response and pancytopenia    10/19/2015 -  Chemotherapy    The patient had Velcade for chemotherapy treatment along with weekly Daratumumab. Last dose of Velcade on 02/29/16, stopped due to progression. Pomalidomide added on 03/14/16    04/11/2016 Adverse Reaction    Delay resumption of Pomalyst cycle 2 until 04/16/16 due to neutropenia    09/16/2016 PET scan    1. No findings of active bony lymphoma. Prior activity in the sternum and thoracic spine has resolved. 2. Focal hypermetabolic activity along the sigmoid colon with some local inflammatory stranding in the adjacent mesentery, maximum SUV 11.0. This is probably from mild acute diverticulitis. There is no hypermetabolic activity in this vicinity 4 months ago to  suggest that this is from colon cancer. Correlate with any symptoms. 3. Acute right maxillary sinusitis. 4. Thoracolumbar compression fractures, vertebral augmentation at the L1 level.    09/17/2016 Procedure    CT guided bone marrow biopsy of right posterior iliac bone with both aspirate  and core samples obtained.    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 - TRILINEAGE HEMATOPOIESIS. - SEE COMMENT. PERIPHERAL BLOOD: - LYMPHOPENIA. - THROMBOCYTOPENIA. Diagnosis Note The bone marrow is slightly hypocellular for age with trilineage hematopoiesis and non-specific myeloid changes likely related to previous therapy. Plasma cells are increased in number representing 8% of all cells in the aspirate, but with lack of large aggregates or sheets in the clot and biopsy sections. Immunohistochemical stains highlight the slightly increased plasma cell component in the marrow which shows kappa light chain restriction consistent with residual/recurrent plasma cell neoplasm. Correlation with cytogenetic and FISH studies recommended    09/17/2016 Pathology Results    Normal bone marrow cytogenetics and FISH     REVIEW OF SYSTEMS:   Constitutional: Denies fevers, chills or abnormal weight loss Eyes: Denies blurriness of vision Ears, nose, mouth, throat, and face: Denies mucositis or sore throat Respiratory: Denies cough, dyspnea or wheezes Cardiovascular: Denies palpitation, chest discomfort or lower extremity swelling Gastrointestinal:  Denies nausea, heartburn or change in bowel habits Skin: Denies abnormal skin rashes Lymphatics: Denies new lymphadenopathy or easy bruising Neurological:Denies numbness, tingling or new weaknesses Behavioral/Psych: Mood is stable, no new changes  All other systems were reviewed with the patient and are negative.  I have reviewed the past medical history, past surgical history, social history and family history with the patient and they are unchanged from previous note.  ALLERGIES:  is allergic to hydromorphone.  MEDICATIONS:  Current Outpatient Medications  Medication Sig Dispense Refill  . aspirin 81 MG tablet Take 81 mg by mouth.     . cholecalciferol (VITAMIN D) 1000 UNITS tablet Take 1,000 Units by mouth daily.    Marland Kitchen dexamethasone (DECADRON) 4 MG tablet TAKE 5 TABLETS ONCE WEEKLY WITH BREAKFAST EVERY TUESDAY. 20 tablet 0  . Multiple Vitamins-Minerals (ONE-A-DAY 50 PLUS PO) Take by mouth daily.    Marland Kitchen oxycodone (OXY-IR) 5 MG capsule Take 5 mg by mouth every 4 (four) hours as needed. pain    . Oxycodone HCl (OXYCONTIN) 60 MG TB12 Take 1 tablet by mouth 2 (two) times daily.    . pantoprazole (PROTONIX) 40 MG tablet TAKE (1) TABLET DAILY AS NEEDED. 30 tablet 0  . pomalidomide (POMALYST) 3 MG capsule TAKE 1 CAPSULE BY MOUTH ONCE DAILY ON DAYS 1-21 EVERY 28 DAYS 21 capsule 11  . valACYclovir (VALTREX) 1000 MG tablet Take 1 tablet (1,000 mg total) by mouth daily. 90 tablet 11   No current facility-administered medications for this visit.     PHYSICAL EXAMINATION: ECOG PERFORMANCE STATUS: 1 - Symptomatic but completely ambulatory GENERAL:alert, no distress and comfortable SKIN: skin color, texture, turgor are normal, no rashes or significant lesions EYES: normal, Conjunctiva are pink and non-injected, sclera clear OROPHARYNX:no exudate, no erythema and lips, buccal mucosa, and tongue normal  NECK: supple, thyroid normal size, non-tender, without nodularity LYMPH:  no palpable lymphadenopathy in the cervical, axillary or inguinal LUNGS: clear to auscultation and percussion with normal breathing effort HEART: regular rate & rhythm and no murmurs and no lower extremity edema ABDOMEN:abdomen soft, non-tender and normal bowel sounds Musculoskeletal:no  cyanosis of digits and no clubbing  NEURO: alert & oriented x 3 with fluent speech, no focal motor/sensory deficits  LABORATORY DATA:  I have reviewed the data as listed    Component Value Date/Time   NA 142 11/13/2017 0807   NA 140 03/11/2017 1150   K 3.7 11/13/2017 0807   K 3.9 03/11/2017 1150   CL 106 11/13/2017 0807   CL 107 08/30/2012 1055   CO2 25 11/13/2017 0807    CO2 29 03/11/2017 1150   GLUCOSE 85 11/13/2017 0807   GLUCOSE 93 03/11/2017 1150   GLUCOSE 102 (H) 08/30/2012 1055   BUN 15 11/13/2017 0807   BUN 12.5 03/11/2017 1150   CREATININE 0.72 11/13/2017 0807   CREATININE 0.8 03/11/2017 1150   CALCIUM 9.2 11/13/2017 0807   CALCIUM 8.7 03/11/2017 1150   PROT 5.8 (L) 11/13/2017 0807   PROT 6.0 (L) 03/11/2017 1150   ALBUMIN 3.9 11/13/2017 0807   ALBUMIN 3.8 03/11/2017 1150   AST 20 11/13/2017 0807   AST 18 03/11/2017 1150   ALT 9 11/13/2017 0807   ALT 14 03/11/2017 1150   ALKPHOS 52 11/13/2017 0807   ALKPHOS 45 03/11/2017 1150   BILITOT 0.6 11/13/2017 0807   BILITOT 0.37 03/11/2017 1150   GFRNONAA >60 11/13/2017 0807   GFRAA >60 11/13/2017 0807    No results found for: SPEP, UPEP  Lab Results  Component Value Date   WBC 3.5 (L) 12/11/2017   NEUTROABS 1.4 (L) 12/11/2017   HGB 11.6 12/11/2017   HCT 34.5 (L) 12/11/2017   MCV 103.4 (H) 12/11/2017   PLT 80 (L) 12/11/2017      Chemistry      Component Value Date/Time   NA 142 11/13/2017 0807   NA 140 03/11/2017 1150   K 3.7 11/13/2017 0807   K 3.9 03/11/2017 1150   CL 106 11/13/2017 0807   CL 107 08/30/2012 1055   CO2 25 11/13/2017 0807   CO2 29 03/11/2017 1150   BUN 15 11/13/2017 0807   BUN 12.5 03/11/2017 1150   CREATININE 0.72 11/13/2017 0807   CREATININE 0.8 03/11/2017 1150      Component Value Date/Time   CALCIUM 9.2 11/13/2017 0807   CALCIUM 8.7 03/11/2017 1150   ALKPHOS 52 11/13/2017 0807   ALKPHOS 45 03/11/2017 1150   AST 20 11/13/2017 0807   AST 18 03/11/2017 1150   ALT 9 11/13/2017 0807   ALT 14 03/11/2017 1150   BILITOT 0.6 11/13/2017 0807   BILITOT 0.37 03/11/2017 1150      All questions were answered. The patient knows to call the clinic with any problems, questions or concerns. No barriers to learning was detected.  I spent 15 minutes counseling the patient face to face. The total time spent in the appointment was 20 minutes and more than 50% was on  counseling and review of test results  [name], MD 12/11/2017 9:03 AM

## 2017-12-12 IMAGING — DX DG THORACIC SPINE 2V
3 series · 3 of 3 positions shown · non-contrast
Comparison: Bone survey 07/14/2012 .

CLINICAL DATA: Multiple myeloma.  Back pain.

EXAM:
THORACIC SPINE 2 VIEWS

[t-spine ap]
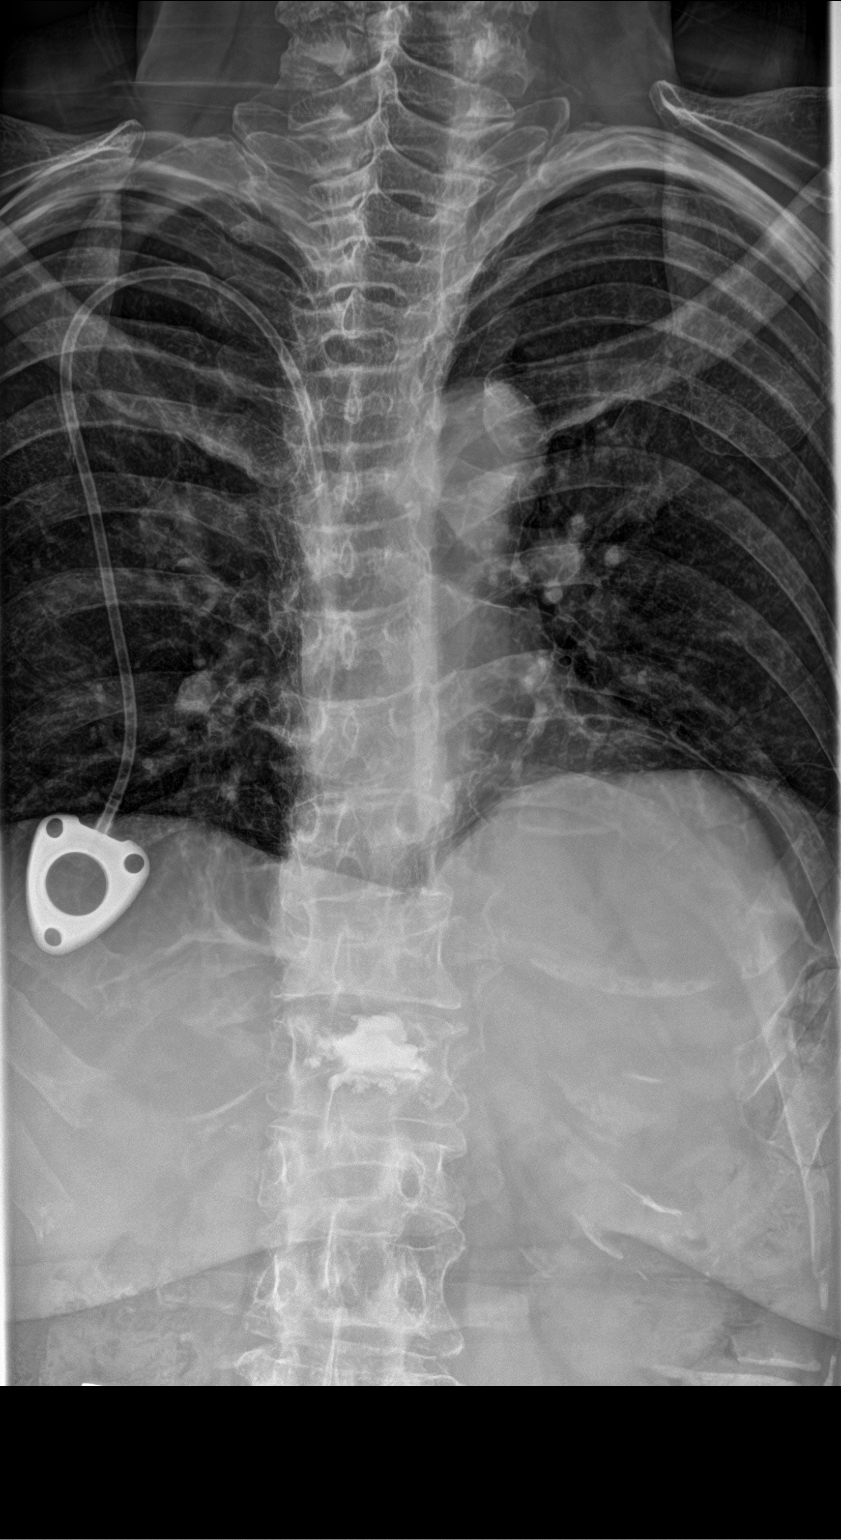

[t-spine lat]
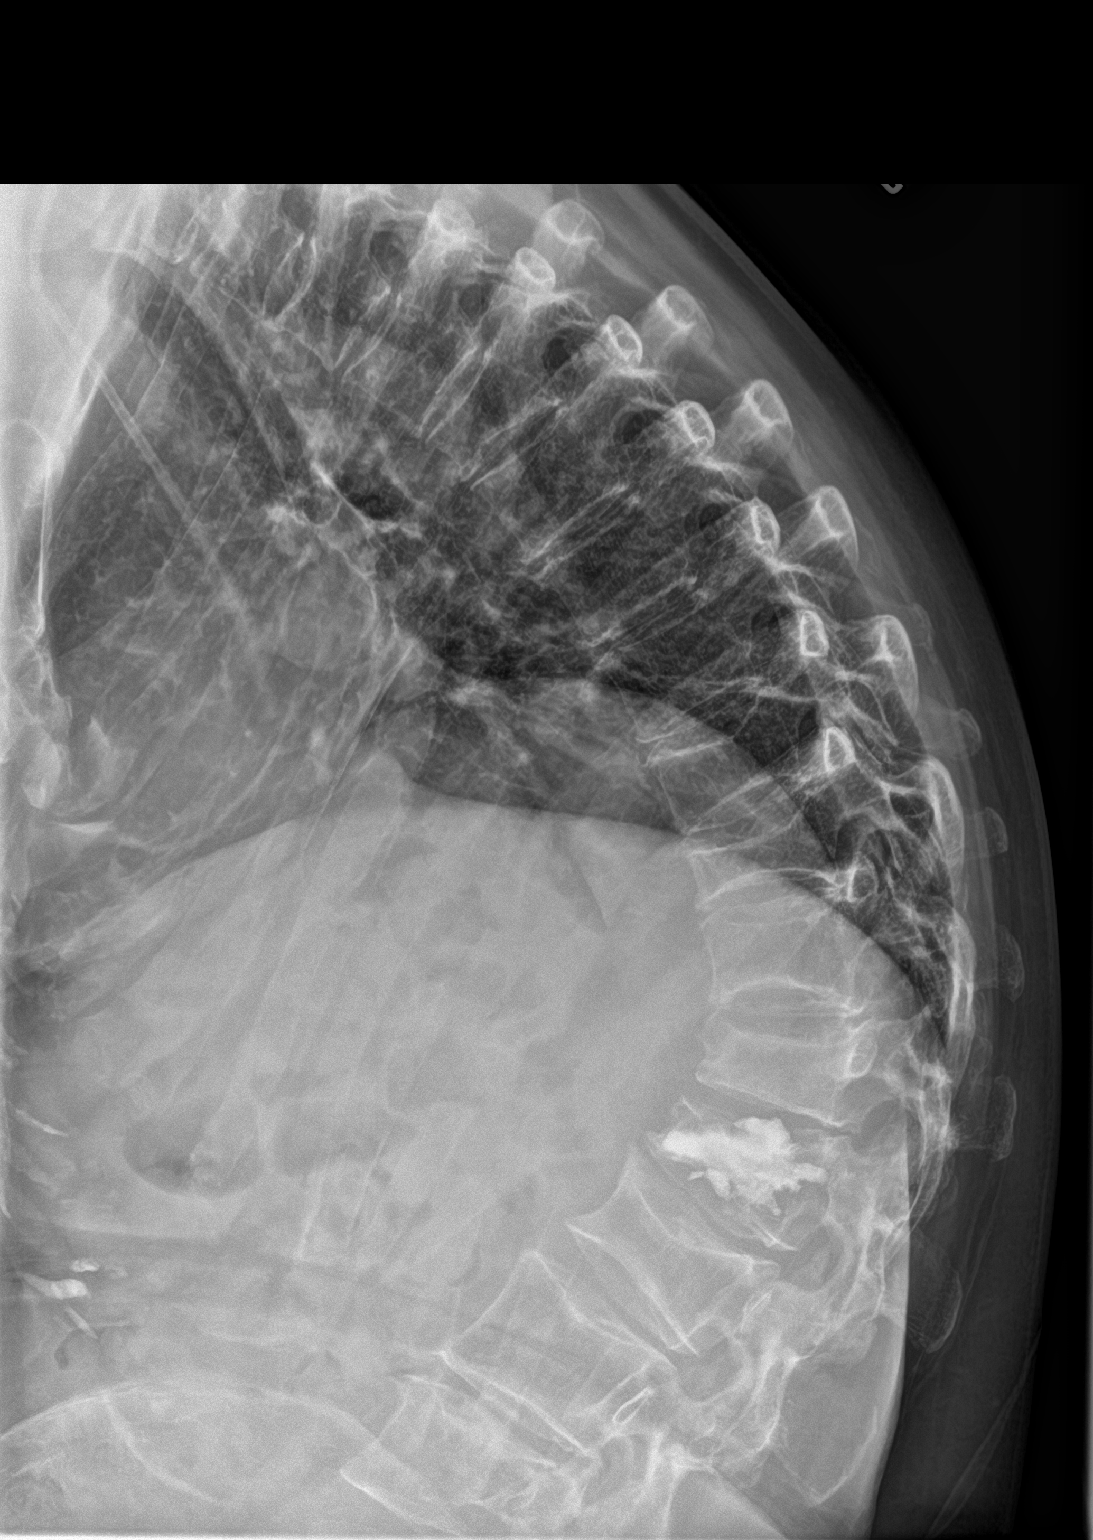

[t-spine swimmers]
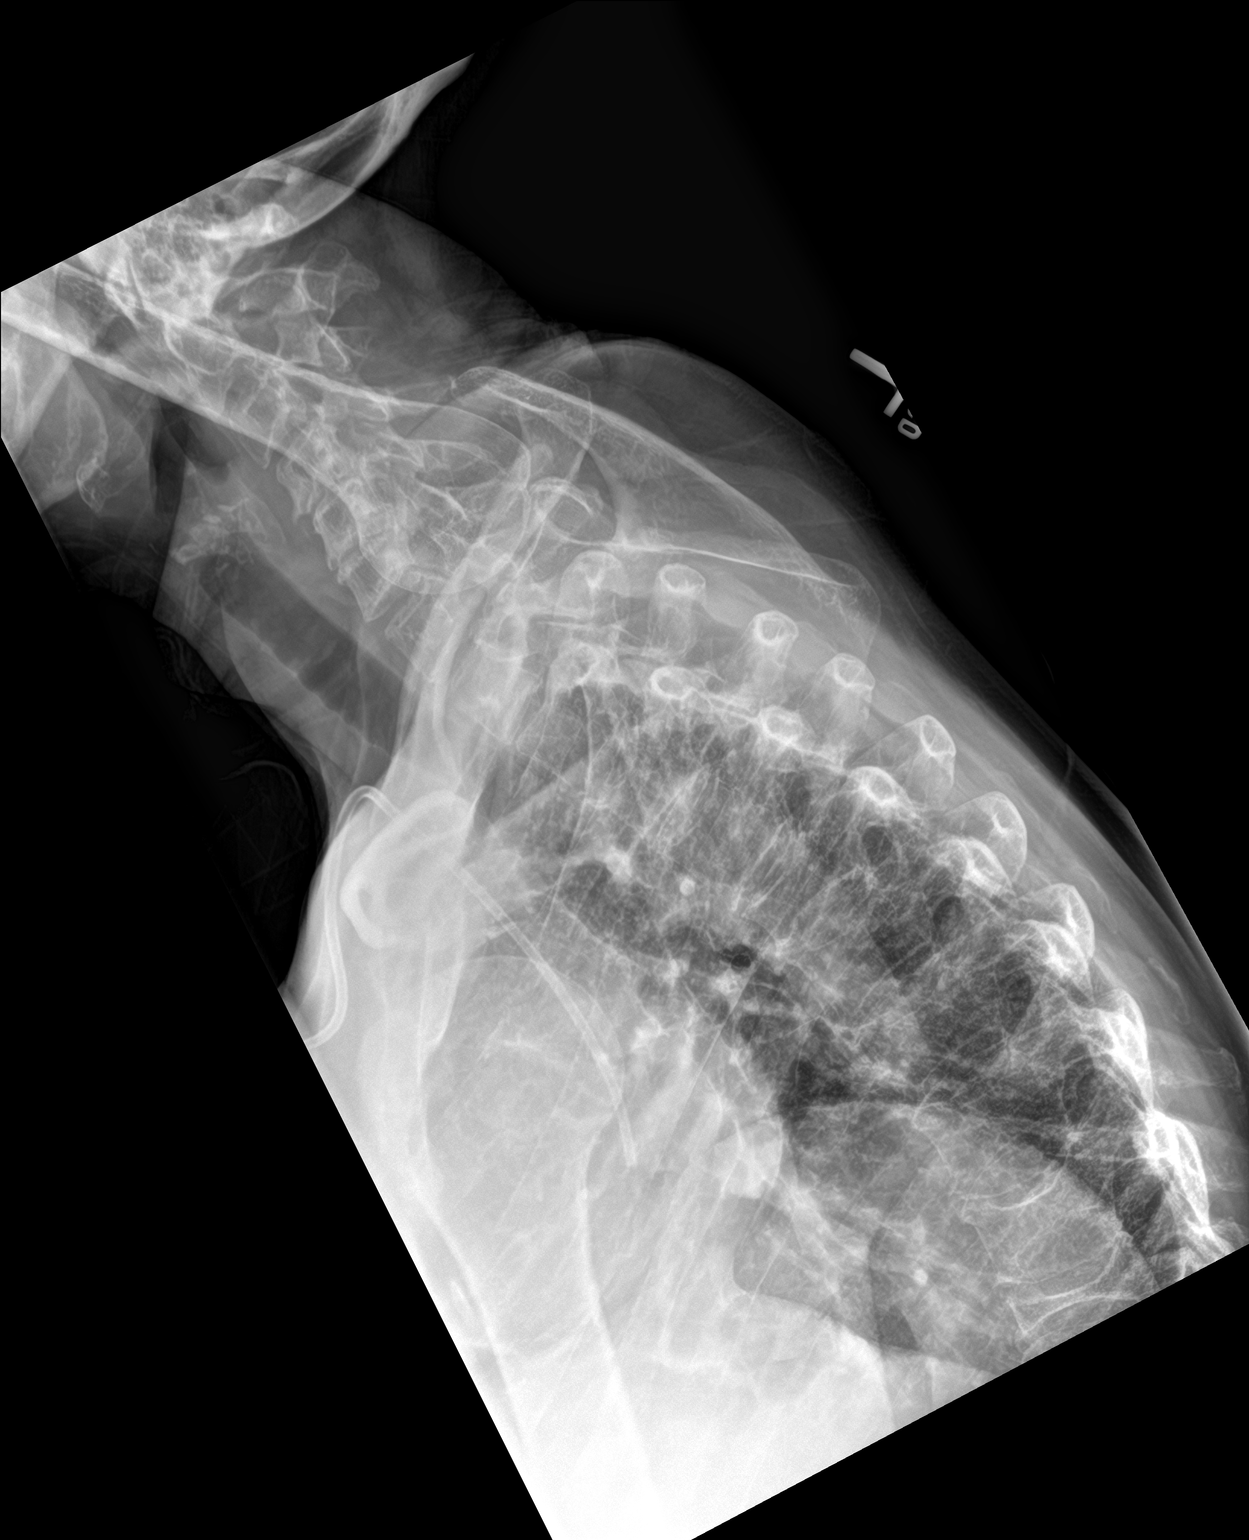

[3 of 3 positions shown; findings below may reference images not displayed]

FINDINGS: PowerPort catheter noted with tip in stable position. Prior L1
vertebroplasty. Stable multiple thoracic and lumbar spine
compressions and diffuse osteopenia. No new compression fracture.
Stable degenerative change in kyphosis .
IMPRESSION: Stable L1 vertebroplasty. Stable multiple thoracic and lumbar spine
compressions. No new compression fracture. Stable diffuse
osteopenia, degenerative change, and kyphosis.

## 2017-12-14 LAB — MULTIPLE MYELOMA PANEL, SERUM
ALBUMIN/GLOB SERPL: 2.2 — AB (ref 0.7–1.7)
Albumin SerPl Elph-Mcnc: 3.4 g/dL (ref 2.9–4.4)
Alpha 1: 0.2 g/dL (ref 0.0–0.4)
Alpha2 Glob SerPl Elph-Mcnc: 0.5 g/dL (ref 0.4–1.0)
B-Globulin SerPl Elph-Mcnc: 0.6 g/dL — ABNORMAL LOW (ref 0.7–1.3)
Gamma Glob SerPl Elph-Mcnc: 0.2 g/dL — ABNORMAL LOW (ref 0.4–1.8)
Globulin, Total: 1.6 g/dL — ABNORMAL LOW (ref 2.2–3.9)
IGA: 12 mg/dL — AB (ref 87–352)
IgG (Immunoglobin G), Serum: 270 mg/dL — ABNORMAL LOW (ref 700–1600)
IgM (Immunoglobulin M), Srm: 19 mg/dL — ABNORMAL LOW (ref 26–217)
M Protein SerPl Elph-Mcnc: 0.1 g/dL — ABNORMAL HIGH
Total Protein ELP: 5 g/dL — ABNORMAL LOW (ref 6.0–8.5)

## 2017-12-14 LAB — KAPPA/LAMBDA LIGHT CHAINS
KAPPA FREE LGHT CHN: 53.2 mg/L — AB (ref 3.3–19.4)
Kappa, lambda light chain ratio: 12.37 — ABNORMAL HIGH (ref 0.26–1.65)
Lambda free light chains: 4.3 mg/L — ABNORMAL LOW (ref 5.7–26.3)

## 2017-12-15 ENCOUNTER — Telehealth: Payer: Self-pay

## 2017-12-15 NOTE — Telephone Encounter (Signed)
Called and left below message. Instructed to call office for questions.

## 2017-12-15 NOTE — Telephone Encounter (Signed)
-----   Message from [name], MD sent at 12/14/2017  3:06 PM EDT ----- Regarding: light chain ratio Tell her myeloma panel light chain ratio is a bit up from last month but overall stable ----- Message ----- From: [facility] [place] Sent: 12/11/2017   8:28 AM EDT To: [name], MD

## 2018-01-04 ENCOUNTER — Telehealth: Payer: Self-pay | Admitting: Hematology and Oncology

## 2018-01-04 ENCOUNTER — Other Ambulatory Visit: Payer: Self-pay | Admitting: Hematology and Oncology

## 2018-01-04 NOTE — Telephone Encounter (Signed)
-

## 2018-01-04 NOTE — Telephone Encounter (Signed)
NG out of office 10/30 to 11/29 - moved 11/1 and 11/29 f/u appointments to [facility]. Spoke with patient she is aware. [place] also aware treatment start time for both appointments will be 7:30 am and patient is seen by provider in infusion.

## 2018-01-08 ENCOUNTER — Inpatient Hospital Stay: Payer: 59 | Attending: Hematology and Oncology

## 2018-01-08 ENCOUNTER — Inpatient Hospital Stay: Payer: 59

## 2018-01-08 ENCOUNTER — Inpatient Hospital Stay (HOSPITAL_BASED_OUTPATIENT_CLINIC_OR_DEPARTMENT_OTHER): Payer: 59 | Admitting: Oncology

## 2018-01-08 ENCOUNTER — Encounter: Payer: Self-pay | Admitting: Oncology

## 2018-01-08 VITALS — BP 131/86 | HR 86 | Temp 98.6°F | Resp 18 | Wt 127.2 lb

## 2018-01-08 DIAGNOSIS — T451X5A Adverse effect of antineoplastic and immunosuppressive drugs, initial encounter: Secondary | ICD-10-CM

## 2018-01-08 DIAGNOSIS — Z79899 Other long term (current) drug therapy: Secondary | ICD-10-CM

## 2018-01-08 DIAGNOSIS — Z5112 Encounter for antineoplastic immunotherapy: Secondary | ICD-10-CM | POA: Insufficient documentation

## 2018-01-08 DIAGNOSIS — D801 Nonfamilial hypogammaglobulinemia: Secondary | ICD-10-CM

## 2018-01-08 DIAGNOSIS — C9002 Multiple myeloma in relapse: Secondary | ICD-10-CM | POA: Diagnosis not present

## 2018-01-08 DIAGNOSIS — D61810 Antineoplastic chemotherapy induced pancytopenia: Secondary | ICD-10-CM

## 2018-01-08 DIAGNOSIS — Z23 Encounter for immunization: Secondary | ICD-10-CM

## 2018-01-08 LAB — CBC WITH DIFFERENTIAL/PLATELET
ABS IMMATURE GRANULOCYTES: 0 10*3/uL (ref 0.00–0.07)
BASOS PCT: 3 %
Basophils Absolute: 0.1 10*3/uL (ref 0.0–0.1)
EOS ABS: 0.7 10*3/uL — AB (ref 0.0–0.5)
Eosinophils Relative: 21 %
HCT: 34.7 % — ABNORMAL LOW (ref 36.0–46.0)
Hemoglobin: 11.6 g/dL — ABNORMAL LOW (ref 12.0–15.0)
IMMATURE GRANULOCYTES: 0 %
LYMPHS ABS: 1.1 10*3/uL (ref 0.7–4.0)
Lymphocytes Relative: 34 %
MCH: 34.6 pg — ABNORMAL HIGH (ref 26.0–34.0)
MCHC: 33.4 g/dL (ref 30.0–36.0)
MCV: 103.6 fL — ABNORMAL HIGH (ref 80.0–100.0)
MONOS PCT: 19 %
Monocytes Absolute: 0.6 10*3/uL (ref 0.1–1.0)
Neutro Abs: 0.7 10*3/uL — ABNORMAL LOW (ref 1.7–7.7)
Neutrophils Relative %: 23 %
PLATELETS: 74 10*3/uL — AB (ref 150–400)
RBC: 3.35 MIL/uL — ABNORMAL LOW (ref 3.87–5.11)
RDW: 13.7 % (ref 11.5–15.5)
WBC: 3.1 10*3/uL — ABNORMAL LOW (ref 4.0–10.5)
nRBC: 0 % (ref 0.0–0.2)

## 2018-01-08 LAB — COMPREHENSIVE METABOLIC PANEL
ALBUMIN: 3.8 g/dL (ref 3.5–5.0)
ALT: 12 U/L (ref 0–44)
AST: 18 U/L (ref 15–41)
Alkaline Phosphatase: 51 U/L (ref 38–126)
Anion gap: 11 (ref 5–15)
BUN: 11 mg/dL (ref 6–20)
CHLORIDE: 105 mmol/L (ref 98–111)
CO2: 27 mmol/L (ref 22–32)
CREATININE: 0.77 mg/dL (ref 0.44–1.00)
Calcium: 8.7 mg/dL — ABNORMAL LOW (ref 8.9–10.3)
GFR calc Af Amer: >60 mL/min (ref >60)
GFR calc non Af Amer: >60 mL/min (ref >60)
GLUCOSE: 93 mg/dL (ref 70–99)
POTASSIUM: 3.6 mmol/L (ref 3.5–5.1)
Sodium: 143 mmol/L (ref 135–145)
Total Bilirubin: 0.7 mg/dL (ref 0.3–1.2)
Total Protein: 5.6 g/dL — ABNORMAL LOW (ref 6.5–8.1)

## 2018-01-08 MED ORDER — ACETAMINOPHEN 325 MG PO TABS
ORAL_TABLET | ORAL | Status: AC
  Filled 2018-01-08: qty 2

## 2018-01-08 MED ORDER — INFLUENZA VAC SPLIT QUAD 0.5 ML IM SUSY
0.5000 mL | PREFILLED_SYRINGE | Freq: Once | INTRAMUSCULAR | Status: AC
  Administered 2018-01-08: 0.5 mL via INTRAMUSCULAR

## 2018-01-08 MED ORDER — DIPHENHYDRAMINE HCL 25 MG PO CAPS
ORAL_CAPSULE | ORAL | Status: AC
  Filled 2018-01-08: qty 2

## 2018-01-08 MED ORDER — ACETAMINOPHEN 325 MG PO TABS
650.0000 mg | ORAL_TABLET | Freq: Once | ORAL | Status: AC
  Administered 2018-01-08: 650 mg via ORAL

## 2018-01-08 MED ORDER — SODIUM CHLORIDE 0.9% FLUSH
10.0000 mL | INTRAVENOUS | Status: DC | PRN
  Filled 2018-01-08: qty 10

## 2018-01-08 MED ORDER — SODIUM CHLORIDE 0.9 % IV SOLN
15.2000 mg/kg | Freq: Once | INTRAVENOUS | Status: AC
  Administered 2018-01-08: 900 mg via INTRAVENOUS
  Filled 2018-01-08: qty 40

## 2018-01-08 MED ORDER — HEPARIN SOD (PORK) LOCK FLUSH 100 UNIT/ML IV SOLN
500.0000 [IU] | Freq: Once | INTRAVENOUS | Status: DC | PRN
  Filled 2018-01-08: qty 5

## 2018-01-08 MED ORDER — INFLUENZA VAC SPLIT QUAD 0.5 ML IM SUSY
PREFILLED_SYRINGE | INTRAMUSCULAR | Status: AC
  Filled 2018-01-08: qty 0.5

## 2018-01-08 MED ORDER — DIPHENHYDRAMINE HCL 25 MG PO CAPS
50.0000 mg | ORAL_CAPSULE | Freq: Once | ORAL | Status: AC
  Administered 2018-01-08: 50 mg via ORAL

## 2018-01-08 MED ORDER — SODIUM CHLORIDE 0.9 % IV SOLN
Freq: Once | INTRAVENOUS | Status: AC
  Administered 2018-01-08: 09:00:00 via INTRAVENOUS
  Filled 2018-01-08: qty 250

## 2018-01-08 MED ORDER — SODIUM CHLORIDE 0.9 % IV SOLN
20.0000 mg | Freq: Once | INTRAVENOUS | Status: AC
  Administered 2018-01-08: 20 mg via INTRAVENOUS
  Filled 2018-01-08: qty 2

## 2018-01-08 MED ORDER — PROCHLORPERAZINE MALEATE 10 MG PO TABS
ORAL_TABLET | ORAL | Status: AC
  Filled 2018-01-08: qty 1

## 2018-01-08 MED ORDER — PROCHLORPERAZINE MALEATE 10 MG PO TABS
10.0000 mg | ORAL_TABLET | Freq: Once | ORAL | Status: AC
  Administered 2018-01-08: 10 mg via ORAL

## 2018-01-08 NOTE — Progress Notes (Signed)
Per Altamese Dilling, NP okay to treat with abnormal labs.

## 2018-01-08 NOTE — Patient Instructions (Signed)
[facility] Discharge Instructions for Patients Receiving Chemotherapy  Today you received the following chemotherapy agents: daratumumab (Darzalex).  To help prevent nausea and vomiting after your treatment, we encourage you to take your nausea medication as directed.   If you develop nausea and vomiting that is not controlled by your nausea medication, call the clinic.   BELOW ARE SYMPTOMS THAT SHOULD BE REPORTED IMMEDIATELY:  *FEVER GREATER THAN 100.5 F  *CHILLS WITH OR WITHOUT FEVER  NAUSEA AND VOMITING THAT IS NOT CONTROLLED WITH YOUR NAUSEA MEDICATION  *UNUSUAL SHORTNESS OF BREATH  *UNUSUAL BRUISING OR BLEEDING  TENDERNESS IN MOUTH AND THROAT WITH OR WITHOUT PRESENCE OF ULCERS  *URINARY PROBLEMS  *BOWEL PROBLEMS  UNUSUAL RASH Items with * indicate a potential emergency and should be followed up as soon as possible.  Feel free to call the clinic should you have any questions or concerns. The clinic phone number is [phone].  Please show the CHEMO ALERT CARD at check-in to the Emergency Department and triage nurse.   

## 2018-01-08 NOTE — Progress Notes (Signed)
[place] OFFICE PROGRESS NOTE  [name], MD [address] [place] [address]    Multiple myeloma in relapse [facility])   07/21/2007 Bone Marrow Biopsy    Case #: [id number] Bone marrow showed myeloma    07/21/2007 Miscellaneous    She was diagnosed in May 2009. Had several cycles of RVD    10/14/2007 Bone Marrow Biopsy    Case #: [id number] repeat bone marrow biopsy showed good response to Rx    12/31/2007 Bone Marrow Transplant    She had autologous BMT at [facility]    07/19/2009 Bone Marrow Biopsy    Case #: [id number] Bone marrow biopsy showed only 1% plasma cells    10/10/2010 Bone Marrow Biopsy    [id number] Bone marrow biopsy showed 2% plasma cells    09/25/2011 Bone Marrow Biopsy    Accession #: [id number] Bone marrow only showed 4% plasma cells with ligh chain excess    10/14/2011 - 06/13/2013 Chemotherapy    She received Revlimid only, discontinued due to pancytopenia    06/18/2015 - 09/13/2015 Chemotherapy    She resumed taking Revlimid and dexamethasone. Treatment is stopped due to minimum response and pancytopenia    10/19/2015 -  Chemotherapy    The patient had Velcade for chemotherapy treatment along with weekly Daratumumab. Last dose of Velcade on 02/29/16, stopped due to progression. Pomalidomide added on 03/14/16    04/11/2016 Adverse Reaction    Delay resumption of Pomalyst cycle 2 until 04/16/16 due to neutropenia    09/16/2016 PET scan    1. No findings of active bony lymphoma. Prior activity in the sternum and thoracic spine has resolved. 2. Focal hypermetabolic activity along the sigmoid colon with some local inflammatory stranding in the adjacent mesentery, maximum SUV 11.0. This is probably from mild acute diverticulitis. There is no hypermetabolic activity in this vicinity 4 months ago to suggest that this is from colon cancer. Correlate with any symptoms. 3. Acute right maxillary sinusitis. 4. Thoracolumbar compression  fractures, vertebral augmentation at the L1 level.    09/17/2016 Procedure    CT guided bone marrow biopsy of right posterior iliac bone with both aspirate and core samples obtained.    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 - TRILINEAGE HEMATOPOIESIS. - SEE COMMENT. PERIPHERAL BLOOD: - LYMPHOPENIA. - THROMBOCYTOPENIA. Diagnosis Note The bone marrow is slightly hypocellular for age with trilineage hematopoiesis and non-specific myeloid changes likely related to previous therapy. Plasma cells are increased in number representing 8% of all cells in the aspirate, but with lack of large aggregates or sheets in the clot and biopsy sections. Immunohistochemical stains highlight the slightly increased plasma cell component in the marrow which shows kappa light chain restriction consistent with residual/recurrent plasma cell neoplasm. Correlation with cytogenetic and FISH studies recommended    09/17/2016 Pathology Results    Normal bone marrow cytogenetics and FISH     CURRENT THERAPY: Daratumumab 15 mg/kg and Pomalyst 3 mg daily taken for 21 days on and 7 days off.  INTERVAL HISTORY: Jessica Cochran 57 y.o. female returns for routine follow-up visit accompanied by her husband.  She is seen in the infusion room today.  The patient is feeling well with the exception of fatigue.  She denies fevers and chills.  Denies chest pain, shortness of breath, cough, hemoptysis.  Denies nausea, vomiting, constipation, diarrhea.  Denies recent weight loss or night sweats.  Denies bleeding.  Pain controlled with OxyContin 60 mg twice a day.  Took her last dose of Pomalyst yesterday.  The patient is here for evaluation prior to her next cycle of daratumumab and Pomalyst.  MEDICAL HISTORY: Past Medical History:  Diagnosis Date  . Arthritis   . Cough 06/09/2014  . Dysuria 06/09/2014  . Herpes  zoster infection 04/29/2011  . Multiple myeloma in relapse ([place]) 10/12/2011   08/19/11: increase in kappa serum free light chains; 7/11: elevation urine protein (157 mg/24 hr) and reappearance monoclonal kappa light chains; 7/18: bone marrow biopsy 4% plasma cells; bone X-rays: no new lesions  . Multiple myeloma in remission ([place]) 04/29/2011  . Pelvic mass in female 08/28/2014  . Renal stone   . Yeast infection 06/08/2015    ALLERGIES:  is allergic to hydromorphone.  MEDICATIONS:  Current Outpatient Medications  Medication Sig Dispense Refill  . aspirin 81 MG tablet Take 81 mg by mouth.    . cholecalciferol (VITAMIN D) 1000 UNITS tablet Take 1,000 Units by mouth daily.    . Multiple Vitamins-Minerals (ONE-A-DAY 50 PLUS PO) Take by mouth daily.    Marland Kitchen oxycodone (OXY-IR) 5 MG capsule Take 5 mg by mouth every 4 (four) hours as needed. pain    . Oxycodone HCl (OXYCONTIN) 60 MG TB12 Take 1 tablet by mouth 2 (two) times daily.    . pantoprazole (PROTONIX) 40 MG tablet TAKE (1) TABLET DAILY AS NEEDED. 30 tablet 0  . POMALYST 3 MG capsule TAKE 1 CAPSULE BY MOUTH ONCE DAILY DAYS 1-21 EVERY 28 DAYS 21 capsule 11  . valACYclovir (VALTREX) 1000 MG tablet Take 1 tablet (1,000 mg total) by mouth daily. 90 tablet 11   No current facility-administered medications for this visit.    Facility-Administered Medications Ordered in Other Visits  Medication Dose Route Frequency Provider Last Rate Last Dose  . heparin lock flush 100 unit/mL  500 Units Intracatheter Once PRN Alvy Bimler, Ni, MD      . Influenza vac split quadrivalent PF (FLUARIX) injection 0.5 mL  0.5 mL Intramuscular Once Savannha Welle R, NP      . sodium chloride flush (NS) 0.9 % injection 10 mL  10 mL Intracatheter PRN [name], MD        SURGICAL HISTORY:  Past Surgical History:  Procedure Laterality Date  . BACK SURGERY     2009  . PILONIDAL CYST DRAINAGE  1991  . rectal abcess    . stem cell transplant autologus    . uterine fibroid  tumor removed     1990's    REVIEW OF SYSTEMS:   Review of Systems  Constitutional: Negative for appetite change, chills, fever and unexpected weight change.  Positive for fatigue. HENT:   Negative for mouth sores, nosebleeds, sore throat and trouble swallowing.   Eyes: Negative for eye problems and icterus.  Respiratory: Negative for cough, hemoptysis, shortness of breath and wheezing.   Cardiovascular: Negative for chest pain and leg swelling.  Gastrointestinal: Negative for abdominal pain, constipation, diarrhea, nausea and vomiting.  Genitourinary: Negative for bladder incontinence, difficulty urinating, dysuria, frequency and hematuria.   Musculoskeletal: Negative for back pain, gait problem, neck pain and neck stiffness.  Skin: Negative for itching and rash.  Neurological: Negative for dizziness, extremity weakness, gait problem, headaches, light-headedness and seizures.  Hematological: Negative for adenopathy. Does not bruise/bleed easily.  Psychiatric/Behavioral: Negative for confusion, depression and sleep disturbance. The patient is not nervous/anxious.  PHYSICAL EXAMINATION:  Vital signs: Temperature 97.8, pulse 77, respirations 16, blood pressure 123/66, O2 sat 100% on room air, weight 127 pounds.  ECOG PERFORMANCE STATUS: 1 - Symptomatic but completely ambulatory  Physical Exam  Constitutional: Oriented to person, place, and time and well-developed, well-nourished, and in no distress. No distress.  HENT:  Head: Normocephalic and atraumatic.  Mouth/Throat: Oropharynx is clear and moist. No oropharyngeal exudate.  Eyes: Conjunctivae are normal. Right eye exhibits no discharge. Left eye exhibits no discharge. No scleral icterus.  Neck: Normal range of motion. Neck supple.  Cardiovascular: Normal rate, regular rhythm, normal heart sounds and intact distal pulses.   Pulmonary/Chest: Effort normal and breath sounds normal. No respiratory distress. No wheezes. No rales.   Abdominal: Soft. Bowel sounds are normal. Exhibits no distension and no mass. There is no tenderness.  Musculoskeletal: Normal range of motion. Exhibits no edema.  Lymphadenopathy:    No cervical adenopathy.  Neurological: Alert and oriented to person, place, and time. Exhibits normal muscle tone. Gait normal. Coordination normal.  Skin: Skin is warm and dry. No rash noted. Not diaphoretic. No erythema. No pallor.  Psychiatric: Mood, memory and judgment normal.  Vitals reviewed.  LABORATORY DATA: Lab Results  Component Value Date   WBC 3.1 (L) 01/08/2018   HGB 11.6 (L) 01/08/2018   HCT 34.7 (L) 01/08/2018   MCV 103.6 (H) 01/08/2018   PLT 74 (L) 01/08/2018      Chemistry      Component Value Date/Time   NA 143 01/08/2018 0750   NA 140 03/11/2017 1150   K 3.6 01/08/2018 0750   K 3.9 03/11/2017 1150   CL 105 01/08/2018 0750   CL 107 08/30/2012 1055   CO2 27 01/08/2018 0750   CO2 29 03/11/2017 1150   BUN 11 01/08/2018 0750   BUN 12.5 03/11/2017 1150   CREATININE 0.77 01/08/2018 0750   CREATININE 0.8 03/11/2017 1150      Component Value Date/Time   CALCIUM 8.7 (L) 01/08/2018 0750   CALCIUM 8.7 03/11/2017 1150   ALKPHOS 51 01/08/2018 0750   ALKPHOS 45 03/11/2017 1150   AST 18 01/08/2018 0750   AST 18 03/11/2017 1150   ALT 12 01/08/2018 0750   ALT 14 03/11/2017 1150   BILITOT 0.7 01/08/2018 0750   BILITOT 0.37 03/11/2017 1150       RADIOGRAPHIC STUDIES:  No results found.   ASSESSMENT/PLAN:  Multiple myeloma in relapse [facility]) This is a pleasant 57 year old Caucasian female with multiple myeloma.  She is currently on treatment with daratumumab and Pomalyst and tolerating this well overall with the exception of mild fatigue and pancytopenia.  She has had stabilization and her serum light chains.  Labs from today have been reviewed with Dr Alen Blew and Dr. Calton Dach absence.  We will proceed with daratumumab today as scheduled.  The patient is supposed to begin her week  off of Pomalyst today however we will extend this to taking 2 weeks off.  Reviewed neutropenic and bleeding precautions with the patient.  Continue prophylaxis with valacyclovir.  Continue calcium and vitamin D.  She will receive Zometa every 6 months next dose is due in December.  Follow-up visit will be in 4 weeks for evaluation prior to her next cycle of chemotherapy.  Pancytopenia due to antineoplastic chemotherapy [facility]) Neutropenic precautions discussed with the patient.  We will proceed with daratumumab today.  Advised patient to hold Pomalyst or 14 days before resuming.  Hypogammaglobulinemia, acquired The patient is not symptomatic with  infection at this time.  We reviewed neutropenic precautions.  The patient will proceed with her influenza vaccine today per her request.   No orders of the defined types were placed in this encounter.    Mikey Bussing, DNP, AGPCNP-BC, AOCNP 01/08/18

## 2018-01-08 NOTE — Assessment & Plan Note (Signed)
Neutropenic precautions discussed with the patient.  We will proceed with daratumumab today.  Advised patient to hold Pomalyst or 14 days before resuming.

## 2018-01-08 NOTE — Assessment & Plan Note (Addendum)
This is a pleasant 57 year old Caucasian female with multiple myeloma.  She is currently on treatment with daratumumab and Pomalyst and tolerating this well overall with the exception of mild fatigue and pancytopenia.  She has had stabilization and her serum light chains.  Labs from today have been reviewed with Dr Alen Blew and Dr. Calton Dach absence.  We will proceed with daratumumab today as scheduled.  The patient is supposed to begin her week off of Pomalyst today however we will extend this to taking 2 weeks off.  Reviewed neutropenic and bleeding precautions with the patient.  Continue prophylaxis with valacyclovir.  Continue calcium and vitamin D.  She will receive Zometa every 6 months next dose is due in December.  Follow-up visit will be in 4 weeks for evaluation prior to her next cycle of chemotherapy.

## 2018-01-08 NOTE — Assessment & Plan Note (Signed)
The patient is not symptomatic with infection at this time.  We reviewed neutropenic precautions.  The patient will proceed with her influenza vaccine today per her request.

## 2018-01-11 LAB — KAPPA/LAMBDA LIGHT CHAINS
KAPPA FREE LGHT CHN: 52.3 mg/L — AB (ref 3.3–19.4)
Kappa, lambda light chain ratio: 14.94 — ABNORMAL HIGH (ref 0.26–1.65)
Lambda free light chains: 3.5 mg/L — ABNORMAL LOW (ref 5.7–26.3)

## 2018-01-12 ENCOUNTER — Telehealth: Payer: Self-pay

## 2018-01-12 NOTE — Telephone Encounter (Signed)
Faxed Pomalyst prior authorization completed form to [phone] at [email or web address].

## 2018-01-13 LAB — MULTIPLE MYELOMA PANEL, SERUM
ALPHA2 GLOB SERPL ELPH-MCNC: 0.5 g/dL (ref 0.4–1.0)
Albumin SerPl Elph-Mcnc: 3.6 g/dL (ref 2.9–4.4)
Albumin/Glob SerPl: 2.2 — ABNORMAL HIGH (ref 0.7–1.7)
Alpha 1: 0.2 g/dL (ref 0.0–0.4)
B-Globulin SerPl Elph-Mcnc: 0.7 g/dL (ref 0.7–1.3)
Gamma Glob SerPl Elph-Mcnc: 0.2 g/dL — ABNORMAL LOW (ref 0.4–1.8)
Globulin, Total: 1.7 g/dL — ABNORMAL LOW (ref 2.2–3.9)
IGG (IMMUNOGLOBIN G), SERUM: 252 mg/dL — AB (ref 700–1600)
IGM (IMMUNOGLOBULIN M), SRM: 15 mg/dL — AB (ref 26–217)
IgA: 12 mg/dL — ABNORMAL LOW (ref 87–352)
M Protein SerPl Elph-Mcnc: 0.1 g/dL — ABNORMAL HIGH
TOTAL PROTEIN ELP: 5.3 g/dL — AB (ref 6.0–8.5)

## 2018-02-05 ENCOUNTER — Inpatient Hospital Stay (HOSPITAL_BASED_OUTPATIENT_CLINIC_OR_DEPARTMENT_OTHER): Payer: 59 | Admitting: Oncology

## 2018-02-05 ENCOUNTER — Inpatient Hospital Stay: Payer: 59

## 2018-02-05 ENCOUNTER — Encounter: Payer: Self-pay | Admitting: Oncology

## 2018-02-05 VITALS — BP 144/88 | HR 77 | Temp 97.9°F | Resp 18

## 2018-02-05 DIAGNOSIS — C9002 Multiple myeloma in relapse: Secondary | ICD-10-CM

## 2018-02-05 DIAGNOSIS — Z79899 Other long term (current) drug therapy: Secondary | ICD-10-CM

## 2018-02-05 DIAGNOSIS — T451X5A Adverse effect of antineoplastic and immunosuppressive drugs, initial encounter: Secondary | ICD-10-CM | POA: Diagnosis not present

## 2018-02-05 DIAGNOSIS — D61810 Antineoplastic chemotherapy induced pancytopenia: Secondary | ICD-10-CM

## 2018-02-05 DIAGNOSIS — D801 Nonfamilial hypogammaglobulinemia: Secondary | ICD-10-CM

## 2018-02-05 LAB — CBC WITH DIFFERENTIAL/PLATELET
ABS IMMATURE GRANULOCYTES: 0.01 10*3/uL (ref 0.00–0.07)
BASOS ABS: 0 10*3/uL (ref 0.0–0.1)
Basophils Relative: 0 %
EOS ABS: 0.3 10*3/uL (ref 0.0–0.5)
Eosinophils Relative: 10 %
HEMATOCRIT: 35 % — AB (ref 36.0–46.0)
Hemoglobin: 11.8 g/dL — ABNORMAL LOW (ref 12.0–15.0)
IMMATURE GRANULOCYTES: 0 %
Lymphocytes Relative: 14 %
Lymphs Abs: 0.4 10*3/uL — ABNORMAL LOW (ref 0.7–4.0)
MCH: 35 pg — ABNORMAL HIGH (ref 26.0–34.0)
MCHC: 33.7 g/dL (ref 30.0–36.0)
MCV: 103.9 fL — ABNORMAL HIGH (ref 80.0–100.0)
MONO ABS: 0.4 10*3/uL (ref 0.1–1.0)
MONOS PCT: 12 %
NEUTROS ABS: 1.9 10*3/uL (ref 1.7–7.7)
Neutrophils Relative %: 64 %
Platelets: 78 10*3/uL — ABNORMAL LOW (ref 150–400)
RBC: 3.37 MIL/uL — ABNORMAL LOW (ref 3.87–5.11)
RDW: 14 % (ref 11.5–15.5)
WBC: 3 10*3/uL — AB (ref 4.0–10.5)
nRBC: 0 % (ref 0.0–0.2)

## 2018-02-05 LAB — COMPREHENSIVE METABOLIC PANEL
ALT: 10 U/L (ref 0–44)
AST: 19 U/L (ref 15–41)
Albumin: 3.5 g/dL (ref 3.5–5.0)
Alkaline Phosphatase: 47 U/L (ref 38–126)
Anion gap: 7 (ref 5–15)
BILIRUBIN TOTAL: 0.4 mg/dL (ref 0.3–1.2)
BUN: 10 mg/dL (ref 6–20)
CO2: 25 mmol/L (ref 22–32)
Calcium: 8.3 mg/dL — ABNORMAL LOW (ref 8.9–10.3)
Chloride: 108 mmol/L (ref 98–111)
Creatinine, Ser: 0.69 mg/dL (ref 0.44–1.00)
GFR calc Af Amer: >60 mL/min (ref >60)
Glucose, Bld: 83 mg/dL (ref 70–99)
POTASSIUM: 3.8 mmol/L (ref 3.5–5.1)
Sodium: 140 mmol/L (ref 135–145)
Total Protein: 5.5 g/dL — ABNORMAL LOW (ref 6.5–8.1)

## 2018-02-05 MED ORDER — PROCHLORPERAZINE MALEATE 10 MG PO TABS
10.0000 mg | ORAL_TABLET | Freq: Once | ORAL | Status: AC
  Administered 2018-02-05: 10 mg via ORAL

## 2018-02-05 MED ORDER — ACETAMINOPHEN 325 MG PO TABS
650.0000 mg | ORAL_TABLET | Freq: Once | ORAL | Status: AC
  Administered 2018-02-05: 650 mg via ORAL

## 2018-02-05 MED ORDER — ACETAMINOPHEN 325 MG PO TABS
ORAL_TABLET | ORAL | Status: AC
  Filled 2018-02-05: qty 2

## 2018-02-05 MED ORDER — PROCHLORPERAZINE MALEATE 10 MG PO TABS
ORAL_TABLET | ORAL | Status: AC
  Filled 2018-02-05: qty 1

## 2018-02-05 MED ORDER — SODIUM CHLORIDE 0.9 % IV SOLN
15.2000 mg/kg | Freq: Once | INTRAVENOUS | Status: AC
  Administered 2018-02-05: 900 mg via INTRAVENOUS
  Filled 2018-02-05: qty 40

## 2018-02-05 MED ORDER — DIPHENHYDRAMINE HCL 25 MG PO CAPS
ORAL_CAPSULE | ORAL | Status: AC
  Filled 2018-02-05: qty 2

## 2018-02-05 MED ORDER — SODIUM CHLORIDE 0.9 % IV SOLN
20.0000 mg | Freq: Once | INTRAVENOUS | Status: AC
  Administered 2018-02-05: 20 mg via INTRAVENOUS
  Filled 2018-02-05: qty 2

## 2018-02-05 MED ORDER — SODIUM CHLORIDE 0.9% FLUSH
10.0000 mL | INTRAVENOUS | Status: DC | PRN
  Administered 2018-02-05: 10 mL
  Filled 2018-02-05: qty 10

## 2018-02-05 MED ORDER — HEPARIN SOD (PORK) LOCK FLUSH 100 UNIT/ML IV SOLN
500.0000 [IU] | Freq: Once | INTRAVENOUS | Status: AC | PRN
  Administered 2018-02-05: 500 [IU]
  Filled 2018-02-05: qty 5

## 2018-02-05 MED ORDER — DIPHENHYDRAMINE HCL 25 MG PO CAPS
50.0000 mg | ORAL_CAPSULE | Freq: Once | ORAL | Status: AC
  Administered 2018-02-05: 50 mg via ORAL

## 2018-02-05 MED ORDER — SODIUM CHLORIDE 0.9 % IV SOLN
Freq: Once | INTRAVENOUS | Status: AC
  Administered 2018-02-05: 09:00:00 via INTRAVENOUS
  Filled 2018-02-05: qty 250

## 2018-02-05 NOTE — Assessment & Plan Note (Signed)
This is a pleasant 57 year old Caucasian female with multiple myeloma.  She is currently on treatment with daratumumab and Pomalyst and tolerating this well overall with the exception of mild fatigue and pancytopenia.  She has had stabilization and her serum light chains.  Labs from today have been reviewed and are adequate to proceed with daratumumab today as scheduled.  She was instructed to complete the remaining week of Pomalyst and then take 1 week break. Continue prophylaxis with valacyclovir.  Continue calcium and vitamin D. She will receive Zometa every 6 months next dose is due in December.  Follow-up visit will be in 4 weeks for evaluation prior to her next cycle of chemotherapy.

## 2018-02-05 NOTE — Patient Instructions (Signed)
[facility] Discharge Instructions for Patients Receiving Chemotherapy  Today you received the following chemotherapy agents: daratumumab (Darzalex).  To help prevent nausea and vomiting after your treatment, we encourage you to take your nausea medication as directed.   If you develop nausea and vomiting that is not controlled by your nausea medication, call the clinic.   BELOW ARE SYMPTOMS THAT SHOULD BE REPORTED IMMEDIATELY:  *FEVER GREATER THAN 100.5 F  *CHILLS WITH OR WITHOUT FEVER  NAUSEA AND VOMITING THAT IS NOT CONTROLLED WITH YOUR NAUSEA MEDICATION  *UNUSUAL SHORTNESS OF BREATH  *UNUSUAL BRUISING OR BLEEDING  TENDERNESS IN MOUTH AND THROAT WITH OR WITHOUT PRESENCE OF ULCERS  *URINARY PROBLEMS  *BOWEL PROBLEMS  UNUSUAL RASH Items with * indicate a potential emergency and should be followed up as soon as possible.  Feel free to call the clinic should you have any questions or concerns. The clinic phone number is [phone].  Please show the CHEMO ALERT CARD at check-in to the Emergency Department and triage nurse.   

## 2018-02-05 NOTE — Progress Notes (Signed)
[place] OFFICE PROGRESS NOTE  [name], MD [address] [place] [address]    Multiple myeloma in relapse [facility])   07/21/2007 Bone Marrow Biopsy    Case #: [id number] Bone marrow showed myeloma    07/21/2007 Miscellaneous    She was diagnosed in May 2009. Had several cycles of RVD    10/14/2007 Bone Marrow Biopsy    Case #: [id number] repeat bone marrow biopsy showed good response to Rx    12/31/2007 Bone Marrow Transplant    She had autologous BMT at [facility] [place]    07/19/2009 Bone Marrow Biopsy    Case #: [id number] Bone marrow biopsy showed only 1% plasma cells    10/10/2010 Bone Marrow Biopsy    [id number] Bone marrow biopsy showed 2% plasma cells    09/25/2011 Bone Marrow Biopsy    Accession #: [id number] Bone marrow only showed 4% plasma cells with ligh chain excess    10/14/2011 - 06/13/2013 Chemotherapy    She received Revlimid only, discontinued due to pancytopenia    06/18/2015 - 09/13/2015 Chemotherapy    She resumed taking Revlimid and dexamethasone. Treatment is stopped due to minimum response and pancytopenia    10/19/2015 -  Chemotherapy    The patient had Velcade for chemotherapy treatment along with weekly Daratumumab. Last dose of Velcade on 02/29/16, stopped due to progression. Pomalidomide added on 03/14/16    04/11/2016 Adverse Reaction    Delay resumption of Pomalyst cycle 2 until 04/16/16 due to neutropenia    09/16/2016 PET scan    1. No findings of active bony lymphoma. Prior activity in the sternum and thoracic spine has resolved. 2. Focal hypermetabolic activity along the sigmoid colon with some local inflammatory stranding in the adjacent mesentery, maximum SUV 11.0. This is probably from mild acute diverticulitis. There is no hypermetabolic activity in this vicinity 4 months ago to suggest that this is from colon cancer. Correlate with any symptoms. 3. Acute right maxillary sinusitis. 4. Thoracolumbar compression  fractures, vertebral augmentation at the L1 level.    09/17/2016 Procedure    CT guided bone marrow biopsy of right posterior iliac bone with both aspirate and core samples obtained.    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 - TRILINEAGE HEMATOPOIESIS. - SEE COMMENT. PERIPHERAL BLOOD: - LYMPHOPENIA. - THROMBOCYTOPENIA. Diagnosis Note The bone marrow is slightly hypocellular for age with trilineage hematopoiesis and non-specific myeloid changes likely related to previous therapy. Plasma cells are increased in number representing 8% of all cells in the aspirate, but with lack of large aggregates or sheets in the clot and biopsy sections. Immunohistochemical stains highlight the slightly increased plasma cell component in the marrow which shows kappa light chain restriction consistent with residual/recurrent plasma cell neoplasm. Correlation with cytogenetic and FISH studies recommended    09/17/2016 Pathology Results    Normal bone marrow cytogenetics and FISH    CURRENT THERAPY: Daratumumab 15 mg/kg and Pomalyst 3 mg daily taken for 21 days on 7 days off.  INTERVAL HISTORY: Jessica Cochran 57 y.o. female returns for routine follow-up visit accompanied by her husband.  The patient is seen in infusion today.  Reports having intermittent sinus drainage which is clear in color.  She is afebrile.  Denies chills.  Denies chest pain, shortness breath, cough, hemoptysis.  Denies nausea, vomiting, constipation, diarrhea.  Denies recent weight  loss or night sweats.  Denies bleeding.  Pain controlled with OxyContin 60 mg twice a day.  Uses breakthrough oxycodone infrequently.  The patient is here for evaluation prior to her next dose of daratumumab.  MEDICAL HISTORY: Past Medical History:  Diagnosis Date  . Arthritis   . Cough 06/09/2014  . Dysuria 06/09/2014  . Herpes zoster  infection 04/29/2011  . Multiple myeloma in relapse ([place]) 10/12/2011   08/19/11: increase in kappa serum free light chains; 7/11: elevation urine protein (157 mg/24 hr) and reappearance monoclonal kappa light chains; 7/18: bone marrow biopsy 4% plasma cells; bone X-rays: no new lesions  . Multiple myeloma in remission ([place]) 04/29/2011  . Pelvic mass in female 08/28/2014  . Renal stone   . Yeast infection 06/08/2015    ALLERGIES:  is allergic to hydromorphone.  MEDICATIONS:  Current Outpatient Medications  Medication Sig Dispense Refill  . aspirin 81 MG tablet Take 81 mg by mouth.    . cholecalciferol (VITAMIN D) 1000 UNITS tablet Take 1,000 Units by mouth daily.    . Multiple Vitamins-Minerals (ONE-A-DAY 50 PLUS PO) Take by mouth daily.    Marland Kitchen oxycodone (OXY-IR) 5 MG capsule Take 5 mg by mouth every 4 (four) hours as needed. pain    . Oxycodone HCl (OXYCONTIN) 60 MG TB12 Take 1 tablet by mouth 2 (two) times daily.    . pantoprazole (PROTONIX) 40 MG tablet TAKE (1) TABLET DAILY AS NEEDED. 30 tablet 0  . POMALYST 3 MG capsule TAKE 1 CAPSULE BY MOUTH ONCE DAILY DAYS 1-21 EVERY 28 DAYS 21 capsule 11  . valACYclovir (VALTREX) 1000 MG tablet Take 1 tablet (1,000 mg total) by mouth daily. 90 tablet 11   No current facility-administered medications for this visit.     SURGICAL HISTORY:  Past Surgical History:  Procedure Laterality Date  . BACK SURGERY     2009  . PILONIDAL CYST DRAINAGE  1991  . rectal abcess    . stem cell transplant autologus    . uterine fibroid tumor removed     1990's    REVIEW OF SYSTEMS:   Review of Systems  Constitutional: Negative for appetite change, chills, fatigue, fever and unexpected weight change.  HENT:   Negative for mouth sores, nosebleeds, sore throat and trouble swallowing.  Positive for sinus drainage. Eyes: Negative for eye problems and icterus.  Respiratory: Negative for cough, hemoptysis, shortness of breath and wheezing.   Cardiovascular: Negative  for chest pain and leg swelling.  Gastrointestinal: Negative for abdominal pain, constipation, diarrhea, nausea and vomiting.  Genitourinary: Negative for bladder incontinence, difficulty urinating, dysuria, frequency and hematuria.   Musculoskeletal: Negative for back pain, gait problem, neck pain and neck stiffness.  Skin: Negative for itching and rash.  Neurological: Negative for dizziness, extremity weakness, gait problem, headaches, light-headedness and seizures.  Hematological: Negative for adenopathy. Does not bruise/bleed easily.  Psychiatric/Behavioral: Negative for confusion, depression and sleep disturbance. The patient is not nervous/anxious.     PHYSICAL EXAMINATION:  Temperature 97.9, pulse 75, blood pressure 137/87, respirations 18, O2 sat 100% on room air.  ECOG PERFORMANCE STATUS: 1 - Symptomatic but completely ambulatory  Physical Exam  Constitutional: Oriented to person, place, and time and well-developed, well-nourished, and in no distress. No distress.  HENT:  Head: Normocephalic and atraumatic.  Mouth/Throat: Oropharynx is clear and moist. No oropharyngeal exudate.  Eyes: Conjunctivae are normal. Right eye exhibits no discharge. Left eye exhibits no discharge. No scleral icterus.  Neck: Normal range of motion.  Neck supple.  Cardiovascular: Normal rate, regular rhythm, normal heart sounds and intact distal pulses.   Pulmonary/Chest: Effort normal and breath sounds normal. No respiratory distress. No wheezes. No rales.  Abdominal: Soft. Bowel sounds are normal. Exhibits no distension and no mass. There is no tenderness.  Musculoskeletal: Normal range of motion. Exhibits no edema.  Lymphadenopathy:    No cervical adenopathy.  Neurological: Alert and oriented to person, place, and time. Exhibits normal muscle tone. Gait normal. Coordination normal.  Skin: Skin is warm and dry. No rash noted. Not diaphoretic. No erythema. No pallor.  Psychiatric: Mood, memory and  judgment normal.  Vitals reviewed.  LABORATORY DATA: Lab Results  Component Value Date   WBC 3.0 (L) 02/05/2018   HGB 11.8 (L) 02/05/2018   HCT 35.0 (L) 02/05/2018   MCV 103.9 (H) 02/05/2018   PLT 78 (L) 02/05/2018      Chemistry      Component Value Date/Time   NA 140 02/05/2018 0732   NA 140 03/11/2017 1150   K 3.8 02/05/2018 0732   K 3.9 03/11/2017 1150   CL 108 02/05/2018 0732   CL 107 08/30/2012 1055   CO2 25 02/05/2018 0732   CO2 29 03/11/2017 1150   BUN 10 02/05/2018 0732   BUN 12.5 03/11/2017 1150   CREATININE 0.69 02/05/2018 0732   CREATININE 0.8 03/11/2017 1150      Component Value Date/Time   CALCIUM 8.3 (L) 02/05/2018 0732   CALCIUM 8.7 03/11/2017 1150   ALKPHOS 47 02/05/2018 0732   ALKPHOS 45 03/11/2017 1150   AST 19 02/05/2018 0732   AST 18 03/11/2017 1150   ALT 10 02/05/2018 0732   ALT 14 03/11/2017 1150   BILITOT 0.4 02/05/2018 0732   BILITOT 0.37 03/11/2017 1150       RADIOGRAPHIC STUDIES:  No results found.   ASSESSMENT/PLAN:  Multiple myeloma in relapse [facility]) This is a pleasant 57 year old Caucasian female with multiple myeloma.  She is currently on treatment with daratumumab and Pomalyst and tolerating this well overall with the exception of mild fatigue and pancytopenia.  She has had stabilization and her serum light chains.  Labs from today have been reviewed and are adequate to proceed with daratumumab today as scheduled.  She was instructed to complete the remaining week of Pomalyst and then take 1 week break. Continue prophylaxis with valacyclovir.  Continue calcium and vitamin D. She will receive Zometa every 6 months next dose is due in December.  Follow-up visit will be in 4 weeks for evaluation prior to her next cycle of chemotherapy.  Pancytopenia due to antineoplastic chemotherapy ([place]) Counts improved after holding Pomalyst for 2 weeks and then resuming.  She was instructed to monitor her temperature and call us if she develops any  fevers.   No orders of the defined types were placed in this encounter.    Mikey Bussing, DNP, AGPCNP-BC, AOCNP 02/05/18

## 2018-02-05 NOTE — Assessment & Plan Note (Signed)
Counts improved after holding Pomalyst for 2 weeks and then resuming.  She was instructed to monitor her temperature and call us if she develops any fevers.

## 2018-02-08 ENCOUNTER — Telehealth: Payer: Self-pay | Admitting: *Deleted

## 2018-02-08 ENCOUNTER — Other Ambulatory Visit: Payer: Self-pay | Admitting: Hematology and Oncology

## 2018-02-08 LAB — KAPPA/LAMBDA LIGHT CHAINS
KAPPA FREE LGHT CHN: 105.4 mg/L — AB (ref 3.3–19.4)
Kappa, lambda light chain ratio: 27.74 — ABNORMAL HIGH (ref 0.26–1.65)
Lambda free light chains: 3.8 mg/L — ABNORMAL LOW (ref 5.7–26.3)

## 2018-02-08 NOTE — Telephone Encounter (Signed)
Patient called into Dr Calton Dach nurse line and requested to relay a message to Mikey Bussing, NP.  She states that she was seen Friday by Myrtha Mantis, NP prior to her treatment and mentioned to NP that she had some yellow nasal drainage that she suspected to be a sinus infection and was told to call if things got any worse.  Per Erasmo Downer and her notes she states patient did complain of nasal drainage and lung sounds were clear at that time.  She does not feel comfortable prescribing anything over the phone and requested that the patient come into our [facility] to be assessed since patient is now saying that the nasal drainage has changed in color (green with pink tinge) and she feels has gotten to her lungs and stated that last year she had the same thing and required 2 Z packs to resolve.  Patient was upset with response to be seen by [facility] and hung up.

## 2018-02-09 LAB — MULTIPLE MYELOMA PANEL, SERUM
Albumin SerPl Elph-Mcnc: 3.3 g/dL (ref 2.9–4.4)
Albumin/Glob SerPl: 2 — ABNORMAL HIGH (ref 0.7–1.7)
Alpha 1: 0.2 g/dL (ref 0.0–0.4)
Alpha2 Glob SerPl Elph-Mcnc: 0.6 g/dL (ref 0.4–1.0)
B-Globulin SerPl Elph-Mcnc: 0.7 g/dL (ref 0.7–1.3)
GAMMA GLOB SERPL ELPH-MCNC: 0.2 g/dL — AB (ref 0.4–1.8)
GLOBULIN, TOTAL: 1.7 g/dL — AB (ref 2.2–3.9)
IGA: 10 mg/dL — AB (ref 87–352)
IGM (IMMUNOGLOBULIN M), SRM: 10 mg/dL — AB (ref 26–217)
IgG (Immunoglobin G), Serum: 272 mg/dL — ABNORMAL LOW (ref 700–1600)
M Protein SerPl Elph-Mcnc: 0.1 g/dL — ABNORMAL HIGH
Total Protein ELP: 5 g/dL — ABNORMAL LOW (ref 6.0–8.5)

## 2018-02-09 NOTE — Telephone Encounter (Signed)
Pls refill electronically °

## 2018-02-10 ENCOUNTER — Telehealth: Payer: Self-pay

## 2018-02-10 NOTE — Telephone Encounter (Signed)
Called and left a message asking [name] to call the office back.

## 2018-02-10 NOTE — Telephone Encounter (Signed)
Called cell number and spoke with husband. Given below message. He verbalized understanding. He will ask Diane and see what she wants to do and will call back.  He wanted Dr. Alvy Bimler to know that Nicanor Alcon was off [place] for 2 weeks in November due blood work. Diane has had a cold and got treatment last week. She coughed up some blood over the weekend. Denies fever and is starting to feel better. Offered appt with Sandi Mealy, PA. [name] refused offer of a appt. Message given to Dr. Alvy Bimler.  Will wait on them to call back with answer.

## 2018-02-10 NOTE — Telephone Encounter (Signed)
-----   Message from [name], MD sent at 02/10/2018  8:59 AM EST ----- Regarding: light chains a bit high Let her know light chains a bit high Not sure if related to fluctuation I recommend repeat Does she want to come in this week for repeat or wait until after X- mas as scheduled?

## 2018-02-11 ENCOUNTER — Telehealth: Payer: Self-pay

## 2018-02-11 ENCOUNTER — Other Ambulatory Visit: Payer: Self-pay | Admitting: Medical

## 2018-02-11 ENCOUNTER — Ambulatory Visit (HOSPITAL_COMMUNITY)
Admission: RE | Admit: 2018-02-11 | Discharge: 2018-02-11 | Disposition: A | Discharge status: Home / Self Care | Payer: 59 | Source: Ambulatory Visit | Attending: Medical | Admitting: Medical

## 2018-02-11 ENCOUNTER — Inpatient Hospital Stay: Payer: 59 | Admitting: Medical

## 2018-02-11 ENCOUNTER — Inpatient Hospital Stay: Payer: 59 | Attending: Hematology and Oncology | Admitting: Medical

## 2018-02-11 ENCOUNTER — Other Ambulatory Visit: Payer: Self-pay

## 2018-02-11 VITALS — BP 127/90 | HR 92 | Temp 99.2°F | Resp 16 | Ht 59.0 in | Wt 121.7 lb

## 2018-02-11 DIAGNOSIS — Z5111 Encounter for antineoplastic chemotherapy: Secondary | ICD-10-CM | POA: Insufficient documentation

## 2018-02-11 DIAGNOSIS — R05 Cough: Secondary | ICD-10-CM | POA: Insufficient documentation

## 2018-02-11 DIAGNOSIS — C9002 Multiple myeloma in relapse: Secondary | ICD-10-CM

## 2018-02-11 DIAGNOSIS — Z79899 Other long term (current) drug therapy: Secondary | ICD-10-CM | POA: Diagnosis not present

## 2018-02-11 DIAGNOSIS — Z95828 Presence of other vascular implants and grafts: Secondary | ICD-10-CM

## 2018-02-11 DIAGNOSIS — D61810 Antineoplastic chemotherapy induced pancytopenia: Secondary | ICD-10-CM | POA: Diagnosis not present

## 2018-02-11 DIAGNOSIS — J189 Pneumonia, unspecified organism: Secondary | ICD-10-CM | POA: Insufficient documentation

## 2018-02-11 DIAGNOSIS — R042 Hemoptysis: Secondary | ICD-10-CM

## 2018-02-11 DIAGNOSIS — D801 Nonfamilial hypogammaglobulinemia: Secondary | ICD-10-CM | POA: Diagnosis not present

## 2018-02-11 DIAGNOSIS — R059 Cough, unspecified: Secondary | ICD-10-CM

## 2018-02-11 DIAGNOSIS — L905 Scar conditions and fibrosis of skin: Secondary | ICD-10-CM | POA: Diagnosis not present

## 2018-02-11 DIAGNOSIS — M40204 Unspecified kyphosis, thoracic region: Secondary | ICD-10-CM | POA: Diagnosis not present

## 2018-02-11 DIAGNOSIS — M8588 Other specified disorders of bone density and structure, other site: Secondary | ICD-10-CM | POA: Insufficient documentation

## 2018-02-11 DIAGNOSIS — J181 Lobar pneumonia, unspecified organism: Secondary | ICD-10-CM

## 2018-02-11 LAB — CMP (CANCER CENTER ONLY)
ALT: 13 U/L (ref 0–44)
ANION GAP: 9 (ref 5–15)
AST: 18 U/L (ref 15–41)
Albumin: 4 g/dL (ref 3.5–5.0)
Alkaline Phosphatase: 56 U/L (ref 38–126)
BUN: 11 mg/dL (ref 6–20)
CO2: 29 mmol/L (ref 22–32)
Calcium: 9.3 mg/dL (ref 8.9–10.3)
Chloride: 104 mmol/L (ref 98–111)
Creatinine: 0.82 mg/dL (ref 0.44–1.00)
GFR, Est AFR Am: >60 mL/min (ref >60)
GFR, Estimated: >60 mL/min (ref >60)
Glucose, Bld: 95 mg/dL (ref 70–99)
Potassium: 4 mmol/L (ref 3.5–5.1)
Sodium: 142 mmol/L (ref 135–145)
Total Bilirubin: 0.7 mg/dL (ref 0.3–1.2)
Total Protein: 6.6 g/dL (ref 6.5–8.1)

## 2018-02-11 LAB — CBC WITH DIFFERENTIAL (CANCER CENTER ONLY)
Abs Immature Granulocytes: 0.01 10*3/uL (ref 0.00–0.07)
Basophils Absolute: 0 10*3/uL (ref 0.0–0.1)
Basophils Relative: 1 %
Eosinophils Absolute: 0.6 10*3/uL — ABNORMAL HIGH (ref 0.0–0.5)
Eosinophils Relative: 27 %
HCT: 40.6 % (ref 36.0–46.0)
Hemoglobin: 13.5 g/dL (ref 12.0–15.0)
IMMATURE GRANULOCYTES: 0 %
Lymphocytes Relative: 23 %
Lymphs Abs: 0.6 10*3/uL — ABNORMAL LOW (ref 0.7–4.0)
MCH: 34.4 pg — ABNORMAL HIGH (ref 26.0–34.0)
MCHC: 33.3 g/dL (ref 30.0–36.0)
MCV: 103.3 fL — ABNORMAL HIGH (ref 80.0–100.0)
Monocytes Absolute: 0.4 10*3/uL (ref 0.1–1.0)
Monocytes Relative: 17 %
NEUTROS ABS: 0.8 10*3/uL — AB (ref 1.7–7.7)
NEUTROS PCT: 32 %
Platelet Count: 96 10*3/uL — ABNORMAL LOW (ref 150–400)
RBC: 3.93 MIL/uL (ref 3.87–5.11)
RDW: 13.5 % (ref 11.5–15.5)
WBC Count: 2.4 10*3/uL — ABNORMAL LOW (ref 4.0–10.5)
nRBC: 0 % (ref 0.0–0.2)

## 2018-02-11 MED ORDER — SODIUM CHLORIDE 0.9 % IV SOLN
INTRAVENOUS | Status: DC
  Administered 2018-02-11: 15:00:00 via INTRAVENOUS
  Filled 2018-02-11: qty 250

## 2018-02-11 MED ORDER — ALBUTEROL SULFATE HFA 108 (90 BASE) MCG/ACT IN AERS: 2.0000 | INHALATION_SPRAY | RESPIRATORY_TRACT | 2 refills | Status: DC | PRN

## 2018-02-11 MED ORDER — HYDROCOD POLST-CPM POLST ER 10-8 MG/5ML PO SUER: 5.0000 mL | Freq: Two times a day (BID) | ORAL | 0 refills | Status: DC | PRN

## 2018-02-11 MED ORDER — LEVOFLOXACIN 500 MG PO TABS: 500.0000 mg | ORAL_TABLET | Freq: Every day | ORAL | 0 refills | Status: DC

## 2018-02-11 MED ORDER — SODIUM CHLORIDE 0.9% FLUSH
10.0000 mL | INTRAVENOUS | Status: DC | PRN
  Administered 2018-02-11: 10 mL via INTRAVENOUS
  Filled 2018-02-11: qty 10

## 2018-02-11 MED ORDER — LEVOFLOXACIN IN D5W 500 MG/100ML IV SOLN
500.0000 mg | Freq: Once | INTRAVENOUS | Status: AC
  Administered 2018-02-11: 500 mg via INTRAVENOUS
  Filled 2018-02-11: qty 100

## 2018-02-11 MED ORDER — HEPARIN SOD (PORK) LOCK FLUSH 100 UNIT/ML IV SOLN
500.0000 [IU] | Freq: Once | INTRAVENOUS | Status: AC
  Administered 2018-02-11: 500 [IU] via INTRAVENOUS
  Filled 2018-02-11: qty 5

## 2018-02-11 MED ORDER — PREDNISONE 5 MG PO TABS: ORAL_TABLET | ORAL | 0 refills | Status: DC

## 2018-02-11 NOTE — Telephone Encounter (Signed)
Printed avs and calender of upcoming appointment. Per 12/5 los 

## 2018-02-11 NOTE — Telephone Encounter (Signed)
Late entry today for 02-10-2018 at 1630.  "Makaylyn Sinyard calling for my wife Jessica Cochran, patient of Dr. Alvy Bimler.  Spoke with nurse earlier.  Benjamine Mola and I discussed her being seen by a doctor tomorrow.  She has been sick the last several days, coughing for a week, has myeloma, trouble with exertion feeling bad trying to load the dishwasher.  Just left a message but would like nurse paged to talk further."   This nurse left voicemail on collaborative line with call information.  Carl "I'll wait for return call".   Communicated examples of distress with instructions to proceed to ER immediately if Jessica Cochran demonstrates severe  changes, respiratory distress, changes in breathing rate, effort, color changes to mouth, skin, perspirations, fever greater than or equal to 100.46F and chills.  Denies questions or further needs at this time.

## 2018-02-11 NOTE — Patient Instructions (Signed)
Levofloxacin injection What is this medicine? LEVOFLOXACIN (lee voe FLOX a sin) is a quinolone antibiotic. It is used to treat certain kinds of bacterial infections. It will not work for colds, flu, or other viral infections. This medicine may be used for other purposes; ask your health care provider or pharmacist if you have questions. COMMON BRAND NAME(S): Levaquin What should I tell my health care provider before I take this medicine? They need to know if you have any of these conditions: -bone problems -diabetes -history of low levels of potassium in the blood -irregular heartbeat -joint problems -kidney disease -liver disease -myasthenia gravis -seizures -tendon problems -tingling of the fingers or toes, or other nerve disorder -an unusual or allergic reaction to levofloxacin, other quinolone antibiotics, foods, dyes, or preservatives -pregnant or trying to get pregnant -breast-feeding How should I use this medicine? This medicine is for infusion into a vein. It is usually given by a health care professional in a hospital or clinic setting. If you get this medicine at home, you will be taught how to prepare and give this medicine. Use exactly as directed. Take your medicine at regular intervals. Do not take your medicine more often than directed. It is important that you put your used needles and syringes in a special sharps container. Do not put them in a trash can. If you do not have a sharps container, call your pharmacist or healthcare provider to get one. A special MedGuide will be given to you by the pharmacist with each prescription and refill. Be sure to read this information carefully each time. Talk to your pediatrician regarding the use of this medicine in children. While this drug may be prescribed for children as young as 6 months for selected conditions, precautions do apply. Overdosage: If you think you have taken too much of this medicine contact a poison control  center or emergency room at once. NOTE: This medicine is only for you. Do not share this medicine with others. What if I miss a dose? If you miss a dose, use it as soon as you can. If it is almost time for your next dose, use only that dose. Do not use double or extra doses. What may interact with this medicine? Do not take this medicine with any of the following medications: -bepridil -certain medicines for depression, anxiety, or psychotic disturbances like pimozide, thioridazine, and ziprasidone -certain medicines for irregular heart beat like dofetilide and dronedarone -cisapride -halofantrine This medicine may also interact with the following medications: -birth control pills -certain medicines for diabetes, like glipizide, glyburide, or insulin -NSAIDS, medicines for pain and inflammation, like ibuprofen or naproxen -steroid medicines like prednisone or cortisone -theophylline -warfarin This list may not describe all possible interactions. Give your health care provider a list of all the medicines, herbs, non-prescription drugs, or dietary supplements you use. Also tell them if you smoke, drink alcohol, or use illegal drugs. Some items may interact with your medicine. What should I watch for while using this medicine? Tell your doctor or healthcare professional if your symptoms do not start to get better or if they get worse. Do not treat diarrhea with over the counter products. Contact your doctor if you have diarrhea that lasts more than 2 days or if it is severe and watery. Check with your doctor or health care professional if you get an attack of severe diarrhea, nausea and vomiting, or if you sweat a lot. The loss of too much body fluid can make it dangerous   for you to take this medicine. You may get drowsy or dizzy. Do not drive, use machinery, or do anything that needs mental alertness until you know how this medicine affects you. Do not sit or stand up quickly, especially if you  are an older patient. This reduces the risk of dizzy or fainting spells. This medicine can make you more sensitive to the sun. Keep out of the sun. If you cannot avoid being in the sun, wear protective clothing and use a sunscreen. Do not use sun lamps or tanning beds/booths. Contact your doctor if you get a sunburn. If you are a diabetic monitor your blood glucose carefully. If you get an unusual reading stop taking this medicine and call your doctor right away. What side effects may I notice from receiving this medicine? Side effects that you should report to your doctor or health care professional as soon as possible: -allergic reactions like skin rash or hives, swelling of the face, lips, or tongue -anxious -breathing problems -confusion -depressed mood -diarrhea -dizziness -fast, irregular heartbeat -hallucination, loss of contact with reality -joint, muscle, or tendon pain or swelling -muscle weakness -pain, tingling, numbness in the hands or feet -seizures -signs and symptoms of high blood sugar such as dizziness; dry mouth; dry skin; fruity breath; nausea; stomach pain; increased hunger or thirst; increased urination -signs and symptoms of liver injury like dark yellow or brown urine; general ill feeling or flu-like symptoms; light-colored stools; loss of appetite; nausea; right upper belly pain; unusually weak or tired; yellowing of the eyes or skin -signs and symptoms of low blood sugar such as feeling anxious; confusion; dizziness; increased hunger; unusually weak or tired; sweating; shakiness; cold; irritable; headache; blurred vision; fast heartbeat; loss of consciousness -suicidal thoughts or other mood changes -sunburn -unusually weak or tired Side effects that usually do not require medical attention (report to your doctor or health care professional if they continue or are bothersome): -constipation -dry mouth -headache -nausea, vomiting -pain, irritation at the site of  injection -trouble sleeping This list may not describe all possible side effects. Call your doctor for medical advice about side effects. You may report side effects to FDA at 1-800-FDA-1088. Where should I keep my medicine? Keep out of the reach of children. If you are using this medicine at home, you will be instructed on how to store this medicine. Throw away any unused medicine after the expiration date on the label. NOTE: This sheet is a summary. It may not cover all possible information. If you have questions about this medicine, talk to your doctor, pharmacist, or health care provider.  2018 Elsevier/Gold Standard (2015-09-04 12:36:58)  

## 2018-02-11 NOTE — Telephone Encounter (Signed)
Called back and spoke with husband. Received message from after hours service from yesterday. She would like appt today, offered appt with Jessica Mealy, PA. They would like to come today at 1 pm. Jessica Cochran has a cough and has coughed up some blood. She feels congested and heart rate is higher than normal.  Appt scheduled with Jessica Mealy, PA for 1 pm today, with labs prior.

## 2018-02-11 NOTE — Progress Notes (Signed)
[facility] Progress Note   Jessica Cochran [id number] October 23, 1960 57 y.o.  Jessica Cochran is managed by Dr. [name]  Actively treated with chemotherapy/immunotherapy/hormonal therapy: yes  Current Therapy: daratumumab and Pomalyst  Last Treated: 02/05/2018 (Cycle 19, Day 1)  Assessment: Plan:    Pneumonia of left lower lobe due to infectious organism ([place]) - Plan: levofloxacin (LEVAQUIN) IVPB 500 mg, 0.9 %  sodium chloride infusion, levofloxacin (LEVAQUIN) 500 MG tablet, albuterol (PROVENTIL HFA;VENTOLIN HFA) 108 (90 Base) MCG/ACT inhaler, predniSONE (DELTASONE) 5 MG tablet  Port-A-Cath in place - Plan: heparin lock flush 100 unit/mL, sodium chloride flush (NS) 0.9 % injection 10 mL  Multiple myeloma in relapse [facility])   Relapsed multiple myeloma: Jessica Cochran continues on daratumumab and Pomalyst and was last treated on 02/05/2018. She will return to see Dr. Alvy Cochran for labs and follow up on 02/22/2018 at 11:15 AM.  Pneumonia of the left lower lobe: Jessica Cochran was given Levaquin 500 mg IV x 1 today. She was additionally given Levaquin 500 mg PO QDay x 9 days, a 7 day prednisone taper and an albuterol inhaler. A CXR returned showing:  More prominent markings medially at the left lung base may represent patchy pneumonia. Recommend follow-up chest x-ray. Stable linear scarring at the left lung base. Osteopenia and thoracic kyphosis with stable thoracic and lumbar vertebral compression deformities.  Please see After Visit Summary for patient specific instructions.  Future Appointments  Date Time Provider [place]  02/22/2018 11:15 AM [name], MD [facility] None  03/05/2018  7:30 AM [facility] INFUSION [facility] None  03/05/2018  7:45 AM [facility] LAB 4 [facility] None  03/05/2018  8:30 AM [name], MD [facility] None  04/02/2018  7:30 AM [facility] INFUSION [facility] None    No orders of the defined types were placed in  this encounter.     Subjective:   Patient ID:  Jessica Cochran is a 57 y.o. (DOB 07-27-1960) female.  Chief Complaint:  Chief Complaint  Patient presents with  . Cough  . Fatigue  . Nausea    HPI Jessica Cochran  is a 57 year old female with with a relapsed multiple myeloma.  She is managed by Dr. Alvy Cochran and is treated with daratumumab and Pomalyst.  She was last treated with daratumumab on 02/05/2018. She continues on prophylactic valacyclovir.  She continues on calcium and vitamin D. She is treated with Zometa every 6 months.  She was last seen on 02/05/2018 and was expected to be seen next in 4 weeks for her next cycle of chemotherapy. The patient's husband contacted our office yesterday with a report that the patient has a cough and has coughed up blood. She felt congested with a pulse that was higher than normal. She had a Z-pak at home and elected to begin this on Monday. She has 2 days of antibiotics left to take. She was referred for a CXR which returned showing: More prominent markings medially at the left lung base may represent patchy pneumonia. Recommend follow-up chest x-ray. Stable linear scarring at the left lung base. Osteopenia and thoracic kyphosis with stable thoracic and lumbar vertebral compression deformities.  Medications: I have reviewed the patient's current medications.  Allergies:  Allergies  Allergen Reactions  . Hydromorphone Nausea Only    Past Medical History:  Diagnosis Date  . Arthritis   . Cough 06/09/2014  . Dysuria 06/09/2014  . Herpes zoster infection 04/29/2011  . Multiple myeloma in relapse [facility]) 10/12/2011   08/19/11:  increase in kappa serum free light chains; 7/11: elevation urine protein (157 mg/24 hr) and reappearance monoclonal kappa light chains; 7/18: bone marrow biopsy 4% plasma cells; bone X-rays: no new lesions  . Multiple myeloma in remission ([place]) 04/29/2011  . Pelvic mass in female 08/28/2014  . Renal stone   . Yeast infection  06/08/2015    Past Surgical History:  Procedure Laterality Date  . BACK SURGERY     2009  . PILONIDAL CYST DRAINAGE  1991  . rectal abcess    . stem cell transplant autologus    . uterine fibroid tumor removed     1990's    Family History  Problem Relation Age of Onset  . Cancer Sister        skin cancer (melanoma)    Social History   Socioeconomic History  . Marital status: Married    Spouse name: Not on file  . Number of children: Not on file  . Years of education: Not on file  . Highest education level: Not on file  Occupational History  . Not on file  Social Needs  . Financial resource strain: Not on file  . Food insecurity:    Worry: Not on file    Inability: Not on file  . Transportation needs:    Medical: Not on file    Non-medical: Not on file  Tobacco Use  . Smoking status: Former Smoker    Packs/day: 0.25    Years: 5.00    Pack years: 1.25  . Smokeless tobacco: Never Used  Substance and Sexual Activity  . Alcohol use: No  . Drug use: No  . Sexual activity: Not on file  Lifestyle  . Physical activity:    Days per week: Not on file    Minutes per session: Not on file  . Stress: Not on file  Relationships  . Social connections:    Talks on phone: Not on file    Gets together: Not on file    Attends religious service: Not on file    Active member of club or organization: Not on file    Attends meetings of clubs or organizations: Not on file    Relationship status: Not on file  . Intimate partner violence:    Fear of current or ex partner: Not on file    Emotionally abused: Not on file    Physically abused: Not on file    Forced sexual activity: Not on file  Other Topics Concern  . Not on file  Social History Narrative  . Not on file    Past Medical History, Surgical history, Social history, and Family history were reviewed and updated as appropriate.   Please see review of systems for further details on the patient's review from today.    Review of Systems:  Review of Systems  Constitutional: Negative for chills, diaphoresis, fatigue and fever.  HENT: Positive for congestion and sinus pressure. Negative for postnasal drip, rhinorrhea and sore throat.   Respiratory: Positive for cough. Negative for shortness of breath and wheezing.        Hemoptysis  Cardiovascular: Negative for palpitations.  Genitourinary: Negative for difficulty urinating, dysuria and frequency.  Neurological: Negative for headaches.    Objective:   Physical Exam:  BP 127/90 (BP Location: Right Arm, Patient Position: Sitting) Comment: RN Samantha aware of BP.  Pulse 92   Temp 99.2 F (37.3 C) (Oral) Comment: RN Samantha aware of temp.  Resp 16   Ht 4'  11" (1.499 m)   Wt 121 lb 11.2 oz (55.2 kg)   SpO2 99%   BMI 24.58 kg/m  ECOG: 0  Physical Exam  Constitutional: No distress.  HENT:  Head: Normocephalic and atraumatic.  Right Ear: External ear normal.  Left Ear: External ear normal.  Mouth/Throat: Oropharynx is clear and moist. No oropharyngeal exudate.  Neck: Normal range of motion. Neck supple.  Cardiovascular: Normal rate, regular rhythm and normal heart sounds. Exam reveals no gallop and no friction rub.  No murmur heard. Pulmonary/Chest: Effort normal. No respiratory distress. She has no wheezes. She has no rales.  Decreased ascultation and percussion over the left lung base.  Musculoskeletal:  Moderate anterior thoracic kyphosis  Lymphadenopathy:    She has no cervical adenopathy.  Neurological: She is alert. Coordination normal.  Skin: Skin is warm and dry. No rash noted. She is not diaphoretic. No erythema.  Psychiatric: She has a normal mood and affect. Her behavior is normal. Judgment and thought content normal.    Lab Review:     Component Value Date/Time   NA 142 02/11/2018 1256   NA 140 03/11/2017 1150   K 4.0 02/11/2018 1256   K 3.9 03/11/2017 1150   CL 104 02/11/2018 1256   CL 107 08/30/2012 1055   CO2 29  02/11/2018 1256   CO2 29 03/11/2017 1150   GLUCOSE 95 02/11/2018 1256   GLUCOSE 93 03/11/2017 1150   GLUCOSE 102 (H) 08/30/2012 1055   BUN 11 02/11/2018 1256   BUN 12.5 03/11/2017 1150   CREATININE 0.82 02/11/2018 1256   CREATININE 0.8 03/11/2017 1150   CALCIUM 9.3 02/11/2018 1256   CALCIUM 8.7 03/11/2017 1150   PROT 6.6 02/11/2018 1256   PROT 6.0 (L) 03/11/2017 1150   ALBUMIN 4.0 02/11/2018 1256   ALBUMIN 3.8 03/11/2017 1150   AST 18 02/11/2018 1256   AST 18 03/11/2017 1150   ALT 13 02/11/2018 1256   ALT 14 03/11/2017 1150   ALKPHOS 56 02/11/2018 1256   ALKPHOS 45 03/11/2017 1150   BILITOT 0.7 02/11/2018 1256   BILITOT 0.37 03/11/2017 1150   GFRNONAA >60 02/11/2018 1256   GFRAA >60 02/11/2018 1256       Component Value Date/Time   WBC 2.4 (L) 02/11/2018 1256   WBC 3.0 (L) 02/05/2018 0732   RBC 3.93 02/11/2018 1256   HGB 13.5 02/11/2018 1256   HGB 12.2 03/11/2017 1150   HCT 40.6 02/11/2018 1256   HCT 38.3 03/11/2017 1150   PLT 96 (L) 02/11/2018 1256   PLT 194 03/11/2017 1150   MCV 103.3 (H) 02/11/2018 1256   MCV 102.7 (H) 03/11/2017 1150   MCH 34.4 (H) 02/11/2018 1256   MCHC 33.3 02/11/2018 1256   RDW 13.5 02/11/2018 1256   RDW 15.2 (H) 03/11/2017 1150   LYMPHSABS 0.6 (L) 02/11/2018 1256   LYMPHSABS 1.4 03/11/2017 1150   MONOABS 0.4 02/11/2018 1256   MONOABS 0.5 03/11/2017 1150   EOSABS 0.6 (H) 02/11/2018 1256   EOSABS 0.3 03/11/2017 1150   BASOSABS 0.0 02/11/2018 1256   BASOSABS 0.2 (H) 03/11/2017 1150   -------------------------------  Imaging from last 24 hours (if applicable):  Radiology interpretation: Dg Chest 2 View  Result Date: 02/11/2018 CLINICAL DATA:  Low-grade fever, cough, hemoptysis, congestion over the last 3 days, history of multiple myeloma EXAM: CHEST - 2 VIEW COMPARISON:  Chest x-ray of 03/11/2016 FINDINGS: There has been linear scarring present previously at the left lung base. However there are more prominent markings medially  at the  left lung base overlying the heart shadow suggestive of left lower lobe patchy pneumonia. The right lung is clear. Mediastinal and hilar contours are stable. Right-sided Port-A-Cath tip overlies the mid lower SVC and heart size is stable. There are thoracic and upper lumbar compression deformities present and the bones are osteopenic with thoracic kyphosis noted. IMPRESSION: 1. More prominent markings medially at the left lung base may represent patchy pneumonia. Recommend follow-up chest x-ray. 2. Stable linear scarring at the left lung base. 3. Osteopenia and thoracic kyphosis with stable thoracic and lumbar vertebral compression deformities. Electronically Signed   By: Ivar Drape M.D.   On: 02/11/2018 12:47        This case was discussed with Dr. Alvy Cochran. She expresses agreement with my management of this patient.

## 2018-02-11 NOTE — Telephone Encounter (Signed)
Called and ask her to go radiology for chest xray prior to appts today. She verbalized understanding.

## 2018-02-12 LAB — KAPPA/LAMBDA LIGHT CHAINS
KAPPA, LAMDA LIGHT CHAIN RATIO: 27.93 — AB (ref 0.26–1.65)
Kappa free light chain: 114.5 mg/L — ABNORMAL HIGH (ref 3.3–19.4)
Lambda free light chains: 4.1 mg/L — ABNORMAL LOW (ref 5.7–26.3)

## 2018-02-15 LAB — MULTIPLE MYELOMA PANEL, SERUM
ALPHA2 GLOB SERPL ELPH-MCNC: 0.8 g/dL (ref 0.4–1.0)
Albumin SerPl Elph-Mcnc: 3.6 g/dL (ref 2.9–4.4)
Albumin/Glob SerPl: 1.7 (ref 0.7–1.7)
Alpha 1: 0.3 g/dL (ref 0.0–0.4)
B-GLOBULIN SERPL ELPH-MCNC: 0.8 g/dL (ref 0.7–1.3)
GLOBULIN, TOTAL: 2.2 g/dL (ref 2.2–3.9)
Gamma Glob SerPl Elph-Mcnc: 0.3 g/dL — ABNORMAL LOW (ref 0.4–1.8)
IgA: 12 mg/dL — ABNORMAL LOW (ref 87–352)
IgG (Immunoglobin G), Serum: 348 mg/dL — ABNORMAL LOW (ref 700–1600)
IgM (Immunoglobulin M), Srm: 16 mg/dL — ABNORMAL LOW (ref 26–217)
M Protein SerPl Elph-Mcnc: 0.1 g/dL — ABNORMAL HIGH
Total Protein ELP: 5.8 g/dL — ABNORMAL LOW (ref 6.0–8.5)

## 2018-02-15 NOTE — Progress Notes (Signed)
These preliminary result these preliminary results were noted.  Awaiting final report.

## 2018-02-18 ENCOUNTER — Other Ambulatory Visit: Payer: Self-pay | Admitting: Hematology and Oncology

## 2018-02-18 DIAGNOSIS — C9002 Multiple myeloma in relapse: Secondary | ICD-10-CM

## 2018-02-22 ENCOUNTER — Inpatient Hospital Stay (HOSPITAL_BASED_OUTPATIENT_CLINIC_OR_DEPARTMENT_OTHER): Payer: 59 | Admitting: Hematology and Oncology

## 2018-02-22 DIAGNOSIS — Z79899 Other long term (current) drug therapy: Secondary | ICD-10-CM

## 2018-02-22 DIAGNOSIS — C561 Malignant neoplasm of right ovary: Secondary | ICD-10-CM | POA: Diagnosis not present

## 2018-02-22 DIAGNOSIS — C9002 Multiple myeloma in relapse: Secondary | ICD-10-CM

## 2018-02-22 DIAGNOSIS — C9110 Chronic lymphocytic leukemia of B-cell type not having achieved remission: Secondary | ICD-10-CM

## 2018-02-22 DIAGNOSIS — Z90722 Acquired absence of ovaries, bilateral: Secondary | ICD-10-CM

## 2018-02-22 DIAGNOSIS — C786 Secondary malignant neoplasm of retroperitoneum and peritoneum: Secondary | ICD-10-CM

## 2018-02-22 DIAGNOSIS — T451X5A Adverse effect of antineoplastic and immunosuppressive drugs, initial encounter: Secondary | ICD-10-CM

## 2018-02-22 DIAGNOSIS — J181 Lobar pneumonia, unspecified organism: Principal | ICD-10-CM

## 2018-02-22 DIAGNOSIS — Z90710 Acquired absence of both cervix and uterus: Secondary | ICD-10-CM

## 2018-02-22 DIAGNOSIS — C562 Malignant neoplasm of left ovary: Secondary | ICD-10-CM | POA: Diagnosis not present

## 2018-02-22 DIAGNOSIS — J189 Pneumonia, unspecified organism: Secondary | ICD-10-CM

## 2018-02-22 DIAGNOSIS — Z9221 Personal history of antineoplastic chemotherapy: Secondary | ICD-10-CM

## 2018-02-22 DIAGNOSIS — D61810 Antineoplastic chemotherapy induced pancytopenia: Secondary | ICD-10-CM

## 2018-02-22 DIAGNOSIS — Z9079 Acquired absence of other genital organ(s): Secondary | ICD-10-CM

## 2018-02-22 DIAGNOSIS — R6883 Chills (without fever): Secondary | ICD-10-CM

## 2018-02-22 MED ORDER — PREDNISONE 20 MG PO TABS: 40.0000 mg | ORAL_TABLET | Freq: Every day | ORAL | 0 refills | Status: AC

## 2018-02-23 ENCOUNTER — Telehealth: Payer: Self-pay | Admitting: Hematology and Oncology

## 2018-02-23 ENCOUNTER — Encounter: Payer: Self-pay | Admitting: Hematology and Oncology

## 2018-02-23 NOTE — Telephone Encounter (Signed)
No 02/22/18 los.  °

## 2018-02-23 NOTE — Progress Notes (Signed)
[place] OFFICE PROGRESS NOTE  Patient Care Team: [name], MD as PCP - General [name], MD as Consulting Physician (Oncology) [name], MD as Consulting Physician (Neurosurgery) [name], MD as Consulting Physician (Hematology and Oncology) [name], [name], MD as Referring Physician (Hematology and Oncology)  ASSESSMENT & PLAN:  Multiple myeloma in relapse [facility]) I have reviewed multiple myeloma panel with the patient and her husband Her serum light chains are grossly abnormal but could be related to recent infection We discussed the risk and benefits of further evaluation For now, we will keep her treatment as scheduled I will delay Pomalyst by week due to recent infection We will continue close monitoring of myeloma panel on a monthly basis  Pancytopenia due to antineoplastic chemotherapy [facility]) She has recent significant pancytopenia due to chemotherapy Due to recent infection, I recommend delaying initiation of Pomalyst until next week  Chills (without fever) She was recently treated with antibiotic therapy along with prednisone She continues to have some mild congestion and occasional headaches She is wondering whether she should receive another course of oral antibiotics I am concerned about risk of antibiotics associated complication Clinically, she appears stable It is possible that her post treatment nasal drip and congestion are viral in nature or related to posttreatment inflammation I recommend a short course of prednisone therapy I plan to see her back next week for further follow-up and assessment We also discussed 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   INTERVAL HISTORY: Please see below for problem oriented charting. She returns with her husband for further follow-up and evaluation She was recently treated with a  course of antibiotic therapy for pneumonia She has some mild hemoptysis and abnormal chest x-ray Since then, she felt better with less cough She has some low-grade fever of 99 with occasional profuse sweating at home She also complained of some nasal drip and congestion Overall, she felt much improved since her antibiotic treatment No new bone pain  SUMMARY OF ONCOLOGIC HISTORY:   Multiple myeloma in relapse ([place])   07/21/2007 Bone Marrow Biopsy    Case #: [id number] Bone marrow showed myeloma    07/21/2007 Miscellaneous    She was diagnosed in May 2009. Had several cycles of RVD    10/14/2007 Bone Marrow Biopsy    Case #: [id number] repeat bone marrow biopsy showed good response to Rx    12/31/2007 Bone Marrow Transplant    She had autologous BMT at [facility]    07/19/2009 Bone Marrow Biopsy    Case #: [id number] Bone marrow biopsy showed only 1% plasma cells    10/10/2010 Bone Marrow Biopsy    [id number] Bone marrow biopsy showed 2% plasma cells    09/25/2011 Bone Marrow Biopsy    Accession #: [id number] Bone marrow only showed 4% plasma cells with ligh chain excess    10/14/2011 - 06/13/2013 Chemotherapy    She received Revlimid only, discontinued due to pancytopenia    06/18/2015 - 09/13/2015 Chemotherapy    She resumed taking Revlimid and dexamethasone. Treatment is stopped due to minimum response and pancytopenia    10/19/2015 -  Chemotherapy    The patient had Velcade for chemotherapy treatment along with weekly Daratumumab. Last dose of Velcade on 02/29/16, stopped due to progression. Pomalidomide added on 03/14/16    04/11/2016 Adverse Reaction    Delay resumption of Pomalyst cycle 2  until 04/16/16 due to neutropenia    09/16/2016 PET scan    1. No findings of active bony lymphoma. Prior activity in the sternum and thoracic spine has resolved. 2. Focal hypermetabolic activity along the sigmoid colon with some local inflammatory stranding in the adjacent mesentery, maximum SUV  11.0. This is probably from mild acute diverticulitis. There is no hypermetabolic activity in this vicinity 4 months ago to suggest that this is from colon cancer. Correlate with any symptoms. 3. Acute right maxillary sinusitis. 4. Thoracolumbar compression fractures, vertebral augmentation at the L1 level.    09/17/2016 Procedure    CT guided bone marrow biopsy of right posterior iliac bone with both aspirate and core samples obtained.    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 - TRILINEAGE HEMATOPOIESIS. - SEE COMMENT. PERIPHERAL BLOOD: - LYMPHOPENIA. - THROMBOCYTOPENIA. Diagnosis Note The bone marrow is slightly hypocellular for age with trilineage hematopoiesis and non-specific myeloid changes likely related to previous therapy. Plasma cells are increased in number representing 8% of all cells in the aspirate, but with lack of large aggregates or sheets in the clot and biopsy sections. Immunohistochemical stains highlight the slightly increased plasma cell component in the marrow which shows kappa light chain restriction consistent with residual/recurrent plasma cell neoplasm. Correlation with cytogenetic and FISH studies recommended    09/17/2016 Pathology Results    Normal bone marrow cytogenetics and FISH     REVIEW OF SYSTEMS:   Eyes: Denies blurriness of vision Ears, nose, mouth, throat, and face: Denies mucositis or sore throat Cardiovascular: Denies palpitation, chest discomfort or lower extremity swelling Gastrointestinal:  Denies nausea, heartburn or change in bowel habits Skin: Denies abnormal skin rashes Lymphatics: Denies new lymphadenopathy or easy bruising Neurological:Denies numbness, tingling or new weaknesses Behavioral/Psych: Mood is stable, no new changes  All other systems were reviewed with the patient and are negative.  I have reviewed the past  medical history, past surgical history, social history and family history with the patient and they are unchanged from previous note.  ALLERGIES:  is allergic to hydromorphone.  MEDICATIONS:  Current Outpatient Medications  Medication Sig Dispense Refill  . albuterol (PROVENTIL HFA;VENTOLIN HFA) 108 (90 Base) MCG/ACT inhaler Inhale 2 puffs into the lungs every 4 (four) hours as needed for wheezing or shortness of breath. 1 Inhaler 2  . aspirin 81 MG tablet Take 81 mg by mouth.    . chlorpheniramine-HYDROcodone (TUSSIONEX) 10-8 MG/5ML SUER Take 5 mLs by mouth every 12 (twelve) hours as needed for cough. 140 mL 0  . cholecalciferol (VITAMIN D) 1000 UNITS tablet Take 1,000 Units by mouth daily.    Marland Kitchen levofloxacin (LEVAQUIN) 500 MG tablet Take 1 tablet (500 mg total) by mouth daily. 9 tablet 0  . Multiple Vitamins-Minerals (ONE-A-DAY 50 PLUS PO) Take by mouth daily.    Marland Kitchen oxycodone (OXY-IR) 5 MG capsule Take 5 mg by mouth every 4 (four) hours as needed. pain    . Oxycodone HCl (OXYCONTIN) 60 MG TB12 Take 1 tablet by mouth 2 (two) times daily.    . pantoprazole (PROTONIX) 40 MG tablet TAKE (1) TABLET DAILY AS NEEDED. 30 tablet 0  . POMALYST 3 MG capsule TAKE 1 CAPSULE BY MOUTH ONCE DAILY FOR 21 DAYS ON AND 7 DAYS OFF 21 capsule 0  . predniSONE (DELTASONE) 20 MG tablet Take 2 tablets (40 mg total) by mouth daily with breakfast for  7 days. 14 tablet 0  . valACYclovir (VALTREX) 1000 MG tablet Take 1 tablet (1,000 mg total) by mouth daily. 90 tablet 11   No current facility-administered medications for this visit.     PHYSICAL EXAMINATION: ECOG PERFORMANCE STATUS: 1 - Symptomatic but completely ambulatory  Vitals:   02/22/18 1204  BP: 118/68  Pulse: 94  Resp: 18  Temp: 98.1 F (36.7 C)  SpO2: 100%   Filed Weights   02/22/18 1204  Weight: 123 lb 12.8 oz (56.2 kg)    GENERAL:alert, no distress and comfortable SKIN: skin color, texture, turgor are normal, no rashes or significant  lesions EYES: normal, Conjunctiva are pink and non-injected, sclera clear OROPHARYNX:no exudate, no erythema and lips, buccal mucosa, and tongue normal  NECK: supple, thyroid normal size, non-tender, without nodularity LYMPH:  no palpable lymphadenopathy in the cervical, axillary or inguinal LUNGS: clear to auscultation and percussion with normal breathing effort HEART: regular rate & rhythm and no murmurs and no lower extremity edema ABDOMEN:abdomen soft, non-tender and normal bowel sounds Musculoskeletal:no cyanosis of digits and no clubbing  NEURO: alert & oriented x 3 with fluent speech, no focal motor/sensory deficits  LABORATORY DATA:  I have reviewed the data as listed    Component Value Date/Time   NA 142 02/11/2018 1256   NA 140 03/11/2017 1150   K 4.0 02/11/2018 1256   K 3.9 03/11/2017 1150   CL 104 02/11/2018 1256   CL 107 08/30/2012 1055   CO2 29 02/11/2018 1256   CO2 29 03/11/2017 1150   GLUCOSE 95 02/11/2018 1256   GLUCOSE 93 03/11/2017 1150   GLUCOSE 102 (H) 08/30/2012 1055   BUN 11 02/11/2018 1256   BUN 12.5 03/11/2017 1150   CREATININE 0.82 02/11/2018 1256   CREATININE 0.8 03/11/2017 1150   CALCIUM 9.3 02/11/2018 1256   CALCIUM 8.7 03/11/2017 1150   PROT 6.6 02/11/2018 1256   PROT 6.0 (L) 03/11/2017 1150   ALBUMIN 4.0 02/11/2018 1256   ALBUMIN 3.8 03/11/2017 1150   AST 18 02/11/2018 1256   AST 18 03/11/2017 1150   ALT 13 02/11/2018 1256   ALT 14 03/11/2017 1150   ALKPHOS 56 02/11/2018 1256   ALKPHOS 45 03/11/2017 1150   BILITOT 0.7 02/11/2018 1256   BILITOT 0.37 03/11/2017 1150   GFRNONAA >60 02/11/2018 1256   GFRAA >60 02/11/2018 1256    No results found for: SPEP, UPEP  Lab Results  Component Value Date   WBC 2.4 (L) 02/11/2018   NEUTROABS 0.8 (L) 02/11/2018   HGB 13.5 02/11/2018   HCT 40.6 02/11/2018   MCV 103.3 (H) 02/11/2018   PLT 96 (L) 02/11/2018      Chemistry      Component Value Date/Time   NA 142 02/11/2018 1256   NA 140  03/11/2017 1150   K 4.0 02/11/2018 1256   K 3.9 03/11/2017 1150   CL 104 02/11/2018 1256   CL 107 08/30/2012 1055   CO2 29 02/11/2018 1256   CO2 29 03/11/2017 1150   BUN 11 02/11/2018 1256   BUN 12.5 03/11/2017 1150   CREATININE 0.82 02/11/2018 1256   CREATININE 0.8 03/11/2017 1150      Component Value Date/Time   CALCIUM 9.3 02/11/2018 1256   CALCIUM 8.7 03/11/2017 1150   ALKPHOS 56 02/11/2018 1256   ALKPHOS 45 03/11/2017 1150   AST 18 02/11/2018 1256   AST 18 03/11/2017 1150   ALT 13 02/11/2018 1256   ALT 14 03/11/2017 1150  BILITOT 0.7 02/11/2018 1256   BILITOT 0.37 03/11/2017 1150       RADIOGRAPHIC STUDIES: I have personally reviewed the radiological images as listed and agreed with the findings in the report. Dg Chest 2 View  Result Date: 02/11/2018 CLINICAL DATA:  Low-grade fever, cough, hemoptysis, congestion over the last 3 days, history of multiple myeloma EXAM: CHEST - 2 VIEW COMPARISON:  Chest x-ray of 03/11/2016 FINDINGS: There has been linear scarring present previously at the left lung base. However there are more prominent markings medially at the left lung base overlying the heart shadow suggestive of left lower lobe patchy pneumonia. The right lung is clear. Mediastinal and hilar contours are stable. Right-sided Port-A-Cath tip overlies the mid lower SVC and heart size is stable. There are thoracic and upper lumbar compression deformities present and the bones are osteopenic with thoracic kyphosis noted. IMPRESSION: 1. More prominent markings medially at the left lung base may represent patchy pneumonia. Recommend follow-up chest x-ray. 2. Stable linear scarring at the left lung base. 3. Osteopenia and thoracic kyphosis with stable thoracic and lumbar vertebral compression deformities. Electronically Signed   By: Ivar Drape M.D.   On: 02/11/2018 12:47    All questions were answered. The patient knows to call the clinic with any problems, questions or concerns. No  barriers to learning was detected.  I spent 15 minutes counseling the patient face to face. The total time spent in the appointment was 20 minutes and more than 50% was on counseling and review of test results  [name], MD 02/23/2018 10:09 AM

## 2018-02-23 NOTE — Assessment & Plan Note (Addendum)
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

## 2018-02-23 NOTE — Assessment & Plan Note (Signed)
She has recent significant pancytopenia due to chemotherapy Due to recent infection, I recommend delaying initiation of Pomalyst until next week

## 2018-02-23 NOTE — Assessment & Plan Note (Signed)
I have reviewed multiple myeloma panel with the patient and her husband Her serum light chains are grossly abnormal but could be related to recent infection We discussed the risk and benefits of further evaluation For now, we will keep her treatment as scheduled I will delay Pomalyst by week due to recent infection We will continue close monitoring of myeloma panel on a monthly basis

## 2018-03-04 ENCOUNTER — Other Ambulatory Visit: Payer: 59

## 2018-03-04 ENCOUNTER — Ambulatory Visit: Payer: 59 | Admitting: Hematology and Oncology

## 2018-03-05 ENCOUNTER — Encounter: Payer: Self-pay | Admitting: Hematology and Oncology

## 2018-03-05 ENCOUNTER — Inpatient Hospital Stay (HOSPITAL_BASED_OUTPATIENT_CLINIC_OR_DEPARTMENT_OTHER): Payer: 59 | Admitting: Hematology and Oncology

## 2018-03-05 ENCOUNTER — Inpatient Hospital Stay: Payer: 59

## 2018-03-05 VITALS — BP 127/84 | HR 83 | Temp 98.2°F | Resp 16

## 2018-03-05 DIAGNOSIS — T451X5A Adverse effect of antineoplastic and immunosuppressive drugs, initial encounter: Secondary | ICD-10-CM

## 2018-03-05 DIAGNOSIS — D801 Nonfamilial hypogammaglobulinemia: Secondary | ICD-10-CM

## 2018-03-05 DIAGNOSIS — C9002 Multiple myeloma in relapse: Secondary | ICD-10-CM | POA: Diagnosis not present

## 2018-03-05 DIAGNOSIS — Z79899 Other long term (current) drug therapy: Secondary | ICD-10-CM

## 2018-03-05 DIAGNOSIS — D61810 Antineoplastic chemotherapy induced pancytopenia: Secondary | ICD-10-CM

## 2018-03-05 DIAGNOSIS — Z95828 Presence of other vascular implants and grafts: Secondary | ICD-10-CM

## 2018-03-05 LAB — COMPREHENSIVE METABOLIC PANEL
ALT: 15 U/L (ref 0–44)
ANION GAP: 8 (ref 5–15)
AST: 19 U/L (ref 15–41)
Albumin: 3.8 g/dL (ref 3.5–5.0)
Alkaline Phosphatase: 45 U/L (ref 38–126)
BUN: 13 mg/dL (ref 6–20)
CO2: 27 mmol/L (ref 22–32)
Calcium: 8.7 mg/dL — ABNORMAL LOW (ref 8.9–10.3)
Chloride: 105 mmol/L (ref 98–111)
Creatinine, Ser: 0.78 mg/dL (ref 0.44–1.00)
GFR calc Af Amer: >60 mL/min (ref >60)
GFR calc non Af Amer: >60 mL/min (ref >60)
Glucose, Bld: 91 mg/dL (ref 70–99)
POTASSIUM: 4.1 mmol/L (ref 3.5–5.1)
SODIUM: 140 mmol/L (ref 135–145)
Total Bilirubin: 0.5 mg/dL (ref 0.3–1.2)
Total Protein: 5.6 g/dL — ABNORMAL LOW (ref 6.5–8.1)

## 2018-03-05 LAB — CBC WITH DIFFERENTIAL/PLATELET
Abs Immature Granulocytes: 0.02 10*3/uL (ref 0.00–0.07)
BASOS ABS: 0.1 10*3/uL (ref 0.0–0.1)
Basophils Relative: 3 %
Eosinophils Absolute: 0.4 10*3/uL (ref 0.0–0.5)
Eosinophils Relative: 9 %
HCT: 34.7 % — ABNORMAL LOW (ref 36.0–46.0)
Hemoglobin: 11.3 g/dL — ABNORMAL LOW (ref 12.0–15.0)
IMMATURE GRANULOCYTES: 0 %
Lymphocytes Relative: 25 %
Lymphs Abs: 1.2 10*3/uL (ref 0.7–4.0)
MCH: 34.7 pg — ABNORMAL HIGH (ref 26.0–34.0)
MCHC: 32.6 g/dL (ref 30.0–36.0)
MCV: 106.4 fL — ABNORMAL HIGH (ref 80.0–100.0)
Monocytes Absolute: 0.8 10*3/uL (ref 0.1–1.0)
Monocytes Relative: 17 %
NEUTROS ABS: 2.1 10*3/uL (ref 1.7–7.7)
NEUTROS PCT: 46 %
Platelets: 125 10*3/uL — ABNORMAL LOW (ref 150–400)
RBC: 3.26 MIL/uL — ABNORMAL LOW (ref 3.87–5.11)
RDW: 14.3 % (ref 11.5–15.5)
WBC: 4.6 10*3/uL (ref 4.0–10.5)
nRBC: 0 % (ref 0.0–0.2)

## 2018-03-05 MED ORDER — ACETAMINOPHEN 325 MG PO TABS
ORAL_TABLET | ORAL | Status: AC
  Filled 2018-03-05: qty 2

## 2018-03-05 MED ORDER — SODIUM CHLORIDE 0.9% FLUSH
10.0000 mL | INTRAVENOUS | Status: DC | PRN
  Administered 2018-03-05: 10 mL
  Filled 2018-03-05: qty 10

## 2018-03-05 MED ORDER — PROCHLORPERAZINE MALEATE 10 MG PO TABS
ORAL_TABLET | ORAL | Status: AC
  Filled 2018-03-05: qty 1

## 2018-03-05 MED ORDER — ZOLEDRONIC ACID 4 MG/100ML IV SOLN
4.0000 mg | Freq: Once | INTRAVENOUS | Status: DC
  Filled 2018-03-05: qty 100

## 2018-03-05 MED ORDER — SODIUM CHLORIDE 0.9 % IV SOLN
Freq: Once | INTRAVENOUS | Status: AC
  Administered 2018-03-05: 09:00:00 via INTRAVENOUS
  Filled 2018-03-05: qty 250

## 2018-03-05 MED ORDER — DIPHENHYDRAMINE HCL 25 MG PO CAPS
ORAL_CAPSULE | ORAL | Status: AC
  Filled 2018-03-05: qty 2

## 2018-03-05 MED ORDER — PROCHLORPERAZINE MALEATE 10 MG PO TABS
10.0000 mg | ORAL_TABLET | Freq: Once | ORAL | Status: AC
  Administered 2018-03-05: 10 mg via ORAL

## 2018-03-05 MED ORDER — HEPARIN SOD (PORK) LOCK FLUSH 100 UNIT/ML IV SOLN
500.0000 [IU] | Freq: Once | INTRAVENOUS | Status: AC | PRN
  Administered 2018-03-05: 500 [IU]
  Filled 2018-03-05: qty 5

## 2018-03-05 MED ORDER — DIPHENHYDRAMINE HCL 25 MG PO CAPS
50.0000 mg | ORAL_CAPSULE | Freq: Once | ORAL | Status: AC
  Administered 2018-03-05: 50 mg via ORAL

## 2018-03-05 MED ORDER — SODIUM CHLORIDE 0.9 % IV SOLN
15.2000 mg/kg | Freq: Once | INTRAVENOUS | Status: AC
  Administered 2018-03-05: 900 mg via INTRAVENOUS
  Filled 2018-03-05: qty 40

## 2018-03-05 MED ORDER — ACETAMINOPHEN 325 MG PO TABS
650.0000 mg | ORAL_TABLET | Freq: Once | ORAL | Status: AC
  Administered 2018-03-05: 650 mg via ORAL

## 2018-03-05 MED ORDER — SODIUM CHLORIDE 0.9 % IV SOLN
20.0000 mg | Freq: Once | INTRAVENOUS | Status: AC
  Administered 2018-03-05: 20 mg via INTRAVENOUS
  Filled 2018-03-05: qty 2

## 2018-03-05 NOTE — Assessment & Plan Note (Signed)
She has recovered fully from recent upper respiratory tract infection She will continue monthly daratumumab along with Pomalyst as directed She is reminded to take calcium with vitamin D and will receive Zometa every 6 months She will continue acyclovir for antimicrobial prophylaxis and aspirin for DVT prophylaxis

## 2018-03-05 NOTE — Assessment & Plan Note (Signed)
She has recent recovery of pancytopenia from recent chemotherapy We will proceed with treatment without delay

## 2018-03-05 NOTE — Patient Instructions (Signed)
[facility] Discharge Instructions for Patients Receiving Chemotherapy  Today you received the following chemotherapy agents Daratumumab(Darzalex)  To help prevent nausea and vomiting after your treatment, we encourage you to take your nausea medication as directed.    If you develop nausea and vomiting that is not controlled by your nausea medication, call the clinic.   BELOW ARE SYMPTOMS THAT SHOULD BE REPORTED IMMEDIATELY:  *FEVER GREATER THAN 100.5 F  *CHILLS WITH OR WITHOUT FEVER  NAUSEA AND VOMITING THAT IS NOT CONTROLLED WITH YOUR NAUSEA MEDICATION  *UNUSUAL SHORTNESS OF BREATH  *UNUSUAL BRUISING OR BLEEDING  TENDERNESS IN MOUTH AND THROAT WITH OR WITHOUT PRESENCE OF ULCERS  *URINARY PROBLEMS  *BOWEL PROBLEMS  UNUSUAL RASH Items with * indicate a potential emergency and should be followed up as soon as possible.  Feel free to call the clinic should you have any questions or concerns. The clinic phone number is [phone].  Please show the CHEMO ALERT CARD at check-in to the Emergency Department and triage nurse.   

## 2018-03-05 NOTE — Assessment & Plan Note (Signed)
The patient has recovered from infection at this time.  We reviewed neutropenic precautions.

## 2018-03-05 NOTE — Progress Notes (Signed)
[place] OFFICE PROGRESS NOTE  Patient Care Team: [name], MD as PCP - General [name], MD as Consulting Physician (Oncology) [name], MD as Consulting Physician (Neurosurgery) [name], MD as Consulting Physician (Hematology and Oncology) [name], [name], MD as Referring Physician (Hematology and Oncology)  ASSESSMENT & PLAN:  Multiple myeloma in relapse [facility]) She has recovered fully from recent upper respiratory tract infection She will continue monthly daratumumab along with Pomalyst as directed She is reminded to take calcium with vitamin D and will receive Zometa every 6 months She will continue acyclovir for antimicrobial prophylaxis and aspirin for DVT prophylaxis  Pancytopenia due to antineoplastic chemotherapy [facility]) She has recent recovery of pancytopenia from recent chemotherapy We will proceed with treatment without delay  Hypogammaglobulinemia, acquired The patient has recovered from infection at this time.  We reviewed neutropenic precautions.     No orders of the defined types were placed in this encounter.   INTERVAL HISTORY: Please see below for problem oriented charting. She is seen in the infusion room She is feeling better since last time I saw her The steroids have helped with her symptoms tremendously She denies further fever or cough The patient denies any recent signs or symptoms of bleeding such as spontaneous epistaxis, hematuria or hematochezia.  SUMMARY OF ONCOLOGIC HISTORY:   Multiple myeloma in relapse ([place])   07/21/2007 Bone Marrow Biopsy    Case #: [id number] Bone marrow showed myeloma    07/21/2007 Miscellaneous    She was diagnosed in May 2009. 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 Treatment is stopped due to minimum response and pancytopenia    10/19/2015 -  Chemotherapy    The patient had Velcade for chemotherapy treatment along with weekly Daratumumab. Last dose of Velcade on 02/29/16, stopped due to progression. Pomalidomide added on 03/14/16    04/11/2016 Adverse Reaction    Delay resumption of Pomalyst cycle 2 until 04/16/16 due to neutropenia    09/16/2016 PET scan    1. No findings of active bony lymphoma. Prior activity in the sternum and thoracic spine has resolved. 2. Focal hypermetabolic activity along the sigmoid colon with some local inflammatory stranding in the adjacent mesentery, maximum SUV 11.0. This is probably from mild acute diverticulitis. There is no hypermetabolic activity in this vicinity 4 months ago to suggest that this is from colon cancer. Correlate with any symptoms. 3. Acute right maxillary sinusitis. 4. Thoracolumbar compression fractures, vertebral augmentation at the L1 level.    09/17/2016 Procedure    CT guided bone marrow biopsy of right posterior iliac bone with both aspirate and core samples obtained.    09/17/2016 Bone Marrow Biopsy    Bone  Marrow, Aspirate,Biopsy, and Clot, right iliac - SLIGHTLY HYPOCELLULAR BONE MARROW FOR AGE WITH PLASMA CELL NEOPLASM. - TRILINEAGE HEMATOPOIESIS. - SEE COMMENT. PERIPHERAL BLOOD: - LYMPHOPENIA. - THROMBOCYTOPENIA. Diagnosis Note The bone marrow is slightly hypocellular for age with trilineage hematopoiesis and non-specific myeloid  changes likely related to previous therapy. Plasma cells are increased in number representing 8% of all cells in the aspirate, but with lack of large aggregates or sheets in the clot and biopsy sections. Immunohistochemical stains highlight the slightly increased plasma cell component in the marrow which shows kappa light chain restriction consistent with residual/recurrent plasma cell neoplasm. Correlation with cytogenetic and FISH studies recommended    09/17/2016 Pathology Results    Normal bone marrow cytogenetics and FISH     REVIEW OF SYSTEMS:   Constitutional: Denies fevers, chills or abnormal weight loss Eyes: Denies blurriness of vision Ears, nose, mouth, throat, and face: Denies mucositis or sore throat Respiratory: Denies cough, dyspnea or wheezes Cardiovascular: Denies palpitation, chest discomfort or lower extremity swelling Gastrointestinal:  Denies nausea, heartburn or change in bowel habits Skin: Denies abnormal skin rashes Lymphatics: Denies new lymphadenopathy or easy bruising Neurological:Denies numbness, tingling or new weaknesses Behavioral/Psych: Mood is stable, no new changes  All other systems were reviewed with the patient and are negative.  I have reviewed the past medical history, past surgical history, social history and family history with the patient and they are unchanged from previous note.  ALLERGIES:  is allergic to hydromorphone.  MEDICATIONS:  Current Outpatient Medications  Medication Sig Dispense Refill  . albuterol (PROVENTIL HFA;VENTOLIN HFA) 108 (90 Base) MCG/ACT inhaler Inhale 2 puffs into the lungs every 4 (four) hours as needed for wheezing or shortness of breath. 1 Inhaler 2  . aspirin 81 MG tablet Take 81 mg by mouth.    . cholecalciferol (VITAMIN D) 1000 UNITS tablet Take 1,000 Units by mouth daily.    . Multiple Vitamins-Minerals (ONE-A-DAY 50 PLUS PO) Take by mouth daily.    Marland Kitchen oxycodone (OXY-IR) 5 MG capsule Take 5 mg by mouth every 4  (four) hours as needed. pain    . Oxycodone HCl (OXYCONTIN) 60 MG TB12 Take 1 tablet by mouth 2 (two) times daily.    . pantoprazole (PROTONIX) 40 MG tablet TAKE (1) TABLET DAILY AS NEEDED. 30 tablet 0  . POMALYST 3 MG capsule TAKE 1 CAPSULE BY MOUTH ONCE DAILY FOR 21 DAYS ON AND 7 DAYS OFF 21 capsule 0  . valACYclovir (VALTREX) 1000 MG tablet Take 1 tablet (1,000 mg total) by mouth daily. 90 tablet 11   No current facility-administered medications for this visit.    Facility-Administered Medications Ordered in Other Visits  Medication Dose Route Frequency Provider Last Rate Last Dose  . 0.9 %  sodium chloride infusion   Intravenous Once Alvy Bimler, Zyla Dascenzo, MD      . acetaminophen (TYLENOL) tablet 650 mg  650 mg Oral Once Alvy Bimler, Casey Maxfield, MD      . daratumumab (DARZALEX) 900 mg in sodium chloride 0.9 % 455 mL chemo infusion  15.2 mg/kg (Treatment Plan Recorded) Intravenous Once Alvy Bimler, Dhruv Christina, MD      . dexamethasone (DECADRON) 20 mg in sodium chloride 0.9 % 50 mL IVPB  20 mg Intravenous Once Joshuwa Vecchio, MD      . diphenhydrAMINE (BENADRYL) capsule 50 mg  50 mg Oral Once Rielle Schlauch, MD      . heparin lock flush 100 unit/mL  500 Units Intracatheter Once PRN [name], MD      .  prochlorperazine (COMPAZINE) tablet 10 mg  10 mg Oral Once Alvy Bimler, Lauri Purdum, MD      . sodium chloride flush (NS) 0.9 % injection 10 mL  10 mL Intracatheter PRN Alvy Bimler, Mylon Mabey, MD      . Zoledronic Acid (ZOMETA) 4 mg IVPB  4 mg Intravenous Once Alvy Bimler, Adriaan Maltese, MD        PHYSICAL EXAMINATION: ECOG PERFORMANCE STATUS: 1 - Symptomatic but completely ambulatory GENERAL:alert, no distress and comfortable SKIN: skin color, texture, turgor are normal, no rashes or significant lesions EYES: normal, Conjunctiva are pink and non-injected, sclera clear OROPHARYNX:no exudate, no erythema and lips, buccal mucosa, and tongue normal  NECK: supple, thyroid normal size, non-tender, without nodularity LYMPH:  no palpable lymphadenopathy in the cervical,  axillary or inguinal LUNGS: clear to auscultation and percussion with normal breathing effort HEART: regular rate & rhythm and no murmurs and no lower extremity edema ABDOMEN:abdomen soft, non-tender and normal bowel sounds Musculoskeletal:no cyanosis of digits and no clubbing  NEURO: alert & oriented x 3 with fluent speech, no focal motor/sensory deficits  LABORATORY DATA:  I have reviewed the data as listed    Component Value Date/Time   NA 140 03/05/2018 0801   NA 140 03/11/2017 1150   K 4.1 03/05/2018 0801   K 3.9 03/11/2017 1150   CL 105 03/05/2018 0801   CL 107 08/30/2012 1055   CO2 27 03/05/2018 0801   CO2 29 03/11/2017 1150   GLUCOSE 91 03/05/2018 0801   GLUCOSE 93 03/11/2017 1150   GLUCOSE 102 (H) 08/30/2012 1055   BUN 13 03/05/2018 0801   BUN 12.5 03/11/2017 1150   CREATININE 0.78 03/05/2018 0801   CREATININE 0.82 02/11/2018 1256   CREATININE 0.8 03/11/2017 1150   CALCIUM 8.7 (L) 03/05/2018 0801   CALCIUM 8.7 03/11/2017 1150   PROT 5.6 (L) 03/05/2018 0801   PROT 6.0 (L) 03/11/2017 1150   ALBUMIN 3.8 03/05/2018 0801   ALBUMIN 3.8 03/11/2017 1150   AST 19 03/05/2018 0801   AST 18 02/11/2018 1256   AST 18 03/11/2017 1150   ALT 15 03/05/2018 0801   ALT 13 02/11/2018 1256   ALT 14 03/11/2017 1150   ALKPHOS 45 03/05/2018 0801   ALKPHOS 45 03/11/2017 1150   BILITOT 0.5 03/05/2018 0801   BILITOT 0.7 02/11/2018 1256   BILITOT 0.37 03/11/2017 1150   GFRNONAA >60 03/05/2018 0801   GFRNONAA >60 02/11/2018 1256   GFRAA >60 03/05/2018 0801   GFRAA >60 02/11/2018 1256    No results found for: SPEP, UPEP  Lab Results  Component Value Date   WBC 4.6 03/05/2018   NEUTROABS 2.1 03/05/2018   HGB 11.3 (L) 03/05/2018   HCT 34.7 (L) 03/05/2018   MCV 106.4 (H) 03/05/2018   PLT 125 (L) 03/05/2018      Chemistry      Component Value Date/Time   NA 140 03/05/2018 0801   NA 140 03/11/2017 1150   K 4.1 03/05/2018 0801   K 3.9 03/11/2017 1150   CL 105 03/05/2018 0801    CL 107 08/30/2012 1055   CO2 27 03/05/2018 0801   CO2 29 03/11/2017 1150   BUN 13 03/05/2018 0801   BUN 12.5 03/11/2017 1150   CREATININE 0.78 03/05/2018 0801   CREATININE 0.82 02/11/2018 1256   CREATININE 0.8 03/11/2017 1150      Component Value Date/Time   CALCIUM 8.7 (L) 03/05/2018 0801   CALCIUM 8.7 03/11/2017 1150   ALKPHOS 45 03/05/2018 0801   ALKPHOS 45  03/11/2017 1150   AST 19 03/05/2018 0801   AST 18 02/11/2018 1256   AST 18 03/11/2017 1150   ALT 15 03/05/2018 0801   ALT 13 02/11/2018 1256   ALT 14 03/11/2017 1150   BILITOT 0.5 03/05/2018 0801   BILITOT 0.7 02/11/2018 1256   BILITOT 0.37 03/11/2017 1150       RADIOGRAPHIC STUDIES: I have personally reviewed the radiological images as listed and agreed with the findings in the report. Dg Chest 2 View  Result Date: 02/11/2018 CLINICAL DATA:  Low-grade fever, cough, hemoptysis, congestion over the last 3 days, history of multiple myeloma EXAM: CHEST - 2 VIEW COMPARISON:  Chest x-ray of 03/11/2016 FINDINGS: There has been linear scarring present previously at the left lung base. However there are more prominent markings medially at the left lung base overlying the heart shadow suggestive of left lower lobe patchy pneumonia. The right lung is clear. Mediastinal and hilar contours are stable. Right-sided Port-A-Cath tip overlies the mid lower SVC and heart size is stable. There are thoracic and upper lumbar compression deformities present and the bones are osteopenic with thoracic kyphosis noted. IMPRESSION: 1. More prominent markings medially at the left lung base may represent patchy pneumonia. Recommend follow-up chest x-ray. 2. Stable linear scarring at the left lung base. 3. Osteopenia and thoracic kyphosis with stable thoracic and lumbar vertebral compression deformities. Electronically Signed   By: Ivar Drape M.D.   On: 02/11/2018 12:47    All questions were answered. The patient knows to call the clinic with any  problems, questions or concerns. No barriers to learning was detected.  I spent 15 minutes counseling the patient face to face. The total time spent in the appointment was 20 minutes and more than 50% was on counseling and review of test results  [name], MD 03/05/2018 9:26 AM

## 2018-03-08 ENCOUNTER — Telehealth: Payer: Self-pay | Admitting: Hematology and Oncology

## 2018-03-08 LAB — MULTIPLE MYELOMA PANEL, SERUM
Albumin SerPl Elph-Mcnc: 3.6 g/dL (ref 2.9–4.4)
Albumin/Glob SerPl: 2.3 — ABNORMAL HIGH (ref 0.7–1.7)
Alpha 1: 0.2 g/dL (ref 0.0–0.4)
Alpha2 Glob SerPl Elph-Mcnc: 0.6 g/dL (ref 0.4–1.0)
B-Globulin SerPl Elph-Mcnc: 0.6 g/dL — ABNORMAL LOW (ref 0.7–1.3)
Gamma Glob SerPl Elph-Mcnc: 0.2 g/dL — ABNORMAL LOW (ref 0.4–1.8)
Globulin, Total: 1.6 g/dL — ABNORMAL LOW (ref 2.2–3.9)
IGM (IMMUNOGLOBULIN M), SRM: 15 mg/dL — AB (ref 26–217)
IgA: 10 mg/dL — ABNORMAL LOW (ref 87–352)
IgG (Immunoglobin G), Serum: 280 mg/dL — ABNORMAL LOW (ref 700–1600)
M Protein SerPl Elph-Mcnc: 0.1 g/dL — ABNORMAL HIGH
Total Protein ELP: 5.2 g/dL — ABNORMAL LOW (ref 6.0–8.5)

## 2018-03-08 LAB — KAPPA/LAMBDA LIGHT CHAINS
KAPPA, LAMDA LIGHT CHAIN RATIO: 50.2 — AB (ref 0.26–1.65)
Kappa free light chain: 100.4 mg/L — ABNORMAL HIGH (ref 3.3–19.4)
Lambda free light chains: 2 mg/L — ABNORMAL LOW (ref 5.7–26.3)

## 2018-03-08 NOTE — Telephone Encounter (Signed)
Per 12/27 no new orders

## 2018-03-09 ENCOUNTER — Other Ambulatory Visit: Payer: Self-pay | Admitting: *Deleted

## 2018-03-09 ENCOUNTER — Telehealth: Payer: Self-pay | Admitting: *Deleted

## 2018-03-09 ENCOUNTER — Telehealth: Payer: Self-pay | Admitting: Hematology and Oncology

## 2018-03-09 MED ORDER — POMALIDOMIDE 3 MG PO CAPS: ORAL_CAPSULE | ORAL | 0 refills | Status: DC

## 2018-03-09 NOTE — Telephone Encounter (Signed)
I have spoken with the patient over the telephone and reviewed the light chain results which came back high persistently over the last month. I recommend referral back to [facility] to consider enrollment to clinical trial.  She agree with the plan of care.

## 2018-03-09 NOTE — Telephone Encounter (Signed)
Telephone call to [facility] at [facility] is closed from 12/23-03/11/2018 will attempt to call on 03/11/18.   Dr. Terrial Rhodes Tuchman's office number [phone]

## 2018-03-12 ENCOUNTER — Telehealth: Payer: Self-pay | Admitting: *Deleted

## 2018-03-12 NOTE — Telephone Encounter (Signed)
Telephone call to [facility] for clinical trial referral. Spoke to Cristen Bredeson- [phone] Clinical coordinator for Multiple Myeloma.   There are few trials opening up and she will review history and contact patient/this office with appointment information or options.  Faxed last office note, labs and treatment flowsheet to [phone]. Confirmation received.

## 2018-03-17 ENCOUNTER — Telehealth: Payer: Self-pay

## 2018-03-17 NOTE — Telephone Encounter (Signed)
Dr. Phoebe Perch office at [facility]  called and gave appt for 1/14 at 3 pm, arrive prior to 3 pm.  Called patient and given appt information. She verbalized understanding.

## 2018-03-17 NOTE — Telephone Encounter (Signed)
Called and left a message for Raelyn Ensign intake person for Dr. Amalia Hailey at [facility] ask her to call the office. Received a call from Nancie Neas from clinical trial at [facility] to call Marliss Coots to get appt for Jessica Cochran and send referral.  Faxed referral to Raelyn Ensign [phone].

## 2018-03-17 NOTE — Telephone Encounter (Signed)
Called back after she left a message to call her. She will talk with Dr. Fenton Malling and call back with appt time.

## 2018-03-24 ENCOUNTER — Telehealth: Payer: Self-pay

## 2018-03-24 NOTE — Telephone Encounter (Signed)
Called and left a message asking her to call the office. 

## 2018-03-24 NOTE — Telephone Encounter (Signed)
-----   Message from [name], MD sent at 03/24/2018  8:45 AM EST ----- Regarding: [facility] referral Tell her we need to sit down and review the recommendations from [facility]. I won't be here on 1/24

## 2018-03-24 NOTE — Telephone Encounter (Signed)
Called [name] and given appt 1/21 at 1230.

## 2018-03-30 ENCOUNTER — Encounter: Payer: Self-pay | Admitting: Hematology and Oncology

## 2018-03-30 ENCOUNTER — Inpatient Hospital Stay: Payer: 59 | Attending: Hematology and Oncology | Admitting: Hematology and Oncology

## 2018-03-30 ENCOUNTER — Telehealth: Payer: Self-pay | Admitting: Hematology and Oncology

## 2018-03-30 VITALS — BP 130/87 | HR 114 | Temp 98.0°F | Resp 18 | Ht 59.0 in | Wt 121.6 lb

## 2018-03-30 DIAGNOSIS — C9002 Multiple myeloma in relapse: Secondary | ICD-10-CM

## 2018-03-30 DIAGNOSIS — Z5112 Encounter for antineoplastic immunotherapy: Secondary | ICD-10-CM | POA: Insufficient documentation

## 2018-03-30 NOTE — Assessment & Plan Note (Signed)
I have reviewed documentation from [facility] and review her test results I recommend repeat staging with 24 hours urine collection for UPEP, quantitative light chains along with PET CT scan In the mean time, she will proceed with treatment as scheduled later this week

## 2018-03-30 NOTE — Telephone Encounter (Signed)
No los °

## 2018-03-30 NOTE — Progress Notes (Signed)
[place] OFFICE PROGRESS NOTE  Patient Care Team: [name], MD as PCP - General [name], MD as Consulting Physician (Oncology) [name], MD as Consulting Physician (Neurosurgery) [name], MD as Consulting Physician (Hematology and Oncology) [name], [name], MD as Referring Physician (Hematology and Oncology)  ASSESSMENT & PLAN:  Multiple myeloma in relapse [facility]) I have reviewed documentation from [facility] and review her test results I recommend repeat staging with 24 hours urine collection for UPEP, quantitative light chains along with PET CT scan In the mean time, she will proceed with treatment as scheduled later this week   Orders Placed This Encounter  Procedures  . NM PET Image Restage (PS) Whole Body    Standing Status:   Future    Standing Expiration Date:   03/30/2019    Order Specific Question:   If indicated for the ordered procedure, I authorize the administration of a radiopharmaceutical per Radiology protocol    Answer:   Yes    Order Specific Question:   Is the patient pregnant?    Answer:   No    Order Specific Question:   Preferred imaging location?    Answer:   [facility]    Order Specific Question:   Radiology Contrast Protocol - do NOT remove file path    Answer:   [email or web address]  . UPEP/UIFE/Light Chains/TP, 24-Hr Ur    Standing Status:   Future    Standing Expiration Date:   05/04/2019  . Kappa/Lambda Light Chains, Free, With Ratio, 24Hr. Urine    Standing Status:   Future    Standing Expiration Date:   05/04/2019    INTERVAL HISTORY: Please see below for problem oriented charting. She returns with her husband to discuss recommendation from [facility] I have reviewed her outside records and review test results with the patient and her husband She feels well No further cough or signs of infection No new bone pain  SUMMARY OF ONCOLOGIC HISTORY:   Multiple myeloma in relapse  ([place])   07/21/2007 Bone Marrow Biopsy    Case #: [id number] Bone marrow showed myeloma    07/21/2007 Miscellaneous    She was diagnosed in May 2009. Had several cycles of RVD    10/14/2007 Bone Marrow Biopsy    Case #: [id number] repeat bone marrow biopsy showed good response to Rx    12/31/2007 Bone Marrow Transplant    She had autologous BMT at [facility]    07/19/2009 Bone Marrow Biopsy    Case #: [id number] Bone marrow biopsy showed only 1% plasma cells    10/10/2010 Bone Marrow Biopsy    [id number] Bone marrow biopsy showed 2% plasma cells    09/25/2011 Bone Marrow Biopsy    Accession #: [id number] Bone marrow only showed 4% plasma cells with ligh chain excess    10/14/2011 - 06/13/2013 Chemotherapy    She received Revlimid only, discontinued due to pancytopenia    06/18/2015 - 09/13/2015 Chemotherapy    She resumed taking Revlimid and dexamethasone. Treatment is stopped due to minimum response and pancytopenia    10/19/2015 -  Chemotherapy    The patient had Velcade for chemotherapy treatment along with weekly Daratumumab. Last dose of Velcade on 02/29/16, stopped due to progression. Pomalidomide added on 03/14/16    04/11/2016 Adverse Reaction    Delay resumption of Pomalyst cycle 2 until 04/16/16 due to neutropenia    09/16/2016 PET scan    1. No findings of active bony  lymphoma. Prior activity in the sternum and thoracic spine has resolved. 2. Focal hypermetabolic activity along the sigmoid colon with some local inflammatory stranding in the adjacent mesentery, maximum SUV 11.0. This is probably from mild acute diverticulitis. There is no hypermetabolic activity in this vicinity 4 months ago to suggest that this is from colon cancer. Correlate with any symptoms. 3. Acute right maxillary sinusitis. 4. Thoracolumbar compression fractures, vertebral augmentation at the L1 level.    09/17/2016 Procedure    CT guided bone marrow biopsy of right posterior iliac bone with both aspirate and  core samples obtained.    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 - TRILINEAGE HEMATOPOIESIS. - SEE COMMENT. PERIPHERAL BLOOD: - LYMPHOPENIA. - THROMBOCYTOPENIA. Diagnosis Note The bone marrow is slightly hypocellular for age with trilineage hematopoiesis and non-specific myeloid changes likely related to previous therapy. Plasma cells are increased in number representing 8% of all cells in the aspirate, but with lack of large aggregates or sheets in the clot and biopsy sections. Immunohistochemical stains highlight the slightly increased plasma cell component in the marrow which shows kappa light chain restriction consistent with residual/recurrent plasma cell neoplasm. Correlation with cytogenetic and FISH studies recommended    09/17/2016 Pathology Results    Normal bone marrow cytogenetics and FISH     REVIEW OF SYSTEMS:   Constitutional: Denies fevers, chills or abnormal weight loss Eyes: Denies blurriness of vision Ears, nose, mouth, throat, and face: Denies mucositis or sore throat Respiratory: Denies cough, dyspnea or wheezes Cardiovascular: Denies palpitation, chest discomfort or lower extremity swelling Gastrointestinal:  Denies nausea, heartburn or change in bowel habits Skin: Denies abnormal skin rashes Lymphatics: Denies new lymphadenopathy or easy bruising Neurological:Denies numbness, tingling or new weaknesses Behavioral/Psych: Mood is stable, no new changes  All other systems were reviewed with the patient and are negative.  I have reviewed the past medical history, past surgical history, social history and family history with the patient and they are unchanged from previous note.  ALLERGIES:  is allergic to hydromorphone.  MEDICATIONS:  Current Outpatient Medications  Medication Sig Dispense Refill  . albuterol (PROVENTIL HFA;VENTOLIN HFA) 108 (90  Base) MCG/ACT inhaler Inhale 2 puffs into the lungs every 4 (four) hours as needed for wheezing or shortness of breath. 1 Inhaler 2  . aspirin 81 MG tablet Take 81 mg by mouth.    . cholecalciferol (VITAMIN D) 1000 UNITS tablet Take 1,000 Units by mouth daily.    . Multiple Vitamins-Minerals (ONE-A-DAY 50 PLUS PO) Take by mouth daily.    Marland Kitchen oxycodone (OXY-IR) 5 MG capsule Take 5 mg by mouth every 4 (four) hours as needed. pain    . Oxycodone HCl (OXYCONTIN) 60 MG TB12 Take 1 tablet by mouth 2 (two) times daily.    . pantoprazole (PROTONIX) 40 MG tablet TAKE (1) TABLET DAILY AS NEEDED. 30 tablet 0  . pomalidomide (POMALYST) 3 MG capsule TAKE 1 CAPSULE BY MOUTH ONCE DAILY FOR 21 DAYS ON AND 7 DAYS OFF 21 capsule 0  . valACYclovir (VALTREX) 1000 MG tablet Take 1 tablet (1,000 mg total) by mouth daily. 90 tablet 11   No current facility-administered medications for this visit.     PHYSICAL EXAMINATION: ECOG PERFORMANCE STATUS: 1 - Symptomatic but completely ambulatory  Vitals:   03/30/18 1225  BP: 130/87  Pulse: (!) 114  Resp: 18  Temp: 98 F (36.7 C)  SpO2: 100%   Filed Weights   03/30/18 1225  Weight: 121 lb 9.6 oz (55.2 kg)    GENERAL:alert, no distress and comfortable NEURO: alert & oriented x 3 with fluent speech, no focal motor/sensory deficits  LABORATORY DATA:  I have reviewed the data as listed    Component Value Date/Time   NA 140 03/05/2018 0801   NA 140 03/11/2017 1150   K 4.1 03/05/2018 0801   K 3.9 03/11/2017 1150   CL 105 03/05/2018 0801   CL 107 08/30/2012 1055   CO2 27 03/05/2018 0801   CO2 29 03/11/2017 1150   GLUCOSE 91 03/05/2018 0801   GLUCOSE 93 03/11/2017 1150   GLUCOSE 102 (H) 08/30/2012 1055   BUN 13 03/05/2018 0801   BUN 12.5 03/11/2017 1150   CREATININE 0.78 03/05/2018 0801   CREATININE 0.82 02/11/2018 1256   CREATININE 0.8 03/11/2017 1150   CALCIUM 8.7 (L) 03/05/2018 0801   CALCIUM 8.7 03/11/2017 1150   PROT 5.6 (L) 03/05/2018 0801   PROT  6.0 (L) 03/11/2017 1150   ALBUMIN 3.8 03/05/2018 0801   ALBUMIN 3.8 03/11/2017 1150   AST 19 03/05/2018 0801   AST 18 02/11/2018 1256   AST 18 03/11/2017 1150   ALT 15 03/05/2018 0801   ALT 13 02/11/2018 1256   ALT 14 03/11/2017 1150   ALKPHOS 45 03/05/2018 0801   ALKPHOS 45 03/11/2017 1150   BILITOT 0.5 03/05/2018 0801   BILITOT 0.7 02/11/2018 1256   BILITOT 0.37 03/11/2017 1150   GFRNONAA >60 03/05/2018 0801   GFRNONAA >60 02/11/2018 1256   GFRAA >60 03/05/2018 0801   GFRAA >60 02/11/2018 1256    No results found for: SPEP, UPEP  Lab Results  Component Value Date   WBC 4.6 03/05/2018   NEUTROABS 2.1 03/05/2018   HGB 11.3 (L) 03/05/2018   HCT 34.7 (L) 03/05/2018   MCV 106.4 (H) 03/05/2018   PLT 125 (L) 03/05/2018      Chemistry      Component Value Date/Time   NA 140 03/05/2018 0801   NA 140 03/11/2017 1150   K 4.1 03/05/2018 0801   K 3.9 03/11/2017 1150   CL 105 03/05/2018 0801   CL 107 08/30/2012 1055   CO2 27 03/05/2018 0801   CO2 29 03/11/2017 1150   BUN 13 03/05/2018 0801   BUN 12.5 03/11/2017 1150   CREATININE 0.78 03/05/2018 0801   CREATININE 0.82 02/11/2018 1256   CREATININE 0.8 03/11/2017 1150      Component Value Date/Time   CALCIUM 8.7 (L) 03/05/2018 0801   CALCIUM 8.7 03/11/2017 1150   ALKPHOS 45 03/05/2018 0801   ALKPHOS 45 03/11/2017 1150   AST 19 03/05/2018 0801   AST 18 02/11/2018 1256   AST 18 03/11/2017 1150   ALT 15 03/05/2018 0801   ALT 13 02/11/2018 1256   ALT 14 03/11/2017 1150   BILITOT 0.5 03/05/2018 0801   BILITOT 0.7 02/11/2018 1256   BILITOT 0.37 03/11/2017 1150       All questions were answered. The patient knows to call the clinic with any problems, questions or concerns. No barriers to learning was detected.  I spent 15 minutes counseling the patient face to face. The total time spent in the appointment was 20 minutes and more than 50% was on counseling and review of test results  [name], MD 03/30/2018 4:52  PM

## 2018-04-02 ENCOUNTER — Inpatient Hospital Stay: Payer: 59

## 2018-04-02 VITALS — BP 130/67 | HR 83 | Temp 98.3°F | Resp 17

## 2018-04-02 DIAGNOSIS — Z5112 Encounter for antineoplastic immunotherapy: Secondary | ICD-10-CM | POA: Diagnosis not present

## 2018-04-02 DIAGNOSIS — C9002 Multiple myeloma in relapse: Secondary | ICD-10-CM

## 2018-04-02 DIAGNOSIS — Z95828 Presence of other vascular implants and grafts: Secondary | ICD-10-CM

## 2018-04-02 LAB — CBC WITH DIFFERENTIAL/PLATELET
ABS IMMATURE GRANULOCYTES: 0.01 10*3/uL (ref 0.00–0.07)
Basophils Absolute: 0.2 10*3/uL — ABNORMAL HIGH (ref 0.0–0.1)
Basophils Relative: 6 %
Eosinophils Absolute: 0.3 10*3/uL (ref 0.0–0.5)
Eosinophils Relative: 12 %
HCT: 35.3 % — ABNORMAL LOW (ref 36.0–46.0)
Hemoglobin: 11.8 g/dL — ABNORMAL LOW (ref 12.0–15.0)
Immature Granulocytes: 0 %
LYMPHS PCT: 39 %
Lymphs Abs: 1 10*3/uL (ref 0.7–4.0)
MCH: 35.4 pg — ABNORMAL HIGH (ref 26.0–34.0)
MCHC: 33.4 g/dL (ref 30.0–36.0)
MCV: 106 fL — ABNORMAL HIGH (ref 80.0–100.0)
Monocytes Absolute: 0.6 10*3/uL (ref 0.1–1.0)
Monocytes Relative: 23 %
Neutro Abs: 0.5 10*3/uL — ABNORMAL LOW (ref 1.7–7.7)
Neutrophils Relative %: 20 %
Platelets: 112 10*3/uL — ABNORMAL LOW (ref 150–400)
RBC: 3.33 MIL/uL — AB (ref 3.87–5.11)
RDW: 14.1 % (ref 11.5–15.5)
WBC: 2.7 10*3/uL — AB (ref 4.0–10.5)
nRBC: 0 % (ref 0.0–0.2)

## 2018-04-02 LAB — COMPREHENSIVE METABOLIC PANEL
ALT: 15 U/L (ref 0–44)
AST: 25 U/L (ref 15–41)
Albumin: 4.1 g/dL (ref 3.5–5.0)
Alkaline Phosphatase: 48 U/L (ref 38–126)
Anion gap: 9 (ref 5–15)
BUN: 12 mg/dL (ref 6–20)
CHLORIDE: 105 mmol/L (ref 98–111)
CO2: 27 mmol/L (ref 22–32)
Calcium: 8.8 mg/dL — ABNORMAL LOW (ref 8.9–10.3)
Creatinine, Ser: 0.79 mg/dL (ref 0.44–1.00)
GFR calc Af Amer: >60 mL/min (ref >60)
GFR calc non Af Amer: >60 mL/min (ref >60)
Glucose, Bld: 89 mg/dL (ref 70–99)
Potassium: 3.9 mmol/L (ref 3.5–5.1)
Sodium: 141 mmol/L (ref 135–145)
Total Bilirubin: 0.5 mg/dL (ref 0.3–1.2)
Total Protein: 6 g/dL — ABNORMAL LOW (ref 6.5–8.1)

## 2018-04-02 MED ORDER — DIPHENHYDRAMINE HCL 25 MG PO CAPS
ORAL_CAPSULE | ORAL | Status: AC
  Filled 2018-04-02: qty 2

## 2018-04-02 MED ORDER — SODIUM CHLORIDE 0.9% FLUSH
10.0000 mL | INTRAVENOUS | Status: DC | PRN
  Administered 2018-04-02: 10 mL
  Filled 2018-04-02: qty 10

## 2018-04-02 MED ORDER — SODIUM CHLORIDE 0.9 % IV SOLN
15.2000 mg/kg | Freq: Once | INTRAVENOUS | Status: AC
  Administered 2018-04-02: 900 mg via INTRAVENOUS
  Filled 2018-04-02: qty 40

## 2018-04-02 MED ORDER — HEPARIN SOD (PORK) LOCK FLUSH 100 UNIT/ML IV SOLN
500.0000 [IU] | Freq: Once | INTRAVENOUS | Status: AC | PRN
  Administered 2018-04-02: 500 [IU]
  Filled 2018-04-02: qty 5

## 2018-04-02 MED ORDER — ACETAMINOPHEN 325 MG PO TABS
650.0000 mg | ORAL_TABLET | Freq: Once | ORAL | Status: AC
  Administered 2018-04-02: 650 mg via ORAL

## 2018-04-02 MED ORDER — ACETAMINOPHEN 325 MG PO TABS
ORAL_TABLET | ORAL | Status: AC
  Filled 2018-04-02: qty 2

## 2018-04-02 MED ORDER — DIPHENHYDRAMINE HCL 25 MG PO CAPS
50.0000 mg | ORAL_CAPSULE | Freq: Once | ORAL | Status: AC
  Administered 2018-04-02: 50 mg via ORAL

## 2018-04-02 MED ORDER — SODIUM CHLORIDE 0.9 % IV SOLN
20.0000 mg | Freq: Once | INTRAVENOUS | Status: AC
  Administered 2018-04-02: 20 mg via INTRAVENOUS
  Filled 2018-04-02: qty 2

## 2018-04-02 MED ORDER — PROCHLORPERAZINE MALEATE 10 MG PO TABS
ORAL_TABLET | ORAL | Status: AC
  Filled 2018-04-02: qty 1

## 2018-04-02 MED ORDER — PROCHLORPERAZINE MALEATE 10 MG PO TABS
10.0000 mg | ORAL_TABLET | Freq: Once | ORAL | Status: AC
  Administered 2018-04-02: 10 mg via ORAL

## 2018-04-02 MED ORDER — ZOLEDRONIC ACID 4 MG/100ML IV SOLN
4.0000 mg | Freq: Once | INTRAVENOUS | Status: AC
  Administered 2018-04-02: 4 mg via INTRAVENOUS
  Filled 2018-04-02: qty 100

## 2018-04-02 MED ORDER — SODIUM CHLORIDE 0.9 % IV SOLN
Freq: Once | INTRAVENOUS | Status: AC
  Administered 2018-04-02: 08:00:00 via INTRAVENOUS
  Filled 2018-04-02: qty 250

## 2018-04-02 NOTE — Progress Notes (Signed)
Per Dr Alvy Bimler ok to tx today with ANC 0.5 but to hold taking the pomalidomide  Until next visit with Dr Alvy Bimler.

## 2018-04-02 NOTE — Patient Instructions (Addendum)
[place] Discharge Instructions for Patients Receiving Chemotherapy  Today you received the following chemotherapy agents: Daratumumab (Darzalex)  Hold taking the: pomalidomide (POMALYST) 3 MG capsule Until your next visit with Dr Alvy Bimler.    To help prevent nausea and vomiting after your treatment, we encourage you to take your nausea medication as directed.    If you develop nausea and vomiting that is not controlled by your nausea medication, call the clinic.   BELOW ARE SYMPTOMS THAT SHOULD BE REPORTED IMMEDIATELY:  *FEVER GREATER THAN 100.5 F  *CHILLS WITH OR WITHOUT FEVER  NAUSEA AND VOMITING THAT IS NOT CONTROLLED WITH YOUR NAUSEA MEDICATION  *UNUSUAL SHORTNESS OF BREATH  *UNUSUAL BRUISING OR BLEEDING  TENDERNESS IN MOUTH AND THROAT WITH OR WITHOUT PRESENCE OF ULCERS  *URINARY PROBLEMS  *BOWEL PROBLEMS  UNUSUAL RASH Items with * indicate a potential emergency and should be followed up as soon as possible.  Feel free to call the clinic should you have any questions or concerns. The clinic phone number is [phone].  Please show the [place] at check-in to the Emergency Department and triage nurse.   Neutropenia Neutropenia is a condition that occurs when you have a lower-than-normal level of a type of white blood cell (neutrophil) in your body. Neutrophils are made in the spongy center of large bones (bone marrow) and they fight infections. Neutrophils are your body's main defense against bacterial and fungal infections. The fewer neutrophils you have and the longer your body remains without them, the greater your risk of getting a severe infection. What are the causes? This condition can occur if your body uses up or destroys neutrophils faster than your bone marrow can make them. This problem may happen because of:  Bacterial or fungal infection.  Allergic disorders.  Reactions to some medicines.  Autoimmune disease.  An  enlarged spleen. This condition can also occur if your bone marrow does not produce enough neutrophils. This problem may be caused by:  Cancer.  Cancer treatments, such as radiation or chemotherapy.  Viral infections.  Medicines, such as phenytoin.  Vitamin B12 deficiency.  Diseases of the bone marrow.  Environmental toxins, such as insecticides. What are the signs or symptoms? This condition does not usually cause symptoms. If symptoms are present, they are usually caused by an underlying infection. Symptoms of an infection may include:  Fever.  Chills.  Swollen glands.  Oral or anal ulcers.  Cough and shortness of breath.  Rash.  Skin infection.  Fatigue. How is this diagnosed? Your health care provider may suspect neutropenia if you have:  A condition that may cause neutropenia.  Symptoms of infection, especially fever.  Frequent and unusual infections. You will have a medical history and physical exam. Tests will also be done, such as:  A complete blood count (CBC).  A procedure to collect a sample of bone marrow for examination (bone marrow biopsy).  A chest X-ray.  A urine culture.  A blood culture. How is this treated? Treatment depends on the underlying cause and severity of your condition. Mild neutropenia may not require treatment. Treatment may include medicines, such as:  Antibiotic medicine given through an IV tube.  Antiviral medicines.  Antifungal medicines.  A medicine to increase neutrophil production (colony-stimulating factor). You may get this drug through an IV tube or by injection.  Steroids given through an IV tube. If an underlying condition is causing neutropenia, you may need treatment for that condition. If medicines you are  taking are causing neutropenia, your health care provider may have you stop taking those medicines. Follow these instructions at home: Medicines   Take over-the-counter and prescription medicines  only as told by your health care provider.  Get a seasonal flu shot (influenza vaccine). Lifestyle  Do not eat unpasteurized foods.Do not eat unwashed raw fruits or vegetables.  Avoid exposure to groups of people or children.  Avoid being around people who are sick.  Avoid being around dirt or dust, such as in construction areas or gardens.  Do not provide direct care for pets. Avoid animal droppings. Do not clean litter boxes and bird cages. Hygiene   Bathe daily.  Clean the area between the genitals and the anus (perineal area) after you urinate or have a bowel movement. If you are female, wipe from front to back.  Brush your teeth with a soft toothbrush before and after meals.  Do not use a razor that has a blade. Use an electric razor to remove hair.  Wash your hands often. Make sure others who come in contact with you also wash their hands. If soap and water are not available, use hand sanitizer. General instructions  Do not have sex unless your health care provider has approved.  Take actions to avoid cuts and burns. For example: ? Be cautious when you use knives. Always cut away from yourself. ? Keep knives in protective sheaths or guards when not in use. ? Use oven mitts when you cook with a hot stove, oven, or grill. ? Stand a safe distance away from open fires.  Avoid people who received a vaccine in the past 30 days if that vaccine contained a live version of the germ (live vaccine). You should not get a live vaccine. Common live vaccines are varicella, measles, mumps, and rubella.  Do not share food utensils.  Do not use tampons, enemas, or rectal suppositories unless your health care provider has approved.  Keep all appointments as told by your health care provider. This is important. Contact a health care provider if:  You have a fever.  You have chills or you start to shake.  You have: ? A sore throat. ? A warm, red, or tender area on your skin. ? A  cough. ? Frequent or painful urination. ? Vaginal discharge or itching.  You develop: ? Sores in your mouth or anus. ? Swollen lymph nodes. ? Red streaks on the skin. ? A rash.  You feel: ? Nauseous or you vomit. ? Very fatigued. ? Short of breath. This information is not intended to replace advice given to you by your health care provider. Make sure you discuss any questions you have with your health care provider. Document Released: 08/16/2001 Document Revised: 10/21/2017 Document Reviewed: 09/06/2014 Elsevier Interactive Patient Education  2019 Elsevier Inc.   Zoledronic Acid injection (Hypercalcemia, Oncology) (Zometa) What is this medicine? ZOLEDRONIC ACID (ZOE le dron ik AS id) lowers the amount of calcium loss from bone. It is used to treat too much calcium in your blood from cancer. It is also used to prevent complications of cancer that has spread to the bone. This medicine may be used for other purposes; ask your health care provider or pharmacist if you have questions. COMMON BRAND NAME(S): Zometa What should I tell my health care provider before I take this medicine? They need to know if you have any of these conditions: -aspirin-sensitive asthma -cancer, especially if you are receiving medicines used to treat cancer -dental disease  or wear dentures -infection -kidney disease -receiving corticosteroids like dexamethasone or prednisone -an unusual or allergic reaction to zoledronic acid, other medicines, foods, dyes, or preservatives -pregnant or trying to get pregnant -breast-feeding How should I use this medicine? This medicine is for infusion into a vein. It is given by a health care professional in a hospital or clinic setting. Talk to your pediatrician regarding the use of this medicine in children. Special care may be needed. Overdosage: If you think you have taken too much of this medicine contact a poison control center or emergency room at once. NOTE: This  medicine is only for you. Do not share this medicine with others. What if I miss a dose? It is important not to miss your dose. Call your doctor or health care professional if you are unable to keep an appointment. What may interact with this medicine? -certain antibiotics given by injection -NSAIDs, medicines for pain and inflammation, like ibuprofen or naproxen -some diuretics like bumetanide, furosemide -teriparatide -thalidomide This list may not describe all possible interactions. Give your health care provider a list of all the medicines, herbs, non-prescription drugs, or dietary supplements you use. Also tell them if you smoke, drink alcohol, or use illegal drugs. Some items may interact with your medicine. What should I watch for while using this medicine? Visit your doctor or health care professional for regular checkups. It may be some time before you see the benefit from this medicine. Do not stop taking your medicine unless your doctor tells you to. Your doctor may order blood tests or other tests to see how you are doing. Women should inform their doctor if they wish to become pregnant or think they might be pregnant. There is a potential for serious side effects to an unborn child. Talk to your health care professional or pharmacist for more information. You should make sure that you get enough calcium and vitamin D while you are taking this medicine. Discuss the foods you eat and the vitamins you take with your health care professional. Some people who take this medicine have severe bone, joint, and/or muscle pain. This medicine may also increase your risk for jaw problems or a broken thigh bone. Tell your doctor right away if you have severe pain in your jaw, bones, joints, or muscles. Tell your doctor if you have any pain that does not go away or that gets worse. Tell your dentist and dental surgeon that you are taking this medicine. You should not have major dental surgery while on  this medicine. See your dentist to have a dental exam and fix any dental problems before starting this medicine. Take good care of your teeth while on this medicine. Make sure you see your dentist for regular follow-up appointments. What side effects may I notice from receiving this medicine? Side effects that you should report to your doctor or health care professional as soon as possible: -allergic reactions like skin rash, itching or hives, swelling of the face, lips, or tongue -anxiety, confusion, or depression -breathing problems -changes in vision -eye pain -feeling faint or lightheaded, falls -jaw pain, especially after dental work -mouth sores -muscle cramps, stiffness, or weakness -redness, blistering, peeling or loosening of the skin, including inside the mouth -trouble passing urine or change in the amount of urine Side effects that usually do not require medical attention (report to your doctor or health care professional if they continue or are bothersome): -bone, joint, or muscle pain -constipation -diarrhea -fever -hair loss -irritation  at site where injected -loss of appetite -nausea, vomiting -stomach upset -trouble sleeping -trouble swallowing -weak or tired This list may not describe all possible side effects. Call your doctor for medical advice about side effects. You may report side effects to FDA at 1-800-FDA-1088. Where should I keep my medicine? This drug is given in a hospital or clinic and will not be stored at home. NOTE: This sheet is a summary. It may not cover all possible information. If you have questions about this medicine, talk to your doctor, pharmacist, or health care provider.  2019 Elsevier/Gold Standard (2013-07-23 14:19:39)

## 2018-04-05 LAB — UPEP/UIFE/LIGHT CHAINS/TP, 24-HR UR
% BETA, Urine: 0 %
ALBUMIN, U: 100 %
ALPHA 1 URINE: 0 %
Alpha 2, Urine: 0 %
Free Kappa Lt Chains,Ur: 60.86 mg/L (ref 0.63–113.79)
Free Kappa/Lambda Ratio: >84.53 — ABNORMAL HIGH (ref 1.03–31.76)
Free Lambda Lt Chains,Ur: <0.72 mg/L (ref 0.47–11.77)
GAMMA GLOBULIN URINE: 0 %
TOTAL PROTEIN, URINE-UR/DAY: 78 mg/(24.h) (ref 30–150)
Total Protein, Urine: 4.7 mg/dL
Total Volume: 1650

## 2018-04-05 LAB — KAPPA/LAMBDA LIGHT CHAINS
Kappa free light chain: 108.2 mg/L — ABNORMAL HIGH (ref 3.3–19.4)
Kappa, lambda light chain ratio: 49.18 — ABNORMAL HIGH (ref 0.26–1.65)
Lambda free light chains: 2.2 mg/L — ABNORMAL LOW (ref 5.7–26.3)

## 2018-04-05 LAB — MULTIPLE MYELOMA PANEL, SERUM
ALBUMIN SERPL ELPH-MCNC: 4 g/dL (ref 2.9–4.4)
Albumin/Glob SerPl: 2.6 — ABNORMAL HIGH (ref 0.7–1.7)
Alpha 1: 0.2 g/dL (ref 0.0–0.4)
Alpha2 Glob SerPl Elph-Mcnc: 0.5 g/dL (ref 0.4–1.0)
B-Globulin SerPl Elph-Mcnc: 0.7 g/dL (ref 0.7–1.3)
GAMMA GLOB SERPL ELPH-MCNC: 0.2 g/dL — AB (ref 0.4–1.8)
Globulin, Total: 1.6 g/dL — ABNORMAL LOW (ref 2.2–3.9)
IgA: 9 mg/dL — ABNORMAL LOW (ref 87–352)
IgG (Immunoglobin G), Serum: 273 mg/dL — ABNORMAL LOW (ref 700–1600)
IgM (Immunoglobulin M), Srm: 10 mg/dL — ABNORMAL LOW (ref 26–217)
M Protein SerPl Elph-Mcnc: 0.1 g/dL — ABNORMAL HIGH
Total Protein ELP: 5.6 g/dL — ABNORMAL LOW (ref 6.0–8.5)

## 2018-04-06 ENCOUNTER — Telehealth: Payer: Self-pay | Admitting: Hematology and Oncology

## 2018-04-06 ENCOUNTER — Encounter (HOSPITAL_COMMUNITY)
Admission: RE | Admit: 2018-04-06 | Discharge: 2018-04-06 | Disposition: A | Discharge status: Home / Self Care | Payer: 59 | Source: Ambulatory Visit | Attending: Hematology and Oncology | Admitting: Hematology and Oncology

## 2018-04-06 DIAGNOSIS — C9002 Multiple myeloma in relapse: Secondary | ICD-10-CM | POA: Diagnosis present

## 2018-04-06 LAB — GLUCOSE, CAPILLARY: GLUCOSE-CAPILLARY: 87 mg/dL (ref 70–99)

## 2018-04-06 MED ORDER — FLUDEOXYGLUCOSE F - 18 (FDG) INJECTION
6.3000 | Freq: Once | INTRAVENOUS | Status: AC
  Administered 2018-04-06: 6.3 via INTRAVENOUS

## 2018-04-06 NOTE — Telephone Encounter (Signed)
Scheduled appt per 1/27 sch message - left message for patient with appt date and time

## 2018-04-13 ENCOUNTER — Telehealth: Payer: Self-pay | Admitting: Hematology and Oncology

## 2018-04-13 ENCOUNTER — Other Ambulatory Visit: Payer: Self-pay | Admitting: Hematology and Oncology

## 2018-04-13 ENCOUNTER — Inpatient Hospital Stay: Payer: 59 | Attending: Hematology and Oncology | Admitting: Hematology and Oncology

## 2018-04-13 ENCOUNTER — Encounter: Payer: Self-pay | Admitting: Hematology and Oncology

## 2018-04-13 ENCOUNTER — Inpatient Hospital Stay: Payer: 59

## 2018-04-13 ENCOUNTER — Telehealth: Payer: Self-pay

## 2018-04-13 VITALS — BP 142/82 | HR 100 | Temp 98.0°F | Resp 17 | Ht 59.0 in | Wt 121.8 lb

## 2018-04-13 DIAGNOSIS — H1131 Conjunctival hemorrhage, right eye: Secondary | ICD-10-CM | POA: Diagnosis not present

## 2018-04-13 DIAGNOSIS — D61810 Antineoplastic chemotherapy induced pancytopenia: Secondary | ICD-10-CM | POA: Diagnosis not present

## 2018-04-13 DIAGNOSIS — Z9221 Personal history of antineoplastic chemotherapy: Secondary | ICD-10-CM | POA: Diagnosis not present

## 2018-04-13 DIAGNOSIS — R1900 Intra-abdominal and pelvic swelling, mass and lump, unspecified site: Secondary | ICD-10-CM | POA: Insufficient documentation

## 2018-04-13 DIAGNOSIS — T451X5A Adverse effect of antineoplastic and immunosuppressive drugs, initial encounter: Secondary | ICD-10-CM | POA: Diagnosis not present

## 2018-04-13 DIAGNOSIS — K639 Disease of intestine, unspecified: Secondary | ICD-10-CM

## 2018-04-13 DIAGNOSIS — C9002 Multiple myeloma in relapse: Secondary | ICD-10-CM | POA: Diagnosis present

## 2018-04-13 DIAGNOSIS — Z79899 Other long term (current) drug therapy: Secondary | ICD-10-CM | POA: Insufficient documentation

## 2018-04-13 LAB — CBC WITH DIFFERENTIAL/PLATELET
Abs Immature Granulocytes: 0 10*3/uL (ref 0.00–0.07)
BASOS PCT: 5 %
Basophils Absolute: 0.2 10*3/uL — ABNORMAL HIGH (ref 0.0–0.1)
EOS ABS: 0.2 10*3/uL (ref 0.0–0.5)
Eosinophils Relative: 5 %
HCT: 37.9 % (ref 36.0–46.0)
Hemoglobin: 12.5 g/dL (ref 12.0–15.0)
Immature Granulocytes: 0 %
Lymphocytes Relative: 37 %
Lymphs Abs: 1.3 10*3/uL (ref 0.7–4.0)
MCH: 35.2 pg — ABNORMAL HIGH (ref 26.0–34.0)
MCHC: 33 g/dL (ref 30.0–36.0)
MCV: 106.8 fL — ABNORMAL HIGH (ref 80.0–100.0)
Monocytes Absolute: 0.6 10*3/uL (ref 0.1–1.0)
Monocytes Relative: 19 %
Neutro Abs: 1.1 10*3/uL — ABNORMAL LOW (ref 1.7–7.7)
Neutrophils Relative %: 34 %
Platelets: 141 10*3/uL — ABNORMAL LOW (ref 150–400)
RBC: 3.55 MIL/uL — ABNORMAL LOW (ref 3.87–5.11)
RDW: 13.7 % (ref 11.5–15.5)
WBC: 3.4 10*3/uL — ABNORMAL LOW (ref 4.0–10.5)
nRBC: 0 % (ref 0.0–0.2)

## 2018-04-13 NOTE — Assessment & Plan Note (Signed)
She has mild right conjunctival hemorrhage, spontaneous Her vision is not affected Repeat CBC showed no significant thrombocytopenia.  Recommend observation only

## 2018-04-13 NOTE — Telephone Encounter (Signed)
Called per Dr. Alvy Bimler and given CBC results. Instructed to continue Pomalyst 3 mg, Rx sent to pharmcy. [name], husband verbalized understanding.

## 2018-04-13 NOTE — Assessment & Plan Note (Signed)
She has persistent lesion in her colon The patient has not had screening colonoscopy due to recurrent treatment for multiple myeloma I recommend gastroenterologist referral and she agreed to proceed

## 2018-04-13 NOTE — Assessment & Plan Note (Signed)
The lesion seen in her uterus likely represent uterine polyp.   We discussed the risk and benefit of pelvic ultrasound.  For now, she would like to be observed only

## 2018-04-13 NOTE — Assessment & Plan Note (Signed)
I have reviewed PET CT scan, blood work and 24-hour urine collection with the patient and her husband Repeat PET scan showed no evidence of new bone lesion Her serum light chains and urine study confirm slight disease progression She has persistent pancytopenia due to treatment We discussed treatment options and have reviewed recommendation from [facility] and her last visit at [facility] I will reach out to her hematologist at [facility] to see if clinical trials are available We discussed the plan for now will be to proceed with monthly daratumumab along with pomalidomide, with dose adjustment as needed to her pancytopenia

## 2018-04-13 NOTE — Telephone Encounter (Signed)
pls refill 

## 2018-04-13 NOTE — Progress Notes (Signed)
[facility] OFFICE PROGRESS NOTE  Patient Care Team: [name], MD as PCP - General [name], MD as Consulting Physician (Oncology) [name], MD as Consulting Physician (Neurosurgery) [name], MD as Consulting Physician (Hematology and Oncology) [name], [name], MD as Referring Physician (Hematology and Oncology)  ASSESSMENT & PLAN:  Multiple myeloma in relapse [facility]) I have reviewed PET CT scan, blood work and 24-hour urine collection with the patient and her husband Repeat PET scan showed no evidence of new bone lesion Her serum light chains and urine study confirm slight disease progression She has persistent pancytopenia due to treatment We discussed treatment options and have reviewed recommendation from [facility] and her last visit at [facility] I will reach out to her hematologist at [facility] to see if clinical trials are available We discussed the plan for now will be to proceed with monthly daratumumab along with pomalidomide, with dose adjustment as needed to her pancytopenia  Pancytopenia due to antineoplastic chemotherapy [facility]) She has intermittent pancytopenia due to treatment For now, she will remain on 3 mg of pomalidomide If her pancytopenia is worse, we will consider reducing the dose to 2 mg dose  Lesion of colon She has persistent lesion in her colon The patient has not had screening colonoscopy due to recurrent treatment for multiple myeloma I recommend gastroenterologist referral and she agreed to proceed  Pelvic mass in female The lesion seen in her uterus likely represent uterine polyp.   We discussed the risk and benefit of pelvic ultrasound.  For now, she would like to be observed only  Conjunctival hemorrhage, right She has mild right conjunctival hemorrhage, spontaneous Her vision is not affected Repeat CBC showed no significant thrombocytopenia.   Recommend observation only   Orders Placed This Encounter  Procedures  . Ambulatory referral to Gastroenterology    Referral Priority:   Routine    Referral Type:   Consultation    Referral Reason:   Specialty Services Required    Referred to Provider:   Arta Silence, MD    Number of Visits Requested:   1    INTERVAL HISTORY: Please see below for problem oriented charting. She returns with her husband to review test results Since last time I saw her, she noted spontaneous conjunctiva hemorrhage over the right eye but it does not affect her vision She denies excessive bruising No recent fever or chills Denies recent changes in bowel habits or abnormal uterine bleeding.  No new bone pain.  Her chronic back pain is stable with current prescription pain medicine  SUMMARY OF ONCOLOGIC HISTORY:   Multiple myeloma in relapse ([place])   07/21/2007 Bone Marrow Biopsy    Case #: [id number] Bone marrow showed myeloma    07/21/2007 Miscellaneous    She was diagnosed in May 2009. 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 Treatment is stopped due to minimum response and pancytopenia    10/19/2015 -  Chemotherapy    The patient had Velcade for chemotherapy treatment along with weekly Daratumumab. Last dose of Velcade on  02/29/16, stopped due to progression. Pomalidomide added on 03/14/16    04/11/2016 Adverse Reaction    Delay resumption of Pomalyst cycle 2 until 04/16/16 due to neutropenia    09/16/2016 PET scan    1. No findings of active bony lymphoma. Prior activity in the sternum and thoracic spine has resolved. 2. Focal hypermetabolic activity along the sigmoid colon with some local inflammatory stranding in the adjacent mesentery, maximum SUV 11.0. This is probably from mild acute diverticulitis. There is no hypermetabolic activity in this vicinity 4 months ago to suggest that this is from colon cancer. Correlate with any symptoms. 3. Acute right maxillary sinusitis. 4. Thoracolumbar compression fractures, vertebral augmentation at the L1 level.    09/17/2016 Procedure    CT guided bone marrow biopsy of right posterior iliac bone with both aspirate and core samples obtained.    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 - TRILINEAGE HEMATOPOIESIS. - SEE COMMENT. PERIPHERAL BLOOD: - LYMPHOPENIA. - THROMBOCYTOPENIA. Diagnosis Note The bone marrow is slightly hypocellular for age with trilineage hematopoiesis and non-specific myeloid changes likely related to previous therapy. Plasma cells are increased in number representing 8% of all cells in the aspirate, but with lack of large aggregates or sheets in the clot and biopsy sections. Immunohistochemical stains highlight the slightly increased plasma cell component in the marrow which shows kappa light chain restriction consistent with residual/recurrent plasma cell neoplasm. Correlation with cytogenetic and FISH studies recommended    09/17/2016 Pathology Results    Normal bone marrow cytogenetics and FISH    04/06/2018 PET scan    1. No abnormal focus of increased radiotracer uptake identified to suggest active disease or progression of  disease. 2. Similar appearance of thoracic and lumbar compression deformities with associated kyphosis deformity. 3. Thickening of the endometrium is again noted. This is indeterminate but is considered abnormal in a postmenopausal female. Consider further evaluation with pelvic sonogram.      REVIEW OF SYSTEMS:   Constitutional: Denies fevers, chills or abnormal weight loss Eyes: Denies blurriness of vision Ears, nose, mouth, throat, and face: Denies mucositis or sore throat Respiratory: Denies cough, dyspnea or wheezes Cardiovascular: Denies palpitation, chest discomfort or lower extremity swelling Gastrointestinal:  Denies nausea, heartburn or change in bowel habits Skin: Denies abnormal skin rashes Lymphatics: Denies new lymphadenopathy or easy bruising Neurological:Denies numbness, tingling or new weaknesses Behavioral/Psych: Mood is stable, no new changes  All other systems were reviewed with the patient and are negative.  I have reviewed the past medical history, past surgical history, social history and family history with the patient and they are unchanged from previous note.  ALLERGIES:  is allergic to hydromorphone.  MEDICATIONS:  Current Outpatient Medications  Medication Sig Dispense Refill  . albuterol (PROVENTIL HFA;VENTOLIN HFA) 108 (90 Base) MCG/ACT inhaler Inhale 2 puffs into the lungs every 4 (four) hours as needed for wheezing or shortness of breath. 1 Inhaler 2  . aspirin 81 MG tablet Take 81 mg by mouth.    Marland Kitchen  cholecalciferol (VITAMIN D) 1000 UNITS tablet Take 1,000 Units by mouth daily.    . Multiple Vitamins-Minerals (ONE-A-DAY 50 PLUS PO) Take by mouth daily.    Marland Kitchen oxycodone (OXY-IR) 5 MG capsule Take 5 mg by mouth every 4 (four) hours as needed. pain    . Oxycodone HCl (OXYCONTIN) 60 MG TB12 Take 1 tablet by mouth 2 (two) times daily.    . pantoprazole (PROTONIX) 40 MG tablet TAKE (1) TABLET DAILY AS NEEDED. 30 tablet 0  . POMALYST 3 MG capsule TAKE 1  CAPSULE BY MOUTH ONCE DAILY FOR 21 DAYS ON AND 7 DAYS OFF 21 capsule 0  . valACYclovir (VALTREX) 1000 MG tablet Take 1 tablet (1,000 mg total) by mouth daily. 90 tablet 11   No current facility-administered medications for this visit.     PHYSICAL EXAMINATION: ECOG PERFORMANCE STATUS: 1 - Symptomatic but completely ambulatory  Vitals:   04/13/18 1304  BP: (!) 142/82  Pulse: 100  Resp: 17  Temp: 98 F (36.7 C)  SpO2: 100%   Filed Weights   04/13/18 1304  Weight: 121 lb 12.8 oz (55.2 kg)    GENERAL:alert, no distress and comfortable SKIN: skin color, texture, turgor are normal, no rashes or significant lesions EYES: Noted mild conjunctiva hemorrhage affecting the medial portion of her right eye  NEURO: alert & oriented x 3 with fluent speech, no focal motor/sensory deficits  LABORATORY DATA:  I have reviewed the data as listed    Component Value Date/Time   NA 141 04/02/2018 0755   NA 140 03/11/2017 1150   K 3.9 04/02/2018 0755   K 3.9 03/11/2017 1150   CL 105 04/02/2018 0755   CL 107 08/30/2012 1055   CO2 27 04/02/2018 0755   CO2 29 03/11/2017 1150   GLUCOSE 89 04/02/2018 0755   GLUCOSE 93 03/11/2017 1150   GLUCOSE 102 (H) 08/30/2012 1055   BUN 12 04/02/2018 0755   BUN 12.5 03/11/2017 1150   CREATININE 0.79 04/02/2018 0755   CREATININE 0.82 02/11/2018 1256   CREATININE 0.8 03/11/2017 1150   CALCIUM 8.8 (L) 04/02/2018 0755   CALCIUM 8.7 03/11/2017 1150   PROT 6.0 (L) 04/02/2018 0755   PROT 6.0 (L) 03/11/2017 1150   ALBUMIN 4.1 04/02/2018 0755   ALBUMIN 3.8 03/11/2017 1150   AST 25 04/02/2018 0755   AST 18 02/11/2018 1256   AST 18 03/11/2017 1150   ALT 15 04/02/2018 0755   ALT 13 02/11/2018 1256   ALT 14 03/11/2017 1150   ALKPHOS 48 04/02/2018 0755   ALKPHOS 45 03/11/2017 1150   BILITOT 0.5 04/02/2018 0755   BILITOT 0.7 02/11/2018 1256   BILITOT 0.37 03/11/2017 1150   GFRNONAA >60 04/02/2018 0755   GFRNONAA >60 02/11/2018 1256   GFRAA >60 04/02/2018  0755   GFRAA >60 02/11/2018 1256    No results found for: SPEP, UPEP  Lab Results  Component Value Date   WBC 3.4 (L) 04/13/2018   NEUTROABS 1.1 (L) 04/13/2018   HGB 12.5 04/13/2018   HCT 37.9 04/13/2018   MCV 106.8 (H) 04/13/2018   PLT 141 (L) 04/13/2018      Chemistry      Component Value Date/Time   NA 141 04/02/2018 0755   NA 140 03/11/2017 1150   K 3.9 04/02/2018 0755   K 3.9 03/11/2017 1150   CL 105 04/02/2018 0755   CL 107 08/30/2012 1055   CO2 27 04/02/2018 0755   CO2 29 03/11/2017 1150   BUN 12  04/02/2018 0755   BUN 12.5 03/11/2017 1150   CREATININE 0.79 04/02/2018 0755   CREATININE 0.82 02/11/2018 1256   CREATININE 0.8 03/11/2017 1150      Component Value Date/Time   CALCIUM 8.8 (L) 04/02/2018 0755   CALCIUM 8.7 03/11/2017 1150   ALKPHOS 48 04/02/2018 0755   ALKPHOS 45 03/11/2017 1150   AST 25 04/02/2018 0755   AST 18 02/11/2018 1256   AST 18 03/11/2017 1150   ALT 15 04/02/2018 0755   ALT 13 02/11/2018 1256   ALT 14 03/11/2017 1150   BILITOT 0.5 04/02/2018 0755   BILITOT 0.7 02/11/2018 1256   BILITOT 0.37 03/11/2017 1150       RADIOGRAPHIC STUDIES: I have reviewed multiple imaging study with the patient and her husband I have personally reviewed the radiological images as listed and agreed with the findings in the report. Nm Pet Image Restage (ps) Whole Body  Result Date: 04/06/2018 CLINICAL DATA:  Subsequent treatment strategy for myeloma. EXAM: NUCLEAR MEDICINE PET WHOLE BODY TECHNIQUE: 6.3 mCi F-18 FDG was injected intravenously. Full-ring PET imaging was performed from the skull base to thigh after the radiotracer. CT data was obtained and used for attenuation correction and anatomic localization. Fasting blood glucose: 84 mg/dl COMPARISON:  09/16/2016 FINDINGS: Mediastinal blood pool activity: SUV max 2.67 HEAD/NECK: No hypermetabolic activity in the scalp. No hypermetabolic cervical lymph nodes. Incidental CT findings: none CHEST: No  hypermetabolic mediastinal, hilar, axillary or supraclavicular lymph nodes. No hypermetabolic pulmonary nodules. Scar like densities in the right middle lobe and lingula. Incidental CT findings: none ABDOMEN/PELVIS: No abnormal radiotracer activity identified within the liver, pancreas, adrenal glands, or spleen. No hypermetabolic abdominal or pelvic lymph nodes. Incidental CT findings: Posterior right lobe of liver cyst is unchanged. Mild aortic atherosclerosis. Increased attenuation within the uterine cavity is again noted which measures 1.9 cm in thickness. SKELETON: No focal hypermetabolic activity to suggest skeletal metastasis. Incidental CT findings: Treated compression fracture involving the L1 vertebra is again noted. Multiple additional compression fractures are noted within the thoracic and lumbar spine with marked thoracic kyphosis. EXTREMITIES: No abnormal hypermetabolic activity in the lower extremities. Incidental CT findings: none IMPRESSION: 1. No abnormal focus of increased radiotracer uptake identified to suggest active disease or progression of disease. 2. Similar appearance of thoracic and lumbar compression deformities with associated kyphosis deformity. 3. Thickening of the endometrium is again noted. This is indeterminate but is considered abnormal in a postmenopausal female. Consider further evaluation with pelvic sonogram. Electronically Signed   By: Kerby Moors M.D.   On: 04/06/2018 15:59    All questions were answered. The patient knows to call the clinic with any problems, questions or concerns. No barriers to learning was detected.  I spent 25 minutes counseling the patient face to face. The total time spent in the appointment was 40 minutes and more than 50% was on counseling and review of test results  [name], MD 04/13/2018 6:18 PM

## 2018-04-13 NOTE — Telephone Encounter (Signed)
Gave avs and calendar ° °

## 2018-04-13 NOTE — Assessment & Plan Note (Signed)
She has intermittent pancytopenia due to treatment For now, she will remain on 3 mg of pomalidomide If her pancytopenia is worse, we will consider reducing the dose to 2 mg dose 

## 2018-04-14 ENCOUNTER — Telehealth: Payer: Self-pay

## 2018-04-14 NOTE — Telephone Encounter (Signed)
Called referral and faxed referral to Dr. Lavetta Nielsen office.

## 2018-04-15 ENCOUNTER — Telehealth: Payer: Self-pay

## 2018-04-15 NOTE — Telephone Encounter (Signed)
Called and left a message for her to call the office.

## 2018-04-15 NOTE — Telephone Encounter (Signed)
He called earlier and left a message to call him.  Called back and left message asking him to call the office.

## 2018-04-16 ENCOUNTER — Telehealth: Payer: Self-pay | Admitting: *Deleted

## 2018-04-16 NOTE — Telephone Encounter (Signed)
Called & spoke with [name], pt's husband & informed that Dr Alvy Bimler has talked with Dr Rodriguez/WF & they should call regarding a f/u appt.  They have a clinical trial starting next month.  He expressed understanding & appreciation for call.

## 2018-04-30 ENCOUNTER — Telehealth: Payer: Self-pay | Admitting: Hematology and Oncology

## 2018-04-30 ENCOUNTER — Inpatient Hospital Stay: Payer: 59

## 2018-04-30 ENCOUNTER — Other Ambulatory Visit: Payer: 59

## 2018-04-30 ENCOUNTER — Inpatient Hospital Stay (HOSPITAL_BASED_OUTPATIENT_CLINIC_OR_DEPARTMENT_OTHER): Payer: 59 | Admitting: Hematology and Oncology

## 2018-04-30 ENCOUNTER — Ambulatory Visit: Payer: 59

## 2018-04-30 ENCOUNTER — Encounter: Payer: Self-pay | Admitting: Hematology and Oncology

## 2018-04-30 VITALS — BP 120/75 | HR 64 | Temp 97.8°F | Resp 16 | Wt 126.0 lb

## 2018-04-30 DIAGNOSIS — C9002 Multiple myeloma in relapse: Secondary | ICD-10-CM

## 2018-04-30 DIAGNOSIS — D61810 Antineoplastic chemotherapy induced pancytopenia: Secondary | ICD-10-CM

## 2018-04-30 DIAGNOSIS — T451X5A Adverse effect of antineoplastic and immunosuppressive drugs, initial encounter: Secondary | ICD-10-CM

## 2018-04-30 DIAGNOSIS — R1900 Intra-abdominal and pelvic swelling, mass and lump, unspecified site: Secondary | ICD-10-CM | POA: Diagnosis not present

## 2018-04-30 DIAGNOSIS — Z79899 Other long term (current) drug therapy: Secondary | ICD-10-CM

## 2018-04-30 DIAGNOSIS — Z9221 Personal history of antineoplastic chemotherapy: Secondary | ICD-10-CM

## 2018-04-30 DIAGNOSIS — K639 Disease of intestine, unspecified: Secondary | ICD-10-CM

## 2018-04-30 LAB — CBC WITH DIFFERENTIAL/PLATELET
Abs Immature Granulocytes: 0.01 10*3/uL (ref 0.00–0.07)
BASOS PCT: 2 %
Basophils Absolute: 0.1 10*3/uL (ref 0.0–0.1)
Eosinophils Absolute: 0.4 10*3/uL (ref 0.0–0.5)
Eosinophils Relative: 9 %
HCT: 36.1 % (ref 36.0–46.0)
Hemoglobin: 11.9 g/dL — ABNORMAL LOW (ref 12.0–15.0)
Immature Granulocytes: 0 %
Lymphocytes Relative: 26 %
Lymphs Abs: 1.2 10*3/uL (ref 0.7–4.0)
MCH: 35.3 pg — ABNORMAL HIGH (ref 26.0–34.0)
MCHC: 33 g/dL (ref 30.0–36.0)
MCV: 107.1 fL — ABNORMAL HIGH (ref 80.0–100.0)
Monocytes Absolute: 0.9 10*3/uL (ref 0.1–1.0)
Monocytes Relative: 20 %
NEUTROS PCT: 43 %
Neutro Abs: 1.9 10*3/uL (ref 1.7–7.7)
Platelets: 120 10*3/uL — ABNORMAL LOW (ref 150–400)
RBC: 3.37 MIL/uL — AB (ref 3.87–5.11)
RDW: 13.2 % (ref 11.5–15.5)
WBC: 4.5 10*3/uL (ref 4.0–10.5)
nRBC: 0 % (ref 0.0–0.2)

## 2018-04-30 LAB — COMPREHENSIVE METABOLIC PANEL
ALT: 12 U/L (ref 0–44)
AST: 20 U/L (ref 15–41)
Albumin: 3.9 g/dL (ref 3.5–5.0)
Alkaline Phosphatase: 54 U/L (ref 38–126)
Anion gap: 8 (ref 5–15)
BUN: 10 mg/dL (ref 6–20)
CO2: 26 mmol/L (ref 22–32)
Calcium: 8 mg/dL — ABNORMAL LOW (ref 8.9–10.3)
Chloride: 107 mmol/L (ref 98–111)
Creatinine, Ser: 0.75 mg/dL (ref 0.44–1.00)
GFR calc Af Amer: >60 mL/min (ref >60)
GFR calc non Af Amer: >60 mL/min (ref >60)
Glucose, Bld: 88 mg/dL (ref 70–99)
Potassium: 3.7 mmol/L (ref 3.5–5.1)
SODIUM: 141 mmol/L (ref 135–145)
Total Bilirubin: 0.4 mg/dL (ref 0.3–1.2)
Total Protein: 5.6 g/dL — ABNORMAL LOW (ref 6.5–8.1)

## 2018-04-30 MED ORDER — SODIUM CHLORIDE 0.9% FLUSH
10.0000 mL | INTRAVENOUS | Status: DC | PRN
  Administered 2018-04-30: 10 mL
  Filled 2018-04-30: qty 10

## 2018-04-30 MED ORDER — DIPHENHYDRAMINE HCL 25 MG PO CAPS
ORAL_CAPSULE | ORAL | Status: AC
  Filled 2018-04-30: qty 2

## 2018-04-30 MED ORDER — PROCHLORPERAZINE MALEATE 10 MG PO TABS
ORAL_TABLET | ORAL | Status: AC
  Filled 2018-04-30: qty 1

## 2018-04-30 MED ORDER — PROCHLORPERAZINE MALEATE 10 MG PO TABS
10.0000 mg | ORAL_TABLET | Freq: Once | ORAL | Status: AC
  Administered 2018-04-30: 10 mg via ORAL

## 2018-04-30 MED ORDER — SODIUM CHLORIDE 0.9 % IV SOLN
15.2000 mg/kg | Freq: Once | INTRAVENOUS | Status: AC
  Administered 2018-04-30: 900 mg via INTRAVENOUS
  Filled 2018-04-30: qty 40

## 2018-04-30 MED ORDER — ACETAMINOPHEN 325 MG PO TABS
650.0000 mg | ORAL_TABLET | Freq: Once | ORAL | Status: AC
  Administered 2018-04-30: 650 mg via ORAL

## 2018-04-30 MED ORDER — DIPHENHYDRAMINE HCL 25 MG PO CAPS
50.0000 mg | ORAL_CAPSULE | Freq: Once | ORAL | Status: AC
  Administered 2018-04-30: 50 mg via ORAL

## 2018-04-30 MED ORDER — HEPARIN SOD (PORK) LOCK FLUSH 100 UNIT/ML IV SOLN
500.0000 [IU] | Freq: Once | INTRAVENOUS | Status: AC | PRN
  Administered 2018-04-30: 500 [IU]
  Filled 2018-04-30: qty 5

## 2018-04-30 MED ORDER — ACETAMINOPHEN 325 MG PO TABS
ORAL_TABLET | ORAL | Status: AC
  Filled 2018-04-30: qty 2

## 2018-04-30 MED ORDER — SODIUM CHLORIDE 0.9 % IV SOLN
20.0000 mg | Freq: Once | INTRAVENOUS | Status: AC
  Administered 2018-04-30: 20 mg via INTRAVENOUS
  Filled 2018-04-30: qty 20

## 2018-04-30 MED ORDER — SODIUM CHLORIDE 0.9 % IV SOLN
Freq: Once | INTRAVENOUS | Status: AC
  Administered 2018-04-30: 09:00:00 via INTRAVENOUS
  Filled 2018-04-30: qty 250

## 2018-04-30 NOTE — Telephone Encounter (Signed)
No los °

## 2018-04-30 NOTE — Assessment & Plan Note (Signed)
She has persistent lesion in her colon The patient has not had screening colonoscopy due to recurrent treatment for multiple myeloma She has been referred to gastroenterologist, appointment pending

## 2018-04-30 NOTE — Assessment & Plan Note (Signed)
She has intermittent pancytopenia due to treatment For now, she will remain on 3 mg of pomalidomide If her pancytopenia is worse, we will consider reducing the dose to 2 mg dose 

## 2018-04-30 NOTE — Progress Notes (Signed)
[place] OFFICE PROGRESS NOTE  Patient Care Team: [name], MD as PCP - General [name], MD as Consulting Physician (Oncology) [name], MD as Consulting Physician (Neurosurgery) [name], MD as Consulting Physician (Hematology and Oncology) [name], [name], MD as Referring Physician (Hematology and Oncology)  ASSESSMENT & PLAN:  Multiple myeloma in relapse [facility]) Repeat PET scan showed no evidence of new bone lesion Her serum light chains and urine study confirmed slight disease progression She has persistent pancytopenia due to treatment She has appointment to see her hematologist at [facility] to discuss potential clinical trial We discussed the plan for now will be to proceed with monthly daratumumab along with pomalidomide, with dose adjustment as needed to her pancytopenia  Pancytopenia due to antineoplastic chemotherapy [facility]) She has intermittent pancytopenia due to treatment For now, she will remain on 3 mg of pomalidomide If her pancytopenia is worse, we will consider reducing the dose to 2 mg dose  Lesion of colon She has persistent lesion in her colon The patient has not had screening colonoscopy due to recurrent treatment for multiple myeloma She has been referred to gastroenterologist, appointment pending   No orders of the defined types were placed in this encounter.   INTERVAL HISTORY: Please see below for problem oriented charting. She is seen in the infusion room for further follow-up and treatment She has appointment to go to [facility] next month for discussion about potential clinical trial She feels well No recent infection, fever or chills No new bone pain The patient denies any recent signs or symptoms of bleeding such as spontaneous epistaxis, hematuria or hematochezia. Her recent conjunctiva hemorrhage has resolved  SUMMARY OF ONCOLOGIC HISTORY:   Multiple myeloma in relapse ([place])   07/21/2007 Bone  Marrow Biopsy    Case #: [id number] Bone marrow showed myeloma    07/21/2007 Miscellaneous    She was diagnosed in May 2009. 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 Treatment is stopped due to minimum response and pancytopenia    10/19/2015 -  Chemotherapy    The patient had Velcade for chemotherapy treatment along with weekly Daratumumab. Last dose of Velcade on 02/29/16, stopped due to progression. Pomalidomide added on 03/14/16    04/11/2016 Adverse Reaction    Delay resumption of Pomalyst cycle 2 until 04/16/16 due to neutropenia    09/16/2016 PET scan    1. No findings of active bony lymphoma. Prior activity in the sternum and thoracic spine has resolved. 2. Focal hypermetabolic activity along the sigmoid colon with some local inflammatory stranding in the adjacent mesentery, maximum SUV 11.0. This is probably from mild acute diverticulitis. There is no hypermetabolic activity in this vicinity 4  months ago to suggest that this is from colon cancer. Correlate with any symptoms. 3. Acute right maxillary sinusitis. 4. Thoracolumbar compression fractures, vertebral augmentation at the L1 level.    09/17/2016 Procedure    CT guided bone marrow biopsy of right posterior iliac bone with both aspirate and core samples obtained.     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 - TRILINEAGE HEMATOPOIESIS. - SEE COMMENT. PERIPHERAL BLOOD: - LYMPHOPENIA. - THROMBOCYTOPENIA. Diagnosis Note The bone marrow is slightly hypocellular for age with trilineage hematopoiesis and non-specific myeloid changes likely related to previous therapy. Plasma cells are increased in number representing 8% of all cells in the aspirate, but with lack of large aggregates or sheets in the clot and biopsy sections. Immunohistochemical stains highlight the slightly increased plasma cell component in the marrow which shows kappa light chain restriction consistent with residual/recurrent plasma cell neoplasm. Correlation with cytogenetic and FISH studies recommended    09/17/2016 Pathology Results    Normal bone marrow cytogenetics and FISH    04/06/2018 PET scan    1. No abnormal focus of increased radiotracer uptake identified to suggest active disease or progression of disease. 2. Similar appearance of thoracic and lumbar compression deformities with associated kyphosis deformity. 3. Thickening of the endometrium is again noted. This is indeterminate but is considered abnormal in a postmenopausal female. Consider further evaluation with pelvic sonogram.      REVIEW OF SYSTEMS:   Constitutional: Denies fevers, chills or abnormal weight loss Eyes: Denies blurriness of vision Ears, nose, mouth, throat, and face: Denies mucositis or sore throat Respiratory: Denies cough, dyspnea or wheezes Cardiovascular: Denies palpitation, chest discomfort or lower extremity swelling Gastrointestinal:  Denies nausea, heartburn or change in bowel habits Skin: Denies abnormal skin rashes Lymphatics: Denies new lymphadenopathy or easy bruising Neurological:Denies numbness, tingling or new weaknesses Behavioral/Psych: Mood is stable, no new changes  All  other systems were reviewed with the patient and are negative.  I have reviewed the past medical history, past surgical history, social history and family history with the patient and they are unchanged from previous note.  ALLERGIES:  is allergic to hydromorphone.  MEDICATIONS:  Current Outpatient Medications  Medication Sig Dispense Refill  . albuterol (PROVENTIL HFA;VENTOLIN HFA) 108 (90 Base) MCG/ACT inhaler Inhale 2 puffs into the lungs every 4 (four) hours as needed for wheezing or shortness of breath. 1 Inhaler 2  . aspirin 81 MG tablet Take 81 mg by mouth.    . cholecalciferol (VITAMIN D) 1000 UNITS tablet Take 1,000 Units by mouth daily.    . Multiple Vitamins-Minerals (ONE-A-DAY 50 PLUS PO) Take by mouth daily.    Marland Kitchen oxycodone (OXY-IR) 5 MG capsule Take 5 mg by mouth every 4 (four) hours as needed. pain    . Oxycodone HCl (OXYCONTIN) 60 MG TB12 Take 1 tablet by mouth 2 (two) times daily.    . pantoprazole (PROTONIX) 40 MG tablet TAKE (1) TABLET DAILY AS NEEDED. 30 tablet 0  . POMALYST 3 MG capsule TAKE 1 CAPSULE BY MOUTH ONCE DAILY FOR 21 DAYS ON AND 7 DAYS OFF 21 capsule 0  . valACYclovir (VALTREX) 1000 MG tablet Take 1 tablet (1,000 mg total) by mouth daily. 90 tablet 11   No current facility-administered medications for this visit.     PHYSICAL EXAMINATION: ECOG PERFORMANCE STATUS: 1 - Symptomatic but completely ambulatory GENERAL:alert, no distress and comfortable SKIN:  skin color, texture, turgor are normal, no rashes or significant lesions EYES: normal, Conjunctiva are pink and non-injected, sclera clear OROPHARYNX:no exudate, no erythema and lips, buccal mucosa, and tongue normal  NECK: supple, thyroid normal size, non-tender, without nodularity LYMPH:  no palpable lymphadenopathy in the cervical, axillary or inguinal LUNGS: clear to auscultation and percussion with normal breathing effort HEART: regular rate & rhythm and no murmurs and no lower extremity  edema ABDOMEN:abdomen soft, non-tender and normal bowel sounds Musculoskeletal:no cyanosis of digits and no clubbing  NEURO: alert & oriented x 3 with fluent speech, no focal motor/sensory deficits  LABORATORY DATA:  I have reviewed the data as listed    Component Value Date/Time   NA 141 04/30/2018 0740   NA 140 03/11/2017 1150   K 3.7 04/30/2018 0740   K 3.9 03/11/2017 1150   CL 107 04/30/2018 0740   CL 107 08/30/2012 1055   CO2 26 04/30/2018 0740   CO2 29 03/11/2017 1150   GLUCOSE 88 04/30/2018 0740   GLUCOSE 93 03/11/2017 1150   GLUCOSE 102 (H) 08/30/2012 1055   BUN 10 04/30/2018 0740   BUN 12.5 03/11/2017 1150   CREATININE 0.75 04/30/2018 0740   CREATININE 0.82 02/11/2018 1256   CREATININE 0.8 03/11/2017 1150   CALCIUM 8.0 (L) 04/30/2018 0740   CALCIUM 8.7 03/11/2017 1150   PROT 5.6 (L) 04/30/2018 0740   PROT 6.0 (L) 03/11/2017 1150   ALBUMIN 3.9 04/30/2018 0740   ALBUMIN 3.8 03/11/2017 1150   AST 20 04/30/2018 0740   AST 18 02/11/2018 1256   AST 18 03/11/2017 1150   ALT 12 04/30/2018 0740   ALT 13 02/11/2018 1256   ALT 14 03/11/2017 1150   ALKPHOS 54 04/30/2018 0740   ALKPHOS 45 03/11/2017 1150   BILITOT 0.4 04/30/2018 0740   BILITOT 0.7 02/11/2018 1256   BILITOT 0.37 03/11/2017 1150   GFRNONAA >60 04/30/2018 0740   GFRNONAA >60 02/11/2018 1256   GFRAA >60 04/30/2018 0740   GFRAA >60 02/11/2018 1256    No results found for: SPEP, UPEP  Lab Results  Component Value Date   WBC 4.5 04/30/2018   NEUTROABS 1.9 04/30/2018   HGB 11.9 (L) 04/30/2018   HCT 36.1 04/30/2018   MCV 107.1 (H) 04/30/2018   PLT 120 (L) 04/30/2018      Chemistry      Component Value Date/Time   NA 141 04/30/2018 0740   NA 140 03/11/2017 1150   K 3.7 04/30/2018 0740   K 3.9 03/11/2017 1150   CL 107 04/30/2018 0740   CL 107 08/30/2012 1055   CO2 26 04/30/2018 0740   CO2 29 03/11/2017 1150   BUN 10 04/30/2018 0740   BUN 12.5 03/11/2017 1150   CREATININE 0.75 04/30/2018 0740    CREATININE 0.82 02/11/2018 1256   CREATININE 0.8 03/11/2017 1150      Component Value Date/Time   CALCIUM 8.0 (L) 04/30/2018 0740   CALCIUM 8.7 03/11/2017 1150   ALKPHOS 54 04/30/2018 0740   ALKPHOS 45 03/11/2017 1150   AST 20 04/30/2018 0740   AST 18 02/11/2018 1256   AST 18 03/11/2017 1150   ALT 12 04/30/2018 0740   ALT 13 02/11/2018 1256   ALT 14 03/11/2017 1150   BILITOT 0.4 04/30/2018 0740   BILITOT 0.7 02/11/2018 1256   BILITOT 0.37 03/11/2017 1150       RADIOGRAPHIC STUDIES: I have personally reviewed the radiological images as listed and agreed with the findings in the report.  Nm Pet Image Restage (ps) Whole Body  Result Date: 04/06/2018 CLINICAL DATA:  Subsequent treatment strategy for myeloma. EXAM: NUCLEAR MEDICINE PET WHOLE BODY TECHNIQUE: 6.3 mCi F-18 FDG was injected intravenously. Full-ring PET imaging was performed from the skull base to thigh after the radiotracer. CT data was obtained and used for attenuation correction and anatomic localization. Fasting blood glucose: 84 mg/dl COMPARISON:  09/16/2016 FINDINGS: Mediastinal blood pool activity: SUV max 2.67 HEAD/NECK: No hypermetabolic activity in the scalp. No hypermetabolic cervical lymph nodes. Incidental CT findings: none CHEST: No hypermetabolic mediastinal, hilar, axillary or supraclavicular lymph nodes. No hypermetabolic pulmonary nodules. Scar like densities in the right middle lobe and lingula. Incidental CT findings: none ABDOMEN/PELVIS: No abnormal radiotracer activity identified within the liver, pancreas, adrenal glands, or spleen. No hypermetabolic abdominal or pelvic lymph nodes. Incidental CT findings: Posterior right lobe of liver cyst is unchanged. Mild aortic atherosclerosis. Increased attenuation within the uterine cavity is again noted which measures 1.9 cm in thickness. SKELETON: No focal hypermetabolic activity to suggest skeletal metastasis. Incidental CT findings: Treated compression fracture  involving the L1 vertebra is again noted. Multiple additional compression fractures are noted within the thoracic and lumbar spine with marked thoracic kyphosis. EXTREMITIES: No abnormal hypermetabolic activity in the lower extremities. Incidental CT findings: none IMPRESSION: 1. No abnormal focus of increased radiotracer uptake identified to suggest active disease or progression of disease. 2. Similar appearance of thoracic and lumbar compression deformities with associated kyphosis deformity. 3. Thickening of the endometrium is again noted. This is indeterminate but is considered abnormal in a postmenopausal female. Consider further evaluation with pelvic sonogram. Electronically Signed   By: Kerby Moors M.D.   On: 04/06/2018 15:59    All questions were answered. The patient knows to call the clinic with any problems, questions or concerns. No barriers to learning was detected.  I spent 15 minutes counseling the patient face to face. The total time spent in the appointment was 20 minutes and more than 50% was on counseling and review of test results  [name], MD 04/30/2018 3:22 PM

## 2018-04-30 NOTE — Patient Instructions (Signed)
[facility] Discharge Instructions for Patients Receiving Chemotherapy  Today you received the following chemotherapy agents: Daratumumab (Darzalex)  Hold taking the: pomalidomide (POMALYST) 3 MG capsule Until your next visit with Dr Alvy Bimler.    To help prevent nausea and vomiting after your treatment, we encourage you to take your nausea medication as directed.    If you develop nausea and vomiting that is not controlled by your nausea medication, call the clinic.   BELOW ARE SYMPTOMS THAT SHOULD BE REPORTED IMMEDIATELY:  *FEVER GREATER THAN 100.5 F  *CHILLS WITH OR WITHOUT FEVER  NAUSEA AND VOMITING THAT IS NOT CONTROLLED WITH YOUR NAUSEA MEDICATION  *UNUSUAL SHORTNESS OF BREATH  *UNUSUAL BRUISING OR BLEEDING  TENDERNESS IN MOUTH AND THROAT WITH OR WITHOUT PRESENCE OF ULCERS  *URINARY PROBLEMS  *BOWEL PROBLEMS  UNUSUAL RASH Items with * indicate a potential emergency and should be followed up as soon as possible.  Feel free to call the clinic should you have any questions or concerns. The clinic phone number is [phone].  Please show the [facility] at check-in to the Emergency Department and triage nurse.   Neutropenia Neutropenia is a condition that occurs when you have a lower-than-normal level of a type of white blood cell (neutrophil) in your body. Neutrophils are made in the spongy center of large bones (bone marrow) and they fight infections. Neutrophils are your body's main defense against bacterial and fungal infections. The fewer neutrophils you have and the longer your body remains without them, the greater your risk of getting a severe infection. What are the causes? This condition can occur if your body uses up or destroys neutrophils faster than your bone marrow can make them. This problem may happen because of:  Bacterial or fungal infection.  Allergic disorders.  Reactions to some medicines.  Autoimmune disease.  An  enlarged spleen. This condition can also occur if your bone marrow does not produce enough neutrophils. This problem may be caused by:  Cancer.  Cancer treatments, such as radiation or chemotherapy.  Viral infections.  Medicines, such as phenytoin.  Vitamin B12 deficiency.  Diseases of the bone marrow.  Environmental toxins, such as insecticides. What are the signs or symptoms? This condition does not usually cause symptoms. If symptoms are present, they are usually caused by an underlying infection. Symptoms of an infection may include:  Fever.  Chills.  Swollen glands.  Oral or anal ulcers.  Cough and shortness of breath.  Rash.  Skin infection.  Fatigue. How is this diagnosed? Your health care provider may suspect neutropenia if you have:  A condition that may cause neutropenia.  Symptoms of infection, especially fever.  Frequent and unusual infections. You will have a medical history and physical exam. Tests will also be done, such as:  A complete blood count (CBC).  A procedure to collect a sample of bone marrow for examination (bone marrow biopsy).  A chest X-ray.  A urine culture.  A blood culture. How is this treated? Treatment depends on the underlying cause and severity of your condition. Mild neutropenia may not require treatment. Treatment may include medicines, such as:  Antibiotic medicine given through an IV tube.  Antiviral medicines.  Antifungal medicines.  A medicine to increase neutrophil production (colony-stimulating factor). You may get this drug through an IV tube or by injection.  Steroids given through an IV tube. If an underlying condition is causing neutropenia, you may need treatment for that condition. If medicines you are  taking are causing neutropenia, your health care provider may have you stop taking those medicines. Follow these instructions at home: Medicines   Take over-the-counter and prescription medicines  only as told by your health care provider.  Get a seasonal flu shot (influenza vaccine). Lifestyle  Do not eat unpasteurized foods.Do not eat unwashed raw fruits or vegetables.  Avoid exposure to groups of people or children.  Avoid being around people who are sick.  Avoid being around dirt or dust, such as in construction areas or gardens.  Do not provide direct care for pets. Avoid animal droppings. Do not clean litter boxes and bird cages. Hygiene   Bathe daily.  Clean the area between the genitals and the anus (perineal area) after you urinate or have a bowel movement. If you are female, wipe from front to back.  Brush your teeth with a soft toothbrush before and after meals.  Do not use a razor that has a blade. Use an electric razor to remove hair.  Wash your hands often. Make sure others who come in contact with you also wash their hands. If soap and water are not available, use hand sanitizer. General instructions  Do not have sex unless your health care provider has approved.  Take actions to avoid cuts and burns. For example: ? Be cautious when you use knives. Always cut away from yourself. ? Keep knives in protective sheaths or guards when not in use. ? Use oven mitts when you cook with a hot stove, oven, or grill. ? Stand a safe distance away from open fires.  Avoid people who received a vaccine in the past 30 days if that vaccine contained a live version of the germ (live vaccine). You should not get a live vaccine. Common live vaccines are varicella, measles, mumps, and rubella.  Do not share food utensils.  Do not use tampons, enemas, or rectal suppositories unless your health care provider has approved.  Keep all appointments as told by your health care provider. This is important. Contact a health care provider if:  You have a fever.  You have chills or you start to shake.  You have: ? A sore throat. ? A warm, red, or tender area on your skin. ? A  cough. ? Frequent or painful urination. ? Vaginal discharge or itching.  You develop: ? Sores in your mouth or anus. ? Swollen lymph nodes. ? Red streaks on the skin. ? A rash.  You feel: ? Nauseous or you vomit. ? Very fatigued. ? Short of breath. This information is not intended to replace advice given to you by your health care provider. Make sure you discuss any questions you have with your health care provider. Document Released: 08/16/2001 Document Revised: 10/21/2017 Document Reviewed: 09/06/2014 Elsevier Interactive Patient Education  2019 Elsevier Inc.   Zoledronic Acid injection (Hypercalcemia, Oncology) (Zometa) What is this medicine? ZOLEDRONIC ACID (ZOE le dron ik AS id) lowers the amount of calcium loss from bone. It is used to treat too much calcium in your blood from cancer. It is also used to prevent complications of cancer that has spread to the bone. This medicine may be used for other purposes; ask your health care provider or pharmacist if you have questions. COMMON BRAND NAME(S): Zometa What should I tell my health care provider before I take this medicine? They need to know if you have any of these conditions: -aspirin-sensitive asthma -cancer, especially if you are receiving medicines used to treat cancer -dental disease  or wear dentures -infection -kidney disease -receiving corticosteroids like dexamethasone or prednisone -an unusual or allergic reaction to zoledronic acid, other medicines, foods, dyes, or preservatives -pregnant or trying to get pregnant -breast-feeding How should I use this medicine? This medicine is for infusion into a vein. It is given by a health care professional in a hospital or clinic setting. Talk to your pediatrician regarding the use of this medicine in children. Special care may be needed. Overdosage: If you think you have taken too much of this medicine contact a poison control center or emergency room at once. NOTE: This  medicine is only for you. Do not share this medicine with others. What if I miss a dose? It is important not to miss your dose. Call your doctor or health care professional if you are unable to keep an appointment. What may interact with this medicine? -certain antibiotics given by injection -NSAIDs, medicines for pain and inflammation, like ibuprofen or naproxen -some diuretics like bumetanide, furosemide -teriparatide -thalidomide This list may not describe all possible interactions. Give your health care provider a list of all the medicines, herbs, non-prescription drugs, or dietary supplements you use. Also tell them if you smoke, drink alcohol, or use illegal drugs. Some items may interact with your medicine. What should I watch for while using this medicine? Visit your doctor or health care professional for regular checkups. It may be some time before you see the benefit from this medicine. Do not stop taking your medicine unless your doctor tells you to. Your doctor may order blood tests or other tests to see how you are doing. Women should inform their doctor if they wish to become pregnant or think they might be pregnant. There is a potential for serious side effects to an unborn child. Talk to your health care professional or pharmacist for more information. You should make sure that you get enough calcium and vitamin D while you are taking this medicine. Discuss the foods you eat and the vitamins you take with your health care professional. Some people who take this medicine have severe bone, joint, and/or muscle pain. This medicine may also increase your risk for jaw problems or a broken thigh bone. Tell your doctor right away if you have severe pain in your jaw, bones, joints, or muscles. Tell your doctor if you have any pain that does not go away or that gets worse. Tell your dentist and dental surgeon that you are taking this medicine. You should not have major dental surgery while on  this medicine. See your dentist to have a dental exam and fix any dental problems before starting this medicine. Take good care of your teeth while on this medicine. Make sure you see your dentist for regular follow-up appointments. What side effects may I notice from receiving this medicine? Side effects that you should report to your doctor or health care professional as soon as possible: -allergic reactions like skin rash, itching or hives, swelling of the face, lips, or tongue -anxiety, confusion, or depression -breathing problems -changes in vision -eye pain -feeling faint or lightheaded, falls -jaw pain, especially after dental work -mouth sores -muscle cramps, stiffness, or weakness -redness, blistering, peeling or loosening of the skin, including inside the mouth -trouble passing urine or change in the amount of urine Side effects that usually do not require medical attention (report to your doctor or health care professional if they continue or are bothersome): -bone, joint, or muscle pain -constipation -diarrhea -fever -hair loss -irritation  at site where injected -loss of appetite -nausea, vomiting -stomach upset -trouble sleeping -trouble swallowing -weak or tired This list may not describe all possible side effects. Call your doctor for medical advice about side effects. You may report side effects to FDA at 1-800-FDA-1088. Where should I keep my medicine? This drug is given in a hospital or clinic and will not be stored at home. NOTE: This sheet is a summary. It may not cover all possible information. If you have questions about this medicine, talk to your doctor, pharmacist, or health care provider.  2019 Elsevier/Gold Standard (2013-07-23 14:19:39)

## 2018-04-30 NOTE — Assessment & Plan Note (Signed)
Repeat PET scan showed no evidence of new bone lesion Her serum light chains and urine study confirmed slight disease progression She has persistent pancytopenia due to treatment She has appointment to see her hematologist at [facility] to discuss potential clinical trial We discussed the plan for now will be to proceed with monthly daratumumab along with pomalidomide, with dose adjustment as needed to her pancytopenia

## 2018-05-03 LAB — KAPPA/LAMBDA LIGHT CHAINS
Kappa free light chain: 106.6 mg/L — ABNORMAL HIGH (ref 3.3–19.4)
Kappa, lambda light chain ratio: 46.35 — ABNORMAL HIGH (ref 0.26–1.65)
Lambda free light chains: 2.3 mg/L — ABNORMAL LOW (ref 5.7–26.3)

## 2018-05-04 LAB — MULTIPLE MYELOMA PANEL, SERUM
ALBUMIN/GLOB SERPL: 2.4 — AB (ref 0.7–1.7)
Albumin SerPl Elph-Mcnc: 3.7 g/dL (ref 2.9–4.4)
Alpha 1: 0.2 g/dL (ref 0.0–0.4)
Alpha2 Glob SerPl Elph-Mcnc: 0.5 g/dL (ref 0.4–1.0)
B-Globulin SerPl Elph-Mcnc: 0.7 g/dL (ref 0.7–1.3)
GLOBULIN, TOTAL: 1.6 g/dL — AB (ref 2.2–3.9)
Gamma Glob SerPl Elph-Mcnc: 0.2 g/dL — ABNORMAL LOW (ref 0.4–1.8)
IgA: 9 mg/dL — ABNORMAL LOW (ref 87–352)
IgG (Immunoglobin G), Serum: 276 mg/dL — ABNORMAL LOW (ref 700–1600)
IgM (Immunoglobulin M), Srm: 10 mg/dL — ABNORMAL LOW (ref 26–217)
M Protein SerPl Elph-Mcnc: 0.1 g/dL — ABNORMAL HIGH
Total Protein ELP: 5.3 g/dL — ABNORMAL LOW (ref 6.0–8.5)

## 2018-05-05 ENCOUNTER — Other Ambulatory Visit: Payer: Self-pay | Admitting: Hematology and Oncology

## 2018-05-05 ENCOUNTER — Telehealth: Payer: Self-pay

## 2018-05-05 NOTE — Telephone Encounter (Signed)
Called and given below message. She verbalized understanding. 

## 2018-05-05 NOTE — Telephone Encounter (Signed)
-----   Message from [name], MD sent at 05/05/2018  7:50 AM EST ----- Regarding: myeloma panel Pls let her know light chains are stable ----- Message ----- From: Interface, Lab In [place] Sent: 04/30/2018   8:03 AM EST To: [name], MD

## 2018-05-07 ENCOUNTER — Other Ambulatory Visit: Payer: Self-pay

## 2018-05-07 MED ORDER — POMALIDOMIDE 3 MG PO CAPS: ORAL_CAPSULE | ORAL | 0 refills | Status: DC

## 2018-05-20 ENCOUNTER — Telehealth: Payer: Self-pay

## 2018-05-20 NOTE — Telephone Encounter (Signed)
Pt needs to be scheduled for colonoscopy through Dr Gardiner Barefoot office.  Contact his medical assistant at [phone]

## 2018-05-28 ENCOUNTER — Inpatient Hospital Stay (HOSPITAL_BASED_OUTPATIENT_CLINIC_OR_DEPARTMENT_OTHER): Payer: 59 | Admitting: Hematology and Oncology

## 2018-05-28 ENCOUNTER — Other Ambulatory Visit: Payer: Self-pay

## 2018-05-28 ENCOUNTER — Inpatient Hospital Stay: Payer: 59

## 2018-05-28 ENCOUNTER — Other Ambulatory Visit: Payer: 59

## 2018-05-28 ENCOUNTER — Encounter: Payer: Self-pay | Admitting: Hematology and Oncology

## 2018-05-28 ENCOUNTER — Ambulatory Visit: Payer: 59

## 2018-05-28 ENCOUNTER — Inpatient Hospital Stay: Payer: 59 | Attending: Hematology and Oncology

## 2018-05-28 VITALS — BP 120/74 | HR 83 | Temp 98.1°F | Resp 17

## 2018-05-28 DIAGNOSIS — C9002 Multiple myeloma in relapse: Secondary | ICD-10-CM

## 2018-05-28 DIAGNOSIS — D61810 Antineoplastic chemotherapy induced pancytopenia: Secondary | ICD-10-CM | POA: Insufficient documentation

## 2018-05-28 DIAGNOSIS — K639 Disease of intestine, unspecified: Secondary | ICD-10-CM

## 2018-05-28 DIAGNOSIS — Z5112 Encounter for antineoplastic immunotherapy: Secondary | ICD-10-CM | POA: Insufficient documentation

## 2018-05-28 DIAGNOSIS — T451X5A Adverse effect of antineoplastic and immunosuppressive drugs, initial encounter: Secondary | ICD-10-CM

## 2018-05-28 DIAGNOSIS — Z7982 Long term (current) use of aspirin: Secondary | ICD-10-CM | POA: Insufficient documentation

## 2018-05-28 DIAGNOSIS — Z79899 Other long term (current) drug therapy: Secondary | ICD-10-CM | POA: Insufficient documentation

## 2018-05-28 LAB — CMP (CANCER CENTER ONLY)
ALT: 14 U/L (ref 0–44)
AST: 19 U/L (ref 15–41)
Albumin: 3.8 g/dL (ref 3.5–5.0)
Alkaline Phosphatase: 51 U/L (ref 38–126)
Anion gap: 12 (ref 5–15)
BUN: 16 mg/dL (ref 6–20)
CO2: 25 mmol/L (ref 22–32)
Calcium: 8.6 mg/dL — ABNORMAL LOW (ref 8.9–10.3)
Chloride: 103 mmol/L (ref 98–111)
Creatinine: 0.73 mg/dL (ref 0.44–1.00)
GFR, Est AFR Am: >60 mL/min (ref >60)
GFR, Estimated: >60 mL/min (ref >60)
Glucose, Bld: 86 mg/dL (ref 70–99)
POTASSIUM: 3.8 mmol/L (ref 3.5–5.1)
SODIUM: 140 mmol/L (ref 135–145)
TOTAL PROTEIN: 6 g/dL — AB (ref 6.5–8.1)
Total Bilirubin: 0.5 mg/dL (ref 0.3–1.2)

## 2018-05-28 LAB — CBC WITH DIFFERENTIAL/PLATELET
Abs Immature Granulocytes: 0.01 10*3/uL (ref 0.00–0.07)
Basophils Absolute: 0.1 10*3/uL (ref 0.0–0.1)
Basophils Relative: 3 %
Eosinophils Absolute: 0.4 10*3/uL (ref 0.0–0.5)
Eosinophils Relative: 10 %
HCT: 34.9 % — ABNORMAL LOW (ref 36.0–46.0)
Hemoglobin: 11.8 g/dL — ABNORMAL LOW (ref 12.0–15.0)
Immature Granulocytes: 0 %
LYMPHS ABS: 0.9 10*3/uL (ref 0.7–4.0)
Lymphocytes Relative: 26 %
MCH: 35 pg — ABNORMAL HIGH (ref 26.0–34.0)
MCHC: 33.8 g/dL (ref 30.0–36.0)
MCV: 103.6 fL — ABNORMAL HIGH (ref 80.0–100.0)
MONO ABS: 0.6 10*3/uL (ref 0.1–1.0)
Monocytes Relative: 16 %
Neutro Abs: 1.7 10*3/uL (ref 1.7–7.7)
Neutrophils Relative %: 45 %
Platelets: 150 10*3/uL (ref 150–400)
RBC: 3.37 MIL/uL — ABNORMAL LOW (ref 3.87–5.11)
RDW: 12.9 % (ref 11.5–15.5)
WBC: 3.7 10*3/uL — ABNORMAL LOW (ref 4.0–10.5)
nRBC: 0 % (ref 0.0–0.2)

## 2018-05-28 MED ORDER — SODIUM CHLORIDE 0.9 % IV SOLN
15.2000 mg/kg | Freq: Once | INTRAVENOUS | Status: AC
  Administered 2018-05-28: 900 mg via INTRAVENOUS
  Filled 2018-05-28: qty 40

## 2018-05-28 MED ORDER — DIPHENHYDRAMINE HCL 25 MG PO CAPS
50.0000 mg | ORAL_CAPSULE | Freq: Once | ORAL | Status: AC
  Administered 2018-05-28: 50 mg via ORAL

## 2018-05-28 MED ORDER — SODIUM CHLORIDE 0.9% FLUSH
10.0000 mL | INTRAVENOUS | Status: DC | PRN
  Administered 2018-05-28: 10 mL
  Filled 2018-05-28: qty 10

## 2018-05-28 MED ORDER — DIPHENHYDRAMINE HCL 25 MG PO CAPS
ORAL_CAPSULE | ORAL | Status: AC
  Filled 2018-05-28: qty 2

## 2018-05-28 MED ORDER — HEPARIN SOD (PORK) LOCK FLUSH 100 UNIT/ML IV SOLN
500.0000 [IU] | Freq: Once | INTRAVENOUS | Status: AC | PRN
  Administered 2018-05-28: 500 [IU]
  Filled 2018-05-28: qty 5

## 2018-05-28 MED ORDER — ACETAMINOPHEN 325 MG PO TABS
ORAL_TABLET | ORAL | Status: AC
  Filled 2018-05-28: qty 2

## 2018-05-28 MED ORDER — SODIUM CHLORIDE 0.9 % IV SOLN
20.0000 mg | Freq: Once | INTRAVENOUS | Status: AC
  Administered 2018-05-28: 20 mg via INTRAVENOUS
  Filled 2018-05-28: qty 20

## 2018-05-28 MED ORDER — SODIUM CHLORIDE 0.9 % IV SOLN
Freq: Once | INTRAVENOUS | Status: AC
  Administered 2018-05-28: 09:00:00 via INTRAVENOUS
  Filled 2018-05-28: qty 250

## 2018-05-28 MED ORDER — PROCHLORPERAZINE MALEATE 10 MG PO TABS
ORAL_TABLET | ORAL | Status: AC
  Filled 2018-05-28: qty 1

## 2018-05-28 MED ORDER — ACETAMINOPHEN 325 MG PO TABS
ORAL_TABLET | ORAL | Status: AC
  Filled 2018-05-28: qty 1

## 2018-05-28 MED ORDER — PROCHLORPERAZINE MALEATE 10 MG PO TABS
10.0000 mg | ORAL_TABLET | Freq: Once | ORAL | Status: AC
  Administered 2018-05-28: 10 mg via ORAL

## 2018-05-28 MED ORDER — ACETAMINOPHEN 325 MG PO TABS
650.0000 mg | ORAL_TABLET | Freq: Once | ORAL | Status: AC
  Administered 2018-05-28: 650 mg via ORAL

## 2018-05-28 NOTE — Progress Notes (Signed)
[facility] OFFICE PROGRESS NOTE  Patient Care Team: [name], MD as PCP - General [name], MD as Consulting Physician (Oncology) [name], MD as Consulting Physician (Neurosurgery) [name], MD as Consulting Physician (Hematology and Oncology) [name], [name], MD as Referring Physician (Hematology and Oncology)  ASSESSMENT & PLAN:  Multiple myeloma in relapse [facility]) Repeat PET scan showed no evidence of new bone lesion Her serum light chains and urine study confirmed slight disease progression but she is not symptomatic I have reviewed recommendation from [facility] Currently, she does not qualify for clinical trial but she may be in the future She would like to continue treatment for now We discussed the plan for now will be to proceed with monthly daratumumab along with pomalidomide, with dose adjustment as needed to her pancytopenia  Lesion of colon I spoke with her gastroenterologist recently Currently, she is not symptomatic She is not enthusiastic to her pursue colonoscopy especially in view of recent viral outbreak We discussed the risk and benefits of not pursuing colonoscopy  Pancytopenia due to antineoplastic chemotherapy ([place]) She has intermittent pancytopenia due to treatment For now, she will remain on 3 mg of pomalidomide If her pancytopenia is worse, we will consider reducing the dose to 2 mg dose   No orders of the defined types were placed in this encounter.   INTERVAL HISTORY: Please see below for problem oriented charting. She returns for further follow-up. She denies recent infection, fever or chills She has stable chronic bone pain. No recent bowel habits changes  SUMMARY OF ONCOLOGIC HISTORY:   Multiple myeloma in relapse ([place])   07/21/2007 Bone Marrow Biopsy    Case #: [id number] Bone marrow showed myeloma    07/21/2007 Miscellaneous    She was diagnosed in May 2009. Had several  cycles of RVD    10/14/2007 Bone Marrow Biopsy    Case #: [id number] repeat bone marrow biopsy showed good response to Rx    12/31/2007 Bone Marrow Transplant    She had autologous BMT at [facility]    07/19/2009 Bone Marrow Biopsy    Case #: [id number] Bone marrow biopsy showed only 1% plasma cells    10/10/2010 Bone Marrow Biopsy    [id number] Bone marrow biopsy showed 2% plasma cells    09/25/2011 Bone Marrow Biopsy    Accession #: [id number] Bone marrow only showed 4% plasma cells with ligh chain excess    10/14/2011 - 06/13/2013 Chemotherapy    She received Revlimid only, discontinued due to pancytopenia    06/18/2015 - 09/13/2015 Chemotherapy    She resumed taking Revlimid and dexamethasone. Treatment is stopped due to minimum response and pancytopenia    10/19/2015 -  Chemotherapy    The patient had Velcade for chemotherapy treatment along with weekly Daratumumab. Last dose of Velcade on 02/29/16, stopped due to progression. Pomalidomide added on 03/14/16    04/11/2016 Adverse Reaction    Delay resumption of Pomalyst cycle 2 until 04/16/16 due to neutropenia    09/16/2016 PET scan    1. No findings of active bony lymphoma. Prior activity in the sternum and thoracic spine has resolved. 2. Focal hypermetabolic activity along the sigmoid colon with some local inflammatory stranding in the adjacent mesentery, maximum SUV 11.0. This is probably from mild acute diverticulitis. There is no hypermetabolic activity in this vicinity 4 months ago to suggest that this is from colon cancer. Correlate with any symptoms. 3. Acute right maxillary  sinusitis. 4. Thoracolumbar compression fractures, vertebral augmentation at the L1 level.    09/17/2016 Procedure    CT guided bone marrow biopsy of right posterior iliac bone with both aspirate and core samples obtained.    09/17/2016 Bone Marrow Biopsy    Bone Marrow, Aspirate,Biopsy, and Clot, right iliac - SLIGHTLY HYPOCELLULAR BONE MARROW FOR AGE WITH  PLASMA CELL NEOPLASM. - TRILINEAGE HEMATOPOIESIS. - SEE COMMENT. PERIPHERAL BLOOD: - LYMPHOPENIA. - THROMBOCYTOPENIA. Diagnosis Note The bone marrow is slightly hypocellular for age with trilineage hematopoiesis and non-specific myeloid changes likely related to previous therapy. Plasma cells are increased in number representing 8% of all cells in the aspirate, but with lack of large aggregates or sheets in the clot and biopsy sections. Immunohistochemical stains highlight the slightly increased plasma cell component in the marrow which shows kappa light chain restriction consistent with residual/recurrent plasma cell neoplasm. Correlation with cytogenetic and FISH studies recommended    09/17/2016 Pathology Results    Normal bone marrow cytogenetics and FISH    04/06/2018 PET scan    1. No abnormal focus of increased radiotracer uptake identified to suggest active disease or progression of disease. 2. Similar appearance of thoracic and lumbar compression deformities with associated kyphosis deformity. 3. Thickening of the endometrium is again noted. This is indeterminate but is considered abnormal in a postmenopausal female. Consider further evaluation with pelvic sonogram.      REVIEW OF SYSTEMS:   Constitutional: Denies fevers, chills or abnormal weight loss Eyes: Denies blurriness of vision Ears, nose, mouth, throat, and face: Denies mucositis or sore throat Respiratory: Denies cough, dyspnea or wheezes Cardiovascular: Denies palpitation, chest discomfort or lower extremity swelling Gastrointestinal:  Denies nausea, heartburn or change in bowel habits Skin: Denies abnormal skin rashes Lymphatics: Denies new lymphadenopathy or easy bruising Neurological:Denies numbness, tingling or new weaknesses Behavioral/Psych: Mood is stable, no new changes  All other systems were reviewed with the patient and are negative.  I have reviewed the past medical history, past surgical history,  social history and family history with the patient and they are unchanged from previous note.  ALLERGIES:  is allergic to hydromorphone.  MEDICATIONS:  Current Outpatient Medications  Medication Sig Dispense Refill  . albuterol (PROVENTIL HFA;VENTOLIN HFA) 108 (90 Base) MCG/ACT inhaler Inhale 2 puffs into the lungs every 4 (four) hours as needed for wheezing or shortness of breath. 1 Inhaler 2  . aspirin 81 MG tablet Take 81 mg by mouth.    . cholecalciferol (VITAMIN D) 1000 UNITS tablet Take 1,000 Units by mouth daily.    . Multiple Vitamins-Minerals (ONE-A-DAY 50 PLUS PO) Take by mouth daily.    Marland Kitchen oxycodone (OXY-IR) 5 MG capsule Take 5 mg by mouth every 4 (four) hours as needed. pain    . Oxycodone HCl (OXYCONTIN) 60 MG TB12 Take 1 tablet by mouth 2 (two) times daily.    . pantoprazole (PROTONIX) 40 MG tablet TAKE (1) TABLET DAILY AS NEEDED. 30 tablet 0  . pomalidomide (POMALYST) 3 MG capsule TAKE 1 CAPSULE BY MOUTH ONCE DAILY FOR 21 DAYS ON AND 7 DAYS OFF 21 capsule 0  . valACYclovir (VALTREX) 1000 MG tablet Take 1 tablet (1,000 mg total) by mouth daily. 90 tablet 11   No current facility-administered medications for this visit.     PHYSICAL EXAMINATION: ECOG PERFORMANCE STATUS: 1 - Symptomatic but completely ambulatory GENERAL:alert, no distress and comfortable SKIN: skin color, texture, turgor are normal, no rashes or significant lesions EYES: normal, Conjunctiva are pink and  non-injected, sclera clear OROPHARYNX:no exudate, no erythema and lips, buccal mucosa, and tongue normal  NECK: supple, thyroid normal size, non-tender, without nodularity LYMPH:  no palpable lymphadenopathy in the cervical, axillary or inguinal LUNGS: clear to auscultation and percussion with normal breathing effort HEART: regular rate & rhythm and no murmurs and no lower extremity edema ABDOMEN:abdomen soft, non-tender and normal bowel sounds Musculoskeletal:no cyanosis of digits and no clubbing  NEURO:  alert & oriented x 3 with fluent speech, no focal motor/sensory deficits  LABORATORY DATA:  I have reviewed the data as listed    Component Value Date/Time   NA 140 05/28/2018 0804   NA 140 03/11/2017 1150   K 3.8 05/28/2018 0804   K 3.9 03/11/2017 1150   CL 103 05/28/2018 0804   CL 107 08/30/2012 1055   CO2 25 05/28/2018 0804   CO2 29 03/11/2017 1150   GLUCOSE 86 05/28/2018 0804   GLUCOSE 93 03/11/2017 1150   GLUCOSE 102 (H) 08/30/2012 1055   BUN 16 05/28/2018 0804   BUN 12.5 03/11/2017 1150   CREATININE 0.73 05/28/2018 0804   CREATININE 0.8 03/11/2017 1150   CALCIUM 8.6 (L) 05/28/2018 0804   CALCIUM 8.7 03/11/2017 1150   PROT 6.0 (L) 05/28/2018 0804   PROT 6.0 (L) 03/11/2017 1150   ALBUMIN 3.8 05/28/2018 0804   ALBUMIN 3.8 03/11/2017 1150   AST 19 05/28/2018 0804   AST 18 03/11/2017 1150   ALT 14 05/28/2018 0804   ALT 14 03/11/2017 1150   ALKPHOS 51 05/28/2018 0804   ALKPHOS 45 03/11/2017 1150   BILITOT 0.5 05/28/2018 0804   BILITOT 0.37 03/11/2017 1150   GFRNONAA >60 05/28/2018 0804   GFRAA >60 05/28/2018 0804    No results found for: SPEP, UPEP  Lab Results  Component Value Date   WBC 3.7 (L) 05/28/2018   NEUTROABS 1.7 05/28/2018   HGB 11.8 (L) 05/28/2018   HCT 34.9 (L) 05/28/2018   MCV 103.6 (H) 05/28/2018   PLT 150 05/28/2018      Chemistry      Component Value Date/Time   NA 140 05/28/2018 0804   NA 140 03/11/2017 1150   K 3.8 05/28/2018 0804   K 3.9 03/11/2017 1150   CL 103 05/28/2018 0804   CL 107 08/30/2012 1055   CO2 25 05/28/2018 0804   CO2 29 03/11/2017 1150   BUN 16 05/28/2018 0804   BUN 12.5 03/11/2017 1150   CREATININE 0.73 05/28/2018 0804   CREATININE 0.8 03/11/2017 1150      Component Value Date/Time   CALCIUM 8.6 (L) 05/28/2018 0804   CALCIUM 8.7 03/11/2017 1150   ALKPHOS 51 05/28/2018 0804   ALKPHOS 45 03/11/2017 1150   AST 19 05/28/2018 0804   AST 18 03/11/2017 1150   ALT 14 05/28/2018 0804   ALT 14 03/11/2017 1150    BILITOT 0.5 05/28/2018 0804   BILITOT 0.37 03/11/2017 1150      All questions were answered. The patient knows to call the clinic with any problems, questions or concerns. No barriers to learning was detected.  I spent 15 minutes counseling the patient face to face. The total time spent in the appointment was 20 minutes and more than 50% was on counseling and review of test results  [name], MD 05/28/2018 5:08 PM

## 2018-05-28 NOTE — Assessment & Plan Note (Signed)
Repeat PET scan showed no evidence of new bone lesion Her serum light chains and urine study confirmed slight disease progression but she is not symptomatic I have reviewed recommendation from [facility] Currently, she does not qualify for clinical trial but she may be in the future She would like to continue treatment for now We discussed the plan for now will be to proceed with monthly daratumumab along with pomalidomide, with dose adjustment as needed to her pancytopenia

## 2018-05-28 NOTE — Patient Instructions (Addendum)
[facility] Discharge Instructions for Patients Receiving Chemotherapy  Today you received the following chemotherapy agents: Darzalex  To help prevent nausea and vomiting after your treatment, we encourage you to take your nausea medication as directed.    If you develop nausea and vomiting that is not controlled by your nausea medication, call the clinic.   BELOW ARE SYMPTOMS THAT SHOULD BE REPORTED IMMEDIATELY:  *FEVER GREATER THAN 100.5 F  *CHILLS WITH OR WITHOUT FEVER  NAUSEA AND VOMITING THAT IS NOT CONTROLLED WITH YOUR NAUSEA MEDICATION  *UNUSUAL SHORTNESS OF BREATH  *UNUSUAL BRUISING OR BLEEDING  TENDERNESS IN MOUTH AND THROAT WITH OR WITHOUT PRESENCE OF ULCERS  *URINARY PROBLEMS  *BOWEL PROBLEMS  UNUSUAL RASH Items with * indicate a potential emergency and should be followed up as soon as possible.  Feel free to call the clinic should you have any questions or concerns. The clinic phone number is [phone].  Please show the CHEMO ALERT CARD at check-in to the Emergency Department and triage nurse.   

## 2018-05-28 NOTE — Assessment & Plan Note (Signed)
She has intermittent pancytopenia due to treatment For now, she will remain on 3 mg of pomalidomide If her pancytopenia is worse, we will consider reducing the dose to 2 mg dose 

## 2018-05-28 NOTE — Assessment & Plan Note (Signed)
I spoke with her gastroenterologist recently Currently, she is not symptomatic She is not enthusiastic to her pursue colonoscopy especially in view of recent viral outbreak We discussed the risk and benefits of not pursuing colonoscopy

## 2018-05-31 LAB — MULTIPLE MYELOMA PANEL, SERUM
ALBUMIN SERPL ELPH-MCNC: 3.7 g/dL (ref 2.9–4.4)
Albumin/Glob SerPl: 2 — ABNORMAL HIGH (ref 0.7–1.7)
Alpha 1: 0.3 g/dL (ref 0.0–0.4)
Alpha2 Glob SerPl Elph-Mcnc: 0.7 g/dL (ref 0.4–1.0)
B-Globulin SerPl Elph-Mcnc: 0.6 g/dL — ABNORMAL LOW (ref 0.7–1.3)
Gamma Glob SerPl Elph-Mcnc: 0.2 g/dL — ABNORMAL LOW (ref 0.4–1.8)
Globulin, Total: 1.9 g/dL — ABNORMAL LOW (ref 2.2–3.9)
IgA: 10 mg/dL — ABNORMAL LOW (ref 87–352)
IgG (Immunoglobin G), Serum: 308 mg/dL — ABNORMAL LOW (ref 700–1600)
IgM (Immunoglobulin M), Srm: 10 mg/dL — ABNORMAL LOW (ref 26–217)
M PROTEIN SERPL ELPH-MCNC: 0.1 g/dL — AB
Total Protein ELP: 5.6 g/dL — ABNORMAL LOW (ref 6.0–8.5)

## 2018-05-31 LAB — KAPPA/LAMBDA LIGHT CHAINS
Kappa free light chain: 113 mg/L — ABNORMAL HIGH (ref 3.3–19.4)
Kappa, lambda light chain ratio: 37.67 — ABNORMAL HIGH (ref 0.26–1.65)
Lambda free light chains: 3 mg/L — ABNORMAL LOW (ref 5.7–26.3)

## 2018-06-07 ENCOUNTER — Other Ambulatory Visit: Payer: Self-pay | Admitting: Hematology and Oncology

## 2018-06-08 ENCOUNTER — Telehealth: Payer: Self-pay

## 2018-06-08 NOTE — Telephone Encounter (Signed)
Prescription clarification request received from [facility] in need of [employer] auth number for Pomalyst prescription on 3/30. Auth # [id number] Faxed info to [facility]

## 2018-06-12 ENCOUNTER — Encounter: Payer: Self-pay | Admitting: Hematology and Oncology

## 2018-06-24 ENCOUNTER — Other Ambulatory Visit: Payer: Self-pay | Admitting: Hematology and Oncology

## 2018-06-25 ENCOUNTER — Other Ambulatory Visit: Payer: Self-pay

## 2018-06-25 ENCOUNTER — Inpatient Hospital Stay (HOSPITAL_BASED_OUTPATIENT_CLINIC_OR_DEPARTMENT_OTHER): Payer: 59 | Admitting: Hematology and Oncology

## 2018-06-25 ENCOUNTER — Inpatient Hospital Stay: Payer: 59 | Attending: Hematology and Oncology

## 2018-06-25 ENCOUNTER — Inpatient Hospital Stay: Payer: 59

## 2018-06-25 ENCOUNTER — Encounter: Payer: Self-pay | Admitting: Hematology and Oncology

## 2018-06-25 VITALS — BP 126/76 | HR 71 | Temp 97.8°F | Resp 16

## 2018-06-25 DIAGNOSIS — T451X5A Adverse effect of antineoplastic and immunosuppressive drugs, initial encounter: Secondary | ICD-10-CM | POA: Insufficient documentation

## 2018-06-25 DIAGNOSIS — D61810 Antineoplastic chemotherapy induced pancytopenia: Secondary | ICD-10-CM | POA: Diagnosis not present

## 2018-06-25 DIAGNOSIS — C9002 Multiple myeloma in relapse: Secondary | ICD-10-CM

## 2018-06-25 DIAGNOSIS — Z79899 Other long term (current) drug therapy: Secondary | ICD-10-CM | POA: Diagnosis not present

## 2018-06-25 DIAGNOSIS — Z5112 Encounter for antineoplastic immunotherapy: Secondary | ICD-10-CM | POA: Insufficient documentation

## 2018-06-25 LAB — CBC WITH DIFFERENTIAL/PLATELET
Abs Immature Granulocytes: 0.01 10*3/uL (ref 0.00–0.07)
Basophils Absolute: 0.1 10*3/uL (ref 0.0–0.1)
Basophils Relative: 2 %
Eosinophils Absolute: 0.6 10*3/uL — ABNORMAL HIGH (ref 0.0–0.5)
Eosinophils Relative: 13 %
HCT: 34.8 % — ABNORMAL LOW (ref 36.0–46.0)
Hemoglobin: 11.4 g/dL — ABNORMAL LOW (ref 12.0–15.0)
Immature Granulocytes: 0 %
Lymphocytes Relative: 21 %
Lymphs Abs: 0.9 10*3/uL (ref 0.7–4.0)
MCH: 35 pg — ABNORMAL HIGH (ref 26.0–34.0)
MCHC: 32.8 g/dL (ref 30.0–36.0)
MCV: 106.7 fL — ABNORMAL HIGH (ref 80.0–100.0)
Monocytes Absolute: 0.7 10*3/uL (ref 0.1–1.0)
Monocytes Relative: 15 %
Neutro Abs: 2.2 10*3/uL (ref 1.7–7.7)
Neutrophils Relative %: 49 %
Platelets: 113 10*3/uL — ABNORMAL LOW (ref 150–400)
RBC: 3.26 MIL/uL — ABNORMAL LOW (ref 3.87–5.11)
RDW: 13.7 % (ref 11.5–15.5)
WBC: 4.4 10*3/uL (ref 4.0–10.5)
nRBC: 0 % (ref 0.0–0.2)

## 2018-06-25 LAB — CMP (CANCER CENTER ONLY)
ALT: 11 U/L (ref 0–44)
AST: 22 U/L (ref 15–41)
Albumin: 3.5 g/dL (ref 3.5–5.0)
Alkaline Phosphatase: 54 U/L (ref 38–126)
Anion gap: 9 (ref 5–15)
BUN: 15 mg/dL (ref 6–20)
CO2: 26 mmol/L (ref 22–32)
Calcium: 8.4 mg/dL — ABNORMAL LOW (ref 8.9–10.3)
Chloride: 104 mmol/L (ref 98–111)
Creatinine: 0.77 mg/dL (ref 0.44–1.00)
GFR, Est AFR Am: >60 mL/min (ref >60)
GFR, Estimated: >60 mL/min (ref >60)
Glucose, Bld: 81 mg/dL (ref 70–99)
Potassium: 3.7 mmol/L (ref 3.5–5.1)
Sodium: 139 mmol/L (ref 135–145)
Total Bilirubin: 0.4 mg/dL (ref 0.3–1.2)
Total Protein: 5.6 g/dL — ABNORMAL LOW (ref 6.5–8.1)

## 2018-06-25 MED ORDER — SODIUM CHLORIDE 0.9 % IV SOLN
Freq: Once | INTRAVENOUS | Status: AC
  Administered 2018-06-25: 09:00:00 via INTRAVENOUS
  Filled 2018-06-25: qty 250

## 2018-06-25 MED ORDER — ACETAMINOPHEN 325 MG PO TABS
650.0000 mg | ORAL_TABLET | Freq: Once | ORAL | Status: AC
  Administered 2018-06-25: 650 mg via ORAL

## 2018-06-25 MED ORDER — PROCHLORPERAZINE MALEATE 10 MG PO TABS
ORAL_TABLET | ORAL | Status: AC
  Filled 2018-06-25: qty 1

## 2018-06-25 MED ORDER — SODIUM CHLORIDE 0.9 % IV SOLN
15.2000 mg/kg | Freq: Once | INTRAVENOUS | Status: AC
  Administered 2018-06-25: 900 mg via INTRAVENOUS
  Filled 2018-06-25: qty 40

## 2018-06-25 MED ORDER — SODIUM CHLORIDE 0.9% FLUSH
10.0000 mL | INTRAVENOUS | Status: DC | PRN
  Administered 2018-06-25: 12:00:00 10 mL
  Filled 2018-06-25: qty 10

## 2018-06-25 MED ORDER — DIPHENHYDRAMINE HCL 25 MG PO CAPS
ORAL_CAPSULE | ORAL | Status: AC
  Filled 2018-06-25: qty 2

## 2018-06-25 MED ORDER — ACETAMINOPHEN 325 MG PO TABS
ORAL_TABLET | ORAL | Status: AC
  Filled 2018-06-25: qty 2

## 2018-06-25 MED ORDER — HEPARIN SOD (PORK) LOCK FLUSH 100 UNIT/ML IV SOLN
500.0000 [IU] | Freq: Once | INTRAVENOUS | Status: AC | PRN
  Administered 2018-06-25: 500 [IU]
  Filled 2018-06-25: qty 5

## 2018-06-25 MED ORDER — SODIUM CHLORIDE 0.9 % IV SOLN
20.0000 mg | Freq: Once | INTRAVENOUS | Status: AC
  Administered 2018-06-25: 20 mg via INTRAVENOUS
  Filled 2018-06-25: qty 20

## 2018-06-25 MED ORDER — DIPHENHYDRAMINE HCL 25 MG PO CAPS
50.0000 mg | ORAL_CAPSULE | Freq: Once | ORAL | Status: AC
  Administered 2018-06-25: 50 mg via ORAL

## 2018-06-25 MED ORDER — PROCHLORPERAZINE MALEATE 10 MG PO TABS
10.0000 mg | ORAL_TABLET | Freq: Once | ORAL | Status: AC
  Administered 2018-06-25: 10 mg via ORAL

## 2018-06-25 NOTE — Progress Notes (Signed)
[place] OFFICE PROGRESS NOTE  Patient Care Team: [name], MD as PCP - General [name], MD as Consulting Physician (Oncology) [name], MD as Consulting Physician (Neurosurgery) [name], MD as Consulting Physician (Hematology and Oncology) [name], [name], MD as Referring Physician (Hematology and Oncology)  ASSESSMENT & PLAN:  Multiple myeloma in relapse [facility]) Repeat PET scan showed no evidence of new bone lesion Her serum light chains and urine study confirmed slight disease progression but she is not symptomatic I have reviewed recommendation from [facility] and from [facility] Currently, she does not qualify for clinical trial but she may be in the future She would like to continue treatment for now We discussed the plan for now will be to proceed with monthly daratumumab along with pomalidomide, with dose adjustment as needed to her pancytopenia  Pancytopenia due to antineoplastic chemotherapy [facility]) She has intermittent pancytopenia due to treatment For now, she will remain on 3 mg of pomalidomide If her pancytopenia is worse, we will consider reducing the dose to 2 mg dose   No orders of the defined types were placed in this encounter.   INTERVAL HISTORY: Please see below for problem oriented charting. She returns for chemotherapy and follow-up She was seen at [facility] recently I have reviewed documentation extensively She denies new bone pain No recent infection, fever or chills Denies infusion reaction The patient denies any recent signs or symptoms of bleeding such as spontaneous epistaxis, hematuria or hematochezia.   SUMMARY OF ONCOLOGIC HISTORY:   Multiple myeloma in relapse ([place])   07/21/2007 Bone Marrow Biopsy    Case #: [id number] Bone marrow showed myeloma    07/21/2007 Miscellaneous    She was diagnosed in May 2009. Had several cycles of RVD    10/14/2007 Bone Marrow Biopsy    Case #: [id number]  repeat bone marrow biopsy showed good response to Rx    12/31/2007 Bone Marrow Transplant    She had [facility] BMT at [facility]    07/19/2009 Bone Marrow Biopsy    Case #: [id number] Bone marrow biopsy showed only 1% plasma cells    10/10/2010 Bone Marrow Biopsy    [id number] Bone marrow biopsy showed 2% plasma cells    09/25/2011 Bone Marrow Biopsy    Accession #: [id number] Bone marrow only showed 4% plasma cells with ligh chain excess    10/14/2011 - 06/13/2013 Chemotherapy    She received Revlimid only, discontinued due to pancytopenia    06/18/2015 - 09/13/2015 Chemotherapy    She resumed taking Revlimid and dexamethasone. Treatment is stopped due to minimum response and pancytopenia    10/19/2015 -  Chemotherapy    The patient had Velcade for chemotherapy treatment along with weekly Daratumumab. Last dose of Velcade on 02/29/16, stopped due to progression. Pomalidomide added on 03/14/16    04/11/2016 Adverse Reaction    Delay resumption of Pomalyst cycle 2 until 04/16/16 due to neutropenia    09/16/2016 PET scan    1. No findings of active bony lymphoma. Prior activity in the sternum and thoracic spine has resolved. 2. Focal hypermetabolic activity along the sigmoid colon with some local inflammatory stranding in the adjacent mesentery, maximum SUV 11.0. This is probably from mild acute diverticulitis. There is no hypermetabolic activity in this vicinity 4 months ago to suggest that this is from colon cancer. Correlate with any symptoms. 3. Acute right maxillary sinusitis. 4. Thoracolumbar compression fractures, vertebral augmentation at the L1 level.  09/17/2016 Procedure    CT guided bone marrow biopsy of right posterior iliac bone with both aspirate and core samples obtained.    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 - TRILINEAGE HEMATOPOIESIS. - SEE  COMMENT. PERIPHERAL BLOOD: - LYMPHOPENIA. - THROMBOCYTOPENIA. Diagnosis Note The bone marrow is slightly hypocellular for age with trilineage hematopoiesis and non-specific myeloid changes likely related to previous therapy. Plasma cells are increased in number representing 8% of all cells in the aspirate, but with lack of large aggregates or sheets in the clot and biopsy sections. Immunohistochemical stains highlight the slightly increased plasma cell component in the marrow which shows kappa light chain restriction consistent with residual/recurrent plasma cell neoplasm. Correlation with cytogenetic and FISH studies recommended    09/17/2016 Pathology Results    Normal bone marrow cytogenetics and FISH    04/06/2018 PET scan    1. No abnormal focus of increased radiotracer uptake identified to suggest active disease or progression of disease. 2. Similar appearance of thoracic and lumbar compression deformities with associated kyphosis deformity. 3. Thickening of the endometrium is again noted. This is indeterminate but is considered abnormal in a postmenopausal female. Consider further evaluation with pelvic sonogram.      REVIEW OF SYSTEMS:   Constitutional: Denies fevers, chills or abnormal weight loss Eyes: Denies blurriness of vision Ears, nose, mouth, throat, and face: Denies mucositis or sore throat Respiratory: Denies cough, dyspnea or wheezes Cardiovascular: Denies palpitation, chest discomfort or lower extremity swelling Gastrointestinal:  Denies nausea, heartburn or change in bowel habits Skin: Denies abnormal skin rashes Lymphatics: Denies new lymphadenopathy or easy bruising Neurological:Denies numbness, tingling or new weaknesses Behavioral/Psych: Mood is stable, no new changes  All other systems were reviewed with the patient and are negative.  I have reviewed the past medical history, past surgical history, social history and family history with the patient and they  are unchanged from previous note.  ALLERGIES:  is allergic to hydromorphone.  MEDICATIONS:  Current Outpatient Medications  Medication Sig Dispense Refill  . albuterol (PROVENTIL HFA;VENTOLIN HFA) 108 (90 Base) MCG/ACT inhaler Inhale 2 puffs into the lungs every 4 (four) hours as needed for wheezing or shortness of breath. 1 Inhaler 2  . aspirin 81 MG tablet Take 81 mg by mouth.    . cholecalciferol (VITAMIN D) 1000 UNITS tablet Take 1,000 Units by mouth daily.    . Multiple Vitamins-Minerals (ONE-A-DAY 50 PLUS PO) Take by mouth daily.    Marland Kitchen oxycodone (OXY-IR) 5 MG capsule Take 5 mg by mouth every 4 (four) hours as needed. pain    . Oxycodone HCl (OXYCONTIN) 60 MG TB12 Take 1 tablet by mouth 2 (two) times daily.    . pantoprazole (PROTONIX) 40 MG tablet TAKE (1) TABLET DAILY AS NEEDED. 30 tablet 0  . POMALYST 3 MG capsule TAKE 1 CAPSULE BY MOUTH ONCE DAILY FOR 21 DAYS ON AND 7 DAYS OFF 21 capsule 0  . valACYclovir (VALTREX) 1000 MG tablet Take 1 tablet (1,000 mg total) by mouth daily. 90 tablet 11   No current facility-administered medications for this visit.     PHYSICAL EXAMINATION: ECOG PERFORMANCE STATUS: 1 - Symptomatic but completely ambulatory  GENERAL:alert, no distress and comfortable SKIN: skin color, texture, turgor are normal, no rashes or significant lesions EYES: normal, Conjunctiva are pink and non-injected, sclera clear OROPHARYNX:no exudate, no erythema and lips, buccal mucosa, and tongue normal  NECK: supple, thyroid normal size, non-tender, without nodularity LYMPH:  no palpable lymphadenopathy in the cervical, axillary or inguinal LUNGS: clear to auscultation and percussion with normal breathing effort HEART: regular rate & rhythm and no murmurs and no lower extremity edema ABDOMEN:abdomen soft, non-tender and normal bowel sounds Musculoskeletal:no cyanosis of digits and no clubbing  NEURO: alert & oriented x 3 with fluent speech, no focal motor/sensory  deficits  LABORATORY DATA:  I have reviewed the data as listed    Component Value Date/Time   NA 139 06/25/2018 0750   NA 140 03/11/2017 1150   K 3.7 06/25/2018 0750   K 3.9 03/11/2017 1150   CL 104 06/25/2018 0750   CL 107 08/30/2012 1055   CO2 26 06/25/2018 0750   CO2 29 03/11/2017 1150   GLUCOSE 81 06/25/2018 0750   GLUCOSE 93 03/11/2017 1150   GLUCOSE 102 (H) 08/30/2012 1055   BUN 15 06/25/2018 0750   BUN 12.5 03/11/2017 1150   CREATININE 0.77 06/25/2018 0750   CREATININE 0.8 03/11/2017 1150   CALCIUM 8.4 (L) 06/25/2018 0750   CALCIUM 8.7 03/11/2017 1150   PROT 5.6 (L) 06/25/2018 0750   PROT 6.0 (L) 03/11/2017 1150   ALBUMIN 3.5 06/25/2018 0750   ALBUMIN 3.8 03/11/2017 1150   AST 22 06/25/2018 0750   AST 18 03/11/2017 1150   ALT 11 06/25/2018 0750   ALT 14 03/11/2017 1150   ALKPHOS 54 06/25/2018 0750   ALKPHOS 45 03/11/2017 1150   BILITOT 0.4 06/25/2018 0750   BILITOT 0.37 03/11/2017 1150   GFRNONAA >60 06/25/2018 0750   GFRAA >60 06/25/2018 0750    No results found for: SPEP, UPEP  Lab Results  Component Value Date   WBC 4.4 06/25/2018   NEUTROABS 2.2 06/25/2018   HGB 11.4 (L) 06/25/2018   HCT 34.8 (L) 06/25/2018   MCV 106.7 (H) 06/25/2018   PLT 113 (L) 06/25/2018      Chemistry      Component Value Date/Time   NA 139 06/25/2018 0750   NA 140 03/11/2017 1150   K 3.7 06/25/2018 0750   K 3.9 03/11/2017 1150   CL 104 06/25/2018 0750   CL 107 08/30/2012 1055   CO2 26 06/25/2018 0750   CO2 29 03/11/2017 1150   BUN 15 06/25/2018 0750   BUN 12.5 03/11/2017 1150   CREATININE 0.77 06/25/2018 0750   CREATININE 0.8 03/11/2017 1150      Component Value Date/Time   CALCIUM 8.4 (L) 06/25/2018 0750   CALCIUM 8.7 03/11/2017 1150   ALKPHOS 54 06/25/2018 0750   ALKPHOS 45 03/11/2017 1150   AST 22 06/25/2018 0750   AST 18 03/11/2017 1150   ALT 11 06/25/2018 0750   ALT 14 03/11/2017 1150   BILITOT 0.4 06/25/2018 0750   BILITOT 0.37 03/11/2017 1150       All questions were answered. The patient knows to call the clinic with any problems, questions or concerns. No barriers to learning was detected.  I spent 15 minutes counseling the patient face to face. The total time spent in the appointment was 20 minutes and more than 50% was on counseling and review of test results  [name], MD 06/25/2018 12:51 PM

## 2018-06-25 NOTE — Assessment & Plan Note (Signed)
Repeat PET scan showed no evidence of new bone lesion Her serum light chains and urine study confirmed slight disease progression but she is not symptomatic I have reviewed recommendation from [facility] and from [facility] Currently, she does not qualify for clinical trial but she may be in the future She would like to continue treatment for now We discussed the plan for now will be to proceed with monthly daratumumab along with pomalidomide, with dose adjustment as needed to her pancytopenia

## 2018-06-25 NOTE — Assessment & Plan Note (Signed)
She has intermittent pancytopenia due to treatment For now, she will remain on 3 mg of pomalidomide If her pancytopenia is worse, we will consider reducing the dose to 2 mg dose

## 2018-06-25 NOTE — Patient Instructions (Signed)
[facility] Discharge Instructions for Patients Receiving Chemotherapy  Today you received the following chemotherapy agents: Darzalex  To help prevent nausea and vomiting after your treatment, we encourage you to take your nausea medication as directed.    If you develop nausea and vomiting that is not controlled by your nausea medication, call the clinic.   BELOW ARE SYMPTOMS THAT SHOULD BE REPORTED IMMEDIATELY:  *FEVER GREATER THAN 100.5 F  *CHILLS WITH OR WITHOUT FEVER  NAUSEA AND VOMITING THAT IS NOT CONTROLLED WITH YOUR NAUSEA MEDICATION  *UNUSUAL SHORTNESS OF BREATH  *UNUSUAL BRUISING OR BLEEDING  TENDERNESS IN MOUTH AND THROAT WITH OR WITHOUT PRESENCE OF ULCERS  *URINARY PROBLEMS  *BOWEL PROBLEMS  UNUSUAL RASH Items with * indicate a potential emergency and should be followed up as soon as possible.  Feel free to call the clinic should you have any questions or concerns. The clinic phone number is [phone].  Please show the CHEMO ALERT CARD at check-in to the Emergency Department and triage nurse.   

## 2018-06-28 LAB — KAPPA/LAMBDA LIGHT CHAINS
Kappa free light chain: 102.2 mg/L — ABNORMAL HIGH (ref 3.3–19.4)
Kappa, lambda light chain ratio: 36.5 — ABNORMAL HIGH (ref 0.26–1.65)
Lambda free light chains: 2.8 mg/L — ABNORMAL LOW (ref 5.7–26.3)

## 2018-06-28 LAB — MULTIPLE MYELOMA PANEL, SERUM
Albumin SerPl Elph-Mcnc: 3.5 g/dL (ref 2.9–4.4)
Albumin/Glob SerPl: 2.1 — ABNORMAL HIGH (ref 0.7–1.7)
Alpha 1: 0.2 g/dL (ref 0.0–0.4)
Alpha2 Glob SerPl Elph-Mcnc: 0.6 g/dL (ref 0.4–1.0)
B-Globulin SerPl Elph-Mcnc: 0.7 g/dL (ref 0.7–1.3)
Gamma Glob SerPl Elph-Mcnc: 0.3 g/dL — ABNORMAL LOW (ref 0.4–1.8)
Globulin, Total: 1.7 g/dL — ABNORMAL LOW (ref 2.2–3.9)
IgA: 9 mg/dL — ABNORMAL LOW (ref 87–352)
IgG (Immunoglobin G), Serum: 301 mg/dL — ABNORMAL LOW (ref 586–1602)
IgM (Immunoglobulin M), Srm: 9 mg/dL — ABNORMAL LOW (ref 26–217)
M Protein SerPl Elph-Mcnc: 0.1 g/dL — ABNORMAL HIGH
Total Protein ELP: 5.2 g/dL — ABNORMAL LOW (ref 6.0–8.5)

## 2018-06-29 ENCOUNTER — Other Ambulatory Visit: Payer: Self-pay

## 2018-06-29 ENCOUNTER — Telehealth: Payer: Self-pay

## 2018-06-29 MED ORDER — POMALIDOMIDE 3 MG PO CAPS: ORAL_CAPSULE | ORAL | 0 refills | Status: DC

## 2018-06-29 NOTE — Telephone Encounter (Signed)
Spoke with pt by phone and gave light chain results

## 2018-07-23 ENCOUNTER — Inpatient Hospital Stay: Payer: 59 | Attending: Hematology and Oncology

## 2018-07-23 ENCOUNTER — Other Ambulatory Visit: Payer: Self-pay

## 2018-07-23 ENCOUNTER — Encounter: Payer: Self-pay | Admitting: Hematology and Oncology

## 2018-07-23 ENCOUNTER — Inpatient Hospital Stay (HOSPITAL_BASED_OUTPATIENT_CLINIC_OR_DEPARTMENT_OTHER): Payer: 59 | Admitting: Hematology and Oncology

## 2018-07-23 ENCOUNTER — Inpatient Hospital Stay: Payer: 59

## 2018-07-23 VITALS — BP 132/66 | HR 88 | Temp 98.0°F | Resp 16 | Wt 128.5 lb

## 2018-07-23 DIAGNOSIS — C9002 Multiple myeloma in relapse: Secondary | ICD-10-CM | POA: Insufficient documentation

## 2018-07-23 DIAGNOSIS — Z79899 Other long term (current) drug therapy: Secondary | ICD-10-CM | POA: Diagnosis not present

## 2018-07-23 DIAGNOSIS — D61810 Antineoplastic chemotherapy induced pancytopenia: Secondary | ICD-10-CM | POA: Insufficient documentation

## 2018-07-23 DIAGNOSIS — T451X5A Adverse effect of antineoplastic and immunosuppressive drugs, initial encounter: Secondary | ICD-10-CM

## 2018-07-23 LAB — CBC WITH DIFFERENTIAL/PLATELET
Abs Immature Granulocytes: 0 10*3/uL (ref 0.00–0.07)
Basophils Absolute: 0.1 10*3/uL (ref 0.0–0.1)
Basophils Relative: 3 %
Eosinophils Absolute: 0.3 10*3/uL (ref 0.0–0.5)
Eosinophils Relative: 8 %
HCT: 36.1 % (ref 36.0–46.0)
Hemoglobin: 12 g/dL (ref 12.0–15.0)
Immature Granulocytes: 0 %
Lymphocytes Relative: 30 %
Lymphs Abs: 1.3 10*3/uL (ref 0.7–4.0)
MCH: 35.1 pg — ABNORMAL HIGH (ref 26.0–34.0)
MCHC: 33.2 g/dL (ref 30.0–36.0)
MCV: 105.6 fL — ABNORMAL HIGH (ref 80.0–100.0)
Monocytes Absolute: 0.7 10*3/uL (ref 0.1–1.0)
Monocytes Relative: 16 %
Neutro Abs: 1.8 10*3/uL (ref 1.7–7.7)
Neutrophils Relative %: 43 %
Platelets: 107 10*3/uL — ABNORMAL LOW (ref 150–400)
RBC: 3.42 MIL/uL — ABNORMAL LOW (ref 3.87–5.11)
RDW: 13.5 % (ref 11.5–15.5)
WBC: 4.2 10*3/uL (ref 4.0–10.5)
nRBC: 0 % (ref 0.0–0.2)

## 2018-07-23 LAB — CMP (CANCER CENTER ONLY)
ALT: 16 U/L (ref 0–44)
AST: 21 U/L (ref 15–41)
Albumin: 4 g/dL (ref 3.5–5.0)
Alkaline Phosphatase: 55 U/L (ref 38–126)
Anion gap: 8 (ref 5–15)
BUN: 13 mg/dL (ref 6–20)
CO2: 28 mmol/L (ref 22–32)
Calcium: 8.5 mg/dL — ABNORMAL LOW (ref 8.9–10.3)
Chloride: 104 mmol/L (ref 98–111)
Creatinine: 0.77 mg/dL (ref 0.44–1.00)
GFR, Est AFR Am: >60 mL/min (ref >60)
GFR, Estimated: >60 mL/min (ref >60)
Glucose, Bld: 90 mg/dL (ref 70–99)
Potassium: 3.6 mmol/L (ref 3.5–5.1)
Sodium: 140 mmol/L (ref 135–145)
Total Bilirubin: 0.5 mg/dL (ref 0.3–1.2)
Total Protein: 5.9 g/dL — ABNORMAL LOW (ref 6.5–8.1)

## 2018-07-23 MED ORDER — SODIUM CHLORIDE 0.9 % IV SOLN
20.0000 mg | Freq: Once | INTRAVENOUS | Status: AC
  Administered 2018-07-23: 20 mg via INTRAVENOUS
  Filled 2018-07-23: qty 2

## 2018-07-23 MED ORDER — SODIUM CHLORIDE 0.9 % IV SOLN
15.2000 mg/kg | Freq: Once | INTRAVENOUS | Status: AC
  Administered 2018-07-23: 900 mg via INTRAVENOUS
  Filled 2018-07-23: qty 40

## 2018-07-23 MED ORDER — DIPHENHYDRAMINE HCL 25 MG PO CAPS
50.0000 mg | ORAL_CAPSULE | Freq: Once | ORAL | Status: AC
  Administered 2018-07-23: 50 mg via ORAL

## 2018-07-23 MED ORDER — SODIUM CHLORIDE 0.9 % IV SOLN
Freq: Once | INTRAVENOUS | Status: AC
  Administered 2018-07-23: 09:00:00 via INTRAVENOUS
  Filled 2018-07-23: qty 250

## 2018-07-23 MED ORDER — PROCHLORPERAZINE MALEATE 10 MG PO TABS
ORAL_TABLET | ORAL | Status: AC
  Filled 2018-07-23: qty 1

## 2018-07-23 MED ORDER — HEPARIN SOD (PORK) LOCK FLUSH 100 UNIT/ML IV SOLN
500.0000 [IU] | Freq: Once | INTRAVENOUS | Status: AC | PRN
  Administered 2018-07-23: 500 [IU]
  Filled 2018-07-23: qty 5

## 2018-07-23 MED ORDER — ACETAMINOPHEN 325 MG PO TABS
ORAL_TABLET | ORAL | Status: AC
  Filled 2018-07-23: qty 2

## 2018-07-23 MED ORDER — PANTOPRAZOLE SODIUM 40 MG PO TBEC: DELAYED_RELEASE_TABLET | ORAL | 11 refills | Status: AC

## 2018-07-23 MED ORDER — SODIUM CHLORIDE 0.9% FLUSH
10.0000 mL | INTRAVENOUS | Status: DC | PRN
  Administered 2018-07-23: 10 mL
  Filled 2018-07-23: qty 10

## 2018-07-23 MED ORDER — PROCHLORPERAZINE MALEATE 10 MG PO TABS
10.0000 mg | ORAL_TABLET | Freq: Once | ORAL | Status: AC
  Administered 2018-07-23: 10 mg via ORAL

## 2018-07-23 MED ORDER — DIPHENHYDRAMINE HCL 25 MG PO CAPS
ORAL_CAPSULE | ORAL | Status: AC
  Filled 2018-07-23: qty 2

## 2018-07-23 MED ORDER — ACETAMINOPHEN 325 MG PO TABS
650.0000 mg | ORAL_TABLET | Freq: Once | ORAL | Status: AC
  Administered 2018-07-23: 650 mg via ORAL

## 2018-07-23 NOTE — Progress Notes (Signed)
[facility] OFF[facility] PROGRESS NOTE  Patient Care Team: [name], MD as PCP - General [name], MD as Consulting Physician (Oncology) [name], MD as Consulting Physician (Neurosurgery) [name], MD as Consulting Physician (Hematology and Oncology) [name], [name], MD as Referring Physician (Hematology and Oncology)  ASSESSMENT & PLAN:  Multiple myeloma in relapse [facility]) Repeat PET scan showed no evidence of new bone lesion Her serum light chains and urine study confirmed slight disease progression but she is not symptomatic Currently, she does not qualify for clinical trial but she may be in the future She would like to continue treatment for now We discussed the plan for now will be to proceed with monthly daratumumab along with pomalidomide, with dose adjustment as needed to her pancytopenia She receives Zometa every 6 months, next due in July  Pancytopenia due to antineoplastic chemotherapy [facility]) She has intermittent pancytopenia due to treatment For now, she will remain on 3 mg of pomalidomide If her pancytopenia is worse, we will consider reducing the dose to 2 mg dose   No orders of the defined types were placed in this encounter.   INTERVAL HISTORY: Please see below for problem oriented charting. She returns for further follow-up and treatment She tolerated chemotherapy well No infusion reaction No recent infection, fever or chills No new bone pain.  No recent dental issues.  SUMMARY OF ONCOLOGIC HISTORY:   Multiple myeloma in relapse ([place])   07/21/2007 Bone Marrow Biopsy    Case #: [id number] Bone marrow showed myeloma    07/21/2007 Miscellaneous    She was diagnosed in May 2009. Had several cycles of RVD    10/14/2007 Bone Marrow Biopsy    Case #: [id number] repeat bone marrow biopsy showed good response to Rx    12/31/2007 Bone Marrow Transplant    She had autologous BMT at [facility]    07/19/2009 Bone Marrow Biopsy     Case #: [id number] Bone marrow biopsy showed only 1% plasma cells    10/10/2010 Bone Marrow Biopsy    [id number] Bone marrow biopsy showed 2% plasma cells    09/25/2011 Bone Marrow Biopsy    Accession #: [id number] Bone marrow only showed 4% plasma cells with ligh chain excess    10/14/2011 - 06/13/2013 Chemotherapy    She received Revlimid only, discontinued due to pancytopenia    06/18/2015 - 09/13/2015 Chemotherapy    She resumed taking Revlimid and dexamethasone. Treatment is stopped due to minimum response and pancytopenia    10/19/2015 -  Chemotherapy    The patient had Velcade for chemotherapy treatment along with weekly Daratumumab. Last dose of Velcade on 02/29/16, stopped due to progression. Pomalidomide added on 03/14/16    04/11/2016 Adverse Reaction    Delay resumption of Pomalyst cycle 2 until 04/16/16 due to neutropenia    09/16/2016 PET scan    1. No findings of active bony lymphoma. Prior activity in the sternum and thoracic spine has resolved. 2. Focal hypermetabolic activity along the sigmoid colon with some local inflammatory stranding in the adjacent mesentery, maximum SUV 11.0. This is probably from mild acute diverticulitis. There is no hypermetabolic activity in this vicinity 4 months ago to suggest that this is from colon cancer. Correlate with any symptoms. 3. Acute right maxillary sinusitis. 4. Thoracolumbar compression fractures, vertebral augmentation at the L1 level.    09/17/2016 Procedure    CT guided bone marrow biopsy of right posterior iliac bone with both aspirate and core  samples obtained.    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 - TRILINEAGE HEMATOPOIESIS. - SEE COMMENT. PERIPHERAL BLOOD: - LYMPHOPENIA. - THROMBOCYTOPENIA. Diagnosis Note The bone marrow is slightly hypocellular for age with trilineage hematopoiesis and non-specific myeloid  changes likely related to previous therapy. Plasma cells are increased in number representing 8% of all cells in the aspirate, but with lack of large aggregates or sheets in the clot and biopsy sections. Immunohistochemical stains highlight the slightly increased plasma cell component in the marrow which shows kappa light chain restriction consistent with residual/recurrent plasma cell neoplasm. Correlation with cytogenetic and FISH studies recommended    09/17/2016 Pathology Results    Normal bone marrow cytogenetics and FISH    04/06/2018 PET scan    1. No abnormal focus of increased radiotracer uptake identified to suggest active disease or progression of disease. 2. Similar appearance of thoracic and lumbar compression deformities with associated kyphosis deformity. 3. Thickening of the endometrium is again noted. This is indeterminate but is considered abnormal in a postmenopausal female. Consider further evaluation with pelvic sonogram.      REVIEW OF SYSTEMS:   Constitutional: Denies fevers, chills or abnormal weight loss Eyes: Denies blurriness of vision Ears, nose, mouth, throat, and face: Denies mucositis or sore throat Respiratory: Denies cough, dyspnea or wheezes Cardiovascular: Denies palpitation, chest discomfort or lower extremity swelling Gastrointestinal:  Denies nausea, heartburn or change in bowel habits Skin: Denies abnormal skin rashes Lymphatics: Denies new lymphadenopathy or easy bruising Neurological:Denies numbness, tingling or new weaknesses Behavioral/Psych: Mood is stable, no new changes  All other systems were reviewed with the patient and are negative.  I have reviewed the past medical history, past surgical history, social history and family history with the patient and they are unchanged from previous note.  ALLERGIES:  is allergic to hydromorphone.  MEDICATIONS:  Current Outpatient Medications  Medication Sig Dispense Refill  . albuterol (PROVENTIL  HFA;VENTOLIN HFA) 108 (90 Base) MCG/ACT inhaler Inhale 2 puffs into the lungs every 4 (four) hours as needed for wheezing or shortness of breath. 1 Inhaler 2  . aspirin 81 MG tablet Take 81 mg by mouth.    . cholecalciferol (VITAMIN D) 1000 UNITS tablet Take 1,000 Units by mouth daily.    . Multiple Vitamins-Minerals (ONE-A-DAY 50 PLUS PO) Take by mouth daily.    Marland Kitchen oxycodone (OXY-IR) 5 MG capsule Take 5 mg by mouth every 4 (four) hours as needed. pain    . Oxycodone HCl (OXYCONTIN) 60 MG TB12 Take 1 tablet by mouth 2 (two) times daily.    . pantoprazole (PROTONIX) 40 MG tablet TAKE (1) TABLET DAILY AS NEEDED. 90 tablet 11  . pomalidomide (POMALYST) 3 MG capsule TAKE 1 CAPSULE BY MOUTH ONCE DAILY FOR 21 DAYS ON AND 7 DAYS OFF 21 capsule 0  . valACYclovir (VALTREX) 1000 MG tablet Take 1 tablet (1,000 mg total) by mouth daily. 90 tablet 11   No current facility-administered medications for this visit.     PHYSICAL EXAMINATION: ECOG PERFORMANCE STATUS: 1 - Symptomatic but completely ambulatory GENERAL:alert, no distress and comfortable SKIN: skin color, texture, turgor are normal, no rashes or significant lesions EYES: normal, Conjunctiva are pink and non-injected, sclera clear OROPHARYNX:no exudate, no erythema and lips, buccal mucosa, and tongue normal  NECK: supple, thyroid normal size, non-tender, without nodularity LYMPH:  no palpable lymphadenopathy in the cervical, axillary or inguinal  LUNGS: clear to auscultation and percussion with normal breathing effort HEART: regular rate & rhythm and no murmurs and no lower extremity edema ABDOMEN:abdomen soft, non-tender and normal bowel sounds Musculoskeletal:no cyanosis of digits and no clubbing  NEURO: alert & oriented x 3 with fluent speech, no focal motor/sensory deficits  LABORATORY DATA:  I have reviewed the data as listed    Component Value Date/Time   NA 140 07/23/2018 0808   NA 140 03/11/2017 1150   K 3.6 07/23/2018 0808   K 3.9  03/11/2017 1150   CL 104 07/23/2018 0808   CL 107 08/30/2012 1055   CO2 28 07/23/2018 0808   CO2 29 03/11/2017 1150   GLUCOSE 90 07/23/2018 0808   GLUCOSE 93 03/11/2017 1150   GLUCOSE 102 (H) 08/30/2012 1055   BUN 13 07/23/2018 0808   BUN 12.5 03/11/2017 1150   CREATININE 0.77 07/23/2018 0808   CREATININE 0.8 03/11/2017 1150   CALCIUM 8.5 (L) 07/23/2018 0808   CALCIUM 8.7 03/11/2017 1150   PROT 5.9 (L) 07/23/2018 0808   PROT 6.0 (L) 03/11/2017 1150   ALBUMIN 4.0 07/23/2018 0808   ALBUMIN 3.8 03/11/2017 1150   AST 21 07/23/2018 0808   AST 18 03/11/2017 1150   ALT 16 07/23/2018 0808   ALT 14 03/11/2017 1150   ALKPHOS 55 07/23/2018 0808   ALKPHOS 45 03/11/2017 1150   BILITOT 0.5 07/23/2018 0808   BILITOT 0.37 03/11/2017 1150   GFRNONAA >60 07/23/2018 0808   GFRAA >60 07/23/2018 0808    No results found for: SPEP, UPEP  Lab Results  Component Value Date   WBC 4.2 07/23/2018   NEUTROABS 1.8 07/23/2018   HGB 12.0 07/23/2018   HCT 36.1 07/23/2018   MCV 105.6 (H) 07/23/2018   PLT 107 (L) 07/23/2018      Chemistry      Component Value Date/Time   NA 140 07/23/2018 0808   NA 140 03/11/2017 1150   K 3.6 07/23/2018 0808   K 3.9 03/11/2017 1150   CL 104 07/23/2018 0808   CL 107 08/30/2012 1055   CO2 28 07/23/2018 0808   CO2 29 03/11/2017 1150   BUN 13 07/23/2018 0808   BUN 12.5 03/11/2017 1150   CREATININE 0.77 07/23/2018 0808   CREATININE 0.8 03/11/2017 1150      Component Value Date/Time   CALCIUM 8.5 (L) 07/23/2018 0808   CALCIUM 8.7 03/11/2017 1150   ALKPHOS 55 07/23/2018 0808   ALKPHOS 45 03/11/2017 1150   AST 21 07/23/2018 0808   AST 18 03/11/2017 1150   ALT 16 07/23/2018 0808   ALT 14 03/11/2017 1150   BILITOT 0.5 07/23/2018 0808   BILITOT 0.37 03/11/2017 1150      All questions were answered. The patient knows to call the clinic with any problems, questions or concerns. No barriers to learning was detected.  I spent 15 minutes counseling the  patient face to face. The total time spent in the appointment was 20 minutes and more than 50% was on counseling and review of test results  [name], MD 07/23/2018 8:47 AM

## 2018-07-23 NOTE — Assessment & Plan Note (Signed)
She has intermittent pancytopenia due to treatment For now, she will remain on 3 mg of pomalidomide If her pancytopenia is worse, we will consider reducing the dose to 2 mg dose

## 2018-07-23 NOTE — Assessment & Plan Note (Signed)
Repeat PET scan showed no evidence of new bone lesion Her serum light chains and urine study confirmed slight disease progression but she is not symptomatic Currently, she does not qualify for clinical trial but she may be in the future She would like to continue treatment for now We discussed the plan for now will be to proceed with monthly daratumumab along with pomalidomide, with dose adjustment as needed to her pancytopenia She receives Zometa every 6 months, next due in July

## 2018-07-23 NOTE — Patient Instructions (Signed)
[facility] Discharge Instructions for Patients Receiving Chemotherapy  Today you received the following chemotherapy agents Daratumumab (DARZALEX).  To help prevent nausea and vomiting after your treatment, we encourage you to take your nausea medication as prescribed.  If you develop nausea and vomiting that is not controlled by your nausea medication, call the clinic.   BELOW ARE SYMPTOMS THAT SHOULD BE REPORTED IMMEDIATELY:  *FEVER GREATER THAN 100.5 F  *CHILLS WITH OR WITHOUT FEVER  NAUSEA AND VOMITING THAT IS NOT CONTROLLED WITH YOUR NAUSEA MEDICATION  *UNUSUAL SHORTNESS OF BREATH  *UNUSUAL BRUISING OR BLEEDING  TENDERNESS IN MOUTH AND THROAT WITH OR WITHOUT PRESENCE OF ULCERS  *URINARY PROBLEMS  *BOWEL PROBLEMS  UNUSUAL RASH Items with * indicate a potential emergency and should be followed up as soon as possible.  Feel free to call the clinic should you have any questions or concerns. The clinic phone number is [phone].  Please show the CHEMO ALERT CARD at check-in to the Emergency Department and triage nurse.  Coronavirus (COVID-19) Are you at risk?  Are you at risk for the Coronavirus (COVID-19)?  To be considered HIGH RISK for Coronavirus (COVID-19), you have to meet the following criteria:  . Traveled to [place], [place], [place], [place] or [place]; or in the [place] to [place], [place], [place], or [place]; and have fever, cough, and shortness of breath within the last 2 weeks of travel OR . Been in close contact with a person diagnosed with COVID-19 within the last 2 weeks and have fever, cough, and shortness of breath . IF YOU DO NOT MEET THESE CRITERIA, YOU ARE CONSIDERED LOW RISK FOR COVID-19.  What to do if you are HIGH RISK for COVID-19?  . If you are having a medical emergency, call 911. . Seek medical care right away. Before you go to a doctor's office, urgent care or emergency department, call ahead and tell them  about your recent travel, contact with someone diagnosed with COVID-19, and your symptoms. You should receive instructions from your physician's office regarding next steps of care.  . When you arrive at healthcare provider, tell the healthcare staff immediately you have returned from visiting [place], [place], [place], [place] or [place]; or traveled in the [place] to [place], [place], [place], or [place]; in the last two weeks or you have been in close contact with a person diagnosed with COVID-19 in the last 2 weeks.   . Tell the health care staff about your symptoms: fever, cough and shortness of breath. . After you have been seen by a medical provider, you will be either: o Tested for (COVID-19) and discharged home on quarantine except to seek medical care if symptoms worsen, and asked to  - Stay home and avoid contact with others until you get your results (4-5 days)  - Avoid travel on public transportation if possible (such as bus, train, or airplane) or o Sent to the Emergency Department by EMS for evaluation, COVID-19 testing, and possible admission depending on your condition and test results.  What to do if you are LOW RISK for COVID-19?  Reduce your risk of any infection by using the same precautions used for avoiding the common cold or flu:  . Wash your hands often with soap and warm water for at least 20 seconds.  If soap and water are not readily available, use an alcohol-based hand sanitizer with at least 60% alcohol.  . If coughing or sneezing,   sneezing, cover your mouth and nose by coughing or sneezing into the elbow areas of your shirt or coat, into a tissue or into your sleeve (not your hands). . Avoid shaking hands with others and consider head nods or verbal greetings only. . Avoid touching your eyes, nose, or mouth with unwashed hands.  . Avoid close contact with people who are sick. . Avoid places or events with large numbers of people in one location, like concerts or  sporting events. . Carefully consider travel plans you have or are making. . If you are planning any travel outside or inside the [place], visit the CDC's Travelers' Health webpage for the latest health notices. . If you have some symptoms but not all symptoms, continue to monitor at home and seek medical attention if your symptoms worsen. . If you are having a medical emergency, call 911.   [place] / e-Visit: [email or web address]         [facility]: [facility]: [phone]                   [facility]: [phone]

## 2018-07-26 LAB — MULTIPLE MYELOMA PANEL, SERUM
Albumin SerPl Elph-Mcnc: 4.1 g/dL (ref 2.9–4.4)
Albumin/Glob SerPl: 2.6 — ABNORMAL HIGH (ref 0.7–1.7)
Alpha 1: 0.2 g/dL (ref 0.0–0.4)
Alpha2 Glob SerPl Elph-Mcnc: 0.6 g/dL (ref 0.4–1.0)
B-Globulin SerPl Elph-Mcnc: 0.6 g/dL — ABNORMAL LOW (ref 0.7–1.3)
Gamma Glob SerPl Elph-Mcnc: 0.2 g/dL — ABNORMAL LOW (ref 0.4–1.8)
Globulin, Total: 1.6 g/dL — ABNORMAL LOW (ref 2.2–3.9)
IgA: 8 mg/dL — ABNORMAL LOW (ref 87–352)
IgG (Immunoglobin G), Serum: 295 mg/dL — ABNORMAL LOW (ref 586–1602)
IgM (Immunoglobulin M), Srm: 7 mg/dL — ABNORMAL LOW (ref 26–217)
M Protein SerPl Elph-Mcnc: 0.1 g/dL — ABNORMAL HIGH
Total Protein ELP: 5.7 g/dL — ABNORMAL LOW (ref 6.0–8.5)

## 2018-07-26 LAB — KAPPA/LAMBDA LIGHT CHAINS
Kappa free light chain: 118.2 mg/L — ABNORMAL HIGH (ref 3.3–19.4)
Kappa, lambda light chain ratio: 49.25 — ABNORMAL HIGH (ref 0.26–1.65)
Lambda free light chains: 2.4 mg/L — ABNORMAL LOW (ref 5.7–26.3)

## 2018-07-27 ENCOUNTER — Telehealth: Payer: Self-pay

## 2018-07-27 NOTE — Telephone Encounter (Signed)
Called pt and gave below msg

## 2018-07-27 NOTE — Telephone Encounter (Signed)
-----   Message from [name], MD sent at 07/27/2018  8:35 AM EDT ----- Regarding: myeloma panel and light chains Pls let her know her myeloma panel and light chain studies are stable

## 2018-08-03 ENCOUNTER — Other Ambulatory Visit: Payer: Self-pay | Admitting: Hematology and Oncology

## 2018-08-03 NOTE — Telephone Encounter (Signed)
Pls refill electronically °

## 2018-08-20 ENCOUNTER — Inpatient Hospital Stay: Payer: 59

## 2018-08-20 ENCOUNTER — Inpatient Hospital Stay (HOSPITAL_BASED_OUTPATIENT_CLINIC_OR_DEPARTMENT_OTHER): Payer: 59 | Admitting: Hematology and Oncology

## 2018-08-20 ENCOUNTER — Encounter: Payer: Self-pay | Admitting: Hematology and Oncology

## 2018-08-20 ENCOUNTER — Other Ambulatory Visit: Payer: Self-pay | Admitting: Hematology and Oncology

## 2018-08-20 ENCOUNTER — Other Ambulatory Visit: Payer: Self-pay

## 2018-08-20 ENCOUNTER — Inpatient Hospital Stay: Payer: 59 | Attending: Hematology and Oncology

## 2018-08-20 VITALS — BP 147/82 | HR 93 | Temp 98.0°F | Resp 18

## 2018-08-20 DIAGNOSIS — D61810 Antineoplastic chemotherapy induced pancytopenia: Secondary | ICD-10-CM | POA: Diagnosis not present

## 2018-08-20 DIAGNOSIS — D801 Nonfamilial hypogammaglobulinemia: Secondary | ICD-10-CM | POA: Insufficient documentation

## 2018-08-20 DIAGNOSIS — T451X5A Adverse effect of antineoplastic and immunosuppressive drugs, initial encounter: Secondary | ICD-10-CM | POA: Diagnosis not present

## 2018-08-20 DIAGNOSIS — Z79899 Other long term (current) drug therapy: Secondary | ICD-10-CM

## 2018-08-20 DIAGNOSIS — C9002 Multiple myeloma in relapse: Secondary | ICD-10-CM

## 2018-08-20 DIAGNOSIS — Z7982 Long term (current) use of aspirin: Secondary | ICD-10-CM | POA: Diagnosis not present

## 2018-08-20 DIAGNOSIS — Z5112 Encounter for antineoplastic immunotherapy: Secondary | ICD-10-CM | POA: Diagnosis not present

## 2018-08-20 LAB — CBC WITH DIFFERENTIAL/PLATELET
Abs Immature Granulocytes: 0.01 10*3/uL (ref 0.00–0.07)
Basophils Absolute: 0.1 10*3/uL (ref 0.0–0.1)
Basophils Relative: 3 %
Eosinophils Absolute: 0.3 10*3/uL (ref 0.0–0.5)
Eosinophils Relative: 8 %
HCT: 36.7 % (ref 36.0–46.0)
Hemoglobin: 12.1 g/dL (ref 12.0–15.0)
Immature Granulocytes: 0 %
Lymphocytes Relative: 27 %
Lymphs Abs: 1.2 10*3/uL (ref 0.7–4.0)
MCH: 35.2 pg — ABNORMAL HIGH (ref 26.0–34.0)
MCHC: 33 g/dL (ref 30.0–36.0)
MCV: 106.7 fL — ABNORMAL HIGH (ref 80.0–100.0)
Monocytes Absolute: 0.6 10*3/uL (ref 0.1–1.0)
Monocytes Relative: 15 %
Neutro Abs: 2 10*3/uL (ref 1.7–7.7)
Neutrophils Relative %: 47 %
Platelets: 115 10*3/uL — ABNORMAL LOW (ref 150–400)
RBC: 3.44 MIL/uL — ABNORMAL LOW (ref 3.87–5.11)
RDW: 13.2 % (ref 11.5–15.5)
WBC: 4.2 10*3/uL (ref 4.0–10.5)
nRBC: 0 % (ref 0.0–0.2)

## 2018-08-20 LAB — COMPREHENSIVE METABOLIC PANEL
ALT: 14 U/L (ref 0–44)
AST: 20 U/L (ref 15–41)
Albumin: 4 g/dL (ref 3.5–5.0)
Alkaline Phosphatase: 52 U/L (ref 38–126)
Anion gap: 11 (ref 5–15)
BUN: 13 mg/dL (ref 6–20)
CO2: 26 mmol/L (ref 22–32)
Calcium: 8.6 mg/dL — ABNORMAL LOW (ref 8.9–10.3)
Chloride: 105 mmol/L (ref 98–111)
Creatinine, Ser: 0.75 mg/dL (ref 0.44–1.00)
GFR calc Af Amer: >60 mL/min (ref >60)
GFR calc non Af Amer: >60 mL/min (ref >60)
Glucose, Bld: 90 mg/dL (ref 70–99)
Potassium: 3.8 mmol/L (ref 3.5–5.1)
Sodium: 142 mmol/L (ref 135–145)
Total Bilirubin: 0.5 mg/dL (ref 0.3–1.2)
Total Protein: 5.8 g/dL — ABNORMAL LOW (ref 6.5–8.1)

## 2018-08-20 MED ORDER — SODIUM CHLORIDE 0.9 % IV SOLN
20.0000 mg | Freq: Once | INTRAVENOUS | Status: AC
  Administered 2018-08-20: 20 mg via INTRAVENOUS
  Filled 2018-08-20: qty 20

## 2018-08-20 MED ORDER — SODIUM CHLORIDE 0.9% FLUSH
10.0000 mL | INTRAVENOUS | Status: DC | PRN
  Administered 2018-08-20: 10 mL
  Filled 2018-08-20: qty 10

## 2018-08-20 MED ORDER — DIPHENHYDRAMINE HCL 25 MG PO CAPS
50.0000 mg | ORAL_CAPSULE | Freq: Once | ORAL | Status: AC
  Administered 2018-08-20: 50 mg via ORAL

## 2018-08-20 MED ORDER — PROCHLORPERAZINE MALEATE 10 MG PO TABS
ORAL_TABLET | ORAL | Status: AC
  Filled 2018-08-20: qty 1

## 2018-08-20 MED ORDER — ACETAMINOPHEN 325 MG PO TABS
ORAL_TABLET | ORAL | Status: AC
  Filled 2018-08-20: qty 2

## 2018-08-20 MED ORDER — SODIUM CHLORIDE 0.9 % IV SOLN
Freq: Once | INTRAVENOUS | Status: AC
  Administered 2018-08-20: 10:00:00 via INTRAVENOUS
  Filled 2018-08-20: qty 250

## 2018-08-20 MED ORDER — HEPARIN SOD (PORK) LOCK FLUSH 100 UNIT/ML IV SOLN
500.0000 [IU] | Freq: Once | INTRAVENOUS | Status: AC | PRN
  Administered 2018-08-20: 500 [IU]
  Filled 2018-08-20: qty 5

## 2018-08-20 MED ORDER — DIPHENHYDRAMINE HCL 25 MG PO CAPS
ORAL_CAPSULE | ORAL | Status: AC
  Filled 2018-08-20: qty 2

## 2018-08-20 MED ORDER — ACETAMINOPHEN 325 MG PO TABS
650.0000 mg | ORAL_TABLET | Freq: Once | ORAL | Status: AC
  Administered 2018-08-20: 650 mg via ORAL

## 2018-08-20 MED ORDER — SODIUM CHLORIDE 0.9 % IV SOLN
15.2000 mg/kg | Freq: Once | INTRAVENOUS | Status: AC
  Administered 2018-08-20: 900 mg via INTRAVENOUS
  Filled 2018-08-20: qty 40

## 2018-08-20 MED ORDER — PROCHLORPERAZINE MALEATE 10 MG PO TABS
10.0000 mg | ORAL_TABLET | Freq: Once | ORAL | Status: AC
  Administered 2018-08-20: 10 mg via ORAL

## 2018-08-20 NOTE — Assessment & Plan Note (Signed)
She has intermittent pancytopenia due to treatment For now, she will remain on 3 mg of pomalidomide If her pancytopenia is worse, we will consider reducing the dose to 2 mg dose

## 2018-08-20 NOTE — Assessment & Plan Note (Signed)
She is vulnerable to infection We discussed neutropenic precaution

## 2018-08-20 NOTE — Patient Instructions (Signed)
[facility] Discharge Instructions for Patients Receiving Chemotherapy  Today you received the following chemotherapy agents Daratumumab (DARZALEX).  To help prevent nausea and vomiting after your treatment, we encourage you to take your nausea medication as prescribed.  If you develop nausea and vomiting that is not controlled by your nausea medication, call the clinic.   BELOW ARE SYMPTOMS THAT SHOULD BE REPORTED IMMEDIATELY:  *FEVER GREATER THAN 100.5 F  *CHILLS WITH OR WITHOUT FEVER  NAUSEA AND VOMITING THAT IS NOT CONTROLLED WITH YOUR NAUSEA MEDICATION  *UNUSUAL SHORTNESS OF BREATH  *UNUSUAL BRUISING OR BLEEDING  TENDERNESS IN MOUTH AND THROAT WITH OR WITHOUT PRESENCE OF ULCERS  *URINARY PROBLEMS  *BOWEL PROBLEMS  UNUSUAL RASH Items with * indicate a potential emergency and should be followed up as soon as possible.  Feel free to call the clinic should you have any questions or concerns. The clinic phone number is [phone].  Please show the CHEMO ALERT CARD at check-in to the Emergency Department and triage nurse.  Coronavirus (COVID-19) Are you at risk?  Are you at risk for the Coronavirus (COVID-19)?  To be considered HIGH RISK for Coronavirus (COVID-19), you have to meet the following criteria:  . Traveled to [place], [place], [place], [place] or [place]; or in the [place] to [place], [place], [place], or [place]; and have fever, cough, and shortness of breath within the last 2 weeks of travel OR . Been in close contact with a person diagnosed with COVID-19 within the last 2 weeks and have fever, cough, and shortness of breath . IF YOU DO NOT MEET THESE CRITERIA, YOU ARE CONSIDERED LOW RISK FOR COVID-19.  What to do if you are HIGH RISK for COVID-19?  . If you are having a medical emergency, call 911. . Seek medical care right away. Before you go to a doctor's office, urgent care or emergency department, call ahead and tell them  about your recent travel, contact with someone diagnosed with COVID-19, and your symptoms. You should receive instructions from your physician's office regarding next steps of care.  . When you arrive at healthcare provider, tell the healthcare staff immediately you have returned from visiting [place], [place], [place], [place] or [place]; or traveled in the [place] to [place], [place], [place], or [place]; in the last two weeks or you have been in close contact with a person diagnosed with COVID-19 in the last 2 weeks.   . Tell the health care staff about your symptoms: fever, cough and shortness of breath. . After you have been seen by a medical provider, you will be either: o Tested for (COVID-19) and discharged home on quarantine except to seek medical care if symptoms worsen, and asked to  - Stay home and avoid contact with others until you get your results (4-5 days)  - Avoid travel on public transportation if possible (such as bus, train, or airplane) or o Sent to the Emergency Department by EMS for evaluation, COVID-19 testing, and possible admission depending on your condition and test results.  What to do if you are LOW RISK for COVID-19?  Reduce your risk of any infection by using the same precautions used for avoiding the common cold or flu:  . Wash your hands often with soap and warm water for at least 20 seconds.  If soap and water are not readily available, use an alcohol-based hand sanitizer with at least 60% alcohol.  . If coughing or sneezing,   sneezing, cover your mouth and nose by coughing or sneezing into the elbow areas of your shirt or coat, into a tissue or into your sleeve (not your hands). . Avoid shaking hands with others and consider head nods or verbal greetings only. . Avoid touching your eyes, nose, or mouth with unwashed hands.  . Avoid close contact with people who are sick. . Avoid places or events with large numbers of people in one location, like concerts or  sporting events. . Carefully consider travel plans you have or are making. . If you are planning any travel outside or inside the [place], visit the CDC's Travelers' Health webpage for the latest health notices. . If you have some symptoms but not all symptoms, continue to monitor at home and seek medical attention if your symptoms worsen. . If you are having a medical emergency, call 911.   [place] / e-Visit: [email or web address]         [facility]: [facility]: [phone]                   [facility]: [phone]

## 2018-08-20 NOTE — Progress Notes (Signed)
[facility] OFFICE PROGRESS NOTE  Patient Care Team: [name], MD as PCP - General [name], MD as Consulting Physician (Oncology) [name], MD as Consulting Physician (Neurosurgery) [name], MD as Consulting Physician (Hematology and Oncology) [name], [name], MD as Referring Physician (Hematology and Oncology)  ASSESSMENT & PLAN:  Multiple myeloma in relapse [facility]) Her lasty PET scan showed no evidence of new bone lesion Her serum light chains and urine study confirmed slight disease progression but she is not symptomatic Currently, she does not qualify for clinical trial but she may be in the future She would like to continue treatment for now We discussed the plan for now will be to proceed with monthly daratumumab along with pomalidomide, with dose adjustment as needed to her pancytopenia She receives Zometa every 6 months, next due in July  Pancytopenia due to antineoplastic chemotherapy [facility]) She has intermittent pancytopenia due to treatment For now, she will remain on 3 mg of pomalidomide If her pancytopenia is worse, we will consider reducing the dose to 2 mg dose  Hypogammaglobulinemia, acquired She is vulnerable to infection We discussed neutropenic precaution   No orders of the defined types were placed in this encounter.   INTERVAL HISTORY: Please see below for problem oriented charting. She is seen in the infusion room while receiving treatment She denies recent infection, fever or chills No infusion reaction from daratumumab The patient denies any recent signs or symptoms of bleeding such as spontaneous epistaxis, hematuria or hematochezia.  SUMMARY OF ONCOLOGIC HISTORY: Oncology History  Multiple myeloma in relapse ([place])  07/21/2007 Bone Marrow Biopsy   Case #: [id number] Bone marrow showed myeloma   07/21/2007 Miscellaneous   She was diagnosed in May 2009. Had several cycles of RVD   10/14/2007 Bone Marrow Biopsy    Case #: [id number] repeat bone marrow biopsy showed good response to Rx   12/31/2007 Bone Marrow Transplant   She had autologous BMT at [facility]   07/19/2009 Bone Marrow Biopsy   Case #: [id number] Bone marrow biopsy showed only 1% plasma cells   10/10/2010 Bone Marrow Biopsy   [id number] Bone marrow biopsy showed 2% plasma cells   09/25/2011 Bone Marrow Biopsy   Accession #: [id number] Bone marrow only showed 4% plasma cells with ligh chain excess   10/14/2011 - 06/13/2013 Chemotherapy   She received Revlimid only, discontinued due to pancytopenia   06/18/2015 - 09/13/2015 Chemotherapy   She resumed taking Revlimid and dexamethasone. Treatment is stopped due to minimum response and pancytopenia   10/19/2015 -  Chemotherapy   The patient had Velcade for chemotherapy treatment along with weekly Daratumumab. Last dose of Velcade on 02/29/16, stopped due to progression. Pomalidomide added on 03/14/16   04/11/2016 Adverse Reaction   Delay resumption of Pomalyst cycle 2 until 04/16/16 due to neutropenia   09/16/2016 PET scan   1. No findings of active bony lymphoma. Prior activity in the sternum and thoracic spine has resolved. 2. Focal hypermetabolic activity along the sigmoid colon with some local inflammatory stranding in the adjacent mesentery, maximum SUV 11.0. This is probably from mild acute diverticulitis. There is no hypermetabolic activity in this vicinity 4 months ago to suggest that this is from colon cancer. Correlate with any symptoms. 3. Acute right maxillary sinusitis. 4. Thoracolumbar compression fractures, vertebral augmentation at the L1 level.   09/17/2016 Procedure   CT guided bone marrow biopsy of right posterior iliac bone with both aspirate and core samples obtained.  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 - TRILINEAGE HEMATOPOIESIS. - SEE  COMMENT. PERIPHERAL BLOOD: - LYMPHOPENIA. - THROMBOCYTOPENIA. Diagnosis Note The bone marrow is slightly hypocellular for age with trilineage hematopoiesis and non-specific myeloid changes likely related to previous therapy. Plasma cells are increased in number representing 8% of all cells in the aspirate, but with lack of large aggregates or sheets in the clot and biopsy sections. Immunohistochemical stains highlight the slightly increased plasma cell component in the marrow which shows kappa light chain restriction consistent with residual/recurrent plasma cell neoplasm. Correlation with cytogenetic and FISH studies recommended   09/17/2016 Pathology Results   Normal bone marrow cytogenetics and FISH   04/06/2018 PET scan   1. No abnormal focus of increased radiotracer uptake identified to suggest active disease or progression of disease. 2. Similar appearance of thoracic and lumbar compression deformities with associated kyphosis deformity. 3. Thickening of the endometrium is again noted. This is indeterminate but is considered abnormal in a postmenopausal female. Consider further evaluation with pelvic sonogram.      REVIEW OF SYSTEMS:   Constitutional: Denies fevers, chills or abnormal weight loss Eyes: Denies blurriness of vision Ears, nose, mouth, throat, and face: Denies mucositis or sore throat Respiratory: Denies cough, dyspnea or wheezes Cardiovascular: Denies palpitation, chest discomfort or lower extremity swelling Gastrointestinal:  Denies nausea, heartburn or change in bowel habits Skin: Denies abnormal skin rashes Lymphatics: Denies new lymphadenopathy or easy bruising Neurological:Denies numbness, tingling or new weaknesses Behavioral/Psych: Mood is stable, no new changes  All other systems were reviewed with the patient and are negative.  I have reviewed the past medical history, past surgical history, social history and family history with the patient and they are  unchanged from previous note.  ALLERGIES:  is allergic to hydromorphone.  MEDICATIONS:  Current Outpatient Medications  Medication Sig Dispense Refill  . albuterol (PROVENTIL HFA;VENTOLIN HFA) 108 (90 Base) MCG/ACT inhaler Inhale 2 puffs into the lungs every 4 (four) hours as needed for wheezing or shortness of breath. 1 Inhaler 2  . aspirin 81 MG tablet Take 81 mg by mouth.    . cholecalciferol (VITAMIN D) 1000 UNITS tablet Take 1,000 Units by mouth daily.    . Multiple Vitamins-Minerals (ONE-A-DAY 50 PLUS PO) Take by mouth daily.    Marland Kitchen oxycodone (OXY-IR) 5 MG capsule Take 5 mg by mouth every 4 (four) hours as needed. pain    . Oxycodone HCl (OXYCONTIN) 60 MG TB12 Take 1 tablet by mouth 2 (two) times daily.    . pantoprazole (PROTONIX) 40 MG tablet TAKE (1) TABLET DAILY AS NEEDED. 90 tablet 11  . POMALYST 3 MG capsule TAKE 1 CAPSULE BY MOUTH ONCE DAILY FOR 21 DAYS ON AND 7 DAYS OFF 21 capsule 0  . valACYclovir (VALTREX) 1000 MG tablet Take 1 tablet (1,000 mg total) by mouth daily. 90 tablet 11   No current facility-administered medications for this visit.    Facility-Administered Medications Ordered in Other Visits  Medication Dose Route Frequency Provider Last Rate Last Dose  . 0.9 %  sodium chloride infusion   Intravenous Once Alvy Bimler, Dwayna Kentner, MD      . acetaminophen (TYLENOL) tablet 650 mg  650 mg Oral Once Alvy Bimler, Nadra Hritz, MD      . daratumumab (DARZALEX) 900 mg in sodium chloride 0.9 % 455 mL chemo infusion  15.2 mg/kg (Treatment Plan Recorded) Intravenous Once [name], MD      .  dexamethasone (DECADRON) 20 mg in sodium chloride 0.9 % 50 mL IVPB  20 mg Intravenous Once Alvy Bimler, Wilford Merryfield, MD      . diphenhydrAMINE (BENADRYL) capsule 50 mg  50 mg Oral Once Alvy Bimler, Hurman Ketelsen, MD      . heparin lock flush 100 unit/mL  500 Units Intracatheter Once PRN Alvy Bimler, Earlisha Sharples, MD      . prochlorperazine (COMPAZINE) tablet 10 mg  10 mg Oral Once Nickolus Wadding, MD      . sodium chloride flush (NS) 0.9 % injection 10 mL   10 mL Intracatheter PRN Osker Ayoub, MD        PHYSICAL EXAMINATION: ECOG PERFORMANCE STATUS: 1 - Symptomatic but completely ambulatory  There were no vitals filed for this visit. There were no vitals filed for this visit.  GENERAL:alert, no distress and comfortable SKIN: skin color, texture, turgor are normal, no rashes or significant lesions EYES: normal, Conjunctiva are pink and non-injected, sclera clear OROPHARYNX:no exudate, no erythema and lips, buccal mucosa, and tongue normal  NECK: supple, thyroid normal size, non-tender, without nodularity LYMPH:  no palpable lymphadenopathy in the cervical, axillary or inguinal LUNGS: clear to auscultation and percussion with normal breathing effort HEART: regular rate & rhythm and no murmurs and no lower extremity edema ABDOMEN:abdomen soft, non-tender and normal bowel sounds Musculoskeletal:no cyanosis of digits and no clubbing  NEURO: alert & oriented x 3 with fluent speech, no focal motor/sensory deficits  LABORATORY DATA:  I have reviewed the data as listed    Component Value Date/Time   NA 140 07/23/2018 0808   NA 140 03/11/2017 1150   K 3.6 07/23/2018 0808   K 3.9 03/11/2017 1150   CL 104 07/23/2018 0808   CL 107 08/30/2012 1055   CO2 28 07/23/2018 0808   CO2 29 03/11/2017 1150   GLUCOSE 90 07/23/2018 0808   GLUCOSE 93 03/11/2017 1150   GLUCOSE 102 (H) 08/30/2012 1055   BUN 13 07/23/2018 0808   BUN 12.5 03/11/2017 1150   CREATININE 0.77 07/23/2018 0808   CREATININE 0.8 03/11/2017 1150   CALCIUM 8.5 (L) 07/23/2018 0808   CALCIUM 8.7 03/11/2017 1150   PROT 5.9 (L) 07/23/2018 0808   PROT 6.0 (L) 03/11/2017 1150   ALBUMIN 4.0 07/23/2018 0808   ALBUMIN 3.8 03/11/2017 1150   AST 21 07/23/2018 0808   AST 18 03/11/2017 1150   ALT 16 07/23/2018 0808   ALT 14 03/11/2017 1150   ALKPHOS 55 07/23/2018 0808   ALKPHOS 45 03/11/2017 1150   BILITOT 0.5 07/23/2018 0808   BILITOT 0.37 03/11/2017 1150   GFRNONAA >60 07/23/2018  0808   GFRAA >60 07/23/2018 0808    No results found for: SPEP, UPEP  Lab Results  Component Value Date   WBC 4.2 08/20/2018   NEUTROABS 2.0 08/20/2018   HGB 12.1 08/20/2018   HCT 36.7 08/20/2018   MCV 106.7 (H) 08/20/2018   PLT 115 (L) 08/20/2018      Chemistry      Component Value Date/Time   NA 140 07/23/2018 0808   NA 140 03/11/2017 1150   K 3.6 07/23/2018 0808   K 3.9 03/11/2017 1150   CL 104 07/23/2018 0808   CL 107 08/30/2012 1055   CO2 28 07/23/2018 0808   CO2 29 03/11/2017 1150   BUN 13 07/23/2018 0808   BUN 12.5 03/11/2017 1150   CREATININE 0.77 07/23/2018 0808   CREATININE 0.8 03/11/2017 1150      Component Value Date/Time   CALCIUM 8.5 (  L) 07/23/2018 0808   CALCIUM 8.7 03/11/2017 1150   ALKPHOS 55 07/23/2018 0808   ALKPHOS 45 03/11/2017 1150   AST 21 07/23/2018 0808   AST 18 03/11/2017 1150   ALT 16 07/23/2018 0808   ALT 14 03/11/2017 1150   BILITOT 0.5 07/23/2018 0808   BILITOT 0.37 03/11/2017 1150       All questions were answered. The patient knows to call the clinic with any problems, questions or concerns. No barriers to learning was detected.  I spent 15 minutes counseling the patient face to face. The total time spent in the appointment was 20 minutes and more than 50% was on counseling and review of test results  [name], MD 08/20/2018 8:44 AM

## 2018-08-20 NOTE — Assessment & Plan Note (Signed)
Her lasty PET scan showed no evidence of new bone lesion Her serum light chains and urine study confirmed slight disease progression but she is not symptomatic Currently, she does not qualify for clinical trial but she may be in the future She would like to continue treatment for now We discussed the plan for now will be to proceed with monthly daratumumab along with pomalidomide, with dose adjustment as needed to her pancytopenia She receives Zometa every 6 months, next due in July

## 2018-08-23 ENCOUNTER — Telehealth: Payer: Self-pay | Admitting: Hematology and Oncology

## 2018-08-23 LAB — MULTIPLE MYELOMA PANEL, SERUM
Albumin SerPl Elph-Mcnc: 3.7 g/dL (ref 2.9–4.4)
Albumin/Glob SerPl: 2.1 — ABNORMAL HIGH (ref 0.7–1.7)
Alpha 1: 0.2 g/dL (ref 0.0–0.4)
Alpha2 Glob SerPl Elph-Mcnc: 0.6 g/dL (ref 0.4–1.0)
B-Globulin SerPl Elph-Mcnc: 0.7 g/dL (ref 0.7–1.3)
Gamma Glob SerPl Elph-Mcnc: 0.3 g/dL — ABNORMAL LOW (ref 0.4–1.8)
Globulin, Total: 1.8 g/dL — ABNORMAL LOW (ref 2.2–3.9)
IgA: 9 mg/dL — ABNORMAL LOW (ref 87–352)
IgG (Immunoglobin G), Serum: 300 mg/dL — ABNORMAL LOW (ref 586–1602)
IgM (Immunoglobulin M), Srm: 7 mg/dL — ABNORMAL LOW (ref 26–217)
Total Protein ELP: 5.5 g/dL — ABNORMAL LOW (ref 6.0–8.5)

## 2018-08-23 LAB — KAPPA/LAMBDA LIGHT CHAINS
Kappa free light chain: 147.8 mg/L — ABNORMAL HIGH (ref 3.3–19.4)
Kappa, lambda light chain ratio: 46.19 — ABNORMAL HIGH (ref 0.26–1.65)
Lambda free light chains: 3.2 mg/L — ABNORMAL LOW (ref 5.7–26.3)

## 2018-08-23 NOTE — Telephone Encounter (Signed)
Scheduled appt per 6/12 sch message - unable to reach pt . Left message with appt date and time

## 2018-08-23 NOTE — Telephone Encounter (Signed)
I schedule what I could then sent to [place] for the rest

## 2018-08-24 ENCOUNTER — Telehealth: Payer: Self-pay | Admitting: *Deleted

## 2018-08-24 NOTE — Telephone Encounter (Signed)
Patient advise lab results as directed below. Patient confirms appts made through September.

## 2018-08-24 NOTE — Telephone Encounter (Signed)
-----   Message from [name], MD sent at 08/24/2018  9:15 AM EDT ----- Regarding: myeloma panel Pls let her know myeloma and light chains are stable Also, scheduler has been trying to reach her

## 2018-08-30 ENCOUNTER — Other Ambulatory Visit: Payer: Self-pay | Admitting: Hematology and Oncology

## 2018-08-30 NOTE — Telephone Encounter (Signed)
-

## 2018-09-16 ENCOUNTER — Other Ambulatory Visit: Payer: Self-pay | Admitting: Hematology and Oncology

## 2018-09-17 ENCOUNTER — Inpatient Hospital Stay (HOSPITAL_BASED_OUTPATIENT_CLINIC_OR_DEPARTMENT_OTHER): Payer: 59 | Admitting: Hematology and Oncology

## 2018-09-17 ENCOUNTER — Inpatient Hospital Stay: Payer: 59

## 2018-09-17 ENCOUNTER — Inpatient Hospital Stay: Payer: 59 | Attending: Hematology and Oncology

## 2018-09-17 ENCOUNTER — Encounter: Payer: Self-pay | Admitting: Hematology and Oncology

## 2018-09-17 ENCOUNTER — Other Ambulatory Visit: Payer: Self-pay

## 2018-09-17 VITALS — BP 133/92 | HR 65 | Temp 98.1°F | Resp 18

## 2018-09-17 DIAGNOSIS — Z95828 Presence of other vascular implants and grafts: Secondary | ICD-10-CM

## 2018-09-17 DIAGNOSIS — D801 Nonfamilial hypogammaglobulinemia: Secondary | ICD-10-CM | POA: Insufficient documentation

## 2018-09-17 DIAGNOSIS — Z7982 Long term (current) use of aspirin: Secondary | ICD-10-CM | POA: Diagnosis not present

## 2018-09-17 DIAGNOSIS — C9002 Multiple myeloma in relapse: Secondary | ICD-10-CM

## 2018-09-17 DIAGNOSIS — Z5112 Encounter for antineoplastic immunotherapy: Secondary | ICD-10-CM | POA: Insufficient documentation

## 2018-09-17 DIAGNOSIS — T451X5A Adverse effect of antineoplastic and immunosuppressive drugs, initial encounter: Secondary | ICD-10-CM | POA: Insufficient documentation

## 2018-09-17 DIAGNOSIS — Z79899 Other long term (current) drug therapy: Secondary | ICD-10-CM

## 2018-09-17 DIAGNOSIS — D61810 Antineoplastic chemotherapy induced pancytopenia: Secondary | ICD-10-CM | POA: Diagnosis not present

## 2018-09-17 LAB — CBC WITH DIFFERENTIAL/PLATELET
Abs Immature Granulocytes: 0.01 10*3/uL (ref 0.00–0.07)
Basophils Absolute: 0.1 10*3/uL (ref 0.0–0.1)
Basophils Relative: 2 %
Eosinophils Absolute: 0.3 10*3/uL (ref 0.0–0.5)
Eosinophils Relative: 8 %
HCT: 35.9 % — ABNORMAL LOW (ref 36.0–46.0)
Hemoglobin: 11.9 g/dL — ABNORMAL LOW (ref 12.0–15.0)
Immature Granulocytes: 0 %
Lymphocytes Relative: 24 %
Lymphs Abs: 1 10*3/uL (ref 0.7–4.0)
MCH: 35.3 pg — ABNORMAL HIGH (ref 26.0–34.0)
MCHC: 33.1 g/dL (ref 30.0–36.0)
MCV: 106.5 fL — ABNORMAL HIGH (ref 80.0–100.0)
Monocytes Absolute: 0.6 10*3/uL (ref 0.1–1.0)
Monocytes Relative: 15 %
Neutro Abs: 2.1 10*3/uL (ref 1.7–7.7)
Neutrophils Relative %: 51 %
Platelets: 109 10*3/uL — ABNORMAL LOW (ref 150–400)
RBC: 3.37 MIL/uL — ABNORMAL LOW (ref 3.87–5.11)
RDW: 13.3 % (ref 11.5–15.5)
WBC: 4.1 10*3/uL (ref 4.0–10.5)
nRBC: 0 % (ref 0.0–0.2)

## 2018-09-17 LAB — COMPREHENSIVE METABOLIC PANEL
ALT: 10 U/L (ref 0–44)
AST: 20 U/L (ref 15–41)
Albumin: 3.9 g/dL (ref 3.5–5.0)
Alkaline Phosphatase: 56 U/L (ref 38–126)
Anion gap: 9 (ref 5–15)
BUN: 14 mg/dL (ref 6–20)
CO2: 27 mmol/L (ref 22–32)
Calcium: 8.5 mg/dL — ABNORMAL LOW (ref 8.9–10.3)
Chloride: 105 mmol/L (ref 98–111)
Creatinine, Ser: 0.78 mg/dL (ref 0.44–1.00)
GFR calc Af Amer: >60 mL/min (ref >60)
GFR calc non Af Amer: >60 mL/min (ref >60)
Glucose, Bld: 90 mg/dL (ref 70–99)
Potassium: 4.2 mmol/L (ref 3.5–5.1)
Sodium: 141 mmol/L (ref 135–145)
Total Bilirubin: 0.5 mg/dL (ref 0.3–1.2)
Total Protein: 5.8 g/dL — ABNORMAL LOW (ref 6.5–8.1)

## 2018-09-17 MED ORDER — SODIUM CHLORIDE 0.9 % IV SOLN
Freq: Once | INTRAVENOUS | Status: AC
  Administered 2018-09-17: 09:00:00 via INTRAVENOUS
  Filled 2018-09-17: qty 250

## 2018-09-17 MED ORDER — DIPHENHYDRAMINE HCL 25 MG PO CAPS
50.0000 mg | ORAL_CAPSULE | Freq: Once | ORAL | Status: AC
  Administered 2018-09-17: 09:00:00 50 mg via ORAL

## 2018-09-17 MED ORDER — PROCHLORPERAZINE MALEATE 10 MG PO TABS
ORAL_TABLET | ORAL | Status: AC
  Filled 2018-09-17: qty 1

## 2018-09-17 MED ORDER — SODIUM CHLORIDE 0.9 % IV SOLN
20.0000 mg | Freq: Once | INTRAVENOUS | Status: AC
  Administered 2018-09-17: 09:00:00 20 mg via INTRAVENOUS
  Filled 2018-09-17: qty 20

## 2018-09-17 MED ORDER — HEPARIN SOD (PORK) LOCK FLUSH 100 UNIT/ML IV SOLN
500.0000 [IU] | Freq: Once | INTRAVENOUS | Status: AC | PRN
  Administered 2018-09-17: 500 [IU]
  Filled 2018-09-17: qty 5

## 2018-09-17 MED ORDER — SODIUM CHLORIDE 0.9 % IV SOLN
15.2000 mg/kg | Freq: Once | INTRAVENOUS | Status: AC
  Administered 2018-09-17: 11:00:00 900 mg via INTRAVENOUS
  Filled 2018-09-17: qty 40

## 2018-09-17 MED ORDER — DIPHENHYDRAMINE HCL 25 MG PO CAPS
ORAL_CAPSULE | ORAL | Status: AC
  Filled 2018-09-17: qty 2

## 2018-09-17 MED ORDER — ACETAMINOPHEN 325 MG PO TABS
ORAL_TABLET | ORAL | Status: AC
  Filled 2018-09-17: qty 2

## 2018-09-17 MED ORDER — PROCHLORPERAZINE MALEATE 10 MG PO TABS
10.0000 mg | ORAL_TABLET | Freq: Once | ORAL | Status: AC
  Administered 2018-09-17: 10 mg via ORAL

## 2018-09-17 MED ORDER — SODIUM CHLORIDE 0.9% FLUSH
10.0000 mL | INTRAVENOUS | Status: DC | PRN
  Administered 2018-09-17: 10 mL
  Filled 2018-09-17: qty 10

## 2018-09-17 MED ORDER — ACETAMINOPHEN 325 MG PO TABS
650.0000 mg | ORAL_TABLET | Freq: Once | ORAL | Status: AC
  Administered 2018-09-17: 650 mg via ORAL

## 2018-09-17 MED ORDER — ZOLEDRONIC ACID 4 MG/100ML IV SOLN
4.0000 mg | Freq: Once | INTRAVENOUS | Status: AC
  Administered 2018-09-17: 4 mg via INTRAVENOUS
  Filled 2018-09-17: qty 100

## 2018-09-17 NOTE — Patient Instructions (Signed)
[facility] Discharge Instructions for Patients Receiving Chemotherapy  Today you received the following chemotherapy agents Daratumumab (DARZALEX).  To help prevent nausea and vomiting after your treatment, we encourage you to take your nausea medication as prescribed.  If you develop nausea and vomiting that is not controlled by your nausea medication, call the clinic.   BELOW ARE SYMPTOMS THAT SHOULD BE REPORTED IMMEDIATELY:  *FEVER GREATER THAN 100.5 F  *CHILLS WITH OR WITHOUT FEVER  NAUSEA AND VOMITING THAT IS NOT CONTROLLED WITH YOUR NAUSEA MEDICATION  *UNUSUAL SHORTNESS OF BREATH  *UNUSUAL BRUISING OR BLEEDING  TENDERNESS IN MOUTH AND THROAT WITH OR WITHOUT PRESENCE OF ULCERS  *URINARY PROBLEMS  *BOWEL PROBLEMS  UNUSUAL RASH Items with * indicate a potential emergency and should be followed up as soon as possible.  Feel free to call the clinic should you have any questions or concerns. The clinic phone number is [phone].  Please show the CHEMO ALERT CARD at check-in to the Emergency Department and triage nurse.  Coronavirus (COVID-19) Are you at risk?  Are you at risk for the Coronavirus (COVID-19)?  To be considered HIGH RISK for Coronavirus (COVID-19), you have to meet the following criteria:  . Traveled to [place], [place], [place], [place] or [place]; or in the [place] to [place], [place], [place], or [place]; and have fever, cough, and shortness of breath within the last 2 weeks of travel OR . Been in close contact with a person diagnosed with COVID-19 within the last 2 weeks and have fever, cough, and shortness of breath . IF YOU DO NOT MEET THESE CRITERIA, YOU ARE CONSIDERED LOW RISK FOR COVID-19.  What to do if you are HIGH RISK for COVID-19?  . If you are having a medical emergency, call 911. . Seek medical care right away. Before you go to a doctor's office, urgent care or emergency department, call ahead and tell them  about your recent travel, contact with someone diagnosed with COVID-19, and your symptoms. You should receive instructions from your physician's office regarding next steps of care.  . When you arrive at healthcare provider, tell the healthcare staff immediately you have returned from visiting [place], [place], [place], [place] or [place]; or traveled in the [place] to [place], [place], [place], or [place]; in the last two weeks or you have been in close contact with a person diagnosed with COVID-19 in the last 2 weeks.   . Tell the health care staff about your symptoms: fever, cough and shortness of breath. . After you have been seen by a medical provider, you will be either: o Tested for (COVID-19) and discharged home on quarantine except to seek medical care if symptoms worsen, and asked to  - Stay home and avoid contact with others until you get your results (4-5 days)  - Avoid travel on public transportation if possible (such as bus, train, or airplane) or o Sent to the Emergency Department by EMS for evaluation, COVID-19 testing, and possible admission depending on your condition and test results.  What to do if you are LOW RISK for COVID-19?  Reduce your risk of any infection by using the same precautions used for avoiding the common cold or flu:  . Wash your hands often with soap and warm water for at least 20 seconds.  If soap and water are not readily available, use an alcohol-based hand sanitizer with at least 60% alcohol.  . If coughing or sneezing,   sneezing, cover your mouth and nose by coughing or sneezing into the elbow areas of your shirt or coat, into a tissue or into your sleeve (not your hands). . Avoid shaking hands with others and consider head nods or verbal greetings only. . Avoid touching your eyes, nose, or mouth with unwashed hands.  . Avoid close contact with people who are sick. . Avoid places or events with large numbers of people in one location, like concerts or  sporting events. . Carefully consider travel plans you have or are making. . If you are planning any travel outside or inside the [place], visit the CDC's Travelers' Health webpage for the latest health notices. . If you have some symptoms but not all symptoms, continue to monitor at home and seek medical attention if your symptoms worsen. . If you are having a medical emergency, call 911.   [place] / e-Visit: [email or web address]         [facility]: [facility]: [phone]                   [facility]: [phone]

## 2018-09-17 NOTE — Assessment & Plan Note (Signed)
She is vulnerable to infection We discussed neutropenic precaution

## 2018-09-17 NOTE — Progress Notes (Signed)
[place] OFFICE PROGRESS NOTE  Patient Care Team: [name], MD as PCP - General [name], MD as Consulting Physician (Oncology) [name], MD as Consulting Physician (Neurosurgery) [name], MD as Consulting Physician (Hematology and Oncology) [name], [name], MD as Referring Physician (Hematology and Oncology)  ASSESSMENT & PLAN:  Multiple myeloma in relapse [facility]) Her lasty PET scan showed no evidence of new bone lesion Her serum light chains and urine study confirmed slight disease progression but she is not symptomatic Currently, she does not qualify for clinical trial but she may be in the future She would like to continue treatment for now We discussed the plan for now will be to proceed with monthly daratumumab along with pomalidomide, with dose adjustment as needed to her pancytopenia She receives Zometa every 6 months, next due today She denies recent dental issues  Pancytopenia due to antineoplastic chemotherapy [facility]) She has intermittent pancytopenia due to treatment For now, she will remain on 3 mg of pomalidomide If her pancytopenia is worse, we will consider reducing the dose to 2 mg dose  Hypogammaglobulinemia, acquired She is vulnerable to infection We discussed neutropenic precaution   No orders of the defined types were placed in this encounter.   INTERVAL HISTORY: Please see below for problem oriented charting. She returns for further follow-up She feels well No recent infection, fever or chills Denies infusion reaction No new bone pain  SUMMARY OF ONCOLOGIC HISTORY: Oncology History  Multiple myeloma in relapse ([place])  07/21/2007 Bone Marrow Biopsy   Case #: [id number] Bone marrow showed myeloma   07/21/2007 Miscellaneous   She was diagnosed in May 2009. Had several cycles of RVD   10/14/2007 Bone Marrow Biopsy   Case #: [id number] repeat bone marrow biopsy showed good response to Rx   12/31/2007 Bone Marrow  Transplant   She had autologous BMT at [facility]   07/19/2009 Bone Marrow Biopsy   Case #: [id number] Bone marrow biopsy showed only 1% plasma cells   10/10/2010 Bone Marrow Biopsy   [id number] Bone marrow biopsy showed 2% plasma cells   09/25/2011 Bone Marrow Biopsy   Accession #: [id number] Bone marrow only showed 4% plasma cells with ligh chain excess   10/14/2011 - 06/13/2013 Chemotherapy   She received Revlimid only, discontinued due to pancytopenia   06/18/2015 - 09/13/2015 Chemotherapy   She resumed taking Revlimid and dexamethasone. Treatment is stopped due to minimum response and pancytopenia   10/19/2015 -  Chemotherapy   The patient had Velcade for chemotherapy treatment along with weekly Daratumumab. Last dose of Velcade on 02/29/16, stopped due to progression. Pomalidomide added on 03/14/16   04/11/2016 Adverse Reaction   Delay resumption of Pomalyst cycle 2 until 04/16/16 due to neutropenia   09/16/2016 PET scan   1. No findings of active bony lymphoma. Prior activity in the sternum and thoracic spine has resolved. 2. Focal hypermetabolic activity along the sigmoid colon with some local inflammatory stranding in the adjacent mesentery, maximum SUV 11.0. This is probably from mild acute diverticulitis. There is no hypermetabolic activity in this vicinity 4 months ago to suggest that this is from colon cancer. Correlate with any symptoms. 3. Acute right maxillary sinusitis. 4. Thoracolumbar compression fractures, vertebral augmentation at the L1 level.   09/17/2016 Procedure   CT guided bone marrow biopsy of right posterior iliac bone with both aspirate and core samples obtained.   09/17/2016 Bone Marrow Biopsy   Bone Marrow, Aspirate,Biopsy, and Clot, right  iliac - SLIGHTLY HYPOCELLULAR BONE MARROW FOR AGE WITH PLASMA CELL NEOPLASM. - TRILINEAGE HEMATOPOIESIS. - SEE COMMENT. PERIPHERAL BLOOD: - LYMPHOPENIA. - THROMBOCYTOPENIA. Diagnosis Note The bone marrow is slightly  hypocellular for age with trilineage hematopoiesis and non-specific myeloid changes likely related to previous therapy. Plasma cells are increased in number representing 8% of all cells in the aspirate, but with lack of large aggregates or sheets in the clot and biopsy sections. Immunohistochemical stains highlight the slightly increased plasma cell component in the marrow which shows kappa light chain restriction consistent with residual/recurrent plasma cell neoplasm. Correlation with cytogenetic and FISH studies recommended   09/17/2016 Pathology Results   Normal bone marrow cytogenetics and FISH   04/06/2018 PET scan   1. No abnormal focus of increased radiotracer uptake identified to suggest active disease or progression of disease. 2. Similar appearance of thoracic and lumbar compression deformities with associated kyphosis deformity. 3. Thickening of the endometrium is again noted. This is indeterminate but is considered abnormal in a postmenopausal female. Consider further evaluation with pelvic sonogram.      REVIEW OF SYSTEMS:   Constitutional: Denies fevers, chills or abnormal weight loss Eyes: Denies blurriness of vision Ears, nose, mouth, throat, and face: Denies mucositis or sore throat Respiratory: Denies cough, dyspnea or wheezes Cardiovascular: Denies palpitation, chest discomfort or lower extremity swelling Gastrointestinal:  Denies nausea, heartburn or change in bowel habits Skin: Denies abnormal skin rashes Lymphatics: Denies new lymphadenopathy or easy bruising Neurological:Denies numbness, tingling or new weaknesses Behavioral/Psych: Mood is stable, no new changes  All other systems were reviewed with the patient and are negative.  I have reviewed the past medical history, past surgical history, social history and family history with the patient and they are unchanged from previous note.  ALLERGIES:  is allergic to hydromorphone.  MEDICATIONS:  Current Outpatient  Medications  Medication Sig Dispense Refill  . albuterol (PROVENTIL HFA;VENTOLIN HFA) 108 (90 Base) MCG/ACT inhaler Inhale 2 puffs into the lungs every 4 (four) hours as needed for wheezing or shortness of breath. 1 Inhaler 2  . aspirin 81 MG tablet Take 81 mg by mouth.    . cholecalciferol (VITAMIN D) 1000 UNITS tablet Take 1,000 Units by mouth daily.    . Multiple Vitamins-Minerals (ONE-A-DAY 50 PLUS PO) Take by mouth daily.    Marland Kitchen oxycodone (OXY-IR) 5 MG capsule Take 5 mg by mouth every 4 (four) hours as needed. pain    . Oxycodone HCl (OXYCONTIN) 60 MG TB12 Take 1 tablet by mouth 2 (two) times daily.    . pantoprazole (PROTONIX) 40 MG tablet TAKE (1) TABLET DAILY AS NEEDED. 90 tablet 11  . POMALYST 3 MG capsule TAKE 1 CAPSULE BY MOUTH ONCE DAILY FOR 21 DAYS ON AND 7 DAYS OFF 21 capsule 0  . valACYclovir (VALTREX) 1000 MG tablet Take 1 tablet (1,000 mg total) by mouth daily. 90 tablet 11   No current facility-administered medications for this visit.     PHYSICAL EXAMINATION: ECOG PERFORMANCE STATUS: 1 - Symptomatic but completely ambulatory GENERAL:alert, no distress and comfortable SKIN: skin color, texture, turgor are normal, no rashes or significant lesions EYES: normal, Conjunctiva are pink and non-injected, sclera clear OROPHARYNX:no exudate, no erythema and lips, buccal mucosa, and tongue normal  NECK: supple, thyroid normal size, non-tender, without nodularity LYMPH:  no palpable lymphadenopathy in the cervical, axillary or inguinal LUNGS: clear to auscultation and percussion with normal breathing effort HEART: regular rate & rhythm and no murmurs and no lower extremity edema  ABDOMEN:abdomen soft, non-tender and normal bowel sounds Musculoskeletal:no cyanosis of digits and no clubbing  NEURO: alert & oriented x 3 with fluent speech, no focal motor/sensory deficits  LABORATORY DATA:  I have reviewed the data as listed    Component Value Date/Time   NA 141 09/17/2018 0800   NA  140 03/11/2017 1150   K 4.2 09/17/2018 0800   K 3.9 03/11/2017 1150   CL 105 09/17/2018 0800   CL 107 08/30/2012 1055   CO2 27 09/17/2018 0800   CO2 29 03/11/2017 1150   GLUCOSE 90 09/17/2018 0800   GLUCOSE 93 03/11/2017 1150   GLUCOSE 102 (H) 08/30/2012 1055   BUN 14 09/17/2018 0800   BUN 12.5 03/11/2017 1150   CREATININE 0.78 09/17/2018 0800   CREATININE 0.77 07/23/2018 0808   CREATININE 0.8 03/11/2017 1150   CALCIUM 8.5 (L) 09/17/2018 0800   CALCIUM 8.7 03/11/2017 1150   PROT 5.8 (L) 09/17/2018 0800   PROT 6.0 (L) 03/11/2017 1150   ALBUMIN 3.9 09/17/2018 0800   ALBUMIN 3.8 03/11/2017 1150   AST 20 09/17/2018 0800   AST 21 07/23/2018 0808   AST 18 03/11/2017 1150   ALT 10 09/17/2018 0800   ALT 16 07/23/2018 0808   ALT 14 03/11/2017 1150   ALKPHOS 56 09/17/2018 0800   ALKPHOS 45 03/11/2017 1150   BILITOT 0.5 09/17/2018 0800   BILITOT 0.5 07/23/2018 0808   BILITOT 0.37 03/11/2017 1150   GFRNONAA >60 09/17/2018 0800   GFRNONAA >60 07/23/2018 0808   GFRAA >60 09/17/2018 0800   GFRAA >60 07/23/2018 0808    No results found for: SPEP, UPEP  Lab Results  Component Value Date   WBC 4.1 09/17/2018   NEUTROABS 2.1 09/17/2018   HGB 11.9 (L) 09/17/2018   HCT 35.9 (L) 09/17/2018   MCV 106.5 (H) 09/17/2018   PLT 109 (L) 09/17/2018      Chemistry      Component Value Date/Time   NA 141 09/17/2018 0800   NA 140 03/11/2017 1150   K 4.2 09/17/2018 0800   K 3.9 03/11/2017 1150   CL 105 09/17/2018 0800   CL 107 08/30/2012 1055   CO2 27 09/17/2018 0800   CO2 29 03/11/2017 1150   BUN 14 09/17/2018 0800   BUN 12.5 03/11/2017 1150   CREATININE 0.78 09/17/2018 0800   CREATININE 0.77 07/23/2018 0808   CREATININE 0.8 03/11/2017 1150      Component Value Date/Time   CALCIUM 8.5 (L) 09/17/2018 0800   CALCIUM 8.7 03/11/2017 1150   ALKPHOS 56 09/17/2018 0800   ALKPHOS 45 03/11/2017 1150   AST 20 09/17/2018 0800   AST 21 07/23/2018 0808   AST 18 03/11/2017 1150   ALT 10  09/17/2018 0800   ALT 16 07/23/2018 0808   ALT 14 03/11/2017 1150   BILITOT 0.5 09/17/2018 0800   BILITOT 0.5 07/23/2018 0808   BILITOT 0.37 03/11/2017 1150       All questions were answered. The patient knows to call the clinic with any problems, questions or concerns. No barriers to learning was detected.  I spent 15 minutes counseling the patient face to face. The total time spent in the appointment was 20 minutes and more than 50% was on counseling and review of test results  [name], MD 09/17/2018 4:18 PM

## 2018-09-17 NOTE — Assessment & Plan Note (Signed)
Her lasty PET scan showed no evidence of new bone lesion Her serum light chains and urine study confirmed slight disease progression but she is not symptomatic Currently, she does not qualify for clinical trial but she may be in the future She would like to continue treatment for now We discussed the plan for now will be to proceed with monthly daratumumab along with pomalidomide, with dose adjustment as needed to her pancytopenia She receives Zometa every 6 months, next due today She denies recent dental issues

## 2018-09-17 NOTE — Assessment & Plan Note (Signed)
She has intermittent pancytopenia due to treatment For now, she will remain on 3 mg of pomalidomide If her pancytopenia is worse, we will consider reducing the dose to 2 mg dose

## 2018-09-20 LAB — KAPPA/LAMBDA LIGHT CHAINS
Kappa free light chain: 142.8 mg/L — ABNORMAL HIGH (ref 3.3–19.4)
Kappa, lambda light chain ratio: 47.6 — ABNORMAL HIGH (ref 0.26–1.65)
Lambda free light chains: 3 mg/L — ABNORMAL LOW (ref 5.7–26.3)

## 2018-09-21 ENCOUNTER — Telehealth: Payer: Self-pay | Admitting: *Deleted

## 2018-09-21 ENCOUNTER — Other Ambulatory Visit: Payer: Self-pay | Admitting: *Deleted

## 2018-09-21 LAB — MULTIPLE MYELOMA PANEL, SERUM
Albumin SerPl Elph-Mcnc: 4 g/dL (ref 2.9–4.4)
Albumin/Glob SerPl: 2.6 — ABNORMAL HIGH (ref 0.7–1.7)
Alpha 1: 0.2 g/dL (ref 0.0–0.4)
Alpha2 Glob SerPl Elph-Mcnc: 0.6 g/dL (ref 0.4–1.0)
B-Globulin SerPl Elph-Mcnc: 0.6 g/dL — ABNORMAL LOW (ref 0.7–1.3)
Gamma Glob SerPl Elph-Mcnc: 0.2 g/dL — ABNORMAL LOW (ref 0.4–1.8)
Globulin, Total: 1.6 g/dL — ABNORMAL LOW (ref 2.2–3.9)
IgA: 6 mg/dL — ABNORMAL LOW (ref 87–352)
IgG (Immunoglobin G), Serum: 294 mg/dL — ABNORMAL LOW (ref 586–1602)
IgM (Immunoglobulin M), Srm: 10 mg/dL — ABNORMAL LOW (ref 26–217)
M Protein SerPl Elph-Mcnc: 0.1 g/dL — ABNORMAL HIGH
Total Protein ELP: 5.6 g/dL — ABNORMAL LOW (ref 6.0–8.5)

## 2018-09-21 MED ORDER — POMALIDOMIDE 3 MG PO CAPS: ORAL_CAPSULE | ORAL | 0 refills | Status: DC

## 2018-09-21 NOTE — Telephone Encounter (Signed)
-----   Message from [name], MD sent at 09/21/2018  2:00 PM EDT ----- Regarding: myeloma panel is stable. let her know

## 2018-09-21 NOTE — Telephone Encounter (Signed)
Patient called and advised lab results as directed. Patient verbalized and understanding.

## 2018-09-28 ENCOUNTER — Other Ambulatory Visit: Payer: Self-pay | Admitting: Hematology and Oncology

## 2018-10-14 ENCOUNTER — Other Ambulatory Visit: Payer: Self-pay | Admitting: Hematology

## 2018-10-15 ENCOUNTER — Encounter: Payer: Self-pay | Admitting: Hematology and Oncology

## 2018-10-15 ENCOUNTER — Inpatient Hospital Stay: Payer: 59 | Attending: Hematology and Oncology

## 2018-10-15 ENCOUNTER — Other Ambulatory Visit: Payer: Self-pay

## 2018-10-15 ENCOUNTER — Inpatient Hospital Stay (HOSPITAL_BASED_OUTPATIENT_CLINIC_OR_DEPARTMENT_OTHER): Payer: 59 | Admitting: Hematology and Oncology

## 2018-10-15 ENCOUNTER — Inpatient Hospital Stay: Payer: 59

## 2018-10-15 VITALS — BP 146/76 | HR 68 | Temp 97.3°F | Resp 18

## 2018-10-15 DIAGNOSIS — Z95828 Presence of other vascular implants and grafts: Secondary | ICD-10-CM

## 2018-10-15 DIAGNOSIS — G8929 Other chronic pain: Secondary | ICD-10-CM | POA: Insufficient documentation

## 2018-10-15 DIAGNOSIS — D61810 Antineoplastic chemotherapy induced pancytopenia: Secondary | ICD-10-CM

## 2018-10-15 DIAGNOSIS — M549 Dorsalgia, unspecified: Secondary | ICD-10-CM | POA: Diagnosis not present

## 2018-10-15 DIAGNOSIS — C9002 Multiple myeloma in relapse: Secondary | ICD-10-CM

## 2018-10-15 DIAGNOSIS — Z79899 Other long term (current) drug therapy: Secondary | ICD-10-CM | POA: Insufficient documentation

## 2018-10-15 DIAGNOSIS — T451X5A Adverse effect of antineoplastic and immunosuppressive drugs, initial encounter: Secondary | ICD-10-CM

## 2018-10-15 DIAGNOSIS — Z5112 Encounter for antineoplastic immunotherapy: Secondary | ICD-10-CM | POA: Insufficient documentation

## 2018-10-15 LAB — CBC WITH DIFFERENTIAL/PLATELET
Abs Immature Granulocytes: 0.01 10*3/uL (ref 0.00–0.07)
Basophils Absolute: 0.1 10*3/uL (ref 0.0–0.1)
Basophils Relative: 3 %
Eosinophils Absolute: 0.3 10*3/uL (ref 0.0–0.5)
Eosinophils Relative: 7 %
HCT: 37.4 % (ref 36.0–46.0)
Hemoglobin: 12.3 g/dL (ref 12.0–15.0)
Immature Granulocytes: 0 %
Lymphocytes Relative: 24 %
Lymphs Abs: 1 10*3/uL (ref 0.7–4.0)
MCH: 35 pg — ABNORMAL HIGH (ref 26.0–34.0)
MCHC: 32.9 g/dL (ref 30.0–36.0)
MCV: 106.6 fL — ABNORMAL HIGH (ref 80.0–100.0)
Monocytes Absolute: 0.6 10*3/uL (ref 0.1–1.0)
Monocytes Relative: 16 %
Neutro Abs: 1.9 10*3/uL (ref 1.7–7.7)
Neutrophils Relative %: 50 %
Platelets: 123 10*3/uL — ABNORMAL LOW (ref 150–400)
RBC: 3.51 MIL/uL — ABNORMAL LOW (ref 3.87–5.11)
RDW: 13.4 % (ref 11.5–15.5)
WBC: 3.9 10*3/uL — ABNORMAL LOW (ref 4.0–10.5)
nRBC: 0 % (ref 0.0–0.2)

## 2018-10-15 LAB — COMPREHENSIVE METABOLIC PANEL
ALT: 13 U/L (ref 0–44)
AST: 19 U/L (ref 15–41)
Albumin: 4.1 g/dL (ref 3.5–5.0)
Alkaline Phosphatase: 63 U/L (ref 38–126)
Anion gap: 14 (ref 5–15)
BUN: 13 mg/dL (ref 6–20)
CO2: 24 mmol/L (ref 22–32)
Calcium: 9 mg/dL (ref 8.9–10.3)
Chloride: 104 mmol/L (ref 98–111)
Creatinine, Ser: 0.74 mg/dL (ref 0.44–1.00)
GFR calc Af Amer: >60 mL/min (ref >60)
GFR calc non Af Amer: >60 mL/min (ref >60)
Glucose, Bld: 97 mg/dL (ref 70–99)
Potassium: 4 mmol/L (ref 3.5–5.1)
Sodium: 142 mmol/L (ref 135–145)
Total Bilirubin: 0.5 mg/dL (ref 0.3–1.2)
Total Protein: 6 g/dL — ABNORMAL LOW (ref 6.5–8.1)

## 2018-10-15 MED ORDER — ACETAMINOPHEN 325 MG PO TABS
ORAL_TABLET | ORAL | Status: AC
  Filled 2018-10-15: qty 2

## 2018-10-15 MED ORDER — SODIUM CHLORIDE 0.9 % IV SOLN
20.0000 mg | Freq: Once | INTRAVENOUS | Status: AC
  Administered 2018-10-15: 09:00:00 20 mg via INTRAVENOUS
  Filled 2018-10-15: qty 2

## 2018-10-15 MED ORDER — DIPHENHYDRAMINE HCL 25 MG PO CAPS
50.0000 mg | ORAL_CAPSULE | Freq: Once | ORAL | Status: AC
  Administered 2018-10-15: 50 mg via ORAL

## 2018-10-15 MED ORDER — SODIUM CHLORIDE 0.9 % IV SOLN
15.2000 mg/kg | Freq: Once | INTRAVENOUS | Status: AC
  Administered 2018-10-15: 11:00:00 900 mg via INTRAVENOUS
  Filled 2018-10-15: qty 40

## 2018-10-15 MED ORDER — DIPHENHYDRAMINE HCL 25 MG PO CAPS
ORAL_CAPSULE | ORAL | Status: AC
  Filled 2018-10-15: qty 2

## 2018-10-15 MED ORDER — SODIUM CHLORIDE 0.9 % IV SOLN
Freq: Once | INTRAVENOUS | Status: AC
  Administered 2018-10-15: 09:00:00 via INTRAVENOUS
  Filled 2018-10-15: qty 250

## 2018-10-15 MED ORDER — PROCHLORPERAZINE MALEATE 10 MG PO TABS
10.0000 mg | ORAL_TABLET | Freq: Once | ORAL | Status: AC
  Administered 2018-10-15: 09:00:00 10 mg via ORAL

## 2018-10-15 MED ORDER — PROCHLORPERAZINE MALEATE 10 MG PO TABS
ORAL_TABLET | ORAL | Status: AC
  Filled 2018-10-15: qty 1

## 2018-10-15 MED ORDER — ACETAMINOPHEN 325 MG PO TABS
650.0000 mg | ORAL_TABLET | Freq: Once | ORAL | Status: AC
  Administered 2018-10-15: 650 mg via ORAL

## 2018-10-15 NOTE — Patient Instructions (Signed)
[facility] Discharge Instructions for Patients Receiving Chemotherapy  Today you received the following chemotherapy agents Daratumumab (DARZALEX).  To help prevent nausea and vomiting after your treatment, we encourage you to take your nausea medication as prescribed.  If you develop nausea and vomiting that is not controlled by your nausea medication, call the clinic.   BELOW ARE SYMPTOMS THAT SHOULD BE REPORTED IMMEDIATELY:  *FEVER GREATER THAN 100.5 F  *CHILLS WITH OR WITHOUT FEVER  NAUSEA AND VOMITING THAT IS NOT CONTROLLED WITH YOUR NAUSEA MEDICATION  *UNUSUAL SHORTNESS OF BREATH  *UNUSUAL BRUISING OR BLEEDING  TENDERNESS IN MOUTH AND THROAT WITH OR WITHOUT PRESENCE OF ULCERS  *URINARY PROBLEMS  *BOWEL PROBLEMS  UNUSUAL RASH Items with * indicate a potential emergency and should be followed up as soon as possible.  Feel free to call the clinic should you have any questions or concerns. The clinic phone number is [phone].  Please show the CHEMO ALERT CARD at check-in to the Emergency Department and triage nurse.  Coronavirus (COVID-19) Are you at risk?  Are you at risk for the Coronavirus (COVID-19)?  To be considered HIGH RISK for Coronavirus (COVID-19), you have to meet the following criteria:  . Traveled to [place], [place], [place], [place] or [place]; or in the [place] to [place], [place], [place], or [place]; and have fever, cough, and shortness of breath within the last 2 weeks of travel OR . Been in close contact with a person diagnosed with COVID-19 within the last 2 weeks and have fever, cough, and shortness of breath . IF YOU DO NOT MEET THESE CRITERIA, YOU ARE CONSIDERED LOW RISK FOR COVID-19.  What to do if you are HIGH RISK for COVID-19?  . If you are having a medical emergency, call 911. . Seek medical care right away. Before you go to a doctor's office, urgent care or emergency department, call ahead and tell them  about your recent travel, contact with someone diagnosed with COVID-19, and your symptoms. You should receive instructions from your physician's office regarding next steps of care.  . When you arrive at healthcare provider, tell the healthcare staff immediately you have returned from visiting [place], [place], [place], [place] or [place]; or traveled in the [place] to [place], [place], [place], or [place]; in the last two weeks or you have been in close contact with a person diagnosed with COVID-19 in the last 2 weeks.   . Tell the health care staff about your symptoms: fever, cough and shortness of breath. . After you have been seen by a medical provider, you will be either: o Tested for (COVID-19) and discharged home on quarantine except to seek medical care if symptoms worsen, and asked to  - Stay home and avoid contact with others until you get your results (4-5 days)  - Avoid travel on public transportation if possible (such as bus, train, or airplane) or o Sent to the Emergency Department by EMS for evaluation, COVID-19 testing, and possible admission depending on your condition and test results.  What to do if you are LOW RISK for COVID-19?  Reduce your risk of any infection by using the same precautions used for avoiding the common cold or flu:  . Wash your hands often with soap and warm water for at least 20 seconds.  If soap and water are not readily available, use an alcohol-based hand sanitizer with at least 60% alcohol.  . If coughing or sneezing,   sneezing, cover your mouth and nose by coughing or sneezing into the elbow areas of your shirt or coat, into a tissue or into your sleeve (not your hands). . Avoid shaking hands with others and consider head nods or verbal greetings only. . Avoid touching your eyes, nose, or mouth with unwashed hands.  . Avoid close contact with people who are sick. . Avoid places or events with large numbers of people in one location, like concerts or  sporting events. . Carefully consider travel plans you have or are making. . If you are planning any travel outside or inside the [place], visit the CDC's Travelers' Health webpage for the latest health notices. . If you have some symptoms but not all symptoms, continue to monitor at home and seek medical attention if your symptoms worsen. . If you are having a medical emergency, call 911.   [place] / e-Visit: [email or web address]         [facility]: [facility]: [phone]                   [facility]: [phone]

## 2018-10-15 NOTE — Assessment & Plan Note (Signed)
She has intermittent pancytopenia due to treatment For now, she will remain on 3 mg of pomalidomide If her pancytopenia is worse, we will consider reducing the dose to 2 mg dose

## 2018-10-15 NOTE — Assessment & Plan Note (Signed)
She has intermittent chronic back pain Her pain is currently well managed by her pain medicine specialist Observe only for now

## 2018-10-15 NOTE — Assessment & Plan Note (Signed)
Her last PET scan showed no evidence of new bone lesion Her serum light chains and urine study confirmed slight disease progression but she is not symptomatic Currently, she does not qualify for clinical trial but she may be in the future She would like to continue treatment for now We discussed the plan for now will be to proceed with monthly daratumumab along with pomalidomide, with dose adjustment as needed to her pancytopenia She receives Zometa every 6 months She denies recent dental issues

## 2018-10-15 NOTE — Progress Notes (Signed)
[place] OFFICE PROGRESS NOTE  Patient Care Team: [name], MD as PCP - General [name], MD as Consulting Physician (Oncology) [name], MD as Consulting Physician (Neurosurgery) [name], MD as Consulting Physician (Hematology and Oncology) [name], [name], MD as Referring Physician (Hematology and Oncology)  ASSESSMENT & PLAN:  Multiple myeloma in relapse [facility]) Her last PET scan showed no evidence of new bone lesion Her serum light chains and urine study confirmed slight disease progression but she is not symptomatic Currently, she does not qualify for clinical trial but she may be in the future She would like to continue treatment for now We discussed the plan for now will be to proceed with monthly daratumumab along with pomalidomide, with dose adjustment as needed to her pancytopenia She receives Zometa every 6 months She denies recent dental issues  Pancytopenia due to antineoplastic chemotherapy [facility]) She has intermittent pancytopenia due to treatment For now, she will remain on 3 mg of pomalidomide If her pancytopenia is worse, we will consider reducing the dose to 2 mg dose  Chronic back pain greater than 3 months duration She has intermittent chronic back pain Her pain is currently well managed by her pain medicine specialist Observe only for now   Orders Placed This Encounter  Procedures  . Comprehensive metabolic panel    Standing Status:   Standing    Number of Occurrences:   22    Standing Expiration Date:   10/15/2019  . CBC with Differential/Platelet    Standing Status:   Standing    Number of Occurrences:   22    Standing Expiration Date:   10/15/2019  . Kappa/lambda light chains    Standing Status:   Standing    Number of Occurrences:   11    Standing Expiration Date:   10/15/2019  . Multiple Myeloma Panel (SPEP&IFE w/QIG)    Standing Status:   Standing    Number of Occurrences:   11    Standing Expiration  Date:   10/15/2019    INTERVAL HISTORY: Please see below for problem oriented charting. She returns for further follow-up She denies recent infusion reaction No recent infection, fever or chills Denies recent bleeding complications Her chronic back pain is stable  SUMMARY OF ONCOLOGIC HISTORY: Oncology History  Multiple myeloma in relapse ([place])  07/21/2007 Bone Marrow Biopsy   Case #: [id number] Bone marrow showed myeloma   07/21/2007 Miscellaneous   She was diagnosed in May 2009. Had several cycles of RVD   10/14/2007 Bone Marrow Biopsy   Case #: [id number] repeat bone marrow biopsy showed good response to Rx   12/31/2007 Bone Marrow Transplant   She had autologous BMT at [facility]   07/19/2009 Bone Marrow Biopsy   Case #: [id number] Bone marrow biopsy showed only 1% plasma cells   10/10/2010 Bone Marrow Biopsy   [id number] Bone marrow biopsy showed 2% plasma cells   09/25/2011 Bone Marrow Biopsy   Accession #: [id number] Bone marrow only showed 4% plasma cells with ligh chain excess   10/14/2011 - 06/13/2013 Chemotherapy   She received Revlimid only, discontinued due to pancytopenia   06/18/2015 - 09/13/2015 Chemotherapy   She resumed taking Revlimid and dexamethasone. Treatment is stopped due to minimum response and pancytopenia   10/19/2015 -  Chemotherapy   The patient had Velcade for chemotherapy treatment along with weekly Daratumumab. Last dose of Velcade on 02/29/16, stopped due to progression. Pomalidomide added on 03/14/16  04/11/2016 Adverse Reaction   Delay resumption of Pomalyst cycle 2 until 04/16/16 due to neutropenia   09/16/2016 PET scan   1. No findings of active bony lymphoma. Prior activity in the sternum and thoracic spine has resolved. 2. Focal hypermetabolic activity along the sigmoid colon with some local inflammatory stranding in the adjacent mesentery, maximum SUV 11.0. This is probably from mild acute diverticulitis. There is no hypermetabolic activity in  this vicinity 4 months ago to suggest that this is from colon cancer. Correlate with any symptoms. 3. Acute right maxillary sinusitis. 4. Thoracolumbar compression fractures, vertebral augmentation at the L1 level.   09/17/2016 Procedure   CT guided bone marrow biopsy of right posterior iliac bone with both aspirate and core samples obtained.   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 - TRILINEAGE HEMATOPOIESIS. - SEE COMMENT. PERIPHERAL BLOOD: - LYMPHOPENIA. - THROMBOCYTOPENIA. Diagnosis Note The bone marrow is slightly hypocellular for age with trilineage hematopoiesis and non-specific myeloid changes likely related to previous therapy. Plasma cells are increased in number representing 8% of all cells in the aspirate, but with lack of large aggregates or sheets in the clot and biopsy sections. Immunohistochemical stains highlight the slightly increased plasma cell component in the marrow which shows kappa light chain restriction consistent with residual/recurrent plasma cell neoplasm. Correlation with cytogenetic and FISH studies recommended   09/17/2016 Pathology Results   Normal bone marrow cytogenetics and FISH   04/06/2018 PET scan   1. No abnormal focus of increased radiotracer uptake identified to suggest active disease or progression of disease. 2. Similar appearance of thoracic and lumbar compression deformities with associated kyphosis deformity. 3. Thickening of the endometrium is again noted. This is indeterminate but is considered abnormal in a postmenopausal female. Consider further evaluation with pelvic sonogram.      REVIEW OF SYSTEMS:   Constitutional: Denies fevers, chills or abnormal weight loss Eyes: Denies blurriness of vision Ears, nose, mouth, throat, and face: Denies mucositis or sore throat Respiratory: Denies cough, dyspnea or  wheezes Cardiovascular: Denies palpitation, chest discomfort or lower extremity swelling Gastrointestinal:  Denies nausea, heartburn or change in bowel habits Skin: Denies abnormal skin rashes Lymphatics: Denies new lymphadenopathy or easy bruising Neurological:Denies numbness, tingling or new weaknesses Behavioral/Psych: Mood is stable, no new changes  All other systems were reviewed with the patient and are negative.  I have reviewed the past medical history, past surgical history, social history and family history with the patient and they are unchanged from previous note.  ALLERGIES:  is allergic to hydromorphone.  MEDICATIONS:  Current Outpatient Medications  Medication Sig Dispense Refill  . albuterol (PROVENTIL HFA;VENTOLIN HFA) 108 (90 Base) MCG/ACT inhaler Inhale 2 puffs into the lungs every 4 (four) hours as needed for wheezing or shortness of breath. 1 Inhaler 2  . aspirin 81 MG tablet Take 81 mg by mouth.    . cholecalciferol (VITAMIN D) 1000 UNITS tablet Take 1,000 Units by mouth daily.    . Multiple Vitamins-Minerals (ONE-A-DAY 50 PLUS PO) Take by mouth daily.    Marland Kitchen oxycodone (OXY-IR) 5 MG capsule Take 5 mg by mouth every 4 (four) hours as needed. pain    . Oxycodone HCl (OXYCONTIN) 60 MG TB12 Take 1 tablet by mouth 2 (two) times daily.    . pantoprazole (PROTONIX) 40 MG tablet TAKE (1) TABLET DAILY AS NEEDED. 90 tablet 11  . pomalidomide (POMALYST) 3  MG capsule TAKE 1 CAPSULE BY MOUTH ONCE DAILY FOR 21 DAYS ON AND 7 DAYS OFF 21 capsule 0  . valACYclovir (VALTREX) 1000 MG tablet TAKE 1 TABLET ONCE DAILY. 30 tablet 11   No current facility-administered medications for this visit.     PHYSICAL EXAMINATION: ECOG PERFORMANCE STATUS: 1 - Symptomatic but completely ambulatory GENERAL:alert, no distress and comfortable SKIN: skin color, texture, turgor are normal, no rashes or significant lesions EYES: normal, Conjunctiva are pink and non-injected, sclera clear OROPHARYNX:no  exudate, no erythema and lips, buccal mucosa, and tongue normal  NECK: supple, thyroid normal size, non-tender, without nodularity LYMPH:  no palpable lymphadenopathy in the cervical, axillary or inguinal LUNGS: clear to auscultation and percussion with normal breathing effort HEART: regular rate & rhythm and no murmurs and no lower extremity edema ABDOMEN:abdomen soft, non-tender and normal bowel sounds Musculoskeletal:no cyanosis of digits and no clubbing  NEURO: alert & oriented x 3 with fluent speech, no focal motor/sensory deficits  LABORATORY DATA:  I have reviewed the data as listed    Component Value Date/Time   NA 142 10/15/2018 0739   NA 140 03/11/2017 1150   K 4.0 10/15/2018 0739   K 3.9 03/11/2017 1150   CL 104 10/15/2018 0739   CL 107 08/30/2012 1055   CO2 24 10/15/2018 0739   CO2 29 03/11/2017 1150   GLUCOSE 97 10/15/2018 0739   GLUCOSE 93 03/11/2017 1150   GLUCOSE 102 (H) 08/30/2012 1055   BUN 13 10/15/2018 0739   BUN 12.5 03/11/2017 1150   CREATININE 0.74 10/15/2018 0739   CREATININE 0.77 07/23/2018 0808   CREATININE 0.8 03/11/2017 1150   CALCIUM 9.0 10/15/2018 0739   CALCIUM 8.7 03/11/2017 1150   PROT 6.0 (L) 10/15/2018 0739   PROT 6.0 (L) 03/11/2017 1150   ALBUMIN 4.1 10/15/2018 0739   ALBUMIN 3.8 03/11/2017 1150   AST 19 10/15/2018 0739   AST 21 07/23/2018 0808   AST 18 03/11/2017 1150   ALT 13 10/15/2018 0739   ALT 16 07/23/2018 0808   ALT 14 03/11/2017 1150   ALKPHOS 63 10/15/2018 0739   ALKPHOS 45 03/11/2017 1150   BILITOT 0.5 10/15/2018 0739   BILITOT 0.5 07/23/2018 0808   BILITOT 0.37 03/11/2017 1150   GFRNONAA >60 10/15/2018 0739   GFRNONAA >60 07/23/2018 0808   GFRAA >60 10/15/2018 0739   GFRAA >60 07/23/2018 0808    No results found for: SPEP, UPEP  Lab Results  Component Value Date   WBC 3.9 (L) 10/15/2018   NEUTROABS 1.9 10/15/2018   HGB 12.3 10/15/2018   HCT 37.4 10/15/2018   MCV 106.6 (H) 10/15/2018   PLT 123 (L) 10/15/2018       Chemistry      Component Value Date/Time   NA 142 10/15/2018 0739   NA 140 03/11/2017 1150   K 4.0 10/15/2018 0739   K 3.9 03/11/2017 1150   CL 104 10/15/2018 0739   CL 107 08/30/2012 1055   CO2 24 10/15/2018 0739   CO2 29 03/11/2017 1150   BUN 13 10/15/2018 0739   BUN 12.5 03/11/2017 1150   CREATININE 0.74 10/15/2018 0739   CREATININE 0.77 07/23/2018 0808   CREATININE 0.8 03/11/2017 1150      Component Value Date/Time   CALCIUM 9.0 10/15/2018 0739   CALCIUM 8.7 03/11/2017 1150   ALKPHOS 63 10/15/2018 0739   ALKPHOS 45 03/11/2017 1150   AST 19 10/15/2018 0739   AST 21 07/23/2018 0808   AST 18  03/11/2017 1150   ALT 13 10/15/2018 0739   ALT 16 07/23/2018 0808   ALT 14 03/11/2017 1150   BILITOT 0.5 10/15/2018 0739   BILITOT 0.5 07/23/2018 0808   BILITOT 0.37 03/11/2017 1150     All questions were answered. The patient knows to call the clinic with any problems, questions or concerns. No barriers to learning was detected.  I spent 15 minutes counseling the patient face to face. The total time spent in the appointment was 20 minutes and more than 50% was on counseling and review of test results  [name], MD 10/15/2018 2:22 PM

## 2018-10-17 LAB — KAPPA/LAMBDA LIGHT CHAINS
Kappa free light chain: 175.3 mg/L — ABNORMAL HIGH (ref 3.3–19.4)
Kappa, lambda light chain ratio: 64.93 — ABNORMAL HIGH (ref 0.26–1.65)
Lambda free light chains: 2.7 mg/L — ABNORMAL LOW (ref 5.7–26.3)

## 2018-10-19 LAB — MULTIPLE MYELOMA PANEL, SERUM
Albumin SerPl Elph-Mcnc: 3.8 g/dL (ref 2.9–4.4)
Albumin/Glob SerPl: 2.2 — ABNORMAL HIGH (ref 0.7–1.7)
Alpha 1: 0.2 g/dL (ref 0.0–0.4)
Alpha2 Glob SerPl Elph-Mcnc: 0.6 g/dL (ref 0.4–1.0)
B-Globulin SerPl Elph-Mcnc: 0.6 g/dL — ABNORMAL LOW (ref 0.7–1.3)
Gamma Glob SerPl Elph-Mcnc: 0.3 g/dL — ABNORMAL LOW (ref 0.4–1.8)
Globulin, Total: 1.8 g/dL — ABNORMAL LOW (ref 2.2–3.9)
IgA: 8 mg/dL — ABNORMAL LOW (ref 87–352)
IgG (Immunoglobin G), Serum: 344 mg/dL — ABNORMAL LOW (ref 586–1602)
IgM (Immunoglobulin M), Srm: 7 mg/dL — ABNORMAL LOW (ref 26–217)
M Protein SerPl Elph-Mcnc: 0.1 g/dL — ABNORMAL HIGH
Total Protein ELP: 5.6 g/dL — ABNORMAL LOW (ref 6.0–8.5)

## 2018-10-20 ENCOUNTER — Telehealth: Payer: Self-pay

## 2018-10-20 NOTE — Telephone Encounter (Signed)
Spoke with pt by phone and gave below msg. No other needs at this time.

## 2018-10-20 NOTE — Telephone Encounter (Signed)
-----   Message from Belva Chimes, RN sent at 10/19/2018  3:32 PM EDT ----- Regarding: FW: myeloma panel I called an left a message- can you try to reach her tomorrow please? ----- Message ----- From: [name], MD Sent: 10/19/2018   3:13 PM EDT To: Belva Chimes, RN Subject: myeloma panel                                  Pls let her know light chains look a bit worse We will repeat again next month and I will discuss with her when I see her

## 2018-10-21 ENCOUNTER — Other Ambulatory Visit: Payer: Self-pay | Admitting: Hematology and Oncology

## 2018-10-21 NOTE — Telephone Encounter (Signed)
-

## 2018-11-12 ENCOUNTER — Inpatient Hospital Stay: Payer: 59

## 2018-11-12 ENCOUNTER — Other Ambulatory Visit: Payer: Self-pay | Admitting: Hematology and Oncology

## 2018-11-12 ENCOUNTER — Other Ambulatory Visit: Payer: Self-pay

## 2018-11-12 ENCOUNTER — Inpatient Hospital Stay (HOSPITAL_BASED_OUTPATIENT_CLINIC_OR_DEPARTMENT_OTHER): Payer: 59 | Admitting: Hematology and Oncology

## 2018-11-12 ENCOUNTER — Encounter: Payer: Self-pay | Admitting: Hematology and Oncology

## 2018-11-12 ENCOUNTER — Inpatient Hospital Stay: Payer: 59 | Attending: Hematology and Oncology

## 2018-11-12 VITALS — BP 128/73 | HR 75 | Temp 98.2°F | Resp 17

## 2018-11-12 DIAGNOSIS — C9002 Multiple myeloma in relapse: Secondary | ICD-10-CM

## 2018-11-12 DIAGNOSIS — G8929 Other chronic pain: Secondary | ICD-10-CM | POA: Diagnosis not present

## 2018-11-12 DIAGNOSIS — T451X5A Adverse effect of antineoplastic and immunosuppressive drugs, initial encounter: Secondary | ICD-10-CM | POA: Diagnosis not present

## 2018-11-12 DIAGNOSIS — M549 Dorsalgia, unspecified: Secondary | ICD-10-CM

## 2018-11-12 DIAGNOSIS — Z5112 Encounter for antineoplastic immunotherapy: Secondary | ICD-10-CM | POA: Diagnosis not present

## 2018-11-12 DIAGNOSIS — Z79899 Other long term (current) drug therapy: Secondary | ICD-10-CM | POA: Diagnosis not present

## 2018-11-12 DIAGNOSIS — D61810 Antineoplastic chemotherapy induced pancytopenia: Secondary | ICD-10-CM

## 2018-11-12 LAB — CBC WITH DIFFERENTIAL/PLATELET
Abs Immature Granulocytes: 0.01 10*3/uL (ref 0.00–0.07)
Basophils Absolute: 0.1 10*3/uL (ref 0.0–0.1)
Basophils Relative: 3 %
Eosinophils Absolute: 0.4 10*3/uL (ref 0.0–0.5)
Eosinophils Relative: 10 %
HCT: 34 % — ABNORMAL LOW (ref 36.0–46.0)
Hemoglobin: 11.6 g/dL — ABNORMAL LOW (ref 12.0–15.0)
Immature Granulocytes: 0 %
Lymphocytes Relative: 32 %
Lymphs Abs: 1.1 10*3/uL (ref 0.7–4.0)
MCH: 35.7 pg — ABNORMAL HIGH (ref 26.0–34.0)
MCHC: 34.1 g/dL (ref 30.0–36.0)
MCV: 104.6 fL — ABNORMAL HIGH (ref 80.0–100.0)
Monocytes Absolute: 0.6 10*3/uL (ref 0.1–1.0)
Monocytes Relative: 17 %
Neutro Abs: 1.3 10*3/uL — ABNORMAL LOW (ref 1.7–7.7)
Neutrophils Relative %: 38 %
Platelets: 108 10*3/uL — ABNORMAL LOW (ref 150–400)
RBC: 3.25 MIL/uL — ABNORMAL LOW (ref 3.87–5.11)
RDW: 13.2 % (ref 11.5–15.5)
WBC: 3.4 10*3/uL — ABNORMAL LOW (ref 4.0–10.5)
nRBC: 0 % (ref 0.0–0.2)

## 2018-11-12 LAB — COMPREHENSIVE METABOLIC PANEL
ALT: 15 U/L (ref 0–44)
AST: 20 U/L (ref 15–41)
Albumin: 4.1 g/dL (ref 3.5–5.0)
Alkaline Phosphatase: 56 U/L (ref 38–126)
Anion gap: 10 (ref 5–15)
BUN: 14 mg/dL (ref 6–20)
CO2: 25 mmol/L (ref 22–32)
Calcium: 8.6 mg/dL — ABNORMAL LOW (ref 8.9–10.3)
Chloride: 105 mmol/L (ref 98–111)
Creatinine, Ser: 0.72 mg/dL (ref 0.44–1.00)
GFR calc Af Amer: >60 mL/min (ref >60)
GFR calc non Af Amer: >60 mL/min (ref >60)
Glucose, Bld: 90 mg/dL (ref 70–99)
Potassium: 3.7 mmol/L (ref 3.5–5.1)
Sodium: 140 mmol/L (ref 135–145)
Total Bilirubin: 0.4 mg/dL (ref 0.3–1.2)
Total Protein: 5.9 g/dL — ABNORMAL LOW (ref 6.5–8.1)

## 2018-11-12 MED ORDER — DIPHENHYDRAMINE HCL 25 MG PO CAPS
50.0000 mg | ORAL_CAPSULE | Freq: Once | ORAL | Status: AC
  Administered 2018-11-12: 50 mg via ORAL

## 2018-11-12 MED ORDER — PROCHLORPERAZINE MALEATE 10 MG PO TABS
10.0000 mg | ORAL_TABLET | Freq: Once | ORAL | Status: AC
  Administered 2018-11-12: 09:00:00 10 mg via ORAL

## 2018-11-12 MED ORDER — SODIUM CHLORIDE 0.9 % IV SOLN
15.2000 mg/kg | Freq: Once | INTRAVENOUS | Status: AC
  Administered 2018-11-12: 900 mg via INTRAVENOUS
  Filled 2018-11-12: qty 40

## 2018-11-12 MED ORDER — ACETAMINOPHEN 325 MG PO TABS
650.0000 mg | ORAL_TABLET | Freq: Once | ORAL | Status: AC
  Administered 2018-11-12: 09:00:00 650 mg via ORAL

## 2018-11-12 MED ORDER — PROCHLORPERAZINE MALEATE 10 MG PO TABS
ORAL_TABLET | ORAL | Status: AC
  Filled 2018-11-12: qty 1

## 2018-11-12 MED ORDER — DIPHENHYDRAMINE HCL 25 MG PO CAPS
ORAL_CAPSULE | ORAL | Status: AC
  Filled 2018-11-12: qty 2

## 2018-11-12 MED ORDER — SODIUM CHLORIDE 0.9 % IV SOLN
Freq: Once | INTRAVENOUS | Status: AC
  Administered 2018-11-12: 09:00:00 via INTRAVENOUS
  Filled 2018-11-12: qty 250

## 2018-11-12 MED ORDER — HEPARIN SOD (PORK) LOCK FLUSH 100 UNIT/ML IV SOLN
500.0000 [IU] | Freq: Once | INTRAVENOUS | Status: AC | PRN
  Administered 2018-11-12: 500 [IU]
  Filled 2018-11-12: qty 5

## 2018-11-12 MED ORDER — ACETAMINOPHEN 325 MG PO TABS
ORAL_TABLET | ORAL | Status: AC
  Filled 2018-11-12: qty 2

## 2018-11-12 MED ORDER — SODIUM CHLORIDE 0.9 % IV SOLN
20.0000 mg | Freq: Once | INTRAVENOUS | Status: AC
  Administered 2018-11-12: 20 mg via INTRAVENOUS
  Filled 2018-11-12: qty 20

## 2018-11-12 MED ORDER — SODIUM CHLORIDE 0.9% FLUSH
10.0000 mL | INTRAVENOUS | Status: DC | PRN
  Administered 2018-11-12: 10 mL
  Filled 2018-11-12: qty 10

## 2018-11-12 NOTE — Assessment & Plan Note (Signed)
She has intermittent chronic back pain Her pain is currently well managed by her pain medicine specialist Observe only for now 

## 2018-11-12 NOTE — Progress Notes (Signed)
[place] OFFICE PROGRESS NOTE  Patient Care Team: [name], MD as PCP - General [name], MD as Consulting Physician (Oncology) [name], MD as Consulting Physician (Neurosurgery) [name], MD as Consulting Physician (Hematology and Oncology) [name], [name], MD as Referring Physician (Hematology and Oncology)  ASSESSMENT & PLAN:  Multiple myeloma in relapse [facility]) Her last PET scan showed no evidence of new bone lesion Her serum light chains and urine study confirmed slight disease progression but she is not symptomatic Currently, she does not qualify for clinical trial but she may be in the future; she has appointment to return to [facility] next week She would like to continue treatment for now We discussed the plan for now will be to proceed with monthly daratumumab along with pomalidomide, with dose adjustment as needed to her pancytopenia She receives Zometa every 6 months; last dose was July 2020 She denies recent dental issues  Pancytopenia due to antineoplastic chemotherapy [facility]) She has intermittent pancytopenia due to treatment For now, she will remain on 3 mg of pomalidomide If her pancytopenia is worse, we will consider reducing the dose to 2 mg dose We will proceed with daratumumab without delay  Chronic back pain greater than 3 months duration She has intermittent chronic back pain Her pain is currently well managed by her pain medicine specialist Observe only for now   No orders of the defined types were placed in this encounter.   INTERVAL HISTORY: Please see below for problem oriented charting. She returns for further follow-up She denies recent infection, fever or chills Her energy level is fair Her chronic back pain is stable with chronic pain management and she denies new pain No recent dental issues  SUMMARY OF ONCOLOGIC HISTORY: Oncology History  Multiple myeloma in relapse ([place])  07/21/2007 Bone  Marrow Biopsy   Case #: [id number] Bone marrow showed myeloma   07/21/2007 Miscellaneous   She was diagnosed in May 2009. Had several cycles of RVD   10/14/2007 Bone Marrow Biopsy   Case #: [id number] repeat bone marrow biopsy showed good response to Rx   12/31/2007 Bone Marrow Transplant   She had autologous BMT at [facility]   07/19/2009 Bone Marrow Biopsy   Case #: [id number] Bone marrow biopsy showed only 1% plasma cells   10/10/2010 Bone Marrow Biopsy   [id number] Bone marrow biopsy showed 2% plasma cells   09/25/2011 Bone Marrow Biopsy   Accession #: [id number] Bone marrow only showed 4% plasma cells with ligh chain excess   10/14/2011 - 06/13/2013 Chemotherapy   She received Revlimid only, discontinued due to pancytopenia   06/18/2015 - 09/13/2015 Chemotherapy   She resumed taking Revlimid and dexamethasone. Treatment is stopped due to minimum response and pancytopenia   10/19/2015 -  Chemotherapy   The patient had Velcade for chemotherapy treatment along with weekly Daratumumab. Last dose of Velcade on 02/29/16, stopped due to progression. Pomalidomide added on 03/14/16   04/11/2016 Adverse Reaction   Delay resumption of Pomalyst cycle 2 until 04/16/16 due to neutropenia   09/16/2016 PET scan   1. No findings of active bony lymphoma. Prior activity in the sternum and thoracic spine has resolved. 2. Focal hypermetabolic activity along the sigmoid colon with some local inflammatory stranding in the adjacent mesentery, maximum SUV 11.0. This is probably from mild acute diverticulitis. There is no hypermetabolic activity in this vicinity 4 months ago to suggest that this is from colon cancer. Correlate with any  symptoms. 3. Acute right maxillary sinusitis. 4. Thoracolumbar compression fractures, vertebral augmentation at the L1 level.   09/17/2016 Procedure   CT guided bone marrow biopsy of right posterior iliac bone with both aspirate and core samples obtained.   09/17/2016 Bone Marrow  Biopsy   Bone Marrow, Aspirate,Biopsy, and Clot, right iliac - SLIGHTLY HYPOCELLULAR BONE MARROW FOR AGE WITH PLASMA CELL NEOPLASM. - TRILINEAGE HEMATOPOIESIS. - SEE COMMENT. PERIPHERAL BLOOD: - LYMPHOPENIA. - THROMBOCYTOPENIA. Diagnosis Note The bone marrow is slightly hypocellular for age with trilineage hematopoiesis and non-specific myeloid changes likely related to previous therapy. Plasma cells are increased in number representing 8% of all cells in the aspirate, but with lack of large aggregates or sheets in the clot and biopsy sections. Immunohistochemical stains highlight the slightly increased plasma cell component in the marrow which shows kappa light chain restriction consistent with residual/recurrent plasma cell neoplasm. Correlation with cytogenetic and FISH studies recommended   09/17/2016 Pathology Results   Normal bone marrow cytogenetics and FISH   04/06/2018 PET scan   1. No abnormal focus of increased radiotracer uptake identified to suggest active disease or progression of disease. 2. Similar appearance of thoracic and lumbar compression deformities with associated kyphosis deformity. 3. Thickening of the endometrium is again noted. This is indeterminate but is considered abnormal in a postmenopausal female. Consider further evaluation with pelvic sonogram.      REVIEW OF SYSTEMS:   Constitutional: Denies fevers, chills or abnormal weight loss Eyes: Denies blurriness of vision Ears, nose, mouth, throat, and face: Denies mucositis or sore throat Respiratory: Denies cough, dyspnea or wheezes Cardiovascular: Denies palpitation, chest discomfort or lower extremity swelling Gastrointestinal:  Denies nausea, heartburn or change in bowel habits Skin: Denies abnormal skin rashes Lymphatics: Denies new lymphadenopathy or easy bruising Neurological:Denies numbness, tingling or new weaknesses Behavioral/Psych: Mood is stable, no new changes  All other systems were reviewed  with the patient and are negative.  I have reviewed the past medical history, past surgical history, social history and family history with the patient and they are unchanged from previous note.  ALLERGIES:  is allergic to hydromorphone.  MEDICATIONS:  Current Outpatient Medications  Medication Sig Dispense Refill  . albuterol (PROVENTIL HFA;VENTOLIN HFA) 108 (90 Base) MCG/ACT inhaler Inhale 2 puffs into the lungs every 4 (four) hours as needed for wheezing or shortness of breath. 1 Inhaler 2  . aspirin 81 MG tablet Take 81 mg by mouth.    . cholecalciferol (VITAMIN D) 1000 UNITS tablet Take 1,000 Units by mouth daily.    . Multiple Vitamins-Minerals (ONE-A-DAY 50 PLUS PO) Take by mouth daily.    Marland Kitchen oxycodone (OXY-IR) 5 MG capsule Take 5 mg by mouth every 4 (four) hours as needed. pain    . Oxycodone HCl (OXYCONTIN) 60 MG TB12 Take 1 tablet by mouth 2 (two) times daily.    . pantoprazole (PROTONIX) 40 MG tablet TAKE (1) TABLET DAILY AS NEEDED. 90 tablet 11  . POMALYST 3 MG capsule TAKE 1 CAPSULE BY MOUTH ONCE DAILY FOR 21 DAYS ON AND 7 DAYS OFF 21 capsule 0  . valACYclovir (VALTREX) 1000 MG tablet TAKE 1 TABLET ONCE DAILY. 30 tablet 11   No current facility-administered medications for this visit.    Facility-Administered Medications Ordered in Other Visits  Medication Dose Route Frequency Provider Last Rate Last Dose  . daratumumab (DARZALEX) 900 mg in sodium chloride 0.9 % 455 mL chemo infusion  15.2 mg/kg (Treatment Plan Recorded) Intravenous Once [name], MD      .  dexamethasone (DECADRON) 20 mg in sodium chloride 0.9 % 50 mL IVPB  20 mg Intravenous Once Hasina Kreager, MD      . heparin lock flush 100 unit/mL  500 Units Intracatheter Once PRN Alvy Bimler, Raynold Blankenbaker, MD      . sodium chloride flush (NS) 0.9 % injection 10 mL  10 mL Intracatheter PRN Janavia Rottman, MD        PHYSICAL EXAMINATION: ECOG PERFORMANCE STATUS: 1 - Symptomatic but completely ambulatory  There were no vitals filed for  this visit. There were no vitals filed for this visit.  GENERAL:alert, no distress and comfortable SKIN: skin color, texture, turgor are normal, no rashes or significant lesions EYES: normal, Conjunctiva are pink and non-injected, sclera clear OROPHARYNX:no exudate, no erythema and lips, buccal mucosa, and tongue normal  NECK: supple, thyroid normal size, non-tender, without nodularity LYMPH:  no palpable lymphadenopathy in the cervical, axillary or inguinal LUNGS: clear to auscultation and percussion with normal breathing effort HEART: regular rate & rhythm and no murmurs and no lower extremity edema ABDOMEN:abdomen soft, non-tender and normal bowel sounds Musculoskeletal:no cyanosis of digits and no clubbing  NEURO: alert & oriented x 3 with fluent speech, no focal motor/sensory deficits  LABORATORY DATA:  I have reviewed the data as listed    Component Value Date/Time   NA 142 10/15/2018 0739   NA 140 03/11/2017 1150   K 4.0 10/15/2018 0739   K 3.9 03/11/2017 1150   CL 104 10/15/2018 0739   CL 107 08/30/2012 1055   CO2 24 10/15/2018 0739   CO2 29 03/11/2017 1150   GLUCOSE 97 10/15/2018 0739   GLUCOSE 93 03/11/2017 1150   GLUCOSE 102 (H) 08/30/2012 1055   BUN 13 10/15/2018 0739   BUN 12.5 03/11/2017 1150   CREATININE 0.74 10/15/2018 0739   CREATININE 0.77 07/23/2018 0808   CREATININE 0.8 03/11/2017 1150   CALCIUM 9.0 10/15/2018 0739   CALCIUM 8.7 03/11/2017 1150   PROT 6.0 (L) 10/15/2018 0739   PROT 6.0 (L) 03/11/2017 1150   ALBUMIN 4.1 10/15/2018 0739   ALBUMIN 3.8 03/11/2017 1150   AST 19 10/15/2018 0739   AST 21 07/23/2018 0808   AST 18 03/11/2017 1150   ALT 13 10/15/2018 0739   ALT 16 07/23/2018 0808   ALT 14 03/11/2017 1150   ALKPHOS 63 10/15/2018 0739   ALKPHOS 45 03/11/2017 1150   BILITOT 0.5 10/15/2018 0739   BILITOT 0.5 07/23/2018 0808   BILITOT 0.37 03/11/2017 1150   GFRNONAA >60 10/15/2018 0739   GFRNONAA >60 07/23/2018 0808   GFRAA >60 10/15/2018  0739   GFRAA >60 07/23/2018 0808    No results found for: SPEP, UPEP  Lab Results  Component Value Date   WBC 3.4 (L) 11/12/2018   NEUTROABS 1.3 (L) 11/12/2018   HGB 11.6 (L) 11/12/2018   HCT 34.0 (L) 11/12/2018   MCV 104.6 (H) 11/12/2018   PLT 108 (L) 11/12/2018      Chemistry      Component Value Date/Time   NA 142 10/15/2018 0739   NA 140 03/11/2017 1150   K 4.0 10/15/2018 0739   K 3.9 03/11/2017 1150   CL 104 10/15/2018 0739   CL 107 08/30/2012 1055   CO2 24 10/15/2018 0739   CO2 29 03/11/2017 1150   BUN 13 10/15/2018 0739   BUN 12.5 03/11/2017 1150   CREATININE 0.74 10/15/2018 0739   CREATININE 0.77 07/23/2018 0808   CREATININE 0.8 03/11/2017 1150  Component Value Date/Time   CALCIUM 9.0 10/15/2018 0739   CALCIUM 8.7 03/11/2017 1150   ALKPHOS 63 10/15/2018 0739   ALKPHOS 45 03/11/2017 1150   AST 19 10/15/2018 0739   AST 21 07/23/2018 0808   AST 18 03/11/2017 1150   ALT 13 10/15/2018 0739   ALT 16 07/23/2018 0808   ALT 14 03/11/2017 1150   BILITOT 0.5 10/15/2018 0739   BILITOT 0.5 07/23/2018 0808   BILITOT 0.37 03/11/2017 1150       All questions were answered. The patient knows to call the clinic with any problems, questions or concerns. No barriers to learning was detected.  I spent 15 minutes counseling the patient face to face. The total time spent in the appointment was 20 minutes and more than 50% was on counseling and review of test results  [name], MD 11/12/2018 8:59 AM

## 2018-11-12 NOTE — Assessment & Plan Note (Signed)
Her last PET scan showed no evidence of new bone lesion Her serum light chains and urine study confirmed slight disease progression but she is not symptomatic Currently, she does not qualify for clinical trial but she may be in the future; she has appointment to return to [facility] next week She would like to continue treatment for now We discussed the plan for now will be to proceed with monthly daratumumab along with pomalidomide, with dose adjustment as needed to her pancytopenia She receives Zometa every 6 months; last dose was July 2020 She denies recent dental issues

## 2018-11-12 NOTE — Assessment & Plan Note (Signed)
She has intermittent pancytopenia due to treatment For now, she will remain on 3 mg of pomalidomide If her pancytopenia is worse, we will consider reducing the dose to 2 mg dose We will proceed with daratumumab without delay

## 2018-11-12 NOTE — Patient Instructions (Signed)
[facility] Discharge Instructions for Patients Receiving Chemotherapy  Today you received the following chemotherapy agents Daratumumab (DARZALEX).  To help prevent nausea and vomiting after your treatment, we encourage you to take your nausea medication as prescribed.  If you develop nausea and vomiting that is not controlled by your nausea medication, call the clinic.   BELOW ARE SYMPTOMS THAT SHOULD BE REPORTED IMMEDIATELY:  *FEVER GREATER THAN 100.5 F  *CHILLS WITH OR WITHOUT FEVER  NAUSEA AND VOMITING THAT IS NOT CONTROLLED WITH YOUR NAUSEA MEDICATION  *UNUSUAL SHORTNESS OF BREATH  *UNUSUAL BRUISING OR BLEEDING  TENDERNESS IN MOUTH AND THROAT WITH OR WITHOUT PRESENCE OF ULCERS  *URINARY PROBLEMS  *BOWEL PROBLEMS  UNUSUAL RASH Items with * indicate a potential emergency and should be followed up as soon as possible.  Feel free to call the clinic should you have any questions or concerns. The clinic phone number is [phone].  Please show the CHEMO ALERT CARD at check-in to the Emergency Department and triage nurse.  Coronavirus (COVID-19) Are you at risk?  Are you at risk for the Coronavirus (COVID-19)?  To be considered HIGH RISK for Coronavirus (COVID-19), you have to meet the following criteria:  . Traveled to [place], [place], [place], [place] or [place]; or in the [place] to [place], [place], [place], or [place]; and have fever, cough, and shortness of breath within the last 2 weeks of travel OR . Been in close contact with a person diagnosed with COVID-19 within the last 2 weeks and have fever, cough, and shortness of breath . IF YOU DO NOT MEET THESE CRITERIA, YOU ARE CONSIDERED LOW RISK FOR COVID-19.  What to do if you are HIGH RISK for COVID-19?  . If you are having a medical emergency, call 911. . Seek medical care right away. Before you go to a doctor's office, urgent care or emergency department, call ahead and tell them  about your recent travel, contact with someone diagnosed with COVID-19, and your symptoms. You should receive instructions from your physician's office regarding next steps of care.  . When you arrive at healthcare provider, tell the healthcare staff immediately you have returned from visiting [place], [place], [place], [place] or [place]; or traveled in the [place] to [place], [place], [place], or [place]; in the last two weeks or you have been in close contact with a person diagnosed with COVID-19 in the last 2 weeks.   . Tell the health care staff about your symptoms: fever, cough and shortness of breath. . After you have been seen by a medical provider, you will be either: o Tested for (COVID-19) and discharged home on quarantine except to seek medical care if symptoms worsen, and asked to  - Stay home and avoid contact with others until you get your results (4-5 days)  - Avoid travel on public transportation if possible (such as bus, train, or airplane) or o Sent to the Emergency Department by EMS for evaluation, COVID-19 testing, and possible admission depending on your condition and test results.  What to do if you are LOW RISK for COVID-19?  Reduce your risk of any infection by using the same precautions used for avoiding the common cold or flu:  . Wash your hands often with soap and warm water for at least 20 seconds.  If soap and water are not readily available, use an alcohol-based hand sanitizer with at least 60% alcohol.  . If coughing or sneezing,   sneezing, cover your mouth and nose by coughing or sneezing into the elbow areas of your shirt or coat, into a tissue or into your sleeve (not your hands). . Avoid shaking hands with others and consider head nods or verbal greetings only. . Avoid touching your eyes, nose, or mouth with unwashed hands.  . Avoid close contact with people who are sick. . Avoid places or events with large numbers of people in one location, like concerts or  sporting events. . Carefully consider travel plans you have or are making. . If you are planning any travel outside or inside the [place], visit the CDC's Travelers' Health webpage for the latest health notices. . If you have some symptoms but not all symptoms, continue to monitor at home and seek medical attention if your symptoms worsen. . If you are having a medical emergency, call 911.   [place] / e-Visit: [email or web address]         [facility]: [facility]: [phone]                   [facility]: [phone]

## 2018-11-16 LAB — KAPPA/LAMBDA LIGHT CHAINS
Kappa free light chain: 191.8 mg/L — ABNORMAL HIGH (ref 3.3–19.4)
Kappa, lambda light chain ratio: 58.12 — ABNORMAL HIGH (ref 0.26–1.65)
Lambda free light chains: 3.3 mg/L — ABNORMAL LOW (ref 5.7–26.3)

## 2018-11-16 LAB — MULTIPLE MYELOMA PANEL, SERUM
Albumin SerPl Elph-Mcnc: 4 g/dL (ref 2.9–4.4)
Albumin/Glob SerPl: 2.4 — ABNORMAL HIGH (ref 0.7–1.7)
Alpha 1: 0.2 g/dL (ref 0.0–0.4)
Alpha2 Glob SerPl Elph-Mcnc: 0.7 g/dL (ref 0.4–1.0)
B-Globulin SerPl Elph-Mcnc: 0.6 g/dL — ABNORMAL LOW (ref 0.7–1.3)
Gamma Glob SerPl Elph-Mcnc: 0.2 g/dL — ABNORMAL LOW (ref 0.4–1.8)
Globulin, Total: 1.7 g/dL — ABNORMAL LOW (ref 2.2–3.9)
IgA: 8 mg/dL — ABNORMAL LOW (ref 87–352)
IgG (Immunoglobin G), Serum: 315 mg/dL — ABNORMAL LOW (ref 586–1602)
IgM (Immunoglobulin M), Srm: 7 mg/dL — ABNORMAL LOW (ref 26–217)
M Protein SerPl Elph-Mcnc: 0.1 g/dL — ABNORMAL HIGH
Total Protein ELP: 5.7 g/dL — ABNORMAL LOW (ref 6.0–8.5)

## 2018-11-17 ENCOUNTER — Telehealth: Payer: Self-pay

## 2018-11-17 NOTE — Telephone Encounter (Signed)
Called and given below message to Jessica Cochran. He preferred that I give message to him. He verbalized understanding. She has appt later today at [facility] and will call back tomorrow.

## 2018-11-17 NOTE — Telephone Encounter (Signed)
-----   Message from [name], MD sent at 11/17/2018  8:39 AM EDT ----- Regarding: light chain results Pls let her know total light chains are still going up. Please call her and let her know and keep [place] posted after she sees her oncologist at [facility]

## 2018-11-19 ENCOUNTER — Telehealth: Payer: Self-pay

## 2018-11-19 ENCOUNTER — Telehealth: Payer: Self-pay | Admitting: Hematology and Oncology

## 2018-11-19 ENCOUNTER — Other Ambulatory Visit: Payer: Self-pay | Admitting: Hematology and Oncology

## 2018-11-19 DIAGNOSIS — C9002 Multiple myeloma in relapse: Secondary | ICD-10-CM

## 2018-11-19 DIAGNOSIS — Z5111 Encounter for antineoplastic chemotherapy: Secondary | ICD-10-CM

## 2018-11-19 NOTE — Telephone Encounter (Signed)
Called and given below message. Ask her to call the office back. Echo scheduled 10/7 at 11 am, arrive at 1045 at admitting at [facility].

## 2018-11-19 NOTE — Progress Notes (Signed)
DISCONTINUE ON PATHWAY REGIMEN - Multiple Myeloma     A cycle is every 28 days:     Daratumumab      Pomalidomide      Dexamethasone      Dexamethasone   **Always confirm dose/schedule in your pharmacy ordering system**  REASON: Disease Progression PRIOR TREATMENT: [id number]: DaraPd (Daratumumab + Pomalidomide + Dexamethasone) Until Progression or Unacceptable Toxicity TREATMENT RESPONSE: Progressive Disease (PD)  START ON PATHWAY REGIMEN - Multiple Myeloma and Other Plasma Cell Dyscrasias     A cycle is every 28 days:     Carfilzomib      Carfilzomib      Carfilzomib      Dexamethasone      Dexamethasone   **Always confirm dose/schedule in your pharmacy ordering system**  Patient Characteristics: Relapsed / Refractory, Second through Fourth Lines of Therapy R-ISS Staging: III Disease Classification: Relapsed Line of Therapy: Third Line Intent of Therapy: Non-Curative / Palliative Intent, Discussed with Patient

## 2018-11-19 NOTE — Telephone Encounter (Signed)
-----   Message from [name], MD sent at 11/19/2018  7:47 AM EDT ----- Regarding: echo, EKG, height and weight PLs schedule echo on 10/7. On that day, please ask her to come in to get an EKG, repeat height and weight. Because she was only seen in infusion room, we have not updated her height and weight for a long time

## 2018-11-19 NOTE — Telephone Encounter (Signed)
I spoke with the patient and her husband over the telephone I reviewed documentation and plan of care as outlined from her recent visit to [facility] The patient is interested to pursue single agent carfilzomib with dexamethasone We discussed risks of cardiotoxicity She will finish her current prescription of pomalidomide Due to her upcoming vacation, we will start her treatment on October 9 She will get baseline echocardiogram and EKG before her treatment

## 2018-11-22 ENCOUNTER — Telehealth: Payer: Self-pay | Admitting: Hematology and Oncology

## 2018-11-22 NOTE — Telephone Encounter (Signed)
Scheduled appt per 9/11 sch message - pt aware of appt date and time   

## 2018-11-22 NOTE — Telephone Encounter (Signed)
Patient returned call- she is scheduled on 10/7 at 9:45. Scheduling message sent for an [place] visit. Will monitor schedule for accuracy. Patient understands to come to the cancer center at 945 for the EKG and will proceed to [facility] admit for the echo.

## 2018-11-24 ENCOUNTER — Other Ambulatory Visit: Payer: Self-pay | Admitting: Hematology and Oncology

## 2018-12-15 ENCOUNTER — Other Ambulatory Visit: Payer: Self-pay

## 2018-12-15 ENCOUNTER — Ambulatory Visit (HOSPITAL_COMMUNITY)
Admission: RE | Admit: 2018-12-15 | Discharge: 2018-12-15 | Disposition: A | Discharge status: Home / Self Care | Payer: 59 | Source: Ambulatory Visit | Attending: Hematology and Oncology | Admitting: Hematology and Oncology

## 2018-12-15 ENCOUNTER — Inpatient Hospital Stay: Payer: 59 | Attending: Hematology and Oncology

## 2018-12-15 DIAGNOSIS — C9002 Multiple myeloma in relapse: Secondary | ICD-10-CM | POA: Insufficient documentation

## 2018-12-15 DIAGNOSIS — Z5111 Encounter for antineoplastic chemotherapy: Secondary | ICD-10-CM | POA: Insufficient documentation

## 2018-12-15 DIAGNOSIS — Z79899 Other long term (current) drug therapy: Secondary | ICD-10-CM | POA: Insufficient documentation

## 2018-12-15 DIAGNOSIS — G8929 Other chronic pain: Secondary | ICD-10-CM | POA: Insufficient documentation

## 2018-12-15 DIAGNOSIS — T451X5A Adverse effect of antineoplastic and immunosuppressive drugs, initial encounter: Secondary | ICD-10-CM | POA: Insufficient documentation

## 2018-12-15 DIAGNOSIS — I082 Rheumatic disorders of both aortic and tricuspid valves: Secondary | ICD-10-CM | POA: Diagnosis not present

## 2018-12-15 DIAGNOSIS — M549 Dorsalgia, unspecified: Secondary | ICD-10-CM | POA: Insufficient documentation

## 2018-12-15 DIAGNOSIS — D61810 Antineoplastic chemotherapy induced pancytopenia: Secondary | ICD-10-CM | POA: Insufficient documentation

## 2018-12-15 NOTE — Progress Notes (Signed)
  Echocardiogram 2D Echocardiogram has been performed.  Jessica Cochran 12/15/2018, 12:02 PM

## 2018-12-17 ENCOUNTER — Inpatient Hospital Stay (HOSPITAL_BASED_OUTPATIENT_CLINIC_OR_DEPARTMENT_OTHER): Payer: 59 | Admitting: Hematology and Oncology

## 2018-12-17 ENCOUNTER — Inpatient Hospital Stay: Payer: 59

## 2018-12-17 ENCOUNTER — Other Ambulatory Visit: Payer: Self-pay

## 2018-12-17 ENCOUNTER — Encounter: Payer: Self-pay | Admitting: Hematology and Oncology

## 2018-12-17 ENCOUNTER — Other Ambulatory Visit: Payer: Self-pay | Admitting: Hematology and Oncology

## 2018-12-17 VITALS — BP 140/90 | HR 84 | Temp 97.9°F | Resp 16

## 2018-12-17 DIAGNOSIS — D61810 Antineoplastic chemotherapy induced pancytopenia: Secondary | ICD-10-CM | POA: Diagnosis not present

## 2018-12-17 DIAGNOSIS — Z5111 Encounter for antineoplastic chemotherapy: Secondary | ICD-10-CM | POA: Diagnosis not present

## 2018-12-17 DIAGNOSIS — C9002 Multiple myeloma in relapse: Secondary | ICD-10-CM

## 2018-12-17 DIAGNOSIS — M549 Dorsalgia, unspecified: Secondary | ICD-10-CM

## 2018-12-17 DIAGNOSIS — Z79899 Other long term (current) drug therapy: Secondary | ICD-10-CM | POA: Diagnosis not present

## 2018-12-17 DIAGNOSIS — T451X5A Adverse effect of antineoplastic and immunosuppressive drugs, initial encounter: Secondary | ICD-10-CM

## 2018-12-17 DIAGNOSIS — G8929 Other chronic pain: Secondary | ICD-10-CM | POA: Diagnosis not present

## 2018-12-17 LAB — CBC WITH DIFFERENTIAL/PLATELET
Abs Immature Granulocytes: 0 10*3/uL (ref 0.00–0.07)
Basophils Absolute: 0.1 10*3/uL (ref 0.0–0.1)
Basophils Relative: 4 %
Eosinophils Absolute: 0.3 10*3/uL (ref 0.0–0.5)
Eosinophils Relative: 12 %
HCT: 34.9 % — ABNORMAL LOW (ref 36.0–46.0)
Hemoglobin: 11.6 g/dL — ABNORMAL LOW (ref 12.0–15.0)
Immature Granulocytes: 0 %
Lymphocytes Relative: 47 %
Lymphs Abs: 1.2 10*3/uL (ref 0.7–4.0)
MCH: 35.9 pg — ABNORMAL HIGH (ref 26.0–34.0)
MCHC: 33.2 g/dL (ref 30.0–36.0)
MCV: 108 fL — ABNORMAL HIGH (ref 80.0–100.0)
Monocytes Absolute: 0.5 10*3/uL (ref 0.1–1.0)
Monocytes Relative: 18 %
Neutro Abs: 0.5 10*3/uL — ABNORMAL LOW (ref 1.7–7.7)
Neutrophils Relative %: 19 %
Platelets: 88 10*3/uL — ABNORMAL LOW (ref 150–400)
RBC: 3.23 MIL/uL — ABNORMAL LOW (ref 3.87–5.11)
RDW: 13.5 % (ref 11.5–15.5)
WBC: 2.5 10*3/uL — ABNORMAL LOW (ref 4.0–10.5)
nRBC: 0 % (ref 0.0–0.2)

## 2018-12-17 LAB — COMPREHENSIVE METABOLIC PANEL
ALT: 12 U/L (ref 0–44)
AST: 17 U/L (ref 15–41)
Albumin: 3.8 g/dL (ref 3.5–5.0)
Alkaline Phosphatase: 41 U/L (ref 38–126)
Anion gap: 8 (ref 5–15)
BUN: 13 mg/dL (ref 6–20)
CO2: 27 mmol/L (ref 22–32)
Calcium: 8.3 mg/dL — ABNORMAL LOW (ref 8.9–10.3)
Chloride: 108 mmol/L (ref 98–111)
Creatinine, Ser: 0.71 mg/dL (ref 0.44–1.00)
GFR calc Af Amer: >60 mL/min (ref >60)
GFR calc non Af Amer: >60 mL/min (ref >60)
Glucose, Bld: 90 mg/dL (ref 70–99)
Potassium: 3.6 mmol/L (ref 3.5–5.1)
Sodium: 143 mmol/L (ref 135–145)
Total Bilirubin: 0.3 mg/dL (ref 0.3–1.2)
Total Protein: 5.5 g/dL — ABNORMAL LOW (ref 6.5–8.1)

## 2018-12-17 MED ORDER — SODIUM CHLORIDE 0.9 % IV SOLN
Freq: Once | INTRAVENOUS | Status: AC
  Administered 2018-12-17: 09:00:00 via INTRAVENOUS
  Filled 2018-12-17: qty 250

## 2018-12-17 MED ORDER — PROCHLORPERAZINE MALEATE 10 MG PO TABS: 10.0000 mg | ORAL_TABLET | Freq: Four times a day (QID) | ORAL | 1 refills | Status: DC | PRN

## 2018-12-17 MED ORDER — HEPARIN SOD (PORK) LOCK FLUSH 100 UNIT/ML IV SOLN
500.0000 [IU] | Freq: Once | INTRAVENOUS | Status: AC | PRN
  Administered 2018-12-17: 500 [IU]
  Filled 2018-12-17: qty 5

## 2018-12-17 MED ORDER — SODIUM CHLORIDE 0.9 % IV SOLN
Freq: Once | INTRAVENOUS | Status: DC
  Filled 2018-12-17: qty 250

## 2018-12-17 MED ORDER — DEXTROSE 5 % IV SOLN
20.0000 mg/m2 | Freq: Once | INTRAVENOUS | Status: AC
  Administered 2018-12-17: 30 mg via INTRAVENOUS
  Filled 2018-12-17: qty 15

## 2018-12-17 MED ORDER — SODIUM CHLORIDE 0.9 % IV SOLN
40.0000 mg | Freq: Once | INTRAVENOUS | Status: AC
  Administered 2018-12-17: 40 mg via INTRAVENOUS
  Filled 2018-12-17: qty 4

## 2018-12-17 MED ORDER — SODIUM CHLORIDE 0.9% FLUSH
10.0000 mL | INTRAVENOUS | Status: DC | PRN
  Administered 2018-12-17: 12:00:00 10 mL
  Filled 2018-12-17: qty 10

## 2018-12-17 MED ORDER — ONDANSETRON HCL 8 MG PO TABS: 8.0000 mg | ORAL_TABLET | Freq: Three times a day (TID) | ORAL | 3 refills | Status: DC | PRN

## 2018-12-17 NOTE — Assessment & Plan Note (Signed)
This is due to recent Pomalyst She is not symptomatic We will proceed with treatment today She will continue neutropenic precaution

## 2018-12-17 NOTE — Progress Notes (Signed)
Per Dr. Alvy Bimler ok for treatment today with lab results Pancytopenia.

## 2018-12-17 NOTE — Patient Instructions (Addendum)
[facility] Discharge Instructions for Patients Receiving Chemotherapy  Today you received the following chemotherapy agents Carfilzomib  To help prevent nausea and vomiting after your treatment, we encourage you to take your nausea medication as directed by your MD.   If you develop nausea and vomiting that is not controlled by your nausea medication, call the clinic.   BELOW ARE SYMPTOMS THAT SHOULD BE REPORTED IMMEDIATELY:  *FEVER GREATER THAN 100.5 F  *CHILLS WITH OR WITHOUT FEVER  NAUSEA AND VOMITING THAT IS NOT CONTROLLED WITH YOUR NAUSEA MEDICATION  *UNUSUAL SHORTNESS OF BREATH  *UNUSUAL BRUISING OR BLEEDING  TENDERNESS IN MOUTH AND THROAT WITH OR WITHOUT PRESENCE OF ULCERS  *URINARY PROBLEMS  *BOWEL PROBLEMS  UNUSUAL RASH Items with * indicate a potential emergency and should be followed up as soon as possible.  Feel free to call the clinic should you have any questions or concerns. The clinic phone number is [phone].  Please show the CHEMO ALERT CARD at check-in to the Emergency Department and triage nurse. Coronavirus (COVID-19) Are you at risk?  Carfilzomib injection What is this medicine? CARFILZOMIB (kar FILZ oh mib) targets a specific protein within cancer cells and stops the cancer cells from growing. It is used to treat multiple myeloma. This medicine may be used for other purposes; ask your health care provider or pharmacist if you have questions. COMMON BRAND NAME(S): KYPROLIS What should I tell my health care provider before I take this medicine? They need to know if you have any of these conditions:  heart disease  history of blood clots  irregular heartbeat  kidney disease  liver disease  lung or breathing disease  an unusual or allergic reaction to carfilzomib, or other medicines, foods, dyes, or preservatives  pregnant or trying to get pregnant  breast-feeding How should I use this medicine? This medicine is for  injection or infusion into a vein. It is given by a health care professional in a hospital or clinic setting. Talk to your pediatrician regarding the use of this medicine in children. Special care may be needed. Overdosage: If you think you have taken too much of this medicine contact a poison control center or emergency room at once. NOTE: This medicine is only for you. Do not share this medicine with others. What if I miss a dose? It is important not to miss your dose. Call your doctor or health care professional if you are unable to keep an appointment. What may interact with this medicine? Interactions are not expected. Give your health care provider a list of all the medicines, herbs, non-prescription drugs, or dietary supplements you use. Also tell them if you smoke, drink alcohol, or use illegal drugs. Some items may interact with your medicine. This list may not describe all possible interactions. Give your health care provider a list of all the medicines, herbs, non-prescription drugs, or dietary supplements you use. Also tell them if you smoke, drink alcohol, or use illegal drugs. Some items may interact with your medicine. What should I watch for while using this medicine? Your condition will be monitored carefully while you are receiving this medicine. Report any side effects. Continue your course of treatment even though you feel ill unless your doctor tells you to stop. You may need blood work done while you are taking this medicine. Do not become pregnant while taking this medicine or for at least 6 months after stopping it. Women should inform their doctor if they wish to become pregnant   or think they might be pregnant. There is a potential for serious side effects to an unborn child. Men should not father a child while taking this medicine and for at least 3 months after stopping it. Talk to your health care professional or pharmacist for more information. Do not breast-feed an infant  while taking this medicine or for 2 weeks after the last dose. Check with your doctor or health care professional if you get an attack of severe diarrhea, nausea and vomiting, or if you sweat a lot. The loss of too much body fluid can make it dangerous for you to take this medicine. You may get dizzy. Do not drive, use machinery, or do anything that needs mental alertness until you know how this medicine affects you. Do not stand or sit up quickly, especially if you are an older patient. This reduces the risk of dizzy or fainting spells. What side effects may I notice from receiving this medicine? Side effects that you should report to your doctor or health care professional as soon as possible:  allergic reactions like skin rash, itching or hives, swelling of the face, lips, or tongue  confusion  dizziness  feeling faint or lightheaded  fever or chills  palpitations  seizures  signs and symptoms of bleeding such as bloody or black, tarry stools; red or dark-brown urine; spitting up blood or brown material that looks like coffee grounds; red spots on the skin; unusual bruising or bleeding including from the eye, gums, or nose  signs and symptoms of a blood clot such as breathing problems; changes in vision; chest pain; severe, sudden headache; pain, swelling, warmth in the leg; trouble speaking; sudden numbness or weakness of the face, arm or leg  signs and symptoms of kidney injury like trouble passing urine or change in the amount of urine  signs and symptoms of liver injury like dark yellow or brown urine; general ill feeling or flu-like symptoms; light-colored stools; loss of appetite; nausea; right upper belly pain; unusually weak or tired; yellowing of the eyes or skin Side effects that usually do not require medical attention (report to your doctor or health care professional if they continue or are bothersome):  back pain  cough  diarrhea  headache  muscle  cramps  vomiting This list may not describe all possible side effects. Call your doctor for medical advice about side effects. You may report side effects to FDA at 1-800-FDA-1088. Where should I keep my medicine? This drug is given in a hospital or clinic and will not be stored at home. NOTE: This sheet is a summary. It may not cover all possible information. If you have questions about this medicine, talk to your doctor, pharmacist, or health care provider.  2020 Elsevier/Gold Standard (2016-12-10 14:07:13)   Are you at risk for the Coronavirus (COVID-19)?  To be considered HIGH RISK for Coronavirus (COVID-19), you have to meet the following criteria:  . Traveled to [place], [place], [place], [place] or [place]; or in the [place] to [place], [place], [place], or [place]; and have fever, cough, and shortness of breath within the last 2 weeks of travel OR . Been in close contact with a person diagnosed with COVID-19 within the last 2 weeks and have fever, cough, and shortness of breath . IF YOU DO NOT MEET THESE CRITERIA, YOU ARE CONSIDERED LOW RISK FOR COVID-19.  What to do if you are HIGH RISK for COVID-19?  . If you are having a   medical emergency, call 911. . Seek medical care right away. Before you go to a doctor's office, urgent care or emergency department, call ahead and tell them about your recent travel, contact with someone diagnosed with COVID-19, and your symptoms. You should receive instructions from your physician's office regarding next steps of care.  . When you arrive at healthcare provider, tell the healthcare staff immediately you have returned from visiting [place], [place], [place], [place] or [place]; or traveled in the [place] to [place], [place], [place], or [place]; in the last two weeks or you have been in close contact with a person diagnosed with COVID-19 in the last 2 weeks.   . Tell the health care staff about your symptoms: fever, cough  and shortness of breath. . After you have been seen by a medical provider, you will be either: o Tested for (COVID-19) and discharged home on quarantine except to seek medical care if symptoms worsen, and asked to  - Stay home and avoid contact with others until you get your results (4-5 days)  - Avoid travel on public transportation if possible (such as bus, train, or airplane) or o Sent to the Emergency Department by EMS for evaluation, COVID-19 testing, and possible admission depending on your condition and test results.  What to do if you are LOW RISK for COVID-19?  Reduce your risk of any infection by using the same precautions used for avoiding the common cold or flu:  [place] Kitchen Wash your hands often with soap and warm water for at least 20 seconds.  If soap and water are not readily available, use an alcohol-based hand sanitizer with at least 60% alcohol.  . If coughing or sneezing, cover your mouth and nose by coughing or sneezing into the elbow areas of your shirt or coat, into a tissue or into your sleeve (not your hands). . Avoid shaking hands with others and consider head nods or verbal greetings only. . Avoid touching your eyes, nose, or mouth with unwashed hands.  . Avoid close contact with people who are sick. . Avoid places or events with large numbers of people in one location, like concerts or sporting events. . Carefully consider travel plans you have or are making. . If you are planning any travel outside or inside the [place], visit the CDC's Travelers' Health webpage for the latest health notices. . If you have some symptoms but not all symptoms, continue to monitor at home and seek medical attention if your symptoms worsen. . If you are having a medical emergency, call 911.   [place] / e-Visit: [email or web address]         [facility]: [facility]:  [phone]                   [facility]: [phone]

## 2018-12-17 NOTE — Assessment & Plan Note (Signed)
She has intermittent chronic back pain Her pain is currently well managed by her pain medicine specialist Observe only for now 

## 2018-12-17 NOTE — Progress Notes (Signed)
[facility] OFFICE PROGRESS NOTE  Patient Care Team: [name], MD as PCP - General [name], MD as Consulting Physician (Oncology) [name], MD as Consulting Physician (Neurosurgery) [name], MD as Consulting Physician (Hematology and Oncology) [name], [name], MD as Referring Physician (Hematology and Oncology)  ASSESSMENT & PLAN:  Multiple myeloma in relapse [facility]) I have reviewed blood work, EKG and echocardiogram result with the patient We discussed the risk, benefits, side effects of Kyprolis and she agreed to proceed She has stopped taking Pomalyst last week Her mild pancytopenia is likely due to this We will proceed with treatment without dose adjustment  Pancytopenia due to antineoplastic chemotherapy [facility]) This is due to recent Pomalyst She is not symptomatic We will proceed with treatment today She will continue neutropenic precaution  Chronic back pain greater than 3 months duration She has intermittent chronic back pain Her pain is currently well managed by her pain medicine specialist Observe only for now   No orders of the defined types were placed in this encounter.   INTERVAL HISTORY: Please see below for problem oriented charting. She is seen in the infusion room to begin new treatment She completed her last dose of Pomalyst last week She denies recent infection, fever or chills No worsening back pain The patient denies any recent signs or symptoms of bleeding such as spontaneous epistaxis, hematuria or hematochezia.   SUMMARY OF ONCOLOGIC HISTORY: Oncology History  Multiple myeloma in relapse ([place])  07/21/2007 Bone Marrow Biopsy   Case #: [id number] Bone marrow showed myeloma   07/21/2007 Miscellaneous   She was diagnosed in May 2009. 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 Treatment is stopped due to minimum response and pancytopenia   10/19/2015 -  Chemotherapy   The patient had Velcade for chemotherapy treatment along with weekly Daratumumab. Last dose of Velcade on 02/29/16, stopped due to progression. Pomalidomide added on 03/14/16   04/11/2016 Adverse Reaction   Delay resumption of Pomalyst cycle 2 until 04/16/16 due to neutropenia   09/16/2016 PET scan   1. No findings of active bony lymphoma. Prior activity in the sternum and thoracic spine has resolved. 2. Focal hypermetabolic activity along the sigmoid colon with some local inflammatory stranding in the adjacent mesentery, maximum SUV 11.0. This is probably from mild acute diverticulitis. There is no hypermetabolic activity in this vicinity 4 months ago to suggest that this is from colon cancer. Correlate with any symptoms. 3. Acute right maxillary sinusitis. 4. Thoracolumbar compression fractures, vertebral augmentation at the L1 level.   09/17/2016 Procedure   CT guided bone marrow biopsy of right posterior iliac bone with both aspirate and core samples obtained.   09/17/2016 Bone Marrow Biopsy   Bone Marrow, Aspirate,Biopsy, and Clot, right iliac - SLIGHTLY HYPOCELLULAR  BONE MARROW FOR AGE WITH PLASMA CELL NEOPLASM. - TRILINEAGE HEMATOPOIESIS. - SEE COMMENT. PERIPHERAL BLOOD: - LYMPHOPENIA. - THROMBOCYTOPENIA. Diagnosis Note The bone marrow is slightly hypocellular for age  with trilineage hematopoiesis and non-specific myeloid changes likely related to previous therapy. Plasma cells are increased in number representing 8% of all cells in the aspirate, but with lack of large aggregates or sheets in the clot and biopsy sections. Immunohistochemical stains highlight the slightly increased plasma cell component in the marrow which shows kappa light chain restriction consistent with residual/recurrent plasma cell neoplasm. Correlation with cytogenetic and FISH studies recommended   09/17/2016 Pathology Results   Normal bone marrow cytogenetics and FISH   04/06/2018 PET scan   1. No abnormal focus of increased radiotracer uptake identified to suggest active disease or progression of disease. 2. Similar appearance of thoracic and lumbar compression deformities with associated kyphosis deformity. 3. Thickening of the endometrium is again noted. This is indeterminate but is considered abnormal in a postmenopausal female. Consider further evaluation with pelvic sonogram.    12/15/2018 Echocardiogram   IMPRESSIONS      1. Left ventricular ejection fraction, by visual estimation, is 55 to 60%. The left ventricle has normal function. Normal left ventricular size. There is no left ventricular hypertrophy.  2. Global right ventricle has normal systolic function.The right ventricular size is mildly enlarged. No increase in right ventricular wall thickness.  3. Left atrial size was normal.  4. Right atrial size was normal.  5. The mitral valve is normal in structure. No evidence of mitral valve regurgitation. No evidence of mitral stenosis.  6. The tricuspid valve is normal in structure. Tricuspid valve regurgitation is mild.  7. The aortic valve is normal in structure. Aortic valve regurgitation was not visualized by color flow Doppler. Structurally normal aortic valve, with no evidence of sclerosis or stenosis.  8. The pulmonic valve was normal in structure. Pulmonic valve  regurgitation is not visualized by color flow Doppler.  9. The inferior vena cava is normal in size with greater than 50% respiratory variability, suggesting right atrial pressure of 3 mmHg. 10. The average left ventricular global longitudinal strain is 19.2 %.       REVIEW OF SYSTEMS:   Constitutional: Denies fevers, chills or abnormal weight loss Eyes: Denies blurriness of vision Ears, nose, mouth, throat, and face: Denies mucositis or sore throat Respiratory: Denies cough, dyspnea or wheezes Cardiovascular: Denies palpitation, chest discomfort or lower extremity swelling Gastrointestinal:  Denies nausea, heartburn or change in bowel habits Skin: Denies abnormal skin rashes Lymphatics: Denies new lymphadenopathy or easy bruising Neurological:Denies numbness, tingling or new weaknesses Behavioral/Psych: Mood is stable, no new changes  All other systems were reviewed with the patient and are negative.  I have reviewed the past medical history, past surgical history, social history and family history with the patient and they are unchanged from previous note.  ALLERGIES:  is allergic to hydromorphone.  MEDICATIONS:  Current Outpatient Medications  Medication Sig Dispense Refill  . albuterol (PROVENTIL HFA;VENTOLIN HFA) 108 (90 Base) MCG/ACT inhaler Inhale 2 puffs into the lungs every 4 (four) hours as needed for wheezing or shortness of breath. 1 Inhaler 2  . aspirin 81 MG tablet Take 81 mg by mouth.    . cholecalciferol (VITAMIN D) 1000 UNITS tablet Take 1,000 Units by mouth daily.    . Multiple Vitamins-Minerals (ONE-A-DAY 50 PLUS PO) Take by mouth daily.    Marland Kitchen oxycodone (OXY-IR) 5 MG capsule Take 5 mg by mouth  every 4 (four) hours as needed. pain    . Oxycodone HCl (OXYCONTIN) 60 MG TB12 Take 1 tablet by mouth 2 (two) times daily.    . pantoprazole (PROTONIX) 40 MG tablet TAKE (1) TABLET DAILY AS NEEDED. 90 tablet 11  . valACYclovir (VALTREX) 1000 MG tablet TAKE 1 TABLET ONCE  DAILY. 30 tablet 11   No current facility-administered medications for this visit.    Facility-Administered Medications Ordered in Other Visits  Medication Dose Route Frequency Provider Last Rate Last Dose  . 0.9 %  sodium chloride infusion   Intravenous Once Alvy Bimler, Malajah Oceguera, MD      . carfilzomib (KYPROLIS) 30 mg in dextrose 5 % 50 mL chemo infusion  20 mg/m2 (Order-Specific) Intravenous Once Alvy Bimler, Jiana Lemaire, MD      . dexamethasone (DECADRON) 40 mg in sodium chloride 0.9 % 50 mL IVPB  40 mg Intravenous Once Alvy Bimler, Halie Gass, MD      . heparin lock flush 100 unit/mL  500 Units Intracatheter Once PRN Alvy Bimler, Esley Brooking, MD      . sodium chloride flush (NS) 0.9 % injection 10 mL  10 mL Intracatheter PRN Alvy Bimler, Verona Hartshorn, MD        PHYSICAL EXAMINATION: ECOG PERFORMANCE STATUS: 1 - Symptomatic but completely ambulatory GENERAL:alert, no distress and comfortable SKIN: skin color, texture, turgor are normal, no rashes or significant lesions EYES: normal, Conjunctiva are pink and non-injected, sclera clear OROPHARYNX:no exudate, no erythema and lips, buccal mucosa, and tongue normal  NECK: supple, thyroid normal size, non-tender, without nodularity LYMPH:  no palpable lymphadenopathy in the cervical, axillary or inguinal LUNGS: clear to auscultation and percussion with normal breathing effort HEART: regular rate & rhythm and no murmurs and no lower extremity edema ABDOMEN:abdomen soft, non-tender and normal bowel sounds Musculoskeletal:no cyanosis of digits and no clubbing  NEURO: alert & oriented x 3 with fluent speech, no focal motor/sensory deficits  LABORATORY DATA:  I have reviewed the data as listed    Component Value Date/Time   NA 143 12/17/2018 0903   NA 140 03/11/2017 1150   K 3.6 12/17/2018 0903   K 3.9 03/11/2017 1150   CL 108 12/17/2018 0903   CL 107 08/30/2012 1055   CO2 27 12/17/2018 0903   CO2 29 03/11/2017 1150   GLUCOSE 90 12/17/2018 0903   GLUCOSE 93 03/11/2017 1150   GLUCOSE 102 (H)  08/30/2012 1055   BUN 13 12/17/2018 0903   BUN 12.5 03/11/2017 1150   CREATININE 0.71 12/17/2018 0903   CREATININE 0.77 07/23/2018 0808   CREATININE 0.8 03/11/2017 1150   CALCIUM 8.3 (L) 12/17/2018 0903   CALCIUM 8.7 03/11/2017 1150   PROT 5.5 (L) 12/17/2018 0903   PROT 6.0 (L) 03/11/2017 1150   ALBUMIN 3.8 12/17/2018 0903   ALBUMIN 3.8 03/11/2017 1150   AST 17 12/17/2018 0903   AST 21 07/23/2018 0808   AST 18 03/11/2017 1150   ALT 12 12/17/2018 0903   ALT 16 07/23/2018 0808   ALT 14 03/11/2017 1150   ALKPHOS 41 12/17/2018 0903   ALKPHOS 45 03/11/2017 1150   BILITOT 0.3 12/17/2018 0903   BILITOT 0.5 07/23/2018 0808   BILITOT 0.37 03/11/2017 1150   GFRNONAA >60 12/17/2018 0903   GFRNONAA >60 07/23/2018 0808   GFRAA >60 12/17/2018 0903   GFRAA >60 07/23/2018 0808    No results found for: SPEP, UPEP  Lab Results  Component Value Date   WBC 2.5 (L) 12/17/2018   NEUTROABS 0.5 (L) 12/17/2018   HGB  11.6 (L) 12/17/2018   HCT 34.9 (L) 12/17/2018   MCV 108.0 (H) 12/17/2018   PLT 88 (L) 12/17/2018      Chemistry      Component Value Date/Time   NA 143 12/17/2018 0903   NA 140 03/11/2017 1150   K 3.6 12/17/2018 0903   K 3.9 03/11/2017 1150   CL 108 12/17/2018 0903   CL 107 08/30/2012 1055   CO2 27 12/17/2018 0903   CO2 29 03/11/2017 1150   BUN 13 12/17/2018 0903   BUN 12.5 03/11/2017 1150   CREATININE 0.71 12/17/2018 0903   CREATININE 0.77 07/23/2018 0808   CREATININE 0.8 03/11/2017 1150      Component Value Date/Time   CALCIUM 8.3 (L) 12/17/2018 0903   CALCIUM 8.7 03/11/2017 1150   ALKPHOS 41 12/17/2018 0903   ALKPHOS 45 03/11/2017 1150   AST 17 12/17/2018 0903   AST 21 07/23/2018 0808   AST 18 03/11/2017 1150   ALT 12 12/17/2018 0903   ALT 16 07/23/2018 0808   ALT 14 03/11/2017 1150   BILITOT 0.3 12/17/2018 0903   BILITOT 0.5 07/23/2018 0808   BILITOT 0.37 03/11/2017 1150       All questions were answered. The patient knows to call the clinic with any  problems, questions or concerns. No barriers to learning was detected.  I spent 15 minutes counseling the patient face to face. The total time spent in the appointment was 20 minutes and more than 50% was on counseling and review of test results  [name], MD 12/17/2018 9:44 AM

## 2018-12-17 NOTE — Assessment & Plan Note (Signed)
I have reviewed blood work, EKG and echocardiogram result with the patient We discussed the risk, benefits, side effects of Kyprolis and she agreed to proceed She has stopped taking Pomalyst last week Her mild pancytopenia is likely due to this We will proceed with treatment without dose adjustment

## 2018-12-20 ENCOUNTER — Telehealth: Payer: Self-pay | Admitting: *Deleted

## 2018-12-20 LAB — KAPPA/LAMBDA LIGHT CHAINS
Kappa free light chain: 263.6 mg/L — ABNORMAL HIGH (ref 3.3–19.4)
Kappa, lambda light chain ratio: 85.03 — ABNORMAL HIGH (ref 0.26–1.65)
Lambda free light chains: 3.1 mg/L — ABNORMAL LOW (ref 5.7–26.3)

## 2018-12-20 NOTE — Telephone Encounter (Signed)
-----   Message from Lester McIntosh, RN sent at 12/17/2018 12:47 PM EDT ----- Regarding: Dr. Alvy Bimler Pt. Received first dose of Carfilzomib (Kyprolis) and tolerated well.

## 2018-12-20 NOTE — Telephone Encounter (Signed)
Called pt to see how she did with her treatment on Friday.  Left message on home & mobile # to call back.

## 2018-12-21 LAB — MULTIPLE MYELOMA PANEL, SERUM
Albumin SerPl Elph-Mcnc: 3.8 g/dL (ref 2.9–4.4)
Albumin/Glob SerPl: 2.4 — ABNORMAL HIGH (ref 0.7–1.7)
Alpha 1: 0.2 g/dL (ref 0.0–0.4)
Alpha2 Glob SerPl Elph-Mcnc: 0.5 g/dL (ref 0.4–1.0)
B-Globulin SerPl Elph-Mcnc: 0.6 g/dL — ABNORMAL LOW (ref 0.7–1.3)
Gamma Glob SerPl Elph-Mcnc: 0.2 g/dL — ABNORMAL LOW (ref 0.4–1.8)
Globulin, Total: 1.6 g/dL — ABNORMAL LOW (ref 2.2–3.9)
IgA: 7 mg/dL — ABNORMAL LOW (ref 87–352)
IgG (Immunoglobin G), Serum: 311 mg/dL — ABNORMAL LOW (ref 586–1602)
IgM (Immunoglobulin M), Srm: 7 mg/dL — ABNORMAL LOW (ref 26–217)
Total Protein ELP: 5.4 g/dL — ABNORMAL LOW (ref 6.0–8.5)

## 2018-12-24 ENCOUNTER — Other Ambulatory Visit: Payer: Self-pay

## 2018-12-24 ENCOUNTER — Inpatient Hospital Stay: Payer: 59

## 2018-12-24 ENCOUNTER — Other Ambulatory Visit: Payer: Self-pay | Admitting: Hematology and Oncology

## 2018-12-24 VITALS — BP 137/81 | HR 96 | Temp 97.8°F | Resp 18

## 2018-12-24 DIAGNOSIS — C9002 Multiple myeloma in relapse: Secondary | ICD-10-CM

## 2018-12-24 LAB — COMPREHENSIVE METABOLIC PANEL
ALT: 11 U/L (ref 0–44)
AST: 19 U/L (ref 15–41)
Albumin: 3.7 g/dL (ref 3.5–5.0)
Alkaline Phosphatase: 44 U/L (ref 38–126)
Anion gap: 12 (ref 5–15)
BUN: 13 mg/dL (ref 6–20)
CO2: 26 mmol/L (ref 22–32)
Calcium: 8.5 mg/dL — ABNORMAL LOW (ref 8.9–10.3)
Chloride: 105 mmol/L (ref 98–111)
Creatinine, Ser: 0.76 mg/dL (ref 0.44–1.00)
GFR calc Af Amer: >60 mL/min (ref >60)
GFR calc non Af Amer: >60 mL/min (ref >60)
Glucose, Bld: 95 mg/dL (ref 70–99)
Potassium: 3.7 mmol/L (ref 3.5–5.1)
Sodium: 143 mmol/L (ref 135–145)
Total Bilirubin: 0.4 mg/dL (ref 0.3–1.2)
Total Protein: 5.6 g/dL — ABNORMAL LOW (ref 6.5–8.1)

## 2018-12-24 LAB — CBC WITH DIFFERENTIAL/PLATELET
Abs Immature Granulocytes: 0 10*3/uL (ref 0.00–0.07)
Basophils Absolute: 0.1 10*3/uL (ref 0.0–0.1)
Basophils Relative: 3 %
Eosinophils Absolute: 0.3 10*3/uL (ref 0.0–0.5)
Eosinophils Relative: 10 %
HCT: 34.1 % — ABNORMAL LOW (ref 36.0–46.0)
Hemoglobin: 11.4 g/dL — ABNORMAL LOW (ref 12.0–15.0)
Immature Granulocytes: 0 %
Lymphocytes Relative: 45 %
Lymphs Abs: 1.2 10*3/uL (ref 0.7–4.0)
MCH: 35.5 pg — ABNORMAL HIGH (ref 26.0–34.0)
MCHC: 33.4 g/dL (ref 30.0–36.0)
MCV: 106.2 fL — ABNORMAL HIGH (ref 80.0–100.0)
Monocytes Absolute: 0.5 10*3/uL (ref 0.1–1.0)
Monocytes Relative: 19 %
Neutro Abs: 0.6 10*3/uL — ABNORMAL LOW (ref 1.7–7.7)
Neutrophils Relative %: 23 %
Platelets: 140 10*3/uL — ABNORMAL LOW (ref 150–400)
RBC: 3.21 MIL/uL — ABNORMAL LOW (ref 3.87–5.11)
RDW: 13.6 % (ref 11.5–15.5)
WBC: 2.7 10*3/uL — ABNORMAL LOW (ref 4.0–10.5)
nRBC: 0 % (ref 0.0–0.2)

## 2018-12-24 MED ORDER — SODIUM CHLORIDE 0.9% FLUSH
10.0000 mL | INTRAVENOUS | Status: DC | PRN
  Administered 2018-12-24: 10 mL
  Filled 2018-12-24: qty 10

## 2018-12-24 MED ORDER — SODIUM CHLORIDE 0.9 % IV SOLN
Freq: Once | INTRAVENOUS | Status: DC
  Filled 2018-12-24: qty 250

## 2018-12-24 MED ORDER — SODIUM CHLORIDE 0.9 % IV SOLN
Freq: Once | INTRAVENOUS | Status: AC
  Administered 2018-12-24: 09:00:00 via INTRAVENOUS
  Filled 2018-12-24: qty 250

## 2018-12-24 MED ORDER — HEPARIN SOD (PORK) LOCK FLUSH 100 UNIT/ML IV SOLN
500.0000 [IU] | Freq: Once | INTRAVENOUS | Status: AC | PRN
  Administered 2018-12-24: 11:00:00 500 [IU]
  Filled 2018-12-24: qty 5

## 2018-12-24 MED ORDER — DEXTROSE 5 % IV SOLN
66.0000 mg/m2 | Freq: Once | INTRAVENOUS | Status: AC
  Administered 2018-12-24: 100 mg via INTRAVENOUS
  Filled 2018-12-24: qty 30

## 2018-12-24 MED ORDER — SODIUM CHLORIDE 0.9 % IV SOLN
20.0000 mg | Freq: Once | INTRAVENOUS | Status: AC
  Administered 2018-12-24: 20 mg via INTRAVENOUS
  Filled 2018-12-24: qty 20

## 2018-12-24 NOTE — Progress Notes (Signed)
Ok to treat per Dr. Alvy Bimler with ANC of 0.6

## 2018-12-24 NOTE — Patient Instructions (Signed)
[facility] Discharge Instructions for Patients Receiving Chemotherapy  Today you received the following chemotherapy agents Carfilzomib  To help prevent nausea and vomiting after your treatment, we encourage you to take your nausea medication as directed by your MD.   If you develop nausea and vomiting that is not controlled by your nausea medication, call the clinic.   BELOW ARE SYMPTOMS THAT SHOULD BE REPORTED IMMEDIATELY:  *FEVER GREATER THAN 100.5 F  *CHILLS WITH OR WITHOUT FEVER  NAUSEA AND VOMITING THAT IS NOT CONTROLLED WITH YOUR NAUSEA MEDICATION  *UNUSUAL SHORTNESS OF BREATH  *UNUSUAL BRUISING OR BLEEDING  TENDERNESS IN MOUTH AND THROAT WITH OR WITHOUT PRESENCE OF ULCERS  *URINARY PROBLEMS  *BOWEL PROBLEMS  UNUSUAL RASH Items with * indicate a potential emergency and should be followed up as soon as possible.  Feel free to call the clinic should you have any questions or concerns. The clinic phone number is [phone].  Please show the [facility] at check-in to the Emergency Department and triage nurse. Coronavirus (COVID-19) Are you at risk?  Carfilzomib injection What is this medicine? CARFILZOMIB (kar FILZ oh mib) targets a specific protein within cancer cells and stops the cancer cells from growing. It is used to treat multiple myeloma. This medicine may be used for other purposes; ask your health care provider or pharmacist if you have questions. COMMON BRAND NAME(S): KYPROLIS What should I tell my health care provider before I take this medicine? They need to know if you have any of these conditions:  heart disease  history of blood clots  irregular heartbeat  kidney disease  liver disease  lung or breathing disease  an unusual or allergic reaction to carfilzomib, or other medicines, foods, dyes, or preservatives  pregnant or trying to get pregnant  breast-feeding How should I use this medicine? This medicine is for  injection or infusion into a vein. It is given by a health care professional in a hospital or clinic setting. Talk to your pediatrician regarding the use of this medicine in children. Special care may be needed. Overdosage: If you think you have taken too much of this medicine contact a poison control center or emergency room at once. NOTE: This medicine is only for you. Do not share this medicine with others. What if I miss a dose? It is important not to miss your dose. Call your doctor or health care professional if you are unable to keep an appointment. What may interact with this medicine? Interactions are not expected. Give your health care provider a list of all the medicines, herbs, non-prescription drugs, or dietary supplements you use. Also tell them if you smoke, drink alcohol, or use illegal drugs. Some items may interact with your medicine. This list may not describe all possible interactions. Give your health care provider a list of all the medicines, herbs, non-prescription drugs, or dietary supplements you use. Also tell them if you smoke, drink alcohol, or use illegal drugs. Some items may interact with your medicine. What should I watch for while using this medicine? Your condition will be monitored carefully while you are receiving this medicine. Report any side effects. Continue your course of treatment even though you feel ill unless your doctor tells you to stop. You may need blood work done while you are taking this medicine. Do not become pregnant while taking this medicine or for at least 6 months after stopping it. Women should inform their doctor if they wish to become pregnant  or think they might be pregnant. There is a potential for serious side effects to an unborn child. Men should not father a child while taking this medicine and for at least 3 months after stopping it. Talk to your health care professional or pharmacist for more information. Do not breast-feed an infant  while taking this medicine or for 2 weeks after the last dose. Check with your doctor or health care professional if you get an attack of severe diarrhea, nausea and vomiting, or if you sweat a lot. The loss of too much body fluid can make it dangerous for you to take this medicine. You may get dizzy. Do not drive, use machinery, or do anything that needs mental alertness until you know how this medicine affects you. Do not stand or sit up quickly, especially if you are an older patient. This reduces the risk of dizzy or fainting spells. What side effects may I notice from receiving this medicine? Side effects that you should report to your doctor or health care professional as soon as possible:  allergic reactions like skin rash, itching or hives, swelling of the face, lips, or tongue  confusion  dizziness  feeling faint or lightheaded  fever or chills  palpitations  seizures  signs and symptoms of bleeding such as bloody or black, tarry stools; red or dark-brown urine; spitting up blood or brown material that looks like coffee grounds; red spots on the skin; unusual bruising or bleeding including from the eye, gums, or nose  signs and symptoms of a blood clot such as breathing problems; changes in vision; chest pain; severe, sudden headache; pain, swelling, warmth in the leg; trouble speaking; sudden numbness or weakness of the face, arm or leg  signs and symptoms of kidney injury like trouble passing urine or change in the amount of urine  signs and symptoms of liver injury like dark yellow or brown urine; general ill feeling or flu-like symptoms; light-colored stools; loss of appetite; nausea; right upper belly pain; unusually weak or tired; yellowing of the eyes or skin Side effects that usually do not require medical attention (report to your doctor or health care professional if they continue or are bothersome):  back pain  cough  diarrhea  headache  muscle  cramps  vomiting This list may not describe all possible side effects. Call your doctor for medical advice about side effects. You may report side effects to FDA at 1-800-FDA-1088. Where should I keep my medicine? This drug is given in a hospital or clinic and will not be stored at home. NOTE: This sheet is a summary. It may not cover all possible information. If you have questions about this medicine, talk to your doctor, pharmacist, or health care provider.  2020 Elsevier/Gold Standard (2016-12-10 14:07:13)   Are you at risk for the Coronavirus (COVID-19)?  To be considered HIGH RISK for Coronavirus (COVID-19), you have to meet the following criteria:  . Traveled to [place], [place], [place], [place] or [place]; or in the [place] to [place], [place], [place], or [place]; and have fever, cough, and shortness of breath within the last 2 weeks of travel OR . Been in close contact with a person diagnosed with COVID-19 within the last 2 weeks and have fever, cough, and shortness of breath . IF YOU DO NOT MEET THESE CRITERIA, YOU ARE CONSIDERED LOW RISK FOR COVID-19.  What to do if you are HIGH RISK for COVID-19?  Marland Kitchen If you are having a  medical emergency, call 911. . Seek medical care right away. Before you go to a doctor's office, urgent care or emergency department, call ahead and tell them about your recent travel, contact with someone diagnosed with COVID-19, and your symptoms. You should receive instructions from your physician's office regarding next steps of care.  . When you arrive at healthcare provider, tell the healthcare staff immediately you have returned from visiting [place], [place], [place], [place] or [place]; or traveled in the [place] to [place], [place], [place], or [place]; in the last two weeks or you have been in close contact with a person diagnosed with COVID-19 in the last 2 weeks.   . Tell the health care staff about your symptoms: fever, cough  and shortness of breath. . After you have been seen by a medical provider, you will be either: o Tested for (COVID-19) and discharged home on quarantine except to seek medical care if symptoms worsen, and asked to  - Stay home and avoid contact with others until you get your results (4-5 days)  - Avoid travel on public transportation if possible (such as bus, train, or airplane) or o Sent to the Emergency Department by EMS for evaluation, COVID-19 testing, and possible admission depending on your condition and test results.  What to do if you are LOW RISK for COVID-19?  Reduce your risk of any infection by using the same precautions used for avoiding the common cold or flu:  Marland Kitchen Wash your hands often with soap and warm water for at least 20 seconds.  If soap and water are not readily available, use an alcohol-based hand sanitizer with at least 60% alcohol.  . If coughing or sneezing, cover your mouth and nose by coughing or sneezing into the elbow areas of your shirt or coat, into a tissue or into your sleeve (not your hands). . Avoid shaking hands with others and consider head nods or verbal greetings only. . Avoid touching your eyes, nose, or mouth with unwashed hands.  . Avoid close contact with people who are sick. . Avoid places or events with large numbers of people in one location, like concerts or sporting events. . Carefully consider travel plans you have or are making. . If you are planning any travel outside or inside the [place], visit the CDC's Travelers' Health webpage for the latest health notices. . If you have some symptoms but not all symptoms, continue to monitor at home and seek medical attention if your symptoms worsen. . If you are having a medical emergency, call 911.   [place] / e-Visit: [email or web address]         [facility]: [facility]:  [phone]                   [facility]: [phone]

## 2018-12-31 ENCOUNTER — Inpatient Hospital Stay: Payer: 59

## 2018-12-31 ENCOUNTER — Other Ambulatory Visit: Payer: Self-pay

## 2018-12-31 VITALS — BP 140/79 | HR 93 | Temp 98.3°F | Resp 18

## 2018-12-31 DIAGNOSIS — C9002 Multiple myeloma in relapse: Secondary | ICD-10-CM

## 2018-12-31 LAB — CBC WITH DIFFERENTIAL/PLATELET
Abs Immature Granulocytes: 0.01 10*3/uL (ref 0.00–0.07)
Basophils Absolute: 0.1 10*3/uL (ref 0.0–0.1)
Basophils Relative: 2 %
Eosinophils Absolute: 0.1 10*3/uL (ref 0.0–0.5)
Eosinophils Relative: 2 %
HCT: 32.9 % — ABNORMAL LOW (ref 36.0–46.0)
Hemoglobin: 11.2 g/dL — ABNORMAL LOW (ref 12.0–15.0)
Immature Granulocytes: 0 %
Lymphocytes Relative: 29 %
Lymphs Abs: 0.9 10*3/uL (ref 0.7–4.0)
MCH: 36.2 pg — ABNORMAL HIGH (ref 26.0–34.0)
MCHC: 34 g/dL (ref 30.0–36.0)
MCV: 106.5 fL — ABNORMAL HIGH (ref 80.0–100.0)
Monocytes Absolute: 0.4 10*3/uL (ref 0.1–1.0)
Monocytes Relative: 13 %
Neutro Abs: 1.6 10*3/uL — ABNORMAL LOW (ref 1.7–7.7)
Neutrophils Relative %: 54 %
Platelets: 104 10*3/uL — ABNORMAL LOW (ref 150–400)
RBC: 3.09 MIL/uL — ABNORMAL LOW (ref 3.87–5.11)
RDW: 13.7 % (ref 11.5–15.5)
WBC: 3 10*3/uL — ABNORMAL LOW (ref 4.0–10.5)
nRBC: 0 % (ref 0.0–0.2)

## 2018-12-31 LAB — COMPREHENSIVE METABOLIC PANEL
ALT: 12 U/L (ref 0–44)
AST: 17 U/L (ref 15–41)
Albumin: 3.8 g/dL (ref 3.5–5.0)
Alkaline Phosphatase: 42 U/L (ref 38–126)
Anion gap: 8 (ref 5–15)
BUN: 13 mg/dL (ref 6–20)
CO2: 26 mmol/L (ref 22–32)
Calcium: 8.6 mg/dL — ABNORMAL LOW (ref 8.9–10.3)
Chloride: 107 mmol/L (ref 98–111)
Creatinine, Ser: 0.73 mg/dL (ref 0.44–1.00)
GFR calc Af Amer: >60 mL/min (ref >60)
GFR calc non Af Amer: >60 mL/min (ref >60)
Glucose, Bld: 92 mg/dL (ref 70–99)
Potassium: 3.9 mmol/L (ref 3.5–5.1)
Sodium: 141 mmol/L (ref 135–145)
Total Bilirubin: 0.5 mg/dL (ref 0.3–1.2)
Total Protein: 5.6 g/dL — ABNORMAL LOW (ref 6.5–8.1)

## 2018-12-31 MED ORDER — SODIUM CHLORIDE 0.9 % IV SOLN
20.0000 mg | Freq: Once | INTRAVENOUS | Status: AC
  Administered 2018-12-31: 09:00:00 20 mg via INTRAVENOUS
  Filled 2018-12-31: qty 20

## 2018-12-31 MED ORDER — HEPARIN SOD (PORK) LOCK FLUSH 100 UNIT/ML IV SOLN
500.0000 [IU] | Freq: Once | INTRAVENOUS | Status: AC | PRN
  Administered 2018-12-31: 500 [IU]
  Filled 2018-12-31: qty 5

## 2018-12-31 MED ORDER — SODIUM CHLORIDE 0.9% FLUSH
10.0000 mL | INTRAVENOUS | Status: DC | PRN
  Administered 2018-12-31: 10 mL
  Filled 2018-12-31: qty 10

## 2018-12-31 MED ORDER — SODIUM CHLORIDE 0.9 % IV SOLN
Freq: Once | INTRAVENOUS | Status: AC
  Administered 2018-12-31: 09:00:00 via INTRAVENOUS
  Filled 2018-12-31: qty 250

## 2018-12-31 MED ORDER — DEXTROSE 5 % IV SOLN
66.0000 mg/m2 | Freq: Once | INTRAVENOUS | Status: AC
  Administered 2018-12-31: 100 mg via INTRAVENOUS
  Filled 2018-12-31: qty 5

## 2018-12-31 NOTE — Patient Instructions (Signed)
[facility] Discharge Instructions for Patients Receiving Chemotherapy  Today you received the following chemotherapy agents Carfilzomib  To help prevent nausea and vomiting after your treatment, we encourage you to take your nausea medication as directed by your MD.   If you develop nausea and vomiting that is not controlled by your nausea medication, call the clinic.   BELOW ARE SYMPTOMS THAT SHOULD BE REPORTED IMMEDIATELY:  *FEVER GREATER THAN 100.5 F  *CHILLS WITH OR WITHOUT FEVER  NAUSEA AND VOMITING THAT IS NOT CONTROLLED WITH YOUR NAUSEA MEDICATION  *UNUSUAL SHORTNESS OF BREATH  *UNUSUAL BRUISING OR BLEEDING  TENDERNESS IN MOUTH AND THROAT WITH OR WITHOUT PRESENCE OF ULCERS  *URINARY PROBLEMS  *BOWEL PROBLEMS  UNUSUAL RASH Items with * indicate a potential emergency and should be followed up as soon as possible.  Feel free to call the clinic should you have any questions or concerns. The clinic phone number is [phone].  Please show the CHEMO ALERT CARD at check-in to the Emergency Department and triage nurse. Coronavirus (COVID-19) Are you at risk?  Carfilzomib injection What is this medicine? CARFILZOMIB (kar FILZ oh mib) targets a specific protein within cancer cells and stops the cancer cells from growing. It is used to treat multiple myeloma. This medicine may be used for other purposes; ask your health care provider or pharmacist if you have questions. COMMON BRAND NAME(S): KYPROLIS What should I tell my health care provider before I take this medicine? They need to know if you have any of these conditions:  heart disease  history of blood clots  irregular heartbeat  kidney disease  liver disease  lung or breathing disease  an unusual or allergic reaction to carfilzomib, or other medicines, foods, dyes, or preservatives  pregnant or trying to get pregnant  breast-feeding How should I use this medicine? This medicine is for  injection or infusion into a vein. It is given by a health care professional in a hospital or clinic setting. Talk to your pediatrician regarding the use of this medicine in children. Special care may be needed. Overdosage: If you think you have taken too much of this medicine contact a poison control center or emergency room at once. NOTE: This medicine is only for you. Do not share this medicine with others. What if I miss a dose? It is important not to miss your dose. Call your doctor or health care professional if you are unable to keep an appointment. What may interact with this medicine? Interactions are not expected. Give your health care provider a list of all the medicines, herbs, non-prescription drugs, or dietary supplements you use. Also tell them if you smoke, drink alcohol, or use illegal drugs. Some items may interact with your medicine. This list may not describe all possible interactions. Give your health care provider a list of all the medicines, herbs, non-prescription drugs, or dietary supplements you use. Also tell them if you smoke, drink alcohol, or use illegal drugs. Some items may interact with your medicine. What should I watch for while using this medicine? Your condition will be monitored carefully while you are receiving this medicine. Report any side effects. Continue your course of treatment even though you feel ill unless your doctor tells you to stop. You may need blood work done while you are taking this medicine. Do not become pregnant while taking this medicine or for at least 6 months after stopping it. Women should inform their doctor if they wish to become pregnant   or think they might be pregnant. There is a potential for serious side effects to an unborn child. Men should not father a child while taking this medicine and for at least 3 months after stopping it. Talk to your health care professional or pharmacist for more information. Do not breast-feed an infant  while taking this medicine or for 2 weeks after the last dose. Check with your doctor or health care professional if you get an attack of severe diarrhea, nausea and vomiting, or if you sweat a lot. The loss of too much body fluid can make it dangerous for you to take this medicine. You may get dizzy. Do not drive, use machinery, or do anything that needs mental alertness until you know how this medicine affects you. Do not stand or sit up quickly, especially if you are an older patient. This reduces the risk of dizzy or fainting spells. What side effects may I notice from receiving this medicine? Side effects that you should report to your doctor or health care professional as soon as possible:  allergic reactions like skin rash, itching or hives, swelling of the face, lips, or tongue  confusion  dizziness  feeling faint or lightheaded  fever or chills  palpitations  seizures  signs and symptoms of bleeding such as bloody or black, tarry stools; red or dark-brown urine; spitting up blood or brown material that looks like coffee grounds; red spots on the skin; unusual bruising or bleeding including from the eye, gums, or nose  signs and symptoms of a blood clot such as breathing problems; changes in vision; chest pain; severe, sudden headache; pain, swelling, warmth in the leg; trouble speaking; sudden numbness or weakness of the face, arm or leg  signs and symptoms of kidney injury like trouble passing urine or change in the amount of urine  signs and symptoms of liver injury like dark yellow or brown urine; general ill feeling or flu-like symptoms; light-colored stools; loss of appetite; nausea; right upper belly pain; unusually weak or tired; yellowing of the eyes or skin Side effects that usually do not require medical attention (report to your doctor or health care professional if they continue or are bothersome):  back pain  cough  diarrhea  headache  muscle  cramps  vomiting This list may not describe all possible side effects. Call your doctor for medical advice about side effects. You may report side effects to FDA at 1-800-FDA-1088. Where should I keep my medicine? This drug is given in a hospital or clinic and will not be stored at home. NOTE: This sheet is a summary. It may not cover all possible information. If you have questions about this medicine, talk to your doctor, pharmacist, or health care provider.  2020 Elsevier/Gold Standard (2016-12-10 14:07:13)   Are you at risk for the Coronavirus (COVID-19)?  To be considered HIGH RISK for Coronavirus (COVID-19), you have to meet the following criteria:  . Traveled to [place], [place], [place], [place] or [place]; or in the [place] to [place], [place], [place], or [place]; and have fever, cough, and shortness of breath within the last 2 weeks of travel OR . Been in close contact with a person diagnosed with COVID-19 within the last 2 weeks and have fever, cough, and shortness of breath . IF YOU DO NOT MEET THESE CRITERIA, YOU ARE CONSIDERED LOW RISK FOR COVID-19.  What to do if you are HIGH RISK for COVID-19?  . If you are having a   medical emergency, call 911. . Seek medical care right away. Before you go to a doctor's office, urgent care or emergency department, call ahead and tell them about your recent travel, contact with someone diagnosed with COVID-19, and your symptoms. You should receive instructions from your physician's office regarding next steps of care.  . When you arrive at healthcare provider, tell the healthcare staff immediately you have returned from visiting [place], [place], [place], [place] or [place]; or traveled in the [place] to [place], [place], [place], or [place]; in the last two weeks or you have been in close contact with a person diagnosed with COVID-19 in the last 2 weeks.   . Tell the health care staff about your symptoms: fever, cough  and shortness of breath. . After you have been seen by a medical provider, you will be either: o Tested for (COVID-19) and discharged home on quarantine except to seek medical care if symptoms worsen, and asked to  - Stay home and avoid contact with others until you get your results (4-5 days)  - Avoid travel on public transportation if possible (such as bus, train, or airplane) or o Sent to the Emergency Department by EMS for evaluation, COVID-19 testing, and possible admission depending on your condition and test results.  What to do if you are LOW RISK for COVID-19?  Reduce your risk of any infection by using the same precautions used for avoiding the common cold or flu:  Marland Kitchen Wash your hands often with soap and warm water for at least 20 seconds.  If soap and water are not readily available, use an alcohol-based hand sanitizer with at least 60% alcohol.  . If coughing or sneezing, cover your mouth and nose by coughing or sneezing into the elbow areas of your shirt or coat, into a tissue or into your sleeve (not your hands). . Avoid shaking hands with others and consider head nods or verbal greetings only. . Avoid touching your eyes, nose, or mouth with unwashed hands.  . Avoid close contact with people who are sick. . Avoid places or events with large numbers of people in one location, like concerts or sporting events. . Carefully consider travel plans you have or are making. . If you are planning any travel outside or inside the [place], visit the CDC's Travelers' Health webpage for the latest health notices. . If you have some symptoms but not all symptoms, continue to monitor at home and seek medical attention if your symptoms worsen. . If you are having a medical emergency, call 911.   [place] / e-Visit: [email or web address]         [facility]: [facility]:  [phone]                   [facility]: [phone]

## 2019-01-14 ENCOUNTER — Encounter: Payer: Self-pay | Admitting: Hematology and Oncology

## 2019-01-14 ENCOUNTER — Inpatient Hospital Stay: Payer: 59 | Attending: Hematology and Oncology | Admitting: Hematology and Oncology

## 2019-01-14 ENCOUNTER — Inpatient Hospital Stay: Payer: 59

## 2019-01-14 ENCOUNTER — Other Ambulatory Visit: Payer: Self-pay

## 2019-01-14 VITALS — BP 140/83 | HR 95 | Temp 98.7°F | Resp 18

## 2019-01-14 DIAGNOSIS — R002 Palpitations: Secondary | ICD-10-CM | POA: Diagnosis not present

## 2019-01-14 DIAGNOSIS — Z5111 Encounter for antineoplastic chemotherapy: Secondary | ICD-10-CM | POA: Diagnosis not present

## 2019-01-14 DIAGNOSIS — Z79899 Other long term (current) drug therapy: Secondary | ICD-10-CM | POA: Insufficient documentation

## 2019-01-14 DIAGNOSIS — C9002 Multiple myeloma in relapse: Secondary | ICD-10-CM

## 2019-01-14 DIAGNOSIS — T451X5A Adverse effect of antineoplastic and immunosuppressive drugs, initial encounter: Secondary | ICD-10-CM | POA: Diagnosis not present

## 2019-01-14 DIAGNOSIS — D61810 Antineoplastic chemotherapy induced pancytopenia: Secondary | ICD-10-CM | POA: Insufficient documentation

## 2019-01-14 LAB — CBC WITH DIFFERENTIAL/PLATELET
Abs Immature Granulocytes: 0.01 10*3/uL (ref 0.00–0.07)
Basophils Absolute: 0 10*3/uL (ref 0.0–0.1)
Basophils Relative: 1 %
Eosinophils Absolute: 0.3 10*3/uL (ref 0.0–0.5)
Eosinophils Relative: 8 %
HCT: 34.2 % — ABNORMAL LOW (ref 36.0–46.0)
Hemoglobin: 11.3 g/dL — ABNORMAL LOW (ref 12.0–15.0)
Immature Granulocytes: 0 %
Lymphocytes Relative: 16 %
Lymphs Abs: 0.6 10*3/uL — ABNORMAL LOW (ref 0.7–4.0)
MCH: 35.3 pg — ABNORMAL HIGH (ref 26.0–34.0)
MCHC: 33 g/dL (ref 30.0–36.0)
MCV: 106.9 fL — ABNORMAL HIGH (ref 80.0–100.0)
Monocytes Absolute: 0.7 10*3/uL (ref 0.1–1.0)
Monocytes Relative: 19 %
Neutro Abs: 2.2 10*3/uL (ref 1.7–7.7)
Neutrophils Relative %: 56 %
Platelets: 171 10*3/uL (ref 150–400)
RBC: 3.2 MIL/uL — ABNORMAL LOW (ref 3.87–5.11)
RDW: 13.8 % (ref 11.5–15.5)
WBC: 3.9 10*3/uL — ABNORMAL LOW (ref 4.0–10.5)
nRBC: 0 % (ref 0.0–0.2)

## 2019-01-14 LAB — COMPREHENSIVE METABOLIC PANEL
ALT: 10 U/L (ref 0–44)
AST: 18 U/L (ref 15–41)
Albumin: 3.9 g/dL (ref 3.5–5.0)
Alkaline Phosphatase: 50 U/L (ref 38–126)
Anion gap: 10 (ref 5–15)
BUN: 14 mg/dL (ref 6–20)
CO2: 25 mmol/L (ref 22–32)
Calcium: 8.6 mg/dL — ABNORMAL LOW (ref 8.9–10.3)
Chloride: 105 mmol/L (ref 98–111)
Creatinine, Ser: 0.73 mg/dL (ref 0.44–1.00)
GFR calc Af Amer: >60 mL/min (ref >60)
GFR calc non Af Amer: >60 mL/min (ref >60)
Glucose, Bld: 103 mg/dL — ABNORMAL HIGH (ref 70–99)
Potassium: 3.8 mmol/L (ref 3.5–5.1)
Sodium: 140 mmol/L (ref 135–145)
Total Bilirubin: 0.3 mg/dL (ref 0.3–1.2)
Total Protein: 6.1 g/dL — ABNORMAL LOW (ref 6.5–8.1)

## 2019-01-14 LAB — TSH: TSH: 3.417 u[IU]/mL (ref 0.308–3.960)

## 2019-01-14 MED ORDER — HEPARIN SOD (PORK) LOCK FLUSH 100 UNIT/ML IV SOLN
500.0000 [IU] | Freq: Once | INTRAVENOUS | Status: AC | PRN
  Administered 2019-01-14: 500 [IU]
  Filled 2019-01-14: qty 5

## 2019-01-14 MED ORDER — SODIUM CHLORIDE 0.9% FLUSH
10.0000 mL | INTRAVENOUS | Status: DC | PRN
  Administered 2019-01-14: 10 mL
  Filled 2019-01-14: qty 10

## 2019-01-14 MED ORDER — SODIUM CHLORIDE 0.9 % IV SOLN
20.0000 mg | Freq: Once | INTRAVENOUS | Status: AC
  Administered 2019-01-14: 20 mg via INTRAVENOUS
  Filled 2019-01-14: qty 20

## 2019-01-14 MED ORDER — SODIUM CHLORIDE 0.9 % IV SOLN
Freq: Once | INTRAVENOUS | Status: AC
  Administered 2019-01-14: 10:00:00 via INTRAVENOUS
  Filled 2019-01-14: qty 250

## 2019-01-14 MED ORDER — SODIUM CHLORIDE 0.9 % IV SOLN
Freq: Once | INTRAVENOUS | Status: DC
  Filled 2019-01-14: qty 250

## 2019-01-14 MED ORDER — DEXTROSE 5 % IV SOLN
66.0000 mg/m2 | Freq: Once | INTRAVENOUS | Status: AC
  Administered 2019-01-14: 100 mg via INTRAVENOUS
  Filled 2019-01-14: qty 5

## 2019-01-14 NOTE — Assessment & Plan Note (Signed)
This is improving since discontinuation of pomalidomide

## 2019-01-14 NOTE — Assessment & Plan Note (Addendum)
Cause is unknown but could be related to side effects of treatment Her exam is benign I will order TSH for evaluation I recommend her to check her pulse and blood pressure at home if she have symptomatic palpitations again

## 2019-01-14 NOTE — Patient Instructions (Signed)
[facility] Discharge Instructions for Patients Receiving Chemotherapy  Today you received the following chemotherapy agents Carfilzomib  To help prevent nausea and vomiting after your treatment, we encourage you to take your nausea medication as directed by your MD.   If you develop nausea and vomiting that is not controlled by your nausea medication, call the clinic.   BELOW ARE SYMPTOMS THAT SHOULD BE REPORTED IMMEDIATELY:  *FEVER GREATER THAN 100.5 F  *CHILLS WITH OR WITHOUT FEVER  NAUSEA AND VOMITING THAT IS NOT CONTROLLED WITH YOUR NAUSEA MEDICATION  *UNUSUAL SHORTNESS OF BREATH  *UNUSUAL BRUISING OR BLEEDING  TENDERNESS IN MOUTH AND THROAT WITH OR WITHOUT PRESENCE OF ULCERS  *URINARY PROBLEMS  *BOWEL PROBLEMS  UNUSUAL RASH Items with * indicate a potential emergency and should be followed up as soon as possible.  Feel free to call the clinic should you have any questions or concerns. The clinic phone number is [phone].  Please show the CHEMO ALERT CARD at check-in to the Emergency Department and triage nurse. Coronavirus (COVID-19) Are you at risk?  Carfilzomib injection What is this medicine? CARFILZOMIB (kar FILZ oh mib) targets a specific protein within cancer cells and stops the cancer cells from growing. It is used to treat multiple myeloma. This medicine may be used for other purposes; ask your health care provider or pharmacist if you have questions. COMMON BRAND NAME(S): KYPROLIS What should I tell my health care provider before I take this medicine? They need to know if you have any of these conditions:  heart disease  history of blood clots  irregular heartbeat  kidney disease  liver disease  lung or breathing disease  an unusual or allergic reaction to carfilzomib, or other medicines, foods, dyes, or preservatives  pregnant or trying to get pregnant  breast-feeding How should I use this medicine? This medicine is for  injection or infusion into a vein. It is given by a health care professional in a hospital or clinic setting. Talk to your pediatrician regarding the use of this medicine in children. Special care may be needed. Overdosage: If you think you have taken too much of this medicine contact a poison control center or emergency room at once. NOTE: This medicine is only for you. Do not share this medicine with others. What if I miss a dose? It is important not to miss your dose. Call your doctor or health care professional if you are unable to keep an appointment. What may interact with this medicine? Interactions are not expected. Give your health care provider a list of all the medicines, herbs, non-prescription drugs, or dietary supplements you use. Also tell them if you smoke, drink alcohol, or use illegal drugs. Some items may interact with your medicine. This list may not describe all possible interactions. Give your health care provider a list of all the medicines, herbs, non-prescription drugs, or dietary supplements you use. Also tell them if you smoke, drink alcohol, or use illegal drugs. Some items may interact with your medicine. What should I watch for while using this medicine? Your condition will be monitored carefully while you are receiving this medicine. Report any side effects. Continue your course of treatment even though you feel ill unless your doctor tells you to stop. You may need blood work done while you are taking this medicine. Do not become pregnant while taking this medicine or for at least 6 months after stopping it. Women should inform their doctor if they wish to become pregnant   or think they might be pregnant. There is a potential for serious side effects to an unborn child. Men should not father a child while taking this medicine and for at least 3 months after stopping it. Talk to your health care professional or pharmacist for more information. Do not breast-feed an infant  while taking this medicine or for 2 weeks after the last dose. Check with your doctor or health care professional if you get an attack of severe diarrhea, nausea and vomiting, or if you sweat a lot. The loss of too much body fluid can make it dangerous for you to take this medicine. You may get dizzy. Do not drive, use machinery, or do anything that needs mental alertness until you know how this medicine affects you. Do not stand or sit up quickly, especially if you are an older patient. This reduces the risk of dizzy or fainting spells. What side effects may I notice from receiving this medicine? Side effects that you should report to your doctor or health care professional as soon as possible:  allergic reactions like skin rash, itching or hives, swelling of the face, lips, or tongue  confusion  dizziness  feeling faint or lightheaded  fever or chills  palpitations  seizures  signs and symptoms of bleeding such as bloody or black, tarry stools; red or dark-brown urine; spitting up blood or brown material that looks like coffee grounds; red spots on the skin; unusual bruising or bleeding including from the eye, gums, or nose  signs and symptoms of a blood clot such as breathing problems; changes in vision; chest pain; severe, sudden headache; pain, swelling, warmth in the leg; trouble speaking; sudden numbness or weakness of the face, arm or leg  signs and symptoms of kidney injury like trouble passing urine or change in the amount of urine  signs and symptoms of liver injury like dark yellow or brown urine; general ill feeling or flu-like symptoms; light-colored stools; loss of appetite; nausea; right upper belly pain; unusually weak or tired; yellowing of the eyes or skin Side effects that usually do not require medical attention (report to your doctor or health care professional if they continue or are bothersome):  back pain  cough  diarrhea  headache  muscle  cramps  vomiting This list may not describe all possible side effects. Call your doctor for medical advice about side effects. You may report side effects to FDA at 1-800-FDA-1088. Where should I keep my medicine? This drug is given in a hospital or clinic and will not be stored at home. NOTE: This sheet is a summary. It may not cover all possible information. If you have questions about this medicine, talk to your doctor, pharmacist, or health care provider.  2020 Elsevier/Gold Standard (2016-12-10 14:07:13)   Are you at risk for the Coronavirus (COVID-19)?  To be considered HIGH RISK for Coronavirus (COVID-19), you have to meet the following criteria:  . Traveled to [place], [place], [place], [place] or [place]; or in the [place] to [place], [place], [place], or [place]; and have fever, cough, and shortness of breath within the last 2 weeks of travel OR . Been in close contact with a person diagnosed with COVID-19 within the last 2 weeks and have fever, cough, and shortness of breath . IF YOU DO NOT MEET THESE CRITERIA, YOU ARE CONSIDERED LOW RISK FOR COVID-19.  What to do if you are HIGH RISK for COVID-19?  . If you are having a   medical emergency, call 911. . Seek medical care right away. Before you go to a doctor's office, urgent care or emergency department, call ahead and tell them about your recent travel, contact with someone diagnosed with COVID-19, and your symptoms. You should receive instructions from your physician's office regarding next steps of care.  . When you arrive at healthcare provider, tell the healthcare staff immediately you have returned from visiting [place], [place], [place], [place] or [place]; or traveled in the [place] to [place], [place], [place], or [place]; in the last two weeks or you have been in close contact with a person diagnosed with COVID-19 in the last 2 weeks.   . Tell the health care staff about your symptoms: fever, cough  and shortness of breath. . After you have been seen by a medical provider, you will be either: o Tested for (COVID-19) and discharged home on quarantine except to seek medical care if symptoms worsen, and asked to  - Stay home and avoid contact with others until you get your results (4-5 days)  - Avoid travel on public transportation if possible (such as bus, train, or airplane) or o Sent to the Emergency Department by EMS for evaluation, COVID-19 testing, and possible admission depending on your condition and test results.  What to do if you are LOW RISK for COVID-19?  Reduce your risk of any infection by using the same precautions used for avoiding the common cold or flu:  Marland Kitchen Wash your hands often with soap and warm water for at least 20 seconds.  If soap and water are not readily available, use an alcohol-based hand sanitizer with at least 60% alcohol.  . If coughing or sneezing, cover your mouth and nose by coughing or sneezing into the elbow areas of your shirt or coat, into a tissue or into your sleeve (not your hands). . Avoid shaking hands with others and consider head nods or verbal greetings only. . Avoid touching your eyes, nose, or mouth with unwashed hands.  . Avoid close contact with people who are sick. . Avoid places or events with large numbers of people in one location, like concerts or sporting events. . Carefully consider travel plans you have or are making. . If you are planning any travel outside or inside the [place], visit the CDC's Travelers' Health webpage for the latest health notices. . If you have some symptoms but not all symptoms, continue to monitor at home and seek medical attention if your symptoms worsen. . If you are having a medical emergency, call 911.   [place] / e-Visit: [email or web address]         [facility]: [facility]:  [phone]                   [facility]: [phone]

## 2019-01-14 NOTE — Progress Notes (Signed)
[facility] OFFICE PROGRESS NOTE  Patient Care Team: [name], MD as PCP - General [name], MD as Consulting Physician (Oncology) [name], MD as Consulting Physician (Neurosurgery) [name], MD as Consulting Physician (Hematology and Oncology) [name], [name], MD as Referring Physician (Hematology and Oncology)  ASSESSMENT & PLAN:  Multiple myeloma in relapse ([place]) Overall, she tolerated treatment well except for occasional palpitation Her heart rhythm is within normal limits on exam today Her pancytopenia is improving The myeloma panel is pending I will call her next week with test results We will proceed with treatment as scheduled  Pancytopenia due to antineoplastic chemotherapy [facility]) This is improving since discontinuation of pomalidomide  Palpitations with regular cardiac rhythm Cause is unknown but could be related to side effects of treatment Her exam is benign I will order TSH for evaluation I recommend her to check her pulse and blood pressure at home if she have symptomatic palpitations again   Orders Placed This Encounter  Procedures  . TSH    Standing Status:   Future    Number of Occurrences:   1    Standing Expiration Date:   02/18/2020    INTERVAL HISTORY: Please see below for problem oriented charting. She returns for further follow-up She has occasional palpitations since her recent treatment Otherwise, she tolerated treatment well Denies nausea No recent infection, fever or chills No recent bleeding Her chronic pain is stable  SUMMARY OF ONCOLOGIC HISTORY: Oncology History  Multiple myeloma in relapse ([place])  07/21/2007 Bone Marrow Biopsy   Case #: [id number] Bone marrow showed myeloma   07/21/2007 Miscellaneous   She was diagnosed in May 2009. Had several cycles of RVD   10/14/2007 Bone Marrow Biopsy   Case #: [id number] repeat bone marrow biopsy showed good response to Rx   12/31/2007 Bone Marrow  Transplant   She had autologous BMT at [facility]   07/19/2009 Bone Marrow Biopsy   Case #: [id number] Bone marrow biopsy showed only 1% plasma cells   10/10/2010 Bone Marrow Biopsy   [id number] Bone marrow biopsy showed 2% plasma cells   09/25/2011 Bone Marrow Biopsy   Accession #: [id number] Bone marrow only showed 4% plasma cells with ligh chain excess   10/14/2011 - 06/13/2013 Chemotherapy   She received Revlimid only, discontinued due to pancytopenia   06/18/2015 - 09/13/2015 Chemotherapy   She resumed taking Revlimid and dexamethasone. Treatment is stopped due to minimum response and pancytopenia   10/19/2015 -  Chemotherapy   The patient had Velcade for chemotherapy treatment along with weekly Daratumumab. Last dose of Velcade on 02/29/16, stopped due to progression. Pomalidomide added on 03/14/16   04/11/2016 Adverse Reaction   Delay resumption of Pomalyst cycle 2 until 04/16/16 due to neutropenia   09/16/2016 PET scan   1. No findings of active bony lymphoma. Prior activity in the sternum and thoracic spine has resolved. 2. Focal hypermetabolic activity along the sigmoid colon with some local inflammatory stranding in the adjacent mesentery, maximum SUV 11.0. This is probably from mild acute diverticulitis. There is no hypermetabolic activity in this vicinity 4 months ago to suggest that this is from colon cancer. Correlate with any symptoms. 3. Acute right maxillary sinusitis. 4. Thoracolumbar compression fractures, vertebral augmentation at the L1 level.   09/17/2016 Procedure   CT guided bone marrow biopsy of right posterior iliac bone with both aspirate and core samples obtained.   09/17/2016 Bone Marrow Biopsy   Bone Marrow, Aspirate,Biopsy,  and Clot, right iliac - SLIGHTLY HYPOCELLULAR BONE MARROW FOR AGE WITH PLASMA CELL NEOPLASM. - TRILINEAGE HEMATOPOIESIS. - SEE COMMENT. PERIPHERAL BLOOD: - LYMPHOPENIA. - THROMBOCYTOPENIA. Diagnosis Note The bone marrow is slightly  hypocellular for age with trilineage hematopoiesis and non-specific myeloid changes likely related to previous therapy. Plasma cells are increased in number representing 8% of all cells in the aspirate, but with lack of large aggregates or sheets in the clot and biopsy sections. Immunohistochemical stains highlight the slightly increased plasma cell component in the marrow which shows kappa light chain restriction consistent with residual/recurrent plasma cell neoplasm. Correlation with cytogenetic and FISH studies recommended   09/17/2016 Pathology Results   Normal bone marrow cytogenetics and FISH   04/06/2018 PET scan   1. No abnormal focus of increased radiotracer uptake identified to suggest active disease or progression of disease. 2. Similar appearance of thoracic and lumbar compression deformities with associated kyphosis deformity. 3. Thickening of the endometrium is again noted. This is indeterminate but is considered abnormal in a postmenopausal female. Consider further evaluation with pelvic sonogram.    12/15/2018 Echocardiogram   IMPRESSIONS      1. Left ventricular ejection fraction, by visual estimation, is 55 to 60%. The left ventricle has normal function. Normal left ventricular size. There is no left ventricular hypertrophy.  2. Global right ventricle has normal systolic function.The right ventricular size is mildly enlarged. No increase in right ventricular wall thickness.  3. Left atrial size was normal.  4. Right atrial size was normal.  5. The mitral valve is normal in structure. No evidence of mitral valve regurgitation. No evidence of mitral stenosis.  6. The tricuspid valve is normal in structure. Tricuspid valve regurgitation is mild.  7. The aortic valve is normal in structure. Aortic valve regurgitation was not visualized by color flow Doppler. Structurally normal aortic valve, with no evidence of sclerosis or stenosis.  8. The pulmonic valve was normal in structure.  Pulmonic valve regurgitation is not visualized by color flow Doppler.  9. The inferior vena cava is normal in size with greater than 50% respiratory variability, suggesting right atrial pressure of 3 mmHg. 10. The average left ventricular global longitudinal strain is 19.2 %.       REVIEW OF SYSTEMS:   Constitutional: Denies fevers, chills or abnormal weight loss Eyes: Denies blurriness of vision Ears, nose, mouth, throat, and face: Denies mucositis or sore throat Respiratory: Denies cough, dyspnea or wheezes Gastrointestinal:  Denies nausea, heartburn or change in bowel habits Skin: Denies abnormal skin rashes Lymphatics: Denies new lymphadenopathy or easy bruising Neurological:Denies numbness, tingling or new weaknesses Behavioral/Psych: Mood is stable, no new changes  All other systems were reviewed with the patient and are negative.  I have reviewed the past medical history, past surgical history, social history and family history with the patient and they are unchanged from previous note.  ALLERGIES:  is allergic to hydromorphone.  MEDICATIONS:  Current Outpatient Medications  Medication Sig Dispense Refill  . albuterol (PROVENTIL HFA;VENTOLIN HFA) 108 (90 Base) MCG/ACT inhaler Inhale 2 puffs into the lungs every 4 (four) hours as needed for wheezing or shortness of breath. 1 Inhaler 2  . aspirin 81 MG tablet Take 81 mg by mouth.    . cholecalciferol (VITAMIN D) 1000 UNITS tablet Take 1,000 Units by mouth daily.    . Multiple Vitamins-Minerals (ONE-A-DAY 50 PLUS PO) Take by mouth daily.    . ondansetron (ZOFRAN) 8 MG tablet Take 1 tablet (8 mg total) by  mouth every 8 (eight) hours as needed for nausea. 30 tablet 3  . oxycodone (OXY-IR) 5 MG capsule Take 5 mg by mouth every 4 (four) hours as needed. pain    . Oxycodone HCl (OXYCONTIN) 60 MG TB12 Take 1 tablet by mouth 2 (two) times daily.    . pantoprazole (PROTONIX) 40 MG tablet TAKE (1) TABLET DAILY AS NEEDED. 90 tablet 11  .  prochlorperazine (COMPAZINE) 10 MG tablet Take 1 tablet (10 mg total) by mouth every 6 (six) hours as needed for nausea or vomiting. 30 tablet 1  . valACYclovir (VALTREX) 1000 MG tablet TAKE 1 TABLET ONCE DAILY. 30 tablet 11   No current facility-administered medications for this visit.    Facility-Administered Medications Ordered in Other Visits  Medication Dose Route Frequency Provider Last Rate Last Dose  . 0.9 %  sodium chloride infusion   Intravenous Once Holleigh Crihfield, MD      . 0.9 %  sodium chloride infusion   Intravenous Once Alvy Bimler, Cameka Rae, MD      . carfilzomib (KYPROLIS) 106 mg in dextrose 5 % 100 mL chemo infusion  70 mg/m2 (Order-Specific) Intravenous Once Alvy Bimler, Kathelene Rumberger, MD      . dexamethasone (DECADRON) 20 mg in sodium chloride 0.9 % 50 mL IVPB  20 mg Intravenous Once Alvy Bimler, Meighan Treto, MD      . heparin lock flush 100 unit/mL  500 Units Intracatheter Once PRN Alvy Bimler, Haydn Cush, MD      . sodium chloride flush (NS) 0.9 % injection 10 mL  10 mL Intracatheter PRN Alvy Bimler, Darilyn Storbeck, MD        PHYSICAL EXAMINATION: ECOG PERFORMANCE STATUS: 1 - Symptomatic but completely ambulatory GENERAL:alert, no distress and comfortable SKIN: skin color, texture, turgor are normal, no rashes or significant lesions EYES: normal, Conjunctiva are pink and non-injected, sclera clear OROPHARYNX:no exudate, no erythema and lips, buccal mucosa, and tongue normal  NECK: supple, thyroid normal size, non-tender, without nodularity LYMPH:  no palpable lymphadenopathy in the cervical, axillary or inguinal LUNGS: clear to auscultation and percussion with normal breathing effort HEART: regular rate & rhythm and no murmurs and no lower extremity edema ABDOMEN:abdomen soft, non-tender and normal bowel sounds Musculoskeletal:no cyanosis of digits and no clubbing  NEURO: alert & oriented x 3 with fluent speech, no focal motor/sensory deficits  LABORATORY DATA:  I have reviewed the data as listed    Component Value Date/Time    NA 140 01/14/2019 0830   NA 140 03/11/2017 1150   K 3.8 01/14/2019 0830   K 3.9 03/11/2017 1150   CL 105 01/14/2019 0830   CL 107 08/30/2012 1055   CO2 25 01/14/2019 0830   CO2 29 03/11/2017 1150   GLUCOSE 103 (H) 01/14/2019 0830   GLUCOSE 93 03/11/2017 1150   GLUCOSE 102 (H) 08/30/2012 1055   BUN 14 01/14/2019 0830   BUN 12.5 03/11/2017 1150   CREATININE 0.73 01/14/2019 0830   CREATININE 0.77 07/23/2018 0808   CREATININE 0.8 03/11/2017 1150   CALCIUM 8.6 (L) 01/14/2019 0830   CALCIUM 8.7 03/11/2017 1150   PROT 6.1 (L) 01/14/2019 0830   PROT 6.0 (L) 03/11/2017 1150   ALBUMIN 3.9 01/14/2019 0830   ALBUMIN 3.8 03/11/2017 1150   AST 18 01/14/2019 0830   AST 21 07/23/2018 0808   AST 18 03/11/2017 1150   ALT 10 01/14/2019 0830   ALT 16 07/23/2018 0808   ALT 14 03/11/2017 1150   ALKPHOS 50 01/14/2019 0830   ALKPHOS 45 03/11/2017 1150  BILITOT 0.3 01/14/2019 0830   BILITOT 0.5 07/23/2018 0808   BILITOT 0.37 03/11/2017 1150   GFRNONAA >60 01/14/2019 0830   GFRNONAA >60 07/23/2018 0808   GFRAA >60 01/14/2019 0830   GFRAA >60 07/23/2018 0808    No results found for: SPEP, UPEP  Lab Results  Component Value Date   WBC 3.9 (L) 01/14/2019   NEUTROABS 2.2 01/14/2019   HGB 11.3 (L) 01/14/2019   HCT 34.2 (L) 01/14/2019   MCV 106.9 (H) 01/14/2019   PLT 171 01/14/2019      Chemistry      Component Value Date/Time   NA 140 01/14/2019 0830   NA 140 03/11/2017 1150   K 3.8 01/14/2019 0830   K 3.9 03/11/2017 1150   CL 105 01/14/2019 0830   CL 107 08/30/2012 1055   CO2 25 01/14/2019 0830   CO2 29 03/11/2017 1150   BUN 14 01/14/2019 0830   BUN 12.5 03/11/2017 1150   CREATININE 0.73 01/14/2019 0830   CREATININE 0.77 07/23/2018 0808   CREATININE 0.8 03/11/2017 1150      Component Value Date/Time   CALCIUM 8.6 (L) 01/14/2019 0830   CALCIUM 8.7 03/11/2017 1150   ALKPHOS 50 01/14/2019 0830   ALKPHOS 45 03/11/2017 1150   AST 18 01/14/2019 0830   AST 21 07/23/2018 0808    AST 18 03/11/2017 1150   ALT 10 01/14/2019 0830   ALT 16 07/23/2018 0808   ALT 14 03/11/2017 1150   BILITOT 0.3 01/14/2019 0830   BILITOT 0.5 07/23/2018 0808   BILITOT 0.37 03/11/2017 1150      All questions were answered. The patient knows to call the clinic with any problems, questions or concerns. No barriers to learning was detected.  I spent 15 minutes counseling the patient face to face. The total time spent in the appointment was 20 minutes and more than 50% was on counseling and review of test results  [name], MD 01/14/2019 9:38 AM

## 2019-01-14 NOTE — Assessment & Plan Note (Signed)
Overall, she tolerated treatment well except for occasional palpitation Her heart rhythm is within normal limits on exam today Her pancytopenia is improving The myeloma panel is pending I will call her next week with test results We will proceed with treatment as scheduled

## 2019-01-17 LAB — MULTIPLE MYELOMA PANEL, SERUM
Albumin SerPl Elph-Mcnc: 3.6 g/dL (ref 2.9–4.4)
Albumin/Glob SerPl: 1.9 — ABNORMAL HIGH (ref 0.7–1.7)
Alpha 1: 0.3 g/dL (ref 0.0–0.4)
Alpha2 Glob SerPl Elph-Mcnc: 0.7 g/dL (ref 0.4–1.0)
B-Globulin SerPl Elph-Mcnc: 0.8 g/dL (ref 0.7–1.3)
Gamma Glob SerPl Elph-Mcnc: 0.2 g/dL — ABNORMAL LOW (ref 0.4–1.8)
Globulin, Total: 2 g/dL — ABNORMAL LOW (ref 2.2–3.9)
IgA: <5 mg/dL — ABNORMAL LOW (ref 87–352)
IgG (Immunoglobin G), Serum: 221 mg/dL — ABNORMAL LOW (ref 586–1602)
IgM (Immunoglobulin M), Srm: 9 mg/dL — ABNORMAL LOW (ref 26–217)
Total Protein ELP: 5.6 g/dL — ABNORMAL LOW (ref 6.0–8.5)

## 2019-01-17 LAB — KAPPA/LAMBDA LIGHT CHAINS
Kappa free light chain: 282.3 mg/L — ABNORMAL HIGH (ref 3.3–19.4)
Lambda free light chains: <1.5 mg/L — ABNORMAL LOW (ref 5.7–26.3)

## 2019-01-18 ENCOUNTER — Telehealth: Payer: Self-pay | Admitting: Hematology and Oncology

## 2019-01-18 NOTE — Telephone Encounter (Signed)
I spoke with the patient and reviewed the light chain results with her Unfortunately, the kappa light chain is mildly elevated compared to her baseline However, light chains studies could be unreliable and another option would be to consider continuing treatment and repeat next month Currently, she is not symptomatic Ultimately, I recommend her to discuss this with her husband and we will call her tomorrow for final decision of what to do

## 2019-01-19 ENCOUNTER — Other Ambulatory Visit: Payer: Self-pay

## 2019-01-19 ENCOUNTER — Encounter: Payer: Self-pay | Admitting: Hematology and Oncology

## 2019-01-19 ENCOUNTER — Inpatient Hospital Stay (HOSPITAL_BASED_OUTPATIENT_CLINIC_OR_DEPARTMENT_OTHER): Payer: 59 | Admitting: Hematology and Oncology

## 2019-01-19 DIAGNOSIS — C9002 Multiple myeloma in relapse: Secondary | ICD-10-CM | POA: Diagnosis not present

## 2019-01-19 NOTE — Assessment & Plan Note (Signed)
I have reviewed recent serum light chain results with the patient and her husband Her most recent myeloma light chain ratio is over 100 So far, she is not symptomatic We reviewed the current guidelines We discussed the limitation of light chain measurement at times She has been followed closely at [facility] We discussed treatment options including experimental therapy such as CAR T-cells/bite cell therapy versus standard chemo/immunotherapy is approved by NCCN guidelines Her husband seems to be interested with Belantamab mafodotin but unfortunately, I do not anticipate this will be available until after January We also discussed other treatment options, taking into account that she has progressed on daratumumab, pomalidomide, lenalidomide, Velcade as well as current treatment with carfilzomib Other options that were discussed including Ixazomib, addition of cyclophosphamide to her current treatment, bendamustine, selinexor and potentially venetoclax if repeat bone marrow biopsy confirmed translocation t(11;14) Ultimately, she is undecided I have advised her to make appointment to discuss next steps with her hematologist at [facility] She will continue her treatment as scheduled this week and next week

## 2019-01-19 NOTE — Progress Notes (Signed)
[place] OFFICE PROGRESS NOTE  Patient Care Team: [name], MD as PCP - General [name], MD as Consulting Physician (Oncology) [name], MD as Consulting Physician (Neurosurgery) [name], MD as Consulting Physician (Hematology and Oncology) [name], [name], MD as Referring Physician (Hematology and Oncology)  ASSESSMENT & PLAN:  Multiple myeloma in relapse [facility]) I have reviewed recent serum light chain results with the patient and her husband Her most recent myeloma light chain ratio is over 100 So far, she is not symptomatic We reviewed the current guidelines We discussed the limitation of light chain measurement at times She has been followed closely at [facility] We discussed treatment options including experimental therapy such as CAR T-cells/bite cell therapy versus standard chemo/immunotherapy is approved by NCCN guidelines Her husband seems to be interested with [place] mafodotin but unfortunately, I do not anticipate this will be available until after January We also discussed other treatment options, taking into account that she has progressed on daratumumab, pomalidomide, lenalidomide, Velcade as well as current treatment with carfilzomib Other options that were discussed including Ixazomib, addition of cyclophosphamide to her current treatment, bendamustine, selinexor and potentially venetoclax if repeat bone marrow biopsy confirmed translocation t(11;14) Ultimately, she is undecided I have advised her to make appointment to discuss next steps with her hematologist at [facility] She will continue her treatment as scheduled this week and next week   No orders of the defined types were placed in this encounter.   INTERVAL HISTORY: Please see below for problem oriented charting. She returns with her husband for further discussion about plan of care So far, she tolerated treatment well without major side effects except for  occasional palpitation She had no recent infection, fever or chills No new bone pain  SUMMARY OF ONCOLOGIC HISTORY: Oncology History  Multiple myeloma in relapse ([place])  07/21/2007 Bone Marrow Biopsy   Case #: [id number] Bone marrow showed myeloma   07/21/2007 Miscellaneous   She was diagnosed in May 2009. Had several cycles of RVD   10/14/2007 Bone Marrow Biopsy   Case #: [id number] repeat bone marrow biopsy showed good response to Rx   12/31/2007 Bone Marrow Transplant   She had autologous BMT at [facility]   07/19/2009 Bone Marrow Biopsy   Case #: [id number] Bone marrow biopsy showed only 1% plasma cells   10/10/2010 Bone Marrow Biopsy   [id number] Bone marrow biopsy showed 2% plasma cells   09/25/2011 Bone Marrow Biopsy   Accession #: [id number] Bone marrow only showed 4% plasma cells with ligh chain excess   10/14/2011 - 06/13/2013 Chemotherapy   She received Revlimid only, discontinued due to pancytopenia   06/18/2015 - 09/13/2015 Chemotherapy   She resumed taking Revlimid and dexamethasone. Treatment is stopped due to minimum response and pancytopenia   10/19/2015 -  Chemotherapy   The patient had Velcade for chemotherapy treatment along with weekly Daratumumab. Last dose of Velcade on 02/29/16, stopped due to progression. Pomalidomide added on 03/14/16   04/11/2016 Adverse Reaction   Delay resumption of Pomalyst cycle 2 until 04/16/16 due to neutropenia   09/16/2016 PET scan   1. No findings of active bony lymphoma. Prior activity in the sternum and thoracic spine has resolved. 2. Focal hypermetabolic activity along the sigmoid colon with some local inflammatory stranding in the adjacent mesentery, maximum SUV 11.0. This is probably from mild acute diverticulitis. There is no hypermetabolic activity in this vicinity 4 months ago to suggest that this is  from colon cancer. Correlate with any symptoms. 3. Acute right maxillary sinusitis. 4. Thoracolumbar compression fractures, vertebral  augmentation at the L1 level.   09/17/2016 Procedure   CT guided bone marrow biopsy of right posterior iliac bone with both aspirate and core samples obtained.   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 - TRILINEAGE HEMATOPOIESIS. - SEE COMMENT. PERIPHERAL BLOOD: - LYMPHOPENIA. - THROMBOCYTOPENIA. Diagnosis Note The bone marrow is slightly hypocellular for age with trilineage hematopoiesis and non-specific myeloid changes likely related to previous therapy. Plasma cells are increased in number representing 8% of all cells in the aspirate, but with lack of large aggregates or sheets in the clot and biopsy sections. Immunohistochemical stains highlight the slightly increased plasma cell component in the marrow which shows kappa light chain restriction consistent with residual/recurrent plasma cell neoplasm. Correlation with cytogenetic and FISH studies recommended   09/17/2016 Pathology Results   Normal bone marrow cytogenetics and FISH   04/06/2018 PET scan   1. No abnormal focus of increased radiotracer uptake identified to suggest active disease or progression of disease. 2. Similar appearance of thoracic and lumbar compression deformities with associated kyphosis deformity. 3. Thickening of the endometrium is again noted. This is indeterminate but is considered abnormal in a postmenopausal female. Consider further evaluation with pelvic sonogram.    12/15/2018 Echocardiogram   IMPRESSIONS      1. Left ventricular ejection fraction, by visual estimation, is 55 to 60%. The left ventricle has normal function. Normal left ventricular size. There is no left ventricular hypertrophy.  2. Global right ventricle has normal systolic function.The right ventricular size is mildly enlarged. No increase in right ventricular wall thickness.  3. Left atrial size was normal.  4. Right  atrial size was normal.  5. The mitral valve is normal in structure. No evidence of mitral valve regurgitation. No evidence of mitral stenosis.  6. The tricuspid valve is normal in structure. Tricuspid valve regurgitation is mild.  7. The aortic valve is normal in structure. Aortic valve regurgitation was not visualized by color flow Doppler. Structurally normal aortic valve, with no evidence of sclerosis or stenosis.  8. The pulmonic valve was normal in structure. Pulmonic valve regurgitation is not visualized by color flow Doppler.  9. The inferior vena cava is normal in size with greater than 50% respiratory variability, suggesting right atrial pressure of 3 mmHg. 10. The average left ventricular global longitudinal strain is 19.2 %.       REVIEW OF SYSTEMS:   Constitutional: Denies fevers, chills or abnormal weight loss Eyes: Denies blurriness of vision Ears, nose, mouth, throat, and face: Denies mucositis or sore throat Respiratory: Denies cough, dyspnea or wheezes Cardiovascular: Denies palpitation, chest discomfort or lower extremity swelling Gastrointestinal:  Denies nausea, heartburn or change in bowel habits Skin: Denies abnormal skin rashes Lymphatics: Denies new lymphadenopathy or easy bruising Neurological:Denies numbness, tingling or new weaknesses Behavioral/Psych: Mood is stable, no new changes  All other systems were reviewed with the patient and are negative.  I have reviewed the past medical history, past surgical history, social history and family history with the patient and they are unchanged from previous note.  ALLERGIES:  is allergic to hydromorphone.  MEDICATIONS:  Current Outpatient Medications  Medication Sig Dispense Refill  . albuterol (PROVENTIL HFA;VENTOLIN HFA) 108 (90 Base) MCG/ACT inhaler Inhale 2 puffs into the lungs every 4 (four) hours as needed for  wheezing or shortness of breath. 1 Inhaler 2  . aspirin 81 MG tablet Take 81 mg by mouth.    .  cholecalciferol (VITAMIN D) 1000 UNITS tablet Take 1,000 Units by mouth daily.    . Multiple Vitamins-Minerals (ONE-A-DAY 50 PLUS PO) Take by mouth daily.    . ondansetron (ZOFRAN) 8 MG tablet Take 1 tablet (8 mg total) by mouth every 8 (eight) hours as needed for nausea. 30 tablet 3  . oxycodone (OXY-IR) 5 MG capsule Take 5 mg by mouth every 4 (four) hours as needed. pain    . Oxycodone HCl (OXYCONTIN) 60 MG TB12 Take 1 tablet by mouth 2 (two) times daily.    . pantoprazole (PROTONIX) 40 MG tablet TAKE (1) TABLET DAILY AS NEEDED. 90 tablet 11  . prochlorperazine (COMPAZINE) 10 MG tablet Take 1 tablet (10 mg total) by mouth every 6 (six) hours as needed for nausea or vomiting. 30 tablet 1  . valACYclovir (VALTREX) 1000 MG tablet TAKE 1 TABLET ONCE DAILY. 30 tablet 11   No current facility-administered medications for this visit.     PHYSICAL EXAMINATION: ECOG PERFORMANCE STATUS: 1 - Symptomatic but completely ambulatory  GENERAL:alert, no distress and comfortable Musculoskeletal:no cyanosis of digits and no clubbing  NEURO: alert & oriented x 3 with fluent speech, no focal motor/sensory deficits  LABORATORY DATA:  I have reviewed the data as listed    Component Value Date/Time   NA 140 01/14/2019 0830   NA 140 03/11/2017 1150   K 3.8 01/14/2019 0830   K 3.9 03/11/2017 1150   CL 105 01/14/2019 0830   CL 107 08/30/2012 1055   CO2 25 01/14/2019 0830   CO2 29 03/11/2017 1150   GLUCOSE 103 (H) 01/14/2019 0830   GLUCOSE 93 03/11/2017 1150   GLUCOSE 102 (H) 08/30/2012 1055   BUN 14 01/14/2019 0830   BUN 12.5 03/11/2017 1150   CREATININE 0.73 01/14/2019 0830   CREATININE 0.77 07/23/2018 0808   CREATININE 0.8 03/11/2017 1150   CALCIUM 8.6 (L) 01/14/2019 0830   CALCIUM 8.7 03/11/2017 1150   PROT 6.1 (L) 01/14/2019 0830   PROT 6.0 (L) 03/11/2017 1150   ALBUMIN 3.9 01/14/2019 0830   ALBUMIN 3.8 03/11/2017 1150   AST 18 01/14/2019 0830   AST 21 07/23/2018 0808   AST 18 03/11/2017  1150   ALT 10 01/14/2019 0830   ALT 16 07/23/2018 0808   ALT 14 03/11/2017 1150   ALKPHOS 50 01/14/2019 0830   ALKPHOS 45 03/11/2017 1150   BILITOT 0.3 01/14/2019 0830   BILITOT 0.5 07/23/2018 0808   BILITOT 0.37 03/11/2017 1150   GFRNONAA >60 01/14/2019 0830   GFRNONAA >60 07/23/2018 0808   GFRAA >60 01/14/2019 0830   GFRAA >60 07/23/2018 0808    No results found for: SPEP, UPEP  Lab Results  Component Value Date   WBC 3.9 (L) 01/14/2019   NEUTROABS 2.2 01/14/2019   HGB 11.3 (L) 01/14/2019   HCT 34.2 (L) 01/14/2019   MCV 106.9 (H) 01/14/2019   PLT 171 01/14/2019      Chemistry      Component Value Date/Time   NA 140 01/14/2019 0830   NA 140 03/11/2017 1150   K 3.8 01/14/2019 0830   K 3.9 03/11/2017 1150   CL 105 01/14/2019 0830   CL 107 08/30/2012 1055   CO2 25 01/14/2019 0830   CO2 29 03/11/2017 1150   BUN 14 01/14/2019 0830   BUN 12.5 03/11/2017 1150   CREATININE  0.73 01/14/2019 0830   CREATININE 0.77 07/23/2018 0808   CREATININE 0.8 03/11/2017 1150      Component Value Date/Time   CALCIUM 8.6 (L) 01/14/2019 0830   CALCIUM 8.7 03/11/2017 1150   ALKPHOS 50 01/14/2019 0830   ALKPHOS 45 03/11/2017 1150   AST 18 01/14/2019 0830   AST 21 07/23/2018 0808   AST 18 03/11/2017 1150   ALT 10 01/14/2019 0830   ALT 16 07/23/2018 0808   ALT 14 03/11/2017 1150   BILITOT 0.3 01/14/2019 0830   BILITOT 0.5 07/23/2018 0808   BILITOT 0.37 03/11/2017 1150       All questions were answered. The patient knows to call the clinic with any problems, questions or concerns. No barriers to learning was detected.  I spent 50 minutes counseling the patient face to face. The total time spent in the appointment was 55 minutes and more than 50% was on counseling and review of test results  [name], MD 01/19/2019 12:40 PM

## 2019-01-21 ENCOUNTER — Other Ambulatory Visit: Payer: Self-pay

## 2019-01-21 ENCOUNTER — Inpatient Hospital Stay: Payer: 59

## 2019-01-21 VITALS — BP 151/82 | HR 98 | Temp 98.6°F | Resp 18 | Wt 117.8 lb

## 2019-01-21 DIAGNOSIS — C9002 Multiple myeloma in relapse: Secondary | ICD-10-CM

## 2019-01-21 LAB — COMPREHENSIVE METABOLIC PANEL
ALT: 12 U/L (ref 0–44)
AST: 20 U/L (ref 15–41)
Albumin: 3.9 g/dL (ref 3.5–5.0)
Alkaline Phosphatase: 55 U/L (ref 38–126)
Anion gap: 11 (ref 5–15)
BUN: 16 mg/dL (ref 6–20)
CO2: 27 mmol/L (ref 22–32)
Calcium: 8.7 mg/dL — ABNORMAL LOW (ref 8.9–10.3)
Chloride: 104 mmol/L (ref 98–111)
Creatinine, Ser: 0.71 mg/dL (ref 0.44–1.00)
GFR calc Af Amer: >60 mL/min (ref >60)
GFR calc non Af Amer: >60 mL/min (ref >60)
Glucose, Bld: 93 mg/dL (ref 70–99)
Potassium: 3.8 mmol/L (ref 3.5–5.1)
Sodium: 142 mmol/L (ref 135–145)
Total Bilirubin: 0.4 mg/dL (ref 0.3–1.2)
Total Protein: 5.9 g/dL — ABNORMAL LOW (ref 6.5–8.1)

## 2019-01-21 LAB — CBC WITH DIFFERENTIAL/PLATELET
Abs Immature Granulocytes: 0.01 10*3/uL (ref 0.00–0.07)
Basophils Absolute: 0 10*3/uL (ref 0.0–0.1)
Basophils Relative: 1 %
Eosinophils Absolute: 0.1 10*3/uL (ref 0.0–0.5)
Eosinophils Relative: 3 %
HCT: 33.5 % — ABNORMAL LOW (ref 36.0–46.0)
Hemoglobin: 11.4 g/dL — ABNORMAL LOW (ref 12.0–15.0)
Immature Granulocytes: 0 %
Lymphocytes Relative: 20 %
Lymphs Abs: 0.8 10*3/uL (ref 0.7–4.0)
MCH: 36.1 pg — ABNORMAL HIGH (ref 26.0–34.0)
MCHC: 34 g/dL (ref 30.0–36.0)
MCV: 106 fL — ABNORMAL HIGH (ref 80.0–100.0)
Monocytes Absolute: 0.6 10*3/uL (ref 0.1–1.0)
Monocytes Relative: 13 %
Neutro Abs: 2.7 10*3/uL (ref 1.7–7.7)
Neutrophils Relative %: 63 %
Platelets: 100 10*3/uL — ABNORMAL LOW (ref 150–400)
RBC: 3.16 MIL/uL — ABNORMAL LOW (ref 3.87–5.11)
RDW: 13 % (ref 11.5–15.5)
WBC: 4.2 10*3/uL (ref 4.0–10.5)
nRBC: 0 % (ref 0.0–0.2)

## 2019-01-21 MED ORDER — SODIUM CHLORIDE 0.9 % IV SOLN
Freq: Once | INTRAVENOUS | Status: AC
  Administered 2019-01-21: 10:00:00 via INTRAVENOUS
  Filled 2019-01-21: qty 250

## 2019-01-21 MED ORDER — HEPARIN SOD (PORK) LOCK FLUSH 100 UNIT/ML IV SOLN
500.0000 [IU] | Freq: Once | INTRAVENOUS | Status: AC | PRN
  Administered 2019-01-21: 500 [IU]
  Filled 2019-01-21: qty 5

## 2019-01-21 MED ORDER — SODIUM CHLORIDE 0.9% FLUSH
10.0000 mL | INTRAVENOUS | Status: DC | PRN
  Administered 2019-01-21: 10 mL
  Filled 2019-01-21: qty 10

## 2019-01-21 MED ORDER — SODIUM CHLORIDE 0.9 % IV SOLN
20.0000 mg | Freq: Once | INTRAVENOUS | Status: AC
  Administered 2019-01-21: 20 mg via INTRAVENOUS
  Filled 2019-01-21: qty 2

## 2019-01-21 MED ORDER — SODIUM CHLORIDE 0.9 % IV SOLN
Freq: Once | INTRAVENOUS | Status: AC
  Administered 2019-01-21: 09:00:00 via INTRAVENOUS
  Filled 2019-01-21: qty 250

## 2019-01-21 MED ORDER — DEXTROSE 5 % IV SOLN
66.0000 mg/m2 | Freq: Once | INTRAVENOUS | Status: AC
  Administered 2019-01-21: 100 mg via INTRAVENOUS
  Filled 2019-01-21: qty 30

## 2019-01-21 NOTE — Patient Instructions (Signed)
[facility] Discharge Instructions for Patients Receiving Chemotherapy  Today you received the following chemotherapy agents Carfilzomib (KYPROLIS).  To help prevent nausea and vomiting after your treatment, we encourage you to take your nausea medication as prescribed.   If you develop nausea and vomiting that is not controlled by your nausea medication, call the clinic.   BELOW ARE SYMPTOMS THAT SHOULD BE REPORTED IMMEDIATELY:  *FEVER GREATER THAN 100.5 F  *CHILLS WITH OR WITHOUT FEVER  NAUSEA AND VOMITING THAT IS NOT CONTROLLED WITH YOUR NAUSEA MEDICATION  *UNUSUAL SHORTNESS OF BREATH  *UNUSUAL BRUISING OR BLEEDING  TENDERNESS IN MOUTH AND THROAT WITH OR WITHOUT PRESENCE OF ULCERS  *URINARY PROBLEMS  *BOWEL PROBLEMS  UNUSUAL RASH Items with * indicate a potential emergency and should be followed up as soon as possible.  Feel free to call the clinic should you have any questions or concerns. The clinic phone number is [phone].  Please show the CHEMO ALERT CARD at check-in to the Emergency Department and triage nurse.  Coronavirus (COVID-19) Are you at risk?  Are you at risk for the Coronavirus (COVID-19)?  To be considered HIGH RISK for Coronavirus (COVID-19), you have to meet the following criteria:  . Traveled to [place], [place], [place], [place] or [place]; or in the [place] to [place], [place], [place], or [place]; and have fever, cough, and shortness of breath within the last 2 weeks of travel OR . Been in close contact with a person diagnosed with COVID-19 within the last 2 weeks and have fever, cough, and shortness of breath . IF YOU DO NOT MEET THESE CRITERIA, YOU ARE CONSIDERED LOW RISK FOR COVID-19.  What to do if you are HIGH RISK for COVID-19?  . If you are having a medical emergency, call 911. . Seek medical care right away. Before you go to a doctor's office, urgent care or emergency department, call ahead and tell them  about your recent travel, contact with someone diagnosed with COVID-19, and your symptoms. You should receive instructions from your physician's office regarding next steps of care.  . When you arrive at healthcare provider, tell the healthcare staff immediately you have returned from visiting [place], [place], [place], [place] or [place]; or traveled in the [place] to [place], [place], [place], or [place]; in the last two weeks or you have been in close contact with a person diagnosed with COVID-19 in the last 2 weeks.   . Tell the health care staff about your symptoms: fever, cough and shortness of breath. . After you have been seen by a medical provider, you will be either: o Tested for (COVID-19) and discharged home on quarantine except to seek medical care if symptoms worsen, and asked to  - Stay home and avoid contact with others until you get your results (4-5 days)  - Avoid travel on public transportation if possible (such as bus, train, or airplane) or o Sent to the Emergency Department by EMS for evaluation, COVID-19 testing, and possible admission depending on your condition and test results.  What to do if you are LOW RISK for COVID-19?  Reduce your risk of any infection by using the same precautions used for avoiding the common cold or flu:  . Wash your hands often with soap and warm water for at least 20 seconds.  If soap and water are not readily available, use an alcohol-based hand sanitizer with at least 60% alcohol.  . If coughing or   cover your mouth and nose by coughing or sneezing into the elbow areas of your shirt or coat, into a tissue or into your sleeve (not your hands). . Avoid shaking hands with others and consider head nods or verbal greetings only. . Avoid touching your eyes, nose, or mouth with unwashed hands.  . Avoid close contact with people who are sick. . Avoid places or events with large numbers of people in one location, like concerts or  sporting events. . Carefully consider travel plans you have or are making. . If you are planning any travel outside or inside the [place], visit the CDC's Travelers' Health webpage for the latest health notices. . If you have some symptoms but not all symptoms, continue to monitor at home and seek medical attention if your symptoms worsen. . If you are having a medical emergency, call 911.   [place] / e-Visit: [email or web address]         [facility]: [facility]: [phone]                   [facility]: [phone]

## 2019-01-28 ENCOUNTER — Other Ambulatory Visit: Payer: Self-pay | Admitting: Hematology and Oncology

## 2019-01-28 ENCOUNTER — Other Ambulatory Visit: Payer: Self-pay

## 2019-01-28 ENCOUNTER — Inpatient Hospital Stay: Payer: 59

## 2019-01-28 VITALS — BP 137/91 | HR 98 | Temp 98.0°F | Resp 18

## 2019-01-28 DIAGNOSIS — C9002 Multiple myeloma in relapse: Secondary | ICD-10-CM | POA: Diagnosis not present

## 2019-01-28 LAB — CBC WITH DIFFERENTIAL/PLATELET
Abs Immature Granulocytes: 0.01 10*3/uL (ref 0.00–0.07)
Basophils Absolute: 0 10*3/uL (ref 0.0–0.1)
Basophils Relative: 1 %
Eosinophils Absolute: 0.1 10*3/uL (ref 0.0–0.5)
Eosinophils Relative: 3 %
HCT: 32.5 % — ABNORMAL LOW (ref 36.0–46.0)
Hemoglobin: 10.8 g/dL — ABNORMAL LOW (ref 12.0–15.0)
Immature Granulocytes: 0 %
Lymphocytes Relative: 21 %
Lymphs Abs: 0.7 10*3/uL (ref 0.7–4.0)
MCH: 35.8 pg — ABNORMAL HIGH (ref 26.0–34.0)
MCHC: 33.2 g/dL (ref 30.0–36.0)
MCV: 107.6 fL — ABNORMAL HIGH (ref 80.0–100.0)
Monocytes Absolute: 0.5 10*3/uL (ref 0.1–1.0)
Monocytes Relative: 13 %
Neutro Abs: 2.2 10*3/uL (ref 1.7–7.7)
Neutrophils Relative %: 62 %
Platelets: 106 10*3/uL — ABNORMAL LOW (ref 150–400)
RBC: 3.02 MIL/uL — ABNORMAL LOW (ref 3.87–5.11)
RDW: 13.2 % (ref 11.5–15.5)
WBC: 3.5 10*3/uL — ABNORMAL LOW (ref 4.0–10.5)
nRBC: 0 % (ref 0.0–0.2)

## 2019-01-28 LAB — COMPREHENSIVE METABOLIC PANEL
ALT: 13 U/L (ref 0–44)
AST: 19 U/L (ref 15–41)
Albumin: 4 g/dL (ref 3.5–5.0)
Alkaline Phosphatase: 49 U/L (ref 38–126)
Anion gap: 10 (ref 5–15)
BUN: 18 mg/dL (ref 6–20)
CO2: 24 mmol/L (ref 22–32)
Calcium: 8.5 mg/dL — ABNORMAL LOW (ref 8.9–10.3)
Chloride: 106 mmol/L (ref 98–111)
Creatinine, Ser: 0.69 mg/dL (ref 0.44–1.00)
GFR calc Af Amer: >60 mL/min (ref >60)
GFR calc non Af Amer: >60 mL/min (ref >60)
Glucose, Bld: 103 mg/dL — ABNORMAL HIGH (ref 70–99)
Potassium: 3.9 mmol/L (ref 3.5–5.1)
Sodium: 140 mmol/L (ref 135–145)
Total Bilirubin: 0.6 mg/dL (ref 0.3–1.2)
Total Protein: 5.8 g/dL — ABNORMAL LOW (ref 6.5–8.1)

## 2019-01-28 MED ORDER — SODIUM CHLORIDE 0.9 % IV SOLN
20.0000 mg | Freq: Once | INTRAVENOUS | Status: AC
  Administered 2019-01-28: 20 mg via INTRAVENOUS
  Filled 2019-01-28: qty 20

## 2019-01-28 MED ORDER — HEPARIN SOD (PORK) LOCK FLUSH 100 UNIT/ML IV SOLN
500.0000 [IU] | Freq: Once | INTRAVENOUS | Status: AC | PRN
  Administered 2019-01-28: 500 [IU]
  Filled 2019-01-28: qty 5

## 2019-01-28 MED ORDER — SODIUM CHLORIDE 0.9% FLUSH
10.0000 mL | INTRAVENOUS | Status: DC | PRN
  Administered 2019-01-28: 10 mL
  Filled 2019-01-28: qty 10

## 2019-01-28 MED ORDER — SODIUM CHLORIDE 0.9 % IV SOLN
Freq: Once | INTRAVENOUS | Status: DC
  Filled 2019-01-28: qty 250

## 2019-01-28 MED ORDER — DEXAMETHASONE SODIUM PHOSPHATE 10 MG/ML IJ SOLN
INTRAMUSCULAR | Status: AC
  Filled 2019-01-28: qty 1

## 2019-01-28 MED ORDER — SODIUM CHLORIDE 0.9 % IV SOLN
Freq: Once | INTRAVENOUS | Status: AC
  Administered 2019-01-28: 09:00:00 via INTRAVENOUS
  Filled 2019-01-28: qty 250

## 2019-01-28 MED ORDER — DEXTROSE 5 % IV SOLN
66.0000 mg/m2 | Freq: Once | INTRAVENOUS | Status: AC
  Administered 2019-01-28: 100 mg via INTRAVENOUS
  Filled 2019-01-28: qty 30

## 2019-01-28 NOTE — Patient Instructions (Addendum)
[facility] Discharge Instructions for Patients Receiving Chemotherapy  Today you received the following chemotherapy agents Carfilzomib (KYPROLIS).  To help prevent nausea and vomiting after your treatment, we encourage you to take your nausea medication as prescribed.  If you develop nausea and vomiting that is not controlled by your nausea medication, call the clinic.   BELOW ARE SYMPTOMS THAT SHOULD BE REPORTED IMMEDIATELY:  *FEVER GREATER THAN 100.5 F  *CHILLS WITH OR WITHOUT FEVER  NAUSEA AND VOMITING THAT IS NOT CONTROLLED WITH YOUR NAUSEA MEDICATION  *UNUSUAL SHORTNESS OF BREATH  *UNUSUAL BRUISING OR BLEEDING  TENDERNESS IN MOUTH AND THROAT WITH OR WITHOUT PRESENCE OF ULCERS  *URINARY PROBLEMS  *BOWEL PROBLEMS  UNUSUAL RASH Items with * indicate a potential emergency and should be followed up as soon as possible.  Feel free to call the clinic should you have any questions or concerns. The clinic phone number is [phone].  Please show the CHEMO ALERT CARD at check-in to the Emergency Department and triage nurse.   

## 2019-02-11 ENCOUNTER — Other Ambulatory Visit: Payer: Self-pay | Admitting: Hematology and Oncology

## 2019-02-11 ENCOUNTER — Inpatient Hospital Stay: Payer: 59 | Attending: Hematology and Oncology | Admitting: Hematology and Oncology

## 2019-02-11 ENCOUNTER — Encounter: Payer: Self-pay | Admitting: Hematology and Oncology

## 2019-02-11 ENCOUNTER — Telehealth: Payer: Self-pay | Admitting: Hematology and Oncology

## 2019-02-11 DIAGNOSIS — C9002 Multiple myeloma in relapse: Secondary | ICD-10-CM | POA: Insufficient documentation

## 2019-02-11 DIAGNOSIS — Z7189 Other specified counseling: Secondary | ICD-10-CM

## 2019-02-11 DIAGNOSIS — Z79899 Other long term (current) drug therapy: Secondary | ICD-10-CM | POA: Insufficient documentation

## 2019-02-11 DIAGNOSIS — D61810 Antineoplastic chemotherapy induced pancytopenia: Secondary | ICD-10-CM | POA: Insufficient documentation

## 2019-02-11 DIAGNOSIS — Z5111 Encounter for antineoplastic chemotherapy: Secondary | ICD-10-CM | POA: Insufficient documentation

## 2019-02-11 DIAGNOSIS — T451X5A Adverse effect of antineoplastic and immunosuppressive drugs, initial encounter: Secondary | ICD-10-CM | POA: Insufficient documentation

## 2019-02-11 NOTE — Assessment & Plan Note (Signed)
We have extensive discussions about plan of care today and I have addressed all her questions and concerns

## 2019-02-11 NOTE — Assessment & Plan Note (Signed)
I have reviewed documentation from [facility] and discussed the plan of care with the patient and her husband I have also reviewed her blood work that was drawn recently which show continuous progression of elevated light chains Thankfully, she remained asymptomatic with no evidence of organ dysfunction Per recommendation from [facility], we will proceed to schedule Kyprolis treatment weekly for this month and next month until further notice or when she is confirmed to proceed with clinical trial with CAR T-cells

## 2019-02-11 NOTE — Telephone Encounter (Signed)
Confirmed December/January appointments with patient.  °

## 2019-02-11 NOTE — Progress Notes (Signed)
HEMATOLOGY-ONCOLOGY ELECTRONIC VISIT PROGRESS NOTE  Patient Care Team: [name], MD as PCP - General [name], MD as Consulting Physician (Oncology) [name], MD as Consulting Physician (Neurosurgery) [name], MD as Consulting Physician (Hematology and Oncology) [name], [name], MD as Referring Physician (Hematology and Oncology)  Due to limitation of technology, I have called the patient to discuss plan of care and replacement of an electronic face-to-face visit  ASSESSMENT & PLAN:  Multiple myeloma in relapse [facility]) I have reviewed documentation from [facility] and discussed the plan of care with the patient and her husband I have also reviewed her blood work that was drawn recently which show continuous progression of elevated light chains Thankfully, she remained asymptomatic with no evidence of organ dysfunction Per recommendation from [facility], we will proceed to schedule Kyprolis treatment weekly for this month and next month until further notice or when she is confirmed to proceed with clinical trial with CAR T-cells  Goals of care, counseling/discussion We have extensive discussions about plan of care today and I have addressed all her questions and concerns   No orders of the defined types were placed in this encounter.   INTERVAL HISTORY: Please see below for problem oriented charting. I called the patient at her scheduled appointment time.  Both the patient and her husband are present. I have reviewed documentation as outlined from [facility] The patient is feeling well and remained asymptomatic despite disease progression Per the patient understanding, she is waiting for insurance approval to proceed with car T-cell She had numerous questions and concerns about the plan of care  SUMMARY OF ONCOLOGIC HISTORY: Oncology History  Multiple myeloma in relapse ([place])  07/21/2007 Bone Marrow Biopsy   Case #: [id number] Bone marrow showed myeloma   07/21/2007  Miscellaneous   She was diagnosed in May 2009. Had several cycles of RVD   10/14/2007 Bone Marrow Biopsy   Case #: [id number] repeat bone marrow biopsy showed good response to Rx   12/31/2007 Bone Marrow Transplant   She had autologous BMT at [facility]   07/19/2009 Bone Marrow Biopsy   Case #: [id number] Bone marrow biopsy showed only 1% plasma cells   10/10/2010 Bone Marrow Biopsy   [id number] Bone marrow biopsy showed 2% plasma cells   09/25/2011 Bone Marrow Biopsy   Accession #: [id number] Bone marrow only showed 4% plasma cells with ligh chain excess   10/14/2011 - 06/13/2013 Chemotherapy   She received Revlimid only, discontinued due to pancytopenia   06/18/2015 - 09/13/2015 Chemotherapy   She resumed taking Revlimid and dexamethasone. Treatment is stopped due to minimum response and pancytopenia   10/19/2015 -  Chemotherapy   The patient had Velcade for chemotherapy treatment along with weekly Daratumumab. Last dose of Velcade on 02/29/16, stopped due to progression. Pomalidomide added on 03/14/16   04/11/2016 Adverse Reaction   Delay resumption of Pomalyst cycle 2 until 04/16/16 due to neutropenia   09/16/2016 PET scan   1. No findings of active bony lymphoma. Prior activity in the sternum and thoracic spine has resolved. 2. Focal hypermetabolic activity along the sigmoid colon with some local inflammatory stranding in the adjacent mesentery, maximum SUV 11.0. This is probably from mild acute diverticulitis. There is no hypermetabolic activity in this vicinity 4 months ago to suggest that this is from colon cancer. Correlate with any symptoms. 3. Acute right maxillary sinusitis. 4. Thoracolumbar compression fractures, vertebral augmentation at the L1 level.   09/17/2016 Procedure   CT guided bone  marrow biopsy of right posterior iliac bone with both aspirate and core samples obtained.   09/17/2016 Bone Marrow Biopsy   Bone Marrow, Aspirate,Biopsy, and Clot, right iliac - SLIGHTLY  HYPOCELLULAR BONE MARROW FOR AGE WITH PLASMA CELL NEOPLASM. - TRILINEAGE HEMATOPOIESIS. - SEE COMMENT. PERIPHERAL BLOOD: - LYMPHOPENIA. - THROMBOCYTOPENIA. Diagnosis Note The bone marrow is slightly hypocellular for age with trilineage hematopoiesis and non-specific myeloid changes likely related to previous therapy. Plasma cells are increased in number representing 8% of all cells in the aspirate, but with lack of large aggregates or sheets in the clot and biopsy sections. Immunohistochemical stains highlight the slightly increased plasma cell component in the marrow which shows kappa light chain restriction consistent with residual/recurrent plasma cell neoplasm. Correlation with cytogenetic and FISH studies recommended   09/17/2016 Pathology Results   Normal bone marrow cytogenetics and FISH   04/06/2018 PET scan   1. No abnormal focus of increased radiotracer uptake identified to suggest active disease or progression of disease. 2. Similar appearance of thoracic and lumbar compression deformities with associated kyphosis deformity. 3. Thickening of the endometrium is again noted. This is indeterminate but is considered abnormal in a postmenopausal female. Consider further evaluation with pelvic sonogram.    12/15/2018 Echocardiogram   IMPRESSIONS      1. Left ventricular ejection fraction, by visual estimation, is 55 to 60%. The left ventricle has normal function. Normal left ventricular size. There is no left ventricular hypertrophy.  2. Global right ventricle has normal systolic function.The right ventricular size is mildly enlarged. No increase in right ventricular wall thickness.  3. Left atrial size was normal.  4. Right atrial size was normal.  5. The mitral valve is normal in structure. No evidence of mitral valve regurgitation. No evidence of mitral stenosis.  6. The tricuspid valve is normal in structure. Tricuspid valve regurgitation is mild.  7. The aortic valve is normal in  structure. Aortic valve regurgitation was not visualized by color flow Doppler. Structurally normal aortic valve, with no evidence of sclerosis or stenosis.  8. The pulmonic valve was normal in structure. Pulmonic valve regurgitation is not visualized by color flow Doppler.  9. The inferior vena cava is normal in size with greater than 50% respiratory variability, suggesting right atrial pressure of 3 mmHg. 10. The average left ventricular global longitudinal strain is 19.2 %.       REVIEW OF SYSTEMS:   Constitutional: Denies fevers, chills or abnormal weight loss Eyes: Denies blurriness of vision Ears, nose, mouth, throat, and face: Denies mucositis or sore throat Respiratory: Denies cough, dyspnea or wheezes Cardiovascular: Denies palpitation, chest discomfort Gastrointestinal:  Denies nausea, heartburn or change in bowel habits Skin: Denies abnormal skin rashes Lymphatics: Denies new lymphadenopathy or easy bruising Neurological:Denies numbness, tingling or new weaknesses Behavioral/Psych: Mood is stable, no new changes  Extremities: No lower extremity edema All other systems were reviewed with the patient and are negative.  I have reviewed the past medical history, past surgical history, social history and family history with the patient and they are unchanged from previous note.  ALLERGIES:  is allergic to hydromorphone.  MEDICATIONS:  Current Outpatient Medications  Medication Sig Dispense Refill  . albuterol (PROVENTIL HFA;VENTOLIN HFA) 108 (90 Base) MCG/ACT inhaler Inhale 2 puffs into the lungs every 4 (four) hours as needed for wheezing or shortness of breath. 1 Inhaler 2  . aspirin 81 MG tablet Take 81 mg by mouth.    . cholecalciferol (VITAMIN D) 1000 UNITS tablet Take  1,000 Units by mouth daily.    . Multiple Vitamins-Minerals (ONE-A-DAY 50 PLUS PO) Take by mouth daily.    . ondansetron (ZOFRAN) 8 MG tablet Take 1 tablet (8 mg total) by mouth every 8 (eight) hours as  needed for nausea. 30 tablet 3  . oxycodone (OXY-IR) 5 MG capsule Take 5 mg by mouth every 4 (four) hours as needed. pain    . Oxycodone HCl (OXYCONTIN) 60 MG TB12 Take 1 tablet by mouth 2 (two) times daily.    . pantoprazole (PROTONIX) 40 MG tablet TAKE (1) TABLET DAILY AS NEEDED. 90 tablet 11  . prochlorperazine (COMPAZINE) 10 MG tablet Take 1 tablet (10 mg total) by mouth every 6 (six) hours as needed for nausea or vomiting. 30 tablet 1  . valACYclovir (VALTREX) 1000 MG tablet TAKE 1 TABLET ONCE DAILY. 30 tablet 11   No current facility-administered medications for this visit.     PHYSICAL EXAMINATION: ECOG PERFORMANCE STATUS: 0 - Asymptomatic  LABORATORY DATA:  I have reviewed the data as listed CMP Latest Ref Rng & Units 01/28/2019 01/21/2019 01/14/2019  Glucose 70 - 99 mg/dL 103(H) 93 103(H)  BUN 6 - 20 mg/dL 18 16 14   Creatinine 0.44 - 1.00 mg/dL 0.69 0.71 0.73  Sodium 135 - 145 mmol/L 140 142 140  Potassium 3.5 - 5.1 mmol/L 3.9 3.8 3.8  Chloride 98 - 111 mmol/L 106 104 105  CO2 22 - 32 mmol/L 24 27 25   Calcium 8.9 - 10.3 mg/dL 8.5(L) 8.7(L) 8.6(L)  Total Protein 6.5 - 8.1 g/dL 5.8(L) 5.9(L) 6.1(L)  Total Bilirubin 0.3 - 1.2 mg/dL 0.6 0.4 0.3  Alkaline Phos 38 - 126 U/L 49 55 50  AST 15 - 41 U/L 19 20 18   ALT 0 - 44 U/L 13 12 10     Lab Results  Component Value Date   WBC 3.5 (L) 01/28/2019   HGB 10.8 (L) 01/28/2019   HCT 32.5 (L) 01/28/2019   MCV 107.6 (H) 01/28/2019   PLT 106 (L) 01/28/2019   NEUTROABS 2.2 01/28/2019    I discussed the assessment and treatment plan with the patient. The patient was provided an opportunity to ask questions and all were answered. The patient agreed with the plan and demonstrated an understanding of the instructions. The patient was advised to call back or seek an in-person evaluation if the symptoms worsen or if the condition fails to improve as anticipated.    I spent 15 minutes counseling the patient face to face. The total time  spent in the appointment was 20 minutes and more than 50% was on counseling and review of test results  [name], MD 02/11/2019 10:02 AM

## 2019-02-18 ENCOUNTER — Inpatient Hospital Stay: Payer: 59

## 2019-02-18 ENCOUNTER — Other Ambulatory Visit: Payer: Self-pay

## 2019-02-18 VITALS — BP 129/87 | HR 96 | Temp 98.0°F | Resp 17

## 2019-02-18 DIAGNOSIS — C9002 Multiple myeloma in relapse: Secondary | ICD-10-CM | POA: Diagnosis not present

## 2019-02-18 DIAGNOSIS — Z95828 Presence of other vascular implants and grafts: Secondary | ICD-10-CM

## 2019-02-18 DIAGNOSIS — Z79899 Other long term (current) drug therapy: Secondary | ICD-10-CM | POA: Diagnosis not present

## 2019-02-18 DIAGNOSIS — T451X5A Adverse effect of antineoplastic and immunosuppressive drugs, initial encounter: Secondary | ICD-10-CM | POA: Diagnosis not present

## 2019-02-18 DIAGNOSIS — Z5111 Encounter for antineoplastic chemotherapy: Secondary | ICD-10-CM | POA: Diagnosis not present

## 2019-02-18 DIAGNOSIS — D61810 Antineoplastic chemotherapy induced pancytopenia: Secondary | ICD-10-CM | POA: Diagnosis not present

## 2019-02-18 LAB — CBC WITH DIFFERENTIAL/PLATELET
Abs Immature Granulocytes: 0.02 10*3/uL (ref 0.00–0.07)
Basophils Absolute: 0 10*3/uL (ref 0.0–0.1)
Basophils Relative: 1 %
Eosinophils Absolute: 0.1 10*3/uL (ref 0.0–0.5)
Eosinophils Relative: 1 %
HCT: 34 % — ABNORMAL LOW (ref 36.0–46.0)
Hemoglobin: 11.4 g/dL — ABNORMAL LOW (ref 12.0–15.0)
Immature Granulocytes: 0 %
Lymphocytes Relative: 15 %
Lymphs Abs: 0.8 10*3/uL (ref 0.7–4.0)
MCH: 36.3 pg — ABNORMAL HIGH (ref 26.0–34.0)
MCHC: 33.5 g/dL (ref 30.0–36.0)
MCV: 108.3 fL — ABNORMAL HIGH (ref 80.0–100.0)
Monocytes Absolute: 0.4 10*3/uL (ref 0.1–1.0)
Monocytes Relative: 8 %
Neutro Abs: 3.9 10*3/uL (ref 1.7–7.7)
Neutrophils Relative %: 75 %
Platelets: 139 10*3/uL — ABNORMAL LOW (ref 150–400)
RBC: 3.14 MIL/uL — ABNORMAL LOW (ref 3.87–5.11)
RDW: 13.1 % (ref 11.5–15.5)
WBC: 5.3 10*3/uL (ref 4.0–10.5)
nRBC: 0 % (ref 0.0–0.2)

## 2019-02-18 LAB — COMPREHENSIVE METABOLIC PANEL
ALT: 14 U/L (ref 0–44)
AST: 23 U/L (ref 15–41)
Albumin: 4.2 g/dL (ref 3.5–5.0)
Alkaline Phosphatase: 57 U/L (ref 38–126)
Anion gap: 8 (ref 5–15)
BUN: 16 mg/dL (ref 6–20)
CO2: 28 mmol/L (ref 22–32)
Calcium: 8.6 mg/dL — ABNORMAL LOW (ref 8.9–10.3)
Chloride: 105 mmol/L (ref 98–111)
Creatinine, Ser: 0.71 mg/dL (ref 0.44–1.00)
GFR calc Af Amer: >60 mL/min (ref >60)
GFR calc non Af Amer: >60 mL/min (ref >60)
Glucose, Bld: 97 mg/dL (ref 70–99)
Potassium: 3.8 mmol/L (ref 3.5–5.1)
Sodium: 141 mmol/L (ref 135–145)
Total Bilirubin: 0.5 mg/dL (ref 0.3–1.2)
Total Protein: 6.1 g/dL — ABNORMAL LOW (ref 6.5–8.1)

## 2019-02-18 MED ORDER — SODIUM CHLORIDE 0.9% FLUSH
10.0000 mL | INTRAVENOUS | Status: DC | PRN
  Administered 2019-02-18: 10 mL
  Filled 2019-02-18: qty 10

## 2019-02-18 MED ORDER — SODIUM CHLORIDE 0.9 % IV SOLN
Freq: Once | INTRAVENOUS | Status: AC
  Administered 2019-02-18: 09:00:00 via INTRAVENOUS
  Filled 2019-02-18: qty 250

## 2019-02-18 MED ORDER — HEPARIN SOD (PORK) LOCK FLUSH 100 UNIT/ML IV SOLN
500.0000 [IU] | Freq: Once | INTRAVENOUS | Status: AC | PRN
  Administered 2019-02-18: 500 [IU]
  Filled 2019-02-18: qty 5

## 2019-02-18 MED ORDER — SODIUM CHLORIDE 0.9 % IV SOLN
Freq: Once | INTRAVENOUS | Status: AC
  Administered 2019-02-18: 10:00:00 via INTRAVENOUS
  Filled 2019-02-18: qty 250

## 2019-02-18 MED ORDER — DEXTROSE 5 % IV SOLN
66.0000 mg/m2 | Freq: Once | INTRAVENOUS | Status: AC
  Administered 2019-02-18: 100 mg via INTRAVENOUS
  Filled 2019-02-18: qty 30

## 2019-02-18 MED ORDER — SODIUM CHLORIDE 0.9 % IV SOLN
20.0000 mg | Freq: Once | INTRAVENOUS | Status: AC
  Administered 2019-02-18: 20 mg via INTRAVENOUS
  Filled 2019-02-18: qty 20

## 2019-02-18 NOTE — Patient Instructions (Signed)
[facility] Discharge Instructions for Patients Receiving Chemotherapy  Today you received the following chemotherapy agents:  Kyprolis  To help prevent nausea and vomiting after your treatment, we encourage you to take your nausea medication as directed.   If you develop nausea and vomiting that is not controlled by your nausea medication, call the clinic.   BELOW ARE SYMPTOMS THAT SHOULD BE REPORTED IMMEDIATELY:  *FEVER GREATER THAN 100.5 F  *CHILLS WITH OR WITHOUT FEVER  NAUSEA AND VOMITING THAT IS NOT CONTROLLED WITH YOUR NAUSEA MEDICATION  *UNUSUAL SHORTNESS OF BREATH  *UNUSUAL BRUISING OR BLEEDING  TENDERNESS IN MOUTH AND THROAT WITH OR WITHOUT PRESENCE OF ULCERS  *URINARY PROBLEMS  *BOWEL PROBLEMS  UNUSUAL RASH Items with * indicate a potential emergency and should be followed up as soon as possible.  Feel free to call the clinic should you have any questions or concerns. The clinic phone number is [phone].  Please show the CHEMO ALERT CARD at check-in to the Emergency Department and triage nurse.   

## 2019-02-18 NOTE — Patient Instructions (Signed)

## 2019-02-21 LAB — KAPPA/LAMBDA LIGHT CHAINS
Kappa free light chain: 355.3 mg/L — ABNORMAL HIGH (ref 3.3–19.4)
Kappa, lambda light chain ratio: 177.65 — ABNORMAL HIGH (ref 0.26–1.65)
Lambda free light chains: 2 mg/L — ABNORMAL LOW (ref 5.7–26.3)

## 2019-02-23 ENCOUNTER — Telehealth: Payer: Self-pay | Admitting: Hematology and Oncology

## 2019-02-23 NOTE — Telephone Encounter (Signed)
Attempted to call Dr. Amalia Hailey from [facility] to discuss plan of care Serum light chains and noted to continue to get worse It is not clear to me whether she is a candidate for research Telephone message is left with the operator from [facility]

## 2019-02-25 ENCOUNTER — Inpatient Hospital Stay: Payer: 59

## 2019-02-25 ENCOUNTER — Inpatient Hospital Stay (HOSPITAL_BASED_OUTPATIENT_CLINIC_OR_DEPARTMENT_OTHER): Payer: 59 | Admitting: Hematology and Oncology

## 2019-02-25 ENCOUNTER — Telehealth: Payer: Self-pay

## 2019-02-25 ENCOUNTER — Other Ambulatory Visit: Payer: Self-pay | Admitting: Hematology and Oncology

## 2019-02-25 ENCOUNTER — Other Ambulatory Visit: Payer: Self-pay

## 2019-02-25 ENCOUNTER — Encounter: Payer: Self-pay | Admitting: Hematology and Oncology

## 2019-02-25 ENCOUNTER — Other Ambulatory Visit: Payer: Self-pay | Admitting: Medical

## 2019-02-25 VITALS — BP 139/77 | HR 99 | Temp 98.5°F | Resp 16 | Ht 60.0 in | Wt 115.5 lb

## 2019-02-25 DIAGNOSIS — Z7189 Other specified counseling: Secondary | ICD-10-CM | POA: Diagnosis not present

## 2019-02-25 DIAGNOSIS — C9002 Multiple myeloma in relapse: Secondary | ICD-10-CM

## 2019-02-25 DIAGNOSIS — D61810 Antineoplastic chemotherapy induced pancytopenia: Secondary | ICD-10-CM

## 2019-02-25 DIAGNOSIS — T451X5A Adverse effect of antineoplastic and immunosuppressive drugs, initial encounter: Secondary | ICD-10-CM | POA: Diagnosis not present

## 2019-02-25 LAB — MULTIPLE MYELOMA PANEL, SERUM
Albumin SerPl Elph-Mcnc: 4.4 g/dL (ref 2.9–4.4)
Albumin/Glob SerPl: 2.8 — ABNORMAL HIGH (ref 0.7–1.7)
Alpha 1: 0.3 g/dL (ref 0.0–0.4)
Alpha2 Glob SerPl Elph-Mcnc: 0.5 g/dL (ref 0.4–1.0)
B-Globulin SerPl Elph-Mcnc: 0.7 g/dL (ref 0.7–1.3)
Gamma Glob SerPl Elph-Mcnc: 0.2 g/dL — ABNORMAL LOW (ref 0.4–1.8)
Globulin, Total: 1.6 g/dL — ABNORMAL LOW (ref 2.2–3.9)
IgA: <5 mg/dL — ABNORMAL LOW (ref 87–352)
IgG (Immunoglobin G), Serum: 208 mg/dL — ABNORMAL LOW (ref 586–1602)
IgM (Immunoglobulin M), Srm: 10 mg/dL — ABNORMAL LOW (ref 26–217)
Total Protein ELP: 6 g/dL (ref 6.0–8.5)

## 2019-02-25 LAB — CBC WITH DIFFERENTIAL/PLATELET
Abs Immature Granulocytes: 0.03 10*3/uL (ref 0.00–0.07)
Basophils Absolute: 0 10*3/uL (ref 0.0–0.1)
Basophils Relative: 1 %
Eosinophils Absolute: 0 10*3/uL (ref 0.0–0.5)
Eosinophils Relative: 1 %
HCT: 31.2 % — ABNORMAL LOW (ref 36.0–46.0)
Hemoglobin: 10.5 g/dL — ABNORMAL LOW (ref 12.0–15.0)
Immature Granulocytes: 1 %
Lymphocytes Relative: 21 %
Lymphs Abs: 0.7 10*3/uL (ref 0.7–4.0)
MCH: 36.1 pg — ABNORMAL HIGH (ref 26.0–34.0)
MCHC: 33.7 g/dL (ref 30.0–36.0)
MCV: 107.2 fL — ABNORMAL HIGH (ref 80.0–100.0)
Monocytes Absolute: 0.5 10*3/uL (ref 0.1–1.0)
Monocytes Relative: 14 %
Neutro Abs: 2.2 10*3/uL (ref 1.7–7.7)
Neutrophils Relative %: 62 %
Platelets: 79 10*3/uL — ABNORMAL LOW (ref 150–400)
RBC: 2.91 MIL/uL — ABNORMAL LOW (ref 3.87–5.11)
RDW: 13 % (ref 11.5–15.5)
WBC: 3.5 10*3/uL — ABNORMAL LOW (ref 4.0–10.5)
nRBC: 0 % (ref 0.0–0.2)

## 2019-02-25 LAB — COMPREHENSIVE METABOLIC PANEL
ALT: 11 U/L (ref 0–44)
AST: 19 U/L (ref 15–41)
Albumin: 4.2 g/dL (ref 3.5–5.0)
Alkaline Phosphatase: 45 U/L (ref 38–126)
Anion gap: 11 (ref 5–15)
BUN: 14 mg/dL (ref 6–20)
CO2: 26 mmol/L (ref 22–32)
Calcium: 8.6 mg/dL — ABNORMAL LOW (ref 8.9–10.3)
Chloride: 105 mmol/L (ref 98–111)
Creatinine, Ser: 0.68 mg/dL (ref 0.44–1.00)
GFR calc Af Amer: >60 mL/min (ref >60)
GFR calc non Af Amer: >60 mL/min (ref >60)
Glucose, Bld: 95 mg/dL (ref 70–99)
Potassium: 3.6 mmol/L (ref 3.5–5.1)
Sodium: 142 mmol/L (ref 135–145)
Total Bilirubin: 0.9 mg/dL (ref 0.3–1.2)
Total Protein: 5.9 g/dL — ABNORMAL LOW (ref 6.5–8.1)

## 2019-02-25 MED ORDER — SODIUM CHLORIDE 0.9% FLUSH
10.0000 mL | INTRAVENOUS | Status: DC | PRN
  Administered 2019-02-25: 10 mL
  Filled 2019-02-25: qty 10

## 2019-02-25 MED ORDER — HEPARIN SOD (PORK) LOCK FLUSH 100 UNIT/ML IV SOLN
500.0000 [IU] | Freq: Once | INTRAVENOUS | Status: AC | PRN
  Administered 2019-02-25: 500 [IU]
  Filled 2019-02-25: qty 5

## 2019-02-25 MED ORDER — DEXTROSE 5 % IV SOLN
100.0000 mg | Freq: Once | INTRAVENOUS | Status: AC
  Administered 2019-02-25: 100 mg via INTRAVENOUS
  Filled 2019-02-25: qty 30

## 2019-02-25 MED ORDER — SODIUM CHLORIDE 0.9 % IV SOLN
20.0000 mg | Freq: Once | INTRAVENOUS | Status: AC
  Administered 2019-02-25: 20 mg via INTRAVENOUS
  Filled 2019-02-25: qty 20

## 2019-02-25 MED ORDER — SODIUM CHLORIDE 0.9 % IV SOLN
Freq: Once | INTRAVENOUS | Status: AC
  Filled 2019-02-25: qty 250

## 2019-02-25 MED ORDER — CLONAZEPAM 0.5 MG PO TABS: 0.5000 mg | ORAL_TABLET | Freq: Two times a day (BID) | ORAL | 0 refills | Status: DC | PRN

## 2019-02-25 NOTE — Assessment & Plan Note (Signed)
I have reviewed her consent protocol from [facility] I recommend the patient to proceed with consent as soon as possible and to get enrolled in clinical trial

## 2019-02-25 NOTE — Assessment & Plan Note (Signed)
She is not symptomatic We discussed the risk and benefits of delaying treatment versus proceed with treatment today and omit next week's treatment Ultimately, she agreed to proceed with treatment as scheduled today I warned her about risk of bleeding

## 2019-02-25 NOTE — Progress Notes (Signed)
Dr. Alvy Bimler in infusion for pt. visit. Per Dr. Alvy Bimler, ok to treat today with platelets = 76.

## 2019-02-25 NOTE — Progress Notes (Signed)
[place] OFFICE PROGRESS NOTE  Patient Care Team: [name], MD as PCP - General [name], MD as Consulting Physician (Oncology) [name], MD as Consulting Physician (Neurosurgery) [name], MD as Consulting Physician (Hematology and Oncology) [name], [name], MD as Referring Physician (Hematology and Oncology)  ASSESSMENT & PLAN:  Multiple myeloma in relapse [facility]) I spoke with her myeloma physician at [facility] The recommendation would be for her to continue Kyprolis despite rising light chain The patient qualify for clinical trial and have questions regarding the consent I recommend the patient to proceed with clinical trial as soon as possible and get enrolled I also recommend her to get all her tests done at [facility] to facilitate treatment Even though she is mildly pancytopenic, we will proceed with treatment today I plan to omit her treatment next week due to anticipated pancytopenia  Pancytopenia due to antineoplastic chemotherapy [facility]) She is not symptomatic We discussed the risk and benefits of delaying treatment versus proceed with treatment today and omit next week's treatment Ultimately, she agreed to proceed with treatment as scheduled today I warned her about risk of bleeding  Goals of care, counseling/discussion I have reviewed her consent protocol from [facility] I recommend the patient to proceed with consent as soon as possible and to get enrolled in clinical trial   No orders of the defined types were placed in this encounter.   INTERVAL HISTORY: Please see below for problem oriented charting. She is seen in the infusion room prior to treatment I spoke with her hematologist from [facility] to discuss plan of care She have received a consent letter from [facility] and is asking whether she should proceed to get enrolled in clinical trial She denies recent infection, fever or chills No infusion reaction The patient denies any recent signs or  symptoms of bleeding such as spontaneous epistaxis, hematuria or hematochezia. No worsening bone pain  SUMMARY OF ONCOLOGIC HISTORY: Oncology History  Multiple myeloma in relapse ([place])  07/21/2007 Bone Marrow Biopsy   Case #: [id number] Bone marrow showed myeloma   07/21/2007 Miscellaneous   She was diagnosed in May 2009. Had several cycles of RVD   10/14/2007 Bone Marrow Biopsy   Case #: [id number] repeat bone marrow biopsy showed good response to Rx   12/31/2007 Bone Marrow Transplant   She had autologous BMT at [facility]   07/19/2009 Bone Marrow Biopsy   Case #: [id number] Bone marrow biopsy showed only 1% plasma cells   10/10/2010 Bone Marrow Biopsy   [id number] Bone marrow biopsy showed 2% plasma cells   09/25/2011 Bone Marrow Biopsy   Accession #: [id number] Bone marrow only showed 4% plasma cells with ligh chain excess   10/14/2011 - 06/13/2013 Chemotherapy   She received Revlimid only, discontinued due to pancytopenia   06/18/2015 - 09/13/2015 Chemotherapy   She resumed taking Revlimid and dexamethasone. Treatment is stopped due to minimum response and pancytopenia   10/19/2015 -  Chemotherapy   The patient had Velcade for chemotherapy treatment along with weekly Daratumumab. Last dose of Velcade on 02/29/16, stopped due to progression. Pomalidomide added on 03/14/16   04/11/2016 Adverse Reaction   Delay resumption of Pomalyst cycle 2 until 04/16/16 due to neutropenia   09/16/2016 PET scan   1. No findings of active bony lymphoma. Prior activity in the sternum and thoracic spine has resolved. 2. Focal hypermetabolic activity along the sigmoid colon with some local inflammatory stranding in the adjacent mesentery, maximum SUV 11.0. This  is probably from mild acute diverticulitis. There is no hypermetabolic activity in this vicinity 4 months ago to suggest that this is from colon cancer. Correlate with any symptoms. 3. Acute right maxillary sinusitis. 4. Thoracolumbar compression  fractures, vertebral augmentation at the L1 level.   09/17/2016 Procedure   CT guided bone marrow biopsy of right posterior iliac bone with both aspirate and core samples obtained.   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 - TRILINEAGE HEMATOPOIESIS. - SEE COMMENT. PERIPHERAL BLOOD: - LYMPHOPENIA. - THROMBOCYTOPENIA. Diagnosis Note The bone marrow is slightly hypocellular for age with trilineage hematopoiesis and non-specific myeloid changes likely related to previous therapy. Plasma cells are increased in number representing 8% of all cells in the aspirate, but with lack of large aggregates or sheets in the clot and biopsy sections. Immunohistochemical stains highlight the slightly increased plasma cell component in the marrow which shows kappa light chain restriction consistent with residual/recurrent plasma cell neoplasm. Correlation with cytogenetic and FISH studies recommended   09/17/2016 Pathology Results   Normal bone marrow cytogenetics and FISH   04/06/2018 PET scan   1. No abnormal focus of increased radiotracer uptake identified to suggest active disease or progression of disease. 2. Similar appearance of thoracic and lumbar compression deformities with associated kyphosis deformity. 3. Thickening of the endometrium is again noted. This is indeterminate but is considered abnormal in a postmenopausal female. Consider further evaluation with pelvic sonogram.    12/15/2018 Echocardiogram   IMPRESSIONS      1. Left ventricular ejection fraction, by visual estimation, is 55 to 60%. The left ventricle has normal function. Normal left ventricular size. There is no left ventricular hypertrophy.  2. Global right ventricle has normal systolic function.The right ventricular size is mildly enlarged. No increase in right ventricular wall thickness.  3. Left atrial size was  normal.  4. Right atrial size was normal.  5. The mitral valve is normal in structure. No evidence of mitral valve regurgitation. No evidence of mitral stenosis.  6. The tricuspid valve is normal in structure. Tricuspid valve regurgitation is mild.  7. The aortic valve is normal in structure. Aortic valve regurgitation was not visualized by color flow Doppler. Structurally normal aortic valve, with no evidence of sclerosis or stenosis.  8. The pulmonic valve was normal in structure. Pulmonic valve regurgitation is not visualized by color flow Doppler.  9. The inferior vena cava is normal in size with greater than 50% respiratory variability, suggesting right atrial pressure of 3 mmHg. 10. The average left ventricular global longitudinal strain is 19.2 %.       REVIEW OF SYSTEMS:   Constitutional: Denies fevers, chills or abnormal weight loss Eyes: Denies blurriness of vision Ears, nose, mouth, throat, and face: Denies mucositis or sore throat Respiratory: Denies cough, dyspnea or wheezes Cardiovascular: Denies palpitation, chest discomfort or lower extremity swelling Gastrointestinal:  Denies nausea, heartburn or change in bowel habits Skin: Denies abnormal skin rashes Lymphatics: Denies new lymphadenopathy or easy bruising Neurological:Denies numbness, tingling or new weaknesses Behavioral/Psych: Mood is stable, no new changes  All other systems were reviewed with the patient and are negative.  I have reviewed the past medical history, past surgical history, social history and family history with the patient and they are unchanged from previous note.  ALLERGIES:  is allergic to hydromorphone.  MEDICATIONS:  Current Outpatient Medications  Medication Sig Dispense Refill  .  albuterol (PROVENTIL HFA;VENTOLIN HFA) 108 (90 Base) MCG/ACT inhaler Inhale 2 puffs into the lungs every 4 (four) hours as needed for wheezing or shortness of breath. 1 Inhaler 2  . aspirin 81 MG tablet Take 81 mg  by mouth.    . cholecalciferol (VITAMIN D) 1000 UNITS tablet Take 1,000 Units by mouth daily.    . Multiple Vitamins-Minerals (ONE-A-DAY 50 PLUS PO) Take by mouth daily.    . ondansetron (ZOFRAN) 8 MG tablet Take 1 tablet (8 mg total) by mouth every 8 (eight) hours as needed for nausea. 30 tablet 3  . oxycodone (OXY-IR) 5 MG capsule Take 5 mg by mouth every 4 (four) hours as needed. pain    . Oxycodone HCl (OXYCONTIN) 60 MG TB12 Take 1 tablet by mouth 2 (two) times daily.    . pantoprazole (PROTONIX) 40 MG tablet TAKE (1) TABLET DAILY AS NEEDED. 90 tablet 11  . prochlorperazine (COMPAZINE) 10 MG tablet Take 1 tablet (10 mg total) by mouth every 6 (six) hours as needed for nausea or vomiting. (Patient not taking: Reported on 02/25/2019) 30 tablet 1  . valACYclovir (VALTREX) 1000 MG tablet TAKE 1 TABLET ONCE DAILY. 30 tablet 11   No current facility-administered medications for this visit.   Facility-Administered Medications Ordered in Other Visits  Medication Dose Route Frequency Provider Last Rate Last Admin  . sodium chloride flush (NS) 0.9 % injection 10 mL  10 mL Intracatheter PRN Alvy Bimler, Patrecia Veiga, MD   10 mL at 02/25/19 1111    PHYSICAL EXAMINATION: ECOG PERFORMANCE STATUS: 1 - Symptomatic but completely ambulatory GENERAL:alert, no distress and comfortable SKIN: skin color, texture, turgor are normal, no rashes or significant lesions EYES: normal, Conjunctiva are pink and non-injected, sclera clear OROPHARYNX:no exudate, no erythema and lips, buccal mucosa, and tongue normal  NECK: supple, thyroid normal size, non-tender, without nodularity LYMPH:  no palpable lymphadenopathy in the cervical, axillary or inguinal LUNGS: clear to auscultation and percussion with normal breathing effort HEART: regular rate & rhythm and no murmurs and no lower extremity edema ABDOMEN:abdomen soft, non-tender and normal bowel sounds Musculoskeletal:no cyanosis of digits and no clubbing  NEURO: alert &  oriented x 3 with fluent speech, no focal motor/sensory deficits  LABORATORY DATA:  I have reviewed the data as listed    Component Value Date/Time   NA 142 02/25/2019 0742   NA 140 03/11/2017 1150   K 3.6 02/25/2019 0742   K 3.9 03/11/2017 1150   CL 105 02/25/2019 0742   CL 107 08/30/2012 1055   CO2 26 02/25/2019 0742   CO2 29 03/11/2017 1150   GLUCOSE 95 02/25/2019 0742   GLUCOSE 93 03/11/2017 1150   GLUCOSE 102 (H) 08/30/2012 1055   BUN 14 02/25/2019 0742   BUN 12.5 03/11/2017 1150   CREATININE 0.68 02/25/2019 0742   CREATININE 0.77 07/23/2018 0808   CREATININE 0.8 03/11/2017 1150   CALCIUM 8.6 (L) 02/25/2019 0742   CALCIUM 8.7 03/11/2017 1150   PROT 5.9 (L) 02/25/2019 0742   PROT 6.0 (L) 03/11/2017 1150   ALBUMIN 4.2 02/25/2019 0742   ALBUMIN 3.8 03/11/2017 1150   AST 19 02/25/2019 0742   AST 21 07/23/2018 0808   AST 18 03/11/2017 1150   ALT 11 02/25/2019 0742   ALT 16 07/23/2018 0808   ALT 14 03/11/2017 1150   ALKPHOS 45 02/25/2019 0742   ALKPHOS 45 03/11/2017 1150   BILITOT 0.9 02/25/2019 0742   BILITOT 0.5 07/23/2018 0808   BILITOT 0.37 03/11/2017 1150  GFRNONAA >60 02/25/2019 0742   GFRNONAA >60 07/23/2018 0808   GFRAA >60 02/25/2019 0742   GFRAA >60 07/23/2018 0808    No results found for: SPEP, UPEP  Lab Results  Component Value Date   WBC 3.5 (L) 02/25/2019   NEUTROABS 2.2 02/25/2019   HGB 10.5 (L) 02/25/2019   HCT 31.2 (L) 02/25/2019   MCV 107.2 (H) 02/25/2019   PLT 79 (L) 02/25/2019      Chemistry      Component Value Date/Time   NA 142 02/25/2019 0742   NA 140 03/11/2017 1150   K 3.6 02/25/2019 0742   K 3.9 03/11/2017 1150   CL 105 02/25/2019 0742   CL 107 08/30/2012 1055   CO2 26 02/25/2019 0742   CO2 29 03/11/2017 1150   BUN 14 02/25/2019 0742   BUN 12.5 03/11/2017 1150   CREATININE 0.68 02/25/2019 0742   CREATININE 0.77 07/23/2018 0808   CREATININE 0.8 03/11/2017 1150      Component Value Date/Time   CALCIUM 8.6 (L)  02/25/2019 0742   CALCIUM 8.7 03/11/2017 1150   ALKPHOS 45 02/25/2019 0742   ALKPHOS 45 03/11/2017 1150   AST 19 02/25/2019 0742   AST 21 07/23/2018 0808   AST 18 03/11/2017 1150   ALT 11 02/25/2019 0742   ALT 16 07/23/2018 0808   ALT 14 03/11/2017 1150   BILITOT 0.9 02/25/2019 0742   BILITOT 0.5 07/23/2018 0808   BILITOT 0.37 03/11/2017 1150      All questions were answered. The patient knows to call the clinic with any problems, questions or concerns. No barriers to learning was detected.  I spent 25 minutes counseling the patient face to face. The total time spent in the appointment was 30 minutes and more than 50% was on counseling and review of test results  [name], MD 02/25/2019 11:25 AM

## 2019-02-25 NOTE — Telephone Encounter (Signed)
Called and told Rx sent to pharmacy by Sandi Mealy, PA. She verbalized understanding.

## 2019-02-25 NOTE — Assessment & Plan Note (Signed)
I spoke with her myeloma physician at [facility] The recommendation would be for her to continue Kyprolis despite rising light chain The patient qualify for clinical trial and have questions regarding the consent I recommend the patient to proceed with clinical trial as soon as possible and get enrolled I also recommend her to get all her tests done at [facility] to facilitate treatment Even though she is mildly pancytopenic, we will proceed with treatment today I plan to omit her treatment next week due to anticipated pancytopenia

## 2019-02-25 NOTE — Telephone Encounter (Signed)
She called and left a message. She is anxious about going to [facility]. She has a 58 year old bottle of clonazepam 0.5 mg and has one left. She is asking if the office can fill Rx?

## 2019-02-25 NOTE — Patient Instructions (Signed)
[facility] Discharge Instructions for Patients Receiving Chemotherapy  Today you received the following chemotherapy agents: Carfilzomib (Kyprolis)  To help prevent nausea and vomiting after your treatment, we encourage you to take your nausea medication as directed by your MD.   If you develop nausea and vomiting that is not controlled by your nausea medication, call the clinic.   BELOW ARE SYMPTOMS THAT SHOULD BE REPORTED IMMEDIATELY:  *FEVER GREATER THAN 100.5 F  *CHILLS WITH OR WITHOUT FEVER  NAUSEA AND VOMITING THAT IS NOT CONTROLLED WITH YOUR NAUSEA MEDICATION  *UNUSUAL SHORTNESS OF BREATH  *UNUSUAL BRUISING OR BLEEDING  TENDERNESS IN MOUTH AND THROAT WITH OR WITHOUT PRESENCE OF ULCERS  *URINARY PROBLEMS  *BOWEL PROBLEMS  UNUSUAL RASH Items with * indicate a potential emergency and should be followed up as soon as possible.  Feel free to call the clinic should you have any questions or concerns. The clinic phone number is [phone].  Please show the CHEMO ALERT CARD at check-in to the Emergency Department and triage nurse.  Coronavirus (COVID-19) Are you at risk?  Are you at risk for the Coronavirus (COVID-19)?  To be considered HIGH RISK for Coronavirus (COVID-19), you have to meet the following criteria:  . Traveled to [place], [place], [place], [place] or [place]; or in the [place] to [place], [place], [place], or [place]; and have fever, cough, and shortness of breath within the last 2 weeks of travel OR . Been in close contact with a person diagnosed with COVID-19 within the last 2 weeks and have fever, cough, and shortness of breath . IF YOU DO NOT MEET THESE CRITERIA, YOU ARE CONSIDERED LOW RISK FOR COVID-19.  What to do if you are HIGH RISK for COVID-19?  . If you are having a medical emergency, call 911. . Seek medical care right away. Before you go to a doctor's office, urgent care or emergency department, call ahead and  tell them about your recent travel, contact with someone diagnosed with COVID-19, and your symptoms. You should receive instructions from your physician's office regarding next steps of care.  . When you arrive at healthcare provider, tell the healthcare staff immediately you have returned from visiting [place], [place], [place], [place] or [place]; or traveled in the [place] to [place], [place], [place], or [place]; in the last two weeks or you have been in close contact with a person diagnosed with COVID-19 in the last 2 weeks.   . Tell the health care staff about your symptoms: fever, cough and shortness of breath. . After you have been seen by a medical provider, you will be either: o Tested for (COVID-19) and discharged home on quarantine except to seek medical care if symptoms worsen, and asked to  - Stay home and avoid contact with others until you get your results (4-5 days)  - Avoid travel on public transportation if possible (such as bus, train, or airplane) or o Sent to the Emergency Department by EMS for evaluation, COVID-19 testing, and possible admission depending on your condition and test results.  What to do if you are LOW RISK for COVID-19?  Reduce your risk of any infection by using the same precautions used for avoiding the common cold or flu:  . Wash your hands often with soap and warm water for at least 20 seconds.  If soap and water are not readily available, use an alcohol-based hand sanitizer with at least 60% alcohol.  .   If coughing or sneezing, cover your mouth and nose by coughing or sneezing into the elbow areas of your shirt or coat, into a tissue or into your sleeve (not your hands). . Avoid shaking hands with others and consider head nods or verbal greetings only. . Avoid touching your eyes, nose, or mouth with unwashed hands.  . Avoid close contact with people who are sick. . Avoid places or events with large numbers of people in one location, like  concerts or sporting events. . Carefully consider travel plans you have or are making. . If you are planning any travel outside or inside the [place], visit the CDC's Travelers' Health webpage for the latest health notices. . If you have some symptoms but not all symptoms, continue to monitor at home and seek medical attention if your symptoms worsen. . If you are having a medical emergency, call 911.   ADDITIONAL HEALTHCARE OPTIONS FOR PATIENTS  [facility] / e-Visit: [email or web address]         [facility]: [phone]  [facility]: [phone]                   [facility]: [phone]  

## 2019-03-03 ENCOUNTER — Other Ambulatory Visit: Payer: 59

## 2019-03-03 ENCOUNTER — Ambulatory Visit: Payer: 59

## 2019-03-18 ENCOUNTER — Inpatient Hospital Stay: Payer: 59

## 2019-03-18 ENCOUNTER — Other Ambulatory Visit: Payer: Self-pay

## 2019-03-18 ENCOUNTER — Inpatient Hospital Stay: Payer: 59 | Attending: Hematology and Oncology

## 2019-03-18 VITALS — BP 125/80 | HR 90 | Temp 98.3°F | Resp 16

## 2019-03-18 DIAGNOSIS — Z79899 Other long term (current) drug therapy: Secondary | ICD-10-CM | POA: Insufficient documentation

## 2019-03-18 DIAGNOSIS — Z95828 Presence of other vascular implants and grafts: Secondary | ICD-10-CM

## 2019-03-18 DIAGNOSIS — C9002 Multiple myeloma in relapse: Secondary | ICD-10-CM

## 2019-03-18 DIAGNOSIS — Z5111 Encounter for antineoplastic chemotherapy: Secondary | ICD-10-CM | POA: Insufficient documentation

## 2019-03-18 LAB — COMPREHENSIVE METABOLIC PANEL
ALT: 9 U/L (ref 0–44)
AST: 18 U/L (ref 15–41)
Albumin: 4.2 g/dL (ref 3.5–5.0)
Alkaline Phosphatase: 55 U/L (ref 38–126)
Anion gap: 10 (ref 5–15)
BUN: 16 mg/dL (ref 6–20)
CO2: 27 mmol/L (ref 22–32)
Calcium: 8.4 mg/dL — ABNORMAL LOW (ref 8.9–10.3)
Chloride: 105 mmol/L (ref 98–111)
Creatinine, Ser: 0.69 mg/dL (ref 0.44–1.00)
GFR calc Af Amer: >60 mL/min (ref >60)
GFR calc non Af Amer: >60 mL/min (ref >60)
Glucose, Bld: 93 mg/dL (ref 70–99)
Potassium: 3.5 mmol/L (ref 3.5–5.1)
Sodium: 142 mmol/L (ref 135–145)
Total Bilirubin: 0.4 mg/dL (ref 0.3–1.2)
Total Protein: 5.7 g/dL — ABNORMAL LOW (ref 6.5–8.1)

## 2019-03-18 LAB — CBC WITH DIFFERENTIAL/PLATELET
Abs Immature Granulocytes: 0.01 10*3/uL (ref 0.00–0.07)
Basophils Absolute: 0 10*3/uL (ref 0.0–0.1)
Basophils Relative: 1 %
Eosinophils Absolute: 0.1 10*3/uL (ref 0.0–0.5)
Eosinophils Relative: 1 %
HCT: 32 % — ABNORMAL LOW (ref 36.0–46.0)
Hemoglobin: 10.9 g/dL — ABNORMAL LOW (ref 12.0–15.0)
Immature Granulocytes: 0 %
Lymphocytes Relative: 16 %
Lymphs Abs: 0.7 10*3/uL (ref 0.7–4.0)
MCH: 37.6 pg — ABNORMAL HIGH (ref 26.0–34.0)
MCHC: 34.1 g/dL (ref 30.0–36.0)
MCV: 110.3 fL — ABNORMAL HIGH (ref 80.0–100.0)
Monocytes Absolute: 0.4 10*3/uL (ref 0.1–1.0)
Monocytes Relative: 10 %
Neutro Abs: 3.2 10*3/uL (ref 1.7–7.7)
Neutrophils Relative %: 72 %
Platelets: 127 10*3/uL — ABNORMAL LOW (ref 150–400)
RBC: 2.9 MIL/uL — ABNORMAL LOW (ref 3.87–5.11)
RDW: 11.8 % (ref 11.5–15.5)
WBC: 4.4 10*3/uL (ref 4.0–10.5)
nRBC: 0 % (ref 0.0–0.2)

## 2019-03-18 MED ORDER — HEPARIN SOD (PORK) LOCK FLUSH 100 UNIT/ML IV SOLN
500.0000 [IU] | Freq: Once | INTRAVENOUS | Status: AC
  Administered 2019-03-18: 500 [IU] via INTRAVENOUS
  Filled 2019-03-18: qty 5

## 2019-03-18 MED ORDER — ZOLEDRONIC ACID 4 MG/100ML IV SOLN
INTRAVENOUS | Status: AC
  Filled 2019-03-18: qty 100

## 2019-03-18 MED ORDER — ZOLEDRONIC ACID 4 MG/100ML IV SOLN
4.0000 mg | Freq: Once | INTRAVENOUS | Status: AC
  Administered 2019-03-18: 4 mg via INTRAVENOUS

## 2019-03-18 MED ORDER — SODIUM CHLORIDE 0.9 % IV SOLN
Freq: Once | INTRAVENOUS | Status: AC
  Filled 2019-03-18: qty 250

## 2019-03-18 MED ORDER — SODIUM CHLORIDE 0.9% FLUSH
10.0000 mL | Freq: Once | INTRAVENOUS | Status: AC
  Administered 2019-03-18: 11:00:00 10 mL via INTRAVENOUS
  Filled 2019-03-18: qty 10

## 2019-03-18 MED ORDER — DEXTROSE 5 % IV SOLN
66.0000 mg/m2 | Freq: Once | INTRAVENOUS | Status: AC
  Administered 2019-03-18: 100 mg via INTRAVENOUS
  Filled 2019-03-18: qty 30

## 2019-03-18 MED ORDER — ZOLEDRONIC ACID 4 MG/5ML IV CONC: 4.0000 mg | Freq: Once | INTRAVENOUS | Status: DC

## 2019-03-18 MED ORDER — SODIUM CHLORIDE 0.9 % IV SOLN
20.0000 mg | Freq: Once | INTRAVENOUS | Status: AC
  Administered 2019-03-18: 20 mg via INTRAVENOUS
  Filled 2019-03-18: qty 20

## 2019-03-18 NOTE — Patient Instructions (Signed)
[facility] Discharge Instructions for Patients Receiving Chemotherapy  Today you received the following chemotherapy agents: Carfilzomib (Kyprolis)  To help prevent nausea and vomiting after your treatment, we encourage you to take your nausea medication as directed by your MD.   If you develop nausea and vomiting that is not controlled by your nausea medication, call the clinic.   BELOW ARE SYMPTOMS THAT SHOULD BE REPORTED IMMEDIATELY:  *FEVER GREATER THAN 100.5 F  *CHILLS WITH OR WITHOUT FEVER  NAUSEA AND VOMITING THAT IS NOT CONTROLLED WITH YOUR NAUSEA MEDICATION  *UNUSUAL SHORTNESS OF BREATH  *UNUSUAL BRUISING OR BLEEDING  TENDERNESS IN MOUTH AND THROAT WITH OR WITHOUT PRESENCE OF ULCERS  *URINARY PROBLEMS  *BOWEL PROBLEMS  UNUSUAL RASH Items with * indicate a potential emergency and should be followed up as soon as possible.  Feel free to call the clinic should you have any questions or concerns. The clinic phone number is [phone].  Please show the [facility] at check-in to the Emergency Department and triage nurse. Coronavirus (COVID-19) Are you at risk?  Are you at risk for the Coronavirus (COVID-19)?  To be considered HIGH RISK for Coronavirus (COVID-19), you have to meet the following criteria:  . Traveled to [place], [place], [place], [place] or [place]; or in the [place] to [place], [place], [place], or [place]; and have fever, cough, and shortness of breath within the last 2 weeks of travel OR . Been in close contact with a person diagnosed with COVID-19 within the last 2 weeks and have fever, cough, and shortness of breath . IF YOU DO NOT MEET THESE CRITERIA, YOU ARE CONSIDERED LOW RISK FOR COVID-19.  What to do if you are HIGH RISK for COVID-19?  Marland Kitchen If you are having a medical emergency, call 911. . Seek medical care right away. Before you go to a doctor's office, urgent care or emergency department, call ahead and  tell them about your recent travel, contact with someone diagnosed with COVID-19, and your symptoms. You should receive instructions from your physician's office regarding next steps of care.  . When you arrive at healthcare provider, tell the healthcare staff immediately you have returned from visiting [place], [place], [place], [place] or [place]; or traveled in the [place] to [place], [place], [place], or [place]; in the last two weeks or you have been in close contact with a person diagnosed with COVID-19 in the last 2 weeks.   . Tell the health care staff about your symptoms: fever, cough and shortness of breath. . After you have been seen by a medical provider, you will be either: o Tested for (COVID-19) and discharged home on quarantine except to seek medical care if symptoms worsen, and asked to  - Stay home and avoid contact with others until you get your results (4-5 days)  - Avoid travel on public transportation if possible (such as bus, train, or airplane) or o Sent to the Emergency Department by EMS for evaluation, COVID-19 testing, and possible admission depending on your condition and test results.  What to do if you are LOW RISK for COVID-19?  Reduce your risk of any infection by using the same precautions used for avoiding the common cold or flu:  Marland Kitchen Wash your hands often with soap and warm water for at least 20 seconds.  If soap and water are not readily available, use an alcohol-based hand sanitizer with at least 60% alcohol.  . If  coughing or sneezing, cover your mouth and nose by coughing or sneezing into the elbow areas of your shirt or coat, into a tissue or into your sleeve (not your hands). . Avoid shaking hands with others and consider head nods or verbal greetings only. . Avoid touching your eyes, nose, or mouth with unwashed hands.  . Avoid close contact with people who are sick. . Avoid places or events with large numbers of people in one location, like  concerts or sporting events. . Carefully consider travel plans you have or are making. . If you are planning any travel outside or inside the [place], visit the CDC's Travelers' Health webpage for the latest health notices. . If you have some symptoms but not all symptoms, continue to monitor at home and seek medical attention if your symptoms worsen. . If you are having a medical emergency, call 911.   [place] / e-Visit: [email or web address]         [facility]: [phone]  [facility]: [id number]                   [facility]: [id number]    Zoledronic Acid injection (Hypercalcemia, Oncology) What is this medicine? ZOLEDRONIC ACID (ZOE le dron ik AS id) lowers the amount of calcium loss from bone. It is used to treat too much calcium in your blood from cancer. It is also used to prevent complications of cancer that has spread to the bone. This medicine may be used for other purposes; ask your health care provider or pharmacist if you have questions. COMMON BRAND NAME(S): Zometa What should I tell my health care provider before I take this medicine? They need to know if you have any of these conditions:  aspirin-sensitive asthma  cancer, especially if you are receiving medicines used to treat cancer  dental disease or wear dentures  infection  kidney disease  receiving corticosteroids like dexamethasone or prednisone  an unusual or allergic reaction to zoledronic acid, other medicines, foods, dyes, or preservatives  pregnant or trying to get pregnant  breast-feeding How should I use this medicine? This medicine is for infusion into a vein. It is given by a health care professional in a hospital or clinic setting. Talk to your pediatrician regarding the use of this medicine in children. Special care may be needed. Overdosage: If you  think you have taken too much of this medicine contact a poison control center or emergency room at once. NOTE: This medicine is only for you. Do not share this medicine with others. What if I miss a dose? It is important not to miss your dose. Call your doctor or health care professional if you are unable to keep an appointment. What may interact with this medicine?  certain antibiotics given by injection  NSAIDs, medicines for pain and inflammation, like ibuprofen or naproxen  some diuretics like bumetanide, furosemide  teriparatide  thalidomide This list may not describe all possible interactions. Give your health care provider a list of all the medicines, herbs, non-prescription drugs, or dietary supplements you use. Also tell them if you smoke, drink alcohol, or use illegal drugs. Some items may interact with your medicine. What should I watch for while using this medicine? Visit your doctor or health care professional for regular checkups. It may be some time before you see the benefit from this medicine. Do not stop taking your medicine unless your doctor tells you  to. Your doctor may order blood tests or other tests to see how you are doing. Women should inform their doctor if they wish to become pregnant or think they might be pregnant. There is a potential for serious side effects to an unborn child. Talk to your health care professional or pharmacist for more information. You should make sure that you get enough calcium and vitamin D while you are taking this medicine. Discuss the foods you eat and the vitamins you take with your health care professional. Some people who take this medicine have severe bone, joint, and/or muscle pain. This medicine may also increase your risk for jaw problems or a broken thigh bone. Tell your doctor right away if you have severe pain in your jaw, bones, joints, or muscles. Tell your doctor if you have any pain that does not go away or that gets  worse. Tell your dentist and dental surgeon that you are taking this medicine. You should not have major dental surgery while on this medicine. See your dentist to have a dental exam and fix any dental problems before starting this medicine. Take good care of your teeth while on this medicine. Make sure you see your dentist for regular follow-up appointments. What side effects may I notice from receiving this medicine? Side effects that you should report to your doctor or health care professional as soon as possible:  allergic reactions like skin rash, itching or hives, swelling of the face, lips, or tongue  anxiety, confusion, or depression  breathing problems  changes in vision  eye pain  feeling faint or lightheaded, falls  jaw pain, especially after dental work  mouth sores  muscle cramps, stiffness, or weakness  redness, blistering, peeling or loosening of the skin, including inside the mouth  trouble passing urine or change in the amount of urine Side effects that usually do not require medical attention (report to your doctor or health care professional if they continue or are bothersome):  bone, joint, or muscle pain  constipation  diarrhea  fever  hair loss  irritation at site where injected  loss of appetite  nausea, vomiting  stomach upset  trouble sleeping  trouble swallowing  weak or tired This list may not describe all possible side effects. Call your doctor for medical advice about side effects. You may report side effects to FDA at 1-800-FDA-1088. Where should I keep my medicine? This drug is given in a hospital or clinic and will not be stored at home. NOTE: This sheet is a summary. It may not cover all possible information. If you have questions about this medicine, talk to your doctor, pharmacist, or health care provider.  2020 Elsevier/Gold Standard (2013-07-23 14:19:39)

## 2019-03-18 NOTE — Patient Instructions (Signed)

## 2019-03-18 NOTE — Progress Notes (Signed)
Spoke w/ Dr. Alvy Bimler, we will proceed w/ zometa today. Corrected calcium is 8.4 and patient is taking calcium and vitamin supplementation at home. [facility] has recommended she continue the zometa in addition to her other treatments here.   Demetrius Charity, PharmD, [facility] Pharmacist Pharmacy Phone: [phone] 03/18/2019

## 2019-03-21 LAB — KAPPA/LAMBDA LIGHT CHAINS
Kappa free light chain: 369.6 mg/L — ABNORMAL HIGH (ref 3.3–19.4)
Kappa, lambda light chain ratio: 217.41 — ABNORMAL HIGH (ref 0.26–1.65)
Lambda free light chains: 1.7 mg/L — ABNORMAL LOW (ref 5.7–26.3)

## 2019-03-23 LAB — MULTIPLE MYELOMA PANEL, SERUM
Albumin SerPl Elph-Mcnc: 3.8 g/dL (ref 2.9–4.4)
Albumin/Glob SerPl: 2.4 — ABNORMAL HIGH (ref 0.7–1.7)
Alpha 1: 0.3 g/dL (ref 0.0–0.4)
Alpha2 Glob SerPl Elph-Mcnc: 0.5 g/dL (ref 0.4–1.0)
B-Globulin SerPl Elph-Mcnc: 0.6 g/dL — ABNORMAL LOW (ref 0.7–1.3)
Gamma Glob SerPl Elph-Mcnc: 0.2 g/dL — ABNORMAL LOW (ref 0.4–1.8)
Globulin, Total: 1.6 g/dL — ABNORMAL LOW (ref 2.2–3.9)
IgA: <5 mg/dL — ABNORMAL LOW (ref 87–352)
IgG (Immunoglobin G), Serum: 172 mg/dL — ABNORMAL LOW (ref 586–1602)
IgM (Immunoglobulin M), Srm: 6 mg/dL — ABNORMAL LOW (ref 26–217)
Total Protein ELP: 5.4 g/dL — ABNORMAL LOW (ref 6.0–8.5)

## 2019-03-25 ENCOUNTER — Inpatient Hospital Stay: Payer: 59

## 2019-03-25 ENCOUNTER — Other Ambulatory Visit: Payer: Self-pay

## 2019-03-25 ENCOUNTER — Inpatient Hospital Stay (HOSPITAL_BASED_OUTPATIENT_CLINIC_OR_DEPARTMENT_OTHER): Payer: 59 | Admitting: Hematology and Oncology

## 2019-03-25 ENCOUNTER — Encounter: Payer: Self-pay | Admitting: Hematology and Oncology

## 2019-03-25 VITALS — BP 143/94 | HR 84 | Temp 97.8°F | Resp 16

## 2019-03-25 DIAGNOSIS — C9002 Multiple myeloma in relapse: Secondary | ICD-10-CM

## 2019-03-25 DIAGNOSIS — T451X5A Adverse effect of antineoplastic and immunosuppressive drugs, initial encounter: Secondary | ICD-10-CM

## 2019-03-25 DIAGNOSIS — D61810 Antineoplastic chemotherapy induced pancytopenia: Secondary | ICD-10-CM

## 2019-03-25 DIAGNOSIS — R64 Cachexia: Secondary | ICD-10-CM

## 2019-03-25 LAB — CBC WITH DIFFERENTIAL/PLATELET
Abs Immature Granulocytes: 0 10*3/uL (ref 0.00–0.07)
Basophils Absolute: 0 10*3/uL (ref 0.0–0.1)
Basophils Relative: 1 %
Eosinophils Absolute: 0.1 10*3/uL (ref 0.0–0.5)
Eosinophils Relative: 1 %
HCT: 31.6 % — ABNORMAL LOW (ref 36.0–46.0)
Hemoglobin: 10.5 g/dL — ABNORMAL LOW (ref 12.0–15.0)
Immature Granulocytes: 0 %
Lymphocytes Relative: 19 %
Lymphs Abs: 0.7 10*3/uL (ref 0.7–4.0)
MCH: 37 pg — ABNORMAL HIGH (ref 26.0–34.0)
MCHC: 33.2 g/dL (ref 30.0–36.0)
MCV: 111.3 fL — ABNORMAL HIGH (ref 80.0–100.0)
Monocytes Absolute: 0.5 10*3/uL (ref 0.1–1.0)
Monocytes Relative: 14 %
Neutro Abs: 2.3 10*3/uL (ref 1.7–7.7)
Neutrophils Relative %: 65 %
Platelets: 81 10*3/uL — ABNORMAL LOW (ref 150–400)
RBC: 2.84 MIL/uL — ABNORMAL LOW (ref 3.87–5.11)
RDW: 11.9 % (ref 11.5–15.5)
WBC: 3.6 10*3/uL — ABNORMAL LOW (ref 4.0–10.5)
nRBC: 0 % (ref 0.0–0.2)

## 2019-03-25 LAB — COMPREHENSIVE METABOLIC PANEL
ALT: 11 U/L (ref 0–44)
AST: 19 U/L (ref 15–41)
Albumin: 4.1 g/dL (ref 3.5–5.0)
Alkaline Phosphatase: 43 U/L (ref 38–126)
Anion gap: 10 (ref 5–15)
BUN: 13 mg/dL (ref 6–20)
CO2: 26 mmol/L (ref 22–32)
Calcium: 8.4 mg/dL — ABNORMAL LOW (ref 8.9–10.3)
Chloride: 107 mmol/L (ref 98–111)
Creatinine, Ser: 0.67 mg/dL (ref 0.44–1.00)
GFR calc Af Amer: >60 mL/min (ref >60)
GFR calc non Af Amer: >60 mL/min (ref >60)
Glucose, Bld: 92 mg/dL (ref 70–99)
Potassium: 3.9 mmol/L (ref 3.5–5.1)
Sodium: 143 mmol/L (ref 135–145)
Total Bilirubin: 0.6 mg/dL (ref 0.3–1.2)
Total Protein: 5.7 g/dL — ABNORMAL LOW (ref 6.5–8.1)

## 2019-03-25 MED ORDER — SODIUM CHLORIDE 0.9 % IV SOLN
Freq: Once | INTRAVENOUS | Status: AC
  Filled 2019-03-25: qty 250

## 2019-03-25 MED ORDER — SODIUM CHLORIDE 0.9% FLUSH
10.0000 mL | INTRAVENOUS | Status: DC | PRN
  Administered 2019-03-25: 10 mL
  Filled 2019-03-25: qty 10

## 2019-03-25 MED ORDER — DEXTROSE 5 % IV SOLN
66.0000 mg/m2 | Freq: Once | INTRAVENOUS | Status: AC
  Administered 2019-03-25: 100 mg via INTRAVENOUS
  Filled 2019-03-25: qty 30

## 2019-03-25 MED ORDER — SODIUM CHLORIDE 0.9 % IV SOLN
20.0000 mg | Freq: Once | INTRAVENOUS | Status: AC
  Administered 2019-03-25: 20 mg via INTRAVENOUS
  Filled 2019-03-25: qty 20

## 2019-03-25 MED ORDER — HEPARIN SOD (PORK) LOCK FLUSH 100 UNIT/ML IV SOLN
500.0000 [IU] | Freq: Once | INTRAVENOUS | Status: AC | PRN
  Administered 2019-03-25: 500 [IU]
  Filled 2019-03-25: qty 5

## 2019-03-25 NOTE — Patient Instructions (Signed)
COVID-19 Vaccine Information can be found at: [email or web address] For questions related to vaccine distribution or appointments, please email [email or web address] or call [phone].    [facility] Discharge Instructions for Patients Receiving Chemotherapy  Today you received the following chemotherapy agents:  carfilzomib (Kyprolis)  To help prevent nausea and vomiting after your treatment, we encourage you to take your nausea medication as prescribed.   If you develop nausea and vomiting that is not controlled by your nausea medication, call the clinic.   BELOW ARE SYMPTOMS THAT SHOULD BE REPORTED IMMEDIATELY:  *FEVER GREATER THAN 100.5 F  *CHILLS WITH OR WITHOUT FEVER  NAUSEA AND VOMITING THAT IS NOT CONTROLLED WITH YOUR NAUSEA MEDICATION  *UNUSUAL SHORTNESS OF BREATH  *UNUSUAL BRUISING OR BLEEDING  TENDERNESS IN MOUTH AND THROAT WITH OR WITHOUT PRESENCE OF ULCERS  *URINARY PROBLEMS  *BOWEL PROBLEMS  UNUSUAL RASH Items with * indicate a potential emergency and should be followed up as soon as possible.  Feel free to call the clinic should you have any questions or concerns. The clinic phone number is [phone].  Please show the [facility] at check-in to the Emergency Department and triage nurse.

## 2019-03-25 NOTE — Patient Instructions (Signed)

## 2019-03-25 NOTE — Assessment & Plan Note (Signed)
She have reduced oral intake due to loss of appetite She has lost a few pounds of weight We discussed the importance of adequate hydration in anticipation for future car T-cell treatment I encouraged her to increase oral intake and supplement as tolerated

## 2019-03-25 NOTE — Progress Notes (Signed)
[facility] OFFICE PROGRESS NOTE  Patient Care Team: [name], MD as PCP - General [name], MD as Consulting Physician (Oncology) [name], MD as Consulting Physician (Neurosurgery) [name], MD as Consulting Physician (Hematology and Oncology) [name], [name], MD as Referring Physician (Hematology and Oncology)  ASSESSMENT & PLAN:  Multiple myeloma in relapse [facility]) I spoke with her myeloma physician at [facility] The recommendation would be for her to continue Kyprolis despite rising light chain.  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   All questions were answered. The patient knows to call the clinic with any problems, questions or concerns. The total time spent in the appointment was 20 minutes encounter with patients including review of chart and various tests results, discussions about plan of care and coordination of care plan   [name], MD 03/25/2019 2:15 PM  INTERVAL HISTORY: Please see below for problem oriented charting. She returns for treatment and follow-up She was seen in the infusion room She complained of fatigue and reduced appetite She has lost about 3 pounds since last time I saw her a month ago No  worsening bone pain No infusion reaction, fever or chills The patient denies any recent signs or symptoms of bleeding such as spontaneous epistaxis, hematuria or hematochezia.   SUMMARY OF ONCOLOGIC HISTORY: Oncology History  Multiple myeloma in relapse ([place])  07/21/2007 Bone Marrow Biopsy   Case #: [id number] Bone marrow showed myeloma   07/21/2007 Miscellaneous   She was diagnosed in May 2009. 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 Treatment is stopped due to minimum response and pancytopenia   10/19/2015 -  Chemotherapy   The patient had Velcade for chemotherapy treatment along with weekly Daratumumab. Last dose of Velcade on 02/29/16, stopped due to progression. Pomalidomide added on 03/14/16   04/11/2016 Adverse Reaction   Delay resumption of Pomalyst cycle 2 until 04/16/16 due to neutropenia   09/16/2016 PET scan   1. No findings of active bony lymphoma. Prior activity in the sternum and thoracic spine has resolved. 2. Focal  hypermetabolic activity along the sigmoid colon with some local inflammatory stranding in the adjacent mesentery, maximum SUV 11.0. This is probably from mild acute diverticulitis. There is no hypermetabolic activity in this vicinity 4 months ago to suggest that this is from colon cancer. Correlate with any  symptoms. 3. Acute right maxillary sinusitis. 4. Thoracolumbar compression fractures, vertebral augmentation at the L1 level.   09/17/2016 Procedure   CT guided bone marrow biopsy of right posterior iliac bone with both aspirate and core samples obtained.   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 - TRILINEAGE HEMATOPOIESIS. - SEE COMMENT. PERIPHERAL BLOOD: - LYMPHOPENIA. - THROMBOCYTOPENIA. Diagnosis Note The bone marrow is slightly hypocellular for age with trilineage hematopoiesis and non-specific myeloid changes likely related to previous therapy. Plasma cells are increased in number representing 8% of all cells in the aspirate, but with lack of large aggregates or sheets in the clot and biopsy sections. Immunohistochemical stains highlight the slightly increased plasma cell component in the marrow which shows kappa light chain restriction consistent with residual/recurrent plasma cell neoplasm. Correlation with cytogenetic and FISH studies recommended   09/17/2016 Pathology Results   Normal bone marrow cytogenetics and FISH   04/06/2018 PET scan   1. No abnormal focus of increased radiotracer uptake identified to suggest active disease or progression of disease. 2. Similar appearance of thoracic and lumbar compression deformities with associated kyphosis deformity. 3. Thickening of the endometrium is again noted. This is indeterminate but is considered abnormal in a postmenopausal female. Consider further evaluation with pelvic sonogram.    12/15/2018 Echocardiogram   IMPRESSIONS      1. Left ventricular ejection fraction, by visual estimation, is 55 to 60%. The left ventricle has normal function. Normal left ventricular size. There is no left ventricular hypertrophy.  2. Global right ventricle has normal systolic function.The right ventricular size is mildly enlarged.  No increase in right ventricular wall thickness.  3. Left atrial size was normal.  4. Right atrial size was normal.  5. The mitral valve is normal in structure. No evidence of mitral valve regurgitation. No evidence of mitral stenosis.  6. The tricuspid valve is normal in structure. Tricuspid valve regurgitation is mild.  7. The aortic valve is normal in structure. Aortic valve regurgitation was not visualized by color flow Doppler. Structurally normal aortic valve, with no evidence of sclerosis or stenosis.  8. The pulmonic valve was normal in structure. Pulmonic valve regurgitation is not visualized by color flow Doppler.  9. The inferior vena cava is normal in size with greater than 50% respiratory variability, suggesting right atrial pressure of 3 mmHg. 10. The average left ventricular global longitudinal strain is 19.2 %.       REVIEW OF SYSTEMS:    Eyes: Denies blurriness of vision Ears, nose, mouth, throat, and face: Denies mucositis or sore throat Respiratory: Denies cough, dyspnea or wheezes Cardiovascular: Denies palpitation, chest discomfort or lower extremity swelling Gastrointestinal:  Denies nausea, heartburn or change in bowel habits Skin: Denies abnormal skin rashes Lymphatics: Denies new lymphadenopathy or easy bruising Neurological:Denies numbness, tingling or new weaknesses Behavioral/Psych: Mood is stable, no new changes  All other systems were reviewed with the patient and are negative.  I have reviewed the past medical history, past surgical history, social history and family history with the patient and they are unchanged from previous note.  ALLERGIES:  is allergic to hydromorphone.  MEDICATIONS:  Current Outpatient Medications  Medication Sig Dispense Refill  . aspirin 81 MG tablet Take 81 mg by mouth.    . cholecalciferol (VITAMIN D) 1000 UNITS tablet Take 1,000 Units by mouth daily.    . clonazePAM (KLONOPIN) 0.5 MG tablet Take 1 tablet (0.5 mg total) by  mouth 2 (two) times daily as needed for anxiety. 30 tablet 0  . Multiple Vitamins-Minerals (ONE-A-DAY 50 PLUS PO) Take by mouth daily.    . ondansetron (ZOFRAN) 8 MG tablet Take 1 tablet (8 mg total) by mouth every 8 (eight) hours as needed for nausea. 30 tablet 3  . oxycodone (OXY-IR) 5 MG capsule Take 5 mg by mouth every 4 (four) hours as needed. pain    . Oxycodone HCl (OXYCONTIN) 60 MG TB12 Take 1 tablet by mouth 2 (two) times daily.    . pantoprazole (PROTONIX) 40 MG tablet TAKE (1) TABLET DAILY AS NEEDED. 90 tablet 11  . prochlorperazine (COMPAZINE) 10 MG tablet Take 1 tablet (10 mg total) by mouth every 6 (six) hours as needed for nausea or vomiting. (Patient not taking: Reported on 02/25/2019) 30 tablet 1  . valACYclovir (VALTREX) 1000 MG tablet TAKE 1 TABLET ONCE DAILY. 30 tablet 11   No current facility-administered medications for this visit.    PHYSICAL EXAMINATION: ECOG PERFORMANCE STATUS: 1 - Symptomatic but completely ambulatory GENERAL:alert, no distress and comfortable SKIN: skin color, texture, turgor are normal, no rashes or significant lesions EYES: normal, Conjunctiva are pink and non-injected, sclera clear OROPHARYNX:no exudate, no erythema and lips, buccal mucosa, and tongue normal  NECK: supple, thyroid normal size, non-tender, without nodularity LYMPH:  no palpable lymphadenopathy in the cervical, axillary or inguinal LUNGS: clear to auscultation and percussion with normal breathing effort HEART: regular rate & rhythm and no murmurs and no lower extremity edema ABDOMEN:abdomen soft, non-tender and normal bowel sounds Musculoskeletal:no cyanosis of digits and no clubbing  NEURO: alert & oriented x 3 with fluent speech, no focal motor/sensory deficits  LABORATORY DATA:  I have reviewed the data as listed    Component Value Date/Time   NA 143 03/25/2019 0740   NA 140 03/11/2017 1150   K 3.9 03/25/2019 0740   K 3.9 03/11/2017 1150   CL 107 03/25/2019 0740   CL  107 08/30/2012 1055   CO2 26 03/25/2019 0740   CO2 29 03/11/2017 1150   GLUCOSE 92 03/25/2019 0740   GLUCOSE 93 03/11/2017 1150   GLUCOSE 102 (H) 08/30/2012 1055   BUN 13 03/25/2019 0740   BUN 12.5 03/11/2017 1150   CREATININE 0.67 03/25/2019 0740   CREATININE 0.77 07/23/2018 0808   CREATININE 0.8 03/11/2017 1150   CALCIUM 8.4 (L) 03/25/2019 0740   CALCIUM 8.7 03/11/2017 1150   PROT 5.7 (L) 03/25/2019 0740   PROT 6.0 (L) 03/11/2017 1150   ALBUMIN 4.1 03/25/2019 0740   ALBUMIN 3.8 03/11/2017 1150   AST 19 03/25/2019 0740   AST 21 07/23/2018 0808   AST 18 03/11/2017 1150   ALT 11 03/25/2019 0740   ALT 16 07/23/2018 0808   ALT 14 03/11/2017 1150   ALKPHOS 43 03/25/2019 0740   ALKPHOS 45 03/11/2017 1150   BILITOT 0.6 03/25/2019 0740   BILITOT 0.5 07/23/2018 0808   BILITOT 0.37 03/11/2017 1150   GFRNONAA >60 03/25/2019 0740   GFRNONAA >60 07/23/2018 0808   GFRAA >60 03/25/2019 0740   GFRAA >60 07/23/2018 0808    No results found for: SPEP, UPEP  Lab Results  Component  Value Date   WBC 3.6 (L) 03/25/2019   NEUTROABS 2.3 03/25/2019   HGB 10.5 (L) 03/25/2019   HCT 31.6 (L) 03/25/2019   MCV 111.3 (H) 03/25/2019   PLT 81 (L) 03/25/2019      Chemistry      Component Value Date/Time   NA 143 03/25/2019 0740   NA 140 03/11/2017 1150   K 3.9 03/25/2019 0740   K 3.9 03/11/2017 1150   CL 107 03/25/2019 0740   CL 107 08/30/2012 1055   CO2 26 03/25/2019 0740   CO2 29 03/11/2017 1150   BUN 13 03/25/2019 0740   BUN 12.5 03/11/2017 1150   CREATININE 0.67 03/25/2019 0740   CREATININE 0.77 07/23/2018 0808   CREATININE 0.8 03/11/2017 1150      Component Value Date/Time   CALCIUM 8.4 (L) 03/25/2019 0740   CALCIUM 8.7 03/11/2017 1150   ALKPHOS 43 03/25/2019 0740   ALKPHOS 45 03/11/2017 1150   AST 19 03/25/2019 0740   AST 21 07/23/2018 0808   AST 18 03/11/2017 1150   ALT 11 03/25/2019 0740   ALT 16 07/23/2018 0808   ALT 14 03/11/2017 1150   BILITOT 0.6 03/25/2019 0740    BILITOT 0.5 07/23/2018 0808   BILITOT 0.37 03/11/2017 1150

## 2019-03-25 NOTE — Assessment & Plan Note (Signed)
I spoke with her myeloma physician at [facility] The recommendation would be for her to continue Kyprolis despite rising light chain.  Her last dose of treatment is today and then she will proceed for further treatment at [facility] Even though she is mildly pancytopenic, we will proceed with treatment today I will be on standby to provide supportive care in the future if needed

## 2019-03-25 NOTE — Assessment & Plan Note (Signed)
She is not symptomatic Ultimately, she agreed to proceed with treatment as scheduled today I warned her about risk of bleeding

## 2019-04-01 ENCOUNTER — Ambulatory Visit: Payer: 59

## 2019-04-01 ENCOUNTER — Other Ambulatory Visit: Payer: 59

## 2019-04-15 ENCOUNTER — Telehealth: Payer: Self-pay | Admitting: *Deleted

## 2019-04-15 NOTE — Telephone Encounter (Signed)
-----   Message from [name], MD sent at 04/15/2019  7:28 AM EST ----- Regarding: appt I received an update from her hematologist from [facility], requesting [place] to check on her in about a week or so I would like labs and see her Can you ask if she can come in for labs and appt to see me on 2/12 or 2/15 at 10, 30 mins? Also ask if ok to just draw from arm rather than accessing her port, and then proceed to schedule

## 2019-04-15 NOTE — Telephone Encounter (Signed)
Telephone call to patient to discuss message below. Patient agrees to come in on 2/12 at 930 to have peripheral labs drawn and see Dr. Alvy Bimler at 18.  Appts scheduled.

## 2019-04-20 ENCOUNTER — Other Ambulatory Visit: Payer: Self-pay | Admitting: Hematology and Oncology

## 2019-04-20 DIAGNOSIS — T451X5A Adverse effect of antineoplastic and immunosuppressive drugs, initial encounter: Secondary | ICD-10-CM

## 2019-04-20 DIAGNOSIS — C9002 Multiple myeloma in relapse: Secondary | ICD-10-CM

## 2019-04-20 DIAGNOSIS — D61810 Antineoplastic chemotherapy induced pancytopenia: Secondary | ICD-10-CM

## 2019-04-22 ENCOUNTER — Other Ambulatory Visit: Payer: Self-pay

## 2019-04-22 ENCOUNTER — Encounter: Payer: Self-pay | Admitting: Hematology and Oncology

## 2019-04-22 ENCOUNTER — Inpatient Hospital Stay: Payer: 59 | Attending: Hematology and Oncology | Admitting: Hematology and Oncology

## 2019-04-22 ENCOUNTER — Inpatient Hospital Stay: Payer: 59

## 2019-04-22 DIAGNOSIS — Z9481 Bone marrow transplant status: Secondary | ICD-10-CM | POA: Insufficient documentation

## 2019-04-22 DIAGNOSIS — T451X5A Adverse effect of antineoplastic and immunosuppressive drugs, initial encounter: Secondary | ICD-10-CM

## 2019-04-22 DIAGNOSIS — C9002 Multiple myeloma in relapse: Secondary | ICD-10-CM | POA: Diagnosis present

## 2019-04-22 DIAGNOSIS — D696 Thrombocytopenia, unspecified: Secondary | ICD-10-CM | POA: Insufficient documentation

## 2019-04-22 DIAGNOSIS — Z9221 Personal history of antineoplastic chemotherapy: Secondary | ICD-10-CM | POA: Diagnosis not present

## 2019-04-22 DIAGNOSIS — Z79899 Other long term (current) drug therapy: Secondary | ICD-10-CM | POA: Diagnosis not present

## 2019-04-22 DIAGNOSIS — D61810 Antineoplastic chemotherapy induced pancytopenia: Secondary | ICD-10-CM

## 2019-04-22 DIAGNOSIS — Z7982 Long term (current) use of aspirin: Secondary | ICD-10-CM | POA: Diagnosis not present

## 2019-04-22 LAB — COMPREHENSIVE METABOLIC PANEL
ALT: 15 U/L (ref 0–44)
AST: 28 U/L (ref 15–41)
Albumin: 4.3 g/dL (ref 3.5–5.0)
Alkaline Phosphatase: 58 U/L (ref 38–126)
Anion gap: 10 (ref 5–15)
BUN: 15 mg/dL (ref 6–20)
CO2: 29 mmol/L (ref 22–32)
Calcium: 9 mg/dL (ref 8.9–10.3)
Chloride: 105 mmol/L (ref 98–111)
Creatinine, Ser: 0.7 mg/dL (ref 0.44–1.00)
GFR calc Af Amer: >60 mL/min (ref >60)
GFR calc non Af Amer: >60 mL/min (ref >60)
Glucose, Bld: 101 mg/dL — ABNORMAL HIGH (ref 70–99)
Potassium: 4.2 mmol/L (ref 3.5–5.1)
Sodium: 144 mmol/L (ref 135–145)
Total Bilirubin: 0.3 mg/dL (ref 0.3–1.2)
Total Protein: 6.2 g/dL — ABNORMAL LOW (ref 6.5–8.1)

## 2019-04-22 LAB — CBC WITH DIFFERENTIAL/PLATELET
Abs Immature Granulocytes: 0.01 10*3/uL (ref 0.00–0.07)
Basophils Absolute: 0 10*3/uL (ref 0.0–0.1)
Basophils Relative: 1 %
Eosinophils Absolute: 0.1 10*3/uL (ref 0.0–0.5)
Eosinophils Relative: 3 %
HCT: 38.1 % (ref 36.0–46.0)
Hemoglobin: 12.7 g/dL (ref 12.0–15.0)
Immature Granulocytes: 0 %
Lymphocytes Relative: 8 %
Lymphs Abs: 0.4 10*3/uL — ABNORMAL LOW (ref 0.7–4.0)
MCH: 36.5 pg — ABNORMAL HIGH (ref 26.0–34.0)
MCHC: 33.3 g/dL (ref 30.0–36.0)
MCV: 109.5 fL — ABNORMAL HIGH (ref 80.0–100.0)
Monocytes Absolute: 0.4 10*3/uL (ref 0.1–1.0)
Monocytes Relative: 9 %
Neutro Abs: 3.7 10*3/uL (ref 1.7–7.7)
Neutrophils Relative %: 79 %
Platelets: 99 10*3/uL — ABNORMAL LOW (ref 150–400)
RBC: 3.48 MIL/uL — ABNORMAL LOW (ref 3.87–5.11)
RDW: 11.5 % (ref 11.5–15.5)
WBC: 4.7 10*3/uL (ref 4.0–10.5)
nRBC: 0 % (ref 0.0–0.2)

## 2019-04-22 LAB — SAMPLE TO BLOOD BANK

## 2019-04-22 NOTE — Assessment & Plan Note (Signed)
This is likely due to recent treatment. The patient denies recent history of bleeding such as epistaxis, hematuria or hematochezia. She is asymptomatic from the low platelet count. I will observe for now.  she does not require transfusion now.  

## 2019-04-22 NOTE — Progress Notes (Signed)
Jessica Cochran OFFICE PROGRESS NOTE  Patient Care Team: [name], MD as PCP - General [name], MD as Consulting Physician (Oncology) [name], MD as Consulting Physician (Neurosurgery) [name], MD as Consulting Physician (Hematology and Oncology) [name], [name], MD as Referring Physician (Hematology and Oncology)  ASSESSMENT & PLAN:  Multiple myeloma in relapse [facility]) She has received one dose of Belantamab so far without any side-effects She will return next week for labs only monitoring She has second dose scheduled at [facility] on 2/23   Thrombocytopenia This is likely due to recent treatment. The patient denies recent history of bleeding such as epistaxis, hematuria or hematochezia. She is asymptomatic from the low platelet count. I will observe for now.  she does not require transfusion now.    No orders of the defined types were placed in this encounter.   All questions were answered. The patient knows to call the clinic with any problems, questions or concerns. The total time spent in the appointment was 15 minutes encounter with patients including review of chart and various tests results, discussions about plan of care and coordination of care plan   [name], MD 04/22/2019 4:35 PM  INTERVAL HISTORY: Please see below for problem oriented charting. She returns for supportive care visit No side-effects from treatment so far No visual changes Complained of fatigue The patient denies any recent signs or symptoms of bleeding such as spontaneous epistaxis, hematuria or hematochezia.   SUMMARY OF ONCOLOGIC HISTORY: Oncology History  Multiple myeloma in relapse ([place])  07/21/2007 Bone Marrow Biopsy   Case #: [id number] Bone marrow showed myeloma   07/21/2007 Miscellaneous   She was diagnosed in May 2009. 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 Treatment is stopped due to minimum response and pancytopenia   10/19/2015 -  Chemotherapy   The patient had Velcade for chemotherapy treatment along with weekly Daratumumab. Last dose of Velcade on 02/29/16, stopped due to progression. Pomalidomide added on 03/14/16   04/11/2016 Adverse Reaction   Delay resumption of Pomalyst cycle 2 until 04/16/16 due to neutropenia   09/16/2016 PET scan   1. No findings of active bony lymphoma. Prior activity in the sternum and thoracic spine has resolved. 2. Focal hypermetabolic activity along the sigmoid colon with some local inflammatory stranding in the adjacent mesentery, maximum SUV 11.0. This is probably from mild acute diverticulitis. There is no hypermetabolic activity in this vicinity 4 months ago to suggest that this is from colon cancer. Correlate with any symptoms. 3. Acute right maxillary sinusitis. 4. Thoracolumbar compression fractures, vertebral augmentation at the L1 level.   09/17/2016 Procedure   CT guided bone marrow biopsy of right posterior iliac bone with both aspirate and core samples obtained.   09/17/2016 Bone Marrow Biopsy  Bone Marrow, Aspirate,Biopsy, and Clot, right iliac - SLIGHTLY HYPOCELLULAR BONE MARROW FOR AGE WITH PLASMA CELL NEOPLASM. - TRILINEAGE HEMATOPOIESIS. - SEE COMMENT. PERIPHERAL BLOOD: - LYMPHOPENIA. - THROMBOCYTOPENIA. Diagnosis Note The bone  marrow is slightly hypocellular for age with trilineage hematopoiesis and non-specific myeloid changes likely related to previous therapy. Plasma cells are increased in number representing 8% of all cells in the aspirate, but with lack of large aggregates or sheets in the clot and biopsy sections. Immunohistochemical stains highlight the slightly increased plasma cell component in the marrow which shows kappa light chain restriction consistent with residual/recurrent plasma cell neoplasm. Correlation with cytogenetic and FISH studies recommended   09/17/2016 Pathology Results   Normal bone marrow cytogenetics and FISH   04/06/2018 PET scan   1. No abnormal focus of increased radiotracer uptake identified to suggest active disease or progression of disease. 2. Similar appearance of thoracic and lumbar compression deformities with associated kyphosis deformity. 3. Thickening of the endometrium is again noted. This is indeterminate but is considered abnormal in a postmenopausal female. Consider further evaluation with pelvic sonogram.    12/15/2018 Echocardiogram   IMPRESSIONS      1. Left ventricular ejection fraction, by visual estimation, is 55 to 60%. The left ventricle has normal function. Normal left ventricular size. There is no left ventricular hypertrophy.  2. Global right ventricle has normal systolic function.The right ventricular size is mildly enlarged. No increase in right ventricular wall thickness.  3. Left atrial size was normal.  4. Right atrial size was normal.  5. The mitral valve is normal in structure. No evidence of mitral valve regurgitation. No evidence of mitral stenosis.  6. The tricuspid valve is normal in structure. Tricuspid valve regurgitation is mild.  7. The aortic valve is normal in structure. Aortic valve regurgitation was not visualized by color flow Doppler. Structurally normal aortic valve, with no evidence of sclerosis or stenosis.  8. The pulmonic valve was  normal in structure. Pulmonic valve regurgitation is not visualized by color flow Doppler.  9. The inferior vena cava is normal in size with greater than 50% respiratory variability, suggesting right atrial pressure of 3 mmHg. 10. The average left ventricular global longitudinal strain is 19.2 %.       REVIEW OF SYSTEMS:   Constitutional: Denies fevers, chills or abnormal weight loss Eyes: Denies blurriness of vision Ears, nose, mouth, throat, and face: Denies mucositis or sore throat Respiratory: Denies cough, dyspnea or wheezes Cardiovascular: Denies palpitation, chest discomfort or lower extremity swelling Gastrointestinal:  Denies nausea, heartburn or change in bowel habits Skin: Denies abnormal skin rashes Lymphatics: Denies new lymphadenopathy or easy bruising Neurological:Denies numbness, tingling or new weaknesses Behavioral/Psych: Mood is stable, no new changes  All other systems were reviewed with the patient and are negative.  I have reviewed the past medical history, past surgical history, social history and family history with the patient and they are unchanged from previous note.  ALLERGIES:  is allergic to hydromorphone.  MEDICATIONS:  Current Outpatient Medications  Medication Sig Dispense Refill  . aspirin 81 MG tablet Take 81 mg by mouth.    . cholecalciferol (VITAMIN D) 1000 UNITS tablet Take 1,000 Units by mouth daily.    . clonazePAM (KLONOPIN) 0.5 MG tablet Take 1 tablet (0.5 mg total) by mouth 2 (two) times daily as needed for anxiety. 30 tablet 0  . Multiple Vitamins-Minerals (ONE-A-DAY 50 PLUS PO) Take by mouth daily.    . ondansetron (ZOFRAN) 8 MG tablet Take  1 tablet (8 mg total) by mouth every 8 (eight) hours as needed for nausea. 30 tablet 3  . oxycodone (OXY-IR) 5 MG capsule Take 5 mg by mouth every 4 (four) hours as needed. pain    . Oxycodone HCl (OXYCONTIN) 60 MG TB12 Take 1 tablet by mouth 2 (two) times daily.    . pantoprazole (PROTONIX) 40 MG  tablet TAKE (1) TABLET DAILY AS NEEDED. 90 tablet 11  . prochlorperazine (COMPAZINE) 10 MG tablet Take 1 tablet (10 mg total) by mouth every 6 (six) hours as needed for nausea or vomiting. (Patient not taking: Reported on 02/25/2019) 30 tablet 1  . valACYclovir (VALTREX) 1000 MG tablet TAKE 1 TABLET ONCE DAILY. 30 tablet 11   No current facility-administered medications for this visit.    PHYSICAL EXAMINATION: ECOG PERFORMANCE STATUS: 0 - Asymptomatic  Vitals:   04/22/19 1009  BP: (!) 145/93  Pulse: 98  Resp: 18  Temp: 98.3 F (36.8 C)  SpO2: 100%   Filed Weights   04/22/19 1009  Weight: 110 lb 12.8 oz (50.3 kg)    GENERAL:alert, no distress and comfortable NEURO: alert & oriented x 3 with fluent speech, no focal motor/sensory deficits  LABORATORY DATA:  I have reviewed the data as listed    Component Value Date/Time   NA 144 04/22/2019 0943   NA 140 03/11/2017 1150   K 4.2 04/22/2019 0943   K 3.9 03/11/2017 1150   CL 105 04/22/2019 0943   CL 107 08/30/2012 1055   CO2 29 04/22/2019 0943   CO2 29 03/11/2017 1150   GLUCOSE 101 (H) 04/22/2019 0943   GLUCOSE 93 03/11/2017 1150   GLUCOSE 102 (H) 08/30/2012 1055   BUN 15 04/22/2019 0943   BUN 12.5 03/11/2017 1150   CREATININE 0.70 04/22/2019 0943   CREATININE 0.77 07/23/2018 0808   CREATININE 0.8 03/11/2017 1150   CALCIUM 9.0 04/22/2019 0943   CALCIUM 8.7 03/11/2017 1150   PROT 6.2 (L) 04/22/2019 0943   PROT 6.0 (L) 03/11/2017 1150   ALBUMIN 4.3 04/22/2019 0943   ALBUMIN 3.8 03/11/2017 1150   AST 28 04/22/2019 0943   AST 21 07/23/2018 0808   AST 18 03/11/2017 1150   ALT 15 04/22/2019 0943   ALT 16 07/23/2018 0808   ALT 14 03/11/2017 1150   ALKPHOS 58 04/22/2019 0943   ALKPHOS 45 03/11/2017 1150   BILITOT 0.3 04/22/2019 0943   BILITOT 0.5 07/23/2018 0808   BILITOT 0.37 03/11/2017 1150   GFRNONAA >60 04/22/2019 0943   GFRNONAA >60 07/23/2018 0808   GFRAA >60 04/22/2019 0943   GFRAA >60 07/23/2018 0808     No results found for: SPEP, UPEP  Lab Results  Component Value Date   WBC 4.7 04/22/2019   NEUTROABS 3.7 04/22/2019   HGB 12.7 04/22/2019   HCT 38.1 04/22/2019   MCV 109.5 (H) 04/22/2019   PLT 99 (L) 04/22/2019      Chemistry      Component Value Date/Time   NA 144 04/22/2019 0943   NA 140 03/11/2017 1150   K 4.2 04/22/2019 0943   K 3.9 03/11/2017 1150   CL 105 04/22/2019 0943   CL 107 08/30/2012 1055   CO2 29 04/22/2019 0943   CO2 29 03/11/2017 1150   BUN 15 04/22/2019 0943   BUN 12.5 03/11/2017 1150   CREATININE 0.70 04/22/2019 0943   CREATININE 0.77 07/23/2018 0808   CREATININE 0.8 03/11/2017 1150      Component Value Date/Time  CALCIUM 9.0 04/22/2019 0943   CALCIUM 8.7 03/11/2017 1150   ALKPHOS 58 04/22/2019 0943   ALKPHOS 45 03/11/2017 1150   AST 28 04/22/2019 0943   AST 21 07/23/2018 0808   AST 18 03/11/2017 1150   ALT 15 04/22/2019 0943   ALT 16 07/23/2018 0808   ALT 14 03/11/2017 1150   BILITOT 0.3 04/22/2019 0943   BILITOT 0.5 07/23/2018 0808   BILITOT 0.37 03/11/2017 1150

## 2019-04-22 NOTE — Assessment & Plan Note (Signed)
She has received one dose of Belantamab so far without any side-effects She will return next week for labs only monitoring She has second dose scheduled at [facility] on 2/23

## 2019-04-25 ENCOUNTER — Telehealth: Payer: Self-pay | Admitting: Hematology and Oncology

## 2019-04-25 NOTE — Telephone Encounter (Signed)
Scheduled per 2/12 sch msg. Called and spoke with pt, confirmed 2/18 appt  

## 2019-04-28 ENCOUNTER — Other Ambulatory Visit: Payer: Self-pay

## 2019-04-28 ENCOUNTER — Inpatient Hospital Stay: Payer: 59

## 2019-04-28 DIAGNOSIS — D61810 Antineoplastic chemotherapy induced pancytopenia: Secondary | ICD-10-CM

## 2019-04-28 DIAGNOSIS — C9002 Multiple myeloma in relapse: Secondary | ICD-10-CM

## 2019-04-28 LAB — COMPREHENSIVE METABOLIC PANEL
ALT: 33 U/L (ref 0–44)
AST: 47 U/L — ABNORMAL HIGH (ref 15–41)
Albumin: 4.3 g/dL (ref 3.5–5.0)
Alkaline Phosphatase: 49 U/L (ref 38–126)
Anion gap: 9 (ref 5–15)
BUN: 14 mg/dL (ref 6–20)
CO2: 27 mmol/L (ref 22–32)
Calcium: 9.2 mg/dL (ref 8.9–10.3)
Chloride: 106 mmol/L (ref 98–111)
Creatinine, Ser: 0.73 mg/dL (ref 0.44–1.00)
GFR calc Af Amer: >60 mL/min (ref >60)
GFR calc non Af Amer: >60 mL/min (ref >60)
Glucose, Bld: 97 mg/dL (ref 70–99)
Potassium: 4.1 mmol/L (ref 3.5–5.1)
Sodium: 142 mmol/L (ref 135–145)
Total Bilirubin: 0.4 mg/dL (ref 0.3–1.2)
Total Protein: 6.3 g/dL — ABNORMAL LOW (ref 6.5–8.1)

## 2019-04-28 LAB — CBC WITH DIFFERENTIAL/PLATELET
Abs Immature Granulocytes: 0.01 10*3/uL (ref 0.00–0.07)
Basophils Absolute: 0.1 10*3/uL (ref 0.0–0.1)
Basophils Relative: 2 %
Eosinophils Absolute: 0.2 10*3/uL (ref 0.0–0.5)
Eosinophils Relative: 7 %
HCT: 37.5 % (ref 36.0–46.0)
Hemoglobin: 12.5 g/dL (ref 12.0–15.0)
Immature Granulocytes: 0 %
Lymphocytes Relative: 12 %
Lymphs Abs: 0.4 10*3/uL — ABNORMAL LOW (ref 0.7–4.0)
MCH: 36.3 pg — ABNORMAL HIGH (ref 26.0–34.0)
MCHC: 33.3 g/dL (ref 30.0–36.0)
MCV: 109 fL — ABNORMAL HIGH (ref 80.0–100.0)
Monocytes Absolute: 0.4 10*3/uL (ref 0.1–1.0)
Monocytes Relative: 11 %
Neutro Abs: 2.3 10*3/uL (ref 1.7–7.7)
Neutrophils Relative %: 68 %
Platelets: 97 10*3/uL — ABNORMAL LOW (ref 150–400)
RBC: 3.44 MIL/uL — ABNORMAL LOW (ref 3.87–5.11)
RDW: 11.6 % (ref 11.5–15.5)
WBC: 3.3 10*3/uL — ABNORMAL LOW (ref 4.0–10.5)
nRBC: 0 % (ref 0.0–0.2)

## 2019-04-28 LAB — SAMPLE TO BLOOD BANK

## 2019-04-29 ENCOUNTER — Telehealth: Payer: Self-pay | Admitting: *Deleted

## 2019-04-29 NOTE — Telephone Encounter (Signed)
Telephone call to patient and advised lab results as indicated below. Patient appreciated call.

## 2019-04-29 NOTE — Telephone Encounter (Signed)
-----   Message from [name], MD sent at 04/28/2019 11:39 AM EST ----- Regarding: labs Pls let her know CBC showed reduced platelets and WBC a bit low Hemoglobin is ok No need transfusion

## 2019-08-10 ENCOUNTER — Telehealth: Payer: Self-pay | Admitting: Hematology and Oncology

## 2019-08-10 NOTE — Telephone Encounter (Signed)
Scheduled appt per 6/1 sch message - pt aware of appt date and time

## 2019-08-11 ENCOUNTER — Inpatient Hospital Stay: Payer: 59 | Attending: Hematology and Oncology

## 2019-08-11 ENCOUNTER — Other Ambulatory Visit: Payer: Self-pay

## 2019-08-11 DIAGNOSIS — C9002 Multiple myeloma in relapse: Secondary | ICD-10-CM | POA: Insufficient documentation

## 2019-08-11 DIAGNOSIS — Z9221 Personal history of antineoplastic chemotherapy: Secondary | ICD-10-CM | POA: Insufficient documentation

## 2019-08-11 DIAGNOSIS — Z452 Encounter for adjustment and management of vascular access device: Secondary | ICD-10-CM | POA: Diagnosis not present

## 2019-08-11 DIAGNOSIS — Z95828 Presence of other vascular implants and grafts: Secondary | ICD-10-CM

## 2019-08-11 MED ORDER — HEPARIN SOD (PORK) LOCK FLUSH 100 UNIT/ML IV SOLN
500.0000 [IU] | Freq: Once | INTRAVENOUS | Status: AC
  Administered 2019-08-11: 500 [IU] via INTRAVENOUS
  Filled 2019-08-11: qty 5

## 2019-08-11 MED ORDER — SODIUM CHLORIDE 0.9% FLUSH
10.0000 mL | INTRAVENOUS | Status: DC | PRN
  Administered 2019-08-11: 10 mL via INTRAVENOUS
  Filled 2019-08-11: qty 10

## 2019-09-26 ENCOUNTER — Other Ambulatory Visit: Payer: Self-pay | Admitting: Hematology and Oncology

## 2019-10-25 ENCOUNTER — Other Ambulatory Visit: Payer: Self-pay | Admitting: Hematology and Oncology

## 2019-10-25 ENCOUNTER — Telehealth: Payer: Self-pay

## 2019-10-25 DIAGNOSIS — C9002 Multiple myeloma in relapse: Secondary | ICD-10-CM

## 2019-10-25 NOTE — Telephone Encounter (Signed)
Jessica Cochran from Dr Dario Guardian office at [facility] called requesting pt have labs drawn here at [facility] so pt would not have to drive so far just for labs. Dr Alvy Bimler placed orders fro CBC, CMP, serum protein electrophoresis, serum free LT chains, per Dr Dario Guardian request.  This nurse called pt to explain to her and she is in agreement with this plan. Lab appt set for 8/19 at 0945, and understands she should be here by 0930 for check in. Notified Jessica Cochran this was taken care of.

## 2019-10-27 ENCOUNTER — Inpatient Hospital Stay: Payer: 59 | Attending: Hematology and Oncology

## 2019-10-27 ENCOUNTER — Other Ambulatory Visit: Payer: Self-pay

## 2019-10-27 ENCOUNTER — Inpatient Hospital Stay: Payer: 59

## 2019-10-27 DIAGNOSIS — C9002 Multiple myeloma in relapse: Secondary | ICD-10-CM

## 2019-10-27 LAB — CBC WITH DIFFERENTIAL/PLATELET
Abs Immature Granulocytes: 0.01 10*3/uL (ref 0.00–0.07)
Basophils Absolute: 0.1 10*3/uL (ref 0.0–0.1)
Basophils Relative: 2 %
Eosinophils Absolute: 0.1 10*3/uL (ref 0.0–0.5)
Eosinophils Relative: 4 %
HCT: 35.8 % — ABNORMAL LOW (ref 36.0–46.0)
Hemoglobin: 12.1 g/dL (ref 12.0–15.0)
Immature Granulocytes: 0 %
Lymphocytes Relative: 29 %
Lymphs Abs: 1 10*3/uL (ref 0.7–4.0)
MCH: 35.4 pg — ABNORMAL HIGH (ref 26.0–34.0)
MCHC: 33.8 g/dL (ref 30.0–36.0)
MCV: 104.7 fL — ABNORMAL HIGH (ref 80.0–100.0)
Monocytes Absolute: 0.4 10*3/uL (ref 0.1–1.0)
Monocytes Relative: 13 %
Neutro Abs: 1.8 10*3/uL (ref 1.7–7.7)
Neutrophils Relative %: 52 %
Platelets: 107 10*3/uL — ABNORMAL LOW (ref 150–400)
RBC: 3.42 MIL/uL — ABNORMAL LOW (ref 3.87–5.11)
RDW: 12.9 % (ref 11.5–15.5)
WBC: 3.3 10*3/uL — ABNORMAL LOW (ref 4.0–10.5)
nRBC: 0 % (ref 0.0–0.2)

## 2019-10-27 LAB — COMPREHENSIVE METABOLIC PANEL
ALT: 15 U/L (ref 0–44)
AST: 31 U/L (ref 15–41)
Albumin: 4 g/dL (ref 3.5–5.0)
Alkaline Phosphatase: 77 U/L (ref 38–126)
Anion gap: 8 (ref 5–15)
BUN: 13 mg/dL (ref 6–20)
CO2: 30 mmol/L (ref 22–32)
Calcium: 9.6 mg/dL (ref 8.9–10.3)
Chloride: 104 mmol/L (ref 98–111)
Creatinine, Ser: 0.79 mg/dL (ref 0.44–1.00)
GFR calc Af Amer: >60 mL/min (ref >60)
GFR calc non Af Amer: >60 mL/min (ref >60)
Glucose, Bld: 89 mg/dL (ref 70–99)
Potassium: 3.4 mmol/L — ABNORMAL LOW (ref 3.5–5.1)
Sodium: 142 mmol/L (ref 135–145)
Total Bilirubin: 0.5 mg/dL (ref 0.3–1.2)
Total Protein: 6 g/dL — ABNORMAL LOW (ref 6.5–8.1)

## 2019-10-27 MED ORDER — HEPARIN SOD (PORK) LOCK FLUSH 100 UNIT/ML IV SOLN
500.0000 [IU] | Freq: Once | INTRAVENOUS | Status: DC
  Filled 2019-10-27: qty 5

## 2019-10-27 MED ORDER — SODIUM CHLORIDE 0.9% FLUSH
10.0000 mL | INTRAVENOUS | Status: DC | PRN
  Filled 2019-10-27: qty 10

## 2019-10-28 LAB — KAPPA/LAMBDA LIGHT CHAINS
Kappa free light chain: 369.2 mg/L — ABNORMAL HIGH (ref 3.3–19.4)
Kappa, lambda light chain ratio: 111.88 — ABNORMAL HIGH (ref 0.26–1.65)
Lambda free light chains: 3.3 mg/L — ABNORMAL LOW (ref 5.7–26.3)

## 2019-10-31 ENCOUNTER — Telehealth: Payer: Self-pay

## 2019-10-31 ENCOUNTER — Other Ambulatory Visit: Payer: Self-pay | Admitting: Hematology and Oncology

## 2019-10-31 DIAGNOSIS — C9002 Multiple myeloma in relapse: Secondary | ICD-10-CM

## 2019-10-31 LAB — MULTIPLE MYELOMA PANEL, SERUM
Albumin SerPl Elph-Mcnc: 3.8 g/dL (ref 2.9–4.4)
Albumin/Glob SerPl: 2.2 — ABNORMAL HIGH (ref 0.7–1.7)
Alpha 1: 0.3 g/dL (ref 0.0–0.4)
Alpha2 Glob SerPl Elph-Mcnc: 0.6 g/dL (ref 0.4–1.0)
B-Globulin SerPl Elph-Mcnc: 0.8 g/dL (ref 0.7–1.3)
Gamma Glob SerPl Elph-Mcnc: 0.1 g/dL — ABNORMAL LOW (ref 0.4–1.8)
Globulin, Total: 1.8 g/dL — ABNORMAL LOW (ref 2.2–3.9)
IgA: <5 mg/dL — ABNORMAL LOW (ref 87–352)
IgG (Immunoglobin G), Serum: 97 mg/dL — ABNORMAL LOW (ref 586–1602)
IgM (Immunoglobulin M), Srm: <5 mg/dL — ABNORMAL LOW (ref 26–217)
Total Protein ELP: 5.6 g/dL — ABNORMAL LOW (ref 6.0–8.5)

## 2019-10-31 NOTE — Telephone Encounter (Signed)
LM @ [phone] regarding bone marrow biopsy.  Spoke to husband [name] at [phone].  He stated patient would definitely want sedation for bone marrow biopsy.  He will speak to patient and return call about scheduling procedure.

## 2019-10-31 NOTE — Telephone Encounter (Signed)
-----  Message from Flo Shanks, RN sent at 10/31/2019 10:12 AM EDT ----- Regarding: FW: sedated bone marrow  ----- Message ----- From: [name], MD Sent: 10/31/2019   8:05 AM EDT To: Flo Shanks, RN Subject: sedated bone marrow                            I received email from [facility] stating she wants her bone marrow biopsy done locally Does she want it done under sedation (CT) If she is comfortable, I can just order it and she does not need to see me If she wants to see me before I order it, let me know

## 2019-11-07 ENCOUNTER — Ambulatory Visit (HOSPITAL_COMMUNITY): Payer: 59

## 2019-11-07 ENCOUNTER — Other Ambulatory Visit: Payer: Self-pay | Admitting: Student

## 2019-11-07 ENCOUNTER — Other Ambulatory Visit: Payer: Self-pay | Admitting: Radiology

## 2019-11-08 ENCOUNTER — Ambulatory Visit (HOSPITAL_COMMUNITY)
Admission: RE | Admit: 2019-11-08 | Discharge: 2019-11-08 | Disposition: A | Discharge status: Home / Self Care | Payer: 59 | Source: Ambulatory Visit | Attending: Hematology and Oncology | Admitting: Hematology and Oncology

## 2019-11-08 ENCOUNTER — Other Ambulatory Visit: Payer: Self-pay

## 2019-11-08 ENCOUNTER — Encounter (HOSPITAL_COMMUNITY): Payer: Self-pay

## 2019-11-08 DIAGNOSIS — C9002 Multiple myeloma in relapse: Secondary | ICD-10-CM

## 2019-11-08 DIAGNOSIS — Z7982 Long term (current) use of aspirin: Secondary | ICD-10-CM | POA: Diagnosis not present

## 2019-11-08 DIAGNOSIS — C9000 Multiple myeloma not having achieved remission: Secondary | ICD-10-CM | POA: Diagnosis not present

## 2019-11-08 DIAGNOSIS — D696 Thrombocytopenia, unspecified: Secondary | ICD-10-CM | POA: Insufficient documentation

## 2019-11-08 DIAGNOSIS — Z79899 Other long term (current) drug therapy: Secondary | ICD-10-CM | POA: Diagnosis not present

## 2019-11-08 DIAGNOSIS — M1990 Unspecified osteoarthritis, unspecified site: Secondary | ICD-10-CM | POA: Diagnosis not present

## 2019-11-08 LAB — CBC WITH DIFFERENTIAL/PLATELET
Abs Immature Granulocytes: 0.01 10*3/uL (ref 0.00–0.07)
Basophils Absolute: 0.1 10*3/uL (ref 0.0–0.1)
Basophils Relative: 1 %
Eosinophils Absolute: 0.1 10*3/uL (ref 0.0–0.5)
Eosinophils Relative: 2 %
HCT: 39 % (ref 36.0–46.0)
Hemoglobin: 13 g/dL (ref 12.0–15.0)
Immature Granulocytes: 0 %
Lymphocytes Relative: 19 %
Lymphs Abs: 0.8 10*3/uL (ref 0.7–4.0)
MCH: 35.1 pg — ABNORMAL HIGH (ref 26.0–34.0)
MCHC: 33.3 g/dL (ref 30.0–36.0)
MCV: 105.4 fL — ABNORMAL HIGH (ref 80.0–100.0)
Monocytes Absolute: 0.5 10*3/uL (ref 0.1–1.0)
Monocytes Relative: 13 %
Neutro Abs: 2.7 10*3/uL (ref 1.7–7.7)
Neutrophils Relative %: 65 %
Platelets: 125 10*3/uL — ABNORMAL LOW (ref 150–400)
RBC: 3.7 MIL/uL — ABNORMAL LOW (ref 3.87–5.11)
RDW: 12.6 % (ref 11.5–15.5)
WBC: 4.2 10*3/uL (ref 4.0–10.5)
nRBC: 0 % (ref 0.0–0.2)

## 2019-11-08 LAB — PROTIME-INR
INR: 0.9 (ref 0.8–1.2)
Prothrombin Time: 11.8 seconds (ref 11.4–15.2)

## 2019-11-08 MED ORDER — LIDOCAINE HCL (PF) 1 % IJ SOLN
INTRAMUSCULAR | Status: AC | PRN
  Administered 2019-11-08: 10 mL

## 2019-11-08 MED ORDER — FENTANYL CITRATE (PF) 100 MCG/2ML IJ SOLN
INTRAMUSCULAR | Status: AC
  Filled 2019-11-08: qty 2

## 2019-11-08 MED ORDER — MIDAZOLAM HCL 2 MG/2ML IJ SOLN
INTRAMUSCULAR | Status: AC | PRN
  Administered 2019-11-08 (×2): 1 mg via INTRAVENOUS

## 2019-11-08 MED ORDER — FENTANYL CITRATE (PF) 100 MCG/2ML IJ SOLN
INTRAMUSCULAR | Status: AC | PRN
Start: 2019-11-08 — End: 2019-11-08
  Administered 2019-11-08 (×2): 50 ug via INTRAVENOUS

## 2019-11-08 MED ORDER — MIDAZOLAM HCL 2 MG/2ML IJ SOLN
INTRAMUSCULAR | Status: AC
  Filled 2019-11-08: qty 4

## 2019-11-08 MED ORDER — SODIUM CHLORIDE 0.9 % IV SOLN: INTRAVENOUS | Status: DC

## 2019-11-08 NOTE — Procedures (Signed)
Interventional Radiology Procedure Note  Procedure: CT BM ASP AND CORE    Complications: None  Estimated Blood Loss:  MIN  Findings: 11 G CORE AND ASP    M. TREVOR Yaeli Hartung, MD    

## 2019-11-08 NOTE — H&P (Signed)
Referring Physician(s): [name]  Supervising Physician: Daryll Brod  Patient Status:  [facility] OP  Chief Complaint: "I'm having a bone marrow biopsy"   Subjective: Patient familiar to IR service from Port-A-Cath placement in 2009 and bone marrow biopsy in 2018.  She has a history of recurrent myeloma and presents again today for CT-guided bone marrow biopsy for staging purposes.  She currently denies fever, headache, chest pain, dyspnea, cough, abdominal/back pain, nausea, vomiting or bleeding.  Past Medical History:  Diagnosis Date  . Arthritis   . Cough 06/09/2014  . Dysuria 06/09/2014  . Herpes zoster infection 04/29/2011  . Multiple myeloma in relapse ([place]) 10/12/2011   08/19/11: increase in kappa serum free light chains; 7/11: elevation urine protein (157 mg/24 hr) and reappearance monoclonal kappa light chains; 7/18: bone marrow biopsy 4% plasma cells; bone X-rays: no new lesions  . Multiple myeloma in remission ([place]) 04/29/2011  . Pelvic mass in female 08/28/2014  . Renal stone   . Yeast infection 06/08/2015   Past Surgical History:  Procedure Laterality Date  . BACK SURGERY     2009  . PILONIDAL CYST DRAINAGE  1991  . rectal abcess    . stem cell transplant autologus    . uterine fibroid tumor removed     1990's       Allergies: Hydromorphone  Medications: Prior to Admission medications   Medication Sig Start Date End Date Taking? Authorizing Provider  aspirin 81 MG tablet Take 81 mg by mouth. 01/13/12   [provider]  cholecalciferol (VITAMIN D) 1000 UNITS tablet Take 1,000 Units by mouth daily.    [provider]  clonazePAM (KLONOPIN) 0.5 MG tablet Take 1 tablet (0.5 mg total) by mouth 2 (two) times daily as needed for anxiety. 02/25/19   Tanner, Lyndon Code., PA-C  Multiple Vitamins-Minerals (ONE-A-DAY 50 PLUS PO) Take by mouth daily.    [provider]  ondansetron (ZOFRAN) 8 MG tablet Take 1 tablet (8 mg total) by mouth every 8 (eight)  hours as needed for nausea. 12/17/18   [name], MD  oxycodone (OXY-IR) 5 MG capsule Take 5 mg by mouth every 4 (four) hours as needed. pain    [provider]  Oxycodone HCl (OXYCONTIN) 60 MG TB12 Take 1 tablet by mouth 2 (two) times daily.    [provider]  pantoprazole (PROTONIX) 40 MG tablet TAKE (1) TABLET DAILY AS NEEDED. 07/23/18   [name], MD  prochlorperazine (COMPAZINE) 10 MG tablet Take 1 tablet (10 mg total) by mouth every 6 (six) hours as needed for nausea or vomiting. Patient not taking: Reported on 02/25/2019 12/17/18   [name], MD  valACYclovir (VALTREX) 1000 MG tablet TAKE 1 TABLET ONCE DAILY. 09/29/18   [name], MD     Vital Signs: BP (!) 138/91 (BP Location: Left Arm)   Pulse 98   Temp 98.4 F (36.9 C) (Oral)   SpO2 100%   Physical Exam awake, alert.  Chest clear to auscultation bilaterally.  Heart with regular rate and rhythm.  Abdomen soft, positive bowel sounds, nontender.  Trace lower extremity edema bilaterally.  Imaging: No results found.  Labs:  CBC: Recent Labs    03/25/19 0740 04/22/19 0943 04/28/19 1049 10/27/19 0957  WBC 3.6* 4.7 3.3* 3.3*  HGB 10.5* 12.7 12.5 12.1  HCT 31.6* 38.1 37.5 35.8*  PLT 81* 99* 97* 107*    COAGS: No results for input(s): INR, APTT in the last 8760 hours.  BMP: Recent  Labs    03/25/19 0740 04/22/19 0943 04/28/19 1049 10/27/19 0957  NA 143 144 142 142  K 3.9 4.2 4.1 3.4*  CL 107 105 106 104  CO2 _0 GLUCOSE 92 101* 97 89  BUN _1 CALCIUM 8.4* 9.0 9.2 9.6  CREATININE 0.67 0.70 0.73 0.79  GFRNONAA >60 >60 >60 >60  GFRAA >60 >60 >60 >60    LIVER FUNCTION TESTS: Recent Labs    03/25/19 0740 04/22/19 0943 04/28/19 1049 10/27/19 0957  BILITOT 0.6 0.3 0.4 0.5  AST 19 28 47* 31  ALT 11 15 33 15  ALKPHOS 43 58 49 77  PROT 5.7* 6.2* 6.3* 6.0*  ALBUMIN 4.1 4.3 4.3 4.0    Assessment and Plan: Pt with history of recurrent/relapsing myeloma ; presents   today for CT-guided bone marrow biopsy for staging purposes.  Risks and benefits of procedure was discussed with the patient  including, but not limited to bleeding, infection, damage to adjacent structures or low yield requiring additional tests.  All of the questions were answered and there is agreement to proceed.  Consent signed and in chart.     Electronically Signed: D. Rowe Robert, PA-C 11/08/2019, 9:57 AM   I spent a total of 15 minutes at the the patient's bedside AND on the patient's hospital floor or unit, greater than 50% of which was counseling/coordinating care for CT-guided bone marrow biopsy

## 2019-11-08 NOTE — Discharge Instructions (Signed)
Please call Interventional Radiology clinic [phone] with any questions or concerns.  You may remove your dressing and shower tomorrow.   Bone Marrow Aspiration and Bone Marrow Biopsy, Adult, Care After This sheet gives you information about how to care for yourself after your procedure. Your health care provider may also give you more specific instructions. If you have problems or questions, contact your health care provider. What can I expect after the procedure? After the procedure, it is common to have:  Mild pain and tenderness.  Swelling.  Bruising. Follow these instructions at home: Puncture site care   Follow instructions from your health care provider about how to take care of the puncture site. Make sure you: ? Wash your hands with soap and water before and after you change your bandage (dressing). If soap and water are not available, use hand sanitizer. ? Change your dressing as told by your health care provider.  Check your puncture site every day for signs of infection. Check for: ? More redness, swelling, or pain. ? Fluid or blood. ? Warmth. ? Pus or a bad smell. Activity  Return to your normal activities as told by your health care provider. Ask your health care provider what activities are safe for you.  Do not lift anything that is heavier than 10 lb (4.5 kg), or the limit that you are told, until your health care provider says that it is safe.  Do not drive for 24 hours if you were given a sedative during your procedure. General instructions   Take over-the-counter and prescription medicines only as told by your health care provider.  Do not take baths, swim, or use a hot tub until your health care provider approves. Ask your health care provider if you may take showers. You may only be allowed to take sponge baths.  If directed, put ice on the affected area. To do this: ? Put ice in a plastic bag. ? Place a towel between your skin and the  bag. ? Leave the ice on for 20 minutes, 2-3 times a day.  Keep all follow-up visits as told by your health care provider. This is important. Contact a health care provider if:  Your pain is not controlled with medicine.  You have a fever.  You have more redness, swelling, or pain around the puncture site.  You have fluid or blood coming from the puncture site.  Your puncture site feels warm to the touch.  You have pus or a bad smell coming from the puncture site. Summary  After the procedure, it is common to have mild pain, tenderness, swelling, and bruising.  Follow instructions from your health care provider about how to take care of the puncture site and what activities are safe for you.  Take over-the-counter and prescription medicines only as told by your health care provider.  Contact a health care provider if you have any signs of infection, such as fluid or blood coming from the puncture site. This information is not intended to replace advice given to you by your health care provider. Make sure you discuss any questions you have with your health care provider. Document Revised: 07/13/2018 Document Reviewed: 07/13/2018 Elsevier Patient Education  2020 Elsevier Inc[employer]   Moderate Conscious Sedation, Adult, Care After These instructions provide you with information about caring for yourself after your procedure. Your health care provider may also give you more specific instructions. Your treatment has been planned according to current medical practices, but problems sometimes occur. Call   your health care provider if you have any problems or questions after your procedure. What can I expect after the procedure? After your procedure, it is common:  To feel sleepy for several hours.  To feel clumsy and have poor balance for several hours.  To have poor judgment for several hours.  To vomit if you eat too soon. Follow these instructions at home: For at least 24 hours after  the procedure:   Do not: ? Participate in activities where you could fall or become injured. ? Drive. ? Use heavy machinery. ? Drink alcohol. ? Take sleeping pills or medicines that cause drowsiness. ? Make important decisions or sign legal documents. ? Take care of children on your own.  Rest. Eating and drinking  Follow the diet recommended by your health care provider.  If you vomit: ? Drink water, juice, or soup when you can drink without vomiting. ? Make sure you have little or no nausea before eating solid foods. General instructions  Have a responsible adult stay with you until you are awake and alert.  Take over-the-counter and prescription medicines only as told by your health care provider.  If you smoke, do not smoke without supervision.  Keep all follow-up visits as told by your health care provider. This is important. Contact a health care provider if:  You keep feeling nauseous or you keep vomiting.  You feel light-headed.  You develop a rash.  You have a fever. Get help right away if:  You have trouble breathing. This information is not intended to replace advice given to you by your health care provider. Make sure you discuss any questions you have with your health care provider. Document Revised: 02/06/2017 Document Reviewed: 06/16/2015 Elsevier Patient Education  2020 Elsevier Inc.  

## 2019-11-10 LAB — SURGICAL PATHOLOGY

## 2019-11-17 ENCOUNTER — Encounter (HOSPITAL_COMMUNITY): Payer: Self-pay | Admitting: Hematology and Oncology

## 2019-11-17 ENCOUNTER — Other Ambulatory Visit: Payer: Self-pay | Admitting: Hematology and Oncology

## 2019-11-17 ENCOUNTER — Telehealth: Payer: Self-pay | Admitting: Hematology and Oncology

## 2019-11-17 NOTE — Telephone Encounter (Signed)
Pt called in for an appt - scheduled appt per 9/9 sch msg - pt is aware of appt .

## 2019-11-25 ENCOUNTER — Encounter: Payer: Self-pay | Admitting: Hematology and Oncology

## 2019-11-25 ENCOUNTER — Inpatient Hospital Stay (HOSPITAL_BASED_OUTPATIENT_CLINIC_OR_DEPARTMENT_OTHER): Payer: 59 | Admitting: Hematology and Oncology

## 2019-11-25 ENCOUNTER — Other Ambulatory Visit: Payer: Self-pay

## 2019-11-25 ENCOUNTER — Inpatient Hospital Stay: Payer: 59 | Attending: Hematology and Oncology

## 2019-11-25 ENCOUNTER — Inpatient Hospital Stay: Payer: 59

## 2019-11-25 DIAGNOSIS — Z79899 Other long term (current) drug therapy: Secondary | ICD-10-CM | POA: Diagnosis not present

## 2019-11-25 DIAGNOSIS — I082 Rheumatic disorders of both aortic and tricuspid valves: Secondary | ICD-10-CM | POA: Insufficient documentation

## 2019-11-25 DIAGNOSIS — C9002 Multiple myeloma in relapse: Secondary | ICD-10-CM

## 2019-11-25 DIAGNOSIS — D696 Thrombocytopenia, unspecified: Secondary | ICD-10-CM | POA: Diagnosis not present

## 2019-11-25 DIAGNOSIS — Z95828 Presence of other vascular implants and grafts: Secondary | ICD-10-CM

## 2019-11-25 DIAGNOSIS — Z885 Allergy status to narcotic agent status: Secondary | ICD-10-CM | POA: Diagnosis not present

## 2019-11-25 LAB — CBC WITH DIFFERENTIAL/PLATELET
Abs Immature Granulocytes: 0 10*3/uL (ref 0.00–0.07)
Basophils Absolute: 0 10*3/uL (ref 0.0–0.1)
Basophils Relative: 1 %
Eosinophils Absolute: 0.1 10*3/uL (ref 0.0–0.5)
Eosinophils Relative: 1 %
HCT: 37.3 % (ref 36.0–46.0)
Hemoglobin: 12.4 g/dL (ref 12.0–15.0)
Immature Granulocytes: 0 %
Lymphocytes Relative: 13 %
Lymphs Abs: 0.7 10*3/uL (ref 0.7–4.0)
MCH: 35.5 pg — ABNORMAL HIGH (ref 26.0–34.0)
MCHC: 33.2 g/dL (ref 30.0–36.0)
MCV: 106.9 fL — ABNORMAL HIGH (ref 80.0–100.0)
Monocytes Absolute: 0.5 10*3/uL (ref 0.1–1.0)
Monocytes Relative: 9 %
Neutro Abs: 4.5 10*3/uL (ref 1.7–7.7)
Neutrophils Relative %: 76 %
Platelets: 117 10*3/uL — ABNORMAL LOW (ref 150–400)
RBC: 3.49 MIL/uL — ABNORMAL LOW (ref 3.87–5.11)
RDW: 12.8 % (ref 11.5–15.5)
WBC: 5.8 10*3/uL (ref 4.0–10.5)
nRBC: 0 % (ref 0.0–0.2)

## 2019-11-25 LAB — COMPREHENSIVE METABOLIC PANEL
ALT: 17 U/L (ref 0–44)
AST: 29 U/L (ref 15–41)
Albumin: 4 g/dL (ref 3.5–5.0)
Alkaline Phosphatase: 68 U/L (ref 38–126)
Anion gap: 7 (ref 5–15)
BUN: 21 mg/dL — ABNORMAL HIGH (ref 6–20)
CO2: 29 mmol/L (ref 22–32)
Calcium: 9.1 mg/dL (ref 8.9–10.3)
Chloride: 106 mmol/L (ref 98–111)
Creatinine, Ser: 0.79 mg/dL (ref 0.44–1.00)
GFR calc Af Amer: >60 mL/min (ref >60)
GFR calc non Af Amer: >60 mL/min (ref >60)
Glucose, Bld: 100 mg/dL — ABNORMAL HIGH (ref 70–99)
Potassium: 4 mmol/L (ref 3.5–5.1)
Sodium: 142 mmol/L (ref 135–145)
Total Bilirubin: 0.4 mg/dL (ref 0.3–1.2)
Total Protein: 6 g/dL — ABNORMAL LOW (ref 6.5–8.1)

## 2019-11-25 MED ORDER — ZOLEDRONIC ACID 4 MG/100ML IV SOLN
4.0000 mg | Freq: Once | INTRAVENOUS | Status: AC
  Administered 2019-11-25: 4 mg via INTRAVENOUS

## 2019-11-25 MED ORDER — SODIUM CHLORIDE 0.9% FLUSH
10.0000 mL | INTRAVENOUS | Status: AC | PRN
  Administered 2019-11-25: 10 mL
  Filled 2019-11-25: qty 10

## 2019-11-25 MED ORDER — SODIUM CHLORIDE 0.9 % IV SOLN
Freq: Once | INTRAVENOUS | Status: AC
  Filled 2019-11-25: qty 250

## 2019-11-25 MED ORDER — ZOLEDRONIC ACID 4 MG/100ML IV SOLN
INTRAVENOUS | Status: AC
  Filled 2019-11-25: qty 100

## 2019-11-25 MED ORDER — HEPARIN SOD (PORK) LOCK FLUSH 100 UNIT/ML IV SOLN
500.0000 [IU] | Freq: Once | INTRAVENOUS | Status: AC | PRN
  Administered 2019-11-25: 500 [IU]
  Filled 2019-11-25: qty 5

## 2019-11-25 MED ORDER — SODIUM CHLORIDE 0.9% FLUSH
10.0000 mL | Freq: Once | INTRAVENOUS | Status: AC | PRN
  Administered 2019-11-25: 10 mL
  Filled 2019-11-25: qty 10

## 2019-11-25 NOTE — Progress Notes (Signed)
[place] OFFICE PROGRESS NOTE  Patient Care Team: [name], MD as PCP - General [name], MD as Consulting Physician (Oncology) [name], MD as Consulting Physician (Neurosurgery) [name], MD as Consulting Physician (Hematology and Oncology) [name], [name], MD as Referring Physician (Hematology and Oncology)  ASSESSMENT & PLAN:  Multiple myeloma in relapse [facility]) She is awaiting for car T-cell treatment to proceed at [facility] in the near future She has no recent dental issues and is taking calcium with vitamin D We will proceed with Zometa as scheduled today   No orders of the defined types were placed in this encounter.   All questions were answered. The patient knows to call the clinic with any problems, questions or concerns. The total time spent in the appointment was 15 minutes encounter with patients including review of chart and various tests results, discussions about plan of care and coordination of care plan   [name], MD 11/25/2019 1:34 PM  INTERVAL HISTORY: Please see below for problem oriented charting. She returns for further follow-up She is undergoing treatment at [facility] with plan for car T-cell in the future She developed eye toxicity after recent treatment with Blenrep but according to the patient, her vision has improved and denies further dryness in her eyes of pain  SUMMARY OF ONCOLOGIC HISTORY: Oncology History  Multiple myeloma in relapse ([place])  07/21/2007 Bone Marrow Biopsy   Case #: [id number] Bone marrow showed myeloma   07/21/2007 Miscellaneous   She was diagnosed in May 2009. Had several cycles of RVD   10/14/2007 Bone Marrow Biopsy   Case #: [id number] repeat bone marrow biopsy showed good response to Rx   12/31/2007 Bone Marrow Transplant   She had autologous BMT at [facility]   07/19/2009 Bone Marrow Biopsy   Case #: [id number] Bone marrow biopsy showed only 1% plasma cells   10/10/2010 Bone  Marrow Biopsy   [id number] Bone marrow biopsy showed 2% plasma cells   09/25/2011 Bone Marrow Biopsy   Accession #: [id number] Bone marrow only showed 4% plasma cells with ligh chain excess   10/14/2011 - 06/13/2013 Chemotherapy   She received Revlimid only, discontinued due to pancytopenia   06/18/2015 - 09/13/2015 Chemotherapy   She resumed taking Revlimid and dexamethasone. Treatment is stopped due to minimum response and pancytopenia   10/19/2015 -  Chemotherapy   The patient had Velcade for chemotherapy treatment along with weekly Daratumumab. Last dose of Velcade on 02/29/16, stopped due to progression. Pomalidomide added on 03/14/16   04/11/2016 Adverse Reaction   Delay resumption of Pomalyst cycle 2 until 04/16/16 due to neutropenia   09/16/2016 PET scan   1. No findings of active bony lymphoma. Prior activity in the sternum and thoracic spine has resolved. 2. Focal hypermetabolic activity along the sigmoid colon with some local inflammatory stranding in the adjacent mesentery, maximum SUV 11.0. This is probably from mild acute diverticulitis. There is no hypermetabolic activity in this vicinity 4 months ago to suggest that this is from colon cancer. Correlate with any symptoms. 3. Acute right maxillary sinusitis. 4. Thoracolumbar compression fractures, vertebral augmentation at the L1 level.   09/17/2016 Procedure   CT guided bone marrow biopsy of right posterior iliac bone with both aspirate and core samples obtained.   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 - TRILINEAGE HEMATOPOIESIS. - SEE COMMENT. PERIPHERAL BLOOD: -  LYMPHOPENIA. - THROMBOCYTOPENIA. Diagnosis Note The bone marrow is slightly hypocellular for age with trilineage hematopoiesis and non-specific myeloid changes likely related to previous therapy. Plasma cells are increased in number representing 8% of all cells  in the aspirate, but with lack of large aggregates or sheets in the clot and biopsy sections. Immunohistochemical stains highlight the slightly increased plasma cell component in the marrow which shows kappa light chain restriction consistent with residual/recurrent plasma cell neoplasm. Correlation with cytogenetic and FISH studies recommended   09/17/2016 Pathology Results   Normal bone marrow cytogenetics and FISH   04/06/2018 PET scan   1. No abnormal focus of increased radiotracer uptake identified to suggest active disease or progression of disease. 2. Similar appearance of thoracic and lumbar compression deformities with associated kyphosis deformity. 3. Thickening of the endometrium is again noted. This is indeterminate but is considered abnormal in a postmenopausal female. Consider further evaluation with pelvic sonogram.    12/15/2018 Echocardiogram   IMPRESSIONS      1. Left ventricular ejection fraction, by visual estimation, is 55 to 60%. The left ventricle has normal function. Normal left ventricular size. There is no left ventricular hypertrophy.  2. Global right ventricle has normal systolic function.The right ventricular size is mildly enlarged. No increase in right ventricular wall thickness.  3. Left atrial size was normal.  4. Right atrial size was normal.  5. The mitral valve is normal in structure. No evidence of mitral valve regurgitation. No evidence of mitral stenosis.  6. The tricuspid valve is normal in structure. Tricuspid valve regurgitation is mild.  7. The aortic valve is normal in structure. Aortic valve regurgitation was not visualized by color flow Doppler. Structurally normal aortic valve, with no evidence of sclerosis or stenosis.  8. The pulmonic valve was normal in structure. Pulmonic valve regurgitation is not visualized by color flow Doppler.  9. The inferior vena cava is normal in size with greater than 50% respiratory variability, suggesting right  atrial pressure of 3 mmHg. 10. The average left ventricular global longitudinal strain is 19.2 %.       REVIEW OF SYSTEMS:   Constitutional: Denies fevers, chills or abnormal weight loss Eyes: Denies blurriness of vision Ears, nose, mouth, throat, and face: Denies mucositis or sore throat Respiratory: Denies cough, dyspnea or wheezes Cardiovascular: Denies palpitation, chest discomfort or lower extremity swelling Gastrointestinal:  Denies nausea, heartburn or change in bowel habits Skin: Denies abnormal skin rashes Lymphatics: Denies new lymphadenopathy or easy bruising Neurological:Denies numbness, tingling or new weaknesses Behavioral/Psych: Mood is stable, no new changes  All other systems were reviewed with the patient and are negative.  I have reviewed the past medical history, past surgical history, social history and family history with the patient and they are unchanged from previous note.  ALLERGIES:  is allergic to hydromorphone.  MEDICATIONS:  Current Outpatient Medications  Medication Sig Dispense Refill   aspirin 81 MG tablet Take 81 mg by mouth.     cholecalciferol (VITAMIN D) 1000 UNITS tablet Take 1,000 Units by mouth daily.     clonazePAM (KLONOPIN) 0.5 MG tablet Take 1 tablet (0.5 mg total) by mouth 2 (two) times daily as needed for anxiety. 30 tablet 0   Multiple Vitamins-Minerals (ONE-A-DAY 50 PLUS PO) Take by mouth daily.     ondansetron (ZOFRAN) 8 MG tablet Take 1 tablet (8 mg total) by mouth every 8 (eight) hours as needed for nausea. 30 tablet 3   oxycodone (OXY-IR) 5 MG capsule Take 5  mg by mouth every 4 (four) hours as needed. pain     Oxycodone HCl (OXYCONTIN) 60 MG TB12 Take 1 tablet by mouth 2 (two) times daily.     pantoprazole (PROTONIX) 40 MG tablet TAKE (1) TABLET DAILY AS NEEDED. 90 tablet 11   prochlorperazine (COMPAZINE) 10 MG tablet Take 1 tablet (10 mg total) by mouth every 6 (six) hours as needed for nausea or vomiting. (Patient not  taking: Reported on 02/25/2019) 30 tablet 1   valACYclovir (VALTREX) 1000 MG tablet TAKE 1 TABLET ONCE DAILY. 30 tablet 11   No current facility-administered medications for this visit.   Facility-Administered Medications Ordered in Other Visits  Medication Dose Route Frequency Provider Last Rate Last Admin   heparin lock flush 100 unit/mL  500 Units Intracatheter Once PRN Alvy Bimler, Lacrisha Bielicki, MD       sodium chloride flush (NS) 0.9 % injection 10 mL  10 mL Intracatheter Once PRN [name], MD        PHYSICAL EXAMINATION: ECOG PERFORMANCE STATUS: 0 - Asymptomatic  Vitals:   11/25/19 1256  BP: 136/81  Pulse: 82  Resp: 18  Temp: 98.5 F (36.9 C)  SpO2: 100%   Filed Weights   11/25/19 1256  Weight: 113 lb (51.3 kg)    GENERAL:alert, no distress and comfortable SKIN: skin color, texture, turgor are normal, no rashes or significant lesions EYES: normal, Conjunctiva are pink and non-injected, sclera clear OROPHARYNX:no exudate, no erythema and lips, buccal mucosa, and tongue normal  NECK: supple, thyroid normal size, non-tender, without nodularity LYMPH:  no palpable lymphadenopathy in the cervical, axillary or inguinal LUNGS: clear to auscultation and percussion with normal breathing effort HEART: regular rate & rhythm and no murmurs and no lower extremity edema ABDOMEN:abdomen soft, non-tender and normal bowel sounds Musculoskeletal:no cyanosis of digits and no clubbing  NEURO: alert & oriented x 3 with fluent speech, no focal motor/sensory deficits  LABORATORY DATA:  I have reviewed the data as listed    Component Value Date/Time   NA 142 11/25/2019 1235   NA 140 03/11/2017 1150   K 4.0 11/25/2019 1235   K 3.9 03/11/2017 1150   CL 106 11/25/2019 1235   CL 107 08/30/2012 1055   CO2 29 11/25/2019 1235   CO2 29 03/11/2017 1150   GLUCOSE 100 (H) 11/25/2019 1235   GLUCOSE 93 03/11/2017 1150   GLUCOSE 102 (H) 08/30/2012 1055   BUN 21 (H) 11/25/2019 1235   BUN 12.5  03/11/2017 1150   CREATININE 0.79 11/25/2019 1235   CREATININE 0.77 07/23/2018 0808   CREATININE 0.8 03/11/2017 1150   CALCIUM 9.1 11/25/2019 1235   CALCIUM 8.7 03/11/2017 1150   PROT 6.0 (L) 11/25/2019 1235   PROT 6.0 (L) 03/11/2017 1150   ALBUMIN 4.0 11/25/2019 1235   ALBUMIN 3.8 03/11/2017 1150   AST 29 11/25/2019 1235   AST 21 07/23/2018 0808   AST 18 03/11/2017 1150   ALT 17 11/25/2019 1235   ALT 16 07/23/2018 0808   ALT 14 03/11/2017 1150   ALKPHOS 68 11/25/2019 1235   ALKPHOS 45 03/11/2017 1150   BILITOT 0.4 11/25/2019 1235   BILITOT 0.5 07/23/2018 0808   BILITOT 0.37 03/11/2017 1150   GFRNONAA >60 11/25/2019 1235   GFRNONAA >60 07/23/2018 0808   GFRAA >60 11/25/2019 1235   GFRAA >60 07/23/2018 0808    No results found for: SPEP, UPEP  Lab Results  Component Value Date   WBC 5.8 11/25/2019   NEUTROABS 4.5 11/25/2019  HGB 12.4 11/25/2019   HCT 37.3 11/25/2019   MCV 106.9 (H) 11/25/2019   PLT 117 (L) 11/25/2019      Chemistry      Component Value Date/Time   NA 142 11/25/2019 1235   NA 140 03/11/2017 1150   K 4.0 11/25/2019 1235   K 3.9 03/11/2017 1150   CL 106 11/25/2019 1235   CL 107 08/30/2012 1055   CO2 29 11/25/2019 1235   CO2 29 03/11/2017 1150   BUN 21 (H) 11/25/2019 1235   BUN 12.5 03/11/2017 1150   CREATININE 0.79 11/25/2019 1235   CREATININE 0.77 07/23/2018 0808   CREATININE 0.8 03/11/2017 1150      Component Value Date/Time   CALCIUM 9.1 11/25/2019 1235   CALCIUM 8.7 03/11/2017 1150   ALKPHOS 68 11/25/2019 1235   ALKPHOS 45 03/11/2017 1150   AST 29 11/25/2019 1235   AST 21 07/23/2018 0808   AST 18 03/11/2017 1150   ALT 17 11/25/2019 1235   ALT 16 07/23/2018 0808   ALT 14 03/11/2017 1150   BILITOT 0.4 11/25/2019 1235   BILITOT 0.5 07/23/2018 0808   BILITOT 0.37 03/11/2017 1150

## 2019-11-25 NOTE — Patient Instructions (Signed)
Zoledronic Acid injection (Hypercalcemia, Oncology) What is this medicine? ZOLEDRONIC ACID (ZOE le dron ik AS id) lowers the amount of calcium loss from bone. It is used to treat too much calcium in your blood from cancer. It is also used to prevent complications of cancer that has spread to the bone. This medicine may be used for other purposes; ask your health care provider or pharmacist if you have questions. COMMON BRAND NAME(S): Zometa What should I tell my health care provider before I take this medicine? They need to know if you have any of these conditions:  aspirin-sensitive asthma  cancer, especially if you are receiving medicines used to treat cancer  dental disease or wear dentures  infection  kidney disease  receiving corticosteroids like dexamethasone or prednisone  an unusual or allergic reaction to zoledronic acid, other medicines, foods, dyes, or preservatives  pregnant or trying to get pregnant  breast-feeding How should I use this medicine? This medicine is for infusion into a vein. It is given by a health care professional in a hospital or clinic setting. Talk to your pediatrician regarding the use of this medicine in children. Special care may be needed. Overdosage: If you think you have taken too much of this medicine contact a poison control center or emergency room at once. NOTE: This medicine is only for you. Do not share this medicine with others. What if I miss a dose? It is important not to miss your dose. Call your doctor or health care professional if you are unable to keep an appointment. What may interact with this medicine?  certain antibiotics given by injection  NSAIDs, medicines for pain and inflammation, like ibuprofen or naproxen  some diuretics like bumetanide, furosemide  teriparatide  thalidomide This list may not describe all possible interactions. Give your health care provider a list of all the medicines, herbs, non-prescription  drugs, or dietary supplements you use. Also tell them if you smoke, drink alcohol, or use illegal drugs. Some items may interact with your medicine. What should I watch for while using this medicine? Visit your doctor or health care professional for regular checkups. It may be some time before you see the benefit from this medicine. Do not stop taking your medicine unless your doctor tells you to. Your doctor may order blood tests or other tests to see how you are doing. Women should inform their doctor if they wish to become pregnant or think they might be pregnant. There is a potential for serious side effects to an unborn child. Talk to your health care professional or pharmacist for more information. You should make sure that you get enough calcium and vitamin D while you are taking this medicine. Discuss the foods you eat and the vitamins you take with your health care professional. Some people who take this medicine have severe bone, joint, and/or muscle pain. This medicine may also increase your risk for jaw problems or a broken thigh bone. Tell your doctor right away if you have severe pain in your jaw, bones, joints, or muscles. Tell your doctor if you have any pain that does not go away or that gets worse. Tell your dentist and dental surgeon that you are taking this medicine. You should not have major dental surgery while on this medicine. See your dentist to have a dental exam and fix any dental problems before starting this medicine. Take good care of your teeth while on this medicine. Make sure you see your dentist for regular follow-up   appointments. What side effects may I notice from receiving this medicine? Side effects that you should report to your doctor or health care professional as soon as possible:  allergic reactions like skin rash, itching or hives, swelling of the face, lips, or tongue  anxiety, confusion, or depression  breathing problems  changes in vision  eye  pain  feeling faint or lightheaded, falls  jaw pain, especially after dental work  mouth sores  muscle cramps, stiffness, or weakness  redness, blistering, peeling or loosening of the skin, including inside the mouth  trouble passing urine or change in the amount of urine Side effects that usually do not require medical attention (report to your doctor or health care professional if they continue or are bothersome):  bone, joint, or muscle pain  constipation  diarrhea  fever  hair loss  irritation at site where injected  loss of appetite  nausea, vomiting  stomach upset  trouble sleeping  trouble swallowing  weak or tired This list may not describe all possible side effects. Call your doctor for medical advice about side effects. You may report side effects to FDA at 1-800-FDA-1088. Where should I keep my medicine? This drug is given in a hospital or clinic and will not be stored at home. NOTE: This sheet is a summary. It may not cover all possible information. If you have questions about this medicine, talk to your doctor, pharmacist, or health care provider.  2020 Elsevier/Gold Standard (2013-07-23 14:19:39)  

## 2019-11-25 NOTE — Assessment & Plan Note (Signed)
She is awaiting for car T-cell treatment to proceed at [facility] in the near future She has no recent dental issues and is taking calcium with vitamin D We will proceed with Zometa as scheduled today

## 2019-11-25 NOTE — Progress Notes (Signed)
Per Dr. Alvy Bimler, ok to today with pending labs.

## 2019-11-25 NOTE — Patient Instructions (Signed)

## 2020-05-03 ENCOUNTER — Telehealth: Payer: Self-pay

## 2020-05-03 ENCOUNTER — Other Ambulatory Visit: Payer: Self-pay | Admitting: Hematology and Oncology

## 2020-05-03 NOTE — Telephone Encounter (Signed)
Called back and scheduling message sent. She is aware to expect call from scheduler.

## 2020-05-03 NOTE — Telephone Encounter (Signed)
I can see her either 3/17 or 3/18 but would be afternoon Can you arrange labs,flush and zometa in special section? I can see her there

## 2020-05-03 NOTE — Telephone Encounter (Signed)
She called and left a message. She is asking for appt for IV Zometa. She is due in March. She is asking for appt for 3/17 or 3/18.

## 2020-05-09 ENCOUNTER — Telehealth: Payer: Self-pay

## 2020-05-09 NOTE — Telephone Encounter (Signed)
Called and reviewed 3/16 appts. She verbalized understanding.

## 2020-05-23 ENCOUNTER — Inpatient Hospital Stay: Payer: 59 | Admitting: Hematology and Oncology

## 2020-05-23 ENCOUNTER — Ambulatory Visit: Payer: 59 | Admitting: Hematology and Oncology

## 2020-05-23 ENCOUNTER — Inpatient Hospital Stay: Payer: 59 | Attending: Hematology and Oncology

## 2020-05-23 ENCOUNTER — Other Ambulatory Visit: Payer: 59

## 2020-05-23 ENCOUNTER — Other Ambulatory Visit: Payer: Self-pay

## 2020-05-23 ENCOUNTER — Other Ambulatory Visit: Payer: Self-pay | Admitting: Hematology and Oncology

## 2020-05-23 ENCOUNTER — Inpatient Hospital Stay: Payer: 59

## 2020-05-23 VITALS — BP 128/80 | Resp 18

## 2020-05-23 DIAGNOSIS — C9002 Multiple myeloma in relapse: Secondary | ICD-10-CM

## 2020-05-23 DIAGNOSIS — C9001 Multiple myeloma in remission: Secondary | ICD-10-CM

## 2020-05-23 DIAGNOSIS — D61810 Antineoplastic chemotherapy induced pancytopenia: Secondary | ICD-10-CM | POA: Insufficient documentation

## 2020-05-23 DIAGNOSIS — Z79899 Other long term (current) drug therapy: Secondary | ICD-10-CM | POA: Diagnosis not present

## 2020-05-23 DIAGNOSIS — M549 Dorsalgia, unspecified: Secondary | ICD-10-CM

## 2020-05-23 DIAGNOSIS — T451X5A Adverse effect of antineoplastic and immunosuppressive drugs, initial encounter: Secondary | ICD-10-CM | POA: Diagnosis not present

## 2020-05-23 DIAGNOSIS — Z95828 Presence of other vascular implants and grafts: Secondary | ICD-10-CM

## 2020-05-23 DIAGNOSIS — G8929 Other chronic pain: Secondary | ICD-10-CM

## 2020-05-23 LAB — COMPREHENSIVE METABOLIC PANEL
ALT: 22 U/L (ref 0–44)
AST: 31 U/L (ref 15–41)
Albumin: 4.4 g/dL (ref 3.5–5.0)
Alkaline Phosphatase: 60 U/L (ref 38–126)
Anion gap: 9 (ref 5–15)
BUN: 16 mg/dL (ref 6–20)
CO2: 28 mmol/L (ref 22–32)
Calcium: 8.6 mg/dL — ABNORMAL LOW (ref 8.9–10.3)
Chloride: 103 mmol/L (ref 98–111)
Creatinine, Ser: 0.79 mg/dL (ref 0.44–1.00)
GFR, Estimated: >60 mL/min (ref >60)
Glucose, Bld: 102 mg/dL — ABNORMAL HIGH (ref 70–99)
Potassium: 3.5 mmol/L (ref 3.5–5.1)
Sodium: 140 mmol/L (ref 135–145)
Total Bilirubin: 0.6 mg/dL (ref 0.3–1.2)
Total Protein: 6 g/dL — ABNORMAL LOW (ref 6.5–8.1)

## 2020-05-23 LAB — CBC WITH DIFFERENTIAL/PLATELET
Abs Immature Granulocytes: 0 10*3/uL (ref 0.00–0.07)
Basophils Absolute: 0 10*3/uL (ref 0.0–0.1)
Basophils Relative: 1 %
Eosinophils Absolute: 0.2 10*3/uL (ref 0.0–0.5)
Eosinophils Relative: 5 %
HCT: 34.8 % — ABNORMAL LOW (ref 36.0–46.0)
Hemoglobin: 12.1 g/dL (ref 12.0–15.0)
Immature Granulocytes: 0 %
Lymphocytes Relative: 16 %
Lymphs Abs: 0.6 10*3/uL — ABNORMAL LOW (ref 0.7–4.0)
MCH: 37.2 pg — ABNORMAL HIGH (ref 26.0–34.0)
MCHC: 34.8 g/dL (ref 30.0–36.0)
MCV: 107.1 fL — ABNORMAL HIGH (ref 80.0–100.0)
Monocytes Absolute: 0.4 10*3/uL (ref 0.1–1.0)
Monocytes Relative: 10 %
Neutro Abs: 2.5 10*3/uL (ref 1.7–7.7)
Neutrophils Relative %: 68 %
Platelets: 130 10*3/uL — ABNORMAL LOW (ref 150–400)
RBC: 3.25 MIL/uL — ABNORMAL LOW (ref 3.87–5.11)
RDW: 11.1 % — ABNORMAL LOW (ref 11.5–15.5)
WBC: 3.6 10*3/uL — ABNORMAL LOW (ref 4.0–10.5)
nRBC: 0 % (ref 0.0–0.2)

## 2020-05-23 MED ORDER — SODIUM CHLORIDE 0.9% FLUSH
10.0000 mL | Freq: Once | INTRAVENOUS | Status: AC | PRN
  Administered 2020-05-23: 10 mL
  Filled 2020-05-23: qty 10

## 2020-05-23 MED ORDER — ZOLEDRONIC ACID 4 MG/100ML IV SOLN
INTRAVENOUS | Status: AC
  Filled 2020-05-23: qty 100

## 2020-05-23 MED ORDER — SODIUM CHLORIDE 0.9 % IV SOLN
Freq: Once | INTRAVENOUS | Status: AC
Start: 2020-05-23 — End: 2020-05-23
  Filled 2020-05-23: qty 250

## 2020-05-23 MED ORDER — HEPARIN SOD (PORK) LOCK FLUSH 100 UNIT/ML IV SOLN
250.0000 [IU] | Freq: Once | INTRAVENOUS | Status: AC | PRN
  Administered 2020-05-23: 250 [IU]
  Filled 2020-05-23: qty 5

## 2020-05-23 MED ORDER — ZOLEDRONIC ACID 4 MG/100ML IV SOLN
4.0000 mg | Freq: Once | INTRAVENOUS | Status: AC
  Administered 2020-05-23: 4 mg via INTRAVENOUS

## 2020-05-23 NOTE — Progress Notes (Signed)
Okay to proceed with Zometa treatment with calcium of 8.6 per Dr. Alvy Bimler.

## 2020-05-23 NOTE — Patient Instructions (Signed)
Zoledronic Acid Injection (Hypercalcemia, Oncology) What is this medicine? ZOLEDRONIC ACID (ZOE le dron ik AS id) slows calcium loss from bones. It high calcium levels in the blood from some kinds of cancer. It may be used in other people at risk for bone loss. This medicine may be used for other purposes; ask your health care provider or pharmacist if you have questions. COMMON BRAND NAME(S): Zometa What should I tell my health care provider before I take this medicine? They need to know if you have any of these conditions:  cancer  dehydration  dental disease  kidney disease  liver disease  low levels of calcium in the blood  lung or breathing disease (asthma)  receiving steroids like dexamethasone or prednisone  an unusual or allergic reaction to zoledronic acid, other medicines, foods, dyes, or preservatives  pregnant or trying to get pregnant  breast-feeding How should I use this medicine? This drug is injected into a vein. It is given by a health care provider in a hospital or clinic setting. Talk to your health care provider about the use of this drug in children. Special care may be needed. Overdosage: If you think you have taken too much of this medicine contact a poison control center or emergency room at once. NOTE: This medicine is only for you. Do not share this medicine with others. What if I miss a dose? Keep appointments for follow-up doses. It is important not to miss your dose. Call your health care provider if you are unable to keep an appointment. What may interact with this medicine?  certain antibiotics given by injection  NSAIDs, medicines for pain and inflammation, like ibuprofen or naproxen  some diuretics like bumetanide, furosemide  teriparatide  thalidomide This list may not describe all possible interactions. Give your health care provider a list of all the medicines, herbs, non-prescription drugs, or dietary supplements you use. Also tell  them if you smoke, drink alcohol, or use illegal drugs. Some items may interact with your medicine. What should I watch for while using this medicine? Visit your health care provider for regular checks on your progress. It may be some time before you see the benefit from this drug. Some people who take this drug have severe bone, joint, or muscle pain. This drug may also increase your risk for jaw problems or a broken thigh bone. Tell your health care provider right away if you have severe pain in your jaw, bones, joints, or muscles. Tell you health care provider if you have any pain that does not go away or that gets worse. Tell your dentist and dental surgeon that you are taking this drug. You should not have major dental surgery while on this drug. See your dentist to have a dental exam and fix any dental problems before starting this drug. Take good care of your teeth while on this drug. Make sure you see your dentist for regular follow-up appointments. You should make sure you get enough calcium and vitamin D while you are taking this drug. Discuss the foods you eat and the vitamins you take with your health care provider. Check with your health care provider if you have severe diarrhea, nausea, and vomiting, or if you sweat a lot. The loss of too much body fluid may make it dangerous for you to take this drug. You may need blood work done while you are taking this drug. Do not become pregnant while taking this drug. Women should inform their health care provider   if they wish to become pregnant or think they might be pregnant. There is potential for serious harm to an unborn child. Talk to your health care provider for more information. What side effects may I notice from receiving this medicine? Side effects that you should report to your doctor or health care provider as soon as possible:  allergic reactions (skin rash, itching or hives; swelling of the face, lips, or tongue)  bone  pain  infection (fever, chills, cough, sore throat, pain or trouble passing urine)  jaw pain, especially after dental work  joint pain  kidney injury (trouble passing urine or change in the amount of urine)  low blood pressure (dizziness; feeling faint or lightheaded, falls; unusually weak or tired)  low calcium levels (fast heartbeat; muscle cramps or pain; pain, tingling, or numbness in the hands or feet; seizures)  low magnesium levels (fast, irregular heartbeat; muscle cramp or pain; muscle weakness; tremors; seizures)  low red blood cell counts (trouble breathing; feeling faint; lightheaded, falls; unusually weak or tired)  muscle pain  redness, blistering, peeling, or loosening of the skin, including inside the mouth  severe diarrhea  swelling of the ankles, feet, hands  trouble breathing Side effects that usually do not require medical attention (report to your doctor or health care provider if they continue or are bothersome):  anxious  constipation  coughing  depressed mood  eye irritation, itching, or pain  fever  general ill feeling or flu-like symptoms  nausea  pain, redness, or irritation at site where injected  trouble sleeping This list may not describe all possible side effects. Call your doctor for medical advice about side effects. You may report side effects to FDA at 1-800-FDA-1088. Where should I keep my medicine? This drug is given in a hospital or clinic. It will not be stored at home. NOTE: This sheet is a summary. It may not cover all possible information. If you have questions about this medicine, talk to your doctor, pharmacist, or health care provider.  2021 Elsevier/Gold Standard (2018-12-09 09:13:00)  

## 2020-05-24 ENCOUNTER — Encounter: Payer: Self-pay | Admitting: Hematology and Oncology

## 2020-05-24 NOTE — Assessment & Plan Note (Signed)
She is not symptomatic Observe only for now 

## 2020-05-24 NOTE — Assessment & Plan Note (Signed)
I have reviewed her outside records She is s/p BCMA-directed CAR-T (Idecabtagene Vicleucel; ide-cel; Abecma), now undergoing monitoring She has achieved complete response She will continue close monitoring and follow-up at [facility] We will proceed with Zometa today She has no dental issues and I have verify she is taking calcium with vitamin D supplement She will likely need repeat infusion in 6 months and she will call me when it is due

## 2020-05-24 NOTE — Assessment & Plan Note (Signed)
She will continue chronic pain management as directed by her physician

## 2020-05-24 NOTE — Progress Notes (Signed)
[place] OFFICE PROGRESS NOTE  Patient Care Team: [name], MD as PCP - General [name], MD as Consulting Physician (Oncology) [name], MD as Consulting Physician (Neurosurgery) [name], MD as Consulting Physician (Hematology and Oncology) [name], [name], MD as Referring Physician (Hematology and Oncology)  ASSESSMENT & PLAN:  Multiple myeloma in remission [facility]) I have reviewed her outside records She is s/p BCMA-directed CAR-T (Idecabtagene Vicleucel; ide-cel; Abecma), now undergoing monitoring She has achieved complete response She will continue close monitoring and follow-up at [facility] We will proceed with Zometa today She has no dental issues and I have verify she is taking calcium with vitamin D supplement She will likely need repeat infusion in 6 months and she will call me when it is due   Pancytopenia due to antineoplastic chemotherapy ([place]) She is not symptomatic Observe only for now  Chronic back pain greater than 3 months duration She will continue chronic pain management as directed by her physician   No orders of the defined types were placed in this encounter.   All questions were answered. The patient knows to call the clinic with any problems, questions or concerns. The total time spent in the appointment was 20 minutes encounter with patients including review of chart and various tests results, discussions about plan of care and coordination of care plan   [name], MD 05/24/2020 7:47 AM  INTERVAL HISTORY: Please see below for problem oriented charting. She returns to receive Zometa infusion She tolerated recent car T-cell well and has complete response to treatment She had recent fractures of her bone but well controlled at this point in time with chronic pain management She denies dental issues  SUMMARY OF ONCOLOGIC HISTORY: Oncology History  Multiple myeloma in remission ([facility])  07/21/2007 Bone Marrow  Biopsy   Case #: [id number] Bone marrow showed myeloma   07/21/2007 Miscellaneous   She was diagnosed in May 2009. Had several cycles of RVD   10/14/2007 Bone Marrow Biopsy   Case #: [id number] repeat bone marrow biopsy showed good response to Rx   12/31/2007 Bone Marrow Transplant   She had autologous BMT at [facility]   07/19/2009 Bone Marrow Biopsy   Case #: [id number] Bone marrow biopsy showed only 1% plasma cells   10/10/2010 Bone Marrow Biopsy   [id number] Bone marrow biopsy showed 2% plasma cells   09/25/2011 Bone Marrow Biopsy   Accession #: [id number] Bone marrow only showed 4% plasma cells with ligh chain excess   10/14/2011 - 06/13/2013 Chemotherapy   She received Revlimid only, discontinued due to pancytopenia   06/18/2015 - 09/13/2015 Chemotherapy   She resumed taking Revlimid and dexamethasone. Treatment is stopped due to minimum response and pancytopenia   10/19/2015 -  Chemotherapy   The patient had Velcade for chemotherapy treatment along with weekly Daratumumab. Last dose of Velcade on 02/29/16, stopped due to progression. Pomalidomide added on 03/14/16   04/11/2016 Adverse Reaction   Delay resumption of Pomalyst cycle 2 until 04/16/16 due to neutropenia   09/16/2016 PET scan   1. No findings of active bony lymphoma. Prior activity in the sternum and thoracic spine has resolved. 2. Focal hypermetabolic activity along the sigmoid colon with some local inflammatory stranding in the adjacent mesentery, maximum SUV 11.0. This is probably from mild acute diverticulitis. There is no hypermetabolic activity in this vicinity 4 months ago to suggest that this is from colon cancer. Correlate with any symptoms. 3. Acute right maxillary sinusitis.  4. Thoracolumbar compression fractures, vertebral augmentation at the L1 level.   09/17/2016 Procedure   CT guided bone marrow biopsy of right posterior iliac bone with both aspirate and core samples obtained.   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 - TRILINEAGE HEMATOPOIESIS. - SEE COMMENT. PERIPHERAL BLOOD: - LYMPHOPENIA. - THROMBOCYTOPENIA. Diagnosis Note The bone marrow is slightly hypocellular for age with trilineage hematopoiesis and non-specific myeloid changes likely related to previous therapy. Plasma cells are increased in number representing 8% of all cells in the aspirate, but with lack of large aggregates or sheets in the clot and biopsy sections. Immunohistochemical stains highlight the slightly increased plasma cell component in the marrow which shows kappa light chain restriction consistent with residual/recurrent plasma cell neoplasm. Correlation with cytogenetic and FISH studies recommended   09/17/2016 Pathology Results   Normal bone marrow cytogenetics and FISH   04/06/2018 PET scan   1. No abnormal focus of increased radiotracer uptake identified to suggest active disease or progression of disease. 2. Similar appearance of thoracic and lumbar compression deformities with associated kyphosis deformity. 3. Thickening of the endometrium is again noted. This is indeterminate but is considered abnormal in a postmenopausal female. Consider further evaluation with pelvic sonogram.    12/15/2018 Echocardiogram   IMPRESSIONS      1. Left ventricular ejection fraction, by visual estimation, is 55 to 60%. The left ventricle has normal function. Normal left ventricular size. There is no left ventricular hypertrophy.  2. Global right ventricle has normal systolic function.The right ventricular size is mildly enlarged. No increase in right ventricular wall thickness.  3. Left atrial size was normal.  4. Right atrial size was normal.  5. The mitral valve is normal in structure. No evidence of mitral valve regurgitation. No evidence of mitral stenosis.  6. The tricuspid valve is normal in structure. Tricuspid  valve regurgitation is mild.  7. The aortic valve is normal in structure. Aortic valve regurgitation was not visualized by color flow Doppler. Structurally normal aortic valve, with no evidence of sclerosis or stenosis.  8. The pulmonic valve was normal in structure. Pulmonic valve regurgitation is not visualized by color flow Doppler.  9. The inferior vena cava is normal in size with greater than 50% respiratory variability, suggesting right atrial pressure of 3 mmHg. 10. The average left ventricular global longitudinal strain is 19.2 %.     05/24/2020 Cancer Staging   Staging form: Plasma Cell Myeloma and Plasma Cell Disorders, AJCC 8th Edition - Clinical stage from 05/24/2020: Beta-2-microglobulin (mg/L): 2, Albumin (g/dL): 4, ISS: Stage I, High-risk cytogenetics: Absent, LDH: Unknown - Signed by [name], MD on 05/24/2020 Stage prefix: Post-therapy Response to neoadjuvant therapy: Complete response Beta 2 microglobulin range (mg/L): Less than 3.5 Albumin range (g/dL): Greater than or equal to 3.5 Cytogenetics: t(11;14) translocation, Other mutation     REVIEW OF SYSTEMS:   Constitutional: Denies fevers, chills or abnormal weight loss Eyes: Denies blurriness of vision Ears, nose, mouth, throat, and face: Denies mucositis or sore throat Respiratory: Denies cough, dyspnea or wheezes Cardiovascular: Denies palpitation, chest discomfort or lower extremity swelling Gastrointestinal:  Denies nausea, heartburn or change in bowel habits Skin: Denies abnormal skin rashes Lymphatics: Denies new lymphadenopathy or easy bruising Neurological:Denies numbness, tingling or new weaknesses Behavioral/Psych: Mood is stable, no new changes  All other systems were reviewed with the patient and are negative.  I have  reviewed the past medical history, past surgical history, social history and family history with the patient and they are unchanged from previous note.  ALLERGIES:  is allergic to  hydromorphone.  MEDICATIONS:  Current Outpatient Medications  Medication Sig Dispense Refill  . cholecalciferol (VITAMIN D) 1000 UNITS tablet Take 1,000 Units by mouth daily.    . clonazePAM (KLONOPIN) 0.5 MG tablet Take 1 tablet (0.5 mg total) by mouth 2 (two) times daily as needed for anxiety. 30 tablet 0  . Multiple Vitamins-Minerals (ONE-A-DAY 50 PLUS PO) Take by mouth daily.    Marland Kitchen oxycodone (OXY-IR) 5 MG capsule Take 5 mg by mouth every 4 (four) hours as needed. pain    . oxyCODONE ER (XTAMPZA ER) 36 MG C12A TAKE ONE CAPSULE BY MOUTH EVERY 8 HOURS    . pantoprazole (PROTONIX) 40 MG tablet TAKE (1) TABLET DAILY AS NEEDED. 90 tablet 11  . valACYclovir (VALTREX) 1000 MG tablet TAKE 1 TABLET ONCE DAILY. 30 tablet 11   No current facility-administered medications for this visit.    PHYSICAL EXAMINATION: ECOG PERFORMANCE STATUS: 1 - Symptomatic but completely ambulatory GENERAL:alert, no distress and comfortable SKIN: skin color, texture, turgor are normal, no rashes or significant lesions EYES: normal, Conjunctiva are pink and non-injected, sclera clear OROPHARYNX: Noted osseous hypertrophy NECK: supple, thyroid normal size, non-tender, without nodularity LYMPH:  no palpable lymphadenopathy in the cervical, axillary or inguinal LUNGS: clear to auscultation and percussion with normal breathing effort HEART: regular rate & rhythm and no murmurs and no lower extremity edema ABDOMEN:abdomen soft, non-tender and normal bowel sounds Musculoskeletal:no cyanosis of digits and no clubbing  NEURO: alert & oriented x 3 with fluent speech, no focal motor/sensory deficits  LABORATORY DATA:  I have reviewed the data as listed    Component Value Date/Time   NA 140 05/23/2020 1409   NA 140 03/11/2017 1150   K 3.5 05/23/2020 1409   K 3.9 03/11/2017 1150   CL 103 05/23/2020 1409   CL 107 08/30/2012 1055   CO2 28 05/23/2020 1409   CO2 29 03/11/2017 1150   GLUCOSE 102 (H) 05/23/2020 1409    GLUCOSE 93 03/11/2017 1150   GLUCOSE 102 (H) 08/30/2012 1055   BUN 16 05/23/2020 1409   BUN 12.5 03/11/2017 1150   CREATININE 0.79 05/23/2020 1409   CREATININE 0.77 07/23/2018 0808   CREATININE 0.8 03/11/2017 1150   CALCIUM 8.6 (L) 05/23/2020 1409   CALCIUM 8.7 03/11/2017 1150   PROT 6.0 (L) 05/23/2020 1409   PROT 6.0 (L) 03/11/2017 1150   ALBUMIN 4.4 05/23/2020 1409   ALBUMIN 3.8 03/11/2017 1150   AST 31 05/23/2020 1409   AST 21 07/23/2018 0808   AST 18 03/11/2017 1150   ALT 22 05/23/2020 1409   ALT 16 07/23/2018 0808   ALT 14 03/11/2017 1150   ALKPHOS 60 05/23/2020 1409   ALKPHOS 45 03/11/2017 1150   BILITOT 0.6 05/23/2020 1409   BILITOT 0.5 07/23/2018 0808   BILITOT 0.37 03/11/2017 1150   GFRNONAA >60 05/23/2020 1409   GFRNONAA >60 07/23/2018 0808   GFRAA >60 11/25/2019 1235   GFRAA >60 07/23/2018 0808    No results found for: SPEP, UPEP  Lab Results  Component Value Date   WBC 3.6 (L) 05/23/2020   NEUTROABS 2.5 05/23/2020   HGB 12.1 05/23/2020   HCT 34.8 (L) 05/23/2020   MCV 107.1 (H) 05/23/2020   PLT 130 (L) 05/23/2020      Chemistry      Component Value  Date/Time   NA 140 05/23/2020 1409   NA 140 03/11/2017 1150   K 3.5 05/23/2020 1409   K 3.9 03/11/2017 1150   CL 103 05/23/2020 1409   CL 107 08/30/2012 1055   CO2 28 05/23/2020 1409   CO2 29 03/11/2017 1150   BUN 16 05/23/2020 1409   BUN 12.5 03/11/2017 1150   CREATININE 0.79 05/23/2020 1409   CREATININE 0.77 07/23/2018 0808   CREATININE 0.8 03/11/2017 1150      Component Value Date/Time   CALCIUM 8.6 (L) 05/23/2020 1409   CALCIUM 8.7 03/11/2017 1150   ALKPHOS 60 05/23/2020 1409   ALKPHOS 45 03/11/2017 1150   AST 31 05/23/2020 1409   AST 21 07/23/2018 0808   AST 18 03/11/2017 1150   ALT 22 05/23/2020 1409   ALT 16 07/23/2018 0808   ALT 14 03/11/2017 1150   BILITOT 0.6 05/23/2020 1409   BILITOT 0.5 07/23/2018 0808   BILITOT 0.37 03/11/2017 1150

## 2020-09-26 ENCOUNTER — Ambulatory Visit: Payer: Self-pay | Admitting: Podiatry

## 2020-10-19 IMAGING — DX DG CHEST 2V
2 series · 2 of 2 positions shown · non-contrast
Comparison: Chest x-ray of 03/11/2016

CLINICAL DATA: Low-grade fever, cough, hemoptysis, congestion over
the last 3 days, history of multiple myeloma

EXAM:
CHEST - 2 VIEW

[chest pa]
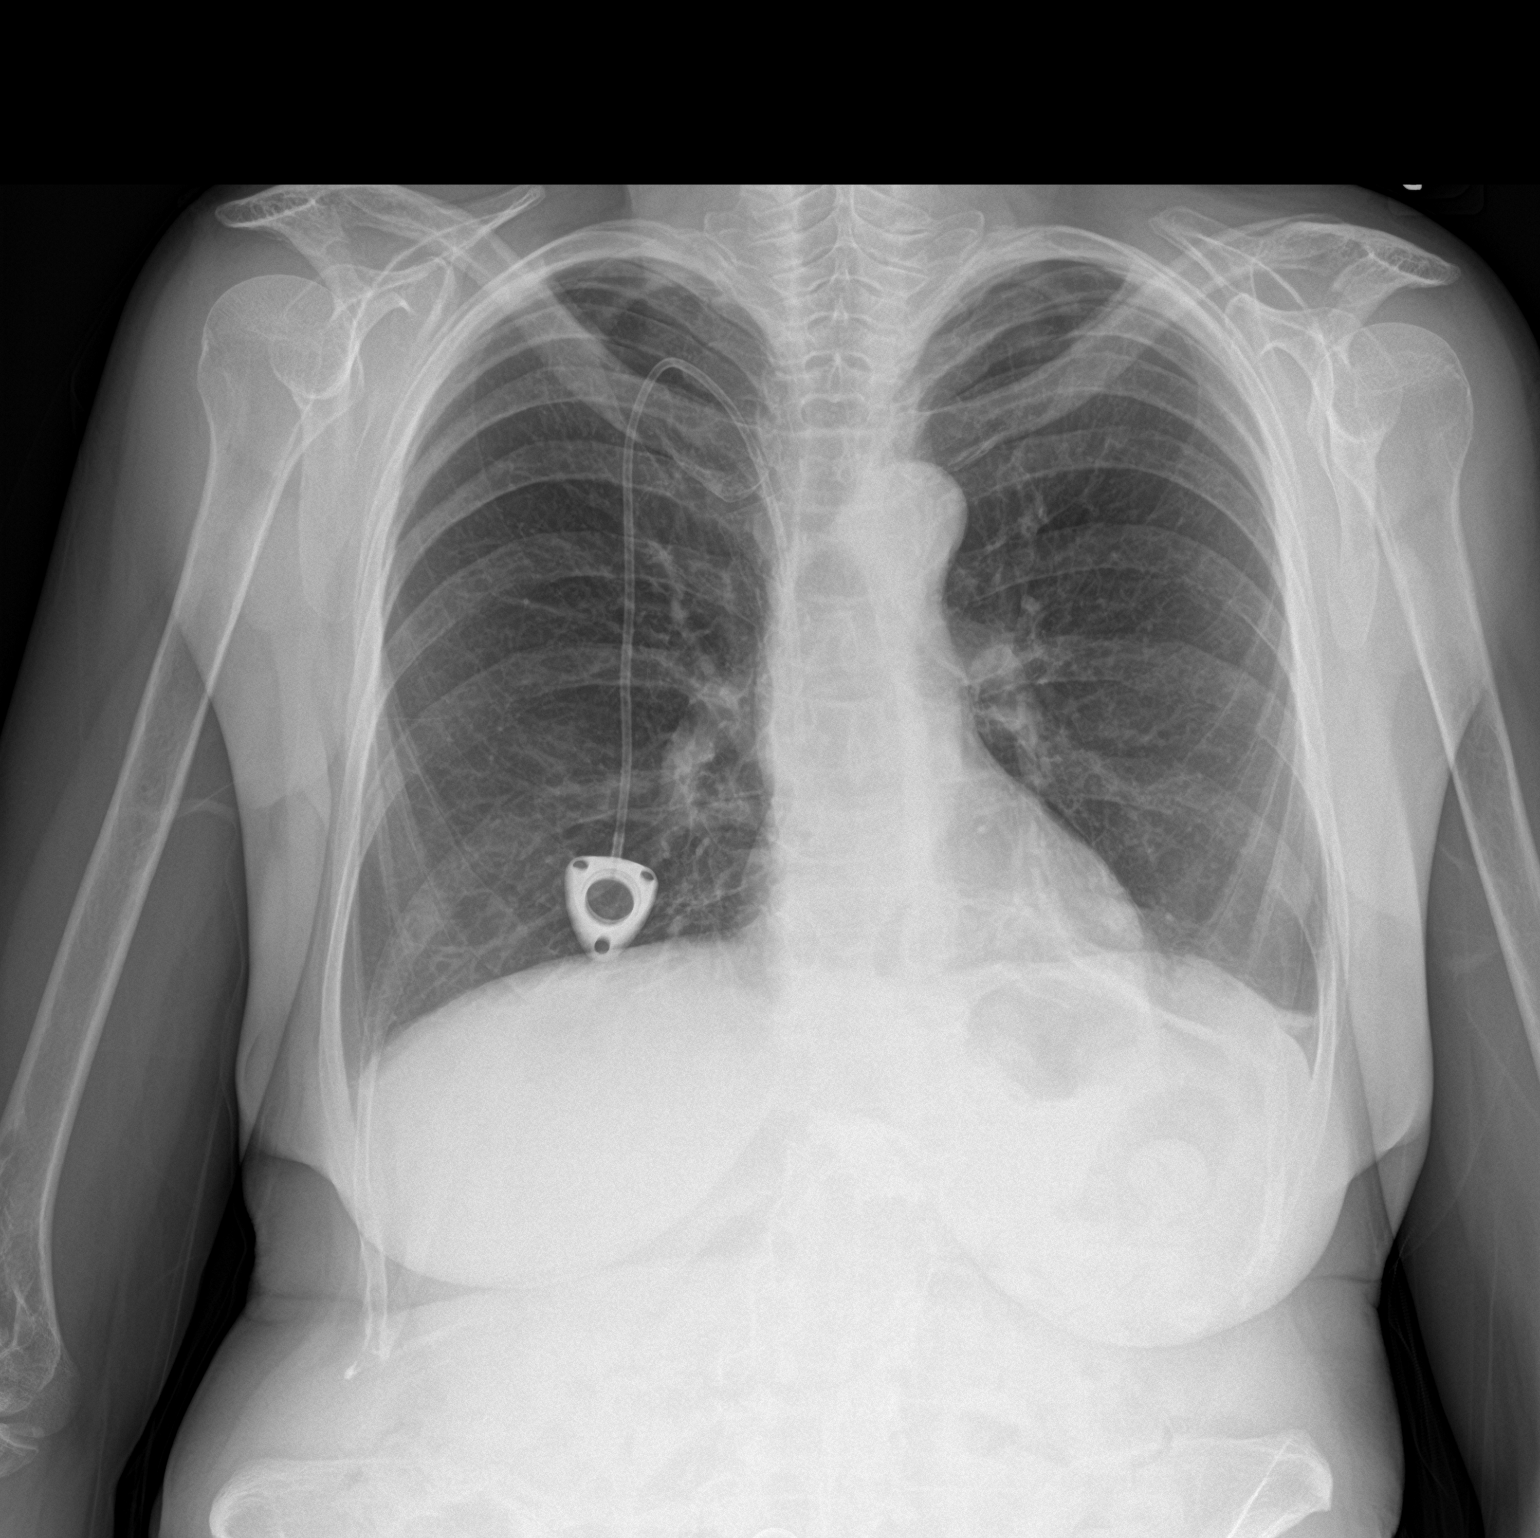

[chest lat]
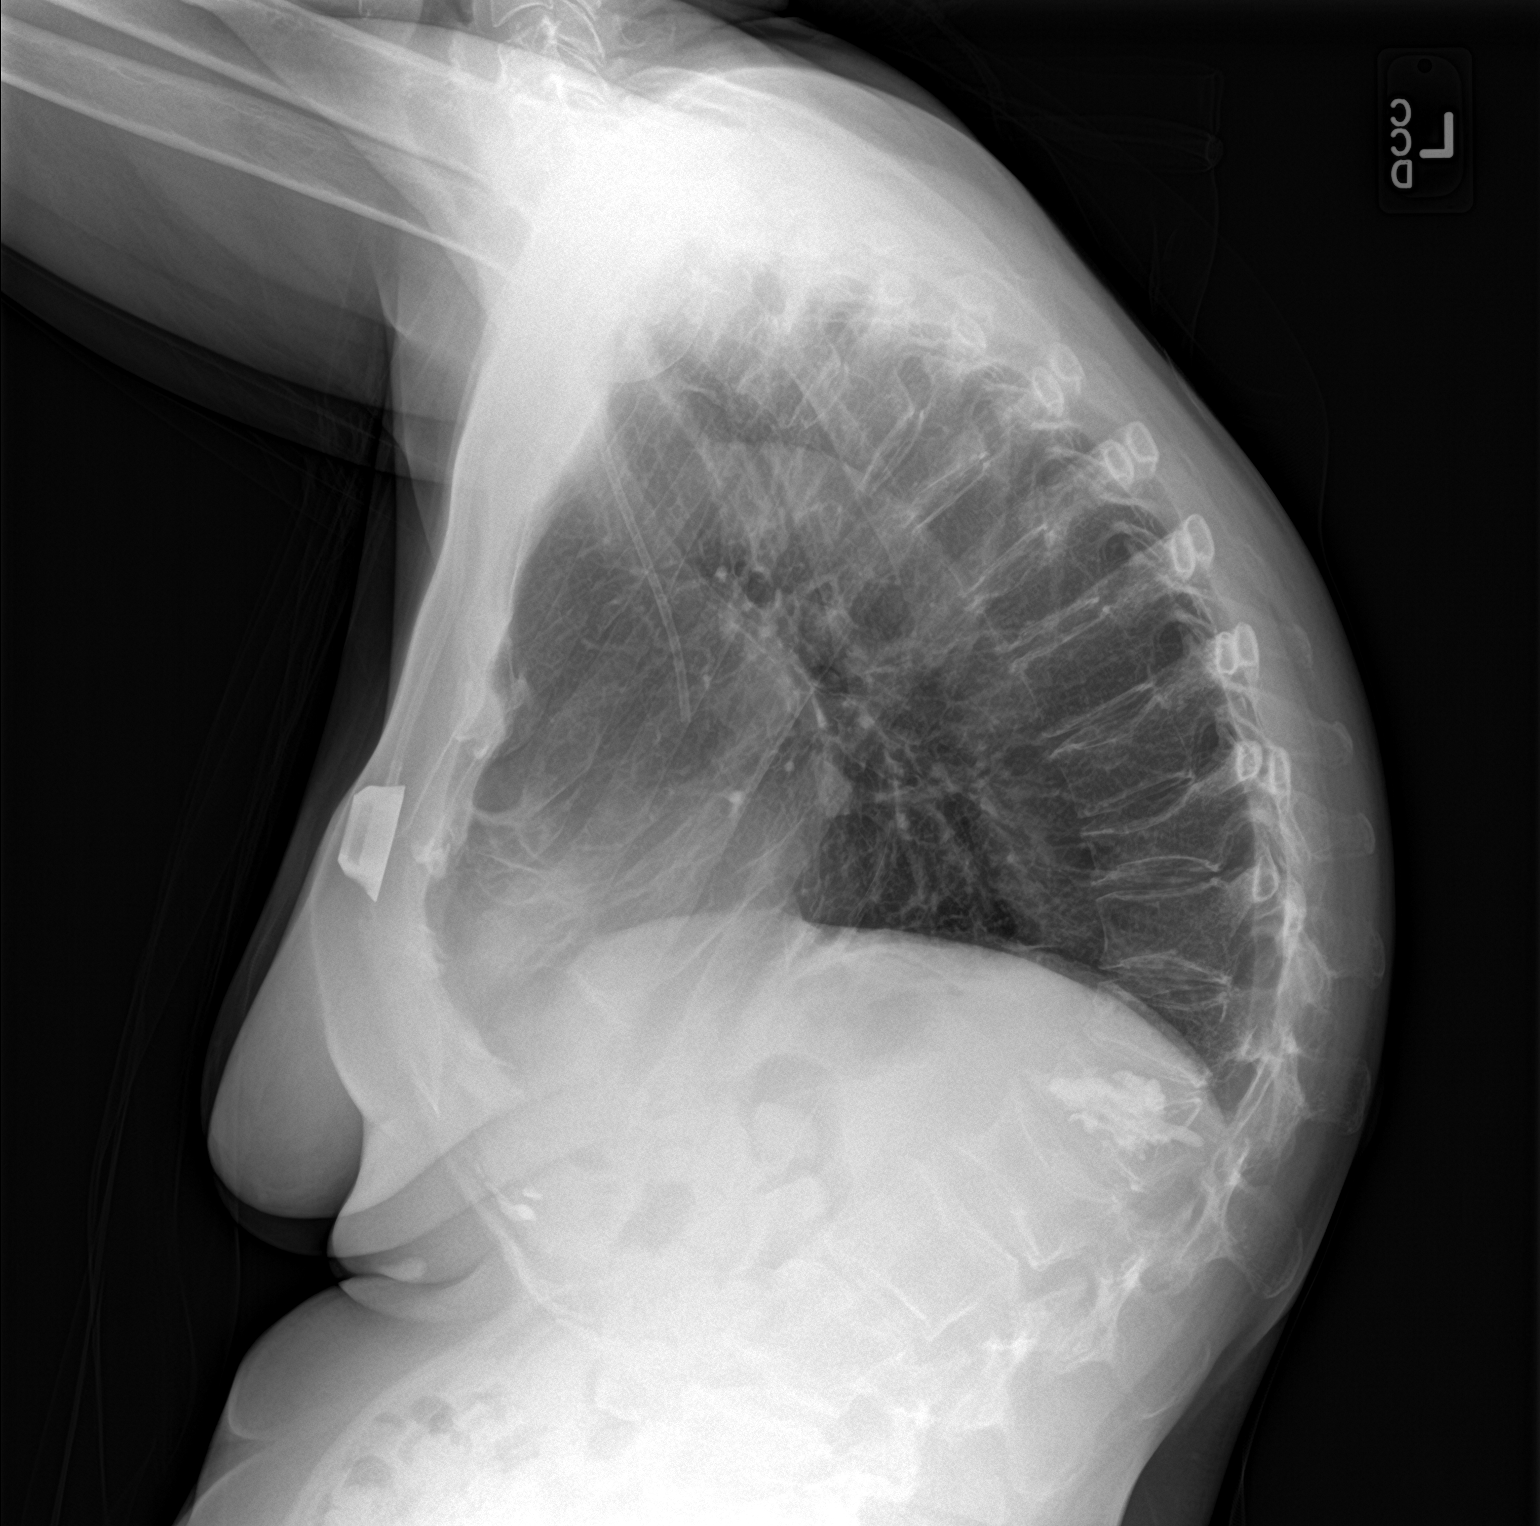

[2 of 2 positions shown; findings below may reference images not displayed]

FINDINGS: There has been linear scarring present previously at the left lung
base. However there are more prominent markings medially at the left
lung base overlying the heart shadow suggestive of left lower lobe
patchy pneumonia. The right lung is clear. Mediastinal and hilar
contours are stable. Right-sided Port-A-Cath tip overlies the mid
lower SVC and heart size is stable. There are thoracic and upper
lumbar compression deformities present and the bones are osteopenic
with thoracic kyphosis noted.
IMPRESSION: 1. More prominent markings medially at the left lung base may
represent patchy pneumonia. Recommend follow-up chest x-ray.
2. Stable linear scarring at the left lung base.
3. Osteopenia and thoracic kyphosis with stable thoracic and lumbar
vertebral compression deformities.

## 2021-02-12 ENCOUNTER — Telehealth: Payer: 59 | Admitting: Family Medicine

## 2021-02-12 ENCOUNTER — Telehealth: Payer: Self-pay

## 2021-02-12 DIAGNOSIS — R051 Acute cough: Secondary | ICD-10-CM

## 2021-02-12 NOTE — Telephone Encounter (Signed)
Returned husbands call. Kimberlyn has been sick since last Tuesday. Complaining of trouble breathing with burning pain. Low grade fever today and blood in sputum.  Instructed husband to take her to the urgent care. He verbalized understanding.

## 2021-02-12 NOTE — Progress Notes (Signed)
[place]  Needs UC or ED given Hx and symptoms. [facility] also advised this as well  Pt aware and so is husband who was on the visit as well

## 2021-10-21 ENCOUNTER — Inpatient Hospital Stay (HOSPITAL_COMMUNITY)
Admission: EM | Admit: 2021-10-21 | Discharge: 2021-10-25 | DRG: 481 | Disposition: A | Discharge status: Home Health | Payer: 59 | Attending: Internal Medicine | Admitting: Internal Medicine

## 2021-10-21 ENCOUNTER — Other Ambulatory Visit: Payer: Self-pay

## 2021-10-21 ENCOUNTER — Emergency Department (HOSPITAL_COMMUNITY): Payer: 59

## 2021-10-21 ENCOUNTER — Encounter (HOSPITAL_COMMUNITY): Payer: Self-pay | Admitting: Emergency Medicine

## 2021-10-21 DIAGNOSIS — S72141A Displaced intertrochanteric fracture of right femur, initial encounter for closed fracture: Secondary | ICD-10-CM | POA: Diagnosis present

## 2021-10-21 DIAGNOSIS — Z8781 Personal history of (healed) traumatic fracture: Secondary | ICD-10-CM

## 2021-10-21 DIAGNOSIS — D62 Acute posthemorrhagic anemia: Secondary | ICD-10-CM | POA: Diagnosis not present

## 2021-10-21 DIAGNOSIS — M1990 Unspecified osteoarthritis, unspecified site: Secondary | ICD-10-CM | POA: Diagnosis not present

## 2021-10-21 DIAGNOSIS — Z87440 Personal history of urinary (tract) infections: Secondary | ICD-10-CM | POA: Diagnosis present

## 2021-10-21 DIAGNOSIS — K219 Gastro-esophageal reflux disease without esophagitis: Secondary | ICD-10-CM | POA: Diagnosis present

## 2021-10-21 DIAGNOSIS — G894 Chronic pain syndrome: Secondary | ICD-10-CM | POA: Diagnosis present

## 2021-10-21 DIAGNOSIS — D696 Thrombocytopenia, unspecified: Secondary | ICD-10-CM | POA: Diagnosis present

## 2021-10-21 DIAGNOSIS — W1839XA Other fall on same level, initial encounter: Secondary | ICD-10-CM | POA: Diagnosis present

## 2021-10-21 DIAGNOSIS — D6959 Other secondary thrombocytopenia: Secondary | ICD-10-CM | POA: Diagnosis present

## 2021-10-21 DIAGNOSIS — D649 Anemia, unspecified: Secondary | ICD-10-CM | POA: Diagnosis not present

## 2021-10-21 DIAGNOSIS — Z8619 Personal history of other infectious and parasitic diseases: Secondary | ICD-10-CM | POA: Diagnosis present

## 2021-10-21 DIAGNOSIS — Z808 Family history of malignant neoplasm of other organs or systems: Secondary | ICD-10-CM | POA: Diagnosis not present

## 2021-10-21 DIAGNOSIS — D539 Nutritional anemia, unspecified: Secondary | ICD-10-CM | POA: Diagnosis present

## 2021-10-21 DIAGNOSIS — C9001 Multiple myeloma in remission: Secondary | ICD-10-CM | POA: Diagnosis present

## 2021-10-21 DIAGNOSIS — Z87891 Personal history of nicotine dependence: Secondary | ICD-10-CM

## 2021-10-21 DIAGNOSIS — Y9259 Other trade areas as the place of occurrence of the external cause: Secondary | ICD-10-CM | POA: Diagnosis not present

## 2021-10-21 DIAGNOSIS — S72001A Fracture of unspecified part of neck of right femur, initial encounter for closed fracture: Secondary | ICD-10-CM | POA: Diagnosis not present

## 2021-10-21 LAB — CBC WITH DIFFERENTIAL/PLATELET
Abs Immature Granulocytes: 0.03 10*3/uL (ref 0.00–0.07)
Basophils Absolute: 0 10*3/uL (ref 0.0–0.1)
Basophils Relative: 0 %
Eosinophils Absolute: 0 10*3/uL (ref 0.0–0.5)
Eosinophils Relative: 0 %
HCT: 35.6 % — ABNORMAL LOW (ref 36.0–46.0)
Hemoglobin: 12.3 g/dL (ref 12.0–15.0)
Immature Granulocytes: 0 %
Lymphocytes Relative: 4 %
Lymphs Abs: 0.3 10*3/uL — ABNORMAL LOW (ref 0.7–4.0)
MCH: 36.8 pg — ABNORMAL HIGH (ref 26.0–34.0)
MCHC: 34.6 g/dL (ref 30.0–36.0)
MCV: 106.6 fL — ABNORMAL HIGH (ref 80.0–100.0)
Monocytes Absolute: 0.4 10*3/uL (ref 0.1–1.0)
Monocytes Relative: 5 %
Neutro Abs: 7.1 10*3/uL (ref 1.7–7.7)
Neutrophils Relative %: 91 %
Platelets: 106 10*3/uL — ABNORMAL LOW (ref 150–400)
RBC: 3.34 MIL/uL — ABNORMAL LOW (ref 3.87–5.11)
RDW: 12 % (ref 11.5–15.5)
WBC: 7.9 10*3/uL (ref 4.0–10.5)
nRBC: 0 % (ref 0.0–0.2)

## 2021-10-21 LAB — COMPREHENSIVE METABOLIC PANEL
ALT: 16 U/L (ref 0–44)
AST: 31 U/L (ref 15–41)
Albumin: 4.1 g/dL (ref 3.5–5.0)
Alkaline Phosphatase: 42 U/L (ref 38–126)
Anion gap: 7 (ref 5–15)
BUN: 15 mg/dL (ref 8–23)
CO2: 27 mmol/L (ref 22–32)
Calcium: 8.6 mg/dL — ABNORMAL LOW (ref 8.9–10.3)
Chloride: 107 mmol/L (ref 98–111)
Creatinine, Ser: 0.85 mg/dL (ref 0.44–1.00)
GFR, Estimated: >60 mL/min (ref >60)
Glucose, Bld: 102 mg/dL — ABNORMAL HIGH (ref 70–99)
Potassium: 4.2 mmol/L (ref 3.5–5.1)
Sodium: 141 mmol/L (ref 135–145)
Total Bilirubin: 0.5 mg/dL (ref 0.3–1.2)
Total Protein: 5.8 g/dL — ABNORMAL LOW (ref 6.5–8.1)

## 2021-10-21 MED ORDER — HYDROMORPHONE HCL 1 MG/ML IJ SOLN: 1.0000 mg | Freq: Once | INTRAMUSCULAR | Status: DC

## 2021-10-21 MED ORDER — MORPHINE SULFATE (PF) 4 MG/ML IV SOLN
4.0000 mg | Freq: Once | INTRAVENOUS | Status: AC
  Administered 2021-10-21: 4 mg via INTRAVENOUS
  Filled 2021-10-21: qty 1

## 2021-10-21 MED ORDER — FENTANYL CITRATE PF 50 MCG/ML IJ SOSY
50.0000 ug | PREFILLED_SYRINGE | Freq: Once | INTRAMUSCULAR | Status: AC
  Administered 2021-10-21: 50 ug via INTRAVENOUS
  Filled 2021-10-21: qty 1

## 2021-10-21 MED ORDER — ONDANSETRON HCL 4 MG/2ML IJ SOLN
4.0000 mg | Freq: Once | INTRAMUSCULAR | Status: AC
  Administered 2021-10-21: 4 mg via INTRAVENOUS
  Filled 2021-10-21: qty 2

## 2021-10-21 NOTE — ED Notes (Signed)
Patient to xray.

## 2021-10-21 NOTE — ED Provider Notes (Signed)
[facility] DEPT Provider Note  CSN: [id number] Arrival date & time: 10/21/21 2041  Chief Complaint(s) Fall and Hip Injury  HPI Jessica Cochran is a 61 y.o. female with PMH multiple myeloma who presents emergency department for evaluation of a right hip injury.  Patient states that she was trying on shoes at a shoe store, missed the bench to sit down on and landed straight on her right hip.  She states that she felt "a crack" and was unable to move.  She denies numbness, tingling of the lower extremity.  Denies additional traumatic complaints.   Past Medical History Past Medical History:  Diagnosis Date   Arthritis    Cough 06/09/2014   Dysuria 06/09/2014   Herpes zoster infection 04/29/2011   Multiple myeloma in relapse ([place]) 10/12/2011   08/19/11: increase in kappa serum free light chains; 7/11: elevation urine protein (157 mg/24 hr) and reappearance monoclonal kappa light chains; 7/18: bone marrow biopsy 4% plasma cells; bone X-rays: no new lesions   Multiple myeloma in remission ([place]) 04/29/2011   Pelvic mass in female 08/28/2014   Renal stone    Yeast infection 06/08/2015   Patient Active Problem List   Diagnosis Date Noted   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   Palpitations with regular cardiac rhythm 01/14/2019   Encounter for antineoplastic chemotherapy 11/19/2018   Pancytopenia, acquired ([place]) 09/10/2017   Right leg swelling 07/24/2017   Lesion of colon 09/28/2016   Goals of care, counseling/discussion 08/27/2016   Drug-induced neutropenia ([place]) 04/11/2016   Abnormal bruising 03/14/2016   Port catheter in place 07/12/2015   Pancytopenia due to antineoplastic chemotherapy ([place]) 07/12/2015   Yeast infection 06/08/2015   Chronic back pain greater than 3 months duration 05/31/2015   Pelvic mass in female 08/28/2014   Chills (without fever) 06/11/2014   Rectal discharge 06/11/2014   Cough 06/09/2014   Dysuria 06/09/2014   History of recurrent  UTI (urinary tract infection) 10/26/2013   Hypogammaglobulinemia, 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 Authorizing Provider  cholecalciferol (VITAMIN D) 1000 UNITS tablet Take 1,000 Units by mouth daily.    [provider]  clonazePAM (KLONOPIN) 0.5 MG tablet Take 1 tablet (0.5 mg total) by mouth 2 (two) times daily as needed for anxiety. 02/25/19   Tanner, Lyndon Code., PA-C  Multiple Vitamins-Minerals (ONE-A-DAY 50 PLUS PO) Take by mouth daily.    [provider]  oxycodone (OXY-IR) 5 MG capsule Take 5 mg by mouth every 4 (four) hours as needed. pain    [provider]  oxyCODONE ER (XTAMPZA ER) 36 MG C12A TAKE ONE CAPSULE BY MOUTH EVERY 8 HOURS 04/19/20   [provider]  pantoprazole (PROTONIX) 40 MG tablet TAKE (1) TABLET DAILY AS NEEDED. 07/23/18   [name], MD  valACYclovir (VALTREX) 1000 MG tablet TAKE 1 TABLET ONCE DAILY. 09/29/18   [name], MD  Past Surgical History Past Surgical History:  Procedure Laterality Date   BACK SURGERY     2009   PILONIDAL CYST DRAINAGE  1991   rectal abcess     stem cell transplant autologus     uterine fibroid tumor removed     21's   Family History Family History  Problem Relation Age of Onset   Cancer Sister        skin cancer (melanoma)    Social History Social History   Tobacco Use   Smoking status: Former    Packs/day: 0.25    Years: 5.00    Total pack years: 1.25    Types: Cigarettes   Smokeless tobacco: Never  Substance Use Topics   Alcohol use: No   Drug use: No    Allergies Hydromorphone  Review of Systems Review of Systems  Musculoskeletal:  Positive for arthralgias and myalgias.    Physical Exam Vital Signs  I have reviewed the triage vital signs BP 128/75   Pulse 100   Temp 98.1 F (36.7 C) (Oral)   Resp 16   Ht 4' 9"  (1.448 m)   Wt 49.9 kg   SpO2 94%   BMI 23.80 kg/m   Physical Exam Vitals and nursing note reviewed.  Constitutional:      General: She is not in acute distress.    Appearance: She is well-developed.  HENT:     Head: Normocephalic and atraumatic.  Eyes:     Conjunctiva/sclera: Conjunctivae normal.  Cardiovascular:     Rate and Rhythm: Normal rate and regular rhythm.     Heart sounds: No murmur heard. Pulmonary:     Effort: Pulmonary effort is normal. No respiratory distress.     Breath sounds: Normal breath sounds.  Abdominal:     Palpations: Abdomen is soft.     Tenderness: There is no abdominal tenderness.  Musculoskeletal:        General: Swelling, tenderness, deformity and signs of injury present.     Cervical back: Neck supple.  Skin:    General: Skin is warm and dry.     Capillary Refill: Capillary refill takes less than 2 seconds.  Neurological:     Mental Status: She is alert.  Psychiatric:        Mood and Affect: Mood normal.     ED Results and Treatments Labs (all labs ordered are listed, but only abnormal results are displayed) Labs Reviewed  COMPREHENSIVE METABOLIC PANEL - Abnormal; Notable for the following components:      Result Value   Glucose, Bld 102 (*)    Calcium 8.6 (*)    Total Protein 5.8 (*)    All other components within normal limits  CBC WITH DIFFERENTIAL/PLATELET - Abnormal; Notable for the following components:   RBC 3.34 (*)    HCT 35.6 (*)    MCV 106.6 (*)    MCH 36.8 (*)    Platelets 106 (*)    Lymphs Abs 0.3 (*)    All other components within normal limits  Radiology DG Femur Min 2 Views Right  Result Date: 10/21/2021 CLINICAL DATA:  Right hip pain after fall EXAM: RIGHT FEMUR 2 VIEWS; PELVIS - 1-2 VIEW COMPARISON:  None Available.  CT abdomen and pelvis 08/31/2014 FINDINGS: Acute mildly displaced intertrochanteric fracture of the right femur with medial displacement of the lesser trochanter. No hip dislocation. No additional fractures. Degenerative changes pubic symphysis, both hips, SI joints and lower lumbar spine. IMPRESSION: Right intertrochanteric femur fracture. Electronically Signed   By: Placido Sou M.D.   On: 10/21/2021 21:40   DG Pelvis 1-2 Views  Result Date: 10/21/2021 CLINICAL DATA:  Right hip pain after fall EXAM: RIGHT FEMUR 2 VIEWS; PELVIS - 1-2 VIEW COMPARISON:  None Available.  CT abdomen and pelvis 08/31/2014 FINDINGS: Acute mildly displaced intertrochanteric fracture of the right femur with medial displacement of the lesser trochanter. No hip dislocation. No additional fractures. Degenerative changes pubic symphysis, both hips, SI joints and lower lumbar spine. IMPRESSION: Right intertrochanteric femur fracture. Electronically Signed   By: Placido Sou M.D.   On: 10/21/2021 21:40    Pertinent labs & imaging results that were available during my care of the patient were reviewed by me and considered in my medical decision making (see MDM for details).  Medications Ordered in ED Medications  ondansetron (ZOFRAN) injection 4 mg (4 mg Intravenous Given 10/21/21 2105)  morphine (PF) 4 MG/ML injection 4 mg (4 mg Intravenous Given 10/21/21 2106)  fentaNYL (SUBLIMAZE) injection 50 mcg (50 mcg Intravenous Given 10/21/21 2144)                                                                                                                                     Procedures Procedures  (including critical care time)  Medical Decision Making / ED Course   This patient presents to the ED for concern of right hip  injury, this involves an extensive number of treatment options, and is a complaint that carries with it a high risk of complications and morbidity.  The differential diagnosis includes fracture, dislocation, sprain, ligamentous injury  MDM: Patient seen in the emergency department for evaluation of a right hip injury.  Physical exam reveals tenderness and external rotation of the right lower extremity.  X-ray of the pelvis and femur shows a right intratrochanteric femur fracture.  I consulted the orthopedic surgeon on-call Dr. Mardelle Matte who recommends medical admission for likely surgery tomorrow.  Patient then admitted to medicine   Additional history obtained: -Additional history obtained from husband -External records from outside source obtained and reviewed including: Chart review including previous notes, labs, imaging, consultation notes   Lab Tests: -I ordered, reviewed, and interpreted labs.   The pertinent results include:   Labs Reviewed  COMPREHENSIVE METABOLIC PANEL - Abnormal; Notable for the following components:      Result Value   Glucose, Bld 102 (*)    Calcium 8.6 (*)    Total Protein  5.8 (*)    All other components within normal limits  CBC WITH DIFFERENTIAL/PLATELET - Abnormal; Notable for the following components:   RBC 3.34 (*)    HCT 35.6 (*)    MCV 106.6 (*)    MCH 36.8 (*)    Platelets 106 (*)    Lymphs Abs 0.3 (*)    All other components within normal limits        Imaging Studies ordered: I ordered imaging studies including x-ray hip, femur I independently visualized and interpreted imaging. I agree with the radiologist interpretation   Medicines ordered and prescription drug management: Meds ordered this encounter  Medications   DISCONTD: HYDROmorphone (DILAUDID) injection 1 mg   ondansetron (ZOFRAN) injection 4 mg   morphine (PF) 4 MG/ML injection 4 mg   fentaNYL (SUBLIMAZE) injection 50 mcg    -I have reviewed the patients home medicines and  have made adjustments as needed  Critical interventions none  Consultations Obtained: I requested consultation with the orthopedic surgeon Dr. Mardelle Matte,  and discussed lab and imaging findings as well as pertinent plan - they recommend: Medical admission for surgery tomorrow   Cardiac Monitoring: The patient was maintained on a cardiac monitor.  I personally viewed and interpreted the cardiac monitored which showed an underlying rhythm of: NSR  Social Determinants of Health:  Factors impacting patients care include: none   Reevaluation: After the interventions noted above, I reevaluated the patient and found that they have :stayed the same  Co morbidities that complicate the patient evaluation  Past Medical History:  Diagnosis Date   Arthritis    Cough 06/09/2014   Dysuria 06/09/2014   Herpes zoster infection 04/29/2011   Multiple myeloma in relapse ([place]) 10/12/2011   08/19/11: increase in kappa serum free light chains; 7/11: elevation urine protein (157 mg/24 hr) and reappearance monoclonal kappa light chains; 7/18: bone marrow biopsy 4% plasma cells; bone X-rays: no new lesions   Multiple myeloma in remission ([place]) 04/29/2011   Pelvic mass in female 08/28/2014   Renal stone    Yeast infection 06/08/2015      Dispostion: I considered admission for this patient, due to new femur fracture, patient will require hospital admission.     Final Clinical Impression(s) / ED Diagnoses Final diagnoses:  None     @PCDICTATION @    Teressa Lower, MD 10/22/21 0225

## 2021-10-21 NOTE — ED Triage Notes (Signed)
Patient BIB [facility] after mechanical fall while shopping.  Patient landed on right side, c/o right hip pain.  Patient does have rotation to right foot.  Patient did not hit head and did not have LOC.  Patient received 150 mcg of fentanyl by EMS.    132/76 90 HR 98% RA 20 RR 22 L AC

## 2021-10-21 NOTE — ED Notes (Signed)
Patient slid up in bed per request.  Patient and family member upset that she has not had her night time meds yet and patient stated "I'm ready to just to go [facility]."  Patient informed that the admitting MD had just signed up for her and would be putting in meds once pharmacy verified her meds.  Patient and family member assured that this RN would administer her meds as soon as an order was placed.  Admitting MD made aware as well as MD Kommor.

## 2021-10-21 NOTE — ED Notes (Signed)
Patient back from x-ray 

## 2021-10-21 NOTE — ED Notes (Signed)
MD Kommor at bedside.

## 2021-10-22 ENCOUNTER — Encounter (HOSPITAL_COMMUNITY)
Admission: EM | Disposition: A | Discharge status: Home Health | Payer: Self-pay | Source: Home / Self Care | Attending: Internal Medicine

## 2021-10-22 ENCOUNTER — Encounter (HOSPITAL_COMMUNITY): Payer: Self-pay | Admitting: Internal Medicine

## 2021-10-22 ENCOUNTER — Inpatient Hospital Stay (HOSPITAL_COMMUNITY): Payer: 59

## 2021-10-22 ENCOUNTER — Inpatient Hospital Stay (HOSPITAL_COMMUNITY): Payer: 59 | Admitting: Anesthesiology

## 2021-10-22 DIAGNOSIS — Z87440 Personal history of urinary (tract) infections: Secondary | ICD-10-CM | POA: Diagnosis not present

## 2021-10-22 DIAGNOSIS — D696 Thrombocytopenia, unspecified: Secondary | ICD-10-CM

## 2021-10-22 DIAGNOSIS — Z8619 Personal history of other infectious and parasitic diseases: Secondary | ICD-10-CM

## 2021-10-22 DIAGNOSIS — S72001A Fracture of unspecified part of neck of right femur, initial encounter for closed fracture: Secondary | ICD-10-CM | POA: Diagnosis not present

## 2021-10-22 DIAGNOSIS — Z87891 Personal history of nicotine dependence: Secondary | ICD-10-CM

## 2021-10-22 DIAGNOSIS — C9001 Multiple myeloma in remission: Secondary | ICD-10-CM

## 2021-10-22 DIAGNOSIS — Z8781 Personal history of (healed) traumatic fracture: Secondary | ICD-10-CM

## 2021-10-22 DIAGNOSIS — D649 Anemia, unspecified: Secondary | ICD-10-CM

## 2021-10-22 DIAGNOSIS — G894 Chronic pain syndrome: Secondary | ICD-10-CM | POA: Diagnosis present

## 2021-10-22 DIAGNOSIS — S72141A Displaced intertrochanteric fracture of right femur, initial encounter for closed fracture: Secondary | ICD-10-CM

## 2021-10-22 DIAGNOSIS — M1990 Unspecified osteoarthritis, unspecified site: Secondary | ICD-10-CM

## 2021-10-22 HISTORY — PX: INTRAMEDULLARY (IM) NAIL INTERTROCHANTERIC: SHX5875

## 2021-10-22 LAB — VITAMIN D 25 HYDROXY (VIT D DEFICIENCY, FRACTURES): Vit D, 25-Hydroxy: 30.03 ng/mL (ref 30–100)

## 2021-10-22 LAB — HIV ANTIBODY (ROUTINE TESTING W REFLEX): HIV Screen 4th Generation wRfx: NONREACTIVE

## 2021-10-22 LAB — MRSA NEXT GEN BY PCR, NASAL: MRSA by PCR Next Gen: NOT DETECTED

## 2021-10-22 LAB — PREPARE RBC (CROSSMATCH)

## 2021-10-22 SURGERY — FIXATION, FRACTURE, INTERTROCHANTERIC, WITH INTRAMEDULLARY ROD
Anesthesia: General | Laterality: Right

## 2021-10-22 MED ORDER — ENSURE ENLIVE PO LIQD
237.0000 mL | Freq: Two times a day (BID) | ORAL | Status: DC
  Administered 2021-10-23 – 2021-10-25 (×2): 237 mL via ORAL

## 2021-10-22 MED ORDER — ACETAMINOPHEN 500 MG PO TABS: 1000.0000 mg | ORAL_TABLET | Freq: Once | ORAL | Status: DC

## 2021-10-22 MED ORDER — MORPHINE SULFATE (PF) 2 MG/ML IV SOLN
0.5000 mg | INTRAVENOUS | Status: DC | PRN
  Administered 2021-10-22: 1 mg via INTRAVENOUS
  Filled 2021-10-22: qty 1

## 2021-10-22 MED ORDER — ALUM & MAG HYDROXIDE-SIMETH 200-200-20 MG/5ML PO SUSP: 30.0000 mL | ORAL | Status: DC | PRN

## 2021-10-22 MED ORDER — ACETAMINOPHEN 650 MG RE SUPP: 650.0000 mg | Freq: Four times a day (QID) | RECTAL | Status: DC | PRN

## 2021-10-22 MED ORDER — DEXAMETHASONE SODIUM PHOSPHATE 10 MG/ML IJ SOLN
INTRAMUSCULAR | Status: AC
  Filled 2021-10-22: qty 1

## 2021-10-22 MED ORDER — FENTANYL CITRATE PF 50 MCG/ML IJ SOSY
PREFILLED_SYRINGE | INTRAMUSCULAR | Status: AC
  Filled 2021-10-22: qty 2

## 2021-10-22 MED ORDER — CHLORHEXIDINE GLUCONATE 4 % EX LIQD: 60.0000 mL | Freq: Once | CUTANEOUS | Status: DC

## 2021-10-22 MED ORDER — LIDOCAINE HCL (CARDIAC) PF 100 MG/5ML IV SOSY
PREFILLED_SYRINGE | INTRAVENOUS | Status: DC | PRN
  Administered 2021-10-22: 50 mg via INTRAVENOUS

## 2021-10-22 MED ORDER — MENTHOL 3 MG MT LOZG: 1.0000 | LOZENGE | OROMUCOSAL | Status: DC | PRN

## 2021-10-22 MED ORDER — FENTANYL CITRATE PF 50 MCG/ML IJ SOSY
25.0000 ug | PREFILLED_SYRINGE | INTRAMUSCULAR | Status: DC | PRN
  Administered 2021-10-22 (×2): 25 ug via INTRAVENOUS

## 2021-10-22 MED ORDER — ACETAMINOPHEN 500 MG PO TABS
1000.0000 mg | ORAL_TABLET | Freq: Three times a day (TID) | ORAL | Status: DC
  Administered 2021-10-22 – 2021-10-25 (×8): 1000 mg via ORAL
  Filled 2021-10-22 (×8): qty 2

## 2021-10-22 MED ORDER — MIDAZOLAM HCL 2 MG/2ML IJ SOLN
INTRAMUSCULAR | Status: AC
  Filled 2021-10-22: qty 2

## 2021-10-22 MED ORDER — SENNA 8.6 MG PO TABS
1.0000 | ORAL_TABLET | Freq: Two times a day (BID) | ORAL | Status: DC
  Administered 2021-10-24 – 2021-10-25 (×2): 8.6 mg via ORAL
  Filled 2021-10-22 (×5): qty 1

## 2021-10-22 MED ORDER — PROPOFOL 10 MG/ML IV BOLUS
INTRAVENOUS | Status: DC | PRN
  Administered 2021-10-22: 90 mg via INTRAVENOUS

## 2021-10-22 MED ORDER — MAGNESIUM CITRATE PO SOLN: 1.0000 | Freq: Once | ORAL | Status: DC | PRN

## 2021-10-22 MED ORDER — ONDANSETRON HCL 4 MG/2ML IJ SOLN
4.0000 mg | Freq: Four times a day (QID) | INTRAMUSCULAR | Status: DC | PRN
  Administered 2021-10-22: 4 mg via INTRAVENOUS
  Filled 2021-10-22: qty 2

## 2021-10-22 MED ORDER — ACETAMINOPHEN 325 MG PO TABS: 650.0000 mg | ORAL_TABLET | Freq: Four times a day (QID) | ORAL | Status: DC | PRN

## 2021-10-22 MED ORDER — DEXAMETHASONE SODIUM PHOSPHATE 10 MG/ML IJ SOLN
INTRAMUSCULAR | Status: DC | PRN
  Administered 2021-10-22: 5 mg via INTRAVENOUS

## 2021-10-22 MED ORDER — LACTATED RINGERS IV BOLUS
500.0000 mL | Freq: Once | INTRAVENOUS | Status: AC
  Administered 2021-10-22: 500 mL via INTRAVENOUS

## 2021-10-22 MED ORDER — CEFAZOLIN SODIUM-DEXTROSE 2-4 GM/100ML-% IV SOLN
2.0000 g | INTRAVENOUS | Status: AC
  Administered 2021-10-22: 2 g via INTRAVENOUS
  Filled 2021-10-22: qty 100

## 2021-10-22 MED ORDER — POLYETHYLENE GLYCOL 3350 17 G PO PACK: 17.0000 g | PACK | Freq: Every day | ORAL | Status: DC | PRN

## 2021-10-22 MED ORDER — METHOCARBAMOL 500 MG PO TABS
500.0000 mg | ORAL_TABLET | Freq: Three times a day (TID) | ORAL | Status: DC | PRN
  Administered 2021-10-22 (×2): 500 mg via ORAL
  Filled 2021-10-22 (×3): qty 1

## 2021-10-22 MED ORDER — ENOXAPARIN SODIUM 40 MG/0.4ML IJ SOSY: 40.0000 mg | PREFILLED_SYRINGE | INTRAMUSCULAR | Status: DC

## 2021-10-22 MED ORDER — ONDANSETRON HCL 4 MG PO TABS: 4.0000 mg | ORAL_TABLET | Freq: Four times a day (QID) | ORAL | Status: DC | PRN

## 2021-10-22 MED ORDER — VALACYCLOVIR HCL 500 MG PO TABS
1000.0000 mg | ORAL_TABLET | Freq: Every day | ORAL | Status: DC
  Administered 2021-10-22 – 2021-10-25 (×4): 1000 mg via ORAL
  Filled 2021-10-22 (×4): qty 2

## 2021-10-22 MED ORDER — LACTATED RINGERS IV SOLN: INTRAVENOUS | Status: AC

## 2021-10-22 MED ORDER — FENTANYL CITRATE (PF) 100 MCG/2ML IJ SOLN
INTRAMUSCULAR | Status: AC
  Filled 2021-10-22: qty 2

## 2021-10-22 MED ORDER — PHENOL 1.4 % MT LIQD: 1.0000 | OROMUCOSAL | Status: DC | PRN

## 2021-10-22 MED ORDER — FENTANYL CITRATE PF 50 MCG/ML IJ SOSY
50.0000 ug | PREFILLED_SYRINGE | INTRAMUSCULAR | Status: DC | PRN
  Administered 2021-10-22: 50 ug via INTRAVENOUS
  Filled 2021-10-22: qty 1

## 2021-10-22 MED ORDER — FENTANYL CITRATE PF 50 MCG/ML IJ SOSY: 25.0000 ug | PREFILLED_SYRINGE | INTRAMUSCULAR | Status: DC | PRN

## 2021-10-22 MED ORDER — SUGAMMADEX SODIUM 200 MG/2ML IV SOLN
INTRAVENOUS | Status: DC | PRN
  Administered 2021-10-22: 110 mg via INTRAVENOUS

## 2021-10-22 MED ORDER — BISACODYL 10 MG RE SUPP: 10.0000 mg | Freq: Every day | RECTAL | Status: DC | PRN

## 2021-10-22 MED ORDER — MORPHINE SULFATE (PF) 4 MG/ML IV SOLN
4.0000 mg | INTRAVENOUS | Status: DC | PRN
  Administered 2021-10-22: 4 mg via INTRAVENOUS
  Filled 2021-10-22: qty 1

## 2021-10-22 MED ORDER — OXYCODONE HCL ER 15 MG PO T12A
30.0000 mg | EXTENDED_RELEASE_TABLET | Freq: Two times a day (BID) | ORAL | Status: DC
  Administered 2021-10-24 – 2021-10-25 (×2): 30 mg via ORAL
  Filled 2021-10-22 (×5): qty 2

## 2021-10-22 MED ORDER — TRANEXAMIC ACID-NACL 1000-0.7 MG/100ML-% IV SOLN
1000.0000 mg | INTRAVENOUS | Status: AC
  Administered 2021-10-22: 1000 mg via INTRAVENOUS
  Filled 2021-10-22: qty 100

## 2021-10-22 MED ORDER — PROMETHAZINE HCL 25 MG/ML IJ SOLN: 6.2500 mg | INTRAMUSCULAR | Status: DC | PRN

## 2021-10-22 MED ORDER — TRANEXAMIC ACID-NACL 1000-0.7 MG/100ML-% IV SOLN
1000.0000 mg | Freq: Once | INTRAVENOUS | Status: AC
  Administered 2021-10-22: 1000 mg via INTRAVENOUS
  Filled 2021-10-22: qty 100

## 2021-10-22 MED ORDER — SULFAMETHOXAZOLE-TRIMETHOPRIM 800-160 MG PO TABS
1.0000 | ORAL_TABLET | ORAL | Status: DC
  Administered 2021-10-23 – 2021-10-25 (×2): 1 via ORAL
  Filled 2021-10-22 (×2): qty 1

## 2021-10-22 MED ORDER — OXYCODONE HCL 5 MG PO TABS
5.0000 mg | ORAL_TABLET | ORAL | Status: DC | PRN
  Administered 2021-10-22 (×2): 5 mg via ORAL
  Filled 2021-10-22 (×2): qty 1

## 2021-10-22 MED ORDER — SODIUM CHLORIDE 0.9% IV SOLUTION: Freq: Once | INTRAVENOUS | Status: DC

## 2021-10-22 MED ORDER — DOCUSATE SODIUM 100 MG PO CAPS
100.0000 mg | ORAL_CAPSULE | Freq: Two times a day (BID) | ORAL | Status: DC
Start: 2021-10-22 — End: 2021-10-25
  Administered 2021-10-22 – 2021-10-25 (×6): 100 mg via ORAL
  Filled 2021-10-22 (×6): qty 1

## 2021-10-22 MED ORDER — LIDOCAINE HCL (PF) 2 % IJ SOLN
INTRAMUSCULAR | Status: AC
  Filled 2021-10-22: qty 5

## 2021-10-22 MED ORDER — ONDANSETRON HCL 4 MG/2ML IJ SOLN
INTRAMUSCULAR | Status: DC | PRN
  Administered 2021-10-22: 4 mg via INTRAVENOUS

## 2021-10-22 MED ORDER — FENTANYL CITRATE (PF) 100 MCG/2ML IJ SOLN
INTRAMUSCULAR | Status: DC | PRN
  Administered 2021-10-22 (×2): 50 ug via INTRAVENOUS

## 2021-10-22 MED ORDER — CEFAZOLIN SODIUM-DEXTROSE 2-4 GM/100ML-% IV SOLN
2.0000 g | Freq: Four times a day (QID) | INTRAVENOUS | Status: AC
  Administered 2021-10-22 – 2021-10-23 (×2): 2 g via INTRAVENOUS
  Filled 2021-10-22 (×2): qty 100

## 2021-10-22 MED ORDER — ONDANSETRON HCL 4 MG/2ML IJ SOLN
INTRAMUSCULAR | Status: AC
  Filled 2021-10-22: qty 2

## 2021-10-22 MED ORDER — PANTOPRAZOLE SODIUM 40 MG PO TBEC
40.0000 mg | DELAYED_RELEASE_TABLET | Freq: Every day | ORAL | Status: DC
  Administered 2021-10-22 – 2021-10-25 (×4): 40 mg via ORAL
  Filled 2021-10-22 (×4): qty 1

## 2021-10-22 MED ORDER — FERROUS SULFATE 325 (65 FE) MG PO TABS
325.0000 mg | ORAL_TABLET | Freq: Three times a day (TID) | ORAL | Status: DC
Start: 2021-10-23 — End: 2021-10-25
  Administered 2021-10-23 – 2021-10-25 (×8): 325 mg via ORAL
  Filled 2021-10-22 (×8): qty 1

## 2021-10-22 MED ORDER — MIDAZOLAM HCL 2 MG/2ML IJ SOLN
INTRAMUSCULAR | Status: DC | PRN
  Administered 2021-10-22: 1 mg via INTRAVENOUS

## 2021-10-22 MED ORDER — ROCURONIUM BROMIDE 10 MG/ML (PF) SYRINGE
PREFILLED_SYRINGE | INTRAVENOUS | Status: DC | PRN
  Administered 2021-10-22: 40 mg via INTRAVENOUS

## 2021-10-22 MED ORDER — PHENYLEPHRINE 80 MCG/ML (10ML) SYRINGE FOR IV PUSH (FOR BLOOD PRESSURE SUPPORT)
PREFILLED_SYRINGE | INTRAVENOUS | Status: DC | PRN
  Administered 2021-10-22 (×5): 80 ug via INTRAVENOUS

## 2021-10-22 MED ORDER — POVIDONE-IODINE 10 % EX SWAB
2.0000 | Freq: Once | CUTANEOUS | Status: AC
  Administered 2021-10-22: 2 via TOPICAL

## 2021-10-22 MED ORDER — VITAMIN D 25 MCG (1000 UNIT) PO TABS
1000.0000 [IU] | ORAL_TABLET | Freq: Every day | ORAL | Status: DC
  Administered 2021-10-22 – 2021-10-25 (×4): 1000 [IU] via ORAL
  Filled 2021-10-22 (×4): qty 1

## 2021-10-22 MED ORDER — PROPOFOL 10 MG/ML IV BOLUS
INTRAVENOUS | Status: AC
  Filled 2021-10-22: qty 20

## 2021-10-22 SURGICAL SUPPLY — 43 items
APL SKNCLS STERI-STRIP NONHPOA (GAUZE/BANDAGES/DRESSINGS) ×1
BAG COUNTER SPONGE SURGICOUNT (BAG) ×3 IMPLANT
BAG SPNG CNTER NS LX DISP (BAG) ×1
BENZOIN TINCTURE PRP APPL 2/3 (GAUZE/BANDAGES/DRESSINGS) ×3 IMPLANT
BIT DRILL CALIBRATED 4.2 (BIT) IMPLANT
BIT DRILL CANN 16 (BIT) ×1 IMPLANT
BIT DRILL CANN STP 6/9 HIP (BIT) ×1 IMPLANT
BIT DRILL TAPERED 10 (BIT) ×1 IMPLANT
BLADE TFNA HELICAL 85 (Anchor) ×1 IMPLANT
BNDG CMPR 5X6 CHSV STRCH STRL (GAUZE/BANDAGES/DRESSINGS) ×1
BNDG COHESIVE 6X5 TAN ST LF (GAUZE/BANDAGES/DRESSINGS) ×3 IMPLANT
CLSR STERI-STRIP ANTIMIC 1/2X4 (GAUZE/BANDAGES/DRESSINGS) ×3 IMPLANT
COVER PERINEAL POST (MISCELLANEOUS) ×3 IMPLANT
COVER SURGICAL LIGHT HANDLE (MISCELLANEOUS) ×3 IMPLANT
DRAPE STERI IOBAN 125X83 (DRAPES) ×3 IMPLANT
DRESSING MEPILEX FLEX 4X4 (GAUZE/BANDAGES/DRESSINGS) ×4 IMPLANT
DRILL BIT CALIBRATED 4.2 (BIT) ×2
DRSG MEPILEX FLEX 4X4 (GAUZE/BANDAGES/DRESSINGS) ×2
DRSG MEPILEX POST OP 4X8 (GAUZE/BANDAGES/DRESSINGS) ×1 IMPLANT
DURAPREP 26ML APPLICATOR (WOUND CARE) ×3 IMPLANT
ELECT REM PT RETURN 15FT ADLT (MISCELLANEOUS) ×3 IMPLANT
GAUZE XEROFORM 5X9 LF (GAUZE/BANDAGES/DRESSINGS) ×3 IMPLANT
GLOVE BIO SURGEON STRL SZ 6.5 (GLOVE) ×6 IMPLANT
GLOVE BIO SURGEON STRL SZ7.5 (GLOVE) ×3 IMPLANT
GLOVE BIOGEL PI IND STRL 7.0 (GLOVE) ×2 IMPLANT
GLOVE BIOGEL PI INDICATOR 7.0 (GLOVE) ×2
GLOVE INDICATOR 8.0 STRL GRN (GLOVE) ×3 IMPLANT
GOWN STRL REIN XL XLG (GOWN DISPOSABLE) ×6 IMPLANT
GUIDEWIRE 3.2X400 (WIRE) ×1 IMPLANT
KIT BASIN OR (CUSTOM PROCEDURE TRAY) ×3 IMPLANT
KIT TURNOVER KIT A (KITS) ×1 IMPLANT
MANIFOLD NEPTUNE II (INSTRUMENTS) ×3 IMPLANT
NAIL IM TFNA 9X235 130D RT (Nail) ×1 IMPLANT
NS IRRIG 1000ML POUR BTL (IV SOLUTION) ×3 IMPLANT
PACK GENERAL/GYN (CUSTOM PROCEDURE TRAY) ×3 IMPLANT
PAD ARMBOARD 7.5X6 YLW CONV (MISCELLANEOUS) ×6 IMPLANT
SCREW LOCK IM TI 5X30 (Screw) ×1 IMPLANT
SUT VIC AB 0 CT1 27 (SUTURE) ×2
SUT VIC AB 0 CT1 27XBRD ANBCTR (SUTURE) ×2 IMPLANT
SUT VIC AB 3-0 SH 27 (SUTURE) ×4
SUT VIC AB 3-0 SH 27X BRD (SUTURE) ×4 IMPLANT
TOWEL OR 17X26 10 PK STRL BLUE (TOWEL DISPOSABLE) ×3 IMPLANT
WATER STERILE IRR 1000ML POUR (IV SOLUTION) ×6 IMPLANT

## 2021-10-22 NOTE — Assessment & Plan Note (Signed)
   Longstanding history of recurrent urinary tract infections  Continue outpatient regimen of suppressive Bactrim therapy

## 2021-10-22 NOTE — ED Notes (Signed)
Patient states she wants to hold off on the oxycontin right now and wait for the home meds her husband is bringing which are ok to take per MD Kommer and MD Shalhoub.

## 2021-10-22 NOTE — Progress Notes (Signed)
The risks benefits and alternatives were discussed with the patient including but not limited to the risks of nonoperative treatment, versus surgical intervention including infection, bleeding, nerve injury, malunion, nonunion, the need for revision surgery, hardware prominence, hardware failure, the need for hardware removal, blood clots, cardiopulmonary complications, morbidity, mortality, among others, and they were willing to proceed.    The patient has been re-examined, and the chart reviewed, and there have been no interval changes to the documented history and physical.    The risks, benefits, and alternatives have been discussed at length, and the patient is willing to proceed.    Plan right hip nail.  Johnny Bridge, MD

## 2021-10-22 NOTE — Progress Notes (Signed)
Initial Nutrition Assessment  DOCUMENTATION CODES:   Not applicable  INTERVENTION:  Monitor for diet advancement Once diet resumes recommend: Regular diet Ensure Enlive po BID, each supplement provides 350 kcal and 20 grams of protein.  NUTRITION DIAGNOSIS:   Increased nutrient needs related to post-op healing, hip fracture as evidenced by estimated needs.  GOAL:   Patient will meet greater than or equal to 90% of their needs  MONITOR:   Diet advancement, Labs, Weight trends, Skin  REASON FOR ASSESSMENT:   Consult Assessment of nutrition requirement/status  ASSESSMENT:   Pt admitted with R intertrochanteric fx after a fall. 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  Pending Ortho consult. NPO for possible surgical intervention today.   Pt sitting in bed at time of visit. She reports that she has had a good appetite with no changes to her baseline PO intake. She usually eats 3 times per day with some snacks in between. They typically eat out with friends Friday nights and may eat out 1-2 additional nights per week. Yesterday she ate a poptart for breakfast and had not eaten since d/t her fall and remaining NPO. She is looking forward to getting a Teacher, adult education from [employer].   Pt reports that she chronically has been on the "smaller" side regarding her weight. Her weight has remained stable between 113-117 lbs and denies weight loss within the last few years. She states that the treatment for her multiple myeloma has prevented her from gaining weight.   Limited documentation of weight history on file within the last year.  Admit wt 49.9 kg  Discussed with pt importance of maintaining adequate PO intake with emphasis on protein intake after hip repair to aid in post-op healing. She is agreeable to Ensure but prefers to try to eat her protein over addition of nutrition  supplements.   Medications: Vitamin D3, protonix IV drips: LR @ 68m/hr  Labs: reviewed  NUTRITION - FOCUSED PHYSICAL EXAM:  Flowsheet Row Most Recent Value  Orbital Region No depletion  Upper Arm Region No depletion  Thoracic and Lumbar Region No depletion  Buccal Region No depletion  Temple Region No depletion  Clavicle Bone Region Mild depletion  Clavicle and Acromion Bone Region No depletion  Scapular Bone Region No depletion  Dorsal Hand Mild depletion  Patellar Region No depletion  Anterior Thigh Region No depletion  Posterior Calf Region No depletion  Edema (RD Assessment) None  Hair Reviewed  Eyes Reviewed  Mouth Reviewed  Skin Reviewed  Nails Reviewed       Diet Order:   Diet Order             Diet NPO time specified  Diet effective ____           Diet NPO time specified  Diet effective now                   EDUCATION NEEDS:   Education needs have been addressed  Skin:  Skin Assessment: Reviewed RN Assessment  Last BM:  8/13  Height:   Ht Readings from Last 1 Encounters:  10/21/21 4' 9"  (1.448 m)    Weight:   Wt Readings from Last 1 Encounters:  10/21/21 49.9 kg   BMI:  Body mass index is 23.8 kg/m.  Estimated Nutritional Needs:   Kcal:  1400-1600  Protein:  65-75g  Fluid:  >/=1.5L  [name] RDN, LDN Clinical  Nutrition

## 2021-10-22 NOTE — H&P (Addendum)
History and Physical    Patient: Jessica Cochran MRN: [id number] DOA: 10/21/2021  Date of Service: the patient was seen and examined on 10/22/2021  Patient coming from: Home via EMS  Chief Complaint:  Chief Complaint  Patient presents with   Fall   Hip Injury    HPI:   61 year old female with past medical history of kappa light chain relapsed and refractory multiple myeloma who follows with Dr. Alvy Bimler as well as [facility] myeloma clinic status post CAR-T in 2021 at [facility], recurrent varicella-zoster virus infections, gastroesophageal reflux disease who presented to [facility] emergency department via EMS status post mechanical fall.  Patient explains that she was walking through a shoe store on 8/14 when she tried to sit down on a bench missing and falling on the floor.  This resulted in immediate right hip pain.  Patient states she felt an associated "crack" with the fall.  Patient states she immediately felt severe right hip pain.  Pain is sharp in quality, worse with movement of the affected extremity or attempts at weight bearing.  Pain radiates distally.  Patient denies loss of consciousness or lightheadedness that precipitated the fall.  EMS was contacted who promptly came to evaluate the patient and brought her in to [facility] emergency department for evaluation.   Upon evaluation in the emergency department patient identified to have a right intertrochanteric femur fracture.  EDP discussed case with Dr.  Mardelle Matte with orthopedic surgery who stated that he would evaluate the patient in the morning for potential operative intervention.  The hospitalist group is now been called to assess the patient for admission to the hospital.  Review of Systems: Review of Systems  Musculoskeletal:  Positive for falls and joint pain.  All other systems reviewed and are negative.    Past Medical History:  Diagnosis Date   Arthritis    Cough 06/09/2014   Dysuria 06/09/2014    Herpes zoster infection 04/29/2011   Multiple myeloma in relapse ([place]) 10/12/2011   08/19/11: increase in kappa serum free light chains; 7/11: elevation urine protein (157 mg/24 hr) and reappearance monoclonal kappa light chains; 7/18: bone marrow biopsy 4% plasma cells; bone X-rays: no new lesions   Multiple myeloma in remission ([place]) 04/29/2011   Pelvic mass in female 08/28/2014   Renal stone    Yeast infection 06/08/2015    Past Surgical History:  Procedure Laterality Date   BACK SURGERY     2009   PILONIDAL CYST DRAINAGE  1991   rectal abcess     stem cell transplant autologus     uterine fibroid tumor removed     1990's    Social History:  reports that she has quit smoking. She has a 1.25 pack-year smoking history. She has never used smokeless tobacco. She reports that she does not drink alcohol and does not use drugs.  Allergies  Allergen Reactions   Hydromorphone Nausea Only    Family History  Problem Relation Age of Onset   Cancer Sister        skin cancer (melanoma)    Prior to Admission medications   Medication Sig Start Date End Date Taking? Authorizing Provider  cholecalciferol (VITAMIN D) 1000 UNITS tablet Take 1,000 Units by mouth daily.   Yes [provider]  Multiple Vitamins-Minerals (ONE-A-DAY 50 PLUS PO) Take by mouth daily.   Yes [provider]  oxyCODONE ER (XTAMPZA ER) 27 MG C12A Take 2 tablets by mouth every 12 (twelve) hours.  Yes [provider]  pantoprazole (PROTONIX) 40 MG tablet TAKE (1) TABLET DAILY AS NEEDED. Patient taking differently: Take 40 mg by mouth daily. 07/23/18  Yes Gorsuch, Ni, MD  sulfamethoxazole-trimethoprim (BACTRIM DS) 800-160 MG tablet Take 1 tablet by mouth 3 (three) times a week. 10/21/21  Yes [provider]  valACYclovir (VALTREX) 1000 MG tablet TAKE 1 TABLET ONCE DAILY. Patient taking differently: Take 1,000 mg by mouth daily. 09/29/18  Yes Gorsuch, Ni, MD  clonazePAM (KLONOPIN) 0.5 MG tablet  Take 1 tablet (0.5 mg total) by mouth 2 (two) times daily as needed for anxiety. Patient not taking: Reported on 10/21/2021 02/25/19   Harle Stanford., PA-C    Physical Exam:  Vitals:   10/22/21 0130 10/22/21 0200 10/22/21 0256 10/22/21 0610  BP: 122/81 123/69 133/87 (!) 153/85  Pulse: 89 99 99 94  Resp: 12 18 18 18   Temp:   99 F (37.2 C) 99.1 F (37.3 C)  TempSrc:   Oral Oral  SpO2: 99% 94% 99% 98%  Weight:      Height:        Constitutional: Awake alert and oriented x3, In distress due to hip pain Skin: no rashes, no lesions, poor skin turgor noted. Eyes: Pupils are equally reactive to light.  No evidence of scleral icterus or conjunctival pallor.  ENMT: Moist mucous membranes noted.  Posterior pharynx clear of any exudate or lesions.   Neck: normal, supple, no masses, no thyromegaly.  No evidence of jugular venous distension.   Respiratory: clear to auscultation bilaterally, no wheezing, no crackles. Normal respiratory effort. No accessory muscle use.  Cardiovascular: Regular rate and rhythm, no murmurs / rubs / gallops. No extremity edema. 2+ pedal pulses. No carotid bruits.  Chest:   Nontender without crepitus or deformity.   Back:   Nontender without crepitus or deformity. Abdomen: Abdomen is soft and nontender.  No evidence of intra-abdominal masses.  Positive bowel sounds noted in all quadrants.   Musculoskeletal: Severe pain of the right hip with both passive and active range of motion.  Outward orientation of the right lower extremity noted.  Good ROM, no contractures. Normal muscle tone.  Neurologic: CN 2-12 grossly intact. Sensation intact.  Patient moving all 4 extremities spontaneously however movement of the right lower extremity is limited due to pain..  Patient is following all commands.  Patient is responsive to verbal stimuli.   Psychiatric: Patient exhibits normal mood with appropriate affect.  Patient seems to possess insight as to their current situation.     Data Reviewed:  I have personally reviewed and interpreted labs, imaging.  Significant findings are:  Pelvic x-ray revealing right intertrochanteric femur fracture Chemistry revealing sodium 141, potassium 4.2, chloride 107, bicarbonate 27, BUN 15, creatinine 0.85 CBC revealing white blood cell count of 7.9, hemoglobin 12.3, hematocrit 35.6, platelet count of 106.   Assessment and Plan: * Closed right hip fracture, initial encounter [facility]) Patient suffered a mechanical fall resulting in a intertrochanteric right hip fracture ER provider discussed case with Dr. Mardelle Matte with orthopedic surgery who will evaluate patient in consultation Possible operative intervention on 8/15 Will keep patient n.p.o. for now  Gentle intravenous hydration As needed intravenous opiate-based analgesics for substantial associated pain superimposed on basal Xtampza therapy Obtaining vitamin D level   History of herpes zoster History of multiple varicella-zoster infections Patient is on longstanding chronic valacyclovir therapy which will be continued  Thrombocytopenia ([place]) Chronic, related to patient's longstanding history of multiple myeloma, now on remission  status post CAR-T at [facility] in 2021 No clinical evidence of bleeding Monitoring platelet counts with serial CBCs  History of recurrent UTI (urinary tract infection) Longstanding history of recurrent urinary tract infections Continue outpatient regimen of suppressive Bactrim therapy  Chronic pain syndrome Patient on Xtampza twice daily We will maintain this for basal analgesia We will additionally provide patient with intravenous opiate-based analgesics for breakthrough pain considering hip fracture       Code Status:  Full code  code status decision has been confirmed with: patient Family Communication:   Husband is at bedside and has been updated on plan of care  Consults: Dr. Mardelle Matte with Orthopedic surgery  Severity of  Illness:  The appropriate patient status for this patient is INPATIENT. Inpatient status is judged to be reasonable and necessary in order to provide the required intensity of service to ensure the patient's safety. The patient's presenting symptoms, physical exam findings, and initial radiographic and laboratory data in the context of their chronic comorbidities is felt to place them at high risk for further clinical deterioration. Furthermore, it is not anticipated that the patient will be medically stable for discharge from the hospital within 2 midnights of admission.   * I certify that at the point of admission it is my clinical judgment that the patient will require inpatient hospital care spanning beyond 2 midnights from the point of admission due to high intensity of service, high risk for further deterioration and high frequency of surveillance required.*  Author:  Vernelle Emerald MD  10/22/2021 8:03 AM

## 2021-10-22 NOTE — Transfer of Care (Signed)
Immediate Anesthesia Transfer of Care Note  Patient: Jessica Cochran  Procedure(s) Performed: INTRAMEDULLARY (IM) NAIL INTERTROCHANTERIC (Right)  Patient Location: PACU  Anesthesia Type:General  Level of Consciousness: awake, drowsy and patient cooperative  Airway & Oxygen Therapy: Patient Spontanous Breathing and Patient connected to face mask oxygen  Post-op Assessment: Report given to RN and Post -op Vital signs reviewed and stable  Post vital signs: Reviewed and stable  Last Vitals:  Vitals Value Taken Time  BP 124/90 10/22/21 1649  Temp    Pulse 99 1653  Resp 16 10/22/21 1652  SpO2 100 1653  Vitals shown include unvalidated device data.  Last Pain:  Vitals:   10/22/21 1446  TempSrc: Oral  PainSc: 7       Patients Stated Pain Goal: 2 (12/11/47 6116)  Complications: No notable events documented.

## 2021-10-22 NOTE — Assessment & Plan Note (Signed)
   Patient on Xtampza twice daily  We will maintain this for basal analgesia  We will additionally provide patient with intravenous opiate-based analgesics for breakthrough pain considering hip fracture

## 2021-10-22 NOTE — Consult Note (Signed)
ORTHOPAEDIC CONSULTATION  REQUESTING PHYSICIAN: Edwin Dada, *  Chief Complaint: Right hip pain  HPI: Jessica Cochran is a 61 y.o. female with history of myeloma, recurrent varicella-zoster virus infections, GERD who presented to the [facility] emergency department with new onset right hip pain after fall.  She was shopping at a department store yesterday and fell off of a store bench.  She fell directly onto her right hip, she states she heard a crack at the time of the fall.  She had immediate severe right hip pain and was unable to get up.  EMS was called and she was brought to the emergency department.  Today she states pain at the right hip is severe, better with rest and pain medication worse with movement.  She feels the majority of her pain at her lateral right thigh and extending down the leg.  Denies numbness or tingling.  She is not on a blood thinner.  She does not use an assistive device at baseline.  She is on pain management which she has been on for over 14 years.  She takes extended release oxycodone 30 mg every 12 hours, this is for chronic back pain.  Past Medical History:  Diagnosis Date   Arthritis    Cough 06/09/2014   Dysuria 06/09/2014   Herpes zoster infection 04/29/2011   Multiple myeloma in relapse ([place]) 10/12/2011   08/19/11: increase in kappa serum free light chains; 7/11: elevation urine protein (157 mg/24 hr) and reappearance monoclonal kappa light chains; 7/18: bone marrow biopsy 4% plasma cells; bone X-rays: no new lesions   Multiple myeloma in remission ([place]) 04/29/2011   Pelvic mass in female 08/28/2014   Renal stone    Yeast infection 06/08/2015   Past Surgical History:  Procedure Laterality Date   BACK SURGERY     2009   PILONIDAL CYST DRAINAGE  1991   rectal abcess     stem cell transplant autologus     uterine fibroid tumor removed     1990's   Social History   Socioeconomic History   Marital status: Married    Spouse name: Not on  file   Number of children: Not on file   Years of education: Not on file   Highest education level: Not on file  Occupational History   Not on file  Tobacco Use   Smoking status: Former    Packs/day: 0.25    Years: 5.00    Total pack years: 1.25    Types: Cigarettes   Smokeless tobacco: Never  Substance and Sexual Activity   Alcohol use: No   Drug use: No   Sexual activity: Not on file  Other Topics Concern   Not on file  Social History Narrative   Not on file   Social Determinants of Health   Financial Resource Strain: Not on file  Food Insecurity: Not on file  Transportation Needs: Not on file  Physical Activity: Not on file  Stress: Not on file  Social Connections: Not on file   Family History  Problem Relation Age of Onset   Cancer Sister        skin cancer (melanoma)   Allergies  Allergen Reactions   Hydromorphone Nausea Only     Positive ROS: All other systems have been reviewed and were otherwise negative with the exception of those mentioned in the HPI and as above.  Physical Exam: General: Alert, no acute distress, laying in bed. Cardiovascular: No pedal edema Respiratory:  No cyanosis, no use of accessory musculature GI: No organomegaly, abdomen is soft and non-tender Skin: No lesions in the area of chief complaint Neurologic: Sensation intact distally Psychiatric: Patient is competent for consent with normal mood and affect Lymphatic: No axillary or cervical lymphadenopathy  MUSCULOSKELETAL: Right lower extremity shortened and externally rotated.  Tender to palpation to lateral right thigh.  Dorsiflexion plantarflexion intact.  Distal sensation intact.  2+ DP pulse.  Imaging 2 views of right hip shows mildly displaced right intertrochanteric hip fracture  Assessment: Closed right intertrochanteric hip fracture  Plan:  Discussed the nature of the injury as well as the care with the patient.  Discussed options and non-operative versus operative  measures. Nonoperative measures are not well tolerated as patient's on bedrest for extended periods of time tend to develop secondary issues such as pneumonia, urinary tract infections, bedsores and delirium.  Based on this our recommendation is for operative measures.  Understanding this the patient has elected to proceed with operative measures.  The risks and benefits of  surgical intervention including infection, bleeding, nerve injury, periprosthetic fracture, the need for revision surgery, leg length discrepancy, gait change, blood clots, cardiopulmonary complications, morbidity, mortality, among others, and they were willing to proceed.    - Plan: Right hip IM nail by Dr. Mardelle Matte later this afternoon as soon as OR is available, please continue to keep npo - Weight Bearing Status/Activity: will ammend WB status postop, bedrest for now - PT/OT post op - VTE Prophylaxis: SCDs for now - Pain control: Baseline oxycodone 30 mg BID, PRN pain medications - Contact information: Dr. Marchia Bond, Merlene Pulling, PA-C    Ventura Bruns, PA-C   10/22/2021 10:41 AM

## 2021-10-22 NOTE — Assessment & Plan Note (Signed)
   History of multiple varicella-zoster infections  Patient is on longstanding chronic valacyclovir therapy which will be continued

## 2021-10-22 NOTE — Anesthesia Procedure Notes (Signed)
Procedure Name: Intubation Date/Time: 10/22/2021 3:36 PM  Performed by: Raenette Rover, CRNAPre-anesthesia Checklist: Patient identified, Emergency Drugs available, Suction available and Patient being monitored Patient Re-evaluated:Patient Re-evaluated prior to induction Oxygen Delivery Method: Circle system utilized Preoxygenation: Pre-oxygenation with 100% oxygen Induction Type: IV induction Ventilation: Mask ventilation without difficulty Laryngoscope Size: Mac and 3 Grade View: Grade I Tube type: Oral Tube size: 7.0 mm Number of attempts: 1 Airway Equipment and Method: Stylet Placement Confirmation: ETT inserted through vocal cords under direct vision, positive ETCO2 and breath sounds checked- equal and bilateral Secured at: 21 cm Tube secured with: Tape Dental Injury: Teeth and Oropharynx as per pre-operative assessment

## 2021-10-22 NOTE — Anesthesia Preprocedure Evaluation (Addendum)
Anesthesia Evaluation  Patient identified by MRN, date of birth, ID band Patient awake    Reviewed: Allergy & Precautions, NPO status , Patient's Chart, lab work & pertinent test results  Airway Mallampati: II  TM Distance: >3 FB Neck ROM: Full    Dental no notable dental hx. (+) Dental Advisory Given, Teeth Intact   Pulmonary former smoker,    Pulmonary exam normal breath sounds clear to auscultation       Cardiovascular Normal cardiovascular exam Rhythm:Regular Rate:Normal  Echo 2020 1. Left ventricular ejection fraction, by visual estimation, is 55 to 60%. The left ventricle has normal function. Normal left ventricular size. There is no left ventricular hypertrophy.  2. Global right ventricle has normal systolic function.The right ventricular size is mildly enlarged. No increase in right ventricular wall thickness.  3. Left atrial size was normal.  4. Right atrial size was normal.  5. The mitral valve is normal in structure. No evidence of mitral valve regurgitation. No evidence of mitral stenosis.  6. The tricuspid valve is normal in structure. Tricuspid valve regurgitation is mild.  7. The aortic valve is normal in structure. Aortic valve regurgitation was not visualized by color flow Doppler. Structurally normal aortic valve, with no evidence of sclerosis or stenosis.  8. The pulmonic valve was normal in structure. Pulmonic valve regurgitation is not visualized by color flow Doppler.  9. The inferior vena cava is normal in size with greater than 50% respiratory variability, suggesting right atrial pressure of 3 mmHg.  10. The average left ventricular global longitudinal strain is 19.2 %.    Neuro/Psych negative neurological ROS     GI/Hepatic negative GI ROS, Neg liver ROS,   Endo/Other  negative endocrine ROS  Renal/GU Renal disease     Musculoskeletal  (+) Arthritis ,   Abdominal   Peds   Hematology  (+) Blood dyscrasia, anemia ,   Anesthesia Other Findings   Reproductive/Obstetrics                          Anesthesia Physical Anesthesia Plan  ASA: 3  Anesthesia Plan: General   Post-op Pain Management: Tylenol PO (pre-op)*   Induction: Intravenous  PONV Risk Score and Plan: 4 or greater and Ondansetron, Dexamethasone, Treatment may vary due to age or medical condition and Midazolam  Airway Management Planned: Oral ETT  Additional Equipment:   Intra-op Plan:   Post-operative Plan: Extubation in OR  Informed Consent: I have reviewed the patients History and Physical, chart, labs and discussed the procedure including the risks, benefits and alternatives for the proposed anesthesia with the patient or authorized representative who has indicated his/her understanding and acceptance.     Dental advisory given  Plan Discussed with: CRNA  Anesthesia Plan Comments:        Anesthesia Quick Evaluation

## 2021-10-22 NOTE — Progress Notes (Signed)
Pt's HR-104, B/P-94/73, 83/71. It turned patient to yellow MEWS. Initiated yellow MEWS protocol. Notified to Forest Gleason NP, ordered bolus IVF LR 521m. Patient is alert and watching TV. Pt's husband at bed side. Started IVF bolus. Will continue to monitor.

## 2021-10-22 NOTE — Op Note (Signed)
DATE OF SURGERY:  10/22/2021  TIME: 4:31 PM  PATIENT NAME:  Jessica Cochran  AGE: 61 y.o.  PRE-OPERATIVE DIAGNOSIS: Right reverse obliquity intertrochanteric/subtrochanteric hip fracture  POST-OPERATIVE DIAGNOSIS:  SAME  PROCEDURE: Intramedullary nail fixation, right intertrochanteric hip fracture  SURGEON:  Johnny Bridge  ASSISTANT:  Merlene Pulling, PA-C, present and scrubbed throughout the case, critical for assistance with exposure, retraction, instrumentation, and closure.  OPERATIVE IMPLANTS:  Implant Name Type Inv. Item Serial No. Manufacturer Lot No. LRB No. Used Action  NAIL IM TFNA [id number] 130D RT - [id number] Nail NAIL IM TFNA [id number] 130D RT  [employer] [id number] Right 1 Implanted  BLADE TFNA HELICAL 85 - [id number] Anchor BLADE TFNA HELICAL 85  [employer]  Right 1 Implanted  SCREW LOCK IM TI 5X30 - [id number] Screw SCREW LOCK IM TI 5X30  [employer]  Right 1 Implanted    UNIQUE ASPECTS OF THE CASE: Bone quality was fair.  The neck was fairly valgus.  The guidepin wanted to be inferior in the head, which I ultimately excepted recognizing that there was plenty of good superior bone supporting the nail preventing cut out.  ESTIMATED BLOOD LOSS: 50 mL  PREOPERATIVE INDICATIONS:  Jessica Cochran is a 61 y.o. year old who fell and suffered a hip fracture. She was brought into the ER and then admitted and optimized and then elected for surgical intervention.    The risks benefits and alternatives were discussed with the patient including but not limited to the risks of nonoperative treatment, versus surgical intervention including infection, bleeding, nerve injury, malunion, nonunion, hardware prominence, hardware failure, need for hardware removal, blood clots, cardiopulmonary complications, morbidity, mortality, among others, and they were willing to proceed.    OPERATIVE PROCEDURE:  The patient was brought to the operating room and placed in the  supine position. Anesthesia was administered. She was placed on the fracture table.  Closed reduction was performed under C-arm guidance.  Time out was then performed after sterile prep and drape. She received preoperative antibiotics.  Incision was made proximal to the greater trochanter. A guidewire was placed in the appropriate position. Confirmation was made on AP and lateral views.  The above-named nail was opened. I opened the proximal femur with a reamer. I then placed the nail by hand down. I did not need to ream the femur.  I did use a medium length nail in order to make sure that I had adequate support intramedullary fixation distal to the fracture.  Once the nail was completely seated, I placed a guidepin into the femoral head into the center center position. I measured the length, and then reamed the lateral cortex and up into the head. I then placed the cephalomedullary screw. Slight compression was applied. Anatomic fixation achieved.   I then secured the proximal interlocking bolt, and locked the nail distally using the jig.  I took final C-arm pictures AP and lateral.   Anatomic reconstruction was achieved, and the wounds were irrigated copiously and closed with Vicryl followed by Steri-Strips and sterile gauze for the skin. The patient was awakened and returned to PACU in stable and satisfactory condition. There were no complications and the patient tolerated the procedure well.  She will be weightbearing as tolerated, and will be on chemoprophylaxis for a period of four weeks after discharge.   Marchia Bond, M.D.

## 2021-10-22 NOTE — Assessment & Plan Note (Addendum)
   Patient suffered a mechanical fall resulting in a intertrochanteric right hip fracture  ER provider discussed case with Dr. Mardelle Matte with orthopedic surgery who will evaluate patient in consultation  Possible operative intervention on 8/15  Will keep patient n.p.o. for now   Gentle intravenous hydration  As needed intravenous opiate-based analgesics for substantial associated pain superimposed on basal Xtampza therapy  Obtaining vitamin D level

## 2021-10-22 NOTE — Discharge Instructions (Signed)
Diet: As you were doing prior to hospitalization   Shower:  May shower but keep the wounds dry, use an occlusive plastic wrap, NO SOAKING IN TUB.  If the bandage gets wet, change with a clean dry gauze.  If you have a splint on, leave the splint in place and keep the splint dry with a plastic bag.  Dressing:  You may change your dressing 3-5 days after surgery, unless you have a splint.  If you have a splint, then just leave the splint in place and we will change your bandages during your first follow-up appointment.    If you had hand or foot surgery, we will plan to remove your stitches in about 2 weeks in the office.  For all other surgeries, there are sticky tapes (steri-strips) on your wounds and all the stitches are absorbable.  Leave the steri-strips in place when changing your dressings, they will peel off with time, usually 2-3 weeks.  Activity:  Increase activity slowly as tolerated, but follow the weight bearing instructions below.  The rules on driving is that you can not be taking narcotics while you drive, and you must feel in control of the vehicle.    Weight Bearing:  weight bearing as tolerated on right leg  To prevent constipation: you may use a stool softener such as -  Colace (over the counter) 100 mg by mouth twice a day  Drink plenty of fluids (prune juice may be helpful) and high fiber foods Miralax (over the counter) for constipation as needed.    Itching:  If you experience itching with your medications, try taking only a single pain pill, or even half a pain pill at a time.  You may take up to 10 pain pills per day, and you can also use benadryl over the counter for itching or also to help with sleep.   Precautions:  If you experience chest pain or shortness of breath - call 911 immediately for transfer to the hospital emergency department!!  If you develop a fever greater that 101 F, purulent drainage from wound, increased redness or drainage from wound, or calf pain  -- Call the office at [phone]                                                Follow- Up Appointment:  Please call for an appointment to be seen in 2 weeks [place] - [phone]

## 2021-10-22 NOTE — ED Notes (Signed)
ED TO INPATIENT HANDOFF REPORT  Name/Age/Gender Jessica Cochran 61 y.o. female  Code Status   Home/SNF/Other Home  Chief Complaint Closed right hip fracture, initial encounter ([place]) [S72.001A]  Level of Care/Admitting Diagnosis ED Disposition     ED Disposition  Admit   Condition  --   [place]: [place] [[id number]]  Level of Care: Med-Surg [16]  May admit patient to [facility] or [facility] if equivalent level of care is available:: No  Covid Evaluation: Asymptomatic - no recent exposure (last 10 days) testing not required  Diagnosis: Closed right hip fracture, initial encounter [facility]) [[id number]]  Admitting Physician: Vernelle Emerald [[id number]]  Attending Physician: Vernelle Emerald [[id number]]  Certification:: I certify this patient will need inpatient services for at least 2 midnights  Estimated Length of Stay: 3          Medical History Past Medical History:  Diagnosis Date   Arthritis    Cough 06/09/2014   Dysuria 06/09/2014   Herpes zoster infection 04/29/2011   Multiple myeloma in relapse ([place]) 10/12/2011   08/19/11: increase in kappa serum free light chains; 7/11: elevation urine protein (157 mg/24 hr) and reappearance monoclonal kappa light chains; 7/18: bone marrow biopsy 4% plasma cells; bone X-rays: no new lesions   Multiple myeloma in remission ([place]) 04/29/2011   Pelvic mass in female 08/28/2014   Renal stone    Yeast infection 06/08/2015    Allergies Allergies  Allergen Reactions   Hydromorphone Nausea Only    IV Location/Drains/Wounds Patient Lines/Drains/Airways Status     Active Line/Drains/Airways     Name Placement date Placement time Site Days   Implanted Port --  --  --  --   Implanted Port 08/06/07 Right Chest 08/06/07  0000  Chest  5191   Peripheral IV 10/21/21 22 G Left Antecubital 10/21/21  --  Antecubital  1   Peripheral IV (Ped) 09/25/11 Forearm 09/25/11  0735  -- 3680   External Urinary  Catheter 10/21/21  2102  --  1            Labs/Imaging Results for orders placed or performed during the hospital encounter of 10/21/21 (from the past 48 hour(s))  Comprehensive metabolic panel     Status: Abnormal   Collection Time: 10/21/21 10:30 PM  Result Value Ref Range   Sodium 141 135 - 145 mmol/L   Potassium 4.2 3.5 - 5.1 mmol/L   Chloride 107 98 - 111 mmol/L   CO2 27 22 - 32 mmol/L   Glucose, Bld 102 (H) 70 - 99 mg/dL    Comment: Glucose reference range applies only to samples taken after fasting for at least 8 hours.   BUN 15 8 - 23 mg/dL   Creatinine, Ser 0.85 0.44 - 1.00 mg/dL   Calcium 8.6 (L) 8.9 - 10.3 mg/dL   Total Protein 5.8 (L) 6.5 - 8.1 g/dL   Albumin 4.1 3.5 - 5.0 g/dL   AST 31 15 - 41 U/L   ALT 16 0 - 44 U/L   Alkaline Phosphatase 42 38 - 126 U/L   Total Bilirubin 0.5 0.3 - 1.2 mg/dL   GFR, Estimated >60 >60 mL/min    Comment: (NOTE) Calculated using the CKD-EPI Creatinine Equation (2021)    Anion gap 7 5 - 15    Comment: Performed at [facility], [place] [address] [place], [place] [address]  CBC with Differential     Status: Abnormal  Collection Time: 10/21/21 10:30 PM  Result Value Ref Range   WBC 7.9 4.0 - 10.5 K/uL   RBC 3.34 (L) 3.87 - 5.11 MIL/uL   Hemoglobin 12.3 12.0 - 15.0 g/dL   HCT 35.6 (L) 36.0 - 46.0 %   MCV 106.6 (H) 80.0 - 100.0 fL   MCH 36.8 (H) 26.0 - 34.0 pg   MCHC 34.6 30.0 - 36.0 g/dL   RDW 12.0 11.5 - 15.5 %   Platelets 106 (L) 150 - 400 K/uL    Comment: SPECIMEN CHECKED FOR CLOTS   nRBC 0.0 0.0 - 0.2 %   Neutrophils Relative % 91 %   Neutro Abs 7.1 1.7 - 7.7 K/uL   Lymphocytes Relative 4 %   Lymphs Abs 0.3 (L) 0.7 - 4.0 K/uL   Monocytes Relative 5 %   Monocytes Absolute 0.4 0.1 - 1.0 K/uL   Eosinophils Relative 0 %   Eosinophils Absolute 0.0 0.0 - 0.5 K/uL   Basophils Relative 0 %   Basophils Absolute 0.0 0.0 - 0.1 K/uL   Immature Granulocytes 0 %   Abs Immature Granulocytes 0.03 0.00 - 0.07 K/uL     Comment: Performed at [facility], [place] [address] [place], [place] [address]   *Note: Due to a large number of results and/or encounters for the requested time period, some results have not been displayed. A complete set of results can be found in Results Review.   DG Femur Min 2 Views Right  Result Date: 10/21/2021 CLINICAL DATA:  Right hip pain after fall EXAM: RIGHT FEMUR 2 VIEWS; PELVIS - 1-2 VIEW COMPARISON:  None Available.  CT abdomen and pelvis 08/31/2014 FINDINGS: Acute mildly displaced intertrochanteric fracture of the right femur with medial displacement of the lesser trochanter. No hip dislocation. No additional fractures. Degenerative changes pubic symphysis, both hips, SI joints and lower lumbar spine. IMPRESSION: Right intertrochanteric femur fracture. Electronically Signed   By: Placido Sou M.D.   On: 10/21/2021 21:40   DG Pelvis 1-2 Views  Result Date: 10/21/2021 CLINICAL DATA:  Right hip pain after fall EXAM: RIGHT FEMUR 2 VIEWS; PELVIS - 1-2 VIEW COMPARISON:  None Available.  CT abdomen and pelvis 08/31/2014 FINDINGS: Acute mildly displaced intertrochanteric fracture of the right femur with medial displacement of the lesser trochanter. No hip dislocation. No additional fractures. Degenerative changes pubic symphysis, both hips, SI joints and lower lumbar spine. IMPRESSION: Right intertrochanteric femur fracture. Electronically Signed   By: Placido Sou M.D.   On: 10/21/2021 21:40    Pending Labs Unresulted Labs (From admission, onward)    None       Vitals/Pain Today's Vitals   10/22/21 0045 10/22/21 0100 10/22/21 0115 10/22/21 0130  BP: 114/72 106/71 117/74 122/81  Pulse: 87 81 85 89  Resp: _0 Temp:      TempSrc:      SpO2: 95% 94% 96% 99%  Weight:      Height:      PainSc:        Isolation Precautions No active isolations  Medications Medications  cholecalciferol (VITAMIN D3) 25 MCG (1000 UNIT) tablet 1,000 Units  (has no administration in time range)  oxyCODONE (OXYCONTIN) 12 hr tablet 30 mg (30 mg Oral Not Given 10/22/21 0212)  pantoprazole (PROTONIX) EC tablet 40 mg (has no administration in time range)  sulfamethoxazole-trimethoprim (BACTRIM DS) 800-160 MG per tablet 1 tablet (has no administration in time range)  valACYclovir (VALTREX) tablet 1,000 mg (has no  administration in time range)  ondansetron [facility]) injection 4 mg (4 mg Intravenous Given 10/21/21 2105)  morphine (PF) 4 MG/ML injection 4 mg (4 mg Intravenous Given 10/21/21 2106)  fentaNYL (SUBLIMAZE) injection 50 mcg (50 mcg Intravenous Given 10/21/21 2144)  morphine (PF) 4 MG/ML injection 4 mg (4 mg Intravenous Given 10/21/21 2319)

## 2021-10-22 NOTE — Assessment & Plan Note (Signed)
   Chronic, related to patient's longstanding history of multiple myeloma, now on remission status post CAR-T at [facility] in 2021  No clinical evidence of bleeding  Monitoring platelet counts with serial CBCs

## 2021-10-22 NOTE — Progress Notes (Signed)
  Progress Note   Patient: Jessica Cochran OVA:[id number] DOB: Apr 05, 1960 DOA: 10/21/2021     1 DOS: the patient was seen and examined on 10/22/2021 at 10:55AM      Brief hospital course: This is a no charge note, for further details please see the H&P by Dr. Cyd Silence from earlier today.     Assessment and Plan: * Closed right hip fracture, initial encounter (HCC)-resolved as of 10/22/2021 - Consult Orthopedics, appreciate Dr. Luanna Cole expert care   History of herpes zoster - Continue home Valtrex  Thrombocytopenia ([place])  History of recurrent UTI (urinary tract infection)  Chronic pain syndrome - Continue home [place]         Subjective:       Physical Exam: Vitals:   10/22/21 1156 10/22/21 1446 10/22/21 1649 10/22/21 1700  BP: 128/73 138/76 (!) 124/90 103/81  Pulse: 86 97 (!) 104 99  Resp: '18 16 14 15  '$ Temp: 99 F (37.2 C) 97.7 F (36.5 C) 97.6 F (36.4 C)   TempSrc: Oral Oral    SpO2: 94% 97% 100% 93%  Weight:  52.2 kg    Height:  '4\' 9"'$  (1.448 m)          Disposition: Status is: Inpatient         Author: Edwin Dada, MD 10/22/2021 5:03 PM  For on call review [email or web address].

## 2021-10-22 NOTE — ED Notes (Signed)
Patient reports she took home pain medication and home breakthrough pain medication.

## 2021-10-22 NOTE — ED Notes (Signed)
Per pharmacy, patient's meds will have to be sent to them so they can label and put barcodes on them.

## 2021-10-23 ENCOUNTER — Encounter (HOSPITAL_COMMUNITY): Payer: Self-pay | Admitting: Orthopedic Surgery

## 2021-10-23 ENCOUNTER — Other Ambulatory Visit: Payer: Self-pay

## 2021-10-23 DIAGNOSIS — S72001A Fracture of unspecified part of neck of right femur, initial encounter for closed fracture: Secondary | ICD-10-CM | POA: Diagnosis not present

## 2021-10-23 LAB — CBC WITH DIFFERENTIAL/PLATELET
Abs Immature Granulocytes: 0.03 10*3/uL (ref 0.00–0.07)
Basophils Absolute: 0 10*3/uL (ref 0.0–0.1)
Basophils Relative: 0 %
Eosinophils Absolute: 0 10*3/uL (ref 0.0–0.5)
Eosinophils Relative: 1 %
HCT: 25 % — ABNORMAL LOW (ref 36.0–46.0)
Hemoglobin: 8.4 g/dL — ABNORMAL LOW (ref 12.0–15.0)
Immature Granulocytes: 1 %
Lymphocytes Relative: 8 %
Lymphs Abs: 0.4 10*3/uL — ABNORMAL LOW (ref 0.7–4.0)
MCH: 36.5 pg — ABNORMAL HIGH (ref 26.0–34.0)
MCHC: 33.6 g/dL (ref 30.0–36.0)
MCV: 108.7 fL — ABNORMAL HIGH (ref 80.0–100.0)
Monocytes Absolute: 0.7 10*3/uL (ref 0.1–1.0)
Monocytes Relative: 13 %
Neutro Abs: 4.4 10*3/uL (ref 1.7–7.7)
Neutrophils Relative %: 77 %
Platelets: 98 10*3/uL — ABNORMAL LOW (ref 150–400)
RBC: 2.3 MIL/uL — ABNORMAL LOW (ref 3.87–5.11)
RDW: 12 % (ref 11.5–15.5)
WBC: 5.6 10*3/uL (ref 4.0–10.5)
nRBC: 0 % (ref 0.0–0.2)

## 2021-10-23 LAB — COMPREHENSIVE METABOLIC PANEL
ALT: 15 U/L (ref 0–44)
AST: 24 U/L (ref 15–41)
Albumin: 3 g/dL — ABNORMAL LOW (ref 3.5–5.0)
Alkaline Phosphatase: 32 U/L — ABNORMAL LOW (ref 38–126)
Anion gap: 6 (ref 5–15)
BUN: 11 mg/dL (ref 8–23)
CO2: 27 mmol/L (ref 22–32)
Calcium: 7.8 mg/dL — ABNORMAL LOW (ref 8.9–10.3)
Chloride: 106 mmol/L (ref 98–111)
Creatinine, Ser: 0.72 mg/dL (ref 0.44–1.00)
GFR, Estimated: >60 mL/min (ref >60)
Glucose, Bld: 148 mg/dL — ABNORMAL HIGH (ref 70–99)
Potassium: 3.9 mmol/L (ref 3.5–5.1)
Sodium: 139 mmol/L (ref 135–145)
Total Bilirubin: 0.3 mg/dL (ref 0.3–1.2)
Total Protein: 4.5 g/dL — ABNORMAL LOW (ref 6.5–8.1)

## 2021-10-23 LAB — MAGNESIUM: Magnesium: 1.8 mg/dL (ref 1.7–2.4)

## 2021-10-23 MED ORDER — ENOXAPARIN SODIUM 40 MG/0.4ML IJ SOSY: 40.0000 mg | PREFILLED_SYRINGE | INTRAMUSCULAR | Status: DC

## 2021-10-23 MED ORDER — ENOXAPARIN SODIUM 40 MG/0.4ML IJ SOSY
40.0000 mg | PREFILLED_SYRINGE | INTRAMUSCULAR | Status: DC
  Administered 2021-10-23: 40 mg via SUBCUTANEOUS
  Filled 2021-10-23: qty 0.4

## 2021-10-23 MED ORDER — SODIUM CHLORIDE 0.9 % IV BOLUS
500.0000 mL | Freq: Once | INTRAVENOUS | Status: AC
  Administered 2021-10-23: 500 mL via INTRAVENOUS

## 2021-10-23 NOTE — Progress Notes (Signed)
   10/22/21 2117  Assess: MEWS Score  Temp 99.7 F (37.6 C)  BP 94/73  MAP (mmHg) 81  Pulse Rate (!) 104  Resp 18  SpO2 100 %  O2 Device Nasal Cannula  O2 Flow Rate (L/min) 2 L/min  Assess: MEWS Score  MEWS Temp 0  MEWS Systolic 1  MEWS Pulse 1  MEWS RR 0  MEWS LOC 0  MEWS Score 2  MEWS Score Color Yellow  Treat  MEWS Interventions Administered scheduled meds/treatments  Take Vital Signs  Increase Vital Sign Frequency  Yellow: Q 2hr X 2 then Q 4hr X 2, if remains yellow, continue Q 4hrs  Escalate  MEWS: Escalate Yellow: discuss with charge nurse/RN and consider discussing with provider and RRT  Notify: Charge Nurse/RN  Name of Charge Nurse/RN Notified Wilhelmina Hark,RN/ [place] RN  Date Charge Nurse/RN Notified 10/22/21  Time Charge Nurse/RN Notified 2117  Notify: Provider  Provider Name/Title Olena Heckle NP  Date Provider Notified 10/22/21  Time Provider Notified 2238  Method of Notification Page  Notification Reason Change in status  Provider response See new orders  Date of Provider Response 10/22/21  Time of Provider Response 2240  Document  Patient Outcome Stabilized after interventions  Progress note created (see row info) Yes  Assess: SIRS CRITERIA  SIRS Temperature  0  SIRS Pulse 1  SIRS Respirations  0  SIRS WBC 1  SIRS Score Sum  2

## 2021-10-23 NOTE — Anesthesia Postprocedure Evaluation (Signed)
Anesthesia Post Note  Patient: Jessica Cochran  Procedure(s) Performed: INTRAMEDULLARY (IM) NAIL INTERTROCHANTERIC (Right)     Patient location during evaluation: PACU Anesthesia Type: General Level of consciousness: sedated and patient cooperative Pain management: pain level controlled Vital Signs Assessment: post-procedure vital signs reviewed and stable Respiratory status: spontaneous breathing Cardiovascular status: stable Anesthetic complications: no   No notable events documented.  Last Vitals:  Vitals:   10/22/21 2117 10/22/21 2239  BP: 94/73 (!) 83/71  Pulse: (!) 104 (!) 108  Resp: 18 16  Temp: 37.6 C 37 C  SpO2: 100% 100%    Last Pain:  Vitals:   10/22/21 2239  TempSrc: Oral  PainSc:                  [name]

## 2021-10-23 NOTE — TOC Initial Note (Signed)
Transition of Care [facility]) - Initial/Assessment Note   Patient Details  Name: Jessica Cochran MRN: [id number] Date of Birth: [birth year]  [facility]) CM/SW Contact:    Sherie Don, LCSW Phone Number: 10/23/2021, 2:44 PM  Clinical Narrative: PT evaluation recommended HHPT. CSW met with patient and her husband, Jessica Cochran, to discuss recommendations. Patient is agreeable to [facility] referral. CSW made referrals to the following agencies:  [facility]: declined, no PT available in area [place]: declined [facility]: declined [facility]: out-of-network [facility]: declined [facility]: declined [facility]: accepted  TOC awaiting OT evaluation and possible DME recommendations.  Expected Discharge Plan: [place] Barriers to Discharge: Continued Medical Work up  Patient Goals and CMS Choice Patient states their goals for this hospitalization and ongoing recovery are:: Return home CMS Medicare.gov Compare Post Acute Care list provided to:: Patient Choice offered to / list presented to : Patient  Expected Discharge Plan and Services Expected Discharge Plan: [place] In-house Referral: Clinical Social Work Post Acute Care Choice: [place] arrangements for the past 2 months: [place]: PT [place]: [place] Date [place]: 10/23/21 Time [place]: 9735 Representative spoke with at [place]: [place] Arrangements/Services Living arrangements for the past 2 months: [place] with:: Spouse Patient language and need for interpreter reviewed:: Yes Do you feel safe going back to the place where you live?: Yes      Need for Family Participation in Patient Care: No (Comment) Care giver support system in place?: Yes (comment) Criminal Activity/Legal Involvement Pertinent to Current Situation/Hospitalization: No - Comment as needed  Activities of Daily  Living Home Assistive Devices/Equipment: Eyeglasses ADL Screening (condition at time of admission) Patient's cognitive ability adequate to safely complete daily activities?: Yes Is the patient deaf or have difficulty hearing?: No Does the patient have difficulty seeing, even when wearing glasses/contacts?: No Does the patient have difficulty concentrating, remembering, or making decisions?: No Patient able to express need for assistance with ADLs?: Yes Does the patient have difficulty dressing or bathing?: No Independently performs ADLs?: Yes (appropriate for developmental age) Does the patient have difficulty walking or climbing stairs?: Yes Weakness of Legs: Right (rt hip fracture) Weakness of Arms/Hands: None  Permission Sought/Granted Permission sought to share information with : Other (comment) Permission granted to share information with : Yes, Verbal Permission Granted Permission granted to share info w AGENCY: [place] agencies  Emotional Assessment Appearance:: Appears stated age Attitude/Demeanor/Rapport: Engaged Affect (typically observed): Accepting Orientation: : Oriented to Self, Oriented to Place, Oriented to  Time, Oriented to Situation Alcohol / Substance Use: Not Applicable Psych Involvement: No (comment)  Admission diagnosis:  Closed right hip fracture, initial encounter [facility]) [S72.001A] Patient Active Problem List   Diagnosis Date Noted   Chronic pain syndrome 10/22/2021   S/P right hip fracture 10/22/2021   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   Palpitations with regular cardiac rhythm 01/14/2019   Encounter for antineoplastic chemotherapy 11/19/2018   Pancytopenia, acquired ([place]) 09/10/2017   Right leg swelling 07/24/2017   Lesion of colon 09/28/2016   Goals of care, counseling/discussion 08/27/2016   Drug-induced neutropenia ([place]) 04/11/2016   Port catheter in place 07/12/2015   Pancytopenia due to antineoplastic chemotherapy ([place]) 07/12/2015   Chronic back pain  greater than 3 months duration 05/31/2015   Pelvic mass in female 08/28/2014   Rectal discharge 06/11/2014   History of recurrent UTI (urinary tract  infection) 10/26/2013   Hypogammaglobulinemia, 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   Thrombocytopenia ([place]) 10/26/2013   Multiple myeloma in remission ([place]) 04/29/2011   History of herpes zoster 04/29/2011   Chronic back pain 04/29/2011   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

## 2021-10-23 NOTE — Progress Notes (Signed)
PROGRESS NOTE    Jessica Cochran  NTZ:[id number] DOB: 1961/01/07 DOA: 10/21/2021 PCP: System, Provider Not In    Brief Narrative:   Jessica Cochran is a 61 y.o. female with past medical history significant for kappa light chain/multiple myeloma, chronic pain syndrome, history of herpes zoster, thrombocytopenia, GERD who presented to [facility] ED on 8/14 via EMS with complaints of right hip pain following mechanical fall.  Patient was shopping and fell and landed on her right side complaining about immediate pain.  Patient denies hitting her head or loss of consciousness.    In the ED, sodium 141, potassium 4.2, chloride 107, bicarb 27, BUN 15, creatinine 0.85.  WBC 7.9, hemoglobin 12.3, platelet count 106.  Pelvic x-ray revealing right intertrochanteric femur fracture.  Orthopedics was consulted.  TRH consulted for further evaluation management of hip fracture.  Assessment & Plan:   Closed right hip fracture, initial encounter Patient presenting to ED following mechanical fall.  Pelvis x-ray notable for right intertrochanteric femur fracture.  Orthopedics was consulted and patient underwent ORIF with IMN by Dr. Mardelle Matte on 10/22/2021.  Vitamin D 25-hydroxy 30.03. --Orthopedics following, appreciate assistance --WBAT RLE --Lovenox postoperative DVT prophylaxis per orthopedics x4 weeks --Continue oxycodone 30 mg PO q12h (on Xtampza 7m q12h at home) --oxycodone IR 5-125mPO q4h PRN moderate pain --morpine 0.5-29m62mV q2h PRN severe breakthrough pain --Baxan 5 mg p.o. every 8 hours.  Muscle spasms --Pending PT/OT evaluation  Postoperative blood loss anemia Hemoglobin 12.3 on admission, down to 8.4 postoperatively.  Received tranexamic acid perioperatively --Continue ferrous sulfate 325 mg p.o. TID --Transfuse for hemoglobin less than 7.0 --cbc daily  Hx multiple myeloma --Outpatient follow-up with oncology  Chronic pain syndrome On Xtampza outpatient.  History of recurrent herpes  zoster --Continue Valtrex 1000 mg p.o. daily  GERD: Continue Protonix 40 mg p.o. daily   DVT prophylaxis: SCDs Start: 10/22/21 1748 SCDs Start: 10/22/21 0716    Code Status: Full Code Family Communication: No family present at bedside this morning  Disposition Plan:  Level of care: Med-Surg Status is: Inpatient Remains inpatient appropriate because: Pending PT/OT evaluation, may need SNF placement    Consultants:  Orthopedics, Dr. LanMardelle Matterocedures:  ORIF, IMN right femur, Dr. LanMardelle Matte15  Antimicrobials:  Perioperative cefazolin   Subjective: Patient seen examined bedside, resting comfortably.  No complaints currently.  Underwent operative management of her hip fracture yesterday.  Awaiting PT/OT evaluation.  No questions or concerns.  Denies headache, no dizziness, no chest pain, no palpitations, no shortness of breath, no abdominal pain, no fever/chills/night sweats, no nausea/vomiting/diarrhea, no focal weakness, no fatigue, no paresthesias.  No acute events overnight per nursing staff  Objective: Vitals:   10/23/21 0150 10/23/21 0217 10/23/21 0533 10/23/21 0925  BP: (!) 83/59 (!) 89/66 (!) 94/57 (!) 81/58  Pulse: (!) 105 89 81 99  Resp: 16  16 16   Temp: 97.6 F (36.4 C)  98.2 F (36.8 C) 98.2 F (36.8 C)  TempSrc: Oral  Oral   SpO2: 100%  100% 96%  Weight:      Height:        Intake/Output Summary (Last 24 hours) at 10/23/2021 1314 Last data filed at 10/23/2021 0926 Gross per 24 hour  Intake 2597.85 ml  Output 1450 ml  Net 1147.85 ml   Filed Weights   10/21/21 2058 10/22/21 1446  Weight: 49.9 kg 52.2 kg    Examination:  Physical Exam: GEN: NAD, alert and oriented x 3, wd/wn HEENT: NCAT, PERRL,  EOMI, sclera clear, MMM PULM: CTAB w/o wheezes/crackles, normal respiratory effort, on room air CV: RRR w/o M/G/R GI: abd soft, NTND, NABS, no R/G/M MSK: no peripheral edema, muscle strength globally intact 5/5 bilateral upper/lower extremities, surgical  dressings noted in place, no surrounding erythema/fluctuance or significant ecchymosis, dressings clean/dry/intact NEURO: CN II-XII intact, no focal deficits, sensation to light touch intact PSYCH: normal mood/affect Integumentary: Local dressings/operative site noted as above, otherwise no other concerning rashes/lesions/wounds     Data Reviewed: I have personally reviewed following labs and imaging studies  CBC: Recent Labs  Lab 10/21/21 2230 10/23/21 0952  WBC 7.9 5.6  NEUTROABS 7.1 4.4  HGB 12.3 8.4*  HCT 35.6* 25.0*  MCV 106.6* 108.7*  PLT 106* 98*   Basic Metabolic Panel: Recent Labs  Lab 10/21/21 2230 10/23/21 0526  NA 141 139  K 4.2 3.9  CL 107 106  CO2 27 27  GLUCOSE 102* 148*  BUN 15 11  CREATININE 0.85 0.72  CALCIUM 8.6* 7.8*  MG  --  1.8   GFR: Estimated Creatinine Clearance: 51.3 mL/min (by C-G formula based on SCr of 0.72 mg/dL). Liver Function Tests: Recent Labs  Lab 10/21/21 2230 10/23/21 0526  AST 31 24  ALT 16 15  ALKPHOS 42 32*  BILITOT 0.5 0.3  PROT 5.8* 4.5*  ALBUMIN 4.1 3.0*   No results for input(s): "LIPASE", "AMYLASE" in the last 168 hours. No results for input(s): "AMMONIA" in the last 168 hours. Coagulation Profile: No results for input(s): "INR", "PROTIME" in the last 168 hours. Cardiac Enzymes: No results for input(s): "CKTOTAL", "CKMB", "CKMBINDEX", "TROPONINI" in the last 168 hours. BNP (last 3 results) No results for input(s): "PROBNP" in the last 8760 hours. HbA1C: No results for input(s): "HGBA1C" in the last 72 hours. CBG: No results for input(s): "GLUCAP" in the last 168 hours. Lipid Profile: No results for input(s): "CHOL", "HDL", "LDLCALC", "TRIG", "CHOLHDL", "LDLDIRECT" in the last 72 hours. Thyroid Function Tests: No results for input(s): "TSH", "T4TOTAL", "FREET4", "T3FREE", "THYROIDAB" in the last 72 hours. Anemia Panel: No results for input(s): "VITAMINB12", "FOLATE", "FERRITIN", "TIBC", "IRON", "RETICCTPCT"  in the last 72 hours. Sepsis Labs: No results for input(s): "PROCALCITON", "LATICACIDVEN" in the last 168 hours.  Recent Results (from the past 240 hour(s))  MRSA Next Gen by PCR, Nasal     Status: None   Collection Time: 10/22/21  4:04 AM   Specimen: Nasal Mucosa; Nasal Swab  Result Value Ref Range Status   MRSA by PCR Next Gen NOT DETECTED NOT DETECTED Final    Comment: (NOTE) The GeneXpert MRSA Assay (FDA approved for NASAL specimens only), is one component of a comprehensive MRSA colonization surveillance program. It is not intended to diagnose MRSA infection nor to guide or monitor treatment for MRSA infections. Test performance is not FDA approved in patients less than 16 years old. Performed at [facility], [place] [address] [place], [place] [address]          Radiology Studies: DG Hip Port Unilat With Pelvis 1V Right  Result Date: 10/22/2021 CLINICAL DATA:  Status post surgical internal fixation of right hip fracture. EXAM: DG HIP (WITH OR WITHOUT PELVIS) 1V PORT RIGHT COMPARISON:  October 21, 2021. FINDINGS: Status post surgical internal fixation of proximal right femoral intertrochanteric fracture. Good alignment of fracture components is noted. Expected postoperative changes are noted in the surrounding soft tissues. IMPRESSION: Status post surgical internal fixation of proximal right femoral intertrochanteric fracture. Electronically Signed   By:  Marijo Conception M.D.   On: 10/22/2021 17:32   DG C-Arm 1-60 Min-No Report  Result Date: 10/22/2021 CLINICAL DATA:  Intramedullary rod fixation of right femur. EXAM: RIGHT FEMUR 2 VIEWS; DG C-ARM 1-60 MIN-NO REPORT Radiation exposure index: 8.2057 mGy. COMPARISON:  October 21, 2021. FINDINGS: Five intraoperative fluoroscopic images were obtained the right hip. These images demonstrate surgical internal fixation of intertrochanteric fracture involving proximal right femur. IMPRESSION: Fluoroscopic guidance provided  during surgical internal fixation of proximal right femoral intertrochanteric fracture. Electronically Signed   By: Marijo Conception M.D.   On: 10/22/2021 17:17   DG FEMUR, MIN 2 VIEWS RIGHT  Result Date: 10/22/2021 CLINICAL DATA:  Intramedullary rod fixation of right femur. EXAM: RIGHT FEMUR 2 VIEWS; DG C-ARM 1-60 MIN-NO REPORT Radiation exposure index: 8.2057 mGy. COMPARISON:  October 21, 2021. FINDINGS: Five intraoperative fluoroscopic images were obtained the right hip. These images demonstrate surgical internal fixation of intertrochanteric fracture involving proximal right femur. IMPRESSION: Fluoroscopic guidance provided during surgical internal fixation of proximal right femoral intertrochanteric fracture. Electronically Signed   By: Marijo Conception M.D.   On: 10/22/2021 17:17   DG Femur Min 2 Views Right  Result Date: 10/21/2021 CLINICAL DATA:  Right hip pain after fall EXAM: RIGHT FEMUR 2 VIEWS; PELVIS - 1-2 VIEW COMPARISON:  None Available.  CT abdomen and pelvis 08/31/2014 FINDINGS: Acute mildly displaced intertrochanteric fracture of the right femur with medial displacement of the lesser trochanter. No hip dislocation. No additional fractures. Degenerative changes pubic symphysis, both hips, SI joints and lower lumbar spine. IMPRESSION: Right intertrochanteric femur fracture. Electronically Signed   By: Placido Sou M.D.   On: 10/21/2021 21:40   DG Pelvis 1-2 Views  Result Date: 10/21/2021 CLINICAL DATA:  Right hip pain after fall EXAM: RIGHT FEMUR 2 VIEWS; PELVIS - 1-2 VIEW COMPARISON:  None Available.  CT abdomen and pelvis 08/31/2014 FINDINGS: Acute mildly displaced intertrochanteric fracture of the right femur with medial displacement of the lesser trochanter. No hip dislocation. No additional fractures. Degenerative changes pubic symphysis, both hips, SI joints and lower lumbar spine. IMPRESSION: Right intertrochanteric femur fracture. Electronically Signed   By: Placido Sou M.D.    On: 10/21/2021 21:40        Scheduled Meds:  acetaminophen  1,000 mg Oral TID   cholecalciferol  1,000 Units Oral Daily   docusate sodium  100 mg Oral BID   feeding supplement  237 mL Oral BID BM   ferrous sulfate  325 mg Oral TID PC   oxyCODONE  30 mg Oral Q12H   pantoprazole  40 mg Oral Daily   senna  1 tablet Oral BID   sulfamethoxazole-trimethoprim  1 tablet Oral Once per day on Mon Wed Fri   valACYclovir  1,000 mg Oral Daily   Continuous Infusions:  sodium chloride       LOS: 2 days    Time spent: 48 minutes spent on chart review, discussion with nursing staff, consultants, updating family and interview/physical exam; more than 50% of that time was spent in counseling and/or coordination of care.    Taygen Acklin J [place] DO [facility] Available via Epic secure chat 7am-7pm After these hours, please refer to coverage provider listed on [email or web address] 10/23/2021, 1:14 PM

## 2021-10-23 NOTE — Evaluation (Signed)
Occupational Therapy Evaluation Patient Details Name: Jessica Cochran MRN: [id number] DOB: [birth year] Today's Date: 10/23/2021   History of Present Illness 61 year old female with past medical history of kappa light chain relapsed and refractory multiple myeloma who follows with Dr. Alvy Bimler as well as [facility] myeloma clinic status post CAR-T in 2021 at [facility], recurrent varicella-zoster virus infections, gastroesophageal reflux disease who presented to [facility] hospital emergency department via EMS status post mechanical fall.  Pt s/p Right intertrochanteric IM nail on 10/22/21.   Clinical Impression   Jessica Cochran is a 61 year old woman s/p hip fracture repair with decreased ROM and strength of RLE, impaired balance, decreased activity tolerance and intermittent complaints of pain resulting in a sudden decline in functional mobility and ability to perform independent ADLs. Patient min-mod assist for LB ADLs and min guard for ambulation in room. Educated on needed DME and possible AE equipment. Patient will benefit from skilled OT services while in hospital to improve deficits and learn compensatory strategies as needed in order to return to PLOF.  Will f/u tomorrow to educate patient on how to use AE.       Recommendations for follow up therapy are one component of a multi-disciplinary discharge planning process, led by the attending physician.  Recommendations may be updated based on patient status, additional functional criteria and insurance authorization.   Follow Up Recommendations  No OT follow up    Assistance Recommended at Discharge Intermittent Supervision/Assistance  Patient can return home with the following A little help with bathing/dressing/bathroom;Assistance with cooking/housework;Help with stairs or ramp for entrance    Functional Status Assessment  Patient has had a recent decline in their functional status and demonstrates the ability to make significant  improvements in function in a reasonable and predictable amount of time.  Equipment Recommendations  BSC/3in1    Recommendations for Other Services       Precautions / Restrictions Precautions Precautions: Fall Restrictions Weight Bearing Restrictions: No RLE Weight Bearing: Weight bearing as tolerated      Mobility Bed Mobility                    Transfers                          Balance Overall balance assessment: Mild deficits observed, not formally tested                                         ADL either performed or assessed with clinical judgement   ADL Overall ADL's : Needs assistance/impaired Eating/Feeding: Independent   Grooming: Independent   Upper Body Bathing: Independent   Lower Body Bathing: Minimal assistance;Sit to/from stand   Upper Body Dressing : Independent   Lower Body Dressing: Sit to/from stand;Moderate assistance   Toilet Transfer: Supervision/safety   Toileting- Clothing Manipulation and Hygiene: Supervision/safety       Functional mobility during ADLs: Supervision/safety;Rolling walker (2 wheels) General ADL Comments: Verbal instruction for hand and body placement with use of walker. Verbal instruction for safe sit to stand transfer.     Vision Baseline Vision/History: 1 Wears glasses Patient Visual Report: No change from baseline       Perception     Praxis      Pertinent Vitals/Pain Pain Assessment Pain Descriptors / Indicators: Sore     Hand Dominance  Extremity/Trunk Assessment Upper Extremity Assessment Upper Extremity Assessment: Overall WFL for tasks assessed   Lower Extremity Assessment Lower Extremity Assessment: Defer to PT evaluation   Cervical / Trunk Assessment Cervical / Trunk Assessment: Kyphotic   Communication Communication Communication: No difficulties   Cognition Arousal/Alertness: Awake/alert Behavior During Therapy: WFL for tasks  assessed/performed Overall Cognitive Status: Within Functional Limits for tasks assessed                                       General Comments       Exercises     Shoulder Instructions      Home Living Family/patient expects to be discharged to:: Private residence Living Arrangements: Spouse/significant other   Type of Home: House Home Access: Stairs to enter Technical brewer of Steps: 1   Home Layout: Able to live on main level with bedroom/bathroom     Bathroom Shower/Tub: Walk-in shower         Home Equipment: Conservation officer, nature (2 wheels)   Additional Comments: in process of moving, plans to stay in new house as above      Prior Functioning/Environment Prior Level of Function : Independent/Modified Independent                        OT Problem List: Decreased strength;Decreased range of motion;Pain;Impaired balance (sitting and/or standing);Decreased activity tolerance      OT Treatment/Interventions: Self-care/ADL training;Therapeutic exercise;DME and/or AE instruction;Therapeutic activities;Balance training;Patient/family education    OT Goals(Current goals can be found in the care plan section) Acute Rehab OT Goals Patient Stated Goal: to be as independent as possible OT Goal Formulation: With patient Time For Goal Achievement: 11/06/21 Potential to Achieve Goals: Good  OT Frequency: Min 2X/week    Co-evaluation              AM-PAC OT "6 Clicks" Daily Activity     Outcome Measure Help from another person eating meals?: None Help from another person taking care of personal grooming?: None Help from another person toileting, which includes using toliet, bedpan, or urinal?: A Little Help from another person bathing (including washing, rinsing, drying)?: A Little Help from another person to put on and taking off regular upper body clothing?: None Help from another person to put on and taking off regular lower body  clothing?: A Lot 6 Click Score: 20   End of Session Equipment Utilized During Treatment: Rolling walker (2 wheels) Nurse Communication: Mobility status  Activity Tolerance: Patient tolerated treatment well Patient left: in bed;with call bell/phone within reach;with family/visitor present  OT Visit Diagnosis: Other abnormalities of gait and mobility (R26.89)                Time: 4481-8563 OT Time Calculation (min): 24 min Charges:  OT General Charges $OT Visit: 1 Visit OT Evaluation $OT Eval Low Complexity: 1 Low OT Treatments $Self Care/Home Management : 8-22 mins  Savera Donson, OTR/L [place]  Office [phone] Pager: [phone]   Lenward Chancellor 10/23/2021, 3:23 PM

## 2021-10-23 NOTE — Evaluation (Signed)
Physical Therapy Evaluation Patient Details Name: MACKENZIE GROOM MRN: [id number] DOB: 1960/10/09 Today's Date: 10/23/2021  History of Present Illness  61 year old female with past medical history of kappa light chain relapsed and refractory multiple myeloma who follows with Dr. Alvy Bimler as well as [facility] myeloma clinic status post CAR-T in 2021 at [facility], recurrent varicella-zoster virus infections, gastroesophageal reflux disease who presented to [facility] emergency department via EMS status post mechanical fall.  Pt s/p Right intertrochanteric IM nail on 10/22/21.  Clinical Impression  Patient is s/p above surgery resulting in functional limitations due to the deficits listed below (see PT Problem List).  Patient will benefit from skilled PT to increase their independence and safety with mobility to allow discharge to the venue listed below.  Pt assisted with transfer OOB to recliner.  Pt felt unable to ambulate yet however able to transfer to the recliner with min assist.  Pt anticipates d/c home with spouse who can provide a little assist.        Recommendations for follow up therapy are one component of a multi-disciplinary discharge planning process, led by the attending physician.  Recommendations may be updated based on patient status, additional functional criteria and insurance authorization.  Follow Up Recommendations Home health PT      Assistance Recommended at Discharge Set up Supervision/Assistance  Patient can return home with the following  A little help with walking and/or transfers;A little help with bathing/dressing/bathroom;Help with stairs or ramp for entrance;Assist for transportation;Assistance with cooking/housework    Equipment Recommendations None recommended by PT  Recommendations for Other Services       Functional Status Assessment Patient has had a recent decline in their functional status and demonstrates the ability to make significant improvements  in function in a reasonable and predictable amount of time.     Precautions / Restrictions Precautions Precautions: Fall Restrictions Weight Bearing Restrictions: No RLE Weight Bearing: Weight bearing as tolerated      Mobility  Bed Mobility Overal bed mobility: Needs Assistance Bed Mobility: Supine to Sit     Supine to sit: Min assist, HOB elevated     General bed mobility comments: assist for R LE, pt utilized bed rail    Transfers Overall transfer level: Needs assistance Equipment used: Rolling walker (2 wheels) Transfers: Sit to/from Stand, Bed to chair/wheelchair/BSC Sit to Stand: Min assist   Step pivot transfers: Min assist       General transfer comment: assist to rise and steady, cues for hand placement; pt felt unable to ambulate so performed a few steps over to recliner with step by step cues, assist for stabilizing (pt with pain)    Ambulation/Gait                  Stairs            Wheelchair Mobility    Modified Rankin (Stroke Patients Only)       Balance                                             Pertinent Vitals/Pain Pain Assessment Pain Assessment: 0-10 Pain Score: 5  Pain Location: right thigh Pain Descriptors / Indicators: Shooting, Grimacing, Sore Pain Intervention(s): Repositioned, Monitored during session, Premedicated before session    Home Living Family/patient expects to be discharged to:: Private residence Living Arrangements: Spouse/significant other   Type  of Home: House Home Access: Stairs to enter   [employer] of Steps: 1   Home Layout: Able to live on main level with bedroom/bathroom Home Equipment: Conservation officer, nature (2 wheels) Additional Comments: in process of moving, plans to stay in new house as above    Prior Function Prior Level of Function : Independent/Modified Independent                     Hand Dominance        Extremity/Trunk Assessment         Lower Extremity Assessment Lower Extremity Assessment: RLE deficits/detail RLE Deficits / Details: requiring assist due to pain, able to perform ankle pumps    Cervical / Trunk Assessment Cervical / Trunk Assessment: Kyphotic (significant kyphosis (from MM per pt))  Communication   Communication: No difficulties  Cognition Arousal/Alertness: Awake/alert Behavior During Therapy: WFL for tasks assessed/performed Overall Cognitive Status: Within Functional Limits for tasks assessed                                          General Comments      Exercises     Assessment/Plan    PT Assessment Patient needs continued PT services  PT Problem List Decreased strength;Decreased balance;Decreased mobility;Decreased range of motion;Decreased activity tolerance;Pain;Decreased knowledge of use of DME       PT Treatment Interventions DME instruction;Gait training;Stair training;Functional mobility training;Therapeutic activities;Patient/family education;Therapeutic exercise;Balance training    PT Goals (Current goals can be found in the Care Plan section)  Acute Rehab PT Goals PT Goal Formulation: With patient Time For Goal Achievement: 11/06/21 Potential to Achieve Goals: Good    Frequency Min 5X/week     Co-evaluation               AM-PAC PT "6 Clicks" Mobility  Outcome Measure Help needed turning from your back to your side while in a flat bed without using bedrails?: A Little Help needed moving from lying on your back to sitting on the side of a flat bed without using bedrails?: A Little Help needed moving to and from a bed to a chair (including a wheelchair)?: A Little Help needed standing up from a chair using your arms (e.g., wheelchair or bedside chair)?: A Little Help needed to walk in hospital room?: A Lot Help needed climbing 3-5 steps with a railing? : A Lot 6 Click Score: 16    End of Session Equipment Utilized During Treatment: Gait  belt Activity Tolerance: Patient tolerated treatment well Patient left: in chair;with call bell/phone within reach;with family/visitor present Nurse Communication: Mobility status PT Visit Diagnosis: Difficulty in walking, not elsewhere classified (R26.2);Pain Pain - Right/Left: Right Pain - part of body: Leg    Time: 3846-6599 PT Time Calculation (min) (ACUTE ONLY): 23 min   Charges:   PT Evaluation $PT Eval Low Complexity: 1 Low         Kati PT, DPT Physical Therapist Acute Rehabilitation Services Preferred contact method: Secure Chat Weekend Pager Only: [phone] Office: [place] 10/23/2021, 1:13 PM

## 2021-10-23 NOTE — Progress Notes (Signed)
Subjective: 1 Day Post-Op s/p Procedure(s): INTRAMEDULLARY (IM) NAIL INTERTROCHANTERIC   Patient is alert, oriented. Reports pain is so far well controlled, but she has currently been using her home oxycodone ER rather than the hospital version, as well prn pain  meds as needed. Pain is mainly located at the lateral right thigh. She has not tried to get up yet. Denies chest pain, SOB, Calf pain. No nausea/vomiting. No other complaints.    Objective:  PE: VITALS:   Vitals:   10/22/21 2239 10/23/21 0150 10/23/21 0217 10/23/21 0533  BP: (!) 83/71 (!) 83/59 (!) 89/66 (!) 94/57  Pulse: (!) 108 (!) 105 89 81  Resp: '16 16  16  '$ Temp: 98.6 F (37 C) 97.6 F (36.4 C)  98.2 F (36.8 C)  TempSrc: Oral Oral  Oral  SpO2: 100% 100%  100%  Weight:      Height:       General: sitting up in bed, in no acute distress Resp: Normal respiratory effort MSK: dressing CDI, dorsiflexion and plantarflexion intact. Distal sensation intact.  2+ DP pulse  LABS  Results for orders placed or performed during the hospital encounter of 10/21/21 (from the past 24 hour(s))  Prepare RBC (crossmatch)     Status: None   Collection Time: 10/22/21  3:10 PM  Result Value Ref Range   Order Confirmation      ORDER PROCESSED BY BLOOD BANK Performed at [facility] [address] [place], [place] [address]   Type and screen [place]     Status: None (Preliminary result)   Collection Time: 10/22/21  3:10 PM  Result Value Ref Range   ABO/RH(D) O POS    Antibody Screen NEG    Sample Expiration 10/25/2021,2359    Unit Number [id number]    Blood Component Type RBC, LR IRR    Unit division 00    Status of Unit ALLOCATED    Transfusion Status OK TO TRANSFUSE    Crossmatch Result      Compatible Performed at [facility], [place] [address], [place], [place] [address]    Unit Number [id number]    Blood Component Type RBC, LR IRR    Unit  division 00    Status of Unit ALLOCATED    Transfusion Status OK TO TRANSFUSE    Crossmatch Result Compatible   Comprehensive metabolic panel     Status: Abnormal   Collection Time: 10/23/21  5:26 AM  Result Value Ref Range   Sodium 139 135 - 145 mmol/L   Potassium 3.9 3.5 - 5.1 mmol/L   Chloride 106 98 - 111 mmol/L   CO2 27 22 - 32 mmol/L   Glucose, Bld 148 (H) 70 - 99 mg/dL   BUN 11 8 - 23 mg/dL   Creatinine, Ser 0.72 0.44 - 1.00 mg/dL   Calcium 7.8 (L) 8.9 - 10.3 mg/dL   Total Protein 4.5 (L) 6.5 - 8.1 g/dL   Albumin 3.0 (L) 3.5 - 5.0 g/dL   AST 24 15 - 41 U/L   ALT 15 0 - 44 U/L   Alkaline Phosphatase 32 (L) 38 - 126 U/L   Total Bilirubin 0.3 0.3 - 1.2 mg/dL   GFR, Estimated >60 >60 mL/min   Anion gap 6 5 - 15  Magnesium     Status: None   Collection Time: 10/23/21  5:26 AM  Result Value Ref Range   Magnesium 1.8 1.7 - 2.4 mg/dL   *  Note: Due to a large number of results and/or encounters for the requested time period, some results have not been displayed. A complete set of results can be found in Results Review.    DG Hip Port Unilat With Pelvis 1V Right  Result Date: 10/22/2021 CLINICAL DATA:  Status post surgical internal fixation of right hip fracture. EXAM: DG HIP (WITH OR WITHOUT PELVIS) 1V PORT RIGHT COMPARISON:  October 21, 2021. FINDINGS: Status post surgical internal fixation of proximal right femoral intertrochanteric fracture. Good alignment of fracture components is noted. Expected postoperative changes are noted in the surrounding soft tissues. IMPRESSION: Status post surgical internal fixation of proximal right femoral intertrochanteric fracture. Electronically Signed   By: Marijo Conception M.D.   On: 10/22/2021 17:32   DG C-Arm 1-60 Min-No Report  Result Date: 10/22/2021 CLINICAL DATA:  Intramedullary rod fixation of right femur. EXAM: RIGHT FEMUR 2 VIEWS; DG C-ARM 1-60 MIN-NO REPORT Radiation exposure index: 8.2057 mGy. COMPARISON:  October 21, 2021. FINDINGS:  Five intraoperative fluoroscopic images were obtained the right hip. These images demonstrate surgical internal fixation of intertrochanteric fracture involving proximal right femur. IMPRESSION: Fluoroscopic guidance provided during surgical internal fixation of proximal right femoral intertrochanteric fracture. Electronically Signed   By: Marijo Conception M.D.   On: 10/22/2021 17:17   DG FEMUR, MIN 2 VIEWS RIGHT  Result Date: 10/22/2021 CLINICAL DATA:  Intramedullary rod fixation of right femur. EXAM: RIGHT FEMUR 2 VIEWS; DG C-ARM 1-60 MIN-NO REPORT Radiation exposure index: 8.2057 mGy. COMPARISON:  October 21, 2021. FINDINGS: Five intraoperative fluoroscopic images were obtained the right hip. These images demonstrate surgical internal fixation of intertrochanteric fracture involving proximal right femur. IMPRESSION: Fluoroscopic guidance provided during surgical internal fixation of proximal right femoral intertrochanteric fracture. Electronically Signed   By: Marijo Conception M.D.   On: 10/22/2021 17:17   DG Femur Min 2 Views Right  Result Date: 10/21/2021 CLINICAL DATA:  Right hip pain after fall EXAM: RIGHT FEMUR 2 VIEWS; PELVIS - 1-2 VIEW COMPARISON:  None Available.  CT abdomen and pelvis 08/31/2014 FINDINGS: Acute mildly displaced intertrochanteric fracture of the right femur with medial displacement of the lesser trochanter. No hip dislocation. No additional fractures. Degenerative changes pubic symphysis, both hips, SI joints and lower lumbar spine. IMPRESSION: Right intertrochanteric femur fracture. Electronically Signed   By: Placido Sou M.D.   On: 10/21/2021 21:40   DG Pelvis 1-2 Views  Result Date: 10/21/2021 CLINICAL DATA:  Right hip pain after fall EXAM: RIGHT FEMUR 2 VIEWS; PELVIS - 1-2 VIEW COMPARISON:  None Available.  CT abdomen and pelvis 08/31/2014 FINDINGS: Acute mildly displaced intertrochanteric fracture of the right femur with medial displacement of the lesser trochanter. No  hip dislocation. No additional fractures. Degenerative changes pubic symphysis, both hips, SI joints and lower lumbar spine. IMPRESSION: Right intertrochanteric femur fracture. Electronically Signed   By: Placido Sou M.D.   On: 10/21/2021 21:40    Assessment/Plan: Closed intertrochanteric hip fracture  1 Day Post-Op s/p Procedure(s): INTRAMEDULLARY (IM) NAIL INTERTROCHANTERIC  Weightbearing: WBAT RLE Insicional and dressing care: Reinforce dressings as needed VTE prophylaxis: CBC still pending, will start lovenox '40mg'$  q 24 hours once CBC results come back Pain control: continue current regimen Follow - up plan: 2 weeks with Dr. Mardelle Matte Dispo: pending PT eval   Contact information:   Merlene Pulling, PA-C Weekdays 8-5  After hours and holidays please check [email or web address] for group call information for [facility]  Ventura Bruns 10/23/2021, 8:34 AM

## 2021-10-24 DIAGNOSIS — S72001A Fracture of unspecified part of neck of right femur, initial encounter for closed fracture: Secondary | ICD-10-CM | POA: Diagnosis not present

## 2021-10-24 LAB — CBC
HCT: 23 % — ABNORMAL LOW (ref 36.0–46.0)
Hemoglobin: 7.8 g/dL — ABNORMAL LOW (ref 12.0–15.0)
MCH: 37.5 pg — ABNORMAL HIGH (ref 26.0–34.0)
MCHC: 33.9 g/dL (ref 30.0–36.0)
MCV: 110.6 fL — ABNORMAL HIGH (ref 80.0–100.0)
Platelets: 85 10*3/uL — ABNORMAL LOW (ref 150–400)
RBC: 2.08 MIL/uL — ABNORMAL LOW (ref 3.87–5.11)
RDW: 12.3 % (ref 11.5–15.5)
WBC: 4.1 10*3/uL (ref 4.0–10.5)
nRBC: 0 % (ref 0.0–0.2)

## 2021-10-24 LAB — RETICULOCYTES
Immature Retic Fract: 21.4 % — ABNORMAL HIGH (ref 2.3–15.9)
RBC.: 2.22 MIL/uL — ABNORMAL LOW (ref 3.87–5.11)
Retic Count, Absolute: 55.1 10*3/uL (ref 19.0–186.0)
Retic Ct Pct: 2.5 % (ref 0.4–3.1)

## 2021-10-24 LAB — FERRITIN: Ferritin: 92 ng/mL (ref 11–307)

## 2021-10-24 LAB — IRON AND TIBC
Iron: 48 ug/dL (ref 28–170)
Saturation Ratios: 19 % (ref 10.4–31.8)
TIBC: 252 ug/dL (ref 250–450)
UIBC: 204 ug/dL

## 2021-10-24 LAB — VITAMIN B12: Vitamin B-12: 396 pg/mL (ref 180–914)

## 2021-10-24 LAB — FOLATE: Folate: 17 ng/mL (ref >5.9)

## 2021-10-24 MED ORDER — ENOXAPARIN SODIUM 40 MG/0.4ML IJ SOSY
40.0000 mg | PREFILLED_SYRINGE | INTRAMUSCULAR | Status: DC
Start: 2021-10-25 — End: 2021-10-25
  Administered 2021-10-25: 40 mg via SUBCUTANEOUS
  Filled 2021-10-24: qty 0.4

## 2021-10-24 NOTE — Progress Notes (Signed)
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 Alvy Bimler as well as [facility] myeloma clinic status post CAR-T in 2021 at [facility], recurrent varicella-zoster virus infections, gastroesophageal reflux disease who presented to [facility] long hospital emergency department via EMS status post mechanical fall.  Pt s/p Right intertrochanteric IM nail on 10/22/21.    PT Comments    Patient making good progress with mobility and demonstrated good carryover for safe transfer technique using RW. Pt ambulated ~180' with RW. Initiated stair/step training today with RW and no rails. Pt completed forward step pattern with min assist to stabilize walker. Pt will benefit from HHPT follow up and will need youth RW and [facility] for safe mobility at home. Will continue to progress as able during stay.    Recommendations for follow up therapy are one component of a multi-disciplinary discharge planning process, led by the attending physician.  Recommendations may be updated based on patient status, additional functional criteria and insurance authorization.  Follow Up Recommendations  Home health PT     Assistance Recommended at Discharge Intermittent Supervision/Assistance  Patient can return home with the following A little help with walking and/or transfers;A little help with bathing/dressing/bathroom;Help with stairs or ramp for entrance;Assist for transportation;Assistance with cooking/housework   Equipment Recommendations  Rolling walker (2 wheels);BSC/3in1 (youth RW)    Recommendations for Other Services       Precautions / Restrictions Precautions Precautions: Fall Restrictions Weight Bearing Restrictions: No RLE Weight Bearing: Weight bearing as tolerated     Mobility  Bed  Mobility               General bed mobility comments: pt OOB in recliner    Transfers Overall transfer level: Needs assistance Equipment used: Rolling walker (2 wheels) Transfers: Sit to/from Stand Sit to Stand: Min guard           General transfer comment: guarding for safety, pt using bil UE for power up from recliner and safe reach back to control sit.    Ambulation/Gait Ambulation/Gait assistance: Min guard Gait Distance (Feet): 180 Feet Assistive device: Rolling walker (2 wheels) Gait Pattern/deviations: Step-through pattern, Decreased stride length Gait velocity: fair     General Gait Details: cues for safe walker position, pt maintained well throughout. no LOB noted, pt with flexed posture due to significant kyphosis but no ant/poster lean noted.   Stairs Stairs: Yes Stairs assistance: Min assist Stair Management: No rails, Step to pattern, Forwards, With walker Number of Stairs: 1 (2x1) General stair comments: cues for safe sequencing "up with good, down with bad" and for walker positioning. Min assist to steady walker for single step negotiation.   Wheelchair Mobility    Modified Rankin (Stroke Patients Only)       Balance Overall balance assessment: Needs assistance Sitting-balance support: Feet supported Sitting balance-Leahy Scale: Good     Standing balance support: During functional activity, Reliant on assistive device for balance, Bilateral upper extremity supported Standing balance-Leahy Scale: Fair                              Cognition Arousal/Alertness: Awake/alert Behavior During Therapy: WFL for tasks assessed/performed Overall Cognitive Status: Within Functional Limits for tasks assessed  Exercises      General Comments        Pertinent Vitals/Pain Pain Assessment Pain Assessment: Faces Faces Pain Scale: Hurts a little bit Pain Location: right  thigh Pain Descriptors / Indicators: Sore Pain Intervention(s): Limited activity within patient's tolerance, Monitored during session, Repositioned    Home Living                          Prior Function            PT Goals (current goals can now be found in the care plan section) Acute Rehab PT Goals Patient Stated Goal: recover and get home PT Goal Formulation: With patient Time For Goal Achievement: 11/06/21 Potential to Achieve Goals: Good Progress towards PT goals: Progressing toward goals    Frequency    Min 5X/week      PT Plan Current plan remains appropriate    Co-evaluation              AM-PAC PT "6 Clicks" Mobility   Outcome Measure  Help needed turning from your back to your side while in a flat bed without using bedrails?: A Little Help needed moving from lying on your back to sitting on the side of a flat bed without using bedrails?: A Little Help needed moving to and from a bed to a chair (including a wheelchair)?: A Little Help needed standing up from a chair using your arms (e.g., wheelchair or bedside chair)?: A Little Help needed to walk in hospital room?: A Little Help needed climbing 3-5 steps with a railing? : A Little 6 Click Score: 18    End of Session Equipment Utilized During Treatment: Gait belt Activity Tolerance: Patient tolerated treatment well Patient left: in chair;with call bell/phone within reach;with family/visitor present Nurse Communication: Mobility status PT Visit Diagnosis: Difficulty in walking, not elsewhere classified (R26.2);Pain Pain - Right/Left: Right Pain - part of body: Leg     Time: 0539-7673 PT Time Calculation (min) (ACUTE ONLY): 20 min  Charges:  $Gait Training: 8-22 mins                     Verner Mould, DPT Acute Rehabilitation Services Office [phone] Pager [phone]  10/24/21 11:53 AM

## 2021-10-24 NOTE — Progress Notes (Signed)
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 Alvy Bimler as well as [facility] myeloma clinic status post CAR-T in 2021 at [facility], recurrent varicella-zoster virus infections, gastroesophageal reflux disease who presented to [facility] long hospital emergency department via EMS status post mechanical fall.  Pt s/p Right intertrochanteric IM nail on 10/22/21.   OT comments  Therapist instructed on use of LHR to assist with LB dressing but patient prefers to have husband assist with lifting her leg. Patient demonstrated ability to perform dressing -  needing assistance to don sock and raise right leg to get clothing over it. She is independent with toileting. Recommended BSC over toilet at home and to use in shower if needed. No further OT needs.   Recommendations for follow up therapy are one component of a multi-disciplinary discharge planning process, led by the attending physician.  Recommendations may be updated based on patient status, additional functional criteria and insurance authorization.    Follow Up Recommendations  No OT follow up    Assistance Recommended at Discharge Intermittent Supervision/Assistance  Patient can return home with the following  A little help with bathing/dressing/bathroom;Assistance with cooking/housework;Help with stairs or ramp for entrance   Equipment Recommendations  BSC/3in1    Recommendations for Other Services      Precautions / Restrictions Precautions Precautions: Fall Restrictions Weight Bearing Restrictions: No RLE Weight Bearing: Weight bearing as tolerated       Mobility Bed Mobility                    Transfers                         Balance                                            ADL either performed or assessed with clinical judgement   ADL Overall ADL's : Needs assistance/impaired                 Upper Body Dressing : Independent   Lower Body Dressing: Minimal assistance Lower Body Dressing Details (indicate cue type and reason): had assitance to raise right leg to don underwear and pants but otherwise able to get clothing on. Had assistance for socks as well. Discussed not wearing flip flops and using a reacher to compensate. Toilet Transfer: Dentist and Hygiene: Independent       Functional mobility during ADLs: Supervision/safety;Rolling walker (2 wheels) General ADL Comments: Verbal instruction to not get too far into walker with ambulation.    Extremity/Trunk Assessment Upper Extremity Assessment Upper Extremity Assessment: Overall WFL for tasks assessed            Vision Patient Visual Report: No change from baseline     Perception     Praxis      Cognition Arousal/Alertness: Awake/alert Behavior During Therapy: WFL for tasks assessed/performed Overall Cognitive Status: Within Functional Limits for tasks assessed  Exercises      Shoulder Instructions       General Comments      Pertinent Vitals/ Pain       Pain Assessment Pain Assessment: Faces Faces Pain Scale: Hurts a little bit Pain Location: right thigh Pain Descriptors / Indicators: Sore Pain Intervention(s): Monitored during session  Home Living                                          Prior Functioning/Environment              Frequency           Progress Toward Goals  OT Goals(current goals can now be found in the care plan section)  Progress towards OT goals: Goals met/education completed, patient discharged from [place] All goals met and education completed, patient discharged from OT services     Co-evaluation                 AM-PAC OT "6 Clicks" Daily Activity     Outcome Measure   Help from another person eating meals?: None Help from another person taking care of personal grooming?: None Help from another person toileting, which includes using toliet, bedpan, or urinal?: None Help from another person bathing (including washing, rinsing, drying)?: A Little Help from another person to put on and taking off regular upper body clothing?: None Help from another person to put on and taking off regular lower body clothing?: A Little 6 Click Score: 22    End of Session Equipment Utilized During Treatment: Rolling walker (2 wheels)  OT Visit Diagnosis: Other abnormalities of gait and mobility (R26.89)   Activity Tolerance Patient tolerated treatment well   Patient Left in chair;with call bell/phone within reach;with family/visitor present   Nurse Communication Mobility status        Time: 5806-3868 OT Time Calculation (min): 17 min  Charges: OT General Charges $OT Visit: 1 Visit OT Treatments $Self Care/Home Management : 8-22 mins  Derl Barrow, OTR/L [place]  Office [phone] Pager: [phone]   Lenward Chancellor 10/24/2021, 1:41 PM

## 2021-10-24 NOTE — Progress Notes (Signed)
Subjective: 2 Days Post-Op s/p Procedure(s): INTRAMEDULLARY (IM) NAIL INTERTROCHANTERIC   Patient is alert, oriented, sitting up in the chair. Had some pain when getting up and walking but overall well controlled. Pain located at lateral right thigh, feels good about progress with therapy yesterday. Denies lightheadedness or dizziness. No other complaints.     Objective:  PE: VITALS:   Vitals:   10/23/21 1730 10/23/21 2154 10/24/21 0123 10/24/21 0517  BP: 96/60 1'13/70 95/67 94/67 '$  Pulse: (!) 108 (!) 125 97 93  Resp: '16 18 18 18  '$ Temp: 98.3 F (36.8 C) 98.9 F (37.2 C) 98.1 F (36.7 C) 97.7 F (36.5 C)  TempSrc:  Oral Oral Oral  SpO2: 97% 98% 98% 100%  Weight:      Height:       General: sitting up in bed, in no acute distress Resp: Normal respiratory effort MSK: dressing CDI, no ecchymosis, dorsiflexion and plantarflexion intact. Distal sensation intact.  2+ DP pulse  LABS  Results for orders placed or performed during the hospital encounter of 10/21/21 (from the past 24 hour(s))  CBC with Differential/Platelet     Status: Abnormal   Collection Time: 10/23/21  9:52 AM  Result Value Ref Range   WBC 5.6 4.0 - 10.5 K/uL   RBC 2.30 (L) 3.87 - 5.11 MIL/uL   Hemoglobin 8.4 (L) 12.0 - 15.0 g/dL   HCT 25.0 (L) 36.0 - 46.0 %   MCV 108.7 (H) 80.0 - 100.0 fL   MCH 36.5 (H) 26.0 - 34.0 pg   MCHC 33.6 30.0 - 36.0 g/dL   RDW 12.0 11.5 - 15.5 %   Platelets 98 (L) 150 - 400 K/uL   nRBC 0.0 0.0 - 0.2 %   Neutrophils Relative % 77 %   Neutro Abs 4.4 1.7 - 7.7 K/uL   Lymphocytes Relative 8 %   Lymphs Abs 0.4 (L) 0.7 - 4.0 K/uL   Monocytes Relative 13 %   Monocytes Absolute 0.7 0.1 - 1.0 K/uL   Eosinophils Relative 1 %   Eosinophils Absolute 0.0 0.0 - 0.5 K/uL   Basophils Relative 0 %   Basophils Absolute 0.0 0.0 - 0.1 K/uL   Immature Granulocytes 1 %   Abs Immature Granulocytes 0.03 0.00 - 0.07 K/uL  CBC     Status: Abnormal   Collection Time: 10/24/21  4:54 AM  Result  Value Ref Range   WBC 4.1 4.0 - 10.5 K/uL   RBC 2.08 (L) 3.87 - 5.11 MIL/uL   Hemoglobin 7.8 (L) 12.0 - 15.0 g/dL   HCT 23.0 (L) 36.0 - 46.0 %   MCV 110.6 (H) 80.0 - 100.0 fL   MCH 37.5 (H) 26.0 - 34.0 pg   MCHC 33.9 30.0 - 36.0 g/dL   RDW 12.3 11.5 - 15.5 %   Platelets 85 (L) 150 - 400 K/uL   nRBC 0.0 0.0 - 0.2 %   *Note: Due to a large number of results and/or encounters for the requested time period, some results have not been displayed. A complete set of results can be found in Results Review.    DG Hip Port Unilat With Pelvis 1V Right  Result Date: 10/22/2021 CLINICAL DATA:  Status post surgical internal fixation of right hip fracture. EXAM: DG HIP (WITH OR WITHOUT PELVIS) 1V PORT RIGHT COMPARISON:  October 21, 2021. FINDINGS: Status post surgical internal fixation of proximal right femoral intertrochanteric fracture. Good alignment of fracture components is noted. Expected postoperative changes are noted  in the surrounding soft tissues. IMPRESSION: Status post surgical internal fixation of proximal right femoral intertrochanteric fracture. Electronically Signed   By: Marijo Conception M.D.   On: 10/22/2021 17:32   DG C-Arm 1-60 Min-No Report  Result Date: 10/22/2021 CLINICAL DATA:  Intramedullary rod fixation of right femur. EXAM: RIGHT FEMUR 2 VIEWS; DG C-ARM 1-60 MIN-NO REPORT Radiation exposure index: 8.2057 mGy. COMPARISON:  October 21, 2021. FINDINGS: Five intraoperative fluoroscopic images were obtained the right hip. These images demonstrate surgical internal fixation of intertrochanteric fracture involving proximal right femur. IMPRESSION: Fluoroscopic guidance provided during surgical internal fixation of proximal right femoral intertrochanteric fracture. Electronically Signed   By: Marijo Conception M.D.   On: 10/22/2021 17:17   DG FEMUR, MIN 2 VIEWS RIGHT  Result Date: 10/22/2021 CLINICAL DATA:  Intramedullary rod fixation of right femur. EXAM: RIGHT FEMUR 2 VIEWS; DG C-ARM 1-60  MIN-NO REPORT Radiation exposure index: 8.2057 mGy. COMPARISON:  October 21, 2021. FINDINGS: Five intraoperative fluoroscopic images were obtained the right hip. These images demonstrate surgical internal fixation of intertrochanteric fracture involving proximal right femur. IMPRESSION: Fluoroscopic guidance provided during surgical internal fixation of proximal right femoral intertrochanteric fracture. Electronically Signed   By: Marijo Conception M.D.   On: 10/22/2021 17:17    Assessment/Plan: Closed intertrochanteric hip fracture  2 Days Post-Op s/p Procedure(s): INTRAMEDULLARY (IM) NAIL INTERTROCHANTERIC  Weightbearing: WBAT RLE Insicional and dressing care: Reinforce dressings as needed VTE prophylaxis: Hbg trending down to 7.8 this morning, will hold lovenox today, will likely restart tomorrow if Hbg trending up. No signs of hematoma formation.  Pain control: continue current regimen Follow - up plan: 2 weeks with Dr. Mardelle Matte PT recommending discharge home with HHPT as patient has adequate support at home. TOC working on HHPT, have reached out to multiple agencies.  Dispo: possibly tomorrow, pending HHPT if able to find agency.  Contact information:   Merlene Pulling, Hershal Coria [id number] 8-5  After hours and holidays please check [email or web address] for group call information for [facility]  Ventura Bruns 10/24/2021, 7:27 AM

## 2021-10-24 NOTE — TOC Progression Note (Signed)
Transition of Care [facility]) - Progression Note   Patient Details  Name: DENYA BUCKINGHAM MRN: [id number] Date of Birth: 1960-09-19  Transition of Care [facility]) CM/SW [employer], LCSW Phone Number: 10/24/2021, 3:06 PM  Clinical Narrative: PT and OT evaluations recommended a youth rolling walker and 3N1. Patient agreeable to DME referral to [facility]. CSW made DME referral to [facility] with [employer]. [employer] to deliver youth rolling walker and 3N1 to room. HHPT order is in and Cindie with [facility] was updated.  Expected Discharge Plan: [place] Barriers to Discharge: Continued Medical Work up  Expected Discharge Plan and Services Expected Discharge Plan: [place] In-house Referral: Clinical Social Work Post Acute Care Choice: Home Health, Durable Medical Equipment Living arrangements for the past 2 months: [place]             DME Arranged: 3-N-1, Environmental consultant youth DME Agency: [employer] Date DME Agency Contacted: 10/24/21 Time DME Agency Contacted: 38 Representative spoke with at DME Agency: Brenton Grills HH Arranged: PT [place]: [place] Date [place]: 10/23/21 Time [place]: [place] Representative spoke with at [place]: Cindie  Readmission Risk Interventions     No data to display

## 2021-10-24 NOTE — Progress Notes (Signed)
PROGRESS NOTE    Jessica Cochran  ZSM:[id number] DOB: 1960-04-25 DOA: 10/21/2021 PCP: System, Provider Not In    Brief Narrative:   Jessica Cochran is a 61 y.o. female with past medical history significant for kappa light chain/multiple myeloma, chronic pain syndrome, history of herpes zoster, thrombocytopenia, GERD who presented to [facility] ED on 8/14 via EMS with complaints of right hip pain following mechanical fall.  Patient was shopping and fell and landed on her right side complaining about immediate pain.  Patient denies hitting her head or loss of consciousness.    In the ED, sodium 141, potassium 4.2, chloride 107, bicarb 27, BUN 15, creatinine 0.85.  WBC 7.9, hemoglobin 12.3, platelet count 106.  Pelvic x-ray revealing right intertrochanteric femur fracture.  Orthopedics was consulted.  TRH consulted for further evaluation management of hip fracture.  Assessment & Plan:   Closed right hip fracture, initial encounter Patient presenting to ED following mechanical fall.  Pelvis x-ray notable for right intertrochanteric femur fracture.  Orthopedics was consulted and patient underwent ORIF with IMN by Dr. Mardelle Matte on 10/22/2021.  Vitamin D 25-hydroxy 30.03. --Orthopedics following, appreciate assistance --WBAT RLE --Lovenox postoperative DVT prophylaxis per orthopedics x4 weeks (holding today per ortho for continued downtrend in hemoglobin) --Continue oxycodone 30 mg PO q12h (on Xtampza 28m q12h at home) --oxycodone IR 5-189mPO q4h PRN moderate pain --morpine 0.5-44m444mV q2h PRN severe breakthrough pain --Baxan 5 mg p.o. every 8 hours.  Muscle spasms -- PT recommending home health, no needs identified per OT; home health orders placed  Postoperative blood loss anemia Macrocytic anemia Hemoglobin 12.3 on admission, down to 8.4 postoperatively.  Received tranexamic acid perioperatively --Hgb 12.3>8.4>7.8 --Check anemia panel --Continue ferrous sulfate 325 mg p.o. TID --Transfuse  for hemoglobin less than 7.0 --cbc daily  Hx multiple myeloma --Outpatient follow-up with oncology  Chronic pain syndrome On Xtampza outpatient.  History of recurrent herpes zoster --Continue Valtrex 1000 mg p.o. daily  GERD: Continue Protonix 40 mg p.o. daily   DVT prophylaxis: enoxaparin (LOVENOX) injection 40 mg Start: 10/25/21 1000 SCDs Start: 10/22/21 1748 SCDs Start: 10/22/21 0716    Code Status: Full Code Family Communication: Updated husband present at bedside this morning  Disposition Plan:  Level of care: Med-Surg Status is: Inpatient Remains inpatient appropriate because: Anticipate discharge home with home health tomorrow; need to continue to trend hemoglobin     Consultants:  Orthopedics, Dr. LanMardelle Matterocedures:  ORIF, IMN right femur, Dr. LanMardelle Matte15  Antimicrobials:  Perioperative cefazolin   Subjective: Patient seen examined bedside, resting comfortably.  No complaints currently.  Sitting at edge of bed with husband present.  Was able to ambulate some with use of walker in the room without much issue today.  Awaiting for OT to return to assist with and ensure that she can clothe herself.  Discussed anticipated discharge home likely tomorrow with home health. No questions or concerns.  Denies headache, no dizziness, no chest pain, no palpitations, no shortness of breath, no abdominal pain, no fever/chills/night sweats, no nausea/vomiting/diarrhea, no focal weakness, no fatigue, no paresthesias.  No acute events overnight per nursing staff  Objective: Vitals:   10/23/21 1730 10/23/21 2154 10/24/21 0123 10/24/21 0517  BP: 96/60 1_0  Pulse: (!) 108 (!) 125 97 93  Resp: _1 Temp: 98.3 F (36.8 C) 98.9 F (37.2 C) 98.1 F (36.7 C) 97.7 F (36.5 C)  TempSrc:  Oral Oral Oral  SpO2: 97% 98% 98%  100%  Weight:      Height:        Intake/Output Summary (Last 24 hours) at 10/24/2021 1156 Last data filed at 10/24/2021 1000 Gross per  24 hour  Intake 940 ml  Output 1100 ml  Net -160 ml   Filed Weights   10/21/21 2058 10/22/21 1446  Weight: 49.9 kg 52.2 kg    Examination:  Physical Exam: GEN: NAD, alert and oriented x 3, wd/wn HEENT: NCAT, PERRL, EOMI, sclera clear, MMM PULM: CTAB w/o wheezes/crackles, normal respiratory effort, on room air CV: RRR w/o M/G/R GI: abd soft, NTND, NABS, no R/G/M MSK: no peripheral edema, muscle strength globally intact 5/5 bilateral upper/lower extremities, surgical dressings noted in place, no surrounding erythema/fluctuance or significant ecchymosis, dressings clean/dry/intact NEURO: CN II-XII intact, no focal deficits, sensation to light touch intact PSYCH: normal mood/affect Integumentary: Local dressings/operative site noted as above, otherwise no other concerning rashes/lesions/wounds     Data Reviewed: I have personally reviewed following labs and imaging studies  CBC: Recent Labs  Lab 10/21/21 2230 10/23/21 0952 10/24/21 0454  WBC 7.9 5.6 4.1  NEUTROABS 7.1 4.4  --   HGB 12.3 8.4* 7.8*  HCT 35.6* 25.0* 23.0*  MCV 106.6* 108.7* 110.6*  PLT 106* 98* 85*   Basic Metabolic Panel: Recent Labs  Lab 10/21/21 2230 10/23/21 0526  NA 141 139  K 4.2 3.9  CL 107 106  CO2 27 27  GLUCOSE 102* 148*  BUN 15 11  CREATININE 0.85 0.72  CALCIUM 8.6* 7.8*  MG  --  1.8   GFR: Estimated Creatinine Clearance: 51.3 mL/min (by C-G formula based on SCr of 0.72 mg/dL). Liver Function Tests: Recent Labs  Lab 10/21/21 2230 10/23/21 0526  AST 31 24  ALT 16 15  ALKPHOS 42 32*  BILITOT 0.5 0.3  PROT 5.8* 4.5*  ALBUMIN 4.1 3.0*   No results for input(s): "LIPASE", "AMYLASE" in the last 168 hours. No results for input(s): "AMMONIA" in the last 168 hours. Coagulation Profile: No results for input(s): "INR", "PROTIME" in the last 168 hours. Cardiac Enzymes: No results for input(s): "CKTOTAL", "CKMB", "CKMBINDEX", "TROPONINI" in the last 168 hours. BNP (last 3 results) No  results for input(s): "PROBNP" in the last 8760 hours. HbA1C: No results for input(s): "HGBA1C" in the last 72 hours. CBG: No results for input(s): "GLUCAP" in the last 168 hours. Lipid Profile: No results for input(s): "CHOL", "HDL", "LDLCALC", "TRIG", "CHOLHDL", "LDLDIRECT" in the last 72 hours. Thyroid Function Tests: No results for input(s): "TSH", "T4TOTAL", "FREET4", "T3FREE", "THYROIDAB" in the last 72 hours. Anemia Panel: No results for input(s): "VITAMINB12", "FOLATE", "FERRITIN", "TIBC", "IRON", "RETICCTPCT" in the last 72 hours. Sepsis Labs: No results for input(s): "PROCALCITON", "LATICACIDVEN" in the last 168 hours.  Recent Results (from the past 240 hour(s))  MRSA Next Gen by PCR, Nasal     Status: None   Collection Time: 10/22/21  4:04 AM   Specimen: Nasal Mucosa; Nasal Swab  Result Value Ref Range Status   MRSA by PCR Next Gen NOT DETECTED NOT DETECTED Final    Comment: (NOTE) The GeneXpert MRSA Assay (FDA approved for NASAL specimens only), is one component of a comprehensive MRSA colonization surveillance program. It is not intended to diagnose MRSA infection nor to guide or monitor treatment for MRSA infections. Test performance is not FDA approved in patients less than 32 years old. Performed at [facility], [place] [address], [place],  [address]  Radiology Studies: DG Hip Port Unilat With Pelvis 1V Right  Result Date: 10/22/2021 CLINICAL DATA:  Status post surgical internal fixation of right hip fracture. EXAM: DG HIP (WITH OR WITHOUT PELVIS) 1V PORT RIGHT COMPARISON:  October 21, 2021. FINDINGS: Status post surgical internal fixation of proximal right femoral intertrochanteric fracture. Good alignment of fracture components is noted. Expected postoperative changes are noted in the surrounding soft tissues. IMPRESSION: Status post surgical internal fixation of proximal right femoral intertrochanteric fracture. Electronically  Signed   By: Marijo Conception M.D.   On: 10/22/2021 17:32   DG C-Arm 1-60 Min-No Report  Result Date: 10/22/2021 CLINICAL DATA:  Intramedullary rod fixation of right femur. EXAM: RIGHT FEMUR 2 VIEWS; DG C-ARM 1-60 MIN-NO REPORT Radiation exposure index: 8.2057 mGy. COMPARISON:  October 21, 2021. FINDINGS: Five intraoperative fluoroscopic images were obtained the right hip. These images demonstrate surgical internal fixation of intertrochanteric fracture involving proximal right femur. IMPRESSION: Fluoroscopic guidance provided during surgical internal fixation of proximal right femoral intertrochanteric fracture. Electronically Signed   By: Marijo Conception M.D.   On: 10/22/2021 17:17   DG FEMUR, MIN 2 VIEWS RIGHT  Result Date: 10/22/2021 CLINICAL DATA:  Intramedullary rod fixation of right femur. EXAM: RIGHT FEMUR 2 VIEWS; DG C-ARM 1-60 MIN-NO REPORT Radiation exposure index: 8.2057 mGy. COMPARISON:  October 21, 2021. FINDINGS: Five intraoperative fluoroscopic images were obtained the right hip. These images demonstrate surgical internal fixation of intertrochanteric fracture involving proximal right femur. IMPRESSION: Fluoroscopic guidance provided during surgical internal fixation of proximal right femoral intertrochanteric fracture. Electronically Signed   By: Marijo Conception M.D.   On: 10/22/2021 17:17        Scheduled Meds:  acetaminophen  1,000 mg Oral TID   cholecalciferol  1,000 Units Oral Daily   docusate sodium  100 mg Oral BID   [START ON 10/25/2021] enoxaparin (LOVENOX) injection  40 mg Subcutaneous Q24H   feeding supplement  237 mL Oral BID BM   ferrous sulfate  325 mg Oral TID PC   oxyCODONE  30 mg Oral Q12H   pantoprazole  40 mg Oral Daily   senna  1 tablet Oral BID   sulfamethoxazole-trimethoprim  1 tablet Oral Once per day on Mon Wed Fri   valACYclovir  1,000 mg Oral Daily   Continuous Infusions:     LOS: 3 days    Time spent: 48 minutes spent on chart review,  discussion with nursing staff, consultants, updating family and interview/physical exam; more than 50% of that time was spent in counseling and/or coordination of care.    Zymir Napoli J [place] DO [facility] Available via Epic secure chat 7am-7pm After these hours, please refer to coverage provider listed on [email or web address] 10/24/2021, 11:56 AM

## 2021-10-25 ENCOUNTER — Encounter: Payer: Self-pay | Admitting: Hematology and Oncology

## 2021-10-25 DIAGNOSIS — S72001A Fracture of unspecified part of neck of right femur, initial encounter for closed fracture: Secondary | ICD-10-CM | POA: Diagnosis not present

## 2021-10-25 LAB — CBC
HCT: 23.4 % — ABNORMAL LOW (ref 36.0–46.0)
Hemoglobin: 7.7 g/dL — ABNORMAL LOW (ref 12.0–15.0)
MCH: 36.7 pg — ABNORMAL HIGH (ref 26.0–34.0)
MCHC: 32.9 g/dL (ref 30.0–36.0)
MCV: 111.4 fL — ABNORMAL HIGH (ref 80.0–100.0)
Platelets: 99 10*3/uL — ABNORMAL LOW (ref 150–400)
RBC: 2.1 MIL/uL — ABNORMAL LOW (ref 3.87–5.11)
RDW: 12.5 % (ref 11.5–15.5)
WBC: 3.2 10*3/uL — ABNORMAL LOW (ref 4.0–10.5)
nRBC: 0 % (ref 0.0–0.2)

## 2021-10-25 MED ORDER — ENOXAPARIN SODIUM 40 MG/0.4ML IJ SOSY: 40.0000 mg | PREFILLED_SYRINGE | INTRAMUSCULAR | 0 refills | Status: AC

## 2021-10-25 MED ORDER — FERROUS SULFATE 325 (65 FE) MG PO TABS: 325.0000 mg | ORAL_TABLET | Freq: Three times a day (TID) | ORAL | 0 refills | Status: AC

## 2021-10-25 MED ORDER — ENOXAPARIN (LOVENOX) PATIENT EDUCATION KIT
PACK | Freq: Once | Status: AC
  Filled 2021-10-25: qty 1

## 2021-10-25 MED ORDER — OXYCODONE HCL 5 MG PO TABS
5.0000 mg | ORAL_TABLET | ORAL | 0 refills | Status: DC | PRN
Start: 2021-10-25 — End: 2021-10-25

## 2021-10-25 MED ORDER — OXYCODONE HCL 10 MG PO TABS
5.0000 mg | ORAL_TABLET | ORAL | 0 refills | Status: AC | PRN
Start: 2021-10-25 — End: ?

## 2021-10-25 NOTE — Progress Notes (Signed)
Patient and spouse was given discharge instructions to include the Lovenox Education Kit, and all questions were answered. Patient was stable for discharge and was taken to the main exit by wheelchair.

## 2021-10-25 NOTE — TOC Transition Note (Signed)
Transition of Care [facility]) - CM/SW Discharge Note  Patient Details  Name: [name] MRN: [id number] Date of Birth: 1960/09/05  Transition of Care [facility]) CM/SW Contact:  Sherie Don, LCSW Phone Number: 10/25/2021, 10:20 AM  Clinical Narrative: CSW notified [facility] of patient's discharge. TOC signing off.  Final next level of care: [place] Barriers to Discharge: Barriers Resolved  Patient Goals and CMS Choice Patient states their goals for this hospitalization and ongoing recovery are:: Return home CMS Medicare.gov Compare Post Acute Care list provided to:: Patient Choice offered to / list presented to : Patient  Discharge Plan and Services In-house Referral: Clinical Social Work Post Acute Care Choice: Home Health, Durable Medical Equipment          DME Arranged: 3-N-1, Environmental consultant youth DME Agency: [employer] Date DME Agency Contacted: 10/24/21 Time DME Agency Contacted: [id number] Representative spoke with at DME Agency: Brenton Grills HH Arranged: PT [place]: [place] Date [place]: 10/23/21 Time [place]: [place] Representative spoke with at [place]: Cindie  Readmission Risk Interventions     No data to display

## 2021-10-25 NOTE — Discharge Summary (Signed)
Physician Discharge Summary  RICKAYLA WIELAND ZOX:[id number] DOB: [birth year] DOA: 10/21/2021  PCP: System, Provider Not In  Admit date: 10/21/2021 Discharge date: 10/25/2021  Admitted From: Home Disposition: Home  Recommendations for Outpatient Follow-up:  Follow up with PCP in 1-2 weeks Follow-up with orthopedics, Dr. Mardelle Matte 2 weeks Please obtain CBC in one week to assess hemoglobin level  Home Health: PT Equipment/Devices: 3 :1 bedside commode, rolling walker  Discharge Condition: Stable CODE STATUS: Full code Diet recommendation: Regular diet  History of present illness:  Jessica Cochran is a 61 y.o. female with past medical history significant for kappa light chain/multiple myeloma, chronic pain syndrome, history of herpes zoster, thrombocytopenia, GERD who presented to [facility] ED on 8/14 via EMS with complaints of right hip pain following mechanical fall.  Patient was shopping and fell and landed on her right side complaining about immediate pain.  Patient denies hitting her head or loss of consciousness.     In the ED, sodium 141, potassium 4.2, chloride 107, bicarb 27, BUN 15, creatinine 0.85.  WBC 7.9, hemoglobin 12.3, platelet count 106.  Pelvic x-ray revealing right intertrochanteric femur fracture.  Orthopedics was consulted.  TRH consulted for further evaluation management of hip fracture.  Hospital course:  Closed right hip fracture, initial encounter Patient presenting to ED following mechanical fall.  Pelvis x-ray notable for right intertrochanteric femur fracture.  Orthopedics was consulted and patient underwent ORIF with IMN by Dr. Mardelle Matte on 10/22/2021.  Vitamin D 25-hydroxy 30.03.  Continue home D[place] and oxycodone as needed for breakthrough pain.  Discharging home with home health PT.  Continue Lovenox for postoperative DVT prophylaxis per orthopedics x4 weeks.  Outpatient follow-up with Dr. Mardelle Matte 2 weeks.   Postoperative blood loss anemia Macrocytic  anemia Hemoglobin 12.3 on admission, down to 8.4 postoperatively.  Received tranexamic acid perioperatively.  Patient's hemoglobin trended down to 7.7 and remained stable otherwise.  Continue ferrous sulfate 325 mg p.o. 3 times daily.  Discharging on Lovenox as above.  Recommend repeat CBC 1 week to assess hemoglobin level.   Hx multiple myeloma Outpatient follow-up with oncology   Chronic pain syndrome On Xtampza outpatient.   History of recurrent herpes zoster Continue Valtrex 1000 mg p.o. daily   GERD: Continue Protonix 40 mg p.o. daily    Discharge Diagnoses:  Active Problems:   Multiple myeloma in remission ([place])   History of herpes zoster   Thrombocytopenia (HCC)   History of recurrent UTI (urinary tract infection)   Chronic pain syndrome   S/P right hip fracture    Discharge Instructions  Discharge Instructions     Call MD for:  difficulty breathing, headache or visual disturbances   Complete by: As directed    Call MD for:  extreme fatigue   Complete by: As directed    Call MD for:  persistant dizziness or light-headedness   Complete by: As directed    Call MD for:  persistant nausea and vomiting   Complete by: As directed    Call MD for:  redness, tenderness, or signs of infection (pain, swelling, redness, odor or green/yellow discharge around incision site)   Complete by: As directed    Call MD for:  severe uncontrolled pain   Complete by: As directed    Call MD for:  temperature >100.4   Complete by: As directed    Diet - low sodium heart healthy   Complete by: As directed    Increase activity slowly   Complete by: As directed  No wound care   Complete by: As directed       Allergies as of 10/25/2021       Reactions   Hydromorphone Nausea Only        Medication List     STOP taking these medications    clonazePAM 0.5 MG tablet Commonly known as: KlonoPIN       TAKE these medications    cholecalciferol 1000 units tablet Commonly  known as: VITAMIN D Take 1,000 Units by mouth daily.   enoxaparin 40 MG/0.4ML injection Commonly known as: LOVENOX Inject 0.4 mLs (40 mg total) into the skin daily for 28 days. Start taking on: October 26, 2021   ferrous sulfate 325 (65 FE) MG tablet Take 1 tablet (325 mg total) by mouth 3 (three) times daily after meals.   ONE-A-DAY 50 PLUS PO Take by mouth daily.   oxyCODONE 5 MG immediate release tablet Commonly known as: Oxy IR/ROXICODONE Take 1 tablet (5 mg total) by mouth every 4 (four) hours as needed for severe pain.   pantoprazole 40 MG tablet Commonly known as: PROTONIX TAKE (1) TABLET DAILY AS NEEDED. What changed:  how much to take how to take this when to take this additional instructions   sulfamethoxazole-trimethoprim 800-160 MG tablet Commonly known as: BACTRIM DS Take 1 tablet by mouth 3 (three) times a week.   valACYclovir 1000 MG tablet Commonly known as: VALTREX TAKE 1 TABLET ONCE DAILY.   Xtampza ER 27 MG C12a Generic drug: oxyCODONE ER Take 2 tablets by mouth every 12 (twelve) hours.               Durable Medical Equipment  (From admission, onward)           Start     Ordered   10/24/21 1350  For home use only DME Walker youth  Once       Question:  Patient needs a walker to treat with the following condition  Answer:  Gait abnormality   10/24/21 1349   10/23/21 1725  For home use only DME 3 n 1  Once        10/23/21 1724            Follow-up Information     Marchia Bond, MD. Schedule an appointment as soon as possible for a visit in 2 week(s).   Specialty: Orthopedic Surgery Contact information: [place] [address] [phone]         Care, [facility] Follow up.   Specialty: Home Health Services Why: Alvis Lemmings will provide PT in the home after discharge. Contact information: [address] [place] [address] [phone]                Allergies   Allergen Reactions   Hydromorphone Nausea Only    Consultations: Orthopedics, Dr. Mardelle Matte   Procedures/Studies: DG Hip Port Unilat With Pelvis 1V Right  Result Date: 10/22/2021 CLINICAL DATA:  Status post surgical internal fixation of right hip fracture. EXAM: DG HIP (WITH OR WITHOUT PELVIS) 1V PORT RIGHT COMPARISON:  October 21, 2021. FINDINGS: Status post surgical internal fixation of proximal right femoral intertrochanteric fracture. Good alignment of fracture components is noted. Expected postoperative changes are noted in the surrounding soft tissues. IMPRESSION: Status post surgical internal fixation of proximal right femoral intertrochanteric fracture. Electronically Signed   By: Marijo Conception M.D.   On: 10/22/2021 17:32   DG C-Arm 1-60 Min-No Report  Result  Date: 10/22/2021 CLINICAL DATA:  Intramedullary rod fixation of right femur. EXAM: RIGHT FEMUR 2 VIEWS; DG C-ARM 1-60 MIN-NO REPORT Radiation exposure index: 8.2057 mGy. COMPARISON:  October 21, 2021. FINDINGS: Five intraoperative fluoroscopic images were obtained the right hip. These images demonstrate surgical internal fixation of intertrochanteric fracture involving proximal right femur. IMPRESSION: Fluoroscopic guidance provided during surgical internal fixation of proximal right femoral intertrochanteric fracture. Electronically Signed   By: Marijo Conception M.D.   On: 10/22/2021 17:17   DG FEMUR, MIN 2 VIEWS RIGHT  Result Date: 10/22/2021 CLINICAL DATA:  Intramedullary rod fixation of right femur. EXAM: RIGHT FEMUR 2 VIEWS; DG C-ARM 1-60 MIN-NO REPORT Radiation exposure index: 8.2057 mGy. COMPARISON:  October 21, 2021. FINDINGS: Five intraoperative fluoroscopic images were obtained the right hip. These images demonstrate surgical internal fixation of intertrochanteric fracture involving proximal right femur. IMPRESSION: Fluoroscopic guidance provided during surgical internal fixation of proximal right femoral intertrochanteric  fracture. Electronically Signed   By: Marijo Conception M.D.   On: 10/22/2021 17:17   DG Femur Min 2 Views Right  Result Date: 10/21/2021 CLINICAL DATA:  Right hip pain after fall EXAM: RIGHT FEMUR 2 VIEWS; PELVIS - 1-2 VIEW COMPARISON:  None Available.  CT abdomen and pelvis 08/31/2014 FINDINGS: Acute mildly displaced intertrochanteric fracture of the right femur with medial displacement of the lesser trochanter. No hip dislocation. No additional fractures. Degenerative changes pubic symphysis, both hips, SI joints and lower lumbar spine. IMPRESSION: Right intertrochanteric femur fracture. Electronically Signed   By: Placido Sou M.D.   On: 10/21/2021 21:40   DG Pelvis 1-2 Views  Result Date: 10/21/2021 CLINICAL DATA:  Right hip pain after fall EXAM: RIGHT FEMUR 2 VIEWS; PELVIS - 1-2 VIEW COMPARISON:  None Available.  CT abdomen and pelvis 08/31/2014 FINDINGS: Acute mildly displaced intertrochanteric fracture of the right femur with medial displacement of the lesser trochanter. No hip dislocation. No additional fractures. Degenerative changes pubic symphysis, both hips, SI joints and lower lumbar spine. IMPRESSION: Right intertrochanteric femur fracture. Electronically Signed   By: Placido Sou M.D.   On: 10/21/2021 21:40     Subjective: Patient seen examined bedside, resting calmly.  Sitting at bedside with husband present.  Ready for discharge home.  No other concerns or complaints at this time.  Denies headache, no dizziness, no chest pain, no palpitations, no shortness of breath, no fever/chills/night sweats, no nausea/vomiting/diarrhea, no focal weakness, no fatigue, no cough/congestion.  No acute events overnight per nursing staff.  Discharge Exam: Vitals:   10/24/21 2014 10/25/21 0431  BP: 98/74 101/67  Pulse: 91 95  Resp: 18 18  Temp: 98.6 F (37 C) 98.4 F (36.9 C)  SpO2: 98% 98%   Vitals:   10/24/21 0517 10/24/21 1323 10/24/21 2014 10/25/21 0431  BP: 94/67 106/77 98/74  101/67  Pulse: 93 (!) 102 91 95  Resp: 18 18 18 18   Temp: 97.7 F (36.5 C) 97.6 F (36.4 C) 98.6 F (37 C) 98.4 F (36.9 C)  TempSrc: Oral Oral Oral Oral  SpO2: 100% 99% 98% 98%  Weight:      Height:        Physical Exam: GEN: NAD, alert and oriented x 3, wd/wn HEENT: NCAT, PERRL, EOMI, sclera clear, MMM PULM: CTAB w/o wheezes/crackles, normal respiratory effort, on room air CV: RRR w/o M/G/R GI: abd soft, NTND, NABS, no R/G/M MSK: no peripheral edema, muscle strength globally intact 5/5 bilateral upper/lower extremities.  Surgical dressings noted in place, no surrounding erythema/fluctuance  or significant ecchymosis, dressings clean/dry/intact NEURO: CN II-XII intact, no focal deficits, sensation to light touch intact PSYCH: normal mood/affect Integumentary: Surgical dressings noted as above, otherwise no other concerning rashes/lesions/wounds dry/intact    The results of significant diagnostics from this hospitalization (including imaging, microbiology, ancillary and laboratory) are listed below for reference.     Microbiology: Recent Results (from the past 240 hour(s))  MRSA Next Gen by PCR, Nasal     Status: None   Collection Time: 10/22/21  4:04 AM   Specimen: Nasal Mucosa; Nasal Swab  Result Value Ref Range Status   MRSA by PCR Next Gen NOT DETECTED NOT DETECTED Final    Comment: (NOTE) The GeneXpert MRSA Assay (FDA approved for NASAL specimens only), is one component of a comprehensive MRSA colonization surveillance program. It is not intended to diagnose MRSA infection nor to guide or monitor treatment for MRSA infections. Test performance is not FDA approved in patients less than 82 years old. Performed at [facility], [place] [address], [place], [place] [address]      Labs: BNP (last 3 results) No results for input(s): "BNP" in the last 8760 hours. Basic Metabolic Panel: Recent Labs  Lab 10/21/21 2230 10/23/21 0526  NA 141 139  K 4.2  3.9  CL 107 106  CO2 27 27  GLUCOSE 102* 148*  BUN 15 11  CREATININE 0.85 0.72  CALCIUM 8.6* 7.8*  MG  --  1.8   Liver Function Tests: Recent Labs  Lab 10/21/21 2230 10/23/21 0526  AST 31 24  ALT 16 15  ALKPHOS 42 32*  BILITOT 0.5 0.3  PROT 5.8* 4.5*  ALBUMIN 4.1 3.0*   No results for input(s): "LIPASE", "AMYLASE" in the last 168 hours. No results for input(s): "AMMONIA" in the last 168 hours. CBC: Recent Labs  Lab 10/21/21 2230 10/23/21 0952 10/24/21 0454 10/25/21 0453  WBC 7.9 5.6 4.1 3.2*  NEUTROABS 7.1 4.4  --   --   HGB 12.3 8.4* 7.8* 7.7*  HCT 35.6* 25.0* 23.0* 23.4*  MCV 106.6* 108.7* 110.6* 111.4*  PLT 106* 98* 85* 99*   Cardiac Enzymes: No results for input(s): "CKTOTAL", "CKMB", "CKMBINDEX", "TROPONINI" in the last 168 hours. BNP: Invalid input(s): "POCBNP" CBG: No results for input(s): "GLUCAP" in the last 168 hours. D-Dimer No results for input(s): "DDIMER" in the last 72 hours. Hgb A1c No results for input(s): "HGBA1C" in the last 72 hours. Lipid Profile No results for input(s): "CHOL", "HDL", "LDLCALC", "TRIG", "CHOLHDL", "LDLDIRECT" in the last 72 hours. Thyroid function studies No results for input(s): "TSH", "T4TOTAL", "T3FREE", "THYROIDAB" in the last 72 hours.  Invalid input(s): "FREET3" Anemia work up Recent Labs    10/24/21 1219  VITAMINB12 396  FOLATE 17.0  FERRITIN 92  TIBC 252  IRON 48  RETICCTPCT 2.5   Urinalysis    Component Value Date/Time   LABSPEC 1.015 06/09/2014 1510   PHURINE 6.0 06/09/2014 1510   GLUCOSEU Negative 06/09/2014 1510   HGBUR Negative 06/09/2014 1510   BILIRUBINUR Negative 06/09/2014 1510   KETONESUR Negative 06/09/2014 1510   PROTEINUR Negative 06/09/2014 1510   UROBILINOGEN 0.2 06/09/2014 1510   NITRITE Negative 06/09/2014 1510   LEUKOCYTESUR Small 06/09/2014 1510   Sepsis Labs Recent Labs  Lab 10/21/21 2230 10/23/21 0952 10/24/21 0454 10/25/21 0453  WBC 7.9 5.6 4.1 3.2*    Microbiology Recent Results (from the past 240 hour(s))  MRSA Next Gen by PCR, Nasal     Status: None  Collection Time: 10/22/21  4:04 AM   Specimen: Nasal Mucosa; Nasal Swab  Result Value Ref Range Status   MRSA by PCR Next Gen NOT DETECTED NOT DETECTED Final    Comment: (NOTE) The GeneXpert MRSA Assay (FDA approved for NASAL specimens only), is one component of a comprehensive MRSA colonization surveillance program. It is not intended to diagnose MRSA infection nor to guide or monitor treatment for MRSA infections. Test performance is not FDA approved in patients less than 18 years old. Performed at [facility], [place] [address] [place], [place] [address]      Time coordinating discharge: Over 30 minutes  SIGNED:   Nashly Olsson J [place] DO  [facility] 10/25/2021, 10:14 AM

## 2021-10-25 NOTE — Progress Notes (Signed)
Subjective: 3 Days Post-Op s/p Procedure(s): INTRAMEDULLARY (IM) NAIL INTERTROCHANTERIC   Patient is alert, oriented, sitting up in bed. Pain so far well controlled, patient happy with her progression with PT so far.  Still struggling to get a good night sleep, hard to get comfortable in bed. Denies lightheadedness or dizziness. No other complaints.     Objective:  PE: VITALS:   Vitals:   10/24/21 0517 10/24/21 1323 10/24/21 2014 10/25/21 0431  BP: 94/67 106/77 98/74 101/67  Pulse: 93 (!) 102 91 95  Resp: '18 18 18 18  '$ Temp: 97.7 F (36.5 C) 97.6 F (36.4 C) 98.6 F (37 C) 98.4 F (36.9 C)  TempSrc: Oral Oral Oral Oral  SpO2: 100% 99% 98% 98%  Weight:      Height:       General: sitting up in bed, in no acute distress Resp: Normal respiratory effort MSK: dressing CDI, mild ecchymosis at distal aspect of proximal bandage, dorsiflexion and plantarflexion intact. Distal sensation intact.  2+ DP pulse  LABS  Results for orders placed or performed during the hospital encounter of 10/21/21 (from the past 24 hour(s))  Vitamin B12     Status: None   Collection Time: 10/24/21 12:19 PM  Result Value Ref Range   Vitamin B-12 396 180 - 914 pg/mL  Folate     Status: None   Collection Time: 10/24/21 12:19 PM  Result Value Ref Range   Folate 17.0 >5.9 ng/mL  Iron and TIBC     Status: None   Collection Time: 10/24/21 12:19 PM  Result Value Ref Range   Iron 48 28 - 170 ug/dL   TIBC 252 250 - 450 ug/dL   Saturation Ratios 19 10.4 - 31.8 %   UIBC 204 ug/dL  Ferritin     Status: None   Collection Time: 10/24/21 12:19 PM  Result Value Ref Range   Ferritin 92 11 - 307 ng/mL  Reticulocytes     Status: Abnormal   Collection Time: 10/24/21 12:19 PM  Result Value Ref Range   Retic Ct Pct 2.5 0.4 - 3.1 %   RBC. 2.22 (L) 3.87 - 5.11 MIL/uL   Retic Count, Absolute 55.1 19.0 - 186.0 K/uL   Immature Retic Fract 21.4 (H) 2.3 - 15.9 %  CBC     Status: Abnormal   Collection Time:  10/25/21  4:53 AM  Result Value Ref Range   WBC 3.2 (L) 4.0 - 10.5 K/uL   RBC 2.10 (L) 3.87 - 5.11 MIL/uL   Hemoglobin 7.7 (L) 12.0 - 15.0 g/dL   HCT 23.4 (L) 36.0 - 46.0 %   MCV 111.4 (H) 80.0 - 100.0 fL   MCH 36.7 (H) 26.0 - 34.0 pg   MCHC 32.9 30.0 - 36.0 g/dL   RDW 12.5 11.5 - 15.5 %   Platelets 99 (L) 150 - 400 K/uL   nRBC 0.0 0.0 - 0.2 %   *Note: Due to a large number of results and/or encounters for the requested time period, some results have not been displayed. A complete set of results can be found in Results Review.    No results found.  Assessment/Plan: Closed intertrochanteric hip fracture  3 Days Post-Op s/p Procedure(s): INTRAMEDULLARY (IM) NAIL INTERTROCHANTERIC  Weightbearing: WBAT RLE Insicional and dressing care: Reinforce dressings as needed VTE prophylaxis: Hbg 7.7 today compared to 7.8 yesterday, patient feels she can give lovenox injections at home.  Follow - up plan: 2 weeks with Dr. Mardelle Matte Dispo:  HHPT has been set up, plan is to discharge home today with close follow up in our office.   Contact information:   Merlene Pulling, Hershal Coria [id number] 8-5  After hours and holidays please check [email or web address] for group call information for [facility] 10/25/2021, 10:32 AM

## 2021-10-25 NOTE — Progress Notes (Signed)
Physical Therapy Treatment Patient Details Name: Jessica Cochran MRN: [id number] DOB: 09/20/1960 Today's Date: 10/25/2021   History of Present Illness 61 year old female with past medical history of kappa light chain relapsed and refractory multiple myeloma who follows with Dr. Alvy Bimler as well as [facility] myeloma clinic status post CAR-T in 2021 at [facility], recurrent varicella-zoster virus infections, gastroesophageal reflux disease who presented to [facility] long hospital emergency department via EMS status post mechanical fall.  Pt s/p Right intertrochanteric IM nail on 10/22/21.    PT Comments    Patient progressing well with mobility and ambulated ~200' with assist form spouse and supervision from therapist. Pt demonstrates excellent recall for safe transfers and gait with RW. She reviewed stair mobility and completed multiple steps with RW and assist/guarding from spouse. Addressed questions in preparation for safe discharge and reviewed HEP handout. Will continue to progress in acute setting. Pt is at safe level for discharge home with assist from spouse.    Recommendations for follow up therapy are one component of a multi-disciplinary discharge planning process, led by the attending physician.  Recommendations may be updated based on patient status, additional functional criteria and insurance authorization.  Follow Up Recommendations  Home health PT     Assistance Recommended at Discharge Intermittent Supervision/Assistance  Patient can return home with the following A little help with walking and/or transfers;A little help with bathing/dressing/bathroom;Help with stairs or ramp for entrance;Assist for transportation;Assistance with cooking/housework   Equipment Recommendations  Rolling walker (2 wheels);BSC/3in1 (youth RW)    Recommendations for Other Services       Precautions / Restrictions Precautions Precautions: Fall Restrictions Weight Bearing Restrictions: No RLE Weight  Bearing: Weight bearing as tolerated     Mobility  Bed Mobility Overal bed mobility: Needs Assistance Bed Mobility: Supine to Sit     Supine to sit: Supervision, HOB elevated     General bed mobility comments: pt able to pivot to EOB and bring Rt LE off side without assist or cues    Transfers Overall transfer level: Needs assistance Equipment used: Rolling walker (2 wheels) Transfers: Sit to/from Stand Sit to Stand: Supervision           General transfer comment: supervision for safety, pt's spouse provided guarding for sit<>stands with supervision from therapist    Ambulation/Gait Ambulation/Gait assistance: Supervision Gait Distance (Feet): 200 Feet Assistive device: Rolling walker (2 wheels) Gait Pattern/deviations: Step-through pattern, Decreased stride length Gait velocity: fair     General Gait Details: pt maintained safe position to RW, pt's spouse provided good guarding throughout with instruction at start from PT. no LOB noted.   Stairs   Stairs assistance: Min guard Stair Management: No rails, Step to pattern, Forwards, With walker Number of Stairs:  (3x2) General stair comments: cues for sequencing forward steps with RW, pt completed 2 bouts, partial wtih rail and walker and then one bout with only RW on 2 steps. Pt's spouse provided safe assist/guarding with supervision/cues from therapist.   Wheelchair Mobility    Modified Rankin (Stroke Patients Only)       Balance Overall balance assessment: Needs assistance Sitting-balance support: Feet supported Sitting balance-Leahy Scale: Good     Standing balance support: During functional activity, Reliant on assistive device for balance, Bilateral upper extremity supported Standing balance-Leahy Scale: Fair                              Cognition Arousal/Alertness: Awake/alert Behavior During  Therapy: WFL for tasks assessed/performed Overall Cognitive Status: Within Functional Limits  for tasks assessed                                          Exercises      General Comments        Pertinent Vitals/Pain Pain Assessment Faces Pain Scale: Hurts a little bit Pain Location: right thigh Pain Descriptors / Indicators: Sore    Home Living                          Prior Function            PT Goals (current goals can now be found in the care plan section) Acute Rehab PT Goals Patient Stated Goal: recover and get home PT Goal Formulation: With patient Time For Goal Achievement: 11/06/21 Potential to Achieve Goals: Good Progress towards PT goals: Progressing toward goals    Frequency    Min 5X/week      PT Plan Current plan remains appropriate    Co-evaluation              AM-PAC PT "6 Clicks" Mobility   Outcome Measure  Help needed turning from your back to your side while in a flat bed without using bedrails?: A Little Help needed moving from lying on your back to sitting on the side of a flat bed without using bedrails?: A Little Help needed moving to and from a bed to a chair (including a wheelchair)?: A Little Help needed standing up from a chair using your arms (e.g., wheelchair or bedside chair)?: A Little Help needed to walk in hospital room?: A Little Help needed climbing 3-5 steps with a railing? : A Little 6 Click Score: 18    End of Session Equipment Utilized During Treatment: Gait belt Activity Tolerance: Patient tolerated treatment well Patient left: in chair;with call bell/phone within reach;with family/visitor present Nurse Communication: Mobility status PT Visit Diagnosis: Difficulty in walking, not elsewhere classified (R26.2);Pain Pain - Right/Left: Right Pain - part of body: Leg     Time: 7867-5449 PT Time Calculation (min) (ACUTE ONLY): 29 min  Charges:  $Gait Training: 23-37 mins                     Verner Mould, DPT Acute Rehabilitation Services Office [phone] Pager  [phone]  10/25/21 12:50 PM

## 2021-10-26 LAB — BPAM RBC
Blood Product Expiration Date: 202309122359
Blood Product Expiration Date: 202309122359
Unit Type and Rh: 5100
Unit Type and Rh: 5100

## 2021-10-26 LAB — TYPE AND SCREEN
ABO/RH(D): O POS
Antibody Screen: NEGATIVE
Unit division: 0
Unit division: 0

## 2023-01-05 ENCOUNTER — Encounter: Payer: Self-pay | Admitting: Hematology and Oncology

## 2023-01-12 ENCOUNTER — Ambulatory Visit (INDEPENDENT_AMBULATORY_CARE_PROVIDER_SITE_OTHER): Payer: 59 | Admitting: Podiatry

## 2023-01-12 ENCOUNTER — Encounter: Payer: Self-pay | Admitting: Podiatry

## 2023-01-12 DIAGNOSIS — C9001 Multiple myeloma in remission: Secondary | ICD-10-CM | POA: Diagnosis not present

## 2023-01-12 DIAGNOSIS — Q828 Other specified congenital malformations of skin: Secondary | ICD-10-CM

## 2023-01-12 DIAGNOSIS — B351 Tinea unguium: Secondary | ICD-10-CM | POA: Diagnosis not present

## 2023-01-12 NOTE — Progress Notes (Signed)
  Subjective:  Patient ID: Jessica Cochran, female    DOB: [birth year],   MRN: [id number]  Chief Complaint  Patient presents with   Nail Problem    Pt presents for thick and discolored toenails.    62 y.o. female presents for concern of thickened discolored and britle nails. She has a history of mulitple myeloma and started a new chemo in January. Relates after that her nails started thickened and getting discolored. She was advised to check it out and possibly trim nails as well as rule out any fungus. Also relates some spots on the bottom of her feet that have been painful   . Denies any other pedal complaints. Denies n/v/f/c.   Past Medical History:  Diagnosis Date   Arthritis    Cough 06/09/2014   Dysuria 06/09/2014   Herpes zoster infection 04/29/2011   Multiple myeloma in relapse (HCC) 10/12/2011   08/19/11: increase in kappa serum free light chains; 7/11: elevation urine protein (157 mg/24 hr) and reappearance monoclonal kappa light chains; 7/18: bone marrow biopsy 4% plasma cells; bone X-rays: no new lesions   Multiple myeloma in remission (HCC) 04/29/2011   Pelvic mass in female 08/28/2014   Renal stone    Yeast infection 06/08/2015    Objective:  Physical Exam: Vascular: DP/PT pulses 2/4 bilateral. CFT <3 seconds. Normal hair growth on digits. No edema.  Skin. No lacerations or abrasions bilateral feet. Nails 1-5 bilateral are thickened elongated and discolored with subungual debris. Hyperkeratotic cored lesions sub second fourth and fifth metatarsals bilateral.  Musculoskeletal: MMT 5/5 bilateral lower extremities in DF, PF, Inversion and Eversion. Deceased ROM in DF of ankle joint.  Neurological: Sensation intact to light touch.   Assessment:   1. Onychomycosis   2. Multiple myeloma in remission (HCC)   3. Porokeratosis      Plan:  Patient was evaluated and treated and all questions answered. -Examined patient -Discussed treatment options for painful dystrophic nails   -Clinical picture and Fungal culture was obtained by removing a portion of the hard nail itself from each of the involved toenails using a sterile nail nipper and sent to [facility] lab. Patient tolerated the biopsy procedure well without discomfort or need for anesthesia.  -Discussed fungal nail treatment options including oral, topical, and laser treatments.  -Hyperkeratotic lesions debrided with chisel without incident as courtesy.  -Patient to return in 4 weeks for follow up evaluation and discussion of fungal culture results or sooner if symptoms worsen.   Louann Sjogren, DPM

## 2023-01-12 NOTE — Addendum Note (Signed)
Addended by: Daryel November on: 01/12/2023 04:50 PM   Modules accepted: Orders

## 2023-01-16 ENCOUNTER — Other Ambulatory Visit: Payer: Self-pay | Admitting: Podiatry

## 2023-02-11 ENCOUNTER — Ambulatory Visit (INDEPENDENT_AMBULATORY_CARE_PROVIDER_SITE_OTHER): Payer: 59 | Admitting: Podiatry

## 2023-02-11 ENCOUNTER — Encounter: Payer: Self-pay | Admitting: Podiatry

## 2023-02-11 DIAGNOSIS — L603 Nail dystrophy: Secondary | ICD-10-CM | POA: Diagnosis not present

## 2023-02-11 NOTE — Progress Notes (Signed)
  Subjective:  Patient ID: Jessica Cochran, female    DOB: Jan 18, 1961,   MRN: [id number]  Chief Complaint  Patient presents with   Nail Problem    Pt presents for a follow up evaluation and discussion of fungal culture results.    62 y.o. female presents for follow-up of nails and to discuss culture results.   . Denies any other pedal complaints. Denies n/v/f/c.   Past Medical History:  Diagnosis Date   Arthritis    Cough 06/09/2014   Dysuria 06/09/2014   Herpes zoster infection 04/29/2011   Multiple myeloma in relapse (HCC) 10/12/2011   08/19/11: increase in kappa serum free light chains; 7/11: elevation urine protein (157 mg/24 hr) and reappearance monoclonal kappa light chains; 7/18: bone marrow biopsy 4% plasma cells; bone X-rays: no new lesions   Multiple myeloma in remission (HCC) 04/29/2011   Pelvic mass in female 08/28/2014   Renal stone    Yeast infection 06/08/2015    Objective:  Physical Exam: Vascular: DP/PT pulses 2/4 bilateral. CFT <3 seconds. Normal hair growth on digits. No edema.  Skin. No lacerations or abrasions bilateral feet. Nails 1-5 bilateral are thickened elongated and discolored with subungual debris. Hyperkeratotic cored lesions sub second fourth and fifth metatarsals bilateral.  Musculoskeletal: MMT 5/5 bilateral lower extremities in DF, PF, Inversion and Eversion. Deceased ROM in DF of ankle joint.  Neurological: Sensation intact to light touch.   Assessment:   1. Onychodystrophy       Plan:  Patient was evaluated and treated and all questions answered. -Examined patient -Discussed treatment options for painful dystrophic nails  -Culture negative for fungus. Just trauma present.  -Discussed treatments including shoe gear modification and mositruizing.  -Patient to return as needed   Louann Sjogren, DPM
